# Patient Record
Sex: Female | Born: 1945 | Race: White | Hispanic: No | Smoking: Never smoker
Health system: Southern US, Community
[De-identification: ages and names within clinical notes are randomized; demographics above are authoritative.]

## PROBLEM LIST (undated history)

## (undated) DIAGNOSIS — Z923 Personal history of irradiation: Secondary | ICD-10-CM

## (undated) DIAGNOSIS — R0600 Dyspnea, unspecified: Secondary | ICD-10-CM

## (undated) DIAGNOSIS — S46129A Laceration of muscle, fascia and tendon of long head of biceps, unspecified arm, initial encounter: Secondary | ICD-10-CM

## (undated) DIAGNOSIS — E785 Hyperlipidemia, unspecified: Secondary | ICD-10-CM

## (undated) DIAGNOSIS — J189 Pneumonia, unspecified organism: Secondary | ICD-10-CM

## (undated) DIAGNOSIS — G7000 Myasthenia gravis without (acute) exacerbation: Secondary | ICD-10-CM

## (undated) DIAGNOSIS — C50919 Malignant neoplasm of unspecified site of unspecified female breast: Secondary | ICD-10-CM

## (undated) DIAGNOSIS — I10 Essential (primary) hypertension: Secondary | ICD-10-CM

## (undated) DIAGNOSIS — M810 Age-related osteoporosis without current pathological fracture: Secondary | ICD-10-CM

## (undated) DIAGNOSIS — R7303 Prediabetes: Secondary | ICD-10-CM

## (undated) DIAGNOSIS — M1990 Unspecified osteoarthritis, unspecified site: Secondary | ICD-10-CM

## (undated) HISTORY — DX: Age-related osteoporosis without current pathological fracture: M81.0

## (undated) HISTORY — PX: OTHER SURGICAL HISTORY: SHX169

## (undated) HISTORY — PX: BREAST SURGERY: SHX581

## (undated) HISTORY — PX: RESECTION OF A THYMOMA: SHX6214

## (undated) HISTORY — DX: Personal history of irradiation: Z92.3

---

## 1993-03-21 HISTORY — PX: MASTECTOMY: SHX3

## 1998-03-21 HISTORY — PX: BACK SURGERY: SHX140

## 2003-03-22 HISTORY — PX: ABDOMINAL HYSTERECTOMY: SHX81

## 2012-10-12 ENCOUNTER — Ambulatory Visit (INDEPENDENT_AMBULATORY_CARE_PROVIDER_SITE_OTHER): Payer: Worker's Compensation | Admitting: Family Medicine

## 2012-10-12 ENCOUNTER — Encounter: Payer: Self-pay | Admitting: Family Medicine

## 2012-10-12 VITALS — BP 139/79 | HR 84 | Ht 59.5 in | Wt 128.0 lb

## 2012-10-12 DIAGNOSIS — M810 Age-related osteoporosis without current pathological fracture: Secondary | ICD-10-CM

## 2012-10-12 DIAGNOSIS — L0291 Cutaneous abscess, unspecified: Secondary | ICD-10-CM

## 2012-10-12 DIAGNOSIS — L0390 Cellulitis, unspecified: Secondary | ICD-10-CM

## 2012-10-12 HISTORY — DX: Age-related osteoporosis without current pathological fracture: M81.0

## 2012-10-12 MED ORDER — SULFAMETHOXAZOLE-TRIMETHOPRIM 800-160 MG PO TABS: ORAL_TABLET | ORAL | Status: AC

## 2012-10-12 NOTE — Progress Notes (Signed)
CC: Faith Massey is a 67 y.o. female is here for Establish Care and Insect Bite   Subjective: HPI:   Pleasant 67 year old here to establish care  This is a workers compensation case pending case number and contact information  Patient complains of left foot pain that has been present ever since July 15. This occurred after she believes she was bit by a spider while at work. She describes a toothache pain that quickly developed within 24 hours after the bite. Pain at that time was severe it is slightly improved ibuprofen. She was started on cephalexin July 16 was taking this twice a day had mild improvement in pain however swelling and redness on the top of her foot did not appreciably get better or worse. She's concerned today that there may be an infection still present. Pain is slightly radiating to the top of her foot to her ankle there is overlying numbness where there is redness and swelling on the top of her foot that she describes as moderate in severity. It is worse with pressing on the clutch and walking, it is improved with rest.  Review of Systems - General ROS: negative for - chills, fever, night sweats, weight gain or weight loss Ophthalmic ROS: negative for - decreased vision Psychological ROS: negative for - anxiety or depression ENT ROS: negative for - hearing change, nasal congestion, tinnitus or allergies Hematological and Lymphatic ROS: negative for - bleeding problems, bruising or swollen lymph nodes Breast ROS: negative Respiratory ROS: no cough, shortness of breath, or wheezing Cardiovascular ROS: no chest pain or dyspnea on exertion Gastrointestinal ROS: no abdominal pain, change in bowel habits, or black or bloody stools Genito-Urinary ROS: negative for - genital discharge, genital ulcers, incontinence or abnormal bleeding from genitals Musculoskeletal ROS: negative for - joint pain or muscle pain other than that described above Neurological ROS: negative for - headaches  or memory loss Dermatological ROS: negative for lumps, mole changes, rash and skin lesion changes other than that described above  Past Medical History  Diagnosis Date  . Osteoporosis 10/12/2012     Family History  Problem Relation Age of Onset  . Stroke Mother   . Hypertension Mother   . Hypertension Father      History  Substance Use Topics  . Smoking status: Never Smoker   . Smokeless tobacco: Never Used  . Alcohol Use: Yes     Objective: Filed Vitals:   10/12/12 1401  BP: 139/79  Pulse: 84    General: Alert and Oriented, No Acute Distress HEENT: Pupils equal, round, reactive to light. Conjunctivae clear.  Moist mucous membranes pharynx unremarkable Lungs: Clear to auscultation bilaterally, no wheezing/ronchi/rales.  Comfortable work of breathing. Good air movement. Cardiac: Regular rate and rhythm. Normal S1/S2.  No murmurs, rubs, nor gallops.   Extremities: On the left foot There is mild edema and erythema on the plantar surface of the foot overlying the distal metatarsals there is also mild erythema and edema in all 5 digits . She has full passive range of motion in all toes extreme flexion of any of the toes causes mild discomfort in the skin overlying the distal metatarsals.  Strong peripheral pulses.  Mental Status: No depression, anxiety, nor agitation. Skin: Warm and dry.  Assessment & Plan: Faith Massey was seen today for establish care and insect bite.  Diagnoses and associated orders for this visit:  Cellulitis - sulfamethoxazole-trimethoprim (SEPTRA DS) 800-160 MG per tablet; One by mouth twice a day for ten days.  Other Orders - UNABLE TO FIND; Med Name: Zyflanmend - Cholecalciferol (VITAMIN D-3) 5000 UNITS TABS; Take 5,000 Units by mouth daily.    Cellulitis: I suspect she has infectious cellulitis likely staph or strep that was resistant to Keflex therefore start Bactrim. We discussed signs and symptoms that would be more suggestive of a developing abscess  or tendon sheath involvement that would require imaging, fortunately I do not think she has progressed to the stage we did discuss signs and symptoms that would require emergency room evaluation over the weekend especially if she's not improving by Sunday afternoon. Encouraged to take ibuprofen 800 mg 3 times a day and to go home and rest for the rest of the day  Return in about 1 week (around 10/19/2012).

## 2012-10-15 ENCOUNTER — Other Ambulatory Visit: Payer: Self-pay | Admitting: Physician Assistant

## 2012-10-15 ENCOUNTER — Ambulatory Visit (INDEPENDENT_AMBULATORY_CARE_PROVIDER_SITE_OTHER): Payer: Worker's Compensation | Admitting: Physician Assistant

## 2012-10-15 ENCOUNTER — Ambulatory Visit (HOSPITAL_BASED_OUTPATIENT_CLINIC_OR_DEPARTMENT_OTHER): Admission: RE | Admit: 2012-10-15 | Payer: Worker's Compensation | Source: Ambulatory Visit

## 2012-10-15 ENCOUNTER — Encounter: Payer: Self-pay | Admitting: Physician Assistant

## 2012-10-15 ENCOUNTER — Ambulatory Visit: Payer: 59

## 2012-10-15 ENCOUNTER — Ambulatory Visit (HOSPITAL_BASED_OUTPATIENT_CLINIC_OR_DEPARTMENT_OTHER)
Admission: RE | Admit: 2012-10-15 | Discharge: 2012-10-15 | Disposition: A | Discharge status: Home / Self Care | Payer: Worker's Compensation | Source: Ambulatory Visit | Attending: Physician Assistant | Admitting: Physician Assistant

## 2012-10-15 VITALS — BP 140/88 | HR 87 | Wt 125.0 lb

## 2012-10-15 DIAGNOSIS — S92322A Displaced fracture of second metatarsal bone, left foot, initial encounter for closed fracture: Secondary | ICD-10-CM

## 2012-10-15 DIAGNOSIS — L02619 Cutaneous abscess of unspecified foot: Secondary | ICD-10-CM | POA: Insufficient documentation

## 2012-10-15 DIAGNOSIS — W57XXXA Bitten or stung by nonvenomous insect and other nonvenomous arthropods, initial encounter: Secondary | ICD-10-CM

## 2012-10-15 DIAGNOSIS — L0390 Cellulitis, unspecified: Secondary | ICD-10-CM

## 2012-10-15 DIAGNOSIS — L03119 Cellulitis of unspecified part of limb: Secondary | ICD-10-CM | POA: Insufficient documentation

## 2012-10-15 DIAGNOSIS — S92309A Fracture of unspecified metatarsal bone(s), unspecified foot, initial encounter for closed fracture: Secondary | ICD-10-CM

## 2012-10-15 DIAGNOSIS — M79609 Pain in unspecified limb: Secondary | ICD-10-CM

## 2012-10-15 DIAGNOSIS — T6391XA Toxic effect of contact with unspecified venomous animal, accidental (unintentional), initial encounter: Secondary | ICD-10-CM | POA: Insufficient documentation

## 2012-10-15 DIAGNOSIS — T63391A Toxic effect of venom of other spider, accidental (unintentional), initial encounter: Secondary | ICD-10-CM | POA: Insufficient documentation

## 2012-10-15 DIAGNOSIS — X58XXXA Exposure to other specified factors, initial encounter: Secondary | ICD-10-CM | POA: Insufficient documentation

## 2012-10-15 DIAGNOSIS — R52 Pain, unspecified: Secondary | ICD-10-CM

## 2012-10-15 DIAGNOSIS — M79672 Pain in left foot: Secondary | ICD-10-CM

## 2012-10-15 DIAGNOSIS — L0291 Cutaneous abscess, unspecified: Secondary | ICD-10-CM

## 2012-10-15 NOTE — Progress Notes (Signed)
  Subjective:    Patient ID: Faith Massey, female    DOB: Nov 24, 1945, 67 y.o.   MRN: [id number]  HPI Patient is a 67 yo female who presents to the clinic to follow up on cellulitis from insect bite. The original insect bite was on the 15th of July. She went to Urgent care and was given keflex. It did not get better and saw Dr. Ivan Anchors on Friday, 3 days ago, He switched antibiotic to Septra. Pt has been on septra for 3 days and feels like redness is getting better but still in significant pain and swelling. She is concerned because it is not getting better. She also is having a lot of pain radiating up left leg. She has been taking some ibuprofen which is helping and septra. She has also tried to keep elevated. She went back to work today and was in so much pain decided to come into the office to get left foot looked at. She feels weak and tired.    Review of Systems     Objective:   Physical Exam  Constitutional: She is oriented to person, place, and time. She appears well-developed and well-nourished.  Cardiovascular: Normal rate, regular rhythm and normal heart sounds.   Pulmonary/Chest: Effort normal and breath sounds normal.  Musculoskeletal:  Left foot mild erythema. Minimal swelling over dorsum of foot. Moderate Tenderness to palpation over 2nd metatarsal. Strength 3/5 to plantar flexion. Strength 5/5 to dorsiflexion.   Per picture of foot on Friday appears to be improving.  Neurological: She is alert and oriented to person, place, and time.  Psychiatric: She has a normal mood and affect. Her behavior is normal.          Assessment & Plan:  Left foot cellulitis/insect bite/left foot pain- concerned because pt is still having so much pain especially with PE. Dr. Ivan Anchors was concerned about an Abscess and infection into tendons. I do think redness and swelling is better and pt does not want to get an MRI. I did convince her to start with xray and CBC with Differential. At this point I do  not want to change antibiotic but I might change my mind if CBC elevated. I did write out of work for next 3 days.   Xray revealed a transverse fracture on second metatarsal mid to distal location but anatomically inline. Pt was called and msg was left to call office and come in to get fitted for post op boot. Wear for 4 weeks and follow up in clinic with PCP. Continue to ice and elevate. Call with question or pain that continues. Finish septra.   Spent 30 minutes with patient and greater than 50 percent of visit spent counseling pt to get imaging to further investigate pain and potential causes if no further imaging done.

## 2012-10-16 ENCOUNTER — Ambulatory Visit (INDEPENDENT_AMBULATORY_CARE_PROVIDER_SITE_OTHER): Payer: Worker's Compensation | Admitting: Sports Medicine

## 2012-10-16 ENCOUNTER — Encounter: Payer: Self-pay | Admitting: Sports Medicine

## 2012-10-16 VITALS — BP 142/79 | HR 79 | Wt 126.0 lb

## 2012-10-16 DIAGNOSIS — M84475A Pathological fracture, left foot, initial encounter for fracture: Secondary | ICD-10-CM | POA: Insufficient documentation

## 2012-10-16 DIAGNOSIS — S92325A Nondisplaced fracture of second metatarsal bone, left foot, initial encounter for closed fracture: Secondary | ICD-10-CM

## 2012-10-16 DIAGNOSIS — M8448XA Pathological fracture, other site, initial encounter for fracture: Secondary | ICD-10-CM

## 2012-10-16 DIAGNOSIS — S92309A Fracture of unspecified metatarsal bone(s), unspecified foot, initial encounter for closed fracture: Secondary | ICD-10-CM

## 2012-10-16 HISTORY — DX: Pathological fracture, left foot, initial encounter for fracture: M84.475A

## 2012-10-16 LAB — CBC WITH DIFFERENTIAL/PLATELET
Hemoglobin: 14.2 g/dL (ref 12.0–15.0)
Lymphocytes Relative: 22 % (ref 12–46)
Lymphs Abs: 2.8 10*3/uL (ref 0.7–4.0)
MCH: 29.8 pg (ref 26.0–34.0)
Monocytes Relative: 7 % (ref 3–12)
Neutro Abs: 8.5 10*3/uL — ABNORMAL HIGH (ref 1.7–7.7)
Neutrophils Relative %: 68 % (ref 43–77)
Platelets: 338 10*3/uL (ref 150–400)
RBC: 4.76 MIL/uL (ref 3.87–5.11)
WBC: 12.4 10*3/uL — ABNORMAL HIGH (ref 4.0–10.5)

## 2012-10-16 LAB — COMPLETE METABOLIC PANEL WITH GFR
ALT: 15 U/L (ref 0–35)
Albumin: 4.7 g/dL (ref 3.5–5.2)
CO2: 27 mEq/L (ref 19–32)
Chloride: 105 mEq/L (ref 96–112)
GFR, Est African American: 65 mL/min
GFR, Est Non African American: 57 mL/min — ABNORMAL LOW
Glucose, Bld: 96 mg/dL (ref 70–99)
Potassium: 5.6 mEq/L — ABNORMAL HIGH (ref 3.5–5.3)
Sodium: 142 mEq/L (ref 135–145)
Total Bilirubin: 0.4 mg/dL (ref 0.3–1.2)
Total Protein: 7.2 g/dL (ref 6.0–8.3)

## 2012-10-16 MED ORDER — HYDROCODONE-ACETAMINOPHEN 5-325 MG PO TABS: 0.5000 | ORAL_TABLET | Freq: Three times a day (TID) | ORAL | Status: DC | PRN

## 2012-10-16 NOTE — Progress Notes (Signed)
   Subjective:    I'm seeing this patient as a consultation for:  Dr. Laren Boom and Tandy Gaw, PA-C  CC: Foot fracture  HPI: This is a very pleasant 67 year old female with a history of osteoporosis who has been very resistant to starting a bisphosphonate. Unfortunately she had an insect bite over the dorsum of her left foot, she had persistent pain, x-rays eventually showed a fracture through the shaft of the second metatarsal, this occurred without trauma. She came to see me to discuss the fracture and for definitive treatment. Pain is localized, doesn't radiate, moderate. She is able to bear weight without pain.  Past medical history, Surgical history, Family history not pertinant except as noted below, Social history, Allergies, and medications have been entered into the medical record, reviewed, and no changes needed.   Review of Systems: No headache, visual changes, nausea, vomiting, diarrhea, constipation, dizziness, abdominal pain, skin rash, fevers, chills, night sweats, weight loss, swollen lymph nodes, body aches, joint swelling, muscle aches, chest pain, shortness of breath, mood changes, visual or auditory hallucinations.   Objective:   General: Well Developed, well nourished, and in no acute distress.  Neuro/Psych: Alert and oriented x3, extra-ocular muscles intact, able to move all 4 extremities, sensation grossly intact. Skin: Warm and dry, no rashes noted.  Respiratory: Not using accessory muscles, speaking in full sentences, trachea midline.  Cardiovascular: Pulses palpable, no extremity edema. Abdomen: Does not appear distended. Left foot: There is tenderness to palpation over the mid second metatarsal shaft with palpable bony callus. She is neurovascularly intact distally.  Foot was strapped with compressive dressing.  X-rays are reviewed and show a nondisplaced, non-angulated fracture through the shaft of the second metatarsal.  Impression and Recommendations:     This case required medical decision making of moderate complexity.

## 2012-10-16 NOTE — Assessment & Plan Note (Signed)
This is a nontraumatic fracture likely related to concurrent osteoporosis. She is resistant to any bisphosphonate treatment, we certainly could consider Prolia. I would like her primary care provider to consider this in her treatment. Foot was strapped with compressive dressing, postop shoe. Low-dose hydrocodone. Return in 2 weeks, x-ray before visit.  I billed a fracture code for this visit, all subsequent visits for this complaint will be "post-op checks" in the global period.

## 2012-10-18 ENCOUNTER — Telehealth: Payer: Self-pay | Admitting: *Deleted

## 2012-10-18 NOTE — Telephone Encounter (Signed)
Pt called and states she has an overall general malaise feeling. She was very vague about her sxs. Pt is supposed to come in tomorrow to recheck her Potassium. Advised her that since her white count was a little elevated perhaps her body is still fighting an infection. She  could be  be experiencing the overall bad feeling if potassium is elevated as well.Advised to keep lab appt and once results come back will let her know what to do

## 2012-10-19 ENCOUNTER — Telehealth: Payer: Self-pay | Admitting: *Deleted

## 2012-10-19 DIAGNOSIS — L0291 Cutaneous abscess, unspecified: Secondary | ICD-10-CM

## 2012-10-19 DIAGNOSIS — L0390 Cellulitis, unspecified: Secondary | ICD-10-CM

## 2012-10-19 NOTE — Telephone Encounter (Signed)
labs

## 2012-10-19 NOTE — Telephone Encounter (Signed)
Yes. Need to check potassium.

## 2012-10-19 NOTE — Telephone Encounter (Signed)
Lab order was sent down earlier today

## 2012-10-20 LAB — COMPLETE METABOLIC PANEL WITH GFR
ALT: 14 U/L (ref 0–35)
CO2: 25 mEq/L (ref 19–32)
Chloride: 101 mEq/L (ref 96–112)
GFR, Est African American: 71 mL/min
Sodium: 137 mEq/L (ref 135–145)
Total Bilirubin: 0.4 mg/dL (ref 0.3–1.2)
Total Protein: 7.3 g/dL (ref 6.0–8.3)

## 2012-10-22 NOTE — Addendum Note (Signed)
Addended by: Monica Becton on: 10/22/2012 06:11 PM   Modules accepted: Level of Service

## 2012-10-30 ENCOUNTER — Ambulatory Visit (INDEPENDENT_AMBULATORY_CARE_PROVIDER_SITE_OTHER): Payer: 59 | Admitting: Sports Medicine

## 2012-10-30 ENCOUNTER — Encounter: Payer: Self-pay | Admitting: Sports Medicine

## 2012-10-30 ENCOUNTER — Ambulatory Visit (HOSPITAL_BASED_OUTPATIENT_CLINIC_OR_DEPARTMENT_OTHER)
Admission: RE | Admit: 2012-10-30 | Discharge: 2012-10-30 | Disposition: A | Discharge status: Home / Self Care | Payer: Worker's Compensation | Source: Ambulatory Visit | Attending: Physician Assistant | Admitting: Physician Assistant

## 2012-10-30 ENCOUNTER — Other Ambulatory Visit: Payer: Self-pay | Admitting: Physician Assistant

## 2012-10-30 ENCOUNTER — Ambulatory Visit (HOSPITAL_BASED_OUTPATIENT_CLINIC_OR_DEPARTMENT_OTHER): Admission: RE | Admit: 2012-10-30 | Payer: Worker's Compensation | Source: Ambulatory Visit

## 2012-10-30 VITALS — BP 126/73 | HR 82 | Wt 126.0 lb

## 2012-10-30 DIAGNOSIS — M810 Age-related osteoporosis without current pathological fracture: Secondary | ICD-10-CM

## 2012-10-30 DIAGNOSIS — L0291 Cutaneous abscess, unspecified: Secondary | ICD-10-CM

## 2012-10-30 DIAGNOSIS — L02619 Cutaneous abscess of unspecified foot: Secondary | ICD-10-CM | POA: Insufficient documentation

## 2012-10-30 DIAGNOSIS — M84475A Pathological fracture, left foot, initial encounter for fracture: Secondary | ICD-10-CM

## 2012-10-30 DIAGNOSIS — M8448XA Pathological fracture, other site, initial encounter for fracture: Secondary | ICD-10-CM

## 2012-10-30 DIAGNOSIS — X58XXXA Exposure to other specified factors, initial encounter: Secondary | ICD-10-CM | POA: Insufficient documentation

## 2012-10-30 DIAGNOSIS — S92309A Fracture of unspecified metatarsal bone(s), unspecified foot, initial encounter for closed fracture: Secondary | ICD-10-CM | POA: Insufficient documentation

## 2012-10-30 NOTE — Progress Notes (Signed)
  Subjective: 2 a half weeks status post minimally displaced fracture of the second metatarsal bone. Pain-free for the most part. She has been in a postop shoe, has a little difficulty operating the clutch.   Objective: General: Well-developed, well-nourished, and in no acute distress. Foot looks good, only minimal tenderness.  X-rays were reviewed and show excellent bony callus formation.  Assessment/plan:

## 2012-10-30 NOTE — Assessment & Plan Note (Signed)
She will discuss Prolia with her primary care provider. She is resistant to bisphosphonates due to a family member having a bad reaction.

## 2012-10-30 NOTE — Assessment & Plan Note (Signed)
Fractures healing well. I think she needs at least an additional 2 weeks and the postop shoe. Return to see me in 2 weeks, x-ray is not needed.

## 2012-11-14 ENCOUNTER — Encounter: Payer: Self-pay | Admitting: Sports Medicine

## 2012-11-14 ENCOUNTER — Ambulatory Visit: Payer: Worker's Compensation | Admitting: Sports Medicine

## 2012-11-14 VITALS — BP 146/92 | HR 89 | Wt 124.0 lb

## 2012-11-14 DIAGNOSIS — M84475A Pathological fracture, left foot, initial encounter for fracture: Secondary | ICD-10-CM

## 2012-11-14 NOTE — Assessment & Plan Note (Signed)
Clinically healed five-week status post fracture. Osteoporosis treatment per primary care provider. Return as needed.

## 2012-11-14 NOTE — Progress Notes (Signed)
  Subjective: Five-week status post pathologic fracture of the second metatarsal bone of the left foot, pain free.  This did occur with a misstep while at work.   Objective: General: Well-developed, well-nourished, and in no acute distress. Foot looks good, bony callus is palpable, no pain over the fracture site. Good movement, good sensation.  Assessment/plan:

## 2012-12-19 ENCOUNTER — Ambulatory Visit (INDEPENDENT_AMBULATORY_CARE_PROVIDER_SITE_OTHER): Payer: Worker's Compensation

## 2012-12-19 ENCOUNTER — Encounter: Payer: Self-pay | Admitting: Family Medicine

## 2012-12-19 ENCOUNTER — Ambulatory Visit (INDEPENDENT_AMBULATORY_CARE_PROVIDER_SITE_OTHER): Payer: Worker's Compensation | Admitting: Family Medicine

## 2012-12-19 VITALS — BP 135/91 | HR 86 | Wt 128.0 lb

## 2012-12-19 DIAGNOSIS — L0390 Cellulitis, unspecified: Secondary | ICD-10-CM

## 2012-12-19 DIAGNOSIS — M79672 Pain in left foot: Secondary | ICD-10-CM

## 2012-12-19 DIAGNOSIS — L0291 Cutaneous abscess, unspecified: Secondary | ICD-10-CM

## 2012-12-19 DIAGNOSIS — M79609 Pain in unspecified limb: Secondary | ICD-10-CM

## 2012-12-19 DIAGNOSIS — IMO0001 Reserved for inherently not codable concepts without codable children: Secondary | ICD-10-CM

## 2012-12-19 MED ORDER — DOXYCYCLINE HYCLATE 100 MG PO TABS: ORAL_TABLET | ORAL | Status: AC

## 2012-12-19 NOTE — Progress Notes (Signed)
CC: [name] is a 67 y.o. female is here for concerned about cellulitis in left foot   Subjective: HPI:  Patient complains of worsening left foot pain and swelling with redness on the dorsal aspect of the foot. Pain is described as absent at rest however moderate when flexing the foot or bearing weight. She localizes pain to the same site where she sustained a fracture last month while at work. She is most concerned about the swelling and redness that has been present since taking off her immobilizer last month and is now worsening on a weekly basis. She describes it is warm to the touch and moderately swollen all hours of the day. Nothing particularly makes it better or worse she denies recent trauma or overexertion. She denies fevers, chills, nausea, vomiting ankle pain nor plantar pain.   Review Of Systems Outlined In HPI  Past Medical History  Diagnosis Date  . Osteoporosis 10/12/2012     Family History  Problem Relation Age of Onset  . Stroke Mother   . Hypertension Mother   . Hypertension Father      History  Substance Use Topics  . Smoking status: Never Smoker   . Smokeless tobacco: Never Used  . Alcohol Use: Yes     Objective: Filed Vitals:   12/19/12 1332  BP: 135/91  Pulse: 86    General: Alert and Oriented, No Acute Distress HEENT: Pupils equal, round, reactive to light. Conjunctivae clear.  Moist mucous membranes Cardiac: Regular rate and rhythm. Normal S1/S2.  No murmurs, rubs, nor gallops.   Extremities:  Strong peripheral pulses. Overlying the midshaft of the second and third metatarsal on the left foot there is mild swelling and redness slightly warm to the touch pain is reproduced with palpating the dorsal midshaft of the third metatarsal, no pain in the toe box,  No pain with palpaton elsewhere in the left foot Mental Status: No depression, anxiety, nor agitation.   Assessment & Plan: Faith Massey was seen today for concerned about cellulitis in left  foot.  Diagnoses and associated orders for this visit:  Cellulitis - doxycycline (VIBRA-TABS) 100 MG tablet; One by mouth twice a day for ten days. - CBC w/Diff  Left foot pain - DG Foot Complete Left; Future    Left foot pain: Patient is quite concerned that cellulitis has returned I have a low to moderate suspicion given her presentation and no puncture to the skin, we will empirically start on doxycycline and if white count is not elevated will encourage her to stop. There was concern that there could be worsening of her healing fracture or a new third metatarsal fracture, fortunately this was ruled out on plain films today. If white count is normal we'll advise using a postop shoe to help with continued healing  Return if symptoms worsen or fail to improve.

## 2012-12-20 LAB — CBC WITH DIFFERENTIAL/PLATELET
Basophils Relative: 0 % (ref 0–1)
Eosinophils Absolute: 0.2 10*3/uL (ref 0.0–0.7)
Eosinophils Relative: 2 % (ref 0–5)
Hemoglobin: 13.1 g/dL (ref 12.0–15.0)
MCH: 29.6 pg (ref 26.0–34.0)
MCHC: 33.2 g/dL (ref 30.0–36.0)
Monocytes Absolute: 0.8 10*3/uL (ref 0.1–1.0)
Monocytes Relative: 8 % (ref 3–12)
Neutrophils Relative %: 66 % (ref 43–77)

## 2013-01-11 ENCOUNTER — Encounter: Payer: Self-pay | Admitting: Sports Medicine

## 2013-01-11 ENCOUNTER — Ambulatory Visit: Payer: Self-pay | Admitting: Sports Medicine

## 2013-01-11 ENCOUNTER — Ambulatory Visit (INDEPENDENT_AMBULATORY_CARE_PROVIDER_SITE_OTHER): Payer: Worker's Compensation | Admitting: Sports Medicine

## 2013-01-11 VITALS — BP 150/75 | HR 67

## 2013-01-11 DIAGNOSIS — M84475A Pathological fracture, left foot, initial encounter for fracture: Secondary | ICD-10-CM

## 2013-01-11 DIAGNOSIS — M8448XA Pathological fracture, other site, initial encounter for fracture: Secondary | ICD-10-CM

## 2013-01-11 NOTE — Progress Notes (Signed)
  Subjective:    CC: Follow up  HPI: Faith Massey is a very pleasant 67 year old female, however treating her in the past for second metatarsal shaft fracture of the left foot. She has also had what sounds to be multiple episodes of cellulitis treated with multiple rounds of antibiotics. With antibiotics the swelling resolves, but she continues to have mild pain over the dorsum of the foot, previously was over the second metatarsal shaft, today he localizes it mostly over the third metatarsal shaft. She did have an x-ray recently the results of which will be dictated below. Her main concern is wondering whether she should pursue advanced imaging now, or wait.  Past medical history, Surgical history, Family history not pertinant except as noted below, Social history, Allergies, and medications have been entered into the medical record, reviewed, and no changes needed.   Review of Systems: No fevers, chills, night sweats, weight loss, chest pain, or shortness of breath.   Objective:    General: Well Developed, well nourished, and in no acute distress.  Neuro: Alert and oriented x3, extra-ocular muscles intact, sensation grossly intact.  HEENT: Normocephalic, atraumatic, pupils equal round reactive to light, neck supple, no masses, no lymphadenopathy, thyroid nonpalpable.  Skin: Warm and dry, no rashes. Cardiac: Regular rate and rhythm, no murmurs rubs or gallops, no lower extremity edema.  Respiratory: Clear to auscultation bilaterally. Not using accessory muscles, speaking in full sentences. Left Foot: There is visible swelling over the dorsum of the midfoot, no erythema or induration. Range of motion is full in all directions. Strength is 5/5 in all directions. No hallux valgus. No pes cavus or pes planus. No abnormal callus noted. No pain over the navicular prominence, or base of fifth metatarsal. No tenderness to palpation of the calcaneal insertion of plantar fascia. No pain at the Achilles  insertion. No pain over the calcaneal bursa. No pain of the retrocalcaneal bursa. Minimal tenderness to palpation over the third metatarsal shaft, probable callus is present over the second metatarsal shaft there is no tenderness to palpation here. No hallux rigidus or limitus. No tenderness palpation over interphalangeal joints. No pain with compression of the metatarsal heads. Neurovascularly intact distally.  X-rays were reviewed and show excellent healing of the second metatarsal shaft fracture.  Impression and Recommendations:

## 2013-01-11 NOTE — Assessment & Plan Note (Signed)
Faith Massey is now several months status post fracture of the second metatarsal bone, excellent radiographic healing. Unfortunately she continues to have swelling over the dorsum of the foot with pain predominantly over the third metatarsal shaft. I do not think that the swelling is significantly abnormal, and I really don't think it represents a cellulitis. I did advise that she gets more time, at least another month, and if pain is persistent we should certainly consider CT of the foot with IV contrast to further delineate any collections that may be interfering with resolution with antibiotics, versus persistent or new bony injury of the third metatarsal shaft. At that point I would certainly also consider cast or cam boot immobilization for a month. She can come back to see [place] in one month, now we have a plan.

## 2013-02-08 ENCOUNTER — Ambulatory Visit: Payer: Self-pay | Admitting: Sports Medicine

## 2013-03-29 ENCOUNTER — Ambulatory Visit (INDEPENDENT_AMBULATORY_CARE_PROVIDER_SITE_OTHER): Payer: BC Managed Care – PPO | Admitting: Family Medicine

## 2013-03-29 ENCOUNTER — Encounter: Payer: Self-pay | Admitting: Family Medicine

## 2013-03-29 VITALS — BP 139/80 | HR 118 | Temp 99.4°F | Wt 125.0 lb

## 2013-03-29 DIAGNOSIS — A499 Bacterial infection, unspecified: Secondary | ICD-10-CM

## 2013-03-29 DIAGNOSIS — J329 Chronic sinusitis, unspecified: Secondary | ICD-10-CM

## 2013-03-29 DIAGNOSIS — B9689 Other specified bacterial agents as the cause of diseases classified elsewhere: Secondary | ICD-10-CM

## 2013-03-29 MED ORDER — AMOXICILLIN-POT CLAVULANATE 500-125 MG PO TABS: ORAL_TABLET | ORAL | Status: AC

## 2013-03-29 NOTE — Progress Notes (Signed)
CC: Faith Massey is a 68 y.o. female is here for Nasal Congestion   Subjective: HPI:  Patient claims one week of nasal congestion fatigue nonproductive cough and facial pressure beneath both eyes mostly in the left cheek radiated into the left upper molars. All the symptoms have been persistent worse in the morning slightly improved in the afternoon and overall moderate in severity. She's had a fever the past 2-3 days with a maximum temperature of 100.8. Has been using over-the-counter cold medication without much benefit. She also endorses diffuse body aches moderate in severity.  Denies confusion, motor sensory disturbances, chest pain, shortness of breath, nausea, vomiting nor rash or dysphagia   Review Of Systems Outlined In HPI  Past Medical History  Diagnosis Date  . Osteoporosis 10/12/2012     Family History  Problem Relation Age of Onset  . Stroke Mother   . Hypertension Mother   . Hypertension Father      History  Substance Use Topics  . Smoking status: Never Smoker   . Smokeless tobacco: Never Used  . Alcohol Use: Yes     Objective: Filed Vitals:   03/29/13 1437  BP: 139/80  Pulse: 118  Temp: 99.4 F (37.4 C)    General: Alert and Oriented, No Acute Distress however appears mildly fatigued HEENT: Pupils equal, round, reactive to light. Conjunctivae clear.  External ears unremarkable, canals clear with intact TMs with appropriate landmarks.  Middle ear appears open without effusion. Pink inferior turbinates.  Moist mucous membranes, pharynx without inflammation nor lesions.  Neck supple without palpable lymphadenopathy nor abnormal masses. Lungs: Clear to auscultation bilaterally, no wheezing/ronchi/rales.  Comfortable work of breathing. Good air movement. Cardiac: Regular rate and rhythm. Normal S1/S2.  No murmurs, rubs, nor gallops.   Mental Status: No depression, anxiety, nor agitation. Skin: Warm and dry.  Assessment & Plan: Faith Massey was seen today for nasal  congestion.  Diagnoses and associated orders for this visit:  Bacterial sinusitis - amoxicillin-clavulanate (AUGMENTIN) 500-125 MG per tablet; Take one by mouth every 8 hours for ten total days.    Bacterial sinusitis start Augmentin consider Alka-Seltzer cold and sinus and nasal saline washes for symptom control.  Return if symptoms worsen or fail to improve.

## 2013-05-06 ENCOUNTER — Ambulatory Visit (INDEPENDENT_AMBULATORY_CARE_PROVIDER_SITE_OTHER): Payer: BC Managed Care – PPO

## 2013-05-06 ENCOUNTER — Encounter: Payer: Self-pay | Admitting: Physician Assistant

## 2013-05-06 ENCOUNTER — Ambulatory Visit (INDEPENDENT_AMBULATORY_CARE_PROVIDER_SITE_OTHER): Payer: BC Managed Care – PPO | Admitting: Physician Assistant

## 2013-05-06 VITALS — BP 136/77 | HR 118 | Temp 99.0°F | Wt 124.0 lb

## 2013-05-06 DIAGNOSIS — R6889 Other general symptoms and signs: Secondary | ICD-10-CM

## 2013-05-06 DIAGNOSIS — R059 Cough, unspecified: Secondary | ICD-10-CM

## 2013-05-06 DIAGNOSIS — R05 Cough: Secondary | ICD-10-CM

## 2013-05-06 DIAGNOSIS — R509 Fever, unspecified: Secondary | ICD-10-CM

## 2013-05-06 DIAGNOSIS — J111 Influenza due to unidentified influenza virus with other respiratory manifestations: Secondary | ICD-10-CM

## 2013-05-06 DIAGNOSIS — R69 Illness, unspecified: Principal | ICD-10-CM

## 2013-05-06 DIAGNOSIS — R52 Pain, unspecified: Secondary | ICD-10-CM

## 2013-05-06 LAB — BASIC METABOLIC PANEL WITH GFR
BUN: 9 mg/dL (ref 6–23)
CHLORIDE: 102 meq/L (ref 96–112)
CO2: 25 mEq/L (ref 19–32)
Calcium: 9.1 mg/dL (ref 8.4–10.5)
Creat: 0.61 mg/dL (ref 0.50–1.10)
GFR, Est Non African American: >89 mL/min
GLUCOSE: 108 mg/dL — AB (ref 70–99)
POTASSIUM: 3.7 meq/L (ref 3.5–5.3)
SODIUM: 138 meq/L (ref 135–145)

## 2013-05-06 LAB — POCT INFLUENZA A/B
INFLUENZA B, POC: NEGATIVE
Influenza A, POC: NEGATIVE

## 2013-05-06 MED ORDER — HYDROCODONE-HOMATROPINE 5-1.5 MG/5ML PO SYRP: 5.0000 mL | ORAL_SOLUTION | Freq: Every evening | ORAL | Status: DC | PRN

## 2013-05-06 NOTE — Progress Notes (Signed)
   Subjective:    Patient ID: Faith Massey, female    DOB: Dec 01, 1945, 68 y.o.   MRN: [id number]  HPI Pt is a 68 yo female who presents to the clinic with 3 days of symptoms that started suddenly Saturday night. Coughing is not productive. She is running a temperature of 101 over past 2 days. Her whole body aches especially her upper right shoulder/scapula. She denies any wheezing or SOB. She does have a headache and sinus pressure, sore throat. No ear pain, nausea, diarrhea. She has not vomited. She is very weak and has chills off and on. Pt has no flu exposure. She already had flu once this year.    Review of Systems     Objective:   Physical Exam  Constitutional: She is oriented to person, place, and time. She appears well-developed and well-nourished.  Laying on exam table when came into room.   HENT:  Head: Normocephalic and atraumatic.  Right Ear: External ear normal.  Left Ear: External ear normal.  Nose: Nose normal.  Mouth/Throat: Oropharynx is clear and moist.  Continues to have moist mucosa.   TM's clear bilaterally.   Eyes: Conjunctivae are normal. Right eye exhibits no discharge. Left eye exhibits no discharge.  Neck: Normal range of motion. Neck supple.  Cardiovascular: Regular rhythm and normal heart sounds.   Tachycardia 118.   Pulmonary/Chest: Effort normal and breath sounds normal. She has no wheezes.  Abdominal: Soft. Bowel sounds are normal. There is no tenderness.  Lymphadenopathy:    She has no cervical adenopathy.  Neurological: She is alert and oriented to person, place, and time.  Skin:  Flushed cheeks.   Psychiatric: Her behavior is normal.          Assessment & Plan:  Influenza like illness- Influenza was negative. Pt exhibits many signs of the flu and looks like the picture of flu. I do think illness is viral. I would like to check BMP and CXR to make sure electrolytes are good and no pneumonia. Lung exam was good. Gave hycodan for cough. Encouraged  zyrtec D and mucinex. Concerned about dehydration encouraged pushing fluids. Tylenol and Advil for aches and pain. Call if worsening or not improving. REST and hydration for next 3 days. Wrote out of work for today and 2 more days.

## 2013-05-06 NOTE — Patient Instructions (Addendum)
Push fluids.  Will call with CXR and BMP results.  Zyrtec D and Mucinex.  Tylenol and ibuprofen.   Influenza, Adult Influenza ("the flu") is a viral infection of the respiratory tract. It occurs more often in winter months because people spend more time in close contact with one another. Influenza can make you feel very sick. Influenza easily spreads from person to person (contagious). CAUSES  Influenza is caused by a virus that infects the respiratory tract. You can catch the virus by breathing in droplets from an infected person's cough or sneeze. You can also catch the virus by touching something that was recently contaminated with the virus and then touching your mouth, nose, or eyes. SYMPTOMS  Symptoms typically last 4 to 10 days and may include:  Fever.  Chills.  Headache, body aches, and muscle aches.  Sore throat.  Chest discomfort and cough.  Poor appetite.  Weakness or feeling tired.  Dizziness.  Nausea or vomiting. DIAGNOSIS  Diagnosis of influenza is often made based on your history and a physical exam. A nose or throat swab test can be done to confirm the diagnosis. RISKS AND COMPLICATIONS You may be at risk for a more severe case of influenza if you smoke cigarettes, have diabetes, have chronic heart disease (such as heart failure) or lung disease (such as asthma), or if you have a weakened immune system. Elderly people and pregnant women are also at risk for more serious infections. The most common complication of influenza is a lung infection (pneumonia). Sometimes, this complication can require emergency medical care and may be life-threatening. PREVENTION  An annual influenza vaccination (flu shot) is the best way to avoid getting influenza. An annual flu shot is now routinely recommended for all adults in the [place] TREATMENT  In mild cases, influenza goes away on its own. Treatment is directed at relieving symptoms. For more severe cases, your caregiver may  prescribe antiviral medicines to shorten the sickness. Antibiotic medicines are not effective, because the infection is caused by a virus, not by bacteria. HOME CARE INSTRUCTIONS  Only take over-the-counter or prescription medicines for pain, discomfort, or fever as directed by your caregiver.  Use a cool mist humidifier to make breathing easier.  Get plenty of rest until your temperature returns to normal. This usually takes 3 to 4 days.  Drink enough fluids to keep your urine clear or pale yellow.  Cover your mouth and nose when coughing or sneezing, and wash your hands well to avoid spreading the virus.  Stay home from work or school until your fever has been gone for at least 1 full day. SEEK MEDICAL CARE IF:   You have chest pain or a deep cough that worsens or produces more mucus.  You have nausea, vomiting, or diarrhea. SEEK IMMEDIATE MEDICAL CARE IF:   You have difficulty breathing, shortness of breath, or your skin or nails turn bluish.  You have severe neck pain or stiffness.  You have a severe headache, facial pain, or earache.  You have a worsening or recurring fever.  You have nausea or vomiting that cannot be controlled. MAKE SURE YOU:  Understand these instructions.  Will watch your condition.  Will get help right away if you are not doing well or get worse. Document Released: 03/04/2000 Document Revised: 09/06/2011 Document Reviewed: 06/06/2011 [facility] Patient Information 2014 [place], [place].

## 2013-05-08 ENCOUNTER — Encounter: Payer: Self-pay | Admitting: *Deleted

## 2013-05-08 ENCOUNTER — Other Ambulatory Visit: Payer: Self-pay | Admitting: Physician Assistant

## 2013-05-08 MED ORDER — AZITHROMYCIN 250 MG PO TABS: ORAL_TABLET | ORAL | Status: DC

## 2013-05-08 MED ORDER — BENZONATATE 200 MG PO CAPS: 200.0000 mg | ORAL_CAPSULE | Freq: Two times a day (BID) | ORAL | Status: DC | PRN

## 2013-05-10 ENCOUNTER — Encounter: Payer: Self-pay | Admitting: *Deleted

## 2014-07-28 IMAGING — CR DG FOOT COMPLETE 3+V*L*
3 series · 3 of 3 positions shown · non-contrast
Comparison: 10/15/2012

CLINICAL DATA: Cellulitis.  Abscess.

LEFT FOOT - COMPLETE 3+ VIEW

[t foot ap left]
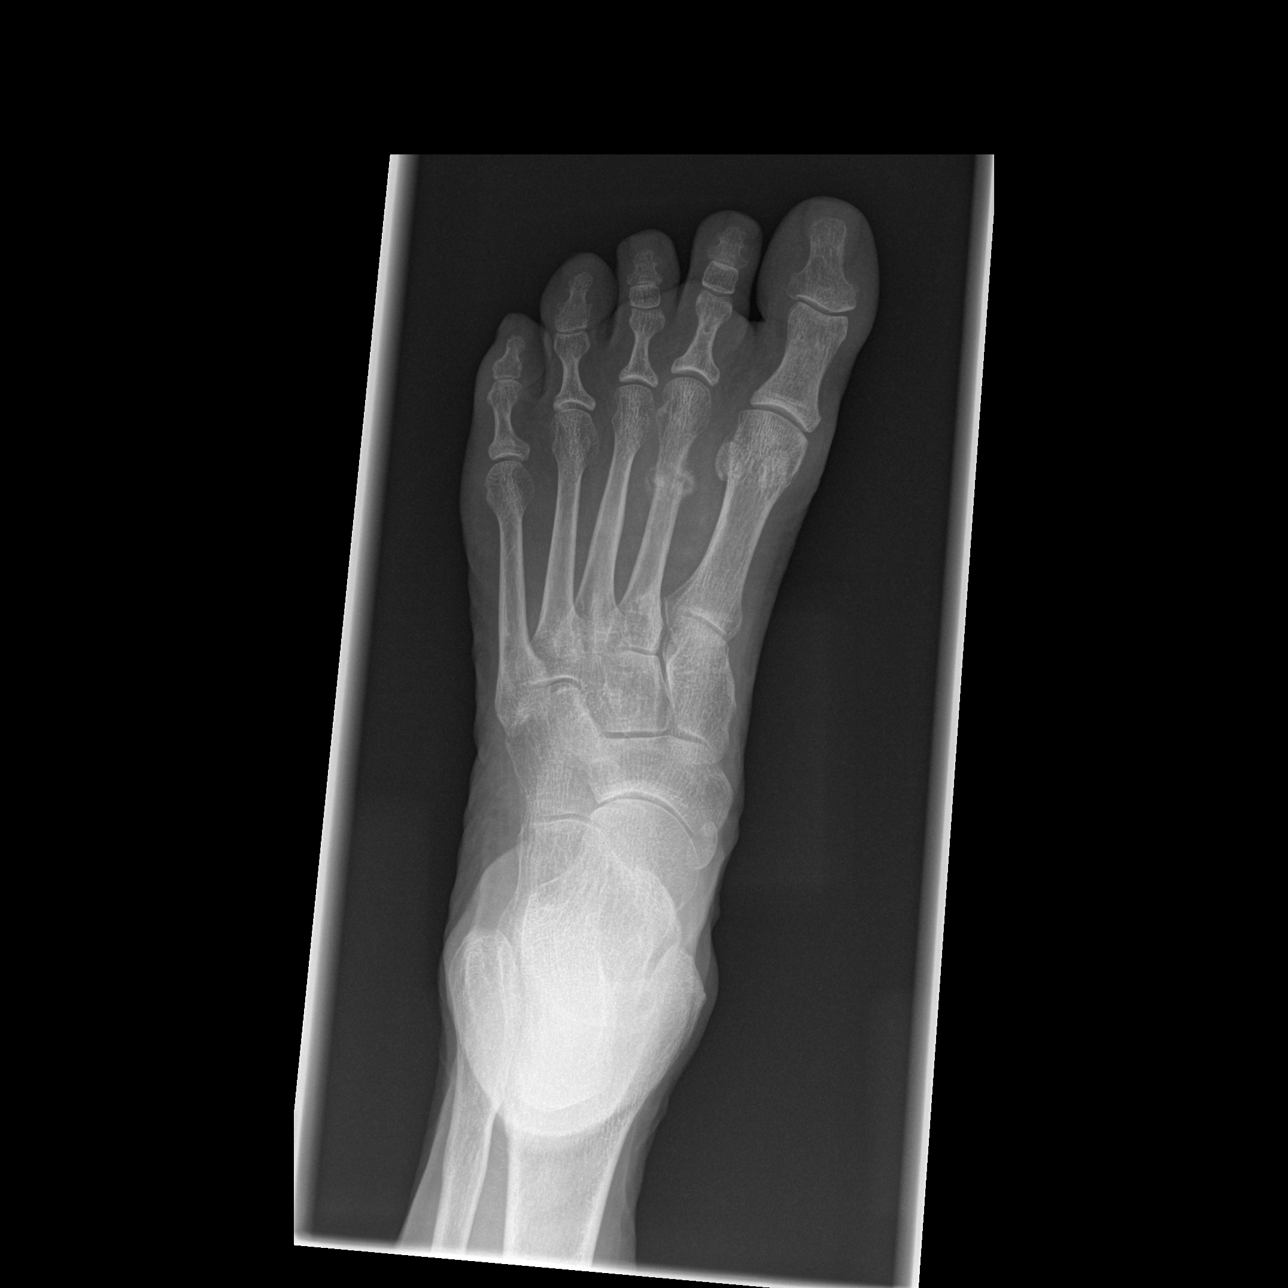

[t foot oblique left]
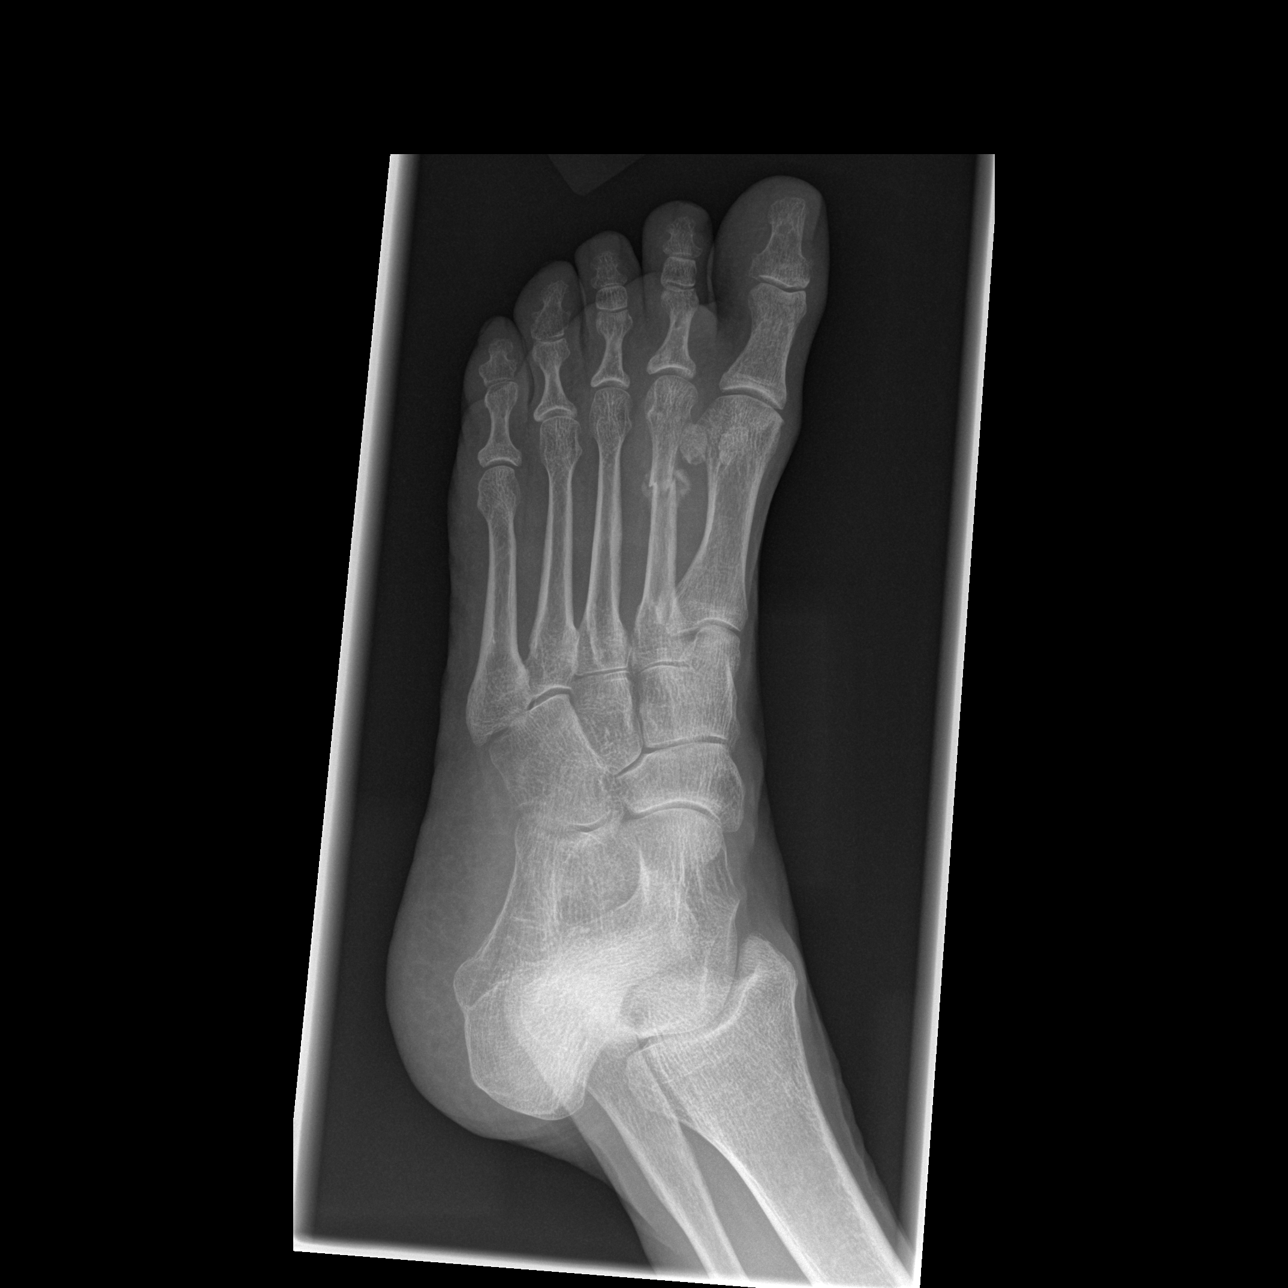

[t foot lat left]
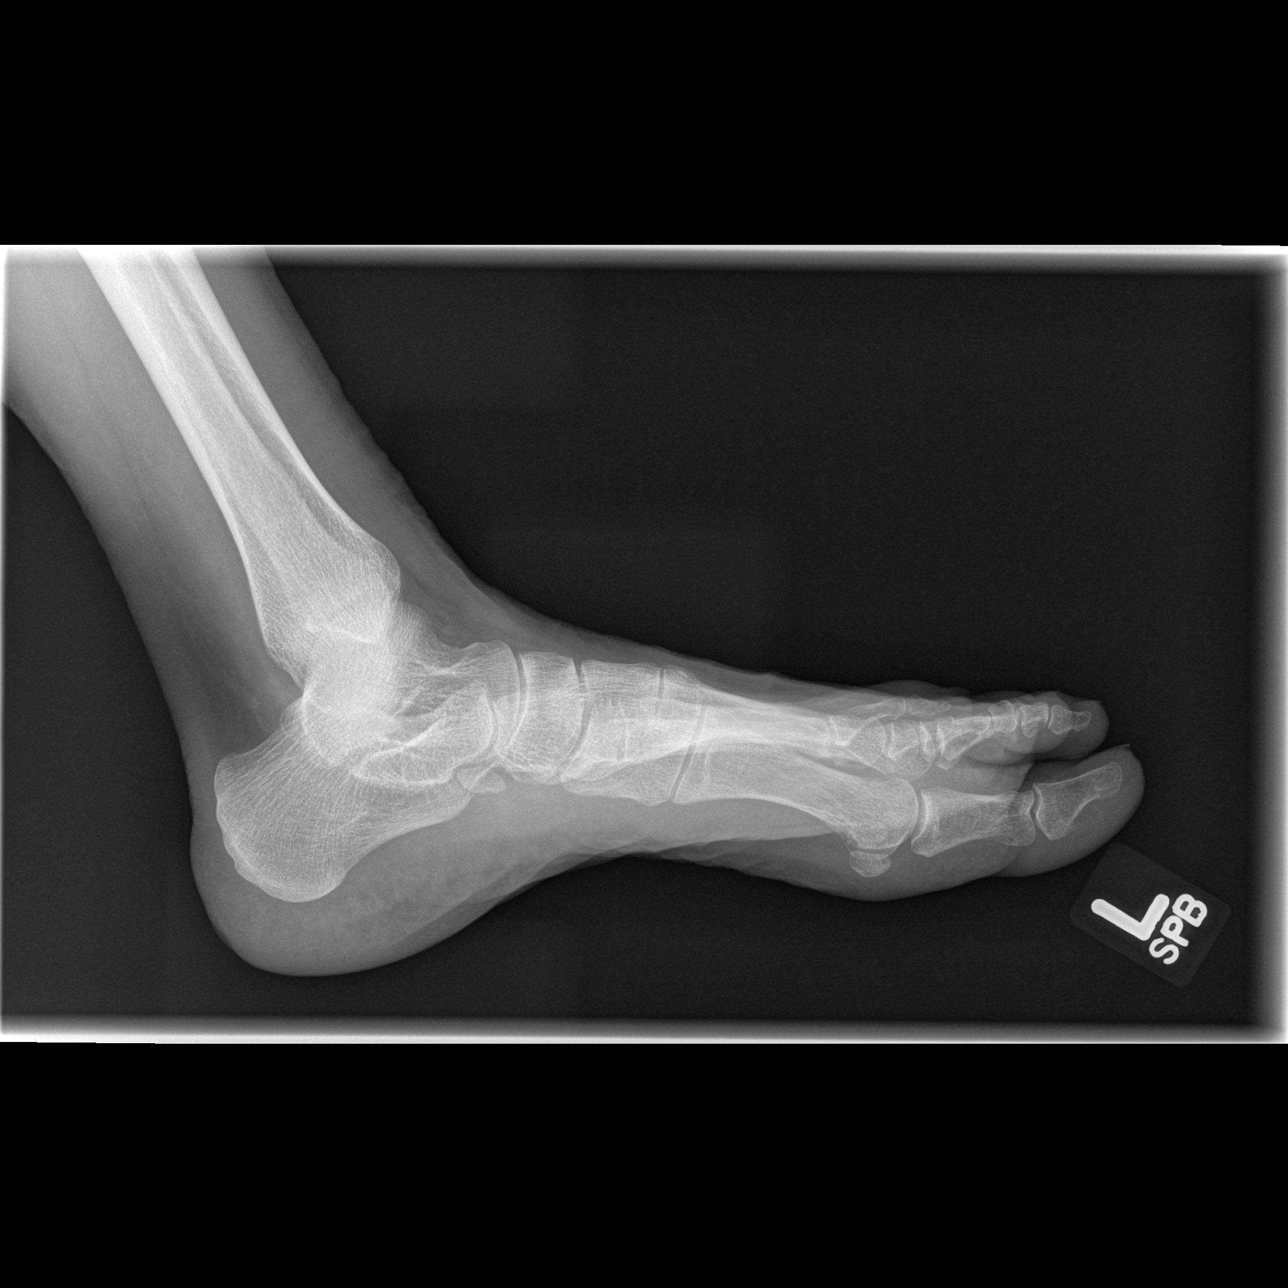

[3 of 3 positions shown; findings below may reference images not displayed]

FINDINGS: Increased osteoid deposition along the transverse
fracture of the distal second metatarsal shaft noted.  Mild
sclerosis medially in the proximal second metatarsal metaphysis.

Type 2 accessory navicular.  No malalignment at the Lisfranc joint.
IMPRESSION: 1.  Increased osteoid deposition around the second metatarsal
fracture compatible with early healing response.

## 2015-02-01 IMAGING — CR DG CHEST 2V
2 series · 2 of 2 positions shown · non-contrast
Comparison: None.

CLINICAL DATA: Cough and fever.

EXAM:
CHEST  2 VIEW

[view not recorded (1 of 2)]
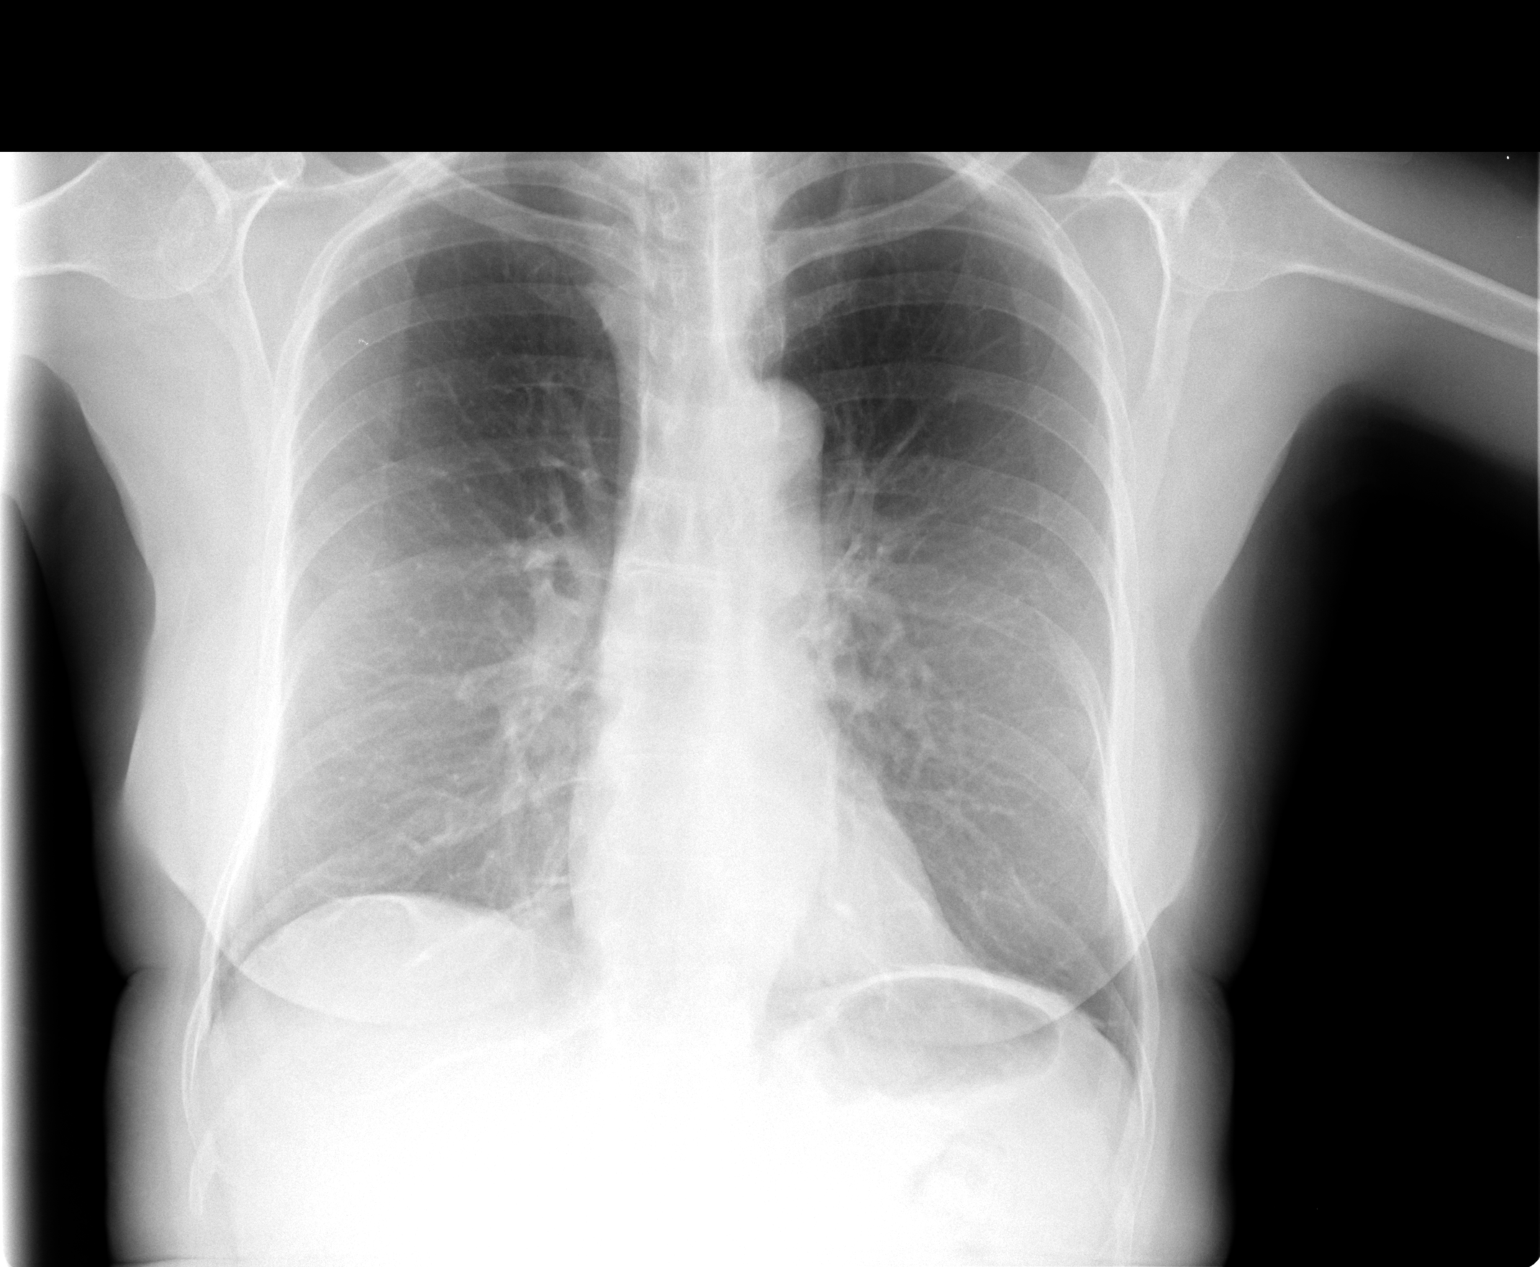

[view not recorded (2 of 2)]
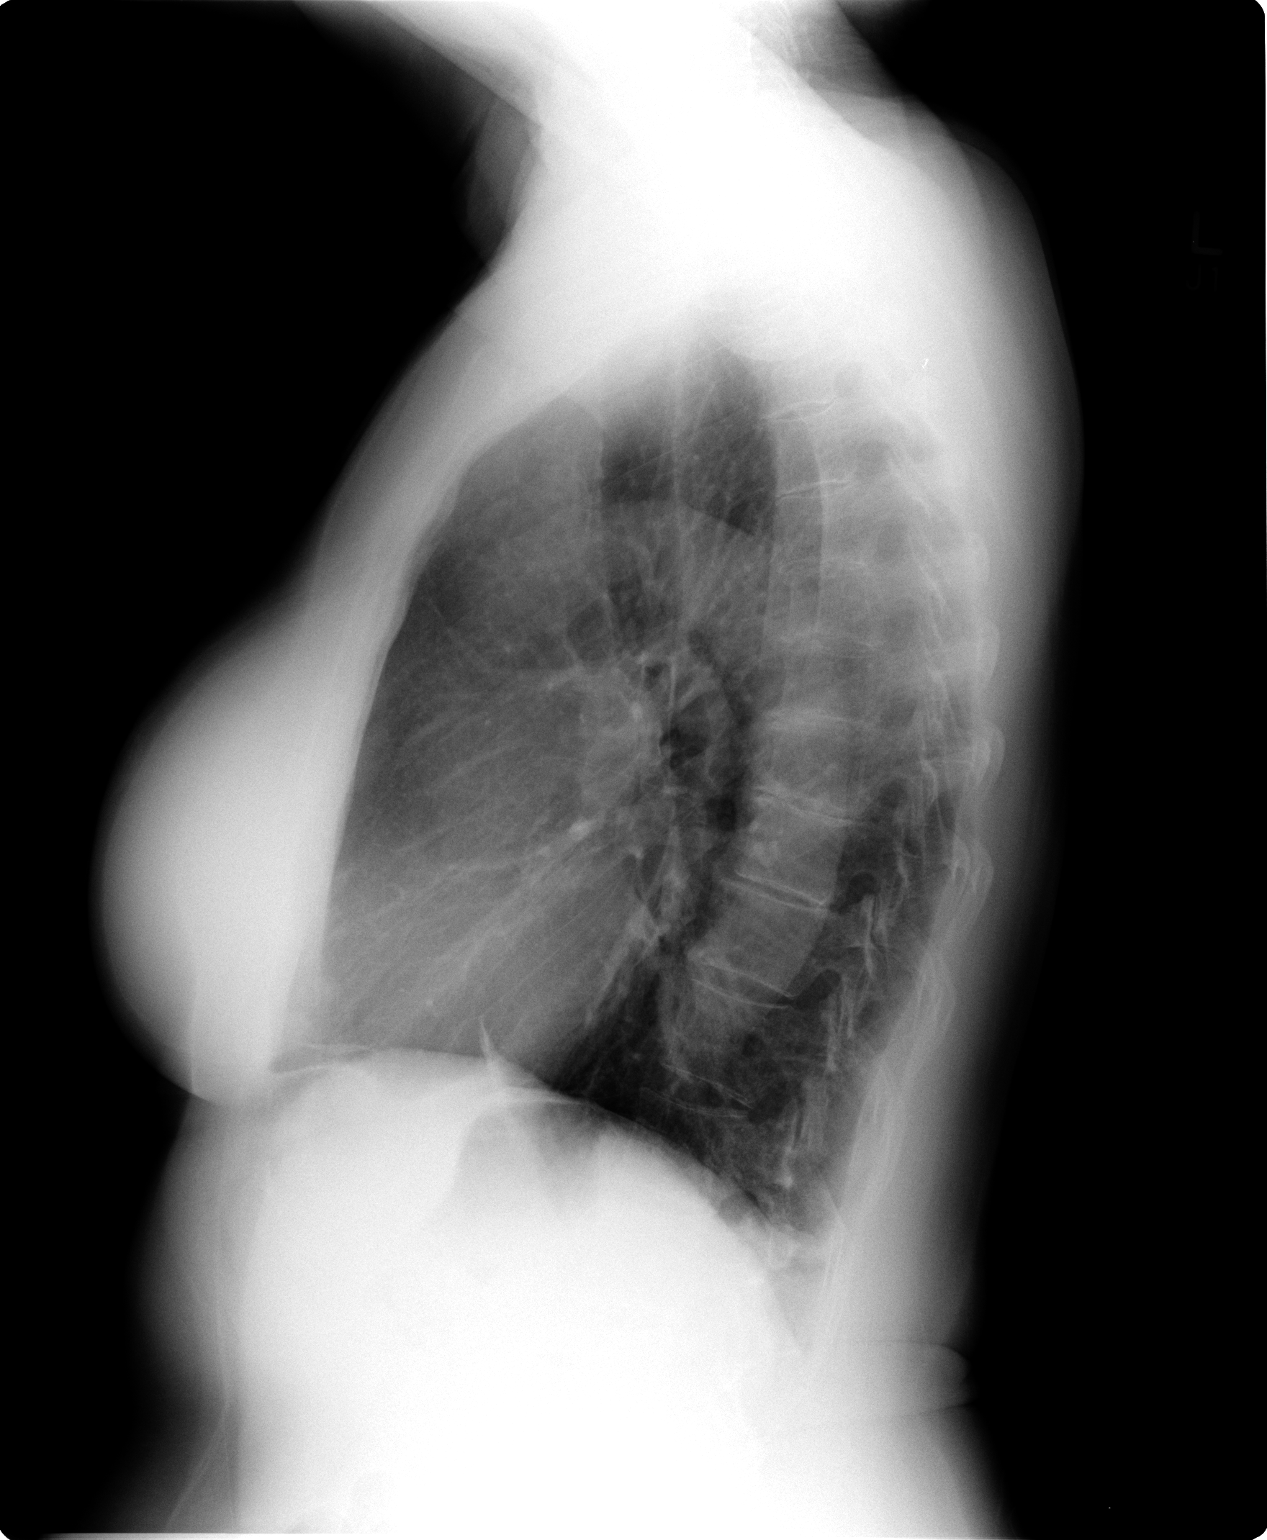

[2 of 2 positions shown; findings below may reference images not displayed]

FINDINGS: The heart size and mediastinal contours are within normal limits.
Both lungs are clear. Minimal spondylosis of the spine.
IMPRESSION: No active cardiopulmonary disease.

## 2017-01-18 DIAGNOSIS — Z6824 Body mass index (BMI) 24.0-24.9, adult: Secondary | ICD-10-CM | POA: Diagnosis not present

## 2017-01-18 DIAGNOSIS — Z0000 Encounter for general adult medical examination without abnormal findings: Secondary | ICD-10-CM | POA: Diagnosis not present

## 2017-02-13 ENCOUNTER — Ambulatory Visit (HOSPITAL_BASED_OUTPATIENT_CLINIC_OR_DEPARTMENT_OTHER)
Admission: RE | Admit: 2017-02-13 | Discharge: 2017-02-13 | Disposition: A | Discharge status: Home / Self Care | Payer: Medicare HMO | Source: Ambulatory Visit | Attending: Family Medicine | Admitting: Family Medicine

## 2017-02-13 ENCOUNTER — Ambulatory Visit: Payer: Self-pay | Admitting: Family Medicine

## 2017-02-13 ENCOUNTER — Encounter: Payer: Self-pay | Admitting: Family Medicine

## 2017-02-13 ENCOUNTER — Ambulatory Visit (INDEPENDENT_AMBULATORY_CARE_PROVIDER_SITE_OTHER): Payer: Medicare HMO | Admitting: Family Medicine

## 2017-02-13 VITALS — BP 177/91 | HR 81 | Ht 60.0 in | Wt 121.0 lb

## 2017-02-13 DIAGNOSIS — S8991XA Unspecified injury of right lower leg, initial encounter: Secondary | ICD-10-CM | POA: Diagnosis not present

## 2017-02-13 DIAGNOSIS — M25561 Pain in right knee: Secondary | ICD-10-CM

## 2017-02-13 NOTE — Patient Instructions (Signed)
You have strained your pes tendon. Ice the area 15 minutes at a time 3-4 times a day. Continue aspirin as you have been - I'd use for 7-10 more days regularly then as needed. Consider compression sleeve if this feels more comfortable to help with swelling and support. Straight leg raises, knee extensions, hamstring curls, and the hacky sack exercise 3 sets of 10 once a day. Add ankle weight if these become too easy. Follow up with me in 5-6 weeks. This should take about 6-8 weeks to completely resolve. Let me know if you want to start physical therapy also.

## 2017-02-14 ENCOUNTER — Encounter: Payer: Self-pay | Admitting: Family Medicine

## 2017-02-14 DIAGNOSIS — S8991XA Unspecified injury of right lower leg, initial encounter: Secondary | ICD-10-CM

## 2017-02-14 HISTORY — DX: Unspecified injury of right lower leg, initial encounter: S89.91XA

## 2017-02-14 NOTE — Assessment & Plan Note (Signed)
independently reviewed radiographs and no evidence fracture.  Consistent with strain of pes tendons at insertion.  Icing, shown home exercises to do daily.  Consider compression sleeve.  Aspirin for pain and inflammation.  F/u in 5-6 weeks.  Consider physical therapy.

## 2017-02-14 NOTE — Progress Notes (Signed)
PCP: Patient, No Pcp Per  Subjective:   HPI: Patient is a 71 y.o. female here for right knee pain.  Patient reports she's had two separate incidents with her right knee. About a month ago she slipped on a piece of plastic, twisted and landed onto her right shoulder. She improved after this then on 11/16 she was walking to her car at the dealership and stepped wrong on elevated pavement and felt sharp anterior right knee pain. Localized swelling. Pain intense, sharp, 6/10 level now. Pain worse with walking. Was posterior but no mainly anteromedial. No skin changes, numbness.  Past Medical History:  Diagnosis Date  . Osteoporosis 10/12/2012    Current Outpatient Medications on File Prior to Visit  Medication Sig Dispense Refill  . azithromycin (ZITHROMAX) 250 MG tablet Take 2 tablets now and then one tablet for 4 days. 6 tablet 0  . benzonatate (TESSALON) 200 MG capsule Take 1 capsule (200 mg total) by mouth 2 (two) times daily as needed for cough. 20 capsule 0  . Cholecalciferol (VITAMIN D-3) 5000 UNITS TABS Take 5,000 Units by mouth daily.    Marland Kitchen HYDROcodone-homatropine (HYCODAN) 5-1.5 MG/5ML syrup Take 5 mLs by mouth at bedtime as needed for cough. 120 mL 0  . UNABLE TO FIND Med Name: Zyflanmend     No current facility-administered medications on file prior to visit.     Past Surgical History:  Procedure Laterality Date  . ABDOMINAL HYSTERECTOMY  2005  . BACK SURGERY  2000  . MASTECTOMY Bilateral 1995    No Known Allergies  Social History   Socioeconomic History  . Marital status: Divorced    Spouse name: Not on file  . Number of children: Not on file  . Years of education: Not on file  . Highest education level: Not on file  Social Needs  . Financial resource strain: Not on file  . Food insecurity - worry: Not on file  . Food insecurity - inability: Not on file  . Transportation needs - medical: Not on file  . Transportation needs - non-medical: Not on file   Occupational History  . Not on file  Tobacco Use  . Smoking status: Never Smoker  . Smokeless tobacco: Never Used  Substance and Sexual Activity  . Alcohol use: Yes  . Drug use: No  . Sexual activity: Not on file  Other Topics Concern  . Not on file  Social History Narrative  . Not on file    Family History  Problem Relation Age of Onset  . Stroke Mother   . Hypertension Mother   . Hypertension Father     BP (!) 177/91   Pulse 81   Ht 5' (1.524 m)   Wt 121 lb (54.9 kg)   BMI 23.63 kg/m   Review of Systems: See HPI above.     Objective:  Physical Exam:  Gen: NAD, comfortable in exam room  Right knee: Mild localized swelling over pes bursa.  No other deformity, ecchymoses.  No effusion. TTP over pes bursa.  No other tenderness. FROM with pain on sartorius testing and resisted knee flexion. Negative ant/post drawers. Negative valgus/varus testing. Negative lachmanns. Negative mcmurrays, apleys, patellar apprehension. NV intact distally.  Left knee: No gross deformity, ecchymoses, swelling. No TTP. FROM with full strength. Negative ant/post drawers. Negative valgus/varus testing. Negative lachmanns. Negative mcmurrays, apleys, patellar apprehension. NV intact distally.   Assessment & Plan:  1. Right knee injury - independently reviewed radiographs and no evidence fracture.  Consistent with strain of pes tendons at insertion.  Icing, shown home exercises to do daily.  Consider compression sleeve.  Aspirin for pain and inflammation.  F/u in 5-6 weeks.  Consider physical therapy.

## 2017-03-20 ENCOUNTER — Ambulatory Visit (INDEPENDENT_AMBULATORY_CARE_PROVIDER_SITE_OTHER): Payer: Medicare HMO | Admitting: Family Medicine

## 2017-03-20 ENCOUNTER — Encounter: Payer: Self-pay | Admitting: Family Medicine

## 2017-03-20 DIAGNOSIS — S8991XD Unspecified injury of right lower leg, subsequent encounter: Secondary | ICD-10-CM

## 2017-03-20 NOTE — Patient Instructions (Signed)
You have strained your pes tendon. Ice the area 15 minutes at a time 3-4 times a day as needed now Aspirin only if needed. Compression sleeve if needed for swelling. Do home exercises for another 4-6 weeks though (hacky sack, knee extensions, and hamstring curls 3 sets of 10 once a day). Follow up with me as needed.

## 2017-03-20 NOTE — Progress Notes (Signed)
PCP: Patient, No Pcp Per  Subjective:   HPI: Patient is a 71 y.o. female here for right knee pain.  11/26: Patient reports she's had two separate incidents with her right knee. About a month ago she slipped on a piece of plastic, twisted and landed onto her right shoulder. She improved after this then on 11/16 she was walking to her car at the dealership and stepped wrong on elevated pavement and felt sharp anterior right knee pain. Localized swelling. Pain intense, sharp, 6/10 level now. Pain worse with walking. Was posterior but no mainly anteromedial. No skin changes, numbness.  12/31: Patient reports she's doing better. Getting some swelling in right knee down to ankle when on feet a lot. Using compression sleeve. Pain level down to 0/10. Pain can be sharp at times medially. No skin changes, numbness.  Past Medical History:  Diagnosis Date  . Osteoporosis 10/12/2012    Current Outpatient Medications on File Prior to Visit  Medication Sig Dispense Refill  . azithromycin (ZITHROMAX) 250 MG tablet Take 2 tablets now and then one tablet for 4 days. 6 tablet 0  . benzonatate (TESSALON) 200 MG capsule Take 1 capsule (200 mg total) by mouth 2 (two) times daily as needed for cough. 20 capsule 0  . Cholecalciferol (VITAMIN D-3) 5000 UNITS TABS Take 5,000 Units by mouth daily.    Marland Kitchen HYDROcodone-homatropine (HYCODAN) 5-1.5 MG/5ML syrup Take 5 mLs by mouth at bedtime as needed for cough. 120 mL 0  . UNABLE TO FIND Med Name: Zyflanmend     No current facility-administered medications on file prior to visit.     Past Surgical History:  Procedure Laterality Date  . ABDOMINAL HYSTERECTOMY  2005  . BACK SURGERY  2000  . MASTECTOMY Bilateral 1995    No Known Allergies  Social History   Socioeconomic History  . Marital status: Divorced    Spouse name: Not on file  . Number of children: Not on file  . Years of education: Not on file  . Highest education level: Not on file   Social Needs  . Financial resource strain: Not on file  . Food insecurity - worry: Not on file  . Food insecurity - inability: Not on file  . Transportation needs - medical: Not on file  . Transportation needs - non-medical: Not on file  Occupational History  . Not on file  Tobacco Use  . Smoking status: Never Smoker  . Smokeless tobacco: Never Used  Substance and Sexual Activity  . Alcohol use: Yes  . Drug use: No  . Sexual activity: Not on file  Other Topics Concern  . Not on file  Social History Narrative  . Not on file    Family History  Problem Relation Age of Onset  . Stroke Mother   . Hypertension Mother   . Hypertension Father     BP (!) 132/95   Pulse 82   Ht 5' (1.524 m)   Wt 121 lb (54.9 kg)   BMI 23.63 kg/m   Review of Systems: See HPI above.     Objective:  Physical Exam:  Gen: NAD, comfortable in exam room.  Right knee: No swelling, bruising, deformity. Mild TTP over pes bursa.  No other tenderness. FROM with mild pain on sartorius testing only.  5/5 strength. 1+ ant drawer, negative post drawer. Negative valgus/varus testing. More laxity with lachmanns than on left. Negative mcmurrays, apleys, patellar apprehension. NV intact distally.   Assessment & Plan:  1. Right  knee injury - Radiographs negative.  Main issue is pes tendon strain though today's exam conccerning that she has also injured her ACL.  She's not having instability and remainder of exam is reassuring.  Advised if this is torn I'd still recommend conservative treatment, focus on strengthening.  Icing, compression sleeve and aspirin if needed.  F/u prn.

## 2017-03-20 NOTE — Assessment & Plan Note (Signed)
Radiographs negative.  Main issue is pes tendon strain though today's exam conccerning that she has also injured her ACL.  She's not having instability and remainder of exam is reassuring.  Advised if this is torn I'd still recommend conservative treatment, focus on strengthening.  Icing, compression sleeve and aspirin if needed.  F/u prn.

## 2017-11-09 DIAGNOSIS — Z853 Personal history of malignant neoplasm of breast: Secondary | ICD-10-CM | POA: Diagnosis not present

## 2017-11-09 DIAGNOSIS — Z87891 Personal history of nicotine dependence: Secondary | ICD-10-CM | POA: Diagnosis not present

## 2019-02-19 ENCOUNTER — Other Ambulatory Visit: Payer: Self-pay | Admitting: Family Medicine

## 2019-02-19 DIAGNOSIS — Z1231 Encounter for screening mammogram for malignant neoplasm of breast: Secondary | ICD-10-CM

## 2022-03-28 ENCOUNTER — Inpatient Hospital Stay (HOSPITAL_COMMUNITY): Payer: Medicare HMO

## 2022-03-28 ENCOUNTER — Encounter (HOSPITAL_BASED_OUTPATIENT_CLINIC_OR_DEPARTMENT_OTHER): Payer: Self-pay | Admitting: Emergency Medicine

## 2022-03-28 ENCOUNTER — Other Ambulatory Visit: Payer: Self-pay

## 2022-03-28 ENCOUNTER — Emergency Department (HOSPITAL_COMMUNITY): Payer: Medicare HMO

## 2022-03-28 ENCOUNTER — Inpatient Hospital Stay (HOSPITAL_BASED_OUTPATIENT_CLINIC_OR_DEPARTMENT_OTHER)
Admission: EM | Admit: 2022-03-28 | Discharge: 2022-03-30 | DRG: 066 | Disposition: A | Discharge status: Home / Self Care | Payer: Medicare HMO | Attending: Internal Medicine | Admitting: Internal Medicine

## 2022-03-28 ENCOUNTER — Emergency Department (HOSPITAL_BASED_OUTPATIENT_CLINIC_OR_DEPARTMENT_OTHER): Payer: Medicare HMO

## 2022-03-28 DIAGNOSIS — R222 Localized swelling, mass and lump, trunk: Secondary | ICD-10-CM

## 2022-03-28 DIAGNOSIS — R29810 Facial weakness: Secondary | ICD-10-CM | POA: Diagnosis not present

## 2022-03-28 DIAGNOSIS — R4781 Slurred speech: Secondary | ICD-10-CM | POA: Diagnosis present

## 2022-03-28 DIAGNOSIS — I6389 Other cerebral infarction: Secondary | ICD-10-CM | POA: Diagnosis not present

## 2022-03-28 DIAGNOSIS — I10 Essential (primary) hypertension: Secondary | ICD-10-CM

## 2022-03-28 DIAGNOSIS — Z9013 Acquired absence of bilateral breasts and nipples: Secondary | ICD-10-CM | POA: Diagnosis not present

## 2022-03-28 DIAGNOSIS — R29700 NIHSS score 0: Secondary | ICD-10-CM | POA: Diagnosis not present

## 2022-03-28 DIAGNOSIS — Z823 Family history of stroke: Secondary | ICD-10-CM | POA: Diagnosis not present

## 2022-03-28 DIAGNOSIS — R918 Other nonspecific abnormal finding of lung field: Secondary | ICD-10-CM | POA: Diagnosis not present

## 2022-03-28 DIAGNOSIS — J9811 Atelectasis: Secondary | ICD-10-CM | POA: Diagnosis not present

## 2022-03-28 DIAGNOSIS — R29818 Other symptoms and signs involving the nervous system: Secondary | ICD-10-CM | POA: Diagnosis not present

## 2022-03-28 DIAGNOSIS — Z79899 Other long term (current) drug therapy: Secondary | ICD-10-CM

## 2022-03-28 DIAGNOSIS — H02401 Unspecified ptosis of right eyelid: Secondary | ICD-10-CM | POA: Diagnosis present

## 2022-03-28 DIAGNOSIS — Z8249 Family history of ischemic heart disease and other diseases of the circulatory system: Secondary | ICD-10-CM

## 2022-03-28 DIAGNOSIS — M810 Age-related osteoporosis without current pathological fracture: Secondary | ICD-10-CM | POA: Diagnosis present

## 2022-03-28 DIAGNOSIS — I63311 Cerebral infarction due to thrombosis of right middle cerebral artery: Secondary | ICD-10-CM

## 2022-03-28 DIAGNOSIS — R490 Dysphonia: Secondary | ICD-10-CM | POA: Diagnosis present

## 2022-03-28 DIAGNOSIS — I639 Cerebral infarction, unspecified: Secondary | ICD-10-CM

## 2022-03-28 DIAGNOSIS — R479 Unspecified speech disturbances: Secondary | ICD-10-CM | POA: Diagnosis not present

## 2022-03-28 DIAGNOSIS — I6381 Other cerebral infarction due to occlusion or stenosis of small artery: Secondary | ICD-10-CM | POA: Diagnosis not present

## 2022-03-28 DIAGNOSIS — J9859 Other diseases of mediastinum, not elsewhere classified: Secondary | ICD-10-CM | POA: Diagnosis not present

## 2022-03-28 DIAGNOSIS — Z853 Personal history of malignant neoplasm of breast: Secondary | ICD-10-CM | POA: Diagnosis not present

## 2022-03-28 DIAGNOSIS — E785 Hyperlipidemia, unspecified: Secondary | ICD-10-CM | POA: Diagnosis not present

## 2022-03-28 HISTORY — DX: Essential (primary) hypertension: I10

## 2022-03-28 HISTORY — DX: Cerebral infarction, unspecified: I63.9

## 2022-03-28 HISTORY — DX: Localized swelling, mass and lump, trunk: R22.2

## 2022-03-28 LAB — URINALYSIS, COMPLETE (UACMP) WITH MICROSCOPIC
Bacteria, UA: NONE SEEN
Bilirubin Urine: NEGATIVE
Glucose, UA: NEGATIVE mg/dL
Hgb urine dipstick: NEGATIVE
Ketones, ur: NEGATIVE mg/dL
Nitrite: NEGATIVE
Protein, ur: NEGATIVE mg/dL
Specific Gravity, Urine: 1.025 (ref 1.005–1.030)
pH: 7 (ref 5.0–8.0)

## 2022-03-28 LAB — DIFFERENTIAL
Abs Immature Granulocytes: 0.04 10*3/uL (ref 0.00–0.07)
Basophils Absolute: 0.1 10*3/uL (ref 0.0–0.1)
Basophils Relative: 1 %
Eosinophils Absolute: 0.1 10*3/uL (ref 0.0–0.5)
Eosinophils Relative: 1 %
Immature Granulocytes: 0 %
Lymphocytes Relative: 22 %
Lymphs Abs: 3.1 10*3/uL (ref 0.7–4.0)
Monocytes Absolute: 1.5 10*3/uL — ABNORMAL HIGH (ref 0.1–1.0)
Monocytes Relative: 10 %
Neutro Abs: 9.3 10*3/uL — ABNORMAL HIGH (ref 1.7–7.7)
Neutrophils Relative %: 66 %

## 2022-03-28 LAB — CBC
HCT: 46.4 % — ABNORMAL HIGH (ref 36.0–46.0)
Hemoglobin: 15.2 g/dL — ABNORMAL HIGH (ref 12.0–15.0)
MCH: 30.8 pg (ref 26.0–34.0)
MCHC: 32.8 g/dL (ref 30.0–36.0)
MCV: 93.9 fL (ref 80.0–100.0)
Platelets: 283 10*3/uL (ref 150–400)
RBC: 4.94 MIL/uL (ref 3.87–5.11)
RDW: 15 % (ref 11.5–15.5)
WBC: 14 10*3/uL — ABNORMAL HIGH (ref 4.0–10.5)
nRBC: 0 % (ref 0.0–0.2)

## 2022-03-28 LAB — CBG MONITORING, ED: Glucose-Capillary: 87 mg/dL (ref 70–99)

## 2022-03-28 LAB — COMPREHENSIVE METABOLIC PANEL
ALT: 56 U/L — ABNORMAL HIGH (ref 0–44)
AST: 40 U/L (ref 15–41)
Albumin: 4.1 g/dL (ref 3.5–5.0)
Alkaline Phosphatase: 82 U/L (ref 38–126)
Anion gap: 11 (ref 5–15)
BUN: 17 mg/dL (ref 8–23)
CO2: 24 mmol/L (ref 22–32)
Calcium: 9.2 mg/dL (ref 8.9–10.3)
Chloride: 105 mmol/L (ref 98–111)
Creatinine, Ser: 0.69 mg/dL (ref 0.44–1.00)
GFR, Estimated: >60 mL/min (ref >60)
Glucose, Bld: 103 mg/dL — ABNORMAL HIGH (ref 70–99)
Potassium: 3.7 mmol/L (ref 3.5–5.1)
Sodium: 140 mmol/L (ref 135–145)
Total Bilirubin: 0.7 mg/dL (ref 0.3–1.2)
Total Protein: 6.9 g/dL (ref 6.5–8.1)

## 2022-03-28 LAB — ETHANOL: Alcohol, Ethyl (B): <10 mg/dL (ref <10)

## 2022-03-28 LAB — PROTIME-INR
INR: 1 (ref 0.8–1.2)
Prothrombin Time: 13 seconds (ref 11.4–15.2)

## 2022-03-28 LAB — HEMOGLOBIN A1C
Hgb A1c MFr Bld: 5.7 % — ABNORMAL HIGH (ref 4.8–5.6)
Mean Plasma Glucose: 116.89 mg/dL

## 2022-03-28 LAB — APTT: aPTT: 28 seconds (ref 24–36)

## 2022-03-28 MED ORDER — ASPIRIN 81 MG PO CHEW
324.0000 mg | CHEWABLE_TABLET | ORAL | Status: AC
  Administered 2022-03-28: 324 mg via ORAL
  Filled 2022-03-28: qty 4

## 2022-03-28 MED ORDER — STROKE: EARLY STAGES OF RECOVERY BOOK
Freq: Once | Status: DC
  Filled 2022-03-28: qty 1

## 2022-03-28 MED ORDER — SODIUM CHLORIDE 0.9 % IV SOLN: INTRAVENOUS | Status: DC

## 2022-03-28 MED ORDER — SODIUM CHLORIDE 0.9% FLUSH
3.0000 mL | Freq: Once | INTRAVENOUS | Status: DC
  Filled 2022-03-28: qty 3

## 2022-03-28 MED ORDER — IOHEXOL 350 MG/ML SOLN
75.0000 mL | Freq: Once | INTRAVENOUS | Status: AC | PRN
  Administered 2022-03-28: 75 mL via INTRAVENOUS

## 2022-03-28 MED ORDER — ACETAMINOPHEN 325 MG PO TABS: 650.0000 mg | ORAL_TABLET | ORAL | Status: DC | PRN

## 2022-03-28 MED ORDER — CLOPIDOGREL BISULFATE 300 MG PO TABS
300.0000 mg | ORAL_TABLET | ORAL | Status: AC
  Administered 2022-03-28: 300 mg via ORAL
  Filled 2022-03-28: qty 1

## 2022-03-28 MED ORDER — ACETAMINOPHEN 160 MG/5ML PO SOLN: 650.0000 mg | ORAL | Status: DC | PRN

## 2022-03-28 MED ORDER — ACETAMINOPHEN 650 MG RE SUPP: 650.0000 mg | RECTAL | Status: DC | PRN

## 2022-03-28 NOTE — ED Notes (Signed)
Patient transported to CT 

## 2022-03-28 NOTE — ED Notes (Signed)
ED Provider at bedside. 

## 2022-03-28 NOTE — ED Notes (Signed)
Pt son here to transport pt to [facility] ER for a MRI. IV intact. Wrapped IV with Kerlex and dressing is dry and intact.

## 2022-03-28 NOTE — Subjective & Objective (Signed)
Presented with intermittent slurred speech right facial droop about 3 days ago otherwise no headache no other neurological deficits no history of prior CVA Reports history of hypertension Patient at first thought she may have Bell's palsy so she did not seek medical attention.  But then her family members urged her to be seen.  She called her primary care provider who recommended CT/MRI she went to urgent care this morning and waited for 4 hours.  Then she went to another urgent care and found presented to emergency department Otherwise no chest pain or shortness of breath

## 2022-03-28 NOTE — ED Notes (Signed)
Pt sitting in bed with her phone texting

## 2022-03-28 NOTE — ED Notes (Signed)
MRI made aware of pts arrival.

## 2022-03-28 NOTE — H&P (Incomplete)
Faith Massey DGL:[id number] DOB: 1945-11-24 DOA: 03/28/2022     PCP: Patient, No Pcp Per      Patient arrived to ER on 03/28/22 at 1251 Referred by Attending Toy Baker, MD   Patient coming from:    home Lives alone,      Chief Complaint:   Chief Complaint  Patient presents with  . Facial Droop    HPI: Faith Massey is a 77 y.o. female with medical history significant of HTN, breast cancer sp double mastectomy 1990    Presented with right facial droop word finding difficulty Presented with intermittent slurred speech right facial droop about 3 days ago otherwise no headache no other neurological deficits no history of prior CVA Reports history of hypertension Patient at first thought she may have Bell's palsy so she did not seek medical attention.  But then her family members urged her to be seen.  She called her primary care provider who recommended CT/MRI she went to urgent care this morning and waited for 4 hours.  Then she went to another urgent care and found presented to emergency department Otherwise no chest pain or shortness of breath    Drinks 1 drink a week Does not smoke  Reports her voice has been coming and going for the past 2 days Reports short of breath no CP    Regarding pertinent Chronic problems:    HTN not on meds   States has not been taking any meds for many years  While in ER: Clinical Course as of 03/28/22 1945  Mon Mar 28, 2022  1824 Dr Leonel Ramsay consulted. Will admit to medicine for further management.  [RP]  [id number] Dr Roel Cluck from [facility]ist will admit patient. [RP]    Clinical Course User Index [RP] Fransico Meadow, MD     CT HEAD   NON acute MR BRAIN Small acute infarct in the right frontal white matter without hemorrhage or mass effect.  CXR - possible mass CTA -  Negative for acute pulmonary embolus. 2. 3 x 4 x 5.3 cm soft tissue mass within the right anterior mediastinum. Mass could represent thymoma or other  thymic mass, lymphoma, or metastatic focus.    Following Medications were ordered in ER: Medications  aspirin chewable tablet 324 mg (324 mg Oral Given 03/28/22 1910)  clopidogrel (PLAVIX) tablet 300 mg (300 mg Oral Given 03/28/22 1910)  iohexol (OMNIPAQUE) 350 MG/ML injection 75 mL (75 mLs Intravenous Contrast Given 03/28/22 1922)    _______________________________________________________ ER Provider Called:   Neurology Dr. Leonel Ramsay They Recommend admit to medicine       ED Triage Vitals  Enc Vitals Group     BP 03/28/22 1308 (!) 182/105     Pulse Rate 03/28/22 1308 100     Resp 03/28/22 1308 18     Temp 03/28/22 1308 (!) 97.5 F (36.4 C)     Temp Source 03/28/22 1308 Oral     SpO2 03/28/22 1308 100 %     Weight --      Height --      Head Circumference --      Peak Flow --      Pain Score 03/28/22 1308 0     Pain Loc --      Pain Edu? --      Excl. in [place]? --   TMAX(24)@     _________________________________________ Significant initial  Findings: Abnormal Labs Reviewed  CBC - Abnormal; Notable for the following components:  Result Value   WBC 14.0 (*)    Hemoglobin 15.2 (*)    HCT 46.4 (*)    All other components within normal limits  DIFFERENTIAL - Abnormal; Notable for the following components:   Neutro Abs 9.3 (*)    Monocytes Absolute 1.5 (*)    All other components within normal limits  COMPREHENSIVE METABOLIC PANEL - Abnormal; Notable for the following components:   Glucose, Bld 103 (*)    ALT 56 (*)    All other components within normal limits     ECG: Ordered Personally reviewed and interpreted by me showing: HR : 99 Rhythm: Sinus rhythm Borderline ST depression, diffuse leads Minimal ST elevation, anterior leads  QTC 443   The recent clinical data is shown below. Vitals:   03/28/22 1507 03/28/22 1515 03/28/22 1527 03/28/22 1856  BP: (!) 187/109  (!) 184/81 (!) 144/109  Pulse: (!) 111 (!) 107 (!) 109 (!) 105  Resp: 18 (!) '21 18 18  '$ Temp:     98.3 F (36.8 C)  TempSrc:    Oral  SpO2: 100% 100% 100% 100%     WBC     Component Value Date/Time   WBC 14.0 (H) 03/28/2022 1338   LYMPHSABS 3.1 03/28/2022 1338   MONOABS 1.5 (H) 03/28/2022 1338   EOSABS 0.1 03/28/2022 1338   BASOSABS 0.1 03/28/2022 1338       UA   no evidence of UTI      Urine analysis:    Component Value Date/Time   COLORURINE STRAW (A) 03/28/2022 2209   APPEARANCEUR CLEAR 03/28/2022 2209   LABSPEC 1.025 03/28/2022 2209   PHURINE 7.0 03/28/2022 2209   GLUCOSEU NEGATIVE 03/28/2022 2209   HGBUR NEGATIVE 03/28/2022 2209   BILIRUBINUR NEGATIVE 03/28/2022 2209   KETONESUR NEGATIVE 03/28/2022 2209   PROTEINUR NEGATIVE 03/28/2022 2209   NITRITE NEGATIVE 03/28/2022 2209   LEUKOCYTESUR TRACE (A) 03/28/2022 2209     _______________________________________________ Hospitalist was called for admission for cva, and mediastinal mass    The following Work up has been ordered so far:  Orders Placed This Encounter  Procedures  . CT HEAD WO CONTRAST  . MR BRAIN WO CONTRAST  . CT ANGIO HEAD NECK W WO CM  . DG Chest 1 View  . Protime-INR  . APTT  . CBC  . Differential  . Comprehensive metabolic panel  . Ethanol  . TSH  . Urinalysis, Complete w Microscopic  . CK  . Magnesium  . Phosphorus  . Prealbumin  . Diet NPO time specified  . Cardiac monitoring  . Swallow screen (NPO until completed)  . NIH Stroke Scale  . [place] IV, Maintain IV access  . If O2 sat  . Cardiac Monitoring - Continuous Indefinite  . Consult for [facility] Admission  . Consult to neurology  . Pulse oximetry, continuous  . CBG monitoring, ED  . ED EKG  . EKG 12-Lead  . Admit to Inpatient (patient's expected length of stay will be greater than 2 midnights or inpatient only procedure)     OTHER Significant initial  Findings:  labs showing:    Recent Labs  Lab 03/28/22 1338  NA 140  K 3.7  CO2 24  GLUCOSE 103*  BUN 17  CREATININE 0.69  CALCIUM 9.2     Cr  stable,   Lab Results  Component Value Date   CREATININE 0.69 03/28/2022   CREATININE 0.61 05/06/2013   CREATININE 0.97 10/19/2012    Recent Labs  Lab 03/28/22 1338  AST 40  ALT 56*  ALKPHOS 82  BILITOT 0.7  PROT 6.9  ALBUMIN 4.1   Lab Results  Component Value Date   CALCIUM 9.2 03/28/2022          Plt: Lab Results  Component Value Date   PLT 283 03/28/2022        Recent Labs  Lab 03/28/22 1338  WBC 14.0*  NEUTROABS 9.3*  HGB 15.2*  HCT 46.4*  MCV 93.9  PLT 283    HG/HCT stable,       Component Value Date/Time   HGB 15.2 (H) 03/28/2022 1338   HCT 46.4 (H) 03/28/2022 1338   MCV 93.9 03/28/2022 1338    DM  labs:  HbA1C: No results for input(s): "HGBA1C" in the last 8760 hours.     CBG (last 3)  Recent Labs    03/28/22 1350  GLUCAP 87          Cultures: No results found for: "SDES", "SPECREQUEST", "CULT", "REPTSTATUS"   Radiological Exams on Admission: CT Angio Chest Pulmonary Embolism (PE) W or WO Contrast  Result Date: 03/28/2022 CLINICAL DATA:  Stroke, abnormal CT EXAM: CT ANGIOGRAPHY CHEST WITH CONTRAST TECHNIQUE: Multidetector CT imaging of the chest was performed using the standard protocol during bolus administration of intravenous contrast. Multiplanar CT image reconstructions and MIPs were obtained to evaluate the vascular anatomy. RADIATION DOSE REDUCTION: This exam was performed according to the departmental dose-optimization program which includes automated exposure control, adjustment of the mA and/or kV according to patient size and/or use of iterative reconstruction technique. CONTRAST:  2m OMNIPAQUE IOHEXOL 350 MG/ML SOLN COMPARISON:  CT 03/28/2022 FINDINGS: Cardiovascular: Satisfactory opacification of the pulmonary arteries to the segmental level. No evidence of pulmonary embolism. Nonaneurysmal aorta. Mild atherosclerosis. Normal cardiac size. No pericardial effusion. Mediastinum/Nodes: Midline trachea. No thyroid mass.  Esophagus within normal limits. No suspicious lymph nodes. Right anterior mediastinal soft tissue mass, this measures 3 by 4 by 5.3 cm. Lungs/Pleura: Negative for pneumothorax or pleural effusion. Bandlike atelectasis at the lung bases. Upper Abdomen: No acute finding Musculoskeletal: Bilateral breast implants with mild capsular calcification. No acute osseous abnormality. Review of the MIP images confirms the above findings. IMPRESSION: 1. Negative for acute pulmonary embolus. 2. 3 x 4 x 5.3 cm soft tissue mass within the right anterior mediastinum. Mass could represent thymoma or other thymic mass, lymphoma, or metastatic focus. Aortic Atherosclerosis (ICD10-I70.0). Electronically Signed   By: KDonavan FoilM.D.   On: 03/28/2022 22:17   DG Chest 1 View  Result Date: 03/28/2022 CLINICAL DATA:  Stroke EXAM: CHEST  1 VIEW COMPARISON:  Chest radiograph 05/06/2013 FINDINGS: The heart size is normal. There is fullness is in the upper mediastinum and right hilum suspicious for mass lesion given findings on CTA head/neck from earlier the same day. Linear opacities in the lung bases likely reflect platelike atelectasis. There is no other focal airspace disease. There is no pulmonary edema. There is no pleural effusion or pneumothorax There is no acute osseous abnormality. IMPRESSION: Fullness in the upper mediastinum and right hilum suspicious for mass lesion given findings on same day CTA head/neck. Recommend CT chest with contrast for further evaluation. Electronically Signed   By: PValetta MoleM.D.   On: 03/28/2022 20:05   CT ANGIO HEAD NECK W WO CM  Result Date: 03/28/2022 CLINICAL DATA:  Intermittent slurred speech and partial right facial droop, symptoms for 3 days. EXAM: CT ANGIOGRAPHY HEAD AND NECK TECHNIQUE: Multidetector CT imaging  of the head and neck was performed using the standard protocol during bolus administration of intravenous contrast. Multiplanar CT image reconstructions and MIPs were obtained to  evaluate the vascular anatomy. Carotid stenosis measurements (when applicable) are obtained utilizing NASCET criteria, using the distal internal carotid diameter as the denominator. RADIATION DOSE REDUCTION: This exam was performed according to the departmental dose-optimization program which includes automated exposure control, adjustment of the mA and/or kV according to patient size and/or use of iterative reconstruction technique. CONTRAST:  66m OMNIPAQUE IOHEXOL 350 MG/ML SOLN COMPARISON:  Brain MRI from earlier today FINDINGS: CTA NECK FINDINGS Aortic arch: Unremarkable Right carotid system: Vessels are smoothly contoured and widely patent. Partial retropharyngeal course Left carotid system: Vessels are smoothly contoured and widely patent. Partial retropharyngeal course Vertebral arteries: No proximal subclavian or vertebral stenosis or beading. Skeleton: No acute finding.  Generalized cervical spine degeneration Other neck: No acute finding Upper chest: Lobulated soft tissue density in the anterior mediastinum, partially covered and measuring 4.3 cm, appearance not typical for pericardial recess. Review of the MIP images confirms the above findings CTA HEAD FINDINGS Anterior circulation: Vessels are smoothly contoured and widely patent. No notable atheromatous change for age, only mild calcification at the carotid siphons. No branch occlusion, beading, or aneurysm. Posterior circulation:  Fetal type PCA flow with Venous sinuses: As permitted by contrast timing, patent. Anatomic variants: As above Review of the MIP images confirms the above findings IMPRESSION: 1. 4.3 cm partially covered mass or collection in the anterior mediastinum, recommend chest CT with contrast. 2. No emergent vascular finding. Mild for age atherosclerosis without stenosis or plaque irregularity. Electronically Signed   By: JJorje GuildM.D.   On: 03/28/2022 19:50   MR BRAIN WO CONTRAST  Result Date: 03/28/2022 CLINICAL DATA:   Stroke suspected. EXAM: MRI HEAD WITHOUT CONTRAST TECHNIQUE: Multiplanar, multiecho pulse sequences of the brain and surrounding structures were obtained without intravenous contrast. COMPARISON:  Same-day CT head FINDINGS: Brain: There is a small focus of linear diffusion restriction in the right frontal subcortical white matter consistent with acute infarct. There is no associated hemorrhage or mass effect. There is no other evidence of acute infarct. Parenchymal volume is normal for age. The ventricles are normal in size. Gray-white differentiation is preserved. Patchy and confluence FLAIR signal abnormality in the supratentorial white matter is nonspecific but likely reflects sequela of moderate chronic small-vessel ischemic change. 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 The pituitary and suprasellar region are normal. There is no mass lesion. There is no mass effect or midline shift. Vascular: Normal flow voids. Skull and upper cervical spine: Normal marrow signal. Sinuses/Orbits: The paranasal sinuses are clear. A left lens implant is in place. The globes and orbits are otherwise unremarkable. Other: None. 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 Electronically Signed   By: PValetta MoleM.D.   On: 03/28/2022 17:38   CT HEAD WO CONTRAST  Result Date: 03/28/2022 CLINICAL DATA:  Neurologic deficit. EXAM: CT HEAD WITHOUT CONTRAST TECHNIQUE: Contiguous axial images were obtained from the base of the skull through the vertex without intravenous contrast. RADIATION DOSE REDUCTION: This exam was performed according to the departmental dose-optimization program which includes automated exposure control, adjustment of the mA and/or kV according to patient size and/or use of iterative reconstruction technique. COMPARISON:  None Available. FINDINGS: Brain: There is age-related atrophy and chronic microvascular ischemic changes. There is  no acute intracranial hemorrhage.  No mass effect or midline shift. No extra-axial fluid collection. Vascular: No hyperdense vessel or unexpected calcification. Skull: Normal. Negative for fracture or focal lesion. Sinuses/Orbits: No acute finding. Other: None IMPRESSION: 1. No acute intracranial pathology. 2. Age-related atrophy and chronic microvascular ischemic changes. Electronically Signed   By: Anner Crete M.D.   On: 03/28/2022 14:05   _______________________________________________________________________________________________________ Latest  Blood pressure (!) 144/109, pulse (!) 105, temperature 98.3 F (36.8 C), temperature source Oral, resp. rate 18, SpO2 100 %.   Vitals  labs and radiology finding personally reviewed  Review of Systems:    Pertinent positives include:  shortness of breath at rest  Constitutional:  No weight loss, night sweats, Fevers, chills, fatigue, weight loss  HEENT:  No headaches, Difficulty swallowing,Tooth/dental problems,Sore throat,  No sneezing, itching, ear ache, nasal congestion, post nasal drip,  Cardio-vascular:  No chest pain, Orthopnea, PND, anasarca, dizziness, palpitations.no Bilateral lower extremity swelling  GI:  No heartburn, indigestion, abdominal pain, nausea, vomiting, diarrhea, change in bowel habits, loss of appetite, melena, blood in stool, hematemesis Resp:  no . No dyspnea on exertion, No excess mucus, no productive cough, No non-productive cough, No coughing up of blood.No change in color of mucus.No wheezing. Skin:  no rash or lesions. No jaundice GU:  no dysuria, change in color of urine, no urgency or frequency. No straining to urinate.  No flank pain.  Musculoskeletal:  No joint pain or no joint swelling. No decreased range of motion. No back pain.  Psych:  No change in mood or affect. No depression or anxiety. No memory loss.  Neuro: no localizing neurological complaints, no tingling, no weakness, no double vision, no  gait abnormality, no slurred speech, no confusion  All systems reviewed and apart from [place] all are negative _______________________________________________________________________________________________ Past Medical History:   Past Medical History:  Diagnosis Date  . Osteoporosis 10/12/2012     Past Surgical History:  Procedure Laterality Date  . ABDOMINAL HYSTERECTOMY  2005  . BACK SURGERY  2000  . MASTECTOMY Bilateral 1995    Social History:  Ambulatory   independently       reports that she has never smoked. She has never used smokeless tobacco. She reports current alcohol use. She reports that she does not use drugs.   Family History:   Family History  Problem Relation Age of Onset  . Stroke Mother   . Hypertension Mother   . Hypertension Father    ______________________________________________________________________________________________ Allergies: No Known Allergies   Prior to Admission medications   Medication Sig Start Date End Date Taking? Authorizing Provider  azithromycin (ZITHROMAX) 250 MG tablet Take 2 tablets now and then one tablet for 4 days. 05/08/13   Breeback, Jade L, PA-C  benzonatate (TESSALON) 200 MG capsule Take 1 capsule (200 mg total) by mouth 2 (two) times daily as needed for cough. 05/08/13   Breeback, Royetta Car, PA-C  Cholecalciferol (VITAMIN D-3) 5000 UNITS TABS Take 5,000 Units by mouth daily.    [provider]  HYDROcodone-homatropine (HYCODAN) 5-1.5 MG/5ML syrup Take 5 mLs by mouth at bedtime as needed for cough. 05/06/13   Donella Stade, PA-C  UNABLE TO FIND Med Name: Zyflanmend    [provider]    ___________________________________________________________________________________________________ Physical Exam:    03/28/2022    6:56 PM 03/28/2022    3:27 PM 03/28/2022    3:15 PM  Vitals with BMI  Systolic 790 240   Diastolic 973 81   Pulse 532 109 107  1. General:  in No  Acute distress    Chronically ill    -appearing 2. Psychological: Alert and   Oriented 3. Head/ENT:    Dry Mucous Membranes                          Head Non traumatic, neck supple                           Poor Dentition 4. SKIN:  decreased Skin turgor,  Skin clean Dry and intact no rash 5. Heart: Regular rate and rhythm no  Murmur, no Rub or gallop 6. Lungs:  , no wheezes or crackles   7. Abdomen: Soft,  non-tender, Non distended  bowel sounds present 8. Lower extremities: no clubbing, cyanosis, no  edema 9. Neurologically strength 5 out of 5 in all 4 extremities cranial nerves II through XII intact right eye ptosis and facilal droop noted 10. MSK: Normal range of motion    Chart has been reviewed  ______________________________________________________________________________________________  Assessment/Plan 77 y.o. female with medical history significant of HTN  Admitted for CVA  Present on Admission: . CVA (cerebral vascular accident) ([place]) . HTN (hypertension) . 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        Order PT/OT evaluation.        keep nothing by mouth until passes swallow eval         Will make sure patient is on antiplatelet ASA 81  ,  Plavix agent and statin if LDL > 70       Allow permissive Hypertension keep BP <220/120        Neurology consulted Have seen pt in ER    HTN (hypertension) Allow permissive HTn     Other plan as per orders.  DVT prophylaxis:  SCD     Code Status:    Code Status: Not on file FULL CODE  as per patient   I had personally discussed CODE STATUS with patient     Family Communication:   Family not at  Bedside    Disposition Plan:     To home once workup is complete and patient is stable   Following  barriers for discharge:                               Will need consultants to evaluate patient prior to discharge                      Would benefit from PT/OT eval prior to DC  Ordered                                       Consults called: NEurology is aware  Admission status:  ED Disposition     ED Disposition  Admit   Condition  --  Comment  Hospital Area: [facility] [[id number]]  Level of Care: Telemetry Medical [[id number]]  May admit patient to [facility] or [facility] if equivalent level of care is available:: No  Covid Evaluation: Asymptomatic - no recent exposure (last 10 days) testing not required  Diagnosis: CVA (cerebral vascular accident) [facility]) [[id number]]  Admitting Physician: Toy Baker [[id number]]  Attending Physician: Toy Baker [[id number]]  Certification:: I certify this patient will need inpatient services for at least 2 midnights  Estimated Length of Stay: 2             inpatient     I Expect 2 midnight stay secondary to severity of patient's current illness need for inpatient interventions justified by the following:    Severe lab/radiological/exam abnormalities including:    New CVA That are currently affecting medical management.   I expect  patient to be hospitalized for 2 midnights requiring inpatient medical care.  Patient is at high risk for adverse outcome (such as loss of life or disability) if not treated.  Indication for inpatient stay as follows:      Need for IV STROKE work up    Level of care     tele  indefinitely please discontinue once patient no longer qualifies COVID-19 Labs     Aadon Gorelik 03/28/2022, 9:07 PM ***  [facility]     after 2 AM please page floor coverage PA If 7AM-7PM, please contact the day team taking care of the patient using [email or web address]   Patient was evaluated in the context of the global COVID-19 pandemic, which necessitated consideration that the patient might be at  risk for infection with the SARS-CoV-2 virus that causes COVID-19. Institutional protocols and algorithms that pertain to the evaluation of patients at risk for COVID-19 are in a state of rapid change based on information released by regulatory bodies including the CDC and federal and state organizations. These policies and algorithms were followed during the patient's care.

## 2022-03-28 NOTE — H&P (Signed)
Faith Massey WUJ:[id number] DOB: 21-Nov-1945 DOA: 03/28/2022     PCP: Patient, No Pcp Per      Patient arrived to ER on 03/28/22 at 1251 Referred by Attending Toy Baker, MD   Patient coming from:    home Lives alone,      Chief Complaint:   Chief Complaint  Patient presents with   Facial Droop    HPI: Faith Massey is a 77 y.o. female with medical history significant of HTN, breast cancer sp double mastectomy 1990    Presented with right facial droop word finding difficulty Presented with intermittent slurred speech right facial droop about 3 days ago otherwise no headache no other neurological deficits no history of prior CVA Reports history of hypertension Patient at first thought she may have Bell's palsy so she did not seek medical attention.  But then her family members urged her to be seen.  She called her primary care provider who recommended CT/MRI she went to urgent care this morning and waited for 4 hours.  Then she went to another urgent care and found presented to emergency department Otherwise no chest pain or shortness of breath    Drinks 1 drink a week Does not smoke  Reports her voice has been coming and going for the past 2 days Reports short of breath no CP    Regarding pertinent Chronic problems:    HTN not on meds   States has not been taking any meds for many years  While in ER: Clinical Course as of 03/28/22 1945  Mon Mar 28, 2022  1824 Dr Leonel Ramsay consulted. Will admit to medicine for further management.  [RP]  [id number] Dr Roel Cluck from [facility] will admit patient. [RP]    Clinical Course User Index [RP] Fransico Meadow, MD      Ordered  CT HEAD   NON acute MR BRAIN Small acute infarct in the right frontal white matter without hemorrhage or mass effect.  CXR - possible mass   Following Medications were ordered in ER: Medications  aspirin chewable tablet 324 mg (324 mg Oral Given 03/28/22 1910)  clopidogrel (PLAVIX)  tablet 300 mg (300 mg Oral Given 03/28/22 1910)  iohexol (OMNIPAQUE) 350 MG/ML injection 75 mL (75 mLs Intravenous Contrast Given 03/28/22 1922)    _______________________________________________________ ER Provider Called:   Neurology Dr. Leonel Ramsay They Recommend admit to medicine       ED Triage Vitals  Enc Vitals Group     BP 03/28/22 1308 (!) 182/105     Pulse Rate 03/28/22 1308 100     Resp 03/28/22 1308 18     Temp 03/28/22 1308 (!) 97.5 F (36.4 C)     Temp Source 03/28/22 1308 Oral     SpO2 03/28/22 1308 100 %     Weight --      Height --      Head Circumference --      Peak Flow --      Pain Score 03/28/22 1308 0     Pain Loc --      Pain Edu? --      Excl. in [place]? --   TMAX(24)@     _________________________________________ Significant initial  Findings: Abnormal Labs Reviewed  CBC - Abnormal; Notable for the following components:      Result Value   WBC 14.0 (*)    Hemoglobin 15.2 (*)    HCT 46.4 (*)    All other components within normal limits  DIFFERENTIAL -  Abnormal; Notable for the following components:   Neutro Abs 9.3 (*)    Monocytes Absolute 1.5 (*)    All other components within normal limits  COMPREHENSIVE METABOLIC PANEL - Abnormal; Notable for the following components:   Glucose, Bld 103 (*)    ALT 56 (*)    All other components within normal limits     ECG: Ordered Personally reviewed and interpreted by me showing: HR : 99 Rhythm: Sinus rhythm Borderline ST depression, diffuse leads Minimal ST elevation, anterior leads  QTC 443   The recent clinical data is shown below. Vitals:   03/28/22 1507 03/28/22 1515 03/28/22 1527 03/28/22 1856  BP: (!) 187/109  (!) 184/81 (!) 144/109  Pulse: (!) 111 (!) 107 (!) 109 (!) 105  Resp: 18 (!) '21 18 18  '$ Temp:    98.3 F (36.8 C)  TempSrc:    Oral  SpO2: 100% 100% 100% 100%     WBC     Component Value Date/Time   WBC 14.0 (H) 03/28/2022 1338   LYMPHSABS 3.1 03/28/2022 1338   MONOABS 1.5 (H)  03/28/2022 1338   EOSABS 0.1 03/28/2022 1338   BASOSABS 0.1 03/28/2022 1338       UA *** no evidence of UTI  ***Pending ***not ordered   Urine analysis: No results found for: "COLORURINE", "APPEARANCEUR", "LABSPEC", "PHURINE", "GLUCOSEU", "HGBUR", "BILIRUBINUR", "KETONESUR", "PROTEINUR", "UROBILINOGEN", "NITRITE", "LEUKOCYTESUR"   No results found for this or any previous visit.   _______________________________________________ Hospitalist was called for admission for cva    The following Work up has been ordered so far:  Orders Placed This Encounter  Procedures   CT HEAD WO CONTRAST   MR BRAIN WO CONTRAST   CT ANGIO HEAD NECK W WO CM   DG Chest 1 View   Protime-INR   APTT   CBC   Differential   Comprehensive metabolic panel   Ethanol   TSH   Urinalysis, Complete w Microscopic   CK   Magnesium   Phosphorus   Prealbumin   Diet NPO time specified   Cardiac monitoring   Swallow screen (NPO until completed)   NIH Stroke Scale   Saline Lock IV, Maintain IV access   If O2 sat   Cardiac Monitoring - Continuous Indefinite   Consult for Unassigned Medical Admission   Consult to neurology   Pulse oximetry, continuous   CBG monitoring, ED   ED EKG   EKG 12-Lead   Admit to Inpatient (patient's expected length of stay will be greater than 2 midnights or inpatient only procedure)     OTHER Significant initial  Findings:  labs showing:    Recent Labs  Lab 03/28/22 1338  NA 140  K 3.7  CO2 24  GLUCOSE 103*  BUN 17  CREATININE 0.69  CALCIUM 9.2    Cr  * stable,  Up from baseline see below Lab Results  Component Value Date   CREATININE 0.69 03/28/2022   CREATININE 0.61 05/06/2013   CREATININE 0.97 10/19/2012    Recent Labs  Lab 03/28/22 1338  AST 40  ALT 56*  ALKPHOS 82  BILITOT 0.7  PROT 6.9  ALBUMIN 4.1   Lab Results  Component Value Date   CALCIUM 9.2 03/28/2022          Plt: Lab Results  Component Value Date   PLT 283 03/28/2022        COVID-19 Labs  No results for input(s): "DDIMER", "FERRITIN", "LDH", "CRP" in the last 72 hours.  No results found for: "SARSCOV2NAA"   Arterial ***Venous  Blood Gas result:  pH *** pCO2 ***; pO2 ***;     %O2 Sat ***.  ABG No results found for: "PHART", "PCO2ART", "PO2ART", "HCO3", "TCO2", "ACIDBASEDEF", "O2SAT"       Recent Labs  Lab 03/28/22 1338  WBC 14.0*  NEUTROABS 9.3*  HGB 15.2*  HCT 46.4*  MCV 93.9  PLT 283    HG/HCT * stable,  Down *Up from baseline see below    Component Value Date/Time   HGB 15.2 (H) 03/28/2022 1338   HCT 46.4 (H) 03/28/2022 1338   MCV 93.9 03/28/2022 1338      No results for input(s): "LIPASE", "AMYLASE" in the last 168 hours. No results for input(s): "AMMONIA" in the last 168 hours.    Cardiac Panel (last 3 results) No results for input(s): "CKTOTAL", "CKMB", "TROPONINI", "RELINDX" in the last 72 hours.  .car BNP (last 3 results) No results for input(s): "BNP" in the last 8760 hours.    DM  labs:  HbA1C: No results for input(s): "HGBA1C" in the last 8760 hours.     CBG (last 3)  Recent Labs    03/28/22 1350  GLUCAP 87          Cultures: No results found for: "SDES", "SPECREQUEST", "CULT", "REPTSTATUS"   Radiological Exams on Admission: DG Chest 1 View  Result Date: 03/28/2022 CLINICAL DATA:  Stroke EXAM: CHEST  1 VIEW COMPARISON:  Chest radiograph 05/06/2013 FINDINGS: The heart size is normal. There is fullness is in the upper mediastinum and right hilum suspicious for mass lesion given findings on CTA head/neck from earlier the same day. Linear opacities in the lung bases likely reflect platelike atelectasis. There is no other focal airspace disease. There is no pulmonary edema. There is no pleural effusion or pneumothorax There is no acute osseous abnormality. IMPRESSION: Fullness in the upper mediastinum and right hilum suspicious for mass lesion given findings on same day CTA head/neck. Recommend CT chest  with contrast for further evaluation. Electronically Signed   By: Valetta Mole M.D.   On: 03/28/2022 20:05   CT ANGIO HEAD NECK W WO CM  Result Date: 03/28/2022 CLINICAL DATA:  Intermittent slurred speech and partial right facial droop, symptoms for 3 days. EXAM: CT ANGIOGRAPHY HEAD AND NECK TECHNIQUE: Multidetector CT imaging of the head and neck was performed using the standard protocol during bolus administration of intravenous contrast. Multiplanar CT image reconstructions and MIPs were obtained to evaluate the vascular anatomy. Carotid stenosis measurements (when applicable) are obtained utilizing NASCET criteria, using the distal internal carotid diameter as the denominator. RADIATION DOSE REDUCTION: This exam was performed according to the departmental dose-optimization program which includes automated exposure control, adjustment of the mA and/or kV according to patient size and/or use of iterative reconstruction technique. CONTRAST:  21m OMNIPAQUE IOHEXOL 350 MG/ML SOLN COMPARISON:  Brain MRI from earlier today FINDINGS: CTA NECK FINDINGS Aortic arch: Unremarkable Right carotid system: Vessels are smoothly contoured and widely patent. Partial retropharyngeal course Left carotid system: Vessels are smoothly contoured and widely patent. Partial retropharyngeal course Vertebral arteries: No proximal subclavian or vertebral stenosis or beading. Skeleton: No acute finding.  Generalized cervical spine degeneration Other neck: No acute finding Upper chest: Lobulated soft tissue density in the anterior mediastinum, partially covered and measuring 4.3 cm, appearance not typical for pericardial recess. Review of the MIP images confirms the above findings CTA HEAD FINDINGS Anterior circulation: Vessels are smoothly contoured and widely patent. No  notable atheromatous change for age, only mild calcification at the carotid siphons. No branch occlusion, beading, or aneurysm. Posterior circulation:  Fetal type PCA  flow with Venous sinuses: As permitted by contrast timing, patent. Anatomic variants: As above Review of the MIP images confirms the above findings IMPRESSION: 1. 4.3 cm partially covered mass or collection in the anterior mediastinum, recommend chest CT with contrast. 2. No emergent vascular finding. Mild for age atherosclerosis without stenosis or plaque irregularity. Electronically Signed   By: Jorje Guild M.D.   On: 03/28/2022 19:50   MR BRAIN WO CONTRAST  Result Date: 03/28/2022 CLINICAL DATA:  Stroke suspected. EXAM: MRI HEAD WITHOUT CONTRAST TECHNIQUE: Multiplanar, multiecho pulse sequences of the brain and surrounding structures were obtained without intravenous contrast. COMPARISON:  Same-day CT head FINDINGS: Brain: There is a small focus of linear diffusion restriction in the right frontal subcortical white matter consistent with acute infarct. There is no associated hemorrhage or mass effect. There is no other evidence of acute infarct. Parenchymal volume is normal for age. The ventricles are normal in size. Gray-white differentiation is preserved. Patchy and confluence FLAIR signal abnormality in the supratentorial white matter is nonspecific but likely reflects sequela of moderate chronic small-vessel ischemic change. 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 The pituitary and suprasellar region are normal. There is no mass lesion. There is no mass effect or midline shift. Vascular: Normal flow voids. Skull and upper cervical spine: Normal marrow signal. Sinuses/Orbits: The paranasal sinuses are clear. A left lens implant is in place. The globes and orbits are otherwise unremarkable. Other: None. 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 Electronically Signed   By: Valetta Mole M.D.   On: 03/28/2022 17:38   CT HEAD WO CONTRAST  Result Date: 03/28/2022 CLINICAL DATA:  Neurologic deficit. EXAM: CT HEAD WITHOUT  CONTRAST TECHNIQUE: Contiguous axial images were obtained from the base of the skull through the vertex without intravenous contrast. RADIATION DOSE REDUCTION: This exam was performed according to the departmental dose-optimization program which includes automated exposure control, adjustment of the mA and/or kV according to patient size and/or use of iterative reconstruction technique. COMPARISON:  None Available. FINDINGS: Brain: There is age-related atrophy and chronic microvascular ischemic changes. There is no acute intracranial hemorrhage. No mass effect or midline shift. No extra-axial fluid collection. Vascular: No hyperdense vessel or unexpected calcification. Skull: Normal. Negative for fracture or focal lesion. Sinuses/Orbits: No acute finding. Other: None IMPRESSION: 1. No acute intracranial pathology. 2. Age-related atrophy and chronic microvascular ischemic changes. Electronically Signed   By: Anner Crete M.D.   On: 03/28/2022 14:05   _______________________________________________________________________________________________________ Latest  Blood pressure (!) 144/109, pulse (!) 105, temperature 98.3 F (36.8 C), temperature source Oral, resp. rate 18, SpO2 100 %.   Vitals  labs and radiology finding personally reviewed  Review of Systems:    Pertinent positives include:  shortness of breath at rest  Constitutional:  No weight loss, night sweats, Fevers, chills, fatigue, weight loss  HEENT:  No headaches, Difficulty swallowing,Tooth/dental problems,Sore throat,  No sneezing, itching, ear ache, nasal congestion, post nasal drip,  Cardio-vascular:  No chest pain, Orthopnea, PND, anasarca, dizziness, palpitations.no Bilateral lower extremity swelling  GI:  No heartburn, indigestion, abdominal pain, nausea, vomiting, diarrhea, change in bowel habits, loss of appetite, melena, blood in stool, hematemesis Resp:  no . No dyspnea on exertion, No excess mucus, no productive cough, No  non-productive cough,  No coughing up of blood.No change in color of mucus.No wheezing. Skin:  no rash or lesions. No jaundice GU:  no dysuria, change in color of urine, no urgency or frequency. No straining to urinate.  No flank pain.  Musculoskeletal:  No joint pain or no joint swelling. No decreased range of motion. No back pain.  Psych:  No change in mood or affect. No depression or anxiety. No memory loss.  Neuro: no localizing neurological complaints, no tingling, no weakness, no double vision, no gait abnormality, no slurred speech, no confusion  All systems reviewed and apart from [place] all are negative _______________________________________________________________________________________________ Past Medical History:   Past Medical History:  Diagnosis Date   Osteoporosis 10/12/2012     Past Surgical History:  Procedure Laterality Date   ABDOMINAL HYSTERECTOMY  2005   BACK SURGERY  2000   MASTECTOMY Bilateral 1995    Social History:  Ambulatory   independently       reports that she has never smoked. She has never used smokeless tobacco. She reports current alcohol use. She reports that she does not use drugs.     Family History:   Family History  Problem Relation Age of Onset   Stroke Mother    Hypertension Mother    Hypertension Father    ______________________________________________________________________________________________ Allergies: No Known Allergies   Prior to Admission medications   Medication Sig Start Date End Date Taking? Authorizing Provider  azithromycin (ZITHROMAX) 250 MG tablet Take 2 tablets now and then one tablet for 4 days. 05/08/13   Breeback, Jade L, PA-C  benzonatate (TESSALON) 200 MG capsule Take 1 capsule (200 mg total) by mouth 2 (two) times daily as needed for cough. 05/08/13   Breeback, Royetta Car, PA-C  Cholecalciferol (VITAMIN D-3) 5000 UNITS TABS Take 5,000 Units by mouth daily.    [provider]   HYDROcodone-homatropine (HYCODAN) 5-1.5 MG/5ML syrup Take 5 mLs by mouth at bedtime as needed for cough. 05/06/13   Donella Stade, PA-C  UNABLE TO FIND Med Name: Zyflanmend    [provider]    ___________________________________________________________________________________________________ Physical Exam:    03/28/2022    6:56 PM 03/28/2022    3:27 PM 03/28/2022    3:15 PM  Vitals with BMI  Systolic 811 914   Diastolic 782 81   Pulse 956 109 107     1. General:  in No  Acute distress    Chronically ill   -appearing 2. Psychological: Alert and   Oriented 3. Head/ENT:    Dry Mucous Membranes                          Head Non traumatic, neck supple                           Poor Dentition 4. SKIN:  decreased Skin turgor,  Skin clean Dry and intact no rash 5. Heart: Regular rate and rhythm no*** Murmur, no Rub or Massey 6. Lungs: ***Clear to auscultation bilaterally, no wheezes or crackles   7. Abdomen: Soft, ***non-tender, Non distended *** obese ***bowel sounds present 8. Lower extremities: no clubbing, cyanosis, no ***edema 9. Neurologically Grossly intact, moving all 4 extremities equally *** strength 5 out of 5 in all 4 extremities cranial nerves II through XII intact 10. MSK: Normal range of motion    Chart has been reviewed  ______________________________________________________________________________________________  Assessment/Plan 77 y.o. 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        Order PT/OT evaluation.        keep nothing by mouth until passes swallow  eval         Will make sure patient is on antiplatelet ASA 81  ,  Plavix agent and statin if LDL > 70       Allow permissive Hypertension keep BP <220/120        Neurology consulted Have seen pt in ER    HTN (hypertension) Allow permissive HTn     Other plan as per orders.  DVT prophylaxis:  SCD     Code Status:    Code Status: Not on file FULL CODE  as per patient   I had personally discussed CODE STATUS with patient     Family Communication:   Family not at  Bedside    Disposition Plan:     To home once workup is complete and patient is stable   Following barriers for discharge:                               Will need consultants to evaluate patient prior to discharge                      Would benefit from PT/OT eval prior to DC  Ordered                                       Consults called: NEurology is aware  Admission status:  ED Disposition     ED Disposition  [place]: [facility] [[id number]]  Level of Care: Telemetry Medical [[id number]]  May admit patient to [facility] or [facility] if equivalent level of care is available:: No  Covid Evaluation: Asymptomatic - no recent exposure (last 10 days) testing not required  Diagnosis: CVA (cerebral vascular accident) [facility]) [[id number]]  Admitting Physician: Toy Baker [[id number]]  Attending Physician: Toy Baker [[id number]]  Certification:: I certify this patient will need inpatient services for at least 2 midnights  Estimated Length of Stay: 2             inpatient     I Expect 2 midnight stay secondary to severity of patient's current illness need for inpatient interventions justified by the following:    Severe lab/radiological/exam abnormalities including:    New CVA That are currently affecting medical management.   I expect  patient to be hospitalized for 2 midnights requiring inpatient medical care.  Patient is at high risk for adverse outcome  (such as loss of life or disability) if not treated.  Indication for inpatient stay as follows:      Need for IV STROKE work  up    Level of care     tele  indefinitely please discontinue once patient no longer qualifies COVID-19 Labs     Sherrine Salberg 03/28/2022, 9:07 PM ***  [facility]     after 2 AM please page floor coverage PA If 7AM-7PM, please contact the day team taking care of the patient using [email or web address]   Patient was evaluated in the context of the global COVID-19 pandemic, which necessitated consideration that the patient might be at risk for infection with the SARS-CoV-2 virus that causes COVID-19. Institutional protocols and algorithms that pertain to the evaluation of patients at risk for COVID-19 are in a state of rapid change based on information released by regulatory bodies including the CDC and federal and state organizations. These policies and algorithms were followed during the patient's care.

## 2022-03-28 NOTE — Plan of Care (Signed)
On-call phone note  Received a call from ED provided [place] regarding this patient.  Patient with 3-day history of subtle lower facial and eyelid weakness.  No other focal deficits noted but EDP.  I have advised MRI without contrast and if positive, inpatient admission and formal neurological consultation.  Please call the inpatient neurological team at [facility] if the MRI is positive for a formal consultation.   -- Amie [place], MD Neurologist [facility] Pager: [phone]

## 2022-03-28 NOTE — ED Triage Notes (Signed)
Intermittent slurred speech , partial right facial droop, all started 3 days ago  Alert and oriented x 4. Ambulatory to triage room with steady gait . No headache or other neuro deficit . No Hx HTN or Bell's Palsy

## 2022-03-28 NOTE — Assessment & Plan Note (Signed)
Allow permissive HTn  °

## 2022-03-28 NOTE — ED Provider Notes (Signed)
[facility] Provider Note   CSN: [id number] Arrival date & time: 03/28/22  1251     History  Chief Complaint  Patient presents with   Facial Droop    Faith Massey is a 77 y.o. female with history of osteoporosis and hypertension who presents the emergency department complaining of right-sided facial droop for the past 3 days.  Patient was initially told by a friend that it looked like her right eyelid and right side of her mouth were drooping.  Intermittent speech difficulty, where she has difficulty enunciating her words.  She had looked up her symptoms and thought that this could be Bell's palsy, so she did not think much of it.  Family members urged her to be seen.  She called her doctor this morning and they wanted to have a CT and possibly an MRI. She initially went to urgent care this morning, was good to be a 4-hour wait.  She tried to go to another urgent care, who urged her to go to the emergency room. Family history of TIA's. No recent illness.   HPI     Home Medications Prior to Admission medications   Medication Sig Start Date End Date Taking? Authorizing Provider  azithromycin (ZITHROMAX) 250 MG tablet Take 2 tablets now and then one tablet for 4 days. 05/08/13   Breeback, Jade L, PA-C  benzonatate (TESSALON) 200 MG capsule Take 1 capsule (200 mg total) by mouth 2 (two) times daily as needed for cough. 05/08/13   Breeback, Royetta Car, PA-C  Cholecalciferol (VITAMIN D-3) 5000 UNITS TABS Take 5,000 Units by mouth daily.    [provider]  HYDROcodone-homatropine (HYCODAN) 5-1.5 MG/5ML syrup Take 5 mLs by mouth at bedtime as needed for cough. 05/06/13   Breeback, Royetta Car, PA-C  UNABLE TO FIND Med Name: Zyflanmend    [provider]      Allergies    Patient has no known allergies.    Review of Systems   Review of Systems  Neurological:  Positive for facial asymmetry and speech difficulty.  All other systems reviewed and are  negative.   Physical Exam Updated Vital Signs BP (!) 188/82   Pulse 96   Temp (!) 97.5 F (36.4 C) (Oral)   Resp 19   SpO2 99%  Physical Exam Vitals and nursing note reviewed.  Constitutional:      Appearance: Normal appearance.  HENT:     Head: Normocephalic and atraumatic.  Eyes:     Conjunctiva/sclera: Conjunctivae normal.  Cardiovascular:     Rate and Rhythm: Normal rate and regular rhythm.  Pulmonary:     Effort: Pulmonary effort is normal. No respiratory distress.     Breath sounds: Normal breath sounds.  Abdominal:     General: There is no distension.     Palpations: Abdomen is soft.     Tenderness: There is no abdominal tenderness.  Skin:    General: Skin is warm and dry.  Neurological:     General: No focal deficit present.     Mental Status: She is alert.     Comments: Right upper eyelid droop, right lower lip droop. More noticeable while speaking. Forehead sparing. Intermittent speech difficulty.  5/5 strength in all extremities. Normal sensation. PERRLA, EOMI.       ED Results / Procedures / Treatments   Labs (all labs ordered are listed, but only abnormal results are displayed) Labs Reviewed  CBC - Abnormal; Notable for the following components:  Result Value   WBC 14.0 (*)    Hemoglobin 15.2 (*)    HCT 46.4 (*)    All other components within normal limits  DIFFERENTIAL - Abnormal; Notable for the following components:   Neutro Abs 9.3 (*)    Monocytes Absolute 1.5 (*)    All other components within normal limits  COMPREHENSIVE METABOLIC PANEL - Abnormal; Notable for the following components:   Glucose, Bld 103 (*)    ALT 56 (*)    All other components within normal limits  PROTIME-INR  APTT  ETHANOL  CBG MONITORING, ED    EKG EKG Interpretation  Date/Time:  Monday March 28 2022 13:21:57 EST Ventricular Rate:  99 PR Interval:  119 QRS Duration: 99 QT Interval:  345 QTC Calculation: 443 R Axis:   34 Text Interpretation: Sinus  rhythm Borderline ST depression, diffuse leads Minimal ST elevation, anterior leads No prior EKG for comparison Confirmed by Regan Lemming ([id number]) on 03/28/2022 2:37:49 PM  Radiology CT HEAD WO CONTRAST  Result Date: 03/28/2022 CLINICAL DATA:  Neurologic deficit. EXAM: CT HEAD WITHOUT CONTRAST TECHNIQUE: Contiguous axial images were obtained from the base of the skull through the vertex without intravenous contrast. RADIATION DOSE REDUCTION: This exam was performed according to the departmental dose-optimization program which includes automated exposure control, adjustment of the mA and/or kV according to patient size and/or use of iterative reconstruction technique. COMPARISON:  None Available. FINDINGS: Brain: There is age-related atrophy and chronic microvascular ischemic changes. There is no acute intracranial hemorrhage. No mass effect or midline shift. No extra-axial fluid collection. Vascular: No hyperdense vessel or unexpected calcification. Skull: Normal. Negative for fracture or focal lesion. Sinuses/Orbits: No acute finding. Other: None IMPRESSION: 1. No acute intracranial pathology. 2. Age-related atrophy and chronic microvascular ischemic changes. Electronically Signed   By: Anner Crete M.D.   On: 03/28/2022 14:05    Procedures Procedures    Medications Ordered in ED Medications - No data to display  ED Course/ Medical Decision Making/ A&P                           Medical Decision Making Amount and/or Complexity of Data Reviewed Labs: ordered. Radiology: ordered.  This patient is a 77 y.o. female  who presents to the ED for concern of facial droop x 3 days.   Differential diagnoses prior to evaluation: The emergent differential diagnosis includes, but is not limited to, CVA, bell's palsy. This is not an exhaustive differential.   Past Medical History / Co-morbidities: osteoporosis and hypertension  Physical Exam: Physical exam performed. The pertinent findings include:  Hypertensive, otherwise normal vital signs.  Right upper eyelid and right lower lip droop, intermittent speech difficulty.  Forehead sparing.  Normal strength and sensation in all extremities.  Lab Tests/Imaging studies: I personally interpreted labs/imaging and the pertinent results include: Cytosis of 14, stable hemoglobin.  CMP grossly unremarkable.  Negative ethanol.  CT head without acute abnormalities, some chronic microvascular ischemic changes. I agree with the radiologist interpretation.  MRI brain pending at time of patient transfer.  Cardiac monitoring: EKG obtained and interpreted by my attending physician which shows: Sinus rhythm with nonspecific ST changes   Consultations obtained: I consulted with neurologist on-call Dr. Malen Gauze.  He recommended ED to ED transfer to [facility] for MRI to rule out stroke.  He agreed that Bell's palsy less likely. He would like neurology to be re-paged if MRI positive for stroke.  Disposition: After consideration of the diagnostic results and the patients response to treatment, I feel that patient is requiring MRI to rule out stroke. Out of window for code stroke. As we do not have MRI capabilities today, will transfer ED to ED to [facility] for MRI.  ED physician Dr. Ronnald Nian accepting.  If MRI positive for stroke, neurology team should be consulted and patient admitted.  Final Clinical Impression(s) / ED Diagnoses Final diagnoses:  Facial droop  Difficulty with speech    Rx / DC Orders ED Discharge Orders     None      Portions of this report may have been transcribed using voice recognition software. Every effort was made to ensure accuracy; however, inadvertent computerized transcription errors may be present.    Estill Cotta 03/28/22 1500    Regan Lemming, MD 03/28/22 [phone]

## 2022-03-28 NOTE — Assessment & Plan Note (Signed)
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        Order PT/OT evaluation.        keep nothing by mouth until passes swallow eval         Will make sure patient is on antiplatelet ASA 81  ,  Plavix agent and statin if LDL > 70       Allow permissive Hypertension keep BP <220/120        Neurology consulted Have seen pt in ER  Defer to neurology if further imaging such as contrasted MRI would be useful in the setting of mediastinal mass to rule out metastases

## 2022-03-28 NOTE — Assessment & Plan Note (Addendum)
Will obtain further imaging given hx of breast ca And SOB will obtain CTA to further evaluate CTA negative for PE but there is evidence of mediastinal mass will need further evaluation discussed with patient who is interested in undergoing biopsy if possible Will order IR consult to further evaluate Will discuss with neurology if can hold off on Plavix so pt can have biopsy done

## 2022-03-28 NOTE — ED Provider Notes (Signed)
  Physical Exam  BP (!) 184/81 (BP Location: Right Arm)   Pulse (!) 109   Temp (!) 97.5 F (36.4 C) (Oral)   Resp 18   SpO2 100%   Physical Exam  Procedures  Procedures  ED Course / MDM    Medical Decision Making Amount and/or Complexity of Data Reviewed Labs: ordered. Radiology: ordered.   ***

## 2022-03-29 ENCOUNTER — Inpatient Hospital Stay (HOSPITAL_COMMUNITY): Payer: Medicare HMO

## 2022-03-29 ENCOUNTER — Other Ambulatory Visit: Payer: Self-pay | Admitting: Cardiology

## 2022-03-29 DIAGNOSIS — I10 Essential (primary) hypertension: Secondary | ICD-10-CM | POA: Diagnosis not present

## 2022-03-29 DIAGNOSIS — I63321 Cerebral infarction due to thrombosis of right anterior cerebral artery: Secondary | ICD-10-CM

## 2022-03-29 DIAGNOSIS — I6389 Other cerebral infarction: Secondary | ICD-10-CM

## 2022-03-29 DIAGNOSIS — R29810 Facial weakness: Secondary | ICD-10-CM | POA: Diagnosis not present

## 2022-03-29 DIAGNOSIS — I639 Cerebral infarction, unspecified: Secondary | ICD-10-CM

## 2022-03-29 DIAGNOSIS — J9859 Other diseases of mediastinum, not elsewhere classified: Secondary | ICD-10-CM

## 2022-03-29 DIAGNOSIS — H02401 Unspecified ptosis of right eyelid: Secondary | ICD-10-CM

## 2022-03-29 DIAGNOSIS — R918 Other nonspecific abnormal finding of lung field: Secondary | ICD-10-CM

## 2022-03-29 DIAGNOSIS — R479 Unspecified speech disturbances: Secondary | ICD-10-CM | POA: Diagnosis not present

## 2022-03-29 LAB — LIPID PANEL
Cholesterol: 200 mg/dL (ref 0–200)
HDL: 61 mg/dL (ref >40)
LDL Cholesterol: 125 mg/dL — ABNORMAL HIGH (ref 0–99)
Total CHOL/HDL Ratio: 3.3 RATIO
Triglycerides: 70 mg/dL (ref <150)
VLDL: 14 mg/dL (ref 0–40)

## 2022-03-29 LAB — MAGNESIUM: Magnesium: 2.1 mg/dL (ref 1.7–2.4)

## 2022-03-29 LAB — ECHOCARDIOGRAM COMPLETE
Area-P 1/2: 3.23 cm2
S' Lateral: 1.9 cm

## 2022-03-29 LAB — PHOSPHORUS: Phosphorus: 4 mg/dL (ref 2.5–4.6)

## 2022-03-29 LAB — PREALBUMIN: Prealbumin: 17 mg/dL — ABNORMAL LOW (ref 18–38)

## 2022-03-29 LAB — TSH: TSH: 0.531 u[IU]/mL (ref 0.350–4.500)

## 2022-03-29 LAB — CK: Total CK: 164 U/L (ref 38–234)

## 2022-03-29 MED ORDER — SODIUM CHLORIDE 0.9 % IV SOLN: INTRAVENOUS | Status: AC

## 2022-03-29 MED ORDER — CLOPIDOGREL BISULFATE 75 MG PO TABS: 75.0000 mg | ORAL_TABLET | Freq: Every day | ORAL | Status: DC

## 2022-03-29 MED ORDER — EZETIMIBE 10 MG PO TABS
10.0000 mg | ORAL_TABLET | Freq: Every day | ORAL | Status: DC
  Administered 2022-03-29 – 2022-03-30 (×2): 10 mg via ORAL
  Filled 2022-03-29 (×2): qty 1

## 2022-03-29 MED ORDER — CLOPIDOGREL BISULFATE 75 MG PO TABS
75.0000 mg | ORAL_TABLET | Freq: Every day | ORAL | Status: DC
  Administered 2022-03-29 – 2022-03-30 (×2): 75 mg via ORAL
  Filled 2022-03-29 (×3): qty 1

## 2022-03-29 MED ORDER — ASPIRIN 81 MG PO TBEC
81.0000 mg | DELAYED_RELEASE_TABLET | Freq: Every day | ORAL | Status: DC
  Administered 2022-03-29 – 2022-03-30 (×2): 81 mg via ORAL
  Filled 2022-03-29 (×2): qty 1

## 2022-03-29 MED ORDER — ROSUVASTATIN CALCIUM 20 MG PO TABS
20.0000 mg | ORAL_TABLET | Freq: Every day | ORAL | Status: DC
  Filled 2022-03-29: qty 1

## 2022-03-29 NOTE — Consult Note (Addendum)
NEURO HOSPITALIST CONSULT NOTE   Requesting physician: Dr. Roel Cluck  Reason for Consult: Acute onset of right facial droop  History obtained from:  Patient and Chart     HPI:                                                                                                                                          Faith Massey is an 77 y.o. female with a PMHx of osteoporosis, bilateral mastectomy and back surgery who presented to the [facility] ED on Monday afternoon with intermittent slurred speech, subtle right lower quadrant facial droop and right ptosis in conjunction with hoarseness of voice that began on Saturday. No visual acuity symptoms or double vision endorsed by the patient. Also with no limb weakness or ataxia. 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 She was transferred to [facility] for stroke work up. She denies any recent infectious symptoms including no fever or viral URI symptoms. She does not feel that her vocal hoarseness is due to an infection.   Additional history obtained by EDP at [facility] has been reviewed: "Faith Massey is a 77 y.o. female with history of osteoporosis and hypertension who presents the emergency department complaining of right-sided facial droop for the past 3 days.  Patient was initially told by a friend that it looked like her right eyelid and right side of her mouth were drooping.  Intermittent speech difficulty, where she has difficulty enunciating her words.  She had looked up her symptoms and thought that this could be Bell's palsy, so she did not think much of it.  Family members urged her to be seen.  She called her doctor this morning and they wanted to have a CT and possibly an MRI. She initially went to urgent care this morning, was good to be a 4-hour wait.  She tried to go to another urgent care, who urged her to go to the emergency room. Family history of TIA's. No recent illness."  Past Medical  History:  Diagnosis Date   Osteoporosis 10/12/2012    Past Surgical History:  Procedure Laterality Date   ABDOMINAL HYSTERECTOMY  2005   BACK SURGERY  2000   MASTECTOMY Bilateral 1995    Family History  Problem Relation Age of Onset   Stroke Mother    Hypertension Mother    Hypertension Father               Social History:  reports that she has never smoked. She has never used smokeless tobacco. She reports current alcohol use. She reports that she does not use drugs.  No Known Allergies  MEDICATIONS:  No current facility-administered medications on file prior to encounter.   Current Outpatient Medications on File Prior to Encounter  Medication Sig Dispense Refill   Cholecalciferol (VITAMIN D-3) 5000 UNITS TABS Take 5,000 Units by mouth daily.     Multiple Vitamins-Minerals (MULTIVITAMIN WITH MINERALS) tablet Take 1 tablet by mouth daily.     UNABLE TO FIND Take 1 Scoop by mouth daily. Ancient Nutrition Collagen Powder      Scheduled:   stroke: early stages of recovery book   Does not apply Once   aspirin EC  81 mg Oral Daily   clopidogrel  75 mg Oral Daily   Continuous:  sodium chloride 75 mL/hr at 03/28/22 2200     ROS:                                                                                                                                       As per HPI.    Blood pressure (!) 152/74, pulse 87, temperature 97.9 F (36.6 C), temperature source Oral, resp. rate 19, SpO2 96 %.   General Examination:                                                                                                       Physical Exam  HEENT-  NCAT    Lungs- Respirations unlabored Extremities- No edema   Neurological Examination Mental Status: Alert and fully oriented.  Speech fluent without evidence of aphasia.  Able to follow all commands without difficulty.  Mild hoarseness of vocalizations is noted. No dysarthria.  Cranial Nerves: II: Temporal visual fields intact with no extinction to DSS. PERRL, specifically, no miosis or mydriasis.   III,IV, VI: Mild right sided ptosis. EOMI without nystagmus.   V: Temp sensation equal bilaterally  VII: Smile symmetric VIII: Hearing intact to voice IX,X: Mild hoarseness of vocalizations is noted. XI: Symmetric  XII: Midline tongue extension Motor: BUE 5/5 proximally and distally BLE 5/5 proximally and distally  No pronator drift.  No asymmetry.  Sensory: Temp and light touch intact throughout, bilaterally. No extinction to DSS.  Deep Tendon Reflexes: 2+ and symmetric throughout, except for 4+ left patellar (crossed adductor response) Cerebellar: No ataxia with FNF or H-S bilaterally  Gait: Deferred   Lab Results: Basic Metabolic Panel: Recent Labs  Lab 03/28/22 1338  NA 140  K 3.7  CL 105  CO2 24  GLUCOSE 103*  BUN 17  CREATININE 0.69  CALCIUM 9.2    CBC: Recent Labs  Lab 03/28/22 1338  WBC 14.0*  NEUTROABS 9.3*  HGB 15.2*  HCT 46.4*  MCV 93.9  PLT 283    Cardiac Enzymes: No results for input(s): "CKTOTAL", "CKMB", "CKMBINDEX", "TROPONINI" in the last 168 hours.  Lipid Panel: No results for input(s): "CHOL", "TRIG", "HDL", "CHOLHDL", "VLDL", "LDLCALC" in the last 168 hours.  Imaging: CT Angio Chest Pulmonary Embolism (PE) W or WO Contrast  Result Date: 03/28/2022 CLINICAL DATA:  Stroke, abnormal CT EXAM: CT ANGIOGRAPHY CHEST WITH CONTRAST TECHNIQUE: Multidetector CT imaging of the chest was performed using the standard protocol during bolus administration of intravenous contrast. Multiplanar CT image reconstructions and MIPs were obtained to evaluate the vascular anatomy. RADIATION DOSE REDUCTION: This exam was performed according to the departmental dose-optimization program which includes automated exposure control, adjustment of the mA and/or kV according to patient size  and/or use of iterative reconstruction technique. CONTRAST:  2m OMNIPAQUE IOHEXOL 350 MG/ML SOLN COMPARISON:  CT 03/28/2022 FINDINGS: Cardiovascular: Satisfactory opacification of the pulmonary arteries to the segmental level. No evidence of pulmonary embolism. Nonaneurysmal aorta. Mild atherosclerosis. Normal cardiac size. No pericardial effusion. Mediastinum/Nodes: Midline trachea. No thyroid mass. Esophagus within normal limits. No suspicious lymph nodes. Right anterior mediastinal soft tissue mass, this measures 3 by 4 by 5.3 cm. Lungs/Pleura: Negative for pneumothorax or pleural effusion. Bandlike atelectasis at the lung bases. Upper Abdomen: No acute finding Musculoskeletal: Bilateral breast implants with mild capsular calcification. No acute osseous abnormality. Review of the MIP images confirms the above findings. IMPRESSION: 1. Negative for acute pulmonary embolus. 2. 3 x 4 x 5.3 cm soft tissue mass within the right anterior mediastinum. Mass could represent thymoma or other thymic mass, lymphoma, or metastatic focus. Aortic Atherosclerosis (ICD10-I70.0). Electronically Signed   By: KDonavan FoilM.D.   On: 03/28/2022 22:17   DG Chest 1 View  Result Date: 03/28/2022 CLINICAL DATA:  Stroke EXAM: CHEST  1 VIEW COMPARISON:  Chest radiograph 05/06/2013 FINDINGS: The heart size is normal. There is fullness is in the upper mediastinum and right hilum suspicious for mass lesion given findings on CTA head/neck from earlier the same day. Linear opacities in the lung bases likely reflect platelike atelectasis. There is no other focal airspace disease. There is no pulmonary edema. There is no pleural effusion or pneumothorax There is no acute osseous abnormality. IMPRESSION: Fullness in the upper mediastinum and right hilum suspicious for mass lesion given findings on same day CTA head/neck. Recommend CT chest with contrast for further evaluation. Electronically Signed   By: PValetta MoleM.D.   On: 03/28/2022  20:05   CT ANGIO HEAD NECK W WO CM  Result Date: 03/28/2022 CLINICAL DATA:  Intermittent slurred speech and partial right facial droop, symptoms for 3 days. EXAM: CT ANGIOGRAPHY HEAD AND NECK TECHNIQUE: Multidetector CT imaging of the head and neck was performed using the standard protocol during bolus administration of intravenous contrast. Multiplanar CT image reconstructions and MIPs were obtained to evaluate the vascular anatomy. Carotid stenosis measurements (when applicable) are obtained utilizing NASCET criteria, using the distal internal carotid diameter as the denominator. RADIATION DOSE REDUCTION: This exam was performed according to the departmental dose-optimization program which includes automated exposure control, adjustment of the mA and/or kV according to patient size and/or use of iterative reconstruction technique. CONTRAST:  731mOMNIPAQUE IOHEXOL 350 MG/ML SOLN COMPARISON:  Brain MRI from earlier today FINDINGS: CTA NECK FINDINGS Aortic arch: Unremarkable Right carotid system: Vessels are smoothly contoured and widely patent. Partial retropharyngeal course Left carotid  system: Vessels are smoothly contoured and widely patent. Partial retropharyngeal course Vertebral arteries: No proximal subclavian or vertebral stenosis or beading. Skeleton: No acute finding.  Generalized cervical spine degeneration Other neck: No acute finding Upper chest: Lobulated soft tissue density in the anterior mediastinum, partially covered and measuring 4.3 cm, appearance not typical for pericardial recess. Review of the MIP images confirms the above findings CTA HEAD FINDINGS Anterior circulation: Vessels are smoothly contoured and widely patent. No notable atheromatous change for age, only mild calcification at the carotid siphons. No branch occlusion, beading, or aneurysm. Posterior circulation:  Fetal type PCA flow with Venous sinuses: As permitted by contrast timing, patent. Anatomic variants: As above Review of  the MIP images confirms the above findings IMPRESSION: 1. 4.3 cm partially covered mass or collection in the anterior mediastinum, recommend chest CT with contrast. 2. No emergent vascular finding. Mild for age atherosclerosis without stenosis or plaque irregularity. Electronically Signed   By: Jorje Guild M.D.   On: 03/28/2022 19:50   MR BRAIN WO CONTRAST  Result Date: 03/28/2022 CLINICAL DATA:  Stroke suspected. EXAM: MRI HEAD WITHOUT CONTRAST TECHNIQUE: Multiplanar, multiecho pulse sequences of the brain and surrounding structures were obtained without intravenous contrast. COMPARISON:  Same-day CT head FINDINGS: Brain: There is a small focus of linear diffusion restriction in the right frontal subcortical white matter consistent with acute infarct. There is no associated hemorrhage or mass effect. There is no other evidence of acute infarct. Parenchymal volume is normal for age. The ventricles are normal in size. Gray-white differentiation is preserved. Patchy and confluence FLAIR signal abnormality in the supratentorial white matter is nonspecific but likely reflects sequela of moderate chronic small-vessel ischemic change. 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 The pituitary and suprasellar region are normal. There is no mass lesion. There is no mass effect or midline shift. Vascular: Normal flow voids. Skull and upper cervical spine: Normal marrow signal. Sinuses/Orbits: The paranasal sinuses are clear. A left lens implant is in place. The globes and orbits are otherwise unremarkable. Other: None. 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 Electronically Signed   By: Valetta Mole M.D.   On: 03/28/2022 17:38   CT HEAD WO CONTRAST  Result Date: 03/28/2022 CLINICAL DATA:  Neurologic deficit. EXAM: CT HEAD WITHOUT CONTRAST TECHNIQUE: Contiguous axial images were obtained from the base of the skull through the vertex  without intravenous contrast. RADIATION DOSE REDUCTION: This exam was performed according to the departmental dose-optimization program which includes automated exposure control, adjustment of the mA and/or kV according to patient size and/or use of iterative reconstruction technique. COMPARISON:  None Available. FINDINGS: Brain: There is age-related atrophy and chronic microvascular ischemic changes. There is no acute intracranial hemorrhage. No mass effect or midline shift. No extra-axial fluid collection. Vascular: No hyperdense vessel or unexpected calcification. Skull: Normal. Negative for fracture or focal lesion. Sinuses/Orbits: No acute finding. Other: None IMPRESSION: 1. No acute intracranial pathology. 2. Age-related atrophy and chronic microvascular ischemic changes. Electronically Signed   By: Anner Crete M.D.   On: 03/28/2022 14:05     Assessment: 77 year old female presenting with new onset of right ptosis and hoarseness. MRI brain reveals a small subacute ischemic infarction involving the right motor strip - Exam reveals right ptosis and hoarseness. No miosis or ocular motility deficit seen. No ataxia, facial droop, limb weakness or sensory deficit noted.  - CT head:  No acute  intracranial pathology. 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 - CTA of head and neck: No emergent vascular finding. Mild for age atherosclerosis without stenosis or plaque irregularity. - Lung imaging summary of results: 4.3 cm partially covered mass or collection in the anterior mediastinum, on CTA of head and neck. CXR reveals fullness in the upper mediastinum and right hilum suspicious for mass lesion given findings on same day CTA head/neck. CTA of chest PE protocol reveals no PE, but a 3 x 4 x 5.3 cm soft tissue mass within the right anterior mediastinum is seen, which could represent  thymoma or other thymic mass, lymphoma, or metastatic focus. - The right sided location of her stroke does not explain her recent symptoms of right facial droop. 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  - She will therefore need both stroke work up as well as further evaluation for her right hilar and mediastinal mass. If the mass is infiltrating the sympathetic chain on the right as well as her recurrent laryngeal nerve, then her hoarseness as well as her right ptosis would likely be best explained by an infiltrative process.   Recommendations: - HgbA1c, fasting lipid panel - PT consult, OT consult, Speech consult - TTE - Atorvastatin 40 mg po qd. Obtain baseline CK level.  - Agree with starting DAPT. Continue ASA and Plavix x 21 days, then discontinue Plavix and continue ASA indefinitely.  - Risk factor modification - Telemetry monitoring - Frequent neuro checks - NPO until passes stroke swallow screen - BP management. Out of the permissive HTN time window.  - Pulmonology consult - Speech Therapy to assess for possible right vocal cord paresis.    Electronically signed: Dr. Kerney Elbe 03/29/2022, 3:35 AM

## 2022-03-29 NOTE — Progress Notes (Addendum)
[facility] PROGRESS NOTE   Faith Massey BSW:[id number] DOB: June 22, 1945 DOA: 03/28/2022  PCP: Patient, No Pcp Per  Brief History/Interval Summary:  77 y.o. female with medical history significant of HTN, breast cancer sp double mastectomy 1990 who presented with right-sided facial droop and difficulty finding words.  There was also history of intermittent slurred speech.  Patient presented to the emergency department.  Found to have acute stroke.  Hospitalized for further management.  Also found to have a mediastinal mass.  Consultants: Neurology.  Will consult pulmonology.  Interventional radiology.  Procedures: Echocardiogram is pending    Subjective/Interval History: Patient mentions that occasionally she will have hoarse voice.  Continues to have a little bit of difficulty finding words when she is speaking.  Denies any chest pain shortness of breath nausea or vomiting.  Denies any history of significant smoking.  She smoked for a few months when she was in college but not since then.  Denies any history of alcohol use on a regular basis.  Denies any weight loss in the last few months.    Assessment/Plan:  Acute versus subacute stroke MRI brain reviewed subacute infarct in the right hemisphere. Neurology is following. LDL 125.  Patient will need to be started on statin. Patient currently on aspirin and Plavix. HbA1c 5.7. Echocardiogram is pending. PT OT evaluation is pending.  Speech therapy evaluation.  Mediastinal mass Previous history of breast cancer noted.  Concern for Pancoast tumor secondary to neurological symptoms. Interventional radiology consulted by admitting provider. Will request pulmonology to weigh in as well. May need to do CT of the abdomen pelvis at some point in time but will first wait for Pulm and IR input.  Essential hypertension Allowing permissive hypertension.  No antihypertensives listed in her home medication list. Blood pressure is noted to  be elevated.  Will need definitive management once cleared by neurology.  DVT Prophylaxis: SCDs for now Code Status: Full code Family Communication: Discussed with patient.  No family at bedside Disposition Plan: To be determined  Status is: Inpatient Remains inpatient appropriate because: Acute stroke      Medications: Scheduled:   stroke: early stages of recovery book   Does not apply Once   aspirin EC  81 mg Oral Daily   clopidogrel  75 mg Oral Daily   Continuous:  sodium chloride 75 mL/hr at 03/29/22 0617   GYK:[id number] **OR** acetaminophen (TYLENOL) oral liquid 160 mg/5 mL **OR** acetaminophen  Antibiotics: Anti-infectives (From admission, onward)    None       Objective:  Vital Signs  Vitals:   03/29/22 0500 03/29/22 0513 03/29/22 0600 03/29/22 0800  BP: 128/69  (!) 158/81 (!) 180/97  Pulse: 86 94 90 95  Resp: (!) '24 17 16 19  '$ Temp:  98 F (36.7 C)    TempSrc:      SpO2: 94% 97% 94% 100%   No intake or output data in the 24 hours ending 03/29/22 0834 There were no vitals filed for this visit.  General appearance: Awake alert.  In no distress Resp: Clear to auscultation bilaterally.  Normal effort Cardio: S1-S2 is normal regular.  No S3-S4.  No rubs murmurs or bruit GI: Abdomen is soft.  Nontender nondistended.  Bowel sounds are present normal.  No masses organomegaly Extremities: No edema.  Full range of motion of lower extremities. Neurologic: Alert and oriented x3.  No facial asymmetry noted.  Motor strength equal bilateral upper and lower extremities.   Lab Results:  Data  Reviewed: I have personally reviewed following labs and reports of the imaging studies  CBC: Recent Labs  Lab 03/28/22 1338  WBC 14.0*  NEUTROABS 9.3*  HGB 15.2*  HCT 46.4*  MCV 93.9  PLT 101    Basic Metabolic Panel: Recent Labs  Lab 03/28/22 1338 03/29/22 0512  NA 140  --   K 3.7  --   CL 105  --   CO2 24  --   GLUCOSE 103*  --   BUN 17  --    CREATININE 0.69  --   CALCIUM 9.2  --   MG  --  2.1  PHOS  --  4.0    GFR: CrCl cannot be calculated (Unknown ideal weight.).  Liver Function Tests: Recent Labs  Lab 03/28/22 1338  AST 40  ALT 56*  ALKPHOS 82  BILITOT 0.7  PROT 6.9  ALBUMIN 4.1     Coagulation Profile: Recent Labs  Lab 03/28/22 1338  INR 1.0    Cardiac Enzymes: Recent Labs  Lab 03/29/22 0512  CKTOTAL 164     HbA1C: Recent Labs    03/28/22 2232  HGBA1C 5.7*    CBG: Recent Labs  Lab 03/28/22 1350  GLUCAP 87    Lipid Profile: Recent Labs    03/29/22 0512  CHOL 200  HDL 61  LDLCALC 125*  TRIG 70  CHOLHDL 3.3    Thyroid Function Tests: Recent Labs    03/29/22 0512  TSH 0.531    Radiology Studies: CT Angio Chest Pulmonary Embolism (PE) W or WO Contrast  Result Date: 03/28/2022 CLINICAL DATA:  Stroke, abnormal CT EXAM: CT ANGIOGRAPHY CHEST WITH CONTRAST TECHNIQUE: Multidetector CT imaging of the chest was performed using the standard protocol during bolus administration of intravenous contrast. Multiplanar CT image reconstructions and MIPs were obtained to evaluate the vascular anatomy. RADIATION DOSE REDUCTION: This exam was performed according to the departmental dose-optimization program which includes automated exposure control, adjustment of the mA and/or kV according to patient size and/or use of iterative reconstruction technique. CONTRAST:  75m OMNIPAQUE IOHEXOL 350 MG/ML SOLN COMPARISON:  CT 03/28/2022 FINDINGS: Cardiovascular: Satisfactory opacification of the pulmonary arteries to the segmental level. No evidence of pulmonary embolism. Nonaneurysmal aorta. Mild atherosclerosis. Normal cardiac size. No pericardial effusion. Mediastinum/Nodes: Midline trachea. No thyroid mass. Esophagus within normal limits. No suspicious lymph nodes. Right anterior mediastinal soft tissue mass, this measures 3 by 4 by 5.3 cm. Lungs/Pleura: Negative for pneumothorax or pleural effusion.  Bandlike atelectasis at the lung bases. Upper Abdomen: No acute finding Musculoskeletal: Bilateral breast implants with mild capsular calcification. No acute osseous abnormality. Review of the MIP images confirms the above findings. IMPRESSION: 1. Negative for acute pulmonary embolus. 2. 3 x 4 x 5.3 cm soft tissue mass within the right anterior mediastinum. Mass could represent thymoma or other thymic mass, lymphoma, or metastatic focus. Aortic Atherosclerosis (ICD10-I70.0). Electronically Signed   By: KDonavan FoilM.D.   On: 03/28/2022 22:17   DG Chest 1 View  Result Date: 03/28/2022 CLINICAL DATA:  Stroke EXAM: CHEST  1 VIEW COMPARISON:  Chest radiograph 05/06/2013 FINDINGS: The heart size is normal. There is fullness is in the upper mediastinum and right hilum suspicious for mass lesion given findings on CTA head/neck from earlier the same day. Linear opacities in the lung bases likely reflect platelike atelectasis. There is no other focal airspace disease. There is no pulmonary edema. There is no pleural effusion or pneumothorax There is no acute osseous abnormality. IMPRESSION: Fullness  in the upper mediastinum and right hilum suspicious for mass lesion given findings on same day CTA head/neck. Recommend CT chest with contrast for further evaluation. Electronically Signed   By: Valetta Mole M.D.   On: 03/28/2022 20:05   CT ANGIO HEAD NECK W WO CM  Result Date: 03/28/2022 CLINICAL DATA:  Intermittent slurred speech and partial right facial droop, symptoms for 3 days. EXAM: CT ANGIOGRAPHY HEAD AND NECK TECHNIQUE: Multidetector CT imaging of the head and neck was performed using the standard protocol during bolus administration of intravenous contrast. Multiplanar CT image reconstructions and MIPs were obtained to evaluate the vascular anatomy. Carotid stenosis measurements (when applicable) are obtained utilizing NASCET criteria, using the distal internal carotid diameter as the denominator. RADIATION DOSE  REDUCTION: This exam was performed according to the departmental dose-optimization program which includes automated exposure control, adjustment of the mA and/or kV according to patient size and/or use of iterative reconstruction technique. CONTRAST:  26m OMNIPAQUE IOHEXOL 350 MG/ML SOLN COMPARISON:  Brain MRI from earlier today FINDINGS: CTA NECK FINDINGS Aortic arch: Unremarkable Right carotid system: Vessels are smoothly contoured and widely patent. Partial retropharyngeal course Left carotid system: Vessels are smoothly contoured and widely patent. Partial retropharyngeal course Vertebral arteries: No proximal subclavian or vertebral stenosis or beading. Skeleton: No acute finding.  Generalized cervical spine degeneration Other neck: No acute finding Upper chest: Lobulated soft tissue density in the anterior mediastinum, partially covered and measuring 4.3 cm, appearance not typical for pericardial recess. Review of the MIP images confirms the above findings CTA HEAD FINDINGS Anterior circulation: Vessels are smoothly contoured and widely patent. No notable atheromatous change for age, only mild calcification at the carotid siphons. No branch occlusion, beading, or aneurysm. Posterior circulation:  Fetal type PCA flow with Venous sinuses: As permitted by contrast timing, patent. Anatomic variants: As above Review of the MIP images confirms the above findings IMPRESSION: 1. 4.3 cm partially covered mass or collection in the anterior mediastinum, recommend chest CT with contrast. 2. No emergent vascular finding. Mild for age atherosclerosis without stenosis or plaque irregularity. Electronically Signed   By: JJorje GuildM.D.   On: 03/28/2022 19:50   MR BRAIN WO CONTRAST  Result Date: 03/28/2022 CLINICAL DATA:  Stroke suspected. EXAM: MRI HEAD WITHOUT CONTRAST TECHNIQUE: Multiplanar, multiecho pulse sequences of the brain and surrounding structures were obtained without intravenous contrast. COMPARISON:   Same-day CT head FINDINGS: Brain: There is a small focus of linear diffusion restriction in the right frontal subcortical white matter consistent with acute infarct. There is no associated hemorrhage or mass effect. There is no other evidence of acute infarct. Parenchymal volume is normal for age. The ventricles are normal in size. Gray-white differentiation is preserved. Patchy and confluence FLAIR signal abnormality in the supratentorial white matter is nonspecific but likely reflects sequela of moderate chronic small-vessel ischemic change. 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 The pituitary and suprasellar region are normal. There is no mass lesion. There is no mass effect or midline shift. Vascular: Normal flow voids. Skull and upper cervical spine: Normal marrow signal. Sinuses/Orbits: The paranasal sinuses are clear. A left lens implant is in place. The globes and orbits are otherwise unremarkable. Other: None. 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 Electronically Signed   By: PValetta MoleM.D.   On: 03/28/2022 17:38   CT HEAD WO CONTRAST  Result Date: 03/28/2022 CLINICAL DATA:  Neurologic deficit. EXAM:  CT HEAD WITHOUT CONTRAST TECHNIQUE: Contiguous axial images were obtained from the base of the skull through the vertex without intravenous contrast. RADIATION DOSE REDUCTION: This exam was performed according to the departmental dose-optimization program which includes automated exposure control, adjustment of the mA and/or kV according to patient size and/or use of iterative reconstruction technique. COMPARISON:  None Available. FINDINGS: Brain: There is age-related atrophy and chronic microvascular ischemic changes. There is no acute intracranial hemorrhage. No mass effect or midline shift. No extra-axial fluid collection. Vascular: No hyperdense vessel or unexpected calcification. Skull: Normal. Negative for fracture  or focal lesion. Sinuses/Orbits: No acute finding. Other: None IMPRESSION: 1. No acute intracranial pathology. 2. Age-related atrophy and chronic microvascular ischemic changes. Electronically Signed   By: Anner Crete M.D.   On: 03/28/2022 14:05       LOS: 1 day   [facility] Pager on [email or web address]  03/29/2022, 8:34 AM

## 2022-03-29 NOTE — Evaluation (Signed)
Occupational Therapy Evaluation Patient Details Name: Faith Massey MRN: [id number] DOB: [birth year] Today's Date: 03/29/2022   History of Present Illness Faith Massey is an 77 y.o. 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 PMHx includes osteoporosis, bilateral mastectomy and back surgery   Clinical Impression   Pt typically independent in ADL and mobility. She lives alone with her dog "Juliann Pulse" and has family close by if she needs anything. Today she is able to demonstrate UB and LB ADL, sink level grooming, transfers and ambulation at independent level. She does have ptosis on R eye - but she is able to function with vision without any issues. Pt education complete and OT will sign off at this time. Pt with no questions or concerns at the end of session.       Recommendations for follow up therapy are one component of a multi-disciplinary discharge planning process, led by the attending physician.  Recommendations may be updated based on patient status, additional functional criteria and insurance authorization.   Follow Up Recommendations  No OT follow up     Assistance Recommended at Discharge None  Patient can return home with the following Other (comment) (no restrictions)    Functional Status Assessment  Patient has not had a recent decline in their functional status  Equipment Recommendations  None recommended by OT    Recommendations for Other Services       Precautions / Restrictions Precautions Precautions:  (watch BP) Restrictions Weight Bearing Restrictions: No      Mobility Bed Mobility Overal bed mobility: Independent                  Transfers Overall transfer level: Independent                        Balance Overall balance assessment: No apparent  balance deficits (not formally assessed)                                         ADL either performed or assessed with clinical judgement   ADL Overall ADL's : Independent                                       General ADL Comments: able to perform UB and LB ADL, sink level grooming and transfers/ambulation without assist     Vision Baseline Vision/History: 1 Wears glasses Ability to See in Adequate Light: 0 Adequate Patient Visual Report: No change from baseline Vision Assessment?: No apparent visual deficits Additional Comments: navigated room and hallway without problem. She does have ptosis which impacts her ability to see some - but functionally WFL.     Perception     Praxis      Pertinent Vitals/Pain Pain Assessment Pain Assessment: No/denies pain     Hand Dominance Right   Extremity/Trunk Assessment Upper Extremity Assessment Upper Extremity Assessment: Overall WFL for tasks assessed   Lower Extremity Assessment Lower Extremity Assessment: Defer to PT evaluation   Cervical / Trunk Assessment Cervical / Trunk Assessment: Normal   Communication Communication Communication: No difficulties   Cognition Arousal/Alertness: Awake/alert Behavior During Therapy: WFL for tasks  assessed/performed Overall Cognitive Status: Within Functional Limits for tasks assessed                                 General Comments: a bit tangential and story telling personality - but on topic and appropriate     General Comments  186/95 HR 101 supine  184/94 sitting  178/106 (127) standing  182/95 (120) standing x3 min  122 HR with mobility  173/89 (113) after hallway ambulation    Exercises     Shoulder Instructions      Home Living Family/patient expects to be discharged to:: Private residence Living Arrangements: Alone (dog) Available Help at Discharge: Family;Available PRN/intermittently Type of Home: Other(Comment) Home  Access:  (townhome)     Home Layout: One level     Bathroom Shower/Tub: Tub/shower unit;Curtain   Biochemist, clinical:  (Comfort) Bathroom Accessibility: No   Home Equipment: None          Prior Functioning/Environment Prior Level of Function : Independent/Modified Independent                        OT Problem List: Impaired sensation      OT Treatment/Interventions:      OT Goals(Current goals can be found in the care plan section) Acute Rehab OT Goals Patient Stated Goal: get home to dog Juliann Pulse) OT Goal Formulation: With patient Time For Goal Achievement: 04/12/22 Potential to Achieve Goals: Good  OT Frequency:      Co-evaluation              AM-PAC OT "6 Clicks" Daily Activity     Outcome Measure Help from another person eating meals?: None Help from another person taking care of personal grooming?: None Help from another person toileting, which includes using toliet, bedpan, or urinal?: None Help from another person bathing (including washing, rinsing, drying)?: None Help from another person to put on and taking off regular upper body clothing?: None Help from another person to put on and taking off regular lower body clothing?: None 6 Click Score: 24   End of Session Equipment Utilized During Treatment: Gait belt Nurse Communication: Mobility status  Activity Tolerance: Patient tolerated treatment well Patient left: in bed;with call bell/phone within reach;Other (comment) (ED stretcher)  OT Visit Diagnosis: Muscle weakness (generalized) (M62.81)                Time: 7342-8768 OT Time Calculation (min): 23 min Charges:  OT General Charges $OT Visit: 1 Visit OT Evaluation $OT Eval Low Complexity: [place] OTR/L Acute Rehabilitation Services Office: [place] 03/29/2022, 12:44 PM

## 2022-03-29 NOTE — Progress Notes (Signed)
Lower extremity venous bilateral study completed.   Please see CV Proc for preliminary results.   Marleny Faller, RDMS, RVT  

## 2022-03-29 NOTE — ED Notes (Signed)
ED TO INPATIENT HANDOFF REPORT  ED Nurse Name and Phone #: Newman Pies [phone]  S Name/Age/Gender Faith Massey 77 y.o. female Room/Bed: 007C/007C  Code Status   Code Status: Full Code  Home/SNF/Other Home Patient oriented to: self, place, time, and situation Is this baseline? Yes   Triage Complete: Triage complete  Chief Complaint CVA (cerebral vascular accident) [facility]) [I63.9]  Triage Note Intermittent slurred speech , partial right facial droop, all started 3 days ago  Alert and oriented x 4. Ambulatory to triage room with steady gait . No headache or other neuro deficit . No Hx HTN or Bell's Palsy    Allergies No Known Allergies  Level of Care/Admitting Diagnosis ED Disposition     ED Disposition  Admit   Condition  --   Comment  Hospital Area: [facility] [[id number]]  Level of Care: Telemetry Medical [[id number]]  May admit patient to [facility] or [facility] if equivalent level of care is available:: No  Covid Evaluation: Asymptomatic - no recent exposure (last 10 days) testing not required  Diagnosis: CVA (cerebral vascular accident) [facility]) [[id number]]  Admitting Physician: Toy Baker [[id number]]  Attending Physician: Toy Baker [[id number]]  Certification:: I certify this patient will need inpatient services for at least 2 midnights  Estimated Length of Stay: 2          B Medical/Surgery History Past Medical History:  Diagnosis Date   Osteoporosis 10/12/2012   Past Surgical History:  Procedure Laterality Date   ABDOMINAL HYSTERECTOMY  2005   [place]   MASTECTOMY Bilateral 1995     A IV Location/Drains/Wounds Patient Lines/Drains/Airways Status     Active Line/Drains/Airways     Name Placement date Placement time Site Days   Peripheral IV 03/28/22 20 G Left Antecubital 03/28/22  1330  Antecubital  1            Intake/Output Last 24 hours  Intake/Output Summary (Last 24 hours) at 03/29/2022 1514 Last data  filed at 03/29/2022 1351 Gross per 24 hour  Intake 200.68 ml  Output --  Net 200.68 ml    Labs/Imaging Results for orders placed or performed during the hospital encounter of 03/28/22 (from the past 48 hour(s))  Protime-INR     Status: None   Collection Time: 03/28/22  1:38 PM  Result Value Ref Range   Prothrombin Time 13.0 11.4 - 15.2 seconds   INR 1.0 0.8 - 1.2    Comment: (NOTE) INR goal varies based on device and disease states. Performed at [facility], [address] [place], [place] [address]   APTT     Status: None   Collection Time: 03/28/22  1:38 PM  Result Value Ref Range   aPTT 28 24 - 36 seconds    Comment: Performed at [facility], [address] [place], [place] [address]  CBC     Status: Abnormal   Collection Time: 03/28/22  1:38 PM  Result Value Ref Range   WBC 14.0 (H) 4.0 - 10.5 K/uL   RBC 4.94 3.87 - 5.11 MIL/uL   Hemoglobin 15.2 (H) 12.0 - 15.0 g/dL   HCT 46.4 (H) 36.0 - 46.0 %   MCV 93.9 80.0 - 100.0 fL   MCH 30.8 26.0 - 34.0 pg   MCHC 32.8 30.0 - 36.0 g/dL   RDW 15.0 11.5 - 15.5 %   Platelets 283 150 - 400 K/uL   nRBC 0.0 0.0 - 0.2 %  Comment: Performed at [facility], [address] [place], [place] [address]  Differential     Status: Abnormal   Collection Time: 03/28/22  1:38 PM  Result Value Ref Range   Neutrophils Relative % 66 %   Neutro Abs 9.3 (H) 1.7 - 7.7 K/uL   Lymphocytes Relative 22 %   Lymphs Abs 3.1 0.7 - 4.0 K/uL   Monocytes Relative 10 %   Monocytes Absolute 1.5 (H) 0.1 - 1.0 K/uL   Eosinophils Relative 1 %   Eosinophils Absolute 0.1 0.0 - 0.5 K/uL   Basophils Relative 1 %   Basophils Absolute 0.1 0.0 - 0.1 K/uL   Immature Granulocytes 0 %   Abs Immature Granulocytes 0.04 0.00 - 0.07 K/uL    Comment: Performed at [facility], [address] [place], [place] [address]  Comprehensive metabolic panel     Status: Abnormal   Collection Time: 03/28/22  1:38 PM  Result Value Ref  Range   Sodium 140 135 - 145 mmol/L   Potassium 3.7 3.5 - 5.1 mmol/L   Chloride 105 98 - 111 mmol/L   CO2 24 22 - 32 mmol/L   Glucose, Bld 103 (H) 70 - 99 mg/dL    Comment: Glucose reference range applies only to samples taken after fasting for at least 8 hours.   BUN 17 8 - 23 mg/dL   Creatinine, Ser 0.69 0.44 - 1.00 mg/dL   Calcium 9.2 8.9 - 10.3 mg/dL   Total Protein 6.9 6.5 - 8.1 g/dL   Albumin 4.1 3.5 - 5.0 g/dL   AST 40 15 - 41 U/L   ALT 56 (H) 0 - 44 U/L   Alkaline Phosphatase 82 38 - 126 U/L   Total Bilirubin 0.7 0.3 - 1.2 mg/dL   GFR, Estimated >60 >60 mL/min    Comment: (NOTE) Calculated using the CKD-EPI Creatinine Equation (2021)    Anion gap 11 5 - 15    Comment: Performed at [facility], [address] [place], [place] [address]  Ethanol     Status: None   Collection Time: 03/28/22  1:38 PM  Result Value Ref Range   Alcohol, Ethyl (B) <10 <10 mg/dL    Comment: (NOTE) Lowest detectable limit for serum alcohol is 10 mg/dL.  For medical purposes only. Performed at [facility], [address] [place], [place] [address]   CBG monitoring, ED     Status: None   Collection Time: 03/28/22  1:50 PM  Result Value Ref Range   Glucose-Capillary 87 70 - 99 mg/dL    Comment: Glucose reference range applies only to samples taken after fasting for at least 8 hours.  Urinalysis, Complete w Microscopic Urine, Clean Catch     Status: Abnormal   Collection Time: 03/28/22 10:09 PM  Result Value Ref Range   Color, Urine STRAW (A) YELLOW   APPearance CLEAR CLEAR   Specific Gravity, Urine 1.025 1.005 - 1.030   pH 7.0 5.0 - 8.0   Glucose, UA NEGATIVE NEGATIVE mg/dL   Hgb urine dipstick NEGATIVE NEGATIVE   Bilirubin Urine NEGATIVE NEGATIVE   Ketones, ur NEGATIVE NEGATIVE mg/dL   Protein, ur NEGATIVE NEGATIVE mg/dL   Nitrite NEGATIVE NEGATIVE   Leukocytes,Ua TRACE (A) NEGATIVE   RBC / HPF 0-5 0 - 5 RBC/hpf   WBC, UA 0-5 0 - 5 WBC/hpf   Bacteria, UA  NONE SEEN NONE SEEN   Squamous Epithelial /  HPF 0-5 0 - 5 /HPF    Comment: Performed at [facility], [place] [address], [place], [place] [address]  Hemoglobin A1c     Status: Abnormal   Collection Time: 03/28/22 10:32 PM  Result Value Ref Range   Hgb A1c MFr Bld 5.7 (H) 4.8 - 5.6 %    Comment: (NOTE) Pre diabetes:          5.7%-6.4%  Diabetes:              >6.4%  Glycemic control for   <7.0% adults with diabetes    Mean Plasma Glucose 116.89 mg/dL    Comment: Performed at [facility] [address], [place], [place] [address]  TSH     Status: None   Collection Time: 03/29/22  5:12 AM  Result Value Ref Range   TSH 0.531 0.350 - 4.500 uIU/mL    Comment: Performed by a 3rd Generation assay with a functional sensitivity of <=0.01 uIU/mL. Performed at [facility], [place] [address] , [place] [address]   CK     Status: None   Collection Time: 03/29/22  5:12 AM  Result Value Ref Range   Total CK 164 38 - 234 U/L    Comment: Performed at [facility], [place] [address] [place], [place] [address]  Magnesium     Status: None   Collection Time: 03/29/22  5:12 AM  Result Value Ref Range   Magnesium 2.1 1.7 - 2.4 mg/dL    Comment: Performed at [facility] [address] [place], [place] [address]  Phosphorus     Status: None   Collection Time: 03/29/22  5:12 AM  Result Value Ref Range   Phosphorus 4.0 2.5 - 4.6 mg/dL    Comment: Performed at [place] [address], [place], [place] [address]  Prealbumin     Status: Abnormal   Collection Time: 03/29/22  5:12 AM  Result Value Ref Range   Prealbumin 17 (L) 18 - 38 mg/dL    Comment: Performed at [facility] [address], [place],  [address]  Lipid panel     Status: Abnormal   Collection Time: 03/29/22  5:12 AM  Result Value Ref Range   Cholesterol 200 0 - 200 mg/dL   Triglycerides 70 <150 mg/dL   HDL 61 >40 mg/dL   Total CHOL/HDL Ratio 3.3 RATIO   VLDL 14 0 - 40 mg/dL    LDL Cholesterol 125 (H) 0 - 99 mg/dL    Comment:        Total Cholesterol/HDL:CHD Risk Coronary Heart Disease Risk Table                     Men   Women  1/2 Average Risk   3.4   3.3  Average Risk       5.0   4.4  2 X Average Risk   9.6   7.1  3 X Average Risk  23.4   11.0        Use the calculated Patient Ratio above and the CHD Risk Table to determine the patient's CHD Risk.        ATP III CLASSIFICATION (LDL):  <100     mg/dL   Optimal  100-129  mg/dL   Near or Above                    Optimal  130-159  mg/dL   Borderline  160-189  mg/dL   High  >190     mg/dL   Very High Performed at [facility] [address] [place], [place] [address]    CT Angio Chest Pulmonary Embolism (PE) W or WO Contrast  Result Date: 03/28/2022 CLINICAL DATA:  Stroke, abnormal CT EXAM: CT ANGIOGRAPHY CHEST WITH CONTRAST TECHNIQUE: Multidetector CT imaging of the chest was performed using the standard protocol during bolus administration of intravenous contrast. Multiplanar CT image reconstructions and MIPs were obtained to evaluate the vascular anatomy. RADIATION DOSE REDUCTION: This exam was performed according to the departmental dose-optimization program which includes automated exposure control, adjustment of the mA and/or kV according to patient size and/or use of iterative reconstruction technique. CONTRAST:  21m OMNIPAQUE IOHEXOL 350 MG/ML SOLN COMPARISON:  CT 03/28/2022 FINDINGS: Cardiovascular: Satisfactory opacification of the pulmonary arteries to the segmental level. No evidence of pulmonary embolism. Nonaneurysmal aorta. Mild atherosclerosis. Normal cardiac size. No pericardial effusion. Mediastinum/Nodes: Midline trachea. No thyroid mass. Esophagus within normal limits. No suspicious lymph nodes. Right anterior mediastinal soft tissue mass, this measures 3 by 4 by 5.3 cm. Lungs/Pleura: Negative for pneumothorax or pleural effusion. Bandlike atelectasis at the lung bases. Upper Abdomen:  No acute finding Musculoskeletal: Bilateral breast implants with mild capsular calcification. No acute osseous abnormality. Review of the MIP images confirms the above findings. IMPRESSION: 1. Negative for acute pulmonary embolus. 2. 3 x 4 x 5.3 cm soft tissue mass within the right anterior mediastinum. Mass could represent thymoma or other thymic mass, lymphoma, or metastatic focus. Aortic Atherosclerosis (ICD10-I70.0). Electronically Signed   By: KDonavan FoilM.D.   On: 03/28/2022 22:17   DG Chest 1 View  Result Date: 03/28/2022 CLINICAL DATA:  Stroke EXAM: CHEST  1 VIEW COMPARISON:  Chest radiograph 05/06/2013 FINDINGS: The heart size is normal. There is fullness is in the upper mediastinum and right hilum suspicious for mass lesion given findings on CTA head/neck from earlier the same day. Linear opacities in the lung bases likely reflect platelike atelectasis. There is no other focal airspace disease. There is no pulmonary edema. There is no pleural effusion or pneumothorax There is no acute osseous abnormality. IMPRESSION: Fullness in the upper mediastinum and right hilum suspicious for mass lesion given findings on same day CTA head/neck. Recommend CT chest with contrast for further evaluation. Electronically Signed   By: PValetta MoleM.D.   On: 03/28/2022 20:05   CT ANGIO HEAD NECK W WO CM  Result Date: 03/28/2022 CLINICAL DATA:  Intermittent slurred speech and partial right facial droop, symptoms for 3 days. EXAM: CT ANGIOGRAPHY HEAD AND NECK TECHNIQUE: Multidetector CT imaging of the head and neck was performed using the standard protocol during bolus administration of intravenous contrast. Multiplanar CT image reconstructions and MIPs were obtained to evaluate the vascular anatomy. Carotid stenosis measurements (when applicable) are obtained utilizing NASCET criteria, using the distal internal carotid diameter as the denominator. RADIATION DOSE REDUCTION: This exam was performed according to the  departmental dose-optimization program which includes automated exposure control, adjustment of the mA and/or kV according to patient size and/or use of iterative reconstruction technique. CONTRAST:  730mOMNIPAQUE IOHEXOL 350 MG/ML SOLN COMPARISON:  Brain MRI from earlier today FINDINGS: CTA NECK FINDINGS Aortic arch: Unremarkable Right carotid system: Vessels are smoothly contoured and widely patent. Partial retropharyngeal course Left carotid system: Vessels are smoothly contoured and widely patent. Partial retropharyngeal course Vertebral arteries: No proximal subclavian or vertebral stenosis  or beading. Skeleton: No acute finding.  Generalized cervical spine degeneration Other neck: No acute finding Upper chest: Lobulated soft tissue density in the anterior mediastinum, partially covered and measuring 4.3 cm, appearance not typical for pericardial recess. Review of the MIP images confirms the above findings CTA HEAD FINDINGS Anterior circulation: Vessels are smoothly contoured and widely patent. No notable atheromatous change for age, only mild calcification at the carotid siphons. No branch occlusion, beading, or aneurysm. Posterior circulation:  Fetal type PCA flow with Venous sinuses: As permitted by contrast timing, patent. Anatomic variants: As above Review of the MIP images confirms the above findings IMPRESSION: 1. 4.3 cm partially covered mass or collection in the anterior mediastinum, recommend chest CT with contrast. 2. No emergent vascular finding. Mild for age atherosclerosis without stenosis or plaque irregularity. Electronically Signed   By: Jorje Guild M.D.   On: 03/28/2022 19:50   MR BRAIN WO CONTRAST  Result Date: 03/28/2022 CLINICAL DATA:  Stroke suspected. EXAM: MRI HEAD WITHOUT CONTRAST TECHNIQUE: Multiplanar, multiecho pulse sequences of the brain and surrounding structures were obtained without intravenous contrast. COMPARISON:  Same-day CT head FINDINGS: Brain: There is a small  focus of linear diffusion restriction in the right frontal subcortical white matter consistent with acute infarct. There is no associated hemorrhage or mass effect. There is no other evidence of acute infarct. Parenchymal volume is normal for age. The ventricles are normal in size. Gray-white differentiation is preserved. Patchy and confluence FLAIR signal abnormality in the supratentorial white matter is nonspecific but likely reflects sequela of moderate chronic small-vessel ischemic change. 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 The pituitary and suprasellar region are normal. There is no mass lesion. There is no mass effect or midline shift. Vascular: Normal flow voids. Skull and upper cervical spine: Normal marrow signal. Sinuses/Orbits: The paranasal sinuses are clear. A left lens implant is in place. The globes and orbits are otherwise unremarkable. Other: None. 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 Electronically Signed   By: Valetta Mole M.D.   On: 03/28/2022 17:38   CT HEAD WO CONTRAST  Result Date: 03/28/2022 CLINICAL DATA:  Neurologic deficit. EXAM: CT HEAD WITHOUT CONTRAST TECHNIQUE: Contiguous axial images were obtained from the base of the skull through the vertex without intravenous contrast. RADIATION DOSE REDUCTION: This exam was performed according to the departmental dose-optimization program which includes automated exposure control, adjustment of the mA and/or kV according to patient size and/or use of iterative reconstruction technique. COMPARISON:  None Available. FINDINGS: Brain: There is age-related atrophy and chronic microvascular ischemic changes. There is no acute intracranial hemorrhage. No mass effect or midline shift. No extra-axial fluid collection. Vascular: No hyperdense vessel or unexpected calcification. Skull: Normal. Negative for fracture or focal lesion. Sinuses/Orbits: No acute finding.  Other: None IMPRESSION: 1. No acute intracranial pathology. 2. Age-related atrophy and chronic microvascular ischemic changes. Electronically Signed   By: Anner Crete M.D.   On: 03/28/2022 14:05    Pending Labs Unresulted Labs (From admission, onward)     Start     Ordered   03/30/22 0500  CBC  Tomorrow morning,   R        03/29/22 0843   03/30/22 0500  Comprehensive metabolic panel  Tomorrow morning,   R        03/29/22 0843            Vitals/Pain Today's Vitals   03/29/22  1200 03/29/22 1300 03/29/22 1351 03/29/22 1400  BP: (!) 141/66 (!) 167/93  (!) 161/87  Pulse: 93 73  (!) 101  Resp: '19 16  18  '$ Temp:   98.1 F (36.7 C)   TempSrc:   Oral   SpO2: 93% 91%  94%  PainSc:        Isolation Precautions No active isolations  Medications Medications   stroke: early stages of recovery book (0 each Does not apply Hold 03/29/22 0908)  acetaminophen (TYLENOL) tablet 650 mg (has no administration in time range)    Or  acetaminophen (TYLENOL) 160 MG/5ML solution 650 mg (has no administration in time range)    Or  acetaminophen (TYLENOL) suppository 650 mg (has no administration in time range)  aspirin EC tablet 81 mg (81 mg Oral Given 03/29/22 1224)  0.9 %  sodium chloride infusion ( Intravenous Infusion Verify 03/29/22 1351)  ezetimibe (ZETIA) tablet 10 mg (10 mg Oral Given 03/29/22 1427)  clopidogrel (PLAVIX) tablet 75 mg (75 mg Oral Given 03/29/22 1427)  aspirin chewable tablet 324 mg (324 mg Oral Given 03/28/22 1910)  clopidogrel (PLAVIX) tablet 300 mg (300 mg Oral Given 03/28/22 1910)  iohexol (OMNIPAQUE) 350 MG/ML injection 75 mL (75 mLs Intravenous Contrast Given 03/28/22 1922)  iohexol (OMNIPAQUE) 350 MG/ML injection 75 mL (75 mLs Intravenous Contrast Given 03/28/22 2149)    Mobility walks Low fall risk   Focused Assessments Neuro Assessment Handoff:  Swallow screen pass? Yes  Cardiac Rhythm: Normal sinus rhythm NIH Stroke Scale  Dizziness Present: No Headache Present:  No Interval: Shift assessment Level of Consciousness (1a.)   : Alert, keenly responsive LOC Questions (1b. )   : Answers both questions correctly LOC Commands (1c. )   : Performs both tasks correctly Best Gaze (2. )  : Normal Visual (3. )  : No visual loss Facial Palsy (4. )    : Normal symmetrical movements Motor Arm, Left (5a. )   : No drift Motor Arm, Right (5b. ) : No drift Motor Leg, Left (6a. )  : No drift Motor Leg, Right (6b. ) : No drift Limb Ataxia (7. ): Absent Sensory (8. )  : Normal, no sensory loss Best Language (9. )  : No aphasia Dysarthria (10. ): Normal Extinction/Inattention (11.)   : No Abnormality DO NOT USE: Modified SS Total : 0 Complete NIHSS TOTAL: 0 Last date known well: 03/25/22 Last time known well: 0800 Neuro Assessment: Within Defined Limits Neuro Checks:   Initial (03/28/22 1343)  Has TPA been given? No If patient is a Neuro Trauma and patient is going to OR before floor call report to [place] nurse: [phone] or [phone]   R Recommendations: See Admitting Provider Note  Report given to:   Additional Notes: Treat neuro sx first and then address mediastinal mass

## 2022-03-29 NOTE — Evaluation (Signed)
Clinical/Bedside Swallow Evaluation Patient Details  Name: Faith Massey MRN: [id number] Date of Birth: [birth year]  Today's Date: 03/29/2022 Time: SLP Start Time (ACUTE ONLY): 0930 SLP Stop Time (ACUTE ONLY): 5361 SLP Time Calculation (min) (ACUTE ONLY): 15 min  Past Medical History:  Past Medical History:  Diagnosis Date   Osteoporosis 10/12/2012   Past Surgical History:  Past Surgical History:  Procedure Laterality Date   ABDOMINAL HYSTERECTOMY  2005   BACK SURGERY  2000   MASTECTOMY Bilateral 1995   HPI:  Faith Massey is an 77 y.o. female with a PMHx of osteoporosis, bilateral mastectomy and back surgery who presented to the [facility] ED on Monday afternoon with intermittent slurred speech, subtle right lower quadrant facial droop and right ptosis in conjunction with hoarseness of voice that began on Saturday. No visual acuity symptoms or double vision endorsed by the patient. Also with no limb weakness or ataxia. 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 She was transferred to [facility] for stroke work up. Neurologist note states "Lung imaging summary of results: 4.3 cm partially covered mass or collection in the anterior mediastinum, on CTA of head and neck. CXR reveals fullness in the upper mediastinum and right hilum suspicious for mass lesion given findings on same day CTA head/neck. CTA of chest PE protocol reveals no PE, but a 3 x 4 x 5.3 cm soft tissue mass within the right anterior mediastinum is seen, which could represent thymoma or other thymic mass, lymphoma, or metastatic focus.  - The right sided location of her stroke does not explain her recent symptoms of right facial droop. 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   - She will therefore need both stroke work up as well as further evaluation for her right hilar and mediastinal mass. If the mass is infiltrating the sympathetic  chain on the right as well as her recurrent laryngeal nerve, then her hoarseness as well as her right ptosis would likely be best explained by an infiltrative process."    Assessment / Plan / Recommendation  Clinical Impression  Pt demosntrates no signs of dysphagia or aspiration. She is able to pass 3 oz water swallow, has no appreciable dysphonia at the time of assessment and is able to masticate solids. She endorses mild globus. Recommend pt resume a regular diet and thin liquids. Will plan for FEES tomorrow in am for instrumental assessment of potential laryngeal paresis, though ultimately she would need f/u with ENT to fully address laryngeal function. SLP Visit Diagnosis: Dysphagia, unspecified (R13.10)    Aspiration Risk  Mild aspiration risk    Diet Recommendation Regular;Thin liquid   Liquid Administration via: Cup;Straw Supervision: Patient able to self feed Postural Changes: Seated upright at 90 degrees    Other  Recommendations      Recommendations for follow up therapy are one component of a multi-disciplinary discharge planning process, led by the attending physician.  Recommendations may be updated based on patient status, additional functional criteria and insurance authorization.  Follow up Recommendations        Assistance Recommended at Discharge    Functional Status Assessment    Frequency and Duration            Prognosis        Swallow Study   General HPI: Faith Massey is an 77 y.o. female with a PMHx of osteoporosis, bilateral mastectomy and back surgery who presented  to the [facility] ED on Monday afternoon with intermittent slurred speech, subtle right lower quadrant facial droop and right ptosis in conjunction with hoarseness of voice that began on Saturday. No visual acuity symptoms or double vision endorsed by the patient. Also with no limb weakness or ataxia. 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 She  was transferred to [facility] for stroke work up. Neurologist note states "Lung imaging summary of results: 4.3 cm partially covered mass or collection in the anterior mediastinum, on CTA of head and neck. CXR reveals fullness in the upper mediastinum and right hilum suspicious for mass lesion given findings on same day CTA head/neck. CTA of chest PE protocol reveals no PE, but a 3 x 4 x 5.3 cm soft tissue mass within the right anterior mediastinum is seen, which could represent thymoma or other thymic mass, lymphoma, or metastatic focus.  - The right sided location of her stroke does not explain her recent symptoms of right facial droop. 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   - She will therefore need both stroke work up as well as further evaluation for her right hilar and mediastinal mass. If the mass is infiltrating the sympathetic chain on the right as well as her recurrent laryngeal nerve, then her hoarseness as well as her right ptosis would likely be best explained by an infiltrative process." Previous Swallow Assessment: none Diet Prior to this Study: NPO Temperature Spikes Noted: No Respiratory Status: Room air History of Recent Intubation: No Behavior/Cognition: Alert;Cooperative;Pleasant mood Oral Cavity Assessment: Within Functional Limits Oral Care Completed by SLP: No Oral Cavity - Dentition: Adequate natural dentition Vision: Functional for self-feeding Self-Feeding Abilities: Able to feed self Patient Positioning: Upright in bed Baseline Vocal Quality: Normal Volitional Cough: Strong Volitional Swallow: Able to elicit    Oral/Motor/Sensory Function Overall Oral Motor/Sensory Function: Within functional limits   Ice Chips     Thin Liquid Thin Liquid: Within functional limits    Nectar Thick Nectar Thick Liquid: Not tested   Honey Thick Honey Thick Liquid: Not tested   Puree Puree: Within functional limits    Solid     Solid: Within functional limits      Faith Massey, Faith Massey 03/29/2022,10:49 AM

## 2022-03-29 NOTE — Progress Notes (Signed)
  Echocardiogram 2D Echocardiogram has been performed.  Faith Massey 03/29/2022, 3:52 PM

## 2022-03-29 NOTE — Progress Notes (Signed)
IR received a request to evaluate this patient for a mediastinal mass biopsy. Imaging/chart reviewed by Dr. Anselm Pancoast. IR recommends outpatient work up with PET CT and Oncology evaluation. No procedure planned in IR and the order will be deleted. Epic chat message sent to Dr. Vernell Barrier.   Please contact IR with further questions.  Faith Massey, [place] [phone] 03/29/2022, 9:29 AM

## 2022-03-29 NOTE — Progress Notes (Signed)
Physical Therapy Evaluation and Discharge Patient Details Name: Jameia Makris MRN: [id number] DOB: [birth year] Today's Date: 03/29/2022  History of Present Illness  Laykin Rainone is an 77 y.o. 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 PMHx includes osteoporosis, bilateral mastectomy and back surgery  Clinical Impression  Pt was seen for progression of gait from room to hallway, with good performance including no LOB with higher level challenges in hall and room.  Her ability to walk did not require a walker or cane, and is comfortable with moving off the higher bed and returning to the gurney.  Pt is at home in a fully accessible home, has a dog she walks and is comfortable with all physical requirements with her situation.  Follow up with PCP, no further PT needs are identified.  Signing off for now.       Recommendations for follow up therapy are one component of a multi-disciplinary discharge planning process, led by the attending physician.  Recommendations may be updated based on patient status, additional functional criteria and insurance authorization.  Follow Up Recommendations No PT follow up      Assistance Recommended at Discharge PRN  Patient can return home with the following  Assistance with cooking/housework;Assist for transportation    Equipment Recommendations None recommended by PT  Recommendations for Other Services       Functional Status Assessment Patient has not had a recent decline in their functional status     Precautions / Restrictions Precautions Precautions: Other (comment) (monitor vitals) Restrictions Weight Bearing Restrictions: No      Mobility  Bed Mobility Overal bed mobility: Independent                  Transfers Overall transfer level:  Independent                      Ambulation/Gait Ambulation/Gait assistance: Supervision (only for safety with high level challenges) Gait Distance (Feet): 70 Feet Assistive device: None Gait Pattern/deviations: Step-through pattern, Decreased stride length Gait velocity: reduced but appropriate for space Gait velocity interpretation: <1.31 ft/sec, indicative of household ambulator Pre-gait activities: standing balance ck General Gait Details: pt is walking with no AD and no LOB normally  Stairs            Wheelchair Mobility    Modified Rankin (Stroke Patients Only)       Balance Overall balance assessment: No apparent balance deficits (not formally assessed)                                           Pertinent Vitals/Pain Pain Assessment Pain Assessment: No/denies pain    Home Living Family/patient expects to be discharged to:: Private residence Living Arrangements: Alone (with new dog) Available Help at Discharge: Family;Available PRN/intermittently Type of Home: Other(Comment) Home Access:  (townhome)       Home Layout: One level Home Equipment: None      Prior Function Prior Level of Function : Independent/Modified Independent                     Hand Dominance   Dominant Hand: Right    Extremity/Trunk Assessment   Upper Extremity Assessment Upper Extremity Assessment: Defer to  OT evaluation    Lower Extremity Assessment Lower Extremity Assessment: Overall WFL for tasks assessed    Cervical / Trunk Assessment Cervical / Trunk Assessment: Normal  Communication   Communication: No difficulties  Cognition Arousal/Alertness: Awake/alert Behavior During Therapy: WFL for tasks assessed/performed Overall Cognitive Status: Within Functional Limits for tasks assessed                                 General Comments: a bit rambling about her dog and family but oriented        General Comments  General comments (skin integrity, edema, etc.): Vitals taken which were high for HR and BP but not symptomatic    Exercises     Assessment/Plan    PT Assessment Patient does not need any further PT services  PT Problem List         PT Treatment Interventions      PT Goals (Current goals can be found in the Care Plan section)  Acute Rehab PT Goals Patient Stated Goal: to get home PT Goal Formulation: All assessment and education complete, DC therapy    Frequency       Co-evaluation               AM-PAC PT "6 Clicks" Mobility  Outcome Measure Help needed turning from your back to your side while in a flat bed without using bedrails?: None Help needed moving from lying on your back to sitting on the side of a flat bed without using bedrails?: None Help needed moving to and from a bed to a chair (including a wheelchair)?: None Help needed standing up from a chair using your arms (e.g., wheelchair or bedside chair)?: None Help needed to walk in hospital room?: None Help needed climbing 3-5 steps with a railing? : A Little 6 Click Score: 23    End of Session   Activity Tolerance: Treatment limited secondary to medical complications (Comment) Patient left: in bed;with call bell/phone within reach Nurse Communication: Mobility status;Other (comment) (information about vitals) PT Visit Diagnosis: Other symptoms and signs involving the nervous system (E09.233)    Time: 0076-2263 PT Time Calculation (min) (ACUTE ONLY): 23 min   Charges:   PT Evaluation $PT Eval Moderate Complexity: 1 Mod         Ramond Dial 03/29/2022, 2:15 PM  Mee Hives, PT PhD Acute Rehab Dept. Number: [place]

## 2022-03-29 NOTE — Consult Note (Signed)
NAME:  Faith Massey, MRN:  [id number], DOB:  Jun 01, 1945, LOS: 1 ADMISSION DATE:  03/28/2022, CONSULTATION DATE:  03/29/22 REFERRING MD:  Bonnielee Haff, MD, CHIEF COMPLAINT:  Mediastinal mass   History of Present Illness:  77 year old female with HTN, hx breast cancer in 1990 s/p bilateral mastectomy, osteoporosis who presents with right facial droop and slurred speech. MRI brain with early subacute small ischemic infarct in right motor strip. Transferred to [facility] for stroke work-up. Symptoms began three days PTA. She initially thought this was Bell's palsy so did not seem immediate evaluation. She denies any respiratory symptoms, no cough, shortness of breath or wheezing. Denies unintentional weight loss, unexplained fevers/chills or diaphoresis.  Pertinent  Medical History  As above  Significant Hospital Events: Including procedures, antibiotic start and stop dates in addition to other pertinent events   CTA 03/28/22 Neg for PE. 3 x 4 x 5.3 cm soft tissue mass within right anterior medistinum MR brain 03/28/22 Small acute infarct in the right frontal white matter without hemorrhage or mass effect CTA Head and neck 4.3 mass/collection in the anterior mediastinum  Interim History / Subjective:  As above  Objective   Blood pressure (!) 156/81, pulse 97, temperature 98 F (36.7 C), temperature source Oral, resp. rate (!) 25, SpO2 92 %.       No intake or output data in the 24 hours ending 03/29/22 1133 There were no vitals filed for this visit.   Physical Exam: General: Well-appearing, no acute distress HENT: [place], AT Eyes: EOMI, no scleral icterus Respiratory: Clear to auscultation bilaterally.  No crackles, wheezing or rales Cardiovascular: RRR, -M/R/G, no JVD Extremities:-Edema,-tenderness Neuro: AAO x4, right facial droop, moves extremities x 4 Psych: Normal mood, normal affect   Assessment & Plan:   Anterior mediastinal mass Suspect mass is incidental and unlikely contributing to her  hoarseness. Lesion primarily in the anterior mediastinum with no right hilar involvement. Differential for mass includings thymoma, lymphoma or metastatic lesion. Would warrant further evaluation with PET-CT and biopsy pending on results. Given her acute stroke and need for treatment Plavix, reasonable to obtain imaging during this time and plan for biopsy after treatment period.  --Agree with stroke work-up with Neuro  --Will arrange for hospital follow-up with me with plan to order PET-CT at that time  Labs   CBC: Recent Labs  Lab 03/28/22 1338  WBC 14.0*  NEUTROABS 9.3*  HGB 15.2*  HCT 46.4*  MCV 93.9  PLT 245    Basic Metabolic Panel: Recent Labs  Lab 03/28/22 1338 03/29/22 0512  NA 140  --   K 3.7  --   CL 105  --   CO2 24  --   GLUCOSE 103*  --   BUN 17  --   CREATININE 0.69  --   CALCIUM 9.2  --   MG  --  2.1  PHOS  --  4.0   GFR: CrCl cannot be calculated (Unknown ideal weight.). Recent Labs  Lab 03/28/22 1338  WBC 14.0*    Liver Function Tests: Recent Labs  Lab 03/28/22 1338  AST 40  ALT 56*  ALKPHOS 82  BILITOT 0.7  PROT 6.9  ALBUMIN 4.1   No results for input(s): "LIPASE", "AMYLASE" in the last 168 hours. No results for input(s): "AMMONIA" in the last 168 hours.  ABG No results found for: "PHART", "PCO2ART", "PO2ART", "HCO3", "TCO2", "ACIDBASEDEF", "O2SAT"   Coagulation Profile: Recent Labs  Lab 03/28/22 1338  INR 1.0  Cardiac Enzymes: Recent Labs  Lab 03/29/22 0512  CKTOTAL 164    HbA1C: Hgb A1c MFr Bld  Date/Time Value Ref Range Status  03/28/2022 10:32 PM 5.7 (H) 4.8 - 5.6 % Final    Comment:    (NOTE) Pre diabetes:          5.7%-6.4%  Diabetes:              >6.4%  Glycemic control for   <7.0% adults with diabetes     CBG: Recent Labs  Lab 03/28/22 1350  GLUCAP 87    Review of Systems:   Review of Systems  Constitutional:  Negative for chills, diaphoresis, fever, malaise/fatigue and weight loss.  HENT:   Negative for congestion.   Respiratory:  Negative for cough, hemoptysis, sputum production, shortness of breath and wheezing.   Cardiovascular:  Negative for chest pain, palpitations and leg swelling.  Neurological:  Positive for speech change.     Past Medical History:  She,  has a past medical history of Osteoporosis (10/12/2012).   Surgical History:   Past Surgical History:  Procedure Laterality Date   ABDOMINAL HYSTERECTOMY  2005   BACK SURGERY  2000   MASTECTOMY Bilateral 1995     Social History:   reports that she has never smoked. She has never used smokeless tobacco. She reports current alcohol use. She reports that she does not use drugs.   Family History:  Her family history includes Hypertension in her father and mother; Stroke in her mother.   Allergies No Known Allergies   Home Medications  Prior to Admission medications   Medication Sig Start Date End Date Taking? Authorizing Provider  Cholecalciferol (VITAMIN D-3) 5000 UNITS TABS Take 5,000 Units by mouth daily.   Yes [provider]  Multiple Vitamins-Minerals (MULTIVITAMIN WITH MINERALS) tablet Take 1 tablet by mouth daily.   Yes [provider]  UNABLE TO FIND Take 1 Scoop by mouth daily. Ancient Nutrition Collagen Powder   Yes [provider]     Critical care time: N/A    Care Time: 51 min  Rodman Pickle, M.D. [facility] Pulmonary/Critical Care Medicine 03/29/2022 1:42 PM   Please see Amion for pager number to reach on-call Pulmonary and Critical Care Team.

## 2022-03-29 NOTE — Progress Notes (Signed)
STROKE TEAM PROGRESS NOTE   INTERVAL HISTORY Her RN is at the bedside.  Pt lying in bed, not in distress, still has right ptosis and intermittent hoarseness, but no significant facial droop. No arm or leg weakness. No anisocoria. CTA neck and CT chest confirmed mediastinal mass    Vitals:   03/29/22 0500 03/29/22 0513 03/29/22 0600 03/29/22 0800  BP: 128/69  (!) 158/81 (!) 180/97  Pulse: 86 94 90 95  Resp: (!) '24 17 16 19  '$ Temp:  98 F (36.7 C)    TempSrc:      SpO2: 94% 97% 94% 100%   CBC:  Recent Labs  Lab 03/28/22 1338  WBC 14.0*  NEUTROABS 9.3*  HGB 15.2*  HCT 46.4*  MCV 93.9  PLT 025   Basic Metabolic Panel:  Recent Labs  Lab 03/28/22 1338 03/29/22 0512  NA 140  --   K 3.7  --   CL 105  --   CO2 24  --   GLUCOSE 103*  --   BUN 17  --   CREATININE 0.69  --   CALCIUM 9.2  --   MG  --  2.1  PHOS  --  4.0   Lipid Panel:  Recent Labs  Lab 03/29/22 0512  CHOL 200  TRIG 70  HDL 61  CHOLHDL 3.3  VLDL 14  LDLCALC 125*   HgbA1c:  Recent Labs  Lab 03/28/22 2232  HGBA1C 5.7*   Urine Drug Screen: No results for input(s): "LABOPIA", "COCAINSCRNUR", "LABBENZ", "AMPHETMU", "THCU", "LABBARB" in the last 168 hours.  Alcohol Level  Recent Labs  Lab 03/28/22 1338  ETH <10    IMAGING past 24 hours CT Angio Chest Pulmonary Embolism (PE) W or WO Contrast  Result Date: 03/28/2022 CLINICAL DATA:  Stroke, abnormal CT EXAM: CT ANGIOGRAPHY CHEST WITH CONTRAST TECHNIQUE: Multidetector CT imaging of the chest was performed using the standard protocol during bolus administration of intravenous contrast. Multiplanar CT image reconstructions and MIPs were obtained to evaluate the vascular anatomy. RADIATION DOSE REDUCTION: This exam was performed according to the departmental dose-optimization program which includes automated exposure control, adjustment of the mA and/or kV according to patient size and/or use of iterative reconstruction technique. CONTRAST:  67m OMNIPAQUE  IOHEXOL 350 MG/ML SOLN COMPARISON:  CT 03/28/2022 FINDINGS: Cardiovascular: Satisfactory opacification of the pulmonary arteries to the segmental level. No evidence of pulmonary embolism. Nonaneurysmal aorta. Mild atherosclerosis. Normal cardiac size. No pericardial effusion. Mediastinum/Nodes: Midline trachea. No thyroid mass. Esophagus within normal limits. No suspicious lymph nodes. Right anterior mediastinal soft tissue mass, this measures 3 by 4 by 5.3 cm. Lungs/Pleura: Negative for pneumothorax or pleural effusion. Bandlike atelectasis at the lung bases. Upper Abdomen: No acute finding Musculoskeletal: Bilateral breast implants with mild capsular calcification. No acute osseous abnormality. Review of the MIP images confirms the above findings. IMPRESSION: 1. Negative for acute pulmonary embolus. 2. 3 x 4 x 5.3 cm soft tissue mass within the right anterior mediastinum. Mass could represent thymoma or other thymic mass, lymphoma, or metastatic focus. Aortic Atherosclerosis (ICD10-I70.0). Electronically Signed   By: KDonavan FoilM.D.   On: 03/28/2022 22:17   DG Chest 1 View  Result Date: 03/28/2022 CLINICAL DATA:  Stroke EXAM: CHEST  1 VIEW COMPARISON:  Chest radiograph 05/06/2013 FINDINGS: The heart size is normal. There is fullness is in the upper mediastinum and right hilum suspicious for mass lesion given findings on CTA head/neck from earlier the same day. Linear opacities in the lung bases  likely reflect platelike atelectasis. There is no other focal airspace disease. There is no pulmonary edema. There is no pleural effusion or pneumothorax There is no acute osseous abnormality. IMPRESSION: Fullness in the upper mediastinum and right hilum suspicious for mass lesion given findings on same day CTA head/neck. Recommend CT chest with contrast for further evaluation. Electronically Signed   By: Valetta Mole M.D.   On: 03/28/2022 20:05   CT ANGIO HEAD NECK W WO CM  Result Date: 03/28/2022 CLINICAL DATA:   Intermittent slurred speech and partial right facial droop, symptoms for 3 days. EXAM: CT ANGIOGRAPHY HEAD AND NECK TECHNIQUE: Multidetector CT imaging of the head and neck was performed using the standard protocol during bolus administration of intravenous contrast. Multiplanar CT image reconstructions and MIPs were obtained to evaluate the vascular anatomy. Carotid stenosis measurements (when applicable) are obtained utilizing NASCET criteria, using the distal internal carotid diameter as the denominator. RADIATION DOSE REDUCTION: This exam was performed according to the departmental dose-optimization program which includes automated exposure control, adjustment of the mA and/or kV according to patient size and/or use of iterative reconstruction technique. CONTRAST:  47m OMNIPAQUE IOHEXOL 350 MG/ML SOLN COMPARISON:  Brain MRI from earlier today FINDINGS: CTA NECK FINDINGS Aortic arch: Unremarkable Right carotid system: Vessels are smoothly contoured and widely patent. Partial retropharyngeal course Left carotid system: Vessels are smoothly contoured and widely patent. Partial retropharyngeal course Vertebral arteries: No proximal subclavian or vertebral stenosis or beading. Skeleton: No acute finding.  Generalized cervical spine degeneration Other neck: No acute finding Upper chest: Lobulated soft tissue density in the anterior mediastinum, partially covered and measuring 4.3 cm, appearance not typical for pericardial recess. Review of the MIP images confirms the above findings CTA HEAD FINDINGS Anterior circulation: Vessels are smoothly contoured and widely patent. No notable atheromatous change for age, only mild calcification at the carotid siphons. No branch occlusion, beading, or aneurysm. Posterior circulation:  Fetal type PCA flow with Venous sinuses: As permitted by contrast timing, patent. Anatomic variants: As above Review of the MIP images confirms the above findings IMPRESSION: 1. 4.3 cm partially  covered mass or collection in the anterior mediastinum, recommend chest CT with contrast. 2. No emergent vascular finding. Mild for age atherosclerosis without stenosis or plaque irregularity. Electronically Signed   By: JJorje GuildM.D.   On: 03/28/2022 19:50   MR BRAIN WO CONTRAST  Result Date: 03/28/2022 CLINICAL DATA:  Stroke suspected. EXAM: MRI HEAD WITHOUT CONTRAST TECHNIQUE: Multiplanar, multiecho pulse sequences of the brain and surrounding structures were obtained without intravenous contrast. COMPARISON:  Same-day CT head FINDINGS: Brain: There is a small focus of linear diffusion restriction in the right frontal subcortical white matter consistent with acute infarct. There is no associated hemorrhage or mass effect. There is no other evidence of acute infarct. Parenchymal volume is normal for age. The ventricles are normal in size. Gray-white differentiation is preserved. Patchy and confluence FLAIR signal abnormality in the supratentorial white matter is nonspecific but likely reflects sequela of moderate chronic small-vessel ischemic change. 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 The pituitary and suprasellar region are normal. There is no mass lesion. There is no mass effect or midline shift. Vascular: Normal flow voids. Skull and upper cervical spine: Normal marrow signal. Sinuses/Orbits: The paranasal sinuses are clear. A left lens implant is in place. The globes and orbits are otherwise unremarkable. Other: None. IMPRESSION: Small acute infarct in the right frontal white matter without hemorrhage or mass  effect. Electronically Signed   By: Valetta Mole M.D.   On: 03/28/2022 17:38   CT HEAD WO CONTRAST  Result Date: 03/28/2022 CLINICAL DATA:  Neurologic deficit. EXAM: CT HEAD WITHOUT CONTRAST TECHNIQUE: Contiguous axial images were obtained from the base of the skull through the vertex without intravenous contrast. RADIATION DOSE REDUCTION: This exam was performed  according to the departmental dose-optimization program which includes automated exposure control, adjustment of the mA and/or kV according to patient size and/or use of iterative reconstruction technique. COMPARISON:  None Available. FINDINGS: Brain: There is age-related atrophy and chronic microvascular ischemic changes. There is no acute intracranial hemorrhage. No mass effect or midline shift. No extra-axial fluid collection. Vascular: No hyperdense vessel or unexpected calcification. Skull: Normal. Negative for fracture or focal lesion. Sinuses/Orbits: No acute finding. Other: None IMPRESSION: 1. No acute intracranial pathology. 2. Age-related atrophy and chronic microvascular ischemic changes. Electronically Signed   By: Anner Crete M.D.   On: 03/28/2022 14:05    PHYSICAL EXAM  Constitutional: Appears well-developed and well-nourished.   Cardiovascular: Normal rate and regular rhythm.  Respiratory: Effort normal, non-labored breathing  Neuro - awake, alert, eyes open, orientated to age, place, time and people. No aphasia, fluent language, following all simple commands. Able to name and repeat and read. No gaze palsy, tracking bilaterally, visual field full, PERRL, right eye mild ptosis. No significant facial droop. Tongue midline. Intermittent hoarseness. Bilateral UEs 5/5, no drift. Bilaterally LEs 5/5, no drift. Sensation symmetrical bilaterally, b/l FTN intact, gait not tested.    ASSESSMENT/PLAN Ms. Faith Massey is a 77 y.o. female with history of osteoporosis, bilateral mastectomy and back surgery who initially presented to [facility] ED on Monday afternoon with intermittent slurred speech, subtle right lower quadrant facial droop and right ptosis in conjunction with hoarseness of voice that began on Saturday.   Mediastinal mass CTA neck - 4.3 cm partially covered mass or collection in the anterior mediastinum CTA chest - 3 x 4 x 5.3 cm soft tissue mass within the right anterior mediastinum.  Mass could represent thymoma or other thymic mass, lymphoma, or metastatic focus. 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 Cardioembolic source Code Stroke CT head No acute abnormality.  CTA head & neck No emergent vascular finding but  MRI  Small acute infarct in the right frontal white matter without hemorrhage or mass effect. 2D Echo EF 60 to 65% LE venous Doppler no DVT Recommend Zio patch versus 30-day CardioNet monitoring first.  If negative, and once oncology workup completed, may consider loop recorder as outpatient. LDL 125 HgbA1c 5.7 VTE prophylaxis -SCDs No antithrombotic prior to admission, now on aspirin 81 mg daily and clopidogrel 75 mg daily DAPT for 3 weeks and then aspirin alone. Therapy recommendations: None Disposition: Pending  Hypertension Home meds: None Stable Long-term BP goal normotensive  Hyperlipidemia Home meds: None LDL 125, goal < 70 Add Zetia 10 Patient refused statin at this time Continue Zetia at discharge  Other Stroke Risk Factors Advanced Age >/= 92   Other Active Problems Status post back surgery Status post bilateral mastectomy  Hospital day # 1  Rosalin Hawking, MD PhD Stroke Neurology 03/29/2022 10:32 PM  I discussed with Dr.  [place] Pink. I spent 30 extra inpatient minutes in total face-to-face time with the patient, more than 50% of which was spent in counseling and coordination of care, reviewing test results, images and medication, and discussing the diagnosis, treatment plan and potential prognosis. This patient's care requiresreview of multiple databases, neurological assessment, discussion with family, other specialists  and medical decision making of high complexity.     To contact Stroke Continuity provider, please refer to [email or web address] After hours, contact General Neurology

## 2022-03-30 ENCOUNTER — Other Ambulatory Visit: Payer: Self-pay

## 2022-03-30 ENCOUNTER — Other Ambulatory Visit: Payer: Medicare HMO

## 2022-03-30 ENCOUNTER — Other Ambulatory Visit (HOSPITAL_COMMUNITY): Payer: Self-pay

## 2022-03-30 DIAGNOSIS — J9859 Other diseases of mediastinum, not elsewhere classified: Secondary | ICD-10-CM | POA: Diagnosis not present

## 2022-03-30 DIAGNOSIS — I63311 Cerebral infarction due to thrombosis of right middle cerebral artery: Secondary | ICD-10-CM | POA: Diagnosis not present

## 2022-03-30 DIAGNOSIS — I63321 Cerebral infarction due to thrombosis of right anterior cerebral artery: Secondary | ICD-10-CM

## 2022-03-30 LAB — COMPREHENSIVE METABOLIC PANEL
ALT: 43 U/L (ref 0–44)
AST: 29 U/L (ref 15–41)
Albumin: 3.1 g/dL — ABNORMAL LOW (ref 3.5–5.0)
Alkaline Phosphatase: 63 U/L (ref 38–126)
Anion gap: 8 (ref 5–15)
BUN: 14 mg/dL (ref 8–23)
CO2: 22 mmol/L (ref 22–32)
Calcium: 8.7 mg/dL — ABNORMAL LOW (ref 8.9–10.3)
Chloride: 110 mmol/L (ref 98–111)
Creatinine, Ser: 0.71 mg/dL (ref 0.44–1.00)
GFR, Estimated: >60 mL/min (ref >60)
Glucose, Bld: 102 mg/dL — ABNORMAL HIGH (ref 70–99)
Potassium: 3.7 mmol/L (ref 3.5–5.1)
Sodium: 140 mmol/L (ref 135–145)
Total Bilirubin: 0.9 mg/dL (ref 0.3–1.2)
Total Protein: 5.3 g/dL — ABNORMAL LOW (ref 6.5–8.1)

## 2022-03-30 LAB — CBC
HCT: 40.5 % (ref 36.0–46.0)
Hemoglobin: 13 g/dL (ref 12.0–15.0)
MCH: 30.9 pg (ref 26.0–34.0)
MCHC: 32.1 g/dL (ref 30.0–36.0)
MCV: 96.2 fL (ref 80.0–100.0)
Platelets: 226 10*3/uL (ref 150–400)
RBC: 4.21 MIL/uL (ref 3.87–5.11)
RDW: 15.1 % (ref 11.5–15.5)
WBC: 12 10*3/uL — ABNORMAL HIGH (ref 4.0–10.5)
nRBC: 0 % (ref 0.0–0.2)

## 2022-03-30 MED ORDER — EZETIMIBE 10 MG PO TABS: 10.0000 mg | ORAL_TABLET | Freq: Every day | ORAL | 3 refills | Status: DC

## 2022-03-30 MED ORDER — CLOPIDOGREL BISULFATE 75 MG PO TABS: 75.0000 mg | ORAL_TABLET | Freq: Every day | ORAL | 0 refills | Status: DC

## 2022-03-30 MED ORDER — ASPIRIN 81 MG PO TBEC: 81.0000 mg | DELAYED_RELEASE_TABLET | Freq: Every day | ORAL | 12 refills | Status: DC

## 2022-03-30 MED ORDER — CLOPIDOGREL BISULFATE 75 MG PO TABS
75.0000 mg | ORAL_TABLET | Freq: Every day | ORAL | 0 refills | Status: AC
  Filled 2022-03-30 (×2): qty 21, 21d supply, fill #0

## 2022-03-30 MED ORDER — LISINOPRIL 20 MG PO TABS: 20.0000 mg | ORAL_TABLET | Freq: Every day | ORAL | 1 refills | Status: DC

## 2022-03-30 NOTE — Progress Notes (Signed)
Discharge instructions/teaching/AVS reviewed with pt. All questions answered.

## 2022-03-30 NOTE — Plan of Care (Signed)

## 2022-03-30 NOTE — TOC Transition Note (Signed)
Transition of Care [facility]) - CM/SW Discharge Note   Patient Details  Name: Manjit Bufano MRN: [id number] Date of Birth: 05-Jul-1945  [facility] CM/SW Contact:  Pollie Friar, RN Phone Number: 03/30/2022, 10:55 AM   Clinical Narrative:    Pt is from home alone but states her neighbors will check on her. She did not take prescription medications prior to admission. She drives self as needed.  No PCP but is going to get an appointment with [facility]. CM offered to call but pt prefers to arrange herself.  Pt has transport home.    Final next level of care: Home/Self Care Barriers to Discharge: No Barriers Identified   Patient Goals and CMS Choice      Discharge Placement                         Discharge Plan and Services Additional resources added to the After Visit Summary for                                       Social Determinants of Health (SDOH) Interventions SDOH Screenings   Food Insecurity: No Food Insecurity (03/29/2022)  Housing: Low Risk  (03/29/2022)  Transportation Needs: No Transportation Needs (03/29/2022)  Utilities: Not At Risk (03/29/2022)  Tobacco Use: Low Risk  (03/28/2022)     Readmission Risk Interventions     No data to display

## 2022-03-30 NOTE — Progress Notes (Signed)
STROKE TEAM PROGRESS NOTE   INTERVAL HISTORY Her RN is at the bedside.  Pt is dressed and being discharged. Outpt PET and follow up with pulm. FEES today no significant finding but her hoarseness is intermittent. She still has right ptosis, but less prominent when looking up or with forehead elevation.   Vitals:   03/29/22 2337 03/30/22 0337 03/30/22 0818 03/30/22 1119  BP: (!) 147/84 (!) 157/79 (!) 176/96 (!) 171/84  Pulse: 92 78 84 93  Resp:   18 18  Temp: 98.4 F (36.9 C) 98.6 F (37 C) 98.4 F (36.9 C) 97.9 F (36.6 C)  TempSrc: Oral Oral Oral Oral  SpO2: 96% 96% 98% 95%  Weight:      Height:       CBC:  Recent Labs  Lab 03/28/22 1338 03/30/22 0309  WBC 14.0* 12.0*  NEUTROABS 9.3*  --   HGB 15.2* 13.0  HCT 46.4* 40.5  MCV 93.9 96.2  PLT 283 062   Basic Metabolic Panel:  Recent Labs  Lab 03/28/22 1338 03/29/22 0512 03/30/22 0309  NA 140  --  140  K 3.7  --  3.7  CL 105  --  110  CO2 24  --  22  GLUCOSE 103*  --  102*  BUN 17  --  14  CREATININE 0.69  --  0.71  CALCIUM 9.2  --  8.7*  MG  --  2.1  --   PHOS  --  4.0  --    Lipid Panel:  Recent Labs  Lab 03/29/22 0512  CHOL 200  TRIG 70  HDL 61  CHOLHDL 3.3  VLDL 14  LDLCALC 125*   HgbA1c:  Recent Labs  Lab 03/28/22 2232  HGBA1C 5.7*   Urine Drug Screen: No results for input(s): "LABOPIA", "COCAINSCRNUR", "LABBENZ", "AMPHETMU", "THCU", "LABBARB" in the last 168 hours.  Alcohol Level  Recent Labs  Lab 03/28/22 1338  ETH <10    IMAGING past 24 hours ECHOCARDIOGRAM COMPLETE  Result Date: 03/29/2022    ECHOCARDIOGRAM REPORT   Patient Name:   Faith Massey Date of Exam: 03/29/2022 Medical Rec #:  [id number]  Height:       60.0 in Accession #:    [id number] Weight:       121.0 lb Date of Birth:  [birth year]   BSA:          1.508 m Patient Age:    77 years   BP:           161/87 mmHg Patient Gender: F          HR:           97 bpm. Exam Location:  Inpatient Procedure: 2D Echo, Color Doppler and Cardiac  Doppler Indications:    Stroke  History:        Patient has no prior history of Echocardiogram examinations.                 Risk Factors:Hypertension. Hx of breast cancer.  Sonographer:    Eartha Inch Referring Phys: [id number] ANASTASSIA DOUTOVA  Sonographer Comments: Suboptimal apical window, suboptimal parasternal window and Technically challenging study due to limited acoustic windows. Image acquisition challenging due to patient body habitus, Image acquisition challenging due to breast implants and Image acquisition challenging due to mastectomy. IMPRESSIONS  1. Left ventricular ejection fraction, by estimation, is 60 to 65%. The left ventricle has normal function. The left ventricle has no regional wall motion abnormalities. There is moderate asymmetric  left ventricular hypertrophy of the basal-septal segment. Left ventricular diastolic parameters are consistent with Grade I diastolic dysfunction (impaired relaxation).  2. Right ventricular systolic function is normal. The right ventricular size is normal. Tricuspid regurgitation signal is inadequate for assessing PA pressure.  3. The mitral valve is grossly normal. No evidence of mitral valve regurgitation. No evidence of mitral stenosis.  4. The aortic valve is tricuspid. Aortic valve regurgitation is not visualized. No aortic stenosis is present.  5. The inferior vena cava is normal in size with greater than 50% respiratory variability, suggesting right atrial pressure of 3 mmHg. Conclusion(s)/Recommendation(s): No intracardiac source of embolism detected on this transthoracic study. Consider a transesophageal echocardiogram to exclude cardiac source of embolism if clinically indicated. FINDINGS  Left Ventricle: Left ventricular ejection fraction, by estimation, is 60 to 65%. The left ventricle has normal function. The left ventricle has no regional wall motion abnormalities. The left ventricular internal cavity size was normal in size. There is  moderate  asymmetric left ventricular hypertrophy of the basal-septal segment. Left ventricular diastolic parameters are consistent with Grade I diastolic dysfunction (impaired relaxation). Right Ventricle: The right ventricular size is normal. No increase in right ventricular wall thickness. Right ventricular systolic function is normal. Tricuspid regurgitation signal is inadequate for assessing PA pressure. Left Atrium: Left atrial size was normal in size. Right Atrium: Right atrial size was normal in size. Pericardium: There is no evidence of pericardial effusion. Mitral Valve: The mitral valve is grossly normal. No evidence of mitral valve regurgitation. No evidence of mitral valve stenosis. Tricuspid Valve: The tricuspid valve is grossly normal. Tricuspid valve regurgitation is trivial. No evidence of tricuspid stenosis. Aortic Valve: The aortic valve is tricuspid. Aortic valve regurgitation is not visualized. No aortic stenosis is present. Pulmonic Valve: The pulmonic valve was grossly normal. Pulmonic valve regurgitation is not visualized. No evidence of pulmonic stenosis. Aorta: The aortic root is normal in size and structure. Venous: The inferior vena cava is normal in size with greater than 50% respiratory variability, suggesting right atrial pressure of 3 mmHg. IAS/Shunts: The atrial septum is grossly normal.  LEFT VENTRICLE PLAX 2D LVIDd:         3.10 cm   Diastology LVIDs:         1.90 cm   LV e' medial:   5.44 cm/s LV PW:         0.90 cm   LV E/e' medial: 10.9 LV IVS:        1.40 cm LVOT diam:     1.60 cm LV SV:         45 LV SV Index:   30 LVOT Area:     2.01 cm  RIGHT VENTRICLE RV S prime:     8.04 cm/s LEFT ATRIUM             Index        RIGHT ATRIUM           Index LA diam:        2.50 cm 1.66 cm/m   RA Area:     10.78 cm LA Vol (A2C):   36.8 ml 24.41 ml/m  RA Volume:   23.30 ml  15.45 ml/m LA Vol (A4C):   34.0 ml 22.55 ml/m LA Biplane Vol: 35.6 ml 23.61 ml/m  AORTIC VALVE LVOT Vmax:   108.00 cm/s  LVOT Vmean:  78.600 cm/s LVOT VTI:    0.224 m  AORTA Ao Root diam: 2.80 cm MITRAL VALVE MV  Area (PHT): 3.23 cm    SHUNTS MV Decel Time: 235 msec    Systemic VTI:  0.22 m MV E velocity: 59.10 cm/s  Systemic Diam: 1.60 cm MV A velocity: 73.10 cm/s MV E/A ratio:  0.81 Eleonore Chiquito MD Electronically signed by Eleonore Chiquito MD Signature Date/Time: 03/29/2022/5:12:52 PM    Final    VAS [place] LOWER EXTREMITY VENOUS (DVT)  Result Date: 03/29/2022  Lower Venous DVT Study Patient Name:  Faith Massey  Date of Exam:   03/29/2022 Medical Rec #: [id number]   Accession #:    [id number] Date of Birth: 10-17-1945    Patient Gender: F Patient Age:   40 years Exam Location:  [facility] Procedure:      VAS [place] LOWER EXTREMITY VENOUS (DVT) Referring Phys: Cornelius Moras Christon Parada --------------------------------------------------------------------------------  Indications: Stroke.  Comparison Study: No prior studies. Performing Technologist: Darlin Coco RDMS, RVT  Examination Guidelines: A complete evaluation includes B-mode imaging, spectral Doppler, color Doppler, and power Doppler as needed of all accessible portions of each vessel. Bilateral testing is considered an integral part of a complete examination. Limited examinations for reoccurring indications may be performed as noted. The reflux portion of the exam is performed with the patient in reverse Trendelenburg.  +---------+---------------+---------+-----------+----------+--------------+ RIGHT    CompressibilityPhasicitySpontaneityPropertiesThrombus Aging +---------+---------------+---------+-----------+----------+--------------+ CFV      Full           Yes      Yes                                 +---------+---------------+---------+-----------+----------+--------------+ SFJ      Full                                                        +---------+---------------+---------+-----------+----------+--------------+ FV Prox  Full                                                         +---------+---------------+---------+-----------+----------+--------------+ FV Mid   Full                                                        +---------+---------------+---------+-----------+----------+--------------+ FV DistalFull                                                        +---------+---------------+---------+-----------+----------+--------------+ PFV      Full                                                        +---------+---------------+---------+-----------+----------+--------------+ POP      Full  Yes      Yes                                 +---------+---------------+---------+-----------+----------+--------------+ PTV      Full                                                        +---------+---------------+---------+-----------+----------+--------------+ PERO     Full                                                        +---------+---------------+---------+-----------+----------+--------------+   +---------+---------------+---------+-----------+----------+--------------+ LEFT     CompressibilityPhasicitySpontaneityPropertiesThrombus Aging +---------+---------------+---------+-----------+----------+--------------+ CFV      Full           Yes      Yes                                 +---------+---------------+---------+-----------+----------+--------------+ SFJ      Full                                                        +---------+---------------+---------+-----------+----------+--------------+ FV Prox  Full                                                        +---------+---------------+---------+-----------+----------+--------------+ FV Mid   Full                                                        +---------+---------------+---------+-----------+----------+--------------+ FV DistalFull                                                         +---------+---------------+---------+-----------+----------+--------------+ PFV      Full                                                        +---------+---------------+---------+-----------+----------+--------------+ POP      Full           Yes      Yes                                 +---------+---------------+---------+-----------+----------+--------------+ PTV  Full                                                        +---------+---------------+---------+-----------+----------+--------------+ PERO     Full                                                        +---------+---------------+---------+-----------+----------+--------------+     Summary: RIGHT: - There is no evidence of deep vein thrombosis in the lower extremity.  - No cystic structure found in the popliteal fossa.  LEFT: - There is no evidence of deep vein thrombosis in the lower extremity.  - No cystic structure found in the popliteal fossa.  *See table(s) above for measurements and observations.    Preliminary     PHYSICAL EXAM  Constitutional: Appears well-developed and well-nourished.   Cardiovascular: Normal rate and regular rhythm.  Respiratory: Effort normal, non-labored breathing  Neuro - awake, alert, eyes open, orientated to age, place, time and people. No aphasia, fluent language, following all simple commands. Able to name and repeat and read. No gaze palsy, tracking bilaterally, visual field full, PERRL, right eye mild ptosis but less prominent with forehead elevation or looking upward. No significant facial droop. Tongue midline. Intermittent hoarseness. Bilateral UEs 5/5, no drift. Bilaterally LEs 5/5, no drift. Sensation symmetrical bilaterally, b/l FTN intact, gait not tested.    ASSESSMENT/PLAN Ms. Faith Massey is a 77 y.o. female with history of osteoporosis, bilateral mastectomy and back surgery who initially presented to [facility] ED on Monday afternoon with intermittent slurred speech,  subtle right lower quadrant facial droop and right ptosis in conjunction with hoarseness of voice that began on Saturday.   Mediastinal mass CTA neck - 4.3 cm partially covered mass or collection in the anterior mediastinum CTA chest - 3 x 4 x 5.3 cm soft tissue mass within the right anterior mediastinum. Mass could represent thymoma or other thymic mass, lymphoma, or metastatic focus. 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 Cardioembolic source Code Stroke CT head No acute abnormality.  CTA head & neck No emergent vascular finding but  MRI  Small acute infarct in the right frontal white matter without hemorrhage or mass effect. 2D Echo EF 60 to 65% LE venous Doppler no DVT Pt will follow up with Dr. Einar Gip for Cardiac monitoring first.  If negative, and once oncology workup completed, may consider loop recorder as outpatient. LDL 125 HgbA1c 5.7 VTE prophylaxis -SCDs No antithrombotic prior to admission, now on aspirin 81 mg daily and clopidogrel 75 mg daily DAPT for 3 weeks and then aspirin alone. Therapy recommendations: None Disposition: Pending  Hypertension Home meds: None Stable Long-term BP goal normotensive  Hyperlipidemia Home meds: None LDL 125, goal < 70 Add Zetia 10 Patient refused statin at  this time Continue Zetia at discharge  Other Stroke Risk Factors Advanced Age >/= 57   Other Active Problems Status post back surgery Status post bilateral mastectomy  Hospital day # 2  Neurology will sign off. Please call with questions. Pt will  follow up with stroke clinic Dr. Leonie Man at [facility] in about 4 weeks. Thanks for the consult.   Rosalin Hawking, MD PhD Stroke Neurology 03/30/2022 3:23 PM      To contact Stroke Continuity provider, please refer to [email or web address] After hours, contact General Neurology

## 2022-03-30 NOTE — Discharge Summary (Signed)
[facility]  Physician Discharge Summary   Patient ID: Faith Massey MRN: [id number] DOB/AGE: 75-Mar-1947 75 y.o.  Admit date: 03/28/2022 Discharge date:   03/30/2022   PCP: Patient, No Pcp Per  DISCHARGE DIAGNOSES:    CVA (cerebral vascular accident) [facility]) Essential hypertension   Mediastinal mass   RECOMMENDATIONS FOR OUTPATIENT FOLLOW UP: Patient to pick up heart monitor from cardiology office this afternoon Patient follow-up with pulmonology.  Appointment has been scheduled. Ambulatory referral sent to neurology    Home Health: None Equipment/Devices: None  CODE STATUS: Full code  DISCHARGE CONDITION: fair  Diet recommendation: Heart healthy  INITIAL HISTORY: 77 y.o. female with medical history significant of HTN, breast cancer sp double mastectomy 1990 who presented with right-sided facial droop and difficulty finding words.  There was also history of intermittent slurred speech.  Patient presented to the emergency department.  Found to have acute stroke.  Hospitalized for further management.  Also found to have a mediastinal mass.    HOSPITAL COURSE:   Acute versus subacute stroke MRI brain revealed subacute infarct in the right hemisphere.  Patient was seen by neurology. Aspirin and Plavix for 3 weeks followed by aspirin alone LDL 125.  Patient was offered statin which she refused.  After further discussion she is agreeable to take Zetia.  HbA1c 5.7. Echocardiogram shows normal systolic function.   Seen by physical and Occupational Therapy and speech therapy.  She will undergo FEES today prior to discharge.  No other needs identified.   Mediastinal mass Previous history of breast cancer noted.  Concern for Pancoast tumor secondary to neurological symptoms. Due to need for aspirin and Plavix for 3 weeks no biopsy planned at this time.  Pulmonology to follow the patient in their office and arrange for an outpatient PET/CT. She does have intermittent voice  hoarseness.  May need referral to ENT as well.   Essential hypertension Permissive hypertension was allowed.  She was not antihypertensives prior to admission.  She will be discharged on lisinopril considering LVH noted on echocardiogram.  Patient is stable.  Okay for discharge home today after FEES.   PERTINENT LABS:  The results of significant diagnostics from this hospitalization (including imaging, microbiology, ancillary and laboratory) are listed below for reference.    Labs:   Basic Metabolic Panel: Recent Labs  Lab 03/28/22 1338 03/29/22 0512 03/30/22 0309  NA 140  --  140  K 3.7  --  3.7  CL 105  --  110  CO2 24  --  22  GLUCOSE 103*  --  102*  BUN 17  --  14  CREATININE 0.69  --  0.71  CALCIUM 9.2  --  8.7*  MG  --  2.1  --   PHOS  --  4.0  --    Liver Function Tests: Recent Labs  Lab 03/28/22 1338 03/30/22 0309  AST 40 29  ALT 56* 43  ALKPHOS 82 63  BILITOT 0.7 0.9  PROT 6.9 5.3*  ALBUMIN 4.1 3.1*    CBC: Recent Labs  Lab 03/28/22 1338 03/30/22 0309  WBC 14.0* 12.0*  NEUTROABS 9.3*  --   HGB 15.2* 13.0  HCT 46.4* 40.5  MCV 93.9 96.2  PLT 283 226   Cardiac Enzymes: Recent Labs  Lab 03/29/22 0512  CKTOTAL 164     CBG: Recent Labs  Lab 03/28/22 1350  GLUCAP 87     IMAGING STUDIES ECHOCARDIOGRAM COMPLETE  Result Date: 03/29/2022    ECHOCARDIOGRAM REPORT  Patient Name:   Faith Massey Date of Exam: 03/29/2022 Medical Rec #:  [id number]  Height:       60.0 in Accession #:    [id number] Weight:       121.0 lb Date of Birth:  03-01-1946   BSA:          1.508 m Patient Age:    7 years   BP:           161/87 mmHg Patient Gender: F          HR:           97 bpm. Exam Location:  Inpatient Procedure: 2D Echo, Color Doppler and Cardiac Doppler Indications:    Stroke  History:        Patient has no prior history of Echocardiogram examinations.                 Risk Factors:Hypertension. Hx of breast cancer.  Sonographer:    Eartha Inch Referring  Phys: [id number] ANASTASSIA DOUTOVA  Sonographer Comments: Suboptimal apical window, suboptimal parasternal window and Technically challenging study due to limited acoustic windows. Image acquisition challenging due to patient body habitus, Image acquisition challenging due to breast implants and Image acquisition challenging due to mastectomy. IMPRESSIONS  1. Left ventricular ejection fraction, by estimation, is 60 to 65%. The left ventricle has normal function. The left ventricle has no regional wall motion abnormalities. There is moderate asymmetric left ventricular hypertrophy of the basal-septal segment. Left ventricular diastolic parameters are consistent with Grade I diastolic dysfunction (impaired relaxation).  2. Right ventricular systolic function is normal. The right ventricular size is normal. Tricuspid regurgitation signal is inadequate for assessing PA pressure.  3. The mitral valve is grossly normal. No evidence of mitral valve regurgitation. No evidence of mitral stenosis.  4. The aortic valve is tricuspid. Aortic valve regurgitation is not visualized. No aortic stenosis is present.  5. The inferior vena cava is normal in size with greater than 50% respiratory variability, suggesting right atrial pressure of 3 mmHg. Conclusion(s)/Recommendation(s): No intracardiac source of embolism detected on this transthoracic study. Consider a transesophageal echocardiogram to exclude cardiac source of embolism if clinically indicated. FINDINGS  Left Ventricle: Left ventricular ejection fraction, by estimation, is 60 to 65%. The left ventricle has normal function. The left ventricle has no regional wall motion abnormalities. The left ventricular internal cavity size was normal in size. There is  moderate asymmetric left ventricular hypertrophy of the basal-septal segment. Left ventricular diastolic parameters are consistent with Grade I diastolic dysfunction (impaired relaxation). Right Ventricle: The right ventricular  size is normal. No increase in right ventricular wall thickness. Right ventricular systolic function is normal. Tricuspid regurgitation signal is inadequate for assessing PA pressure. Left Atrium: Left atrial size was normal in size. Right Atrium: Right atrial size was normal in size. Pericardium: There is no evidence of pericardial effusion. Mitral Valve: The mitral valve is grossly normal. No evidence of mitral valve regurgitation. No evidence of mitral valve stenosis. Tricuspid Valve: The tricuspid valve is grossly normal. Tricuspid valve regurgitation is trivial. No evidence of tricuspid stenosis. Aortic Valve: The aortic valve is tricuspid. Aortic valve regurgitation is not visualized. No aortic stenosis is present. Pulmonic Valve: The pulmonic valve was grossly normal. Pulmonic valve regurgitation is not visualized. No evidence of pulmonic stenosis. Aorta: The aortic root is normal in size and structure. Venous: The inferior vena cava is normal in size with greater than 50% respiratory variability, suggesting right atrial pressure of  3 mmHg. IAS/Shunts: The atrial septum is grossly normal.  LEFT VENTRICLE PLAX 2D LVIDd:         3.10 cm   Diastology LVIDs:         1.90 cm   LV e' medial:   5.44 cm/s LV PW:         0.90 cm   LV E/e' medial: 10.9 LV IVS:        1.40 cm LVOT diam:     1.60 cm LV SV:         45 LV SV Index:   30 LVOT Area:     2.01 cm  RIGHT VENTRICLE RV S prime:     8.04 cm/s LEFT ATRIUM             Index        RIGHT ATRIUM           Index LA diam:        2.50 cm 1.66 cm/m   RA Area:     10.78 cm LA Vol (A2C):   36.8 ml 24.41 ml/m  RA Volume:   23.30 ml  15.45 ml/m LA Vol (A4C):   34.0 ml 22.55 ml/m LA Biplane Vol: 35.6 ml 23.61 ml/m  AORTIC VALVE LVOT Vmax:   108.00 cm/s LVOT Vmean:  78.600 cm/s LVOT VTI:    0.224 m  AORTA Ao Root diam: 2.80 cm MITRAL VALVE MV Area (PHT): 3.23 cm    SHUNTS MV Decel Time: 235 msec    Systemic VTI:  0.22 m MV E velocity: 59.10 cm/s  Systemic Diam: 1.60 cm  MV A velocity: 73.10 cm/s MV E/A ratio:  0.81 Eleonore Chiquito MD Electronically signed by Eleonore Chiquito MD Signature Date/Time: 03/29/2022/5:12:52 PM    Final    VAS [place] LOWER EXTREMITY VENOUS (DVT)  Result Date: 03/29/2022  Lower Venous DVT Study Patient Name:  VAUDIE ENGEBRETSEN  Date of Exam:   03/29/2022 Medical Rec #: [id number]   Accession #:    [id number] Date of Birth: [birth year]    Patient Gender: F Patient Age:   9 years Exam Location:  [facility] Procedure:      VAS [place] LOWER EXTREMITY VENOUS (DVT) Referring Phys: Cornelius Moras XU --------------------------------------------------------------------------------  Indications: Stroke.  Comparison Study: No prior studies. Performing Technologist: Darlin Coco RDMS, RVT  Examination Guidelines: A complete evaluation includes B-mode imaging, spectral Doppler, color Doppler, and power Doppler as needed of all accessible portions of each vessel. Bilateral testing is considered an integral part of a complete examination. Limited examinations for reoccurring indications may be performed as noted. The reflux portion of the exam is performed with the patient in reverse Trendelenburg.  +---------+---------------+---------+-----------+----------+--------------+ RIGHT    CompressibilityPhasicitySpontaneityPropertiesThrombus Aging +---------+---------------+---------+-----------+----------+--------------+ CFV      Full           Yes      Yes                                 +---------+---------------+---------+-----------+----------+--------------+ SFJ      Full                                                        +---------+---------------+---------+-----------+----------+--------------+ FV Prox  Full                                                        +---------+---------------+---------+-----------+----------+--------------+  FV Mid   Full                                                         +---------+---------------+---------+-----------+----------+--------------+ FV DistalFull                                                        +---------+---------------+---------+-----------+----------+--------------+ PFV      Full                                                        +---------+---------------+---------+-----------+----------+--------------+ POP      Full           Yes      Yes                                 +---------+---------------+---------+-----------+----------+--------------+ PTV      Full                                                        +---------+---------------+---------+-----------+----------+--------------+ PERO     Full                                                        +---------+---------------+---------+-----------+----------+--------------+   +---------+---------------+---------+-----------+----------+--------------+ LEFT     CompressibilityPhasicitySpontaneityPropertiesThrombus Aging +---------+---------------+---------+-----------+----------+--------------+ CFV      Full           Yes      Yes                                 +---------+---------------+---------+-----------+----------+--------------+ SFJ      Full                                                        +---------+---------------+---------+-----------+----------+--------------+ FV Prox  Full                                                        +---------+---------------+---------+-----------+----------+--------------+ FV Mid   Full                                                        +---------+---------------+---------+-----------+----------+--------------+  FV DistalFull                                                        +---------+---------------+---------+-----------+----------+--------------+ PFV      Full                                                         +---------+---------------+---------+-----------+----------+--------------+ POP      Full           Yes      Yes                                 +---------+---------------+---------+-----------+----------+--------------+ PTV      Full                                                        +---------+---------------+---------+-----------+----------+--------------+ PERO     Full                                                        +---------+---------------+---------+-----------+----------+--------------+     Summary: RIGHT: - There is no evidence of deep vein thrombosis in the lower extremity.  - No cystic structure found in the popliteal fossa.  LEFT: - There is no evidence of deep vein thrombosis in the lower extremity.  - No cystic structure found in the popliteal fossa.  *See table(s) above for measurements and observations.    Preliminary    CT Angio Chest Pulmonary Embolism (PE) W or WO Contrast  Result Date: 03/28/2022 CLINICAL DATA:  Stroke, abnormal CT EXAM: CT ANGIOGRAPHY CHEST WITH CONTRAST TECHNIQUE: Multidetector CT imaging of the chest was performed using the standard protocol during bolus administration of intravenous contrast. Multiplanar CT image reconstructions and MIPs were obtained to evaluate the vascular anatomy. RADIATION DOSE REDUCTION: This exam was performed according to the departmental dose-optimization program which includes automated exposure control, adjustment of the mA and/or kV according to patient size and/or use of iterative reconstruction technique. CONTRAST:  43m OMNIPAQUE IOHEXOL 350 MG/ML SOLN COMPARISON:  CT 03/28/2022 FINDINGS: Cardiovascular: Satisfactory opacification of the pulmonary arteries to the segmental level. No evidence of pulmonary embolism. Nonaneurysmal aorta. Mild atherosclerosis. Normal cardiac size. No pericardial effusion. Mediastinum/Nodes: Midline trachea. No thyroid mass. Esophagus within normal limits. No suspicious lymph  nodes. Right anterior mediastinal soft tissue mass, this measures 3 by 4 by 5.3 cm. Lungs/Pleura: Negative for pneumothorax or pleural effusion. Bandlike atelectasis at the lung bases. Upper Abdomen: No acute finding Musculoskeletal: Bilateral breast implants with mild capsular calcification. No acute osseous abnormality. Review of the MIP images confirms the above findings. IMPRESSION: 1. Negative for acute pulmonary embolus. 2. 3 x 4 x 5.3 cm soft tissue mass within the right anterior mediastinum. Mass could represent thymoma or other  thymic mass, lymphoma, or metastatic focus. Aortic Atherosclerosis (ICD10-I70.0). Electronically Signed   By: Donavan Foil M.D.   On: 03/28/2022 22:17   DG Chest 1 View  Result Date: 03/28/2022 CLINICAL DATA:  Stroke EXAM: CHEST  1 VIEW COMPARISON:  Chest radiograph 05/06/2013 FINDINGS: The heart size is normal. There is fullness is in the upper mediastinum and right hilum suspicious for mass lesion given findings on CTA head/neck from earlier the same day. Linear opacities in the lung bases likely reflect platelike atelectasis. There is no other focal airspace disease. There is no pulmonary edema. There is no pleural effusion or pneumothorax There is no acute osseous abnormality. IMPRESSION: Fullness in the upper mediastinum and right hilum suspicious for mass lesion given findings on same day CTA head/neck. Recommend CT chest with contrast for further evaluation. Electronically Signed   By: Valetta Mole M.D.   On: 03/28/2022 20:05   CT ANGIO HEAD NECK W WO CM  Result Date: 03/28/2022 CLINICAL DATA:  Intermittent slurred speech and partial right facial droop, symptoms for 3 days. EXAM: CT ANGIOGRAPHY HEAD AND NECK TECHNIQUE: Multidetector CT imaging of the head and neck was performed using the standard protocol during bolus administration of intravenous contrast. Multiplanar CT image reconstructions and MIPs were obtained to evaluate the vascular anatomy. Carotid stenosis  measurements (when applicable) are obtained utilizing NASCET criteria, using the distal internal carotid diameter as the denominator. RADIATION DOSE REDUCTION: This exam was performed according to the departmental dose-optimization program which includes automated exposure control, adjustment of the mA and/or kV according to patient size and/or use of iterative reconstruction technique. CONTRAST:  56m OMNIPAQUE IOHEXOL 350 MG/ML SOLN COMPARISON:  Brain MRI from earlier today FINDINGS: CTA NECK FINDINGS Aortic arch: Unremarkable Right carotid system: Vessels are smoothly contoured and widely patent. Partial retropharyngeal course Left carotid system: Vessels are smoothly contoured and widely patent. Partial retropharyngeal course Vertebral arteries: No proximal subclavian or vertebral stenosis or beading. Skeleton: No acute finding.  Generalized cervical spine degeneration Other neck: No acute finding Upper chest: Lobulated soft tissue density in the anterior mediastinum, partially covered and measuring 4.3 cm, appearance not typical for pericardial recess. Review of the MIP images confirms the above findings CTA HEAD FINDINGS Anterior circulation: Vessels are smoothly contoured and widely patent. No notable atheromatous change for age, only mild calcification at the carotid siphons. No branch occlusion, beading, or aneurysm. Posterior circulation:  Fetal type PCA flow with Venous sinuses: As permitted by contrast timing, patent. Anatomic variants: As above Review of the MIP images confirms the above findings IMPRESSION: 1. 4.3 cm partially covered mass or collection in the anterior mediastinum, recommend chest CT with contrast. 2. No emergent vascular finding. Mild for age atherosclerosis without stenosis or plaque irregularity. Electronically Signed   By: JJorje GuildM.D.   On: 03/28/2022 19:50   MR BRAIN WO CONTRAST  Result Date: 03/28/2022 CLINICAL DATA:  Stroke suspected. EXAM: MRI HEAD WITHOUT CONTRAST  TECHNIQUE: Multiplanar, multiecho pulse sequences of the brain and surrounding structures were obtained without intravenous contrast. COMPARISON:  Same-day CT head FINDINGS: Brain: There is a small focus of linear diffusion restriction in the right frontal subcortical white matter consistent with acute infarct. There is no associated hemorrhage or mass effect. There is no other evidence of acute infarct. Parenchymal volume is normal for age. The ventricles are normal in size. Gray-white differentiation is preserved. Patchy and confluence FLAIR signal abnormality in the supratentorial white matter is nonspecific but likely reflects sequela of  moderate chronic small-vessel ischemic change. 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 The pituitary and suprasellar region are normal. There is no mass lesion. There is no mass effect or midline shift. Vascular: Normal flow voids. Skull and upper cervical spine: Normal marrow signal. Sinuses/Orbits: The paranasal sinuses are clear. A left lens implant is in place. The globes and orbits are otherwise unremarkable. Other: None. 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 Electronically Signed   By: Valetta Mole M.D.   On: 03/28/2022 17:38   CT HEAD WO CONTRAST  Result Date: 03/28/2022 CLINICAL DATA:  Neurologic deficit. EXAM: CT HEAD WITHOUT CONTRAST TECHNIQUE: Contiguous axial images were obtained from the base of the skull through the vertex without intravenous contrast. RADIATION DOSE REDUCTION: This exam was performed according to the departmental dose-optimization program which includes automated exposure control, adjustment of the mA and/or kV according to patient size and/or use of iterative reconstruction technique. COMPARISON:  None Available. FINDINGS: Brain: There is age-related atrophy and chronic microvascular ischemic changes. There is no acute intracranial hemorrhage. No mass effect  or midline shift. No extra-axial fluid collection. Vascular: No hyperdense vessel or unexpected calcification. Skull: Normal. Negative for fracture or focal lesion. Sinuses/Orbits: No acute finding. Other: None IMPRESSION: 1. No acute intracranial pathology. 2. Age-related atrophy and chronic microvascular ischemic changes. Electronically Signed   By: Anner Crete M.D.   On: 03/28/2022 14:05    DISCHARGE EXAMINATION: Vitals:   03/29/22 2337 03/30/22 0337 03/30/22 0818 03/30/22 1119  BP: (!) 147/84 (!) 157/79 (!) 176/96 (!) 171/84  Pulse: 92 78 84 93  Resp:   18 18  Temp: 98.4 F (36.9 C) 98.6 F (37 C) 98.4 F (36.9 C) 97.9 F (36.6 C)  TempSrc: Oral Oral Oral Oral  SpO2: 96% 96% 98% 95%  Weight:      Height:       General appearance: Awake alert.  In no distress Resp: Clear to auscultation bilaterally.  Normal effort Cardio: S1-S2 is normal regular.  No S3-S4.  No rubs murmurs or bruit GI: Abdomen is soft.  Nontender nondistended.  Bowel sounds are present normal.  No masses organomegaly   DISPOSITION: Home  Discharge Instructions     Ambulatory referral to Neurology   Complete by: As directed    An appointment is requested in approximately: 8 weeks   Call MD for:  difficulty breathing, headache or visual disturbances   Complete by: As directed    Call MD for:  extreme fatigue   Complete by: As directed    Call MD for:  persistant dizziness or light-headedness   Complete by: As directed    Call MD for:  persistant nausea and vomiting   Complete by: As directed    Call MD for:  severe uncontrolled pain   Complete by: As directed    Call MD for:  temperature >100.4   Complete by: As directed    Diet - low sodium heart healthy   Complete by: As directed    Discharge instructions   Complete by: As directed    Please go by the cardiologist office as mentioned on your discharge paperwork to pick up the heart monitor/patch.  Appointment has been made for you to see Dr.  Loanne Drilling for further evaluation of the mass in your chest. Ambulatory referral to be sent to neurology as well. Please be sure to follow-up with your primary care provider within 1  week.  You were cared for by a hospitalist during your hospital stay. If you have any questions about your discharge medications or the care you received while you were in the hospital after you are discharged, you can call the unit and asked to speak with the hospitalist on call if the hospitalist that took care of you is not available. Once you are discharged, your primary care physician will handle any further medical issues. Please note that NO REFILLS for any discharge medications will be authorized once you are discharged, as it is imperative that you return to your primary care physician (or establish a relationship with a primary care physician if you do not have one) for your aftercare needs so that they can reassess your need for medications and monitor your lab values. If you do not have a primary care physician, you can call [phone] for a physician referral.   Increase activity slowly   Complete by: As directed           Allergies as of 03/30/2022   No Known Allergies      Medication List     STOP taking these medications    UNABLE TO FIND       TAKE these medications    aspirin EC 81 MG tablet Take 1 tablet (81 mg total) by mouth daily. Swallow whole.   clopidogrel 75 MG tablet Commonly known as: PLAVIX Take 1 tablet (75 mg total) by mouth daily for 21 days.   ezetimibe 10 MG tablet Commonly known as: ZETIA Take 1 tablet (10 mg total) by mouth daily.   lisinopril 20 MG tablet Commonly known as: ZESTRIL Take 1 tablet (20 mg total) by mouth daily.   multivitamin with minerals tablet Take 1 tablet by mouth daily.   Vitamin D-3 125 MCG (5000 UT) Tabs Take 5,000 Units by mouth daily.          Follow-up Information     Adrian Prows, MD Follow up.   Specialty: Cardiology Why:  Please call [phone] to arrange to pick up the heart monitor/patch. Please go by the office this afternoon to pick up the monitor/patch Contact information: [place] [address] [phone]         Margaretha Seeds, MD Follow up on 04/11/2022.   Specialty: Pulmonary Disease Why: on 04/11/22 at 8:45AM. Contact information: [address] [place] [address] [phone]                 TOTAL DISCHARGE TIME: 35 minutes  [facility] Pager on [email or web address]  03/30/2022, 11:57 AM

## 2022-03-30 NOTE — Procedures (Addendum)
Objective Swallowing Evaluation: Type of Study: FEES-Fiberoptic Endoscopic Evaluation of Swallow   Patient Details  Name: Faith Massey MRN: [id number] Date of Birth: 1946/01/19  Today's Date: 03/30/2022 Time: SLP Start Time (ACUTE ONLY): 1130 -SLP Stop Time (ACUTE ONLY): 1230  SLP Time Calculation (min) (ACUTE ONLY): 60 min   Past Medical History:  Past Medical History:  Diagnosis Date   Osteoporosis 10/12/2012   Past Surgical History:  Past Surgical History:  Procedure Laterality Date   ABDOMINAL HYSTERECTOMY  2005   BACK SURGERY  2000   MASTECTOMY Bilateral 1995   HPI: Faith Massey is an 77 y.o. female with a PMHx of osteoporosis, bilateral mastectomy and back surgery who presented to the [facility] ED on Monday afternoon with intermittent slurred speech, subtle right lower quadrant facial droop and right ptosis in conjunction with hoarseness of voice that began on Saturday. No visual acuity symptoms or double vision endorsed by the patient. Also with no limb weakness or ataxia. 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 She was transferred to [facility] for stroke work up. Neurologist note states "Lung imaging summary of results: 4.3 cm partially covered mass or collection in the anterior mediastinum, on CTA of head and neck. CXR reveals fullness in the upper mediastinum and right hilum suspicious for mass lesion given findings on same day CTA head/neck. CTA of chest PE protocol reveals no PE, but a 3 x 4 x 5.3 cm soft tissue mass within the right anterior mediastinum is seen, which could represent thymoma or other thymic mass, lymphoma, or metastatic focus.  - The right sided location of her stroke does not explain her recent symptoms of right facial droop. 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   - She will therefore need both stroke work up as well as further evaluation for her right hilar  and mediastinal mass. If the mass is infiltrating the sympathetic chain on the right as well as her recurrent laryngeal nerve, then her hoarseness as well as her right ptosis would likely be best explained by an infiltrative process."   No data recorded   Recommendations for follow up therapy are one component of a multi-disciplinary discharge planning process, led by the attending physician.  Recommendations may be updated based on patient status, additional functional criteria and insurance authorization.  Assessment / Plan / Recommendation     03/30/2022   12:00 PM  Clinical Impressions  Clinical Impression Pt demonstrates no observable weakness or asymmetry of laryngeal structures. Abduction and adduction of the arytenoids appears WNL. Tissue is healthy without signs of edema or erythema that would indicate reflux. Pharyngeal wall movement is symmetrical. Pt could potentially have changes to strength and endurance that can impact quality of phonation that are not apparent on FEES, such as slightly decreased laryngeal elevation with fatigue. VIdeostroboscopy is needed with voice analysis by a voice specialist to truly diagnose dysphonia. Swallowing with FEES is WFL, though pt was noted to have swallow initiation at the pyriform sinuses with liquids. Pt was able to verbalize sensation of this.  Airway protection is consistent. There is no residue. Laryngeal and pharyngeal sensation appears in tact.  She also reported recent globus. Denies reflux symptoms. We talked about simple vocal hygiene and strategies for reflux. Would advise pt to f/u with voice specialist if dyphonia persists/worsens.  SLP Visit Diagnosis Other (comment)  Impact on safety and function Mild aspiration risk  03/30/2022   12:00 PM  Treatment Recommendations  Treatment Recommendations Defer treatment plan to f/u with SLP         No data to display             03/30/2022   12:00 PM  Diet Recommendations  SLP  Diet Recommendations Regular solids;Thin liquid  Liquid Administration via Cup;Straw  Medication Administration Whole meds with liquid  Compensations Slow rate;Small sips/bites;Follow solids with liquid  Postural Changes Remain semi-upright after after feeds/meals (Comment);Seated upright at 90 degrees          No data to display              No data to display               03/30/2022   12:00 PM  Oral Phase  Oral Phase [facility]       03/30/2022   12:00 PM  Pharyngeal Phase  Pharyngeal Phase Impaired  Pharyngeal- Thin Cup Delayed swallow initiation-pyriform sinuses;NT  Pharyngeal- Thin Straw Delayed swallow initiation-pyriform sinuses  Pharyngeal- Regular Delayed swallow initiation-vallecula         No data to display           Jevon Shells, [name] 03/30/2022, 1:57 PM

## 2022-03-31 ENCOUNTER — Ambulatory Visit: Payer: Medicare HMO | Admitting: Neurology

## 2022-03-31 ENCOUNTER — Telehealth: Payer: Self-pay | Admitting: Neurology

## 2022-03-31 VITALS — BP 168/94 | HR 96 | Ht <= 58 in | Wt 109.5 lb

## 2022-03-31 DIAGNOSIS — J9859 Other diseases of mediastinum, not elsewhere classified: Secondary | ICD-10-CM

## 2022-03-31 DIAGNOSIS — I639 Cerebral infarction, unspecified: Secondary | ICD-10-CM

## 2022-03-31 DIAGNOSIS — G709 Myoneural disorder, unspecified: Secondary | ICD-10-CM | POA: Diagnosis not present

## 2022-03-31 DIAGNOSIS — E782 Mixed hyperlipidemia: Secondary | ICD-10-CM

## 2022-03-31 NOTE — Progress Notes (Signed)
[facility] [address] [place]. [place] [address] [phone]       OFFICE CONSULT NOTE  Ms. Roshana Shuffield Date of Birth:  1945/10/19 Medical Record Number:  [id number]   Referring MD: Bonnielee Haff  Reason for Referral: Stroke  HPI: Ms. Popescu is a pleasant 77 year old Caucasian lady seen today for initial office consultation visit for stroke.  History is obtained from the patient, daughter and son and I have personally reviewed pertinent available  imaging films in PACS.  She has past medical history of osteoporosis, back surgery and remote breast cancer.  She presented on 03/28/2022 with intermittent slurred speech and subtle right lower quadrant facial droop as well as right ptosis with hoarseness of voice that began a few days prior to admission.  She denies any accompanying headache, double vision, limb weakness.  She did have some fatigability and tiredness.  An MRI scan of the brain was obtained which showed early subacute small infarct in the right frontal white matter which was felt to be clinically silent.  CT angiogram of the brain and neck showed no significant large vessel stenosis or occlusion but showed incidental 3 x 4 x 5.3 cm anterior mediastinal mass with the differential stated as being thymoma versus metastasis or lymphoma.  Patient denies any significant weight loss decreased appetite.  She was seen by pulmonologist and plans for her to do outpatient PET scan to look for malignancy and possible biopsy later.  Patient denies any prior known history of strokes or TIAs or atrial fibrillation.  She denies any left-sided weakness or numbness.  She was not on antiplatelets prior to admission and was started on aspirin and Plavix for 3 weeks followed by aspirin alone.  LDL cholesterol was 125 mg percent.  Hemoglobin A1c of 5.7.  He was started on Zetia 10 mg not on statin due to concerns for muscle weakness.  She has an appointment to see Dr. Loanne Drilling from pulmonology on  04/11/2022.  Patient states since discharge from the hospital she continues to have intermittent hoarseness of voice which fluctuates during the day.  Right facial droop appears to have improved.  Family has also noticed improvement in the right eyelid droop.  Denies any difficulty getting out of her chair or climbing steps.  She reports increased tiredness but denies significant fatigability.  He does endorse that she gets short of breath more easily. ROS:   14 system review of systems is positive for drooping of the eyelid, hoarseness of voice, tiredness, shortness of breath, facial droop and all other systems negative  PMH:  Past Medical History:  Diagnosis Date   Osteoporosis 10/12/2012    Social History:  Social History   Socioeconomic History   Marital status: Divorced    Spouse name: Not on file   Number of children: Not on file   Years of education: Not on file   Highest education level: Not on file  Occupational History   Not on file  Tobacco Use   Smoking status: Never   Smokeless tobacco: Never  Substance and Sexual Activity   Alcohol use: Yes   Drug use: No   Sexual activity: Not on file  Other Topics Concern   Not on file  Social History Narrative   Not on file   Social Determinants of Health   Financial Resource Strain: Not on file  Food Insecurity: No Food Insecurity (03/29/2022)   Hunger Vital Sign    Worried About Running Out of Food in the  Last Year: Never true    [place] in the Last Year: Never true  Transportation Needs: No Transportation Needs (03/29/2022)   PRAPARE - Hydrologist (Medical): No    Lack of Transportation (Non-Medical): No  Physical Activity: Not on file  Stress: Not on file  Social Connections: Not on file  Intimate Partner Violence: Not At Risk (03/29/2022)   Humiliation, Afraid, Rape, and Kick questionnaire    Fear of Current or Ex-Partner: No    Emotionally Abused: No    Physically Abused: No     Sexually Abused: No    Medications:   Current Outpatient Medications on File Prior to Visit  Medication Sig Dispense Refill   aspirin EC 81 MG tablet Take 1 tablet (81 mg total) by mouth daily. Swallow whole. 30 tablet 12   Cholecalciferol (VITAMIN D-3) 5000 UNITS TABS Take 5,000 Units by mouth daily.     clopidogrel (PLAVIX) 75 MG tablet Take 1 tablet (75 mg total) by mouth daily for 21 days. 21 tablet 0   ezetimibe (ZETIA) 10 MG tablet Take 1 tablet (10 mg total) by mouth daily. 30 tablet 3   lisinopril (ZESTRIL) 20 MG tablet Take 1 tablet (20 mg total) by mouth daily. 30 tablet 1   Multiple Vitamins-Minerals (MULTIVITAMIN WITH MINERALS) tablet Take 1 tablet by mouth daily.     No current facility-administered medications on file prior to visit.    Allergies:  No Known Allergies  Physical Exam General: well developed, well nourished, seated, in no evident distress Head: head normocephalic and atraumatic.   Neck: supple with no carotid or supraclavicular bruits Cardiovascular: regular rate and rhythm, no murmurs Musculoskeletal: no deformity Skin:  no rash/petichiae Vascular:  Normal pulses all extremities  Neurologic Exam Mental Status: Awake and fully alert. Oriented to place and time. Recent and remote memory intact. Attention span, concentration and fund of knowledge appropriate. Mood and affect appropriate.  Speech is mostly clear with only occasional hoarseness. Cranial Nerves: Fundoscopic exam reveals sharp disc margins. Pupils equal, briskly reactive to light. Extraocular movements full without nystagmus. Visual fields full to confrontation. Hearing intact. Facial sensation intact. Face, tongue, palate moves normally and symmetrically.  Very subtle right eyelid droop which increases with sustained upgaze.  No diplopia.  No ophthalmoplegia. Motor: Normal bulk and tone. Normal strength in all tested extremity muscles. Sensory.: intact to touch , pinprick , position and vibratory  sensation.  Coordination: Rapid alternating movements normal in all extremities. Finger-to-nose and heel-to-shin performed accurately bilaterally. Gait and Station: Arises from chair without difficulty. Stance is normal. Gait demonstrates normal stride length and balance . Able to heel, toe and tandem walk without difficulty.  Reflexes: 1+ and symmetric. Toes downgoing.   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  I recommend further evaluation by checking acetylcholine receptor antibodies and nerve conduction studies with  rapid repetitive stimulation.  If myasthenia is confirmed may need referral to cardiothoracic surgeon for removal of mediastinal mass.  Her symptoms of myasthenia quite mild and intermittent and do not justify medications at the present time.  If workup for myasthenia is negative we will pursue workup for malignancy elsewhere..She also had a silent right frontal subcortical infarct which cannot explain her presentation.  Continue aspirin and Plavix for 3 weeks followed by aspirin alone and vascular risk factor modification.  Zetia for now for hyperlipidemia but may consider switching to statin after myasthenia ruled out or treated.  Return for follow-up in 3 months or call earlier if necessary.  Greater  than 50% time during this 45-minute consultation visit was spent on counseling and coordination of care about her silent lacunar infarct as well as changes of myasthenic syndrome and discussion about further evaluation and treatment and answering questions. Antony Contras, MD.  Note: This document was prepared with digital dictation and possible smart phrase technology. Any transcriptional errors that result from this process are unintentional.

## 2022-03-31 NOTE — Telephone Encounter (Addendum)
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

## 2022-03-31 NOTE — Patient Instructions (Addendum)
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  I recommend further evaluation by checking acetylcholine receptor antibodies and nerve conduction studies with rapid repetitive stimulation.  If myasthenia is confirmed may need referral to cardiothoracic surgeon for removal of mediastinal mass.  Her symptoms of myasthenia quite mild and intermittent and do not justify medications at the present time.  She had a silent right frontal subcortical infarct which cannot explain her presentation.  Continue aspirin and Plavix for 3 weeks followed by aspirin alone and vascular risk factor modification.  Zetia for now for hyperlipidemia but may consider switching to statin after myasthenia ruled out or treated.  Return for follow-up in 3 months or call earlier if necessary.  Stroke Prevention Some medical conditions and behaviors can lead to a higher chance of having a stroke. You can help prevent a stroke by eating healthy, exercising, not smoking, and managing any medical conditions you have. Stroke is a leading cause of functional impairment. Primary prevention is particularly important because a majority of strokes are first-time events. Stroke changes the lives of not only those who experience a stroke but also their family and other caregivers. How can this condition affect me? A stroke is a medical emergency and should be treated right away. A stroke can lead to brain damage and can sometimes be life-threatening. If a person gets medical treatment right away, there is a better chance of surviving and recovering from a stroke. What can increase my risk? The following medical conditions may increase your risk of a stroke: Cardiovascular disease. High blood pressure (hypertension). Diabetes. High  cholesterol. Sickle cell disease. Blood clotting disorders (hypercoagulable state). Obesity. Sleep disorders (obstructive sleep apnea). Other risk factors include: Being older than age 34. Having a history of blood clots, stroke, or mini-stroke (transient ischemic attack, TIA). Genetic factors, such as race, ethnicity, or a family history of stroke. Smoking cigarettes or using other tobacco products. Taking birth control pills, especially if you also use tobacco. Heavy use of alcohol or drugs, especially cocaine and methamphetamine. Physical inactivity. What actions can I take to prevent this? Manage your health conditions High cholesterol levels. Eating a healthy diet is important for preventing high cholesterol. If cholesterol cannot be managed through diet alone, you may need to take medicines. Take any prescribed medicines to control your cholesterol as told by your health care provider. Hypertension. To reduce your risk of stroke, try to keep your blood pressure below 130/80. Eating a healthy diet and exercising regularly are important for controlling blood pressure. If these steps are not enough to manage your blood pressure, you may need to take medicines. Take any prescribed medicines to control hypertension as told by your health care provider. Ask your health care provider if you should monitor your blood pressure at home. Have your blood pressure checked every year, even if your blood pressure is normal. Blood pressure increases with age and some medical conditions. Diabetes. Eating a healthy diet and exercising regularly are important parts of managing your blood sugar (glucose). If your blood sugar cannot be managed through diet and exercise, you may need to take medicines. Take any prescribed medicines to control your diabetes as told by your health care provider. Get evaluated for obstructive sleep apnea. Talk to your health care provider about getting a sleep evaluation if  you  snore a lot or have excessive sleepiness. Make sure that any other medical conditions you have, such as atrial fibrillation or atherosclerosis, are managed. Nutrition Follow instructions from your health care provider about what to eat or drink to help manage your health condition. These instructions may include: Reducing your daily calorie intake. Limiting how much salt (sodium) you use to 1,500 milligrams (mg) each day. Using only healthy fats for cooking, such as olive oil, canola oil, or sunflower oil. Eating healthy foods. You can do this by: Choosing foods that are high in fiber, such as whole grains, and fresh fruits and vegetables. Eating at least 5 servings of fruits and vegetables a day. Try to fill one-half of your plate with fruits and vegetables at each meal. Choosing lean protein foods, such as lean cuts of meat, poultry without skin, fish, tofu, beans, and nuts. Eating low-fat dairy products. Avoiding foods that are high in sodium. This can help lower blood pressure. Avoiding foods that have saturated fat, trans fat, and cholesterol. This can help prevent high cholesterol. Avoiding processed and prepared foods. Counting your daily carbohydrate intake.  Lifestyle If you drink alcohol: Limit how much you have to: 0-1 drink a day for women who are not pregnant. 0-2 drinks a day for men. Know how much alcohol is in your drink. In the [place] one drink equals one 12 oz bottle of beer (340m), one 5 oz glass of wine (1487m, or one 1 oz glass of hard liquor (4467m Do not use any products that contain nicotine or tobacco. These products include cigarettes, chewing tobacco, and vaping devices, such as e-cigarettes. If you need help quitting, ask your health care provider. Avoid secondhand smoke. Do not use drugs. Activity  Try to stay at a healthy weight. Get at least 30 minutes of exercise on most days, such as: Fast walking. Biking. Swimming. Medicines Take  over-the-counter and prescription medicines only as told by your health care provider. Aspirin or blood thinners (antiplatelets or anticoagulants) may be recommended to reduce your risk of forming blood clots that can lead to stroke. Avoid taking birth control pills. Talk to your health care provider about the risks of taking birth control pills if: You are over 35 83ars old. You smoke. You get very bad headaches. You have had a blood clot. Where to find more information American Stroke Association: www.strokeassociation.org Get help right away if: You or a loved one has any symptoms of a stroke. "BE FAST" is an easy way to remember the main warning signs of a stroke: B - Balance. Signs are dizziness, sudden trouble walking, or loss of balance. E - Eyes. Signs are trouble seeing or a sudden change in vision. F - Face. Signs are sudden weakness or numbness of the face, or the face or eyelid drooping on one side. A - Arms. Signs are weakness or numbness in an arm. This happens suddenly and usually on one side of the body. S - Speech. Signs are sudden trouble speaking, slurred speech, or trouble understanding what people say. T - Time. Time to call emergency services. Write down what time symptoms started. You or a loved one has other signs of a stroke, such as: A sudden, severe headache with no known cause. Nausea or vomiting. Seizure. These symptoms may represent a serious problem that is an emergency. Do not wait to see if the symptoms will go away. Get medical help right away. Call your local emergency services (911 in the [place] Do not drive  yourself to the hospital. Summary You can help to prevent a stroke by eating healthy, exercising, not smoking, limiting alcohol intake, and managing any medical conditions you may have. Do not use any products that contain nicotine or tobacco. These include cigarettes, chewing tobacco, and vaping devices, such as e-cigarettes. If you need help quitting,  ask your health care provider. Remember "BE FAST" for warning signs of a stroke. Get help right away if you or a loved one has any of these signs. This information is not intended to replace advice given to you by your health care provider. Make sure you discuss any questions you have with your health care provider. Document Revised: 10/07/2019 Document Reviewed: 10/07/2019 Elsevier Patient Education  [place].

## 2022-04-05 ENCOUNTER — Telehealth: Payer: Self-pay | Admitting: Neurology

## 2022-04-05 ENCOUNTER — Other Ambulatory Visit: Payer: Self-pay | Admitting: Neurology

## 2022-04-05 ENCOUNTER — Telehealth: Payer: Self-pay

## 2022-04-05 DIAGNOSIS — G7000 Myasthenia gravis without (acute) exacerbation: Secondary | ICD-10-CM

## 2022-04-05 DIAGNOSIS — D4989 Neoplasm of unspecified behavior of other specified sites: Secondary | ICD-10-CM

## 2022-04-05 LAB — ACETYLCHOLINE RECEPTOR, BLOCKING: Acetylchol Block Ab: 51 % — ABNORMAL HIGH (ref 0–25)

## 2022-04-05 LAB — ACETYLCHOLINE RECEPTOR, BINDING: AChR Binding Ab, Serum: 27.8 nmol/L — ABNORMAL HIGH (ref 0.00–0.24)

## 2022-04-05 NOTE — Telephone Encounter (Signed)
I received lab results for acetylcholine receptor binding antibodies being strongly positive.  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  I called and spoke to the patient's daughter and recommended urgent referral to cardiothoracic surgery for thymoma removal.  She voiced understanding.  I placed a referral in epic

## 2022-04-05 NOTE — Telephone Encounter (Signed)
Pt is calling. Stated she would like to speak with Dr. Leonie Man please call [phone].

## 2022-04-06 ENCOUNTER — Telehealth: Payer: Self-pay | Admitting: Neurology

## 2022-04-06 NOTE — Telephone Encounter (Signed)
Hey Dr. Leonie Man,  In the process of scheduling an appointment for Faith Massey with Dr. Krista Blue, she wanted to check with you on something. She mentioned you spoke with her daughter yesterday and she was told no further testing was need at this time. Patient is wondering if she NCS needs to be cancelled and if the PET Scan in no longer needed?

## 2022-04-10 NOTE — Progress Notes (Signed)
I have already communicated these results to the patient's daughter and made an urgent referral to cardiothoracic surgery.  I spoken to Dr. Roxan Hockey who plans to see the patient next week

## 2022-04-11 ENCOUNTER — Ambulatory Visit (HOSPITAL_BASED_OUTPATIENT_CLINIC_OR_DEPARTMENT_OTHER): Payer: Medicare HMO | Admitting: Pulmonary Disease

## 2022-04-12 ENCOUNTER — Encounter: Payer: Self-pay | Admitting: Neurology

## 2022-04-12 ENCOUNTER — Ambulatory Visit: Payer: Medicare HMO | Admitting: Neurology

## 2022-04-12 VITALS — BP 161/94 | HR 107 | Ht 58.5 in | Wt 114.0 lb

## 2022-04-12 DIAGNOSIS — D4989 Neoplasm of unspecified behavior of other specified sites: Secondary | ICD-10-CM

## 2022-04-12 DIAGNOSIS — G709 Myoneural disorder, unspecified: Secondary | ICD-10-CM

## 2022-04-12 HISTORY — DX: Myoneural disorder, unspecified: G70.9

## 2022-04-12 HISTORY — DX: Neoplasm of unspecified behavior of other specified sites: D49.89

## 2022-04-12 MED ORDER — PYRIDOSTIGMINE BROMIDE 60 MG PO TABS: 60.0000 mg | ORAL_TABLET | Freq: Three times a day (TID) | ORAL | 5 refills | Status: DC

## 2022-04-12 NOTE — Progress Notes (Signed)
Chief Complaint  Patient presents with   New Patient (Initial Visit)    Rm 14 with sister. Here for consult as requested by Dr. Leonie Man for Myasthenia. Pt reports since hospital fatigue, shortness of breath, difficulty with swallowing, and balance difficulty.       ASSESSMENT AND PLAN  Faith Massey is a 77 y.o. 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 Roxan Hockey on April 13, 2022,  On examination today, she has mild right ptosis, mild bulbar weakness, mild to moderate neck flexion, proximal upper and lower extremity weakness,  Mestinon 60 mg 3 times a day as needed  Preauthorization for IVIG, 2 g/kg loading dose followed by 2 g/kg maintenance dose every 4 weeks   DIAGNOSTIC DATA (LABS, IMAGING, TESTING) - I reviewed patient records, labs, notes, testing and imaging myself where available.   MEDICAL HISTORY:  Faith Massey is a 77 year old female, accompanied by her sister Manuela Schwartz, seen in request by Dr. Garvin Fila, for evaluation of myasthenia gravis,   I reviewed and summarized the referring note.PMHX HTN HLD Breast Cancer, s/p double mastectomy in 1990  Patient lives in independent living, very active, has not seen her primary care since beginning of 2023,  In December 2023, she noticed lack of stamina, easily fatigued, shortness of breath walking her dog walking up hills,  Woke up March 25, 2022, noticed right ptosis, intermittent hoarse voice, which has been persistent that was noticed by her church friend,  and family members, who urged her to call primary care on January 8, who directed her to emergency room, she had a stroke evaluation, personally reviewed MRI of the brain March 28, 2022, 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 Leonie Man on March 31, 2022, she was noted to have right ptosis, mild bulbar weakness,  Acetylcholine receptor antibody was positive, blocking 51, binding 27.8, confirmed the diagnosis of myasthenia gravis, she has pending appointment with cardiothoracic surgeon Dr. Roxan Hockey on April 13, 2022  Rest of the laboratory evaluation showed normal CPK TSH, A1c 5.7, lipid panel LDL of 125, CBC WBC was elevated 12, normal hemoglobin of 13, CMP showed calcium 8.7, normal kidney function  Also discussed with her son Annie Main over the phone in detail about her diagnosis,  PHYSICAL EXAM:   Vitals:   04/12/22 1525  BP: (!) 161/94  Pulse: (!) 107  Weight: 114 lb (51.7 kg)  Height: 4' 10.5" (1.486 m)   Not recorded     Body mass index is 23.42 kg/m.  PHYSICAL EXAMNIATION:  Gen: NAD, conversant, well nourised, well groomed                     Cardiovascular: Regular rate rhythm, no peripheral edema, warm, nontender. Eyes: Conjunctivae clear without exudates or hemorrhage Neck:  Supple, no carotid bruits. Pulmonary: Clear to auscultation bilaterally   NEUROLOGICAL EXAM:  MENTAL STATUS: Speech/cognition: Awake, alert, oriented to history taking and casual conversation CRANIAL NERVES: CN II: Visual fields are full to confrontation. Pupils are round equal and briskly reactive to light. CN III, IV, VI: extraocular movement are normal.   Fatigable ptosis, most noticeable on the right side, cover and uncover testing showed mild exophoria bilaterally CN V: Facial sensation is intact to light touch CN VII: Mild eye closure, cheek puff weakness CN VIII: Hearing is normal to causal conversation. CN IX, X: Phonation is normal. CN XI: Head turning and shoulder shrug are intact  MOTOR: Mild to moderate neck flexion, shoulder abduction, external rotation, elbow flexion, bilateral hip flexion weakness  REFLEXES: Reflexes are 1  and symmetric at the biceps, triceps, knees, and ankles. Plantar responses are flexor.  SENSORY: Intact to light touch, pinprick and vibratory sensation are intact in fingers and toes.  COORDINATION: There is no trunk or limb dysmetria noted.  GAIT/STANCE: She can get up from seated position, squatting down without pushing, steady  REVIEW OF SYSTEMS:  Full 14 system review of systems performed and notable only for as above All other review of systems were negative.   ALLERGIES: No Known Allergies  HOME MEDICATIONS: Current Outpatient Medications  Medication Sig Dispense Refill   aspirin EC 81 MG tablet Take 1 tablet (81 mg total) by mouth daily. Swallow whole. 30 tablet 12   Cholecalciferol (VITAMIN D-3) 5000 UNITS TABS Take 5,000 Units by mouth daily.     clopidogrel (PLAVIX) 75 MG tablet Take 1 tablet (75 mg total) by mouth daily for 21 days. 21 tablet 0   COLLAGEN PO Take by mouth.     ezetimibe (ZETIA) 10 MG tablet Take 1 tablet (10 mg total) by mouth daily. 30 tablet 3   lisinopril (ZESTRIL) 20 MG tablet Take 1 tablet (20 mg total) by mouth daily. 30 tablet 1   Multiple Vitamins-Minerals (MULTIVITAMIN WITH MINERALS) tablet Take 1 tablet by mouth daily.     No current facility-administered medications for this visit.    PAST MEDICAL HISTORY: Past Medical History:  Diagnosis Date   Osteoporosis 10/12/2012    PAST SURGICAL HISTORY: Past Surgical History:  Procedure Laterality Date    ABDOMINAL HYSTERECTOMY  2005   BACK SURGERY  2000   MASTECTOMY Bilateral 1995    FAMILY HISTORY: Family History  Problem Relation Age of Onset   Stroke Mother    Hypertension Mother    Myasthenia gravis Mother    Hypertension Father     SOCIAL HISTORY: Social History   Socioeconomic History   Marital status: Divorced    Spouse name: Not on file   Number of children: Not on file   Years of education: Not on file   Highest education level: Not on file  Occupational History   Not on file  Tobacco Use   Smoking status: Never   Smokeless tobacco: Never  Substance and Sexual Activity   Alcohol use: Yes   Drug use: No   Sexual activity: Not on file  Other Topics Concern   Not on file  Social History Narrative   Not on file   Social Determinants of Health   Financial Resource Strain: Not on file  Food Insecurity: No Food Insecurity (03/29/2022)   Hunger Vital Sign    Worried About Running Out of Food in the Last Year: Never true    Ran Out of Food in the  Last Year: Never true  Transportation Needs: No Transportation Needs (03/29/2022)   PRAPARE - Hydrologist (Medical): No    Lack of Transportation (Non-Medical): No  Physical Activity: Not on file  Stress: Not on file  Social Connections: Not on file  Intimate Partner Violence: Not At Risk (03/29/2022)   Humiliation, Afraid, Rape, and Kick questionnaire    Fear of Current or Ex-Partner: No    Emotionally Abused: No    Physically Abused: No    Sexually Abused: No    Total time spent reviewing the chart, obtaining history, examined patient, ordering tests, documentation, consultations and family, care coordination was  65 minutes.    Marcial Pacas, M.D. Ph.D.  [facility] [address] [place], [place] [address] Ph: [phone] Fax: [phone]  CC:  Garvin Fila, [place],  [place] [address]  Patient, No Pcp Per

## 2022-04-12 NOTE — Patient Instructions (Addendum)
Prior authorization for IVIG.  Meds ordered this encounter  Medications   pyridostigmine (MESTINON) 60 MG tablet    Sig: Take 1 tablet (60 mg total) by mouth 3 (three) times daily.    Dispense:  90 tablet    Refill:  5

## 2022-04-13 ENCOUNTER — Encounter: Payer: Self-pay | Admitting: Thoracic Surgery (Cardiothoracic Vascular Surgery)

## 2022-04-13 ENCOUNTER — Institutional Professional Consult (permissible substitution) (INDEPENDENT_AMBULATORY_CARE_PROVIDER_SITE_OTHER): Payer: Medicare HMO | Admitting: Thoracic Surgery (Cardiothoracic Vascular Surgery)

## 2022-04-13 VITALS — BP 170/86 | HR 100 | Resp 20 | Ht 58.5 in | Wt 114.0 lb

## 2022-04-13 DIAGNOSIS — J9859 Other diseases of mediastinum, not elsewhere classified: Secondary | ICD-10-CM

## 2022-04-13 NOTE — Progress Notes (Signed)
PCP is Patient, No Pcp Per Referring Provider is Garvin Fila, MD  Chief Complaint  Patient presents with   Mediastinal Mass    Surgical consult, CTA Chest 03/28/22    HPI: [name] is sent for consultation regarding an anterior mediastinal mass.  Faith Massey is a 77 year old woman with a past history significant for hypertension, hyperlipidemia, breast cancer (1990) and osteoporosis.  She presented with right eye ptosis and hoarseness progressing to near complete aphonia.  MRI showed a possible small subacute infarct in the right frontal lobe.  She was transferred to [facility] for stroke workup.  She was started on aspirin and Plavix.  As part of that workup she had a CT of the head and neck which showed an anterior mediastinal mass.  Further workup revealed marked elevation of the acetylcholine receptor antibodies confirming the diagnosis of myasthenia gravis.  Her symptoms improved and she was discharged.  She recently saw Dr. Krista Blue of neurology.  She gave her prescription for pyridostigmine.  She has not started that prescription yet.  Her Plavix was discontinued.  She has noticed that she gets short of breath when walking up inclines that she used to be able to do easily.  No further phonation issues.  No double vision but has a little slight droop in her right eyelid.  Zubrod Score: At the time of surgery this patient's most appropriate activity status/level should be described as: '[]'$     0    Normal activity, no symptoms '[]'$     1    Restricted in physical strenuous activity but ambulatory, able to do out light work '[x]'$     2    Ambulatory and capable of self care, unable to do work activities, up and about >50 % of waking hours                              '[]'$     3    Only limited self care, in bed greater than 50% of waking hours '[]'$     4    Completely disabled, no self care, confined to bed or chair '[]'$     5    Moribund  Past Medical History:  Diagnosis Date   Osteoporosis 10/12/2012     Past Surgical History:  Procedure Laterality Date   ABDOMINAL HYSTERECTOMY  2005   BACK SURGERY  2000   MASTECTOMY Bilateral 1995    Family History  Problem Relation Age of Onset   Stroke Mother    Hypertension Mother    Myasthenia gravis Mother    Hypertension Father     Social History Social History   Tobacco Use   Smoking status: Never   Smokeless tobacco: Never  Substance Use Topics   Alcohol use: Yes   Drug use: No    Current Outpatient Medications  Medication Sig Dispense Refill   aspirin EC 81 MG tablet Take 1 tablet (81 mg total) by mouth daily. Swallow whole. 30 tablet 12   Cholecalciferol (VITAMIN D-3) 5000 UNITS TABS Take 5,000 Units by mouth daily.     clopidogrel (PLAVIX) 75 MG tablet Take 1 tablet (75 mg total) by mouth daily for 21 days. 21 tablet 0   COLLAGEN PO Take by mouth.     ezetimibe (ZETIA) 10 MG tablet Take 1 tablet (10 mg total) by mouth daily. 30 tablet 3   lisinopril (ZESTRIL) 20 MG tablet Take 1 tablet (20 mg total) by  mouth daily. 30 tablet 1   Multiple Vitamins-Minerals (MULTIVITAMIN WITH MINERALS) tablet Take 1 tablet by mouth daily.     pyridostigmine (MESTINON) 60 MG tablet Take 1 tablet (60 mg total) by mouth 3 (three) times daily. 90 tablet 5   No current facility-administered medications for this visit.    No Known Allergies  Review of Systems  Constitutional:  Positive for activity change and fatigue. Negative for unexpected weight change.  HENT:  Positive for trouble swallowing and voice change.   Eyes:  Positive for visual disturbance.  Respiratory:  Positive for shortness of breath.   Cardiovascular:  Negative for chest pain and leg swelling.  Genitourinary:  Negative for difficulty urinating and dyspareunia.  Musculoskeletal:  Negative for arthralgias and myalgias.  Neurological:  Positive for speech difficulty and weakness. Negative for syncope.  Hematological:  Bruises/bleeds easily.  All other systems reviewed and  are negative.   BP (!) 170/86   Pulse 100   Resp 20   Ht 4' 10.5" (1.486 m)   Wt 114 lb (51.7 kg)   SpO2 98% Comment: RA  BMI 23.42 kg/m  Physical Exam Vitals reviewed.  Constitutional:      Appearance: Normal appearance.  HENT:     Head: Normocephalic and atraumatic.  Eyes:     General: No scleral icterus.    Extraocular Movements: Extraocular movements intact.  Neck:     Vascular: No carotid bruit.  Cardiovascular:     Rate and Rhythm: Normal rate and regular rhythm.     Heart sounds: Normal heart sounds. No murmur heard.    No friction rub. No gallop.  Pulmonary:     Effort: Pulmonary effort is normal. No respiratory distress.     Breath sounds: Normal breath sounds. No wheezing or rales.  Abdominal:     General: There is no distension.     Palpations: Abdomen is soft.  Lymphadenopathy:     Cervical: No cervical adenopathy.  Skin:    General: Skin is warm and dry.  Neurological:     Mental Status: She is alert and oriented to person, place, and time.     Cranial Nerves: Cranial nerve deficit present.     Motor: No weakness.     Comments: Mild right ptosis     Diagnostic Tests: CT ANGIOGRAPHY CHEST WITH CONTRAST   TECHNIQUE: Multidetector CT imaging of the chest was performed using the standard protocol during bolus administration of intravenous contrast. Multiplanar CT image reconstructions and MIPs were obtained to evaluate the vascular anatomy.   RADIATION DOSE REDUCTION: This exam was performed according to the departmental dose-optimization program which includes automated exposure control, adjustment of the mA and/or kV according to patient size and/or use of iterative reconstruction technique.   CONTRAST:  30m OMNIPAQUE IOHEXOL 350 MG/ML SOLN   COMPARISON:  CT 03/28/2022   FINDINGS: Cardiovascular: Satisfactory opacification of the pulmonary arteries to the segmental level. No evidence of pulmonary embolism. Nonaneurysmal aorta. Mild  atherosclerosis. Normal cardiac size. No pericardial effusion.   Mediastinum/Nodes: Midline trachea. No thyroid mass. Esophagus within normal limits. No suspicious lymph nodes.   Right anterior mediastinal soft tissue mass, this measures 3 by 4 by 5.3 cm.   Lungs/Pleura: Negative for pneumothorax or pleural effusion. Bandlike atelectasis at the lung bases.   Upper Abdomen: No acute finding   Musculoskeletal: Bilateral breast implants with mild capsular calcification. No acute osseous abnormality.   Review of the MIP images confirms the above findings.   IMPRESSION: 1.  Negative for acute pulmonary embolus. 2. 3 x 4 x 5.3 cm soft tissue mass within the right anterior mediastinum. Mass could represent thymoma or other thymic mass, lymphoma, or metastatic focus.   Aortic Atherosclerosis (ICD10-I70.0).     Electronically Signed   By: Donavan Foil M.D.   On: 03/28/2022 22:17 I personally reviewed the CT images.  There is a 3 x 4 x 5 cm anterior mediastinal mass on the right side abutting the aorta, superior vena cava, and right atrium.   Acetylcholine blocking antibody 51% Acetylcholine receptor binding antibody 27.80  Impression: Faith Massey is a 77 year old woman with a past history significant for hypertension, hyperlipidemia, breast cancer (1990) and osteoporosis.  She recently presented with a right facial droop and aphonia.  Workup revealed positive acetylcholine receptor binding antibodies consistent with myasthenia gravis gravis.  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  Differential diagnosis includes thymoma, thymic carcinoma, germ cell tumors, and lymphoma.  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  I recommended to Faith Massey that we proceed with surgical resection.  She is accompanied by her daughter.  I informed them of the options for resection including a robotic  approach and sternotomy.  She wishes to attempt a robotic approach, but understands there may be need for conversion to a sternotomy.  I informed her of the general nature of the procedure including the need for general anesthesia, the incisions to be used, the use of the surgical robot, the use of drains to postoperatively, the possible need for conversion to sternotomy, the expected hospital stay, and the overall recovery.  I informed her of the indications, risks, benefits, and alternatives.  She understands the risks include, but not limited to death, MI, DVT, PE, bleeding, possible need for transfusion, infection, cardiac arrhythmias, significant possibility of phrenic nerve injury or resection, recurrent nerve injury, respiratory failure, as well as the possibility of other unforeseeable complications.  She understands and accepts the risks and agrees to proceed.  Plan: Robotic right VATS for thymectomy, possible sternotomy on Monday, 04/25/2022.  Melrose Nakayama, MD [facility] [phone]  I spent over 60 minutes in review of records, images, and in consultation with Faith Massey today.

## 2022-04-13 NOTE — H&P (View-Only) (Signed)
PCP is Patient, No Pcp Per Referring Provider is Garvin Fila, MD  Chief Complaint  Patient presents with   Mediastinal Mass    Surgical consult, CTA Chest 03/28/22    HPI: Faith Massey is sent for consultation regarding an anterior mediastinal mass.  Faith Massey is a 77 year old woman with a past history significant for hypertension, hyperlipidemia, breast cancer (1990) and osteoporosis.  She presented with right eye ptosis and hoarseness progressing to near complete aphonia.  MRI showed a possible small subacute infarct in the right frontal lobe.  She was transferred to [facility] for stroke workup.  She was started on aspirin and Plavix.  As part of that workup she had a CT of the head and neck which showed an anterior mediastinal mass.  Further workup revealed marked elevation of the acetylcholine receptor antibodies confirming the diagnosis of myasthenia gravis.  Her symptoms improved and she was discharged.  She recently saw Dr. Krista Blue of neurology.  She gave her prescription for pyridostigmine.  She has not started that prescription yet.  Her Plavix was discontinued.  She has noticed that she gets short of breath when walking up inclines that she used to be able to do easily.  No further phonation issues.  No double vision but has a little slight droop in her right eyelid.  Zubrod Score: At the time of surgery this patient's most appropriate activity status/level should be described as: '[]'$     0    Normal activity, no symptoms '[]'$     1    Restricted in physical strenuous activity but ambulatory, able to do out light work '[x]'$     2    Ambulatory and capable of self care, unable to do work activities, up and about >50 % of waking hours                              '[]'$     3    Only limited self care, in bed greater than 50% of waking hours '[]'$     4    Completely disabled, no self care, confined to bed or chair '[]'$     5    Moribund  Past Medical History:  Diagnosis Date   Osteoporosis 10/12/2012     Past Surgical History:  Procedure Laterality Date   ABDOMINAL HYSTERECTOMY  2005   BACK SURGERY  2000   MASTECTOMY Bilateral 1995    Family History  Problem Relation Age of Onset   Stroke Mother    Hypertension Mother    Myasthenia gravis Mother    Hypertension Father     Social History Social History   Tobacco Use   Smoking status: Never   Smokeless tobacco: Never  Substance Use Topics   Alcohol use: Yes   Drug use: No    Current Outpatient Medications  Medication Sig Dispense Refill   aspirin EC 81 MG tablet Take 1 tablet (81 mg total) by mouth daily. Swallow whole. 30 tablet 12   Cholecalciferol (VITAMIN D-3) 5000 UNITS TABS Take 5,000 Units by mouth daily.     clopidogrel (PLAVIX) 75 MG tablet Take 1 tablet (75 mg total) by mouth daily for 21 days. 21 tablet 0   COLLAGEN PO Take by mouth.     ezetimibe (ZETIA) 10 MG tablet Take 1 tablet (10 mg total) by mouth daily. 30 tablet 3   lisinopril (ZESTRIL) 20 MG tablet Take 1 tablet (20 mg total) by  mouth daily. 30 tablet 1   Multiple Vitamins-Minerals (MULTIVITAMIN WITH MINERALS) tablet Take 1 tablet by mouth daily.     pyridostigmine (MESTINON) 60 MG tablet Take 1 tablet (60 mg total) by mouth 3 (three) times daily. 90 tablet 5   No current facility-administered medications for this visit.    No Known Allergies  Review of Systems  Constitutional:  Positive for activity change and fatigue. Negative for unexpected weight change.  HENT:  Positive for trouble swallowing and voice change.   Eyes:  Positive for visual disturbance.  Respiratory:  Positive for shortness of breath.   Cardiovascular:  Negative for chest pain and leg swelling.  Genitourinary:  Negative for difficulty urinating and dyspareunia.  Musculoskeletal:  Negative for arthralgias and myalgias.  Neurological:  Positive for speech difficulty and weakness. Negative for syncope.  Hematological:  Bruises/bleeds easily.  All other systems reviewed and  are negative.   BP (!) 170/86   Pulse 100   Resp 20   Ht 4' 10.5" (1.486 m)   Wt 114 lb (51.7 kg)   SpO2 98% Comment: RA  BMI 23.42 kg/m  Physical Exam Vitals reviewed.  Constitutional:      Appearance: Normal appearance.  HENT:     Head: Normocephalic and atraumatic.  Eyes:     General: No scleral icterus.    Extraocular Movements: Extraocular movements intact.  Neck:     Vascular: No carotid bruit.  Cardiovascular:     Rate and Rhythm: Normal rate and regular rhythm.     Heart sounds: Normal heart sounds. No murmur heard.    No friction rub. No gallop.  Pulmonary:     Effort: Pulmonary effort is normal. No respiratory distress.     Breath sounds: Normal breath sounds. No wheezing or rales.  Abdominal:     General: There is no distension.     Palpations: Abdomen is soft.  Lymphadenopathy:     Cervical: No cervical adenopathy.  Skin:    General: Skin is warm and dry.  Neurological:     Mental Status: She is alert and oriented to person, place, and time.     Cranial Nerves: Cranial nerve deficit present.     Motor: No weakness.     Comments: Mild right ptosis     Diagnostic Tests: CT ANGIOGRAPHY CHEST WITH CONTRAST   TECHNIQUE: Multidetector CT imaging of the chest was performed using the standard protocol during bolus administration of intravenous contrast. Multiplanar CT image reconstructions and MIPs were obtained to evaluate the vascular anatomy.   RADIATION DOSE REDUCTION: This exam was performed according to the departmental dose-optimization program which includes automated exposure control, adjustment of the mA and/or kV according to patient size and/or use of iterative reconstruction technique.   CONTRAST:  10m OMNIPAQUE IOHEXOL 350 MG/ML SOLN   COMPARISON:  CT 03/28/2022   FINDINGS: Cardiovascular: Satisfactory opacification of the pulmonary arteries to the segmental level. No evidence of pulmonary embolism. Nonaneurysmal aorta. Mild  atherosclerosis. Normal cardiac size. No pericardial effusion.   Mediastinum/Nodes: Midline trachea. No thyroid mass. Esophagus within normal limits. No suspicious lymph nodes.   Right anterior mediastinal soft tissue mass, this measures 3 by 4 by 5.3 cm.   Lungs/Pleura: Negative for pneumothorax or pleural effusion. Bandlike atelectasis at the lung bases.   Upper Abdomen: No acute finding   Musculoskeletal: Bilateral breast implants with mild capsular calcification. No acute osseous abnormality.   Review of the MIP images confirms the above findings.   IMPRESSION: 1.  Negative for acute pulmonary embolus. 2. 3 x 4 x 5.3 cm soft tissue mass within the right anterior mediastinum. Mass could represent thymoma or other thymic mass, lymphoma, or metastatic focus.   Aortic Atherosclerosis (ICD10-I70.0).     Electronically Signed   By: Donavan Foil M.D.   On: 03/28/2022 22:17 I personally reviewed the CT images.  There is a 3 x 4 x 5 cm anterior mediastinal mass on the right side abutting the aorta, superior vena cava, and right atrium.   Acetylcholine blocking antibody 51% Acetylcholine receptor binding antibody 27.80  Impression: Faith Massey is a 77 year old woman with a past history significant for hypertension, hyperlipidemia, breast cancer (1990) and osteoporosis.  She recently presented with a right facial droop and aphonia.  Workup revealed positive acetylcholine receptor binding antibodies consistent with myasthenia gravis gravis.  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  Differential diagnosis includes thymoma, thymic carcinoma, germ cell tumors, and lymphoma.  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  I recommended to Faith Massey that we proceed with surgical resection.  She is accompanied by her daughter.  I informed them of the options for resection including a robotic  approach and sternotomy.  She wishes to attempt a robotic approach, but understands there may be need for conversion to a sternotomy.  I informed her of the general nature of the procedure including the need for general anesthesia, the incisions to be used, the use of the surgical robot, the use of drains to postoperatively, the possible need for conversion to sternotomy, the expected hospital stay, and the overall recovery.  I informed her of the indications, risks, benefits, and alternatives.  She understands the risks include, but not limited to death, MI, DVT, PE, bleeding, possible need for transfusion, infection, cardiac arrhythmias, significant possibility of phrenic nerve injury or resection, recurrent nerve injury, respiratory failure, as well as the possibility of other unforeseeable complications.  She understands and accepts the risks and agrees to proceed.  Plan: Robotic right VATS for thymectomy, possible sternotomy on Monday, 04/25/2022.  Melrose Nakayama, MD [facility] [phone]  I spent over 60 minutes in review of records, images, and in consultation with Faith Massey today.

## 2022-04-14 ENCOUNTER — Other Ambulatory Visit: Payer: Self-pay | Admitting: *Deleted

## 2022-04-14 ENCOUNTER — Encounter: Payer: Self-pay | Admitting: *Deleted

## 2022-04-14 ENCOUNTER — Telehealth: Payer: Self-pay

## 2022-04-14 DIAGNOSIS — J9859 Other diseases of mediastinum, not elsewhere classified: Secondary | ICD-10-CM

## 2022-04-14 DIAGNOSIS — G7000 Myasthenia gravis without (acute) exacerbation: Secondary | ICD-10-CM

## 2022-04-14 NOTE — Patient Outreach (Signed)
  [place] [place] Stroke Care Coordination Follow Up  04/14/2022 Name:  Faith Massey MRN:  [id number] DOB:  1946/02/21  Subjective: Faith Massey is a 77 y.o. year old female who is a primary care patient of Patient, No Pcp Per   An Emmi alert was received on 05/14/22 indicating patient responded to questions: Problems refilling medications?.   I reached out by phone to follow up on the alert and spoke to Patient. Patient states she is doing okay. She shares that she went to see cardiothoracic MD yesterday and will be having surgery soon. Patient has several upcoming appts and reviewed with her. She denies any issue with transportation. Reviewed and addressed red alert. Patient states there was a delay in getting her [place] filled by pharmacy. She was finally able to pick up med on yesterday. She denies any other RN CM needs or concerns at this time.   Care Coordination Interventions:  Yes, provided   Follow up plan: No further intervention required. Patient has completed automated EMMI-STROKE post discharge calls.  Encounter Outcome:  Pt. Visit Completed    Enzo Montgomery, RN,BSN,CCM [facility] Telephonic Care Management Coordinator Direct Phone: [phone] Toll Free: [phone] Fax: [phone]

## 2022-04-14 NOTE — Patient Outreach (Signed)
Received a red flag Emmi stroke notification for Faith Massey. I have assigned Enzo Montgomery, RN to call for follow up and determine if there are any Case Management needs.    [facility], [place], [place] [facility] [phone]

## 2022-04-18 LAB — MULTIPLE MYELOMA PANEL, SERUM
Albumin SerPl Elph-Mcnc: 4.2 g/dL (ref 2.9–4.4)
Albumin/Glob SerPl: 1.6 (ref 0.7–1.7)
Alpha 1: 0.3 g/dL (ref 0.0–0.4)
Alpha2 Glob SerPl Elph-Mcnc: 0.7 g/dL (ref 0.4–1.0)
B-Globulin SerPl Elph-Mcnc: 1.1 g/dL (ref 0.7–1.3)
Gamma Glob SerPl Elph-Mcnc: 0.6 g/dL (ref 0.4–1.8)
Globulin, Total: 2.7 g/dL (ref 2.2–3.9)
IgA/Immunoglobulin A, Serum: 245 mg/dL (ref 64–422)
IgG (Immunoglobin G), Serum: 623 mg/dL (ref 586–1602)
IgM (Immunoglobulin M), Srm: 57 mg/dL (ref 26–217)
Total Protein: 6.9 g/dL (ref 6.0–8.5)

## 2022-04-18 LAB — ANA W/REFLEX: Anti Nuclear Antibody (ANA): POSITIVE — AB

## 2022-04-18 LAB — ENA+DNA/DS+SJORGEN'S
ENA RNP Ab: <0.2 AI (ref 0.0–0.9)
ENA SM Ab Ser-aCnc: <0.2 AI (ref 0.0–0.9)
ENA SSA (RO) Ab: <0.2 AI (ref 0.0–0.9)
ENA SSB (LA) Ab: <0.2 AI (ref 0.0–0.9)
dsDNA Ab: 10 IU/mL — ABNORMAL HIGH (ref 0–9)

## 2022-04-20 ENCOUNTER — Encounter (HOSPITAL_COMMUNITY): Payer: Self-pay

## 2022-04-20 ENCOUNTER — Ambulatory Visit (HOSPITAL_BASED_OUTPATIENT_CLINIC_OR_DEPARTMENT_OTHER): Payer: Medicare HMO | Admitting: Pulmonary Disease

## 2022-04-20 DIAGNOSIS — I63321 Cerebral infarction due to thrombosis of right anterior cerebral artery: Secondary | ICD-10-CM | POA: Diagnosis not present

## 2022-04-20 NOTE — Pre-Procedure Instructions (Signed)
Surgical Instructions    Your procedure is scheduled on Monday, February 5th.  Report to [facility] Main Entrance "A" at 05:30 A.M., then check in with the Admitting office.  Call this number if you have problems the morning of surgery:  [phone]  If you have any questions prior to your surgery date call [phone]: Open Monday-Friday 8am-4pm If you experience any cold or flu symptoms such as cough, fever, chills, shortness of breath, etc. between now and your scheduled surgery, please notify us at the above number.     Remember:  Do not eat or drink after midnight the night before your surgery     Take these medicines the morning of surgery with A SIP OF WATER  ezetimibe (ZETIA)  pyridostigmine (MESTINON)    Stop PLAVIX 1/24.  As of today, STOP taking any Aleve, Naproxen, Ibuprofen, Motrin, Advil, Goody's, BC's, all herbal medications, fish oil, and all vitamins.                     Do NOT Smoke (Tobacco/Vaping) for 24 hours prior to your procedure.  If you use a CPAP at night, you may bring your mask/headgear for your overnight stay.   Contacts, glasses, piercing's, hearing aid's, dentures or partials may not be worn into surgery, please bring cases for these belongings.    For patients admitted to the hospital, discharge time will be determined by your treatment team.   Patients discharged the day of surgery will not be allowed to drive home, and someone needs to stay with them for 24 hours.  SURGICAL WAITING ROOM VISITATION Patients having surgery or a procedure may have no more than 2 support people in the waiting area - these visitors may rotate.   Children under the age of 64 must have an adult with them who is not the patient. If the patient needs to stay at the hospital during part of their recovery, the visitor guidelines for inpatient rooms apply. Pre-op nurse will coordinate an appropriate time for 1 support person to accompany patient in pre-op.  This  support person may not rotate.   Please refer to the [facility] website for the visitor guidelines for Inpatients (after your surgery is over and you are in a regular room).    Special instructions:   [facility]- Preparing For Surgery  Before surgery, you can play an important role. Because skin is not sterile, your skin needs to be as free of germs as possible. You can reduce the number of germs on your skin by washing with CHG (chlorahexidine gluconate) Soap before surgery.  CHG is an antiseptic cleaner which kills germs and bonds with the skin to continue killing germs even after washing.    Oral Hygiene is also important to reduce your risk of infection.  Remember - BRUSH YOUR TEETH THE MORNING OF SURGERY WITH YOUR REGULAR TOOTHPASTE  Please do not use if you have an allergy to CHG or antibacterial soaps. If your skin becomes reddened/irritated stop using the CHG.  Do not shave (including legs and underarms) for at least 48 hours prior to first CHG shower. It is OK to shave your face.  Please follow these instructions carefully.   Shower the NIGHT BEFORE SURGERY and the MORNING OF SURGERY  If you chose to wash your hair, wash your hair first as usual with your normal shampoo.  After you shampoo, rinse your hair and body thoroughly to remove the shampoo.  Use CHG Soap as you  would any other liquid soap. You can apply CHG directly to the skin and wash gently with a scrungie or a clean washcloth.   Apply the CHG Soap to your body ONLY FROM THE NECK DOWN.  Do not use on open wounds or open sores. Avoid contact with your eyes, ears, mouth and genitals (private parts). Wash Face and genitals (private parts)  with your normal soap.   Wash thoroughly, paying special attention to the area where your surgery will be performed.  Thoroughly rinse your body with warm water from the neck down.  DO NOT shower/wash with your normal soap after using and rinsing off the CHG Soap.  Pat yourself  dry with a CLEAN TOWEL.  Wear CLEAN PAJAMAS to bed the night before surgery  Place CLEAN SHEETS on your bed the night before your surgery  DO NOT SLEEP WITH PETS.   Day of Surgery: Take a shower with CHG soap. Do not wear jewelry or makeup Do not wear lotions, powders, perfumes, or deodorant. Do not shave 48 hours prior to surgery.   Do not bring valuables to the hospital. [facility] is not responsible for any belongings or valuables. Do not wear nail polish, gel polish, artificial nails, or any other type of covering on natural nails (fingers and toes) If you have artificial nails or gel coating that need to be removed by a nail salon, please have this removed prior to surgery. Artificial nails or gel coating may interfere with anesthesia's ability to adequately monitor your vital signs. Wear Clean/Comfortable clothing the morning of surgery Remember to brush your teeth WITH YOUR REGULAR TOOTHPASTE.   Please read over the following fact sheets that you were given.    If you received a COVID test during your pre-op visit  it is requested that you wear a mask when out in public, stay away from anyone that may not be feeling well and notify your surgeon if you develop symptoms. If you have been in contact with anyone that has tested positive in the last 10 days please notify you surgeon.

## 2022-04-21 ENCOUNTER — Other Ambulatory Visit: Payer: Self-pay

## 2022-04-21 ENCOUNTER — Encounter (HOSPITAL_COMMUNITY): Payer: Self-pay

## 2022-04-21 ENCOUNTER — Ambulatory Visit (HOSPITAL_COMMUNITY)
Admission: RE | Admit: 2022-04-21 | Discharge: 2022-04-21 | Disposition: A | Discharge status: Home / Self Care | Payer: Medicare HMO | Source: Ambulatory Visit | Attending: Thoracic Surgery (Cardiothoracic Vascular Surgery) | Admitting: Thoracic Surgery (Cardiothoracic Vascular Surgery)

## 2022-04-21 ENCOUNTER — Encounter (HOSPITAL_COMMUNITY)
Admission: RE | Admit: 2022-04-21 | Discharge: 2022-04-21 | Disposition: A | Discharge status: Home / Self Care | Payer: Medicare HMO | Source: Ambulatory Visit | Attending: Thoracic Surgery (Cardiothoracic Vascular Surgery) | Admitting: Thoracic Surgery (Cardiothoracic Vascular Surgery)

## 2022-04-21 VITALS — BP 169/82 | HR 101 | Temp 97.6°F | Resp 18 | Ht <= 58 in | Wt 109.9 lb

## 2022-04-21 DIAGNOSIS — D4989 Neoplasm of unspecified behavior of other specified sites: Secondary | ICD-10-CM | POA: Insufficient documentation

## 2022-04-21 DIAGNOSIS — Z1152 Encounter for screening for COVID-19: Secondary | ICD-10-CM | POA: Insufficient documentation

## 2022-04-21 DIAGNOSIS — J9859 Other diseases of mediastinum, not elsewhere classified: Secondary | ICD-10-CM | POA: Diagnosis not present

## 2022-04-21 DIAGNOSIS — Z01818 Encounter for other preprocedural examination: Secondary | ICD-10-CM

## 2022-04-21 DIAGNOSIS — I63321 Cerebral infarction due to thrombosis of right anterior cerebral artery: Secondary | ICD-10-CM | POA: Diagnosis not present

## 2022-04-21 DIAGNOSIS — G7000 Myasthenia gravis without (acute) exacerbation: Secondary | ICD-10-CM | POA: Insufficient documentation

## 2022-04-21 HISTORY — DX: Malignant neoplasm of unspecified site of unspecified female breast: C50.919

## 2022-04-21 HISTORY — DX: Myasthenia gravis without (acute) exacerbation: G70.00

## 2022-04-21 HISTORY — DX: Hyperlipidemia, unspecified: E78.5

## 2022-04-21 HISTORY — DX: Essential (primary) hypertension: I10

## 2022-04-21 LAB — COMPREHENSIVE METABOLIC PANEL
ALT: 53 U/L — ABNORMAL HIGH (ref 0–44)
AST: 40 U/L (ref 15–41)
Albumin: 4 g/dL (ref 3.5–5.0)
Alkaline Phosphatase: 75 U/L (ref 38–126)
Anion gap: 12 (ref 5–15)
BUN: 15 mg/dL (ref 8–23)
CO2: 20 mmol/L — ABNORMAL LOW (ref 22–32)
Calcium: 9.3 mg/dL (ref 8.9–10.3)
Chloride: 103 mmol/L (ref 98–111)
Creatinine, Ser: 0.75 mg/dL (ref 0.44–1.00)
GFR, Estimated: >60 mL/min (ref >60)
Glucose, Bld: 88 mg/dL (ref 70–99)
Potassium: 3.6 mmol/L (ref 3.5–5.1)
Sodium: 135 mmol/L (ref 135–145)
Total Bilirubin: 1.5 mg/dL — ABNORMAL HIGH (ref 0.3–1.2)
Total Protein: 6.5 g/dL (ref 6.5–8.1)

## 2022-04-21 LAB — BLOOD GAS, ARTERIAL
Acid-base deficit: 0.3 mmol/L (ref 0.0–2.0)
Bicarbonate: 22.3 mmol/L (ref 20.0–28.0)
Drawn by: 6643
O2 Saturation: 98.1 %
Patient temperature: 37
pCO2 arterial: 30 mmHg — ABNORMAL LOW (ref 32–48)
pH, Arterial: 7.48 — ABNORMAL HIGH (ref 7.35–7.45)
pO2, Arterial: 109 mmHg — ABNORMAL HIGH (ref 83–108)

## 2022-04-21 LAB — CBC
HCT: 44.6 % (ref 36.0–46.0)
Hemoglobin: 14.3 g/dL (ref 12.0–15.0)
MCH: 30.7 pg (ref 26.0–34.0)
MCHC: 32.1 g/dL (ref 30.0–36.0)
MCV: 95.7 fL (ref 80.0–100.0)
Platelets: 282 10*3/uL (ref 150–400)
RBC: 4.66 MIL/uL (ref 3.87–5.11)
RDW: 14.8 % (ref 11.5–15.5)
WBC: 14.9 10*3/uL — ABNORMAL HIGH (ref 4.0–10.5)
nRBC: 0 % (ref 0.0–0.2)

## 2022-04-21 LAB — URINALYSIS, ROUTINE W REFLEX MICROSCOPIC
Bilirubin Urine: NEGATIVE
Glucose, UA: NEGATIVE mg/dL
Hgb urine dipstick: NEGATIVE
Ketones, ur: 20 mg/dL — AB
Leukocytes,Ua: NEGATIVE
Nitrite: NEGATIVE
Protein, ur: NEGATIVE mg/dL
Specific Gravity, Urine: 1.009 (ref 1.005–1.030)
pH: 5 (ref 5.0–8.0)

## 2022-04-21 LAB — PROTIME-INR
INR: 1.1 (ref 0.8–1.2)
Prothrombin Time: 14.2 seconds (ref 11.4–15.2)

## 2022-04-21 LAB — TYPE AND SCREEN
ABO/RH(D): A POS
Antibody Screen: NEGATIVE

## 2022-04-21 LAB — SURGICAL PCR SCREEN
MRSA, PCR: NEGATIVE
Staphylococcus aureus: NEGATIVE

## 2022-04-21 LAB — APTT: aPTT: 28 seconds (ref 24–36)

## 2022-04-21 NOTE — Progress Notes (Addendum)
PCP - Denies Cardiologist -  To  follow up with Dr. Kela Millin Neurologist - Dr. Marcial Pacas  PPM/ICD - Denies  Chest x-ray - today during PAT appointment at 04/21/2022 EKG - 03/29/2022 Stress Test - Denies ECHO - 03/29/2022 Cardiac Cath - Denies  Sleep Study -  Denies  Fasting Blood Sugar -  Non-diabetic  Last dose of GLP1 agonist-  Non-diabetic  Blood Thinner Instructions: Plavix and aspirin. Stop Plavix 04/13/2022 Aspirin Instructions: Hold morning of surgery  ERAS Protcol - No, NPO  COVID TEST- collected today during PAT appointment at 04/21/2022   Anesthesia review: Yes, major cardiothoracic procedure. Myasthenia Gravis, HTN. Recent admission to rule out stroke.   Patient denies shortness of breath, fever, cough and chest pain at PAT appointment   All instructions explained to the patient, with a verbal understanding of the material. Patient agrees to go over the instructions while at home for a better understanding. Patient also instructed to self quarantine after being tested for COVID-19. The opportunity to ask questions was provided.

## 2022-04-22 ENCOUNTER — Encounter (HOSPITAL_COMMUNITY): Payer: Self-pay | Admitting: Thoracic Surgery (Cardiothoracic Vascular Surgery)

## 2022-04-22 LAB — SARS CORONAVIRUS 2 (TAT 6-24 HRS): SARS Coronavirus 2: NEGATIVE

## 2022-04-24 NOTE — Anesthesia Preprocedure Evaluation (Addendum)
Anesthesia Evaluation  Patient identified by MRN, date of birth, ID band Patient awake    Reviewed: Allergy & Precautions, NPO status , Patient's Chart, lab work & pertinent test results  History of Anesthesia Complications Negative for: history of anesthetic complications  Airway Mallampati: III  TM Distance: >3 FB Neck ROM: Full    Dental no notable dental hx. (+) Dental Advisory Given   Pulmonary neg pulmonary ROS   Pulmonary exam normal        Cardiovascular hypertension, Pt. on medications Normal cardiovascular exam(-) Cardiac Defibrillator   IMPRESSIONS     1. Left ventricular ejection fraction, by estimation, is 60 to 65%. The  left ventricle has normal function. The left ventricle has no regional  wall motion abnormalities. There is moderate asymmetric left ventricular  hypertrophy of the basal-septal  segment. Left ventricular diastolic parameters are consistent with Grade I  diastolic dysfunction (impaired relaxation).   2. Right ventricular systolic function is normal. The right ventricular  size is normal. Tricuspid regurgitation signal is inadequate for assessing  PA pressure.   3. The mitral valve is grossly normal. No evidence of mitral valve  regurgitation. No evidence of mitral stenosis.   4. The aortic valve is tricuspid. Aortic valve regurgitation is not  visualized. No aortic stenosis is present.   5. The inferior vena cava is normal in size with greater than 50%  respiratory variability, suggesting right atrial pressure of 3 mmHg.   Conclusion(s)/Recommendation(s): No intracardiac source of embolism  detected on this transthoracic study. Consider a transesophageal  echocardiogram to exclude cardiac source of embolism if clinically  indicated.      Neuro/Psych Myasthenic Syndrome  negative psych ROS   GI/Hepatic negative GI ROS, Neg liver ROS,,,  Endo/Other  negative endocrine ROS     Renal/GU negative Renal ROS     Musculoskeletal negative musculoskeletal ROS (+)    Abdominal   Peds  Hematology negative hematology ROS (+)   Anesthesia Other Findings   Reproductive/Obstetrics                             Anesthesia Physical Anesthesia Plan  ASA: 3  Anesthesia Plan: General   Post-op Pain Management: Tylenol PO (pre-op)* and Toradol IV (intra-op)*   Induction: Intravenous  PONV Risk Score and Plan: 4 or greater and Ondansetron, Dexamethasone and Diphenhydramine  Airway Management Planned: Double Lumen EBT  Additional Equipment: Arterial line and CVP  Intra-op Plan:   Post-operative Plan: Post-operative intubation/ventilation  Informed Consent: I have reviewed the patients History and Physical, chart, labs and discussed the procedure including the risks, benefits and alternatives for the proposed anesthesia with the patient or authorized representative who has indicated his/her understanding and acceptance.     Dental advisory given  Plan Discussed with: Anesthesiologist and CRNA  Anesthesia Plan Comments:        Anesthesia Quick Evaluation

## 2022-04-25 ENCOUNTER — Encounter (HOSPITAL_COMMUNITY): Payer: Self-pay | Admitting: Thoracic Surgery (Cardiothoracic Vascular Surgery)

## 2022-04-25 ENCOUNTER — Inpatient Hospital Stay (HOSPITAL_COMMUNITY): Payer: Medicare HMO | Admitting: Physician Assistant

## 2022-04-25 ENCOUNTER — Encounter (HOSPITAL_COMMUNITY)
Admission: RE | Disposition: A | Discharge status: Home / Self Care | Payer: Self-pay | Source: Home / Self Care | Attending: Thoracic Surgery (Cardiothoracic Vascular Surgery)

## 2022-04-25 ENCOUNTER — Inpatient Hospital Stay (HOSPITAL_COMMUNITY)
Admission: RE | Admit: 2022-04-25 | Discharge: 2022-04-27 | DRG: 827 | Disposition: A | Discharge status: Home / Self Care | Payer: Medicare HMO | Attending: Thoracic Surgery (Cardiothoracic Vascular Surgery) | Admitting: Thoracic Surgery (Cardiothoracic Vascular Surgery)

## 2022-04-25 ENCOUNTER — Other Ambulatory Visit: Payer: Self-pay

## 2022-04-25 ENCOUNTER — Inpatient Hospital Stay (HOSPITAL_COMMUNITY): Payer: Medicare HMO

## 2022-04-25 DIAGNOSIS — Z9089 Acquired absence of other organs: Principal | ICD-10-CM

## 2022-04-25 DIAGNOSIS — D4989 Neoplasm of unspecified behavior of other specified sites: Secondary | ICD-10-CM

## 2022-04-25 DIAGNOSIS — R140 Abdominal distension (gaseous): Secondary | ICD-10-CM | POA: Diagnosis not present

## 2022-04-25 DIAGNOSIS — G7000 Myasthenia gravis without (acute) exacerbation: Secondary | ICD-10-CM | POA: Diagnosis not present

## 2022-04-25 DIAGNOSIS — Z823 Family history of stroke: Secondary | ICD-10-CM

## 2022-04-25 DIAGNOSIS — E785 Hyperlipidemia, unspecified: Secondary | ICD-10-CM | POA: Diagnosis present

## 2022-04-25 DIAGNOSIS — Z7902 Long term (current) use of antithrombotics/antiplatelets: Secondary | ICD-10-CM | POA: Diagnosis not present

## 2022-04-25 DIAGNOSIS — C37 Malignant neoplasm of thymus: Secondary | ICD-10-CM | POA: Diagnosis not present

## 2022-04-25 DIAGNOSIS — I10 Essential (primary) hypertension: Secondary | ICD-10-CM | POA: Diagnosis present

## 2022-04-25 DIAGNOSIS — M810 Age-related osteoporosis without current pathological fracture: Secondary | ICD-10-CM | POA: Diagnosis present

## 2022-04-25 DIAGNOSIS — Z9013 Acquired absence of bilateral breasts and nipples: Secondary | ICD-10-CM

## 2022-04-25 DIAGNOSIS — J939 Pneumothorax, unspecified: Secondary | ICD-10-CM | POA: Diagnosis not present

## 2022-04-25 DIAGNOSIS — Z7982 Long term (current) use of aspirin: Secondary | ICD-10-CM

## 2022-04-25 DIAGNOSIS — J9859 Other diseases of mediastinum, not elsewhere classified: Secondary | ICD-10-CM | POA: Diagnosis not present

## 2022-04-25 DIAGNOSIS — M25511 Pain in right shoulder: Secondary | ICD-10-CM | POA: Diagnosis not present

## 2022-04-25 DIAGNOSIS — I119 Hypertensive heart disease without heart failure: Secondary | ICD-10-CM | POA: Diagnosis not present

## 2022-04-25 DIAGNOSIS — Z79899 Other long term (current) drug therapy: Secondary | ICD-10-CM

## 2022-04-25 DIAGNOSIS — J9811 Atelectasis: Secondary | ICD-10-CM | POA: Diagnosis not present

## 2022-04-25 DIAGNOSIS — D62 Acute posthemorrhagic anemia: Secondary | ICD-10-CM | POA: Diagnosis not present

## 2022-04-25 DIAGNOSIS — Z8249 Family history of ischemic heart disease and other diseases of the circulatory system: Secondary | ICD-10-CM | POA: Diagnosis not present

## 2022-04-25 HISTORY — DX: Acquired absence of other organs: Z90.89

## 2022-04-25 LAB — ABO/RH: ABO/RH(D): A POS

## 2022-04-25 SURGERY — THYMECTOMY, ROBOT-ASSISTED
Anesthesia: General | Laterality: Right

## 2022-04-25 MED ORDER — PHENYLEPHRINE 80 MCG/ML (10ML) SYRINGE FOR IV PUSH (FOR BLOOD PRESSURE SUPPORT)
PREFILLED_SYRINGE | INTRAVENOUS | Status: AC
  Filled 2022-04-25: qty 10

## 2022-04-25 MED ORDER — ROCURONIUM BROMIDE 10 MG/ML (PF) SYRINGE
PREFILLED_SYRINGE | INTRAVENOUS | Status: DC | PRN
  Administered 2022-04-25: 30 mg via INTRAVENOUS
  Administered 2022-04-25: 20 mg via INTRAVENOUS

## 2022-04-25 MED ORDER — LIDOCAINE 2% (20 MG/ML) 5 ML SYRINGE
INTRAMUSCULAR | Status: AC
  Filled 2022-04-25: qty 5

## 2022-04-25 MED ORDER — PROPOFOL 10 MG/ML IV BOLUS
INTRAVENOUS | Status: AC
  Filled 2022-04-25: qty 20

## 2022-04-25 MED ORDER — DEXAMETHASONE SODIUM PHOSPHATE 10 MG/ML IJ SOLN
INTRAMUSCULAR | Status: DC | PRN
  Administered 2022-04-25: 10 mg via INTRAVENOUS

## 2022-04-25 MED ORDER — ORAL CARE MOUTH RINSE: 15.0000 mL | Freq: Once | OROMUCOSAL | Status: AC

## 2022-04-25 MED ORDER — PYRIDOSTIGMINE BROMIDE 60 MG PO TABS
60.0000 mg | ORAL_TABLET | Freq: Three times a day (TID) | ORAL | Status: DC
  Administered 2022-04-25 – 2022-04-27 (×6): 60 mg via ORAL
  Filled 2022-04-25 (×6): qty 1

## 2022-04-25 MED ORDER — FENTANYL CITRATE (PF) 250 MCG/5ML IJ SOLN
INTRAMUSCULAR | Status: DC | PRN
  Administered 2022-04-25 (×3): 50 ug via INTRAVENOUS
  Administered 2022-04-25: 100 ug via INTRAVENOUS

## 2022-04-25 MED ORDER — LIDOCAINE 2% (20 MG/ML) 5 ML SYRINGE
INTRAMUSCULAR | Status: DC | PRN
  Administered 2022-04-25: 60 mg via INTRAVENOUS

## 2022-04-25 MED ORDER — MIDAZOLAM HCL 2 MG/2ML IJ SOLN
INTRAMUSCULAR | Status: DC | PRN
  Administered 2022-04-25 (×2): 1 mg via INTRAVENOUS

## 2022-04-25 MED ORDER — 0.9 % SODIUM CHLORIDE (POUR BTL) OPTIME
TOPICAL | Status: DC | PRN
  Administered 2022-04-25: 2000 mL

## 2022-04-25 MED ORDER — ACETAMINOPHEN 500 MG PO TABS
1000.0000 mg | ORAL_TABLET | Freq: Four times a day (QID) | ORAL | Status: DC
  Administered 2022-04-25 – 2022-04-27 (×7): 1000 mg via ORAL
  Filled 2022-04-25 (×7): qty 2

## 2022-04-25 MED ORDER — ESMOLOL HCL 100 MG/10ML IV SOLN
INTRAVENOUS | Status: DC | PRN
  Administered 2022-04-25: 30 mg via INTRAVENOUS
  Administered 2022-04-25: 20 mg via INTRAVENOUS

## 2022-04-25 MED ORDER — TRAMADOL HCL 50 MG PO TABS
50.0000 mg | ORAL_TABLET | Freq: Four times a day (QID) | ORAL | Status: DC | PRN
  Administered 2022-04-25 – 2022-04-26 (×2): 100 mg via ORAL
  Filled 2022-04-25 (×2): qty 2

## 2022-04-25 MED ORDER — PHENYLEPHRINE 80 MCG/ML (10ML) SYRINGE FOR IV PUSH (FOR BLOOD PRESSURE SUPPORT)
PREFILLED_SYRINGE | INTRAVENOUS | Status: DC | PRN
  Administered 2022-04-25 (×2): 80 ug via INTRAVENOUS

## 2022-04-25 MED ORDER — DEXTROSE-NACL 5-0.45 % IV SOLN: INTRAVENOUS | Status: DC

## 2022-04-25 MED ORDER — SODIUM CHLORIDE 0.9 % IR SOLN
Status: DC | PRN
  Administered 2022-04-25: 1000 mL

## 2022-04-25 MED ORDER — ONDANSETRON HCL 4 MG/2ML IJ SOLN
INTRAMUSCULAR | Status: AC
  Filled 2022-04-25: qty 2

## 2022-04-25 MED ORDER — FENTANYL CITRATE (PF) 250 MCG/5ML IJ SOLN
INTRAMUSCULAR | Status: AC
  Filled 2022-04-25: qty 5

## 2022-04-25 MED ORDER — CHLORHEXIDINE GLUCONATE 0.12 % MT SOLN
15.0000 mL | Freq: Once | OROMUCOSAL | Status: AC
  Administered 2022-04-25: 15 mL via OROMUCOSAL
  Filled 2022-04-25: qty 15

## 2022-04-25 MED ORDER — ONDANSETRON HCL 4 MG/2ML IJ SOLN: 4.0000 mg | Freq: Four times a day (QID) | INTRAMUSCULAR | Status: DC | PRN

## 2022-04-25 MED ORDER — BUPIVACAINE HCL (PF) 0.5 % IJ SOLN
INTRAMUSCULAR | Status: AC
  Filled 2022-04-25: qty 30

## 2022-04-25 MED ORDER — DEXAMETHASONE SODIUM PHOSPHATE 10 MG/ML IJ SOLN
INTRAMUSCULAR | Status: AC
  Filled 2022-04-25: qty 1

## 2022-04-25 MED ORDER — ROCURONIUM BROMIDE 10 MG/ML (PF) SYRINGE
PREFILLED_SYRINGE | INTRAVENOUS | Status: AC
  Filled 2022-04-25: qty 10

## 2022-04-25 MED ORDER — SUGAMMADEX SODIUM 200 MG/2ML IV SOLN
INTRAVENOUS | Status: DC | PRN
  Administered 2022-04-25: 150 mg via INTRAVENOUS

## 2022-04-25 MED ORDER — KETOROLAC TROMETHAMINE 15 MG/ML IJ SOLN
15.0000 mg | Freq: Four times a day (QID) | INTRAMUSCULAR | Status: DC
  Administered 2022-04-25 – 2022-04-27 (×7): 15 mg via INTRAVENOUS
  Filled 2022-04-25 (×7): qty 1

## 2022-04-25 MED ORDER — OXYCODONE HCL 5 MG PO TABS: 5.0000 mg | ORAL_TABLET | ORAL | Status: DC | PRN

## 2022-04-25 MED ORDER — ASPIRIN 81 MG PO TBEC
81.0000 mg | DELAYED_RELEASE_TABLET | Freq: Every day | ORAL | Status: DC
  Administered 2022-04-26 – 2022-04-27 (×2): 81 mg via ORAL
  Filled 2022-04-25 (×2): qty 1

## 2022-04-25 MED ORDER — LISINOPRIL 20 MG PO TABS
20.0000 mg | ORAL_TABLET | Freq: Every day | ORAL | Status: DC
  Administered 2022-04-26 – 2022-04-27 (×2): 20 mg via ORAL
  Filled 2022-04-25 (×2): qty 1

## 2022-04-25 MED ORDER — ORAL CARE MOUTH RINSE: 15.0000 mL | OROMUCOSAL | Status: DC | PRN

## 2022-04-25 MED ORDER — EZETIMIBE 10 MG PO TABS
10.0000 mg | ORAL_TABLET | Freq: Every day | ORAL | Status: DC
  Administered 2022-04-26 – 2022-04-27 (×2): 10 mg via ORAL
  Filled 2022-04-25 (×2): qty 1

## 2022-04-25 MED ORDER — PROMETHAZINE HCL 25 MG/ML IJ SOLN: 6.2500 mg | INTRAMUSCULAR | Status: DC | PRN

## 2022-04-25 MED ORDER — PHENYLEPHRINE HCL-NACL 20-0.9 MG/250ML-% IV SOLN
INTRAVENOUS | Status: DC | PRN
  Administered 2022-04-25: 25 ug/min via INTRAVENOUS

## 2022-04-25 MED ORDER — LACTATED RINGERS IV SOLN: INTRAVENOUS | Status: DC

## 2022-04-25 MED ORDER — FENTANYL CITRATE PF 50 MCG/ML IJ SOSY: 25.0000 ug | PREFILLED_SYRINGE | INTRAMUSCULAR | Status: DC | PRN

## 2022-04-25 MED ORDER — SENNOSIDES-DOCUSATE SODIUM 8.6-50 MG PO TABS
1.0000 | ORAL_TABLET | Freq: Every day | ORAL | Status: DC
  Filled 2022-04-25: qty 1

## 2022-04-25 MED ORDER — LACTATED RINGERS IV SOLN: INTRAVENOUS | Status: DC | PRN

## 2022-04-25 MED ORDER — MIDAZOLAM HCL 2 MG/2ML IJ SOLN
INTRAMUSCULAR | Status: AC
  Filled 2022-04-25: qty 2

## 2022-04-25 MED ORDER — FENTANYL CITRATE (PF) 100 MCG/2ML IJ SOLN
INTRAMUSCULAR | Status: AC
  Filled 2022-04-25: qty 2

## 2022-04-25 MED ORDER — PANTOPRAZOLE SODIUM 40 MG PO TBEC
40.0000 mg | DELAYED_RELEASE_TABLET | Freq: Every day | ORAL | Status: DC
  Administered 2022-04-26 – 2022-04-27 (×2): 40 mg via ORAL
  Filled 2022-04-25 (×2): qty 1

## 2022-04-25 MED ORDER — BUPIVACAINE LIPOSOME 1.3 % IJ SUSP
INTRAMUSCULAR | Status: AC
  Filled 2022-04-25: qty 20

## 2022-04-25 MED ORDER — LUNG SURGERY BOOK
Freq: Once | Status: AC
  Filled 2022-04-25: qty 1

## 2022-04-25 MED ORDER — FENTANYL CITRATE (PF) 100 MCG/2ML IJ SOLN
25.0000 ug | INTRAMUSCULAR | Status: DC | PRN
  Administered 2022-04-25 (×2): 50 ug via INTRAVENOUS

## 2022-04-25 MED ORDER — PROPOFOL 10 MG/ML IV BOLUS
INTRAVENOUS | Status: DC | PRN
  Administered 2022-04-25: 110 mg via INTRAVENOUS

## 2022-04-25 MED ORDER — ONDANSETRON HCL 4 MG/2ML IJ SOLN
INTRAMUSCULAR | Status: DC | PRN
  Administered 2022-04-25: 4 mg via INTRAVENOUS

## 2022-04-25 MED ORDER — ENOXAPARIN SODIUM 40 MG/0.4ML IJ SOSY
40.0000 mg | PREFILLED_SYRINGE | Freq: Every day | INTRAMUSCULAR | Status: DC
  Administered 2022-04-25 – 2022-04-26 (×2): 40 mg via SUBCUTANEOUS
  Filled 2022-04-25 (×2): qty 0.4

## 2022-04-25 MED ORDER — BISACODYL 5 MG PO TBEC
10.0000 mg | DELAYED_RELEASE_TABLET | Freq: Every day | ORAL | Status: DC
  Administered 2022-04-26: 10 mg via ORAL
  Filled 2022-04-25: qty 2

## 2022-04-25 MED ORDER — CHLORHEXIDINE GLUCONATE CLOTH 2 % EX PADS
6.0000 | MEDICATED_PAD | Freq: Every day | CUTANEOUS | Status: DC
  Administered 2022-04-25 – 2022-04-26 (×2): 6 via TOPICAL

## 2022-04-25 MED ORDER — CEFAZOLIN SODIUM-DEXTROSE 2-4 GM/100ML-% IV SOLN
2.0000 g | Freq: Three times a day (TID) | INTRAVENOUS | Status: AC
  Administered 2022-04-25 (×2): 2 g via INTRAVENOUS
  Filled 2022-04-25 (×2): qty 100

## 2022-04-25 MED ORDER — AMISULPRIDE (ANTIEMETIC) 5 MG/2ML IV SOLN: 10.0000 mg | Freq: Once | INTRAVENOUS | Status: DC | PRN

## 2022-04-25 MED ORDER — ACETAMINOPHEN 160 MG/5ML PO SOLN: 1000.0000 mg | Freq: Four times a day (QID) | ORAL | Status: DC

## 2022-04-25 MED ORDER — CEFAZOLIN SODIUM-DEXTROSE 2-4 GM/100ML-% IV SOLN
2.0000 g | INTRAVENOUS | Status: AC
  Administered 2022-04-25: 2 g via INTRAVENOUS
  Filled 2022-04-25: qty 100

## 2022-04-25 MED ORDER — BUPIVACAINE LIPOSOME 1.3 % IJ SUSP
INTRAMUSCULAR | Status: DC | PRN
  Administered 2022-04-25: 10 mL

## 2022-04-25 SURGICAL SUPPLY — 67 items
BLADE STERNUM SYSTEM 6 (BLADE) ×1 IMPLANT
CATH THORACIC 36FR (CATHETERS) IMPLANT
CATH THORACIC 36FR RT ANG (CATHETERS) IMPLANT
CLIP LIGATING HEMO O LOK GREEN (MISCELLANEOUS) ×1 IMPLANT
CLIP TI WIDE RED SMALL 6 (CLIP) ×1 IMPLANT
DEFOGGER SCOPE WARMER CLEARIFY (MISCELLANEOUS) ×2 IMPLANT
DERMABOND ADVANCED .7 DNX12 (GAUZE/BANDAGES/DRESSINGS) ×1 IMPLANT
DRAIN CHANNEL 19F RND (DRAIN) ×1 IMPLANT
DRAIN CONNECTOR BLAKE 1:1 (MISCELLANEOUS) ×1 IMPLANT
DRAPE ARM DVNC X/XI (DISPOSABLE) ×8 IMPLANT
DRAPE COLUMN DVNC XI (DISPOSABLE) ×2 IMPLANT
DRAPE CV SPLIT W-CLR ANES SCRN (DRAPES) ×2 IMPLANT
DRAPE DA VINCI XI ARM (DISPOSABLE) ×8
DRAPE DA VINCI XI COLUMN (DISPOSABLE) ×2
DRAPE ORTHO SPLIT 77X108 STRL (DRAPES) ×2
DRAPE SURG ORHT 6 SPLT 77X108 (DRAPES) ×2 IMPLANT
DRAPE WARM FLUID 44X44 (DRAPES) IMPLANT
DRSG AQUACEL AG ADV 3.5X14 (GAUZE/BANDAGES/DRESSINGS) IMPLANT
ELECT REM PT RETURN 9FT ADLT (ELECTROSURGICAL)
ELECTRODE REM PT RTRN 9FT ADLT (ELECTROSURGICAL) IMPLANT
FELT TEFLON 1X6 (MISCELLANEOUS) IMPLANT
GAUZE 4X4 16PLY ~~LOC~~+RFID DBL (SPONGE) ×1 IMPLANT
GAUZE SPONGE 4X4 12PLY STRL (GAUZE/BANDAGES/DRESSINGS) IMPLANT
GAUZE SPONGE 4X4 12PLY STRL LF (GAUZE/BANDAGES/DRESSINGS) ×1 IMPLANT
GLOVE SS BIOGEL STRL SZ 7.5 (GLOVE) ×4 IMPLANT
GLOVE SURG MICRO LTX SZ7.5 (GLOVE) ×4 IMPLANT
GOWN SPEC L3 XXLG W/TWL (GOWN DISPOSABLE) ×1 IMPLANT
GOWN STRL REUS W/ TWL LRG LVL3 (GOWN DISPOSABLE) ×3 IMPLANT
GOWN STRL REUS W/ TWL XL LVL3 (GOWN DISPOSABLE) ×3 IMPLANT
GOWN STRL REUS W/TWL LRG LVL3 (GOWN DISPOSABLE) ×4
GOWN STRL REUS W/TWL XL LVL3 (GOWN DISPOSABLE) ×4
HEMOSTAT POWDER SURGIFOAM 1G (HEMOSTASIS) IMPLANT
HEMOSTAT SURGICEL 2X14 (HEMOSTASIS) ×3 IMPLANT
IRRIGATION STRYKERFLOW (MISCELLANEOUS) ×2 IMPLANT
IRRIGATOR STRYKERFLOW (MISCELLANEOUS) ×2
KIT SUCTION CATH 14FR (SUCTIONS) IMPLANT
PAD ARMBOARD 7.5X6 YLW CONV (MISCELLANEOUS) ×4 IMPLANT
PAD ELECT DEFIB RADIOL ZOLL (MISCELLANEOUS) IMPLANT
SEAL CANN UNIV 5-8 DVNC XI (MISCELLANEOUS) ×6 IMPLANT
SEAL XI 5MM-8MM UNIVERSAL (MISCELLANEOUS) ×6
SEALER SYNCHRO 8 IS4000 DV (MISCELLANEOUS) ×2
SEALER SYNCHRO 8 IS4000 DVNC (MISCELLANEOUS) ×1 IMPLANT
SHEET MEDIUM DRAPE 40X70 STRL (DRAPES) ×2 IMPLANT
SOL ELECTROSURG ANTI STICK (MISCELLANEOUS) ×2
SOLUTION ELECTROSURG ANTI STCK (MISCELLANEOUS) ×1 IMPLANT
SPONGE T-LAP 18X18 ~~LOC~~+RFID (SPONGE) ×4 IMPLANT
SPONGE T-LAP 4X18 ~~LOC~~+RFID (SPONGE) ×1 IMPLANT
STAPLER CANNULA SEAL DVNC XI (STAPLE) IMPLANT
STAPLER CANNULA SEAL XI (STAPLE)
SUT BONE WAX W31G (SUTURE) IMPLANT
SUT SILK  1 MH (SUTURE) ×2
SUT SILK 1 MH (SUTURE) ×1 IMPLANT
SUT SILK 2 0 SH CR/8 (SUTURE) IMPLANT
SUT STEEL 6MS V (SUTURE) IMPLANT
SUT STEEL SZ 6 DBL 3X14 BALL (SUTURE) IMPLANT
SUT VIC AB 1 CTX 36 (SUTURE) ×2
SUT VIC AB 1 CTX36XBRD ANBCTR (SUTURE) ×1 IMPLANT
SUT VIC AB 2-0 CTX 36 (SUTURE) ×1 IMPLANT
SUT VIC AB 3-0 X1 27 (SUTURE) ×4 IMPLANT
SUT VICRYL 0 UR6 27IN ABS (SUTURE) ×4 IMPLANT
SYSTEM RETRIEVAL ANCHOR 8 (MISCELLANEOUS) ×1 IMPLANT
SYSTEM SAHARA CHEST DRAIN ATS (WOUND CARE) IMPLANT
TAPE CLOTH 4X10 WHT NS (GAUZE/BANDAGES/DRESSINGS) ×1 IMPLANT
TOWEL GREEN STERILE (TOWEL DISPOSABLE) IMPLANT
TOWEL GREEN STERILE FF (TOWEL DISPOSABLE) IMPLANT
TRAY FOLEY SLVR 14FR TEMP STAT (SET/KITS/TRAYS/PACK) ×1 IMPLANT
TRAY FOLEY SLVR 16FR TEMP STAT (SET/KITS/TRAYS/PACK) IMPLANT

## 2022-04-25 NOTE — Brief Op Note (Signed)
04/25/2022  10:05 AM  PATIENT:  Faith Massey  77 y.o. female  PRE-OPERATIVE DIAGNOSIS:  THYMIC TUMOR MYASTHENIA GRAVIS  POST-OPERATIVE DIAGNOSIS:  THYMIC TUMORMYASTHENIA GRAVIS  PROCEDURE:  Procedure(s):  XI ROBOTIC ASSISTED THYMECTOMY (Right)   SURGEON:  Surgeon(s) and Role:    * Melrose Nakayama, MD - Primary  PHYSICIAN ASSISTANT: Ellwood Handler PA-C, Cato Mulligan PA-Student  ASSISTANTS: none   ANESTHESIA:   general  EBL: Per Anesthesia Record  BLOOD ADMINISTERED:none  DRAINS:  19 [address] Right Chest    LOCAL MEDICATIONS USED:  NONE  SPECIMEN:  Source of Specimen:  Thymus  DISPOSITION OF SPECIMEN:  PATHOLOGY  COUNTS:  YES  TOURNIQUET:  * No tourniquets in log *  DICTATION: .Dragon Dictation  PLAN OF CARE: Admit to inpatient   PATIENT DISPOSITION:  PACU - hemodynamically stable.   Delay start of Pharmacological VTE agent (>24hrs) due to surgical blood loss or risk of bleeding: no

## 2022-04-25 NOTE — Op Note (Addendum)
NAMEEARLEN, STANDREW MEDICAL RECORD NO: [id number] ACCOUNT NO: [id number] DATE OF BIRTH: 01-31-1946 FACILITY: [facility] LOCATION: [facility] PHYSICIAN: Revonda Standard. 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  PROCEDURE:  Robotic-assisted right VATS, Thymectomy.  SURGEON:  Revonda Standard. Roxan Hockey, MD  ASSISTANT:  Ellwood Handler, PA, and Garlan Fillers, PA student.  ANESTHESIA:  General.  FINDINGS: Mass consistent with thymoma.  No invasion of surrounding structures.  Both phrenic nerves visualized and intact.  CLINICAL NOTE:  Faith Massey is a 77 year old woman recently diagnosed with myasthenia gravis. As part of her workup she had a CT, which showed an anterior mediastinal mass.  She was referred for consideration for thymectomy.  The indications, risks, benefits, and alternatives were discussed in detail with the patient.  She understood and accepted the risks and agreed to proceed.  OPERATIVE NOTE:  Mrs. Frock was brought to the preoperative holding area on 04/25/2022.  Anesthesia placed an arterial blood pressure monitoring line and established intravenous access.  She was taken to the operating room and anesthetized and intubated with a double lumen endotracheal tube.  Intravenous antibiotics were administered.  A Foley catheter was placed.  Sequential compression devices were placed on the calves for DVT prophylaxis.  She was positioned supine with her right chest slightly  elevated.  A Bair Hugger was placed for active warming.  The chest was prepped and draped in the usual sterile fashion.    A timeout was performed.  A solution containing 20 mL of liposomal bupivacaine, 30 mL of 0.5% bupivacaine was prepared.  This was used for local at the incision sites.  Incision was made in approximately the fifth interspace in the anterior axillary  line, and an 8 mm port was inserted.  Care was taken not to compromise the patient's breast implant.  The thoracoscope was advanced into the chest.  There was good isolation of the right lung.  The mass was clearly visible.  Carbon dioxide was insufflated per  protocol.  Two additional 8 mm robotic ports were placed, one in approximately the third interspace and one in the eighth interspace.  A 12 mm AirSeal port was placed in the seventh interspace posteriorly.  The robot was deployed.  The camera arm was docked, targeting was performed.  The remaining arms were docked.  Robotic instruments were inserted with thoracoscopic visualization.  As noted the mass was clearly visible.  It was a large mass that appeared well encapsulated and grossly appeared to be a thymoma.  The phrenic nerve was identified and preserved throughout the dissection.  Dissection was begun by mobilizing the mass off of the pericardium working from  inferior to superior and from right to left. Medially the pleura was incised and ultimately the left pleural space was entered.  This allowed visualization of the left phrenic nerve, which was preserved as well.  Dissection was carried superiorly and the  right superior pole of the thymus was mobilized.  The feeding vessels to that area were divided with the SynchroSeal device which was also used to divide several large vein branches from the innominate to the thymus and then the left superior pole of the thymus was mobilized. As the thymus was removed on the left side care was taken to avoid the phrenic nerve.  There was a small group of lymph nodes adjacent to the thymus.  These were removed and sent  as a separate specimen.  Once the thymus had been  completely mobilized, an 8 mm endoscopic retrieval bag was placed into the chest.  The specimen was placed into the bag.  Final inspection was made for hemostasis.  The robotic instruments were removed.  The robot was undocked.  The specimen was  removed through the AirSeal port incision and marked and sent for permanent pathology. Grossly the specimen was completely encapsulated.  A 19-French Blake drain was placed through the eighth interspace incision and directed across the right side into the left pleural space and secured with a #1 silk suture.  Dual lung ventilation was resumed.  The incisions were closed in standard fashion.  The patient was extubated in the operating room and taken to the postanesthetic care unit in good condition.  Experience assistance was necessary for this case due to surgical complexity.  Ellwood Handler, PA served as the Environmental consultant providing assistance with robot docking and undocking, instrument exchange, suctioning, specimen retrieval, and wound closure.  PUS D: 04/25/2022 6:02:45 pm T: 04/25/2022 7:00:00 pm  JOB: [id number]

## 2022-04-25 NOTE — Hospital Course (Addendum)
History of Present Illness:  Faith Massey is a 77 year old woman with a past history significant for hypertension, hyperlipidemia, breast cancer (1990) and osteoporosis.  She presented with right eye ptosis and hoarseness progressing to near complete aphonia.  MRI showed a possible small subacute infarct in the right frontal lobe.  She was transferred to [facility] for stroke workup.  She was started on aspirin and Plavix.  As part of that workup she had a CT of the head and neck which showed an anterior mediastinal mass.  Further workup revealed marked elevation of the acetylcholine receptor antibodies confirming the diagnosis of myasthenia gravis.  Her symptoms improved and she was discharged.  She recently saw Dr. Krista Blue of neurology.  She gave her prescription for pyridostigmine.  She has not started that prescription yet.  Her Plavix was discontinued.   She has noticed that she gets short of breath when walking up inclines that she used to be able to do easily.  No further phonation issues.  No double vision but has a little slight droop in her right eyelid.  The patient was evaluated by Dr. Roxan Hockey who recommended Robotic Assisted VATS for Thymectomy with possible sternotomy.  The risks and benefits of the procedure were explained to the patient and she was agreeable to proceed.  Hospital Course:  [name] presented to [facility] on 04/25/2022.  She was taken to the operating room and underwent Robotic Assisted Right Video Thoracoscopy with Resection of Thymoma.  She tolerated the procedure without difficulty, was extubated, and taken to the PACU in stable condition.

## 2022-04-25 NOTE — Progress Notes (Signed)
You see her for MG. She had stroke in Jan 2024, small infarct and suspect thrombotic stroke, no AF on 14 day monitor, just [facility]

## 2022-04-25 NOTE — Anesthesia Procedure Notes (Signed)
Central Venous Catheter Insertion Performed by: Duane Boston, MD, anesthesiologist Start/End2/07/2022 7:01 AM, 04/25/2022 8:11 AM Patient location: Pre-op. Preanesthetic checklist: patient identified, IV checked, site marked, risks and benefits discussed, surgical consent, monitors and equipment checked, pre-op evaluation, timeout performed and anesthesia consent Position: Trendelenburg Lidocaine 1% used for infiltration and patient sedated Hand hygiene performed , maximum sterile barriers used  and Seldinger technique used Catheter size: 8 Fr Total catheter length 16. Central line was placed.Double lumen Procedure performed using ultrasound guided technique. Ultrasound Notes:anatomy identified, needle tip was noted to be adjacent to the nerve/plexus identified, no ultrasound evidence of intravascular and/or intraneural injection and image(s) printed for medical record Attempts: 1 Following insertion, dressing applied, line sutured and Biopatch. Post procedure assessment: blood return through all ports, free fluid flow and no air  Patient tolerated the procedure well with no immediate complications.

## 2022-04-25 NOTE — Interval H&P Note (Signed)
History and Physical Interval Note:  04/25/2022 7:20 AM  Faith Massey  has presented today for surgery, with the diagnosis of THYMIC TUMOR MYASTHENIA GRAVIS.  The various methods of treatment have been discussed with the patient and family. After consideration of risks, benefits and other options for treatment, the patient has consented to  Procedure(s): XI ROBOTIC ASSISTED THYMECTOMY (Right) possible STERNOTOMY (N/A) as a surgical intervention.  The patient's history has been reviewed, patient examined, no change in status, stable for surgery.  I have reviewed the patient's chart and labs.  Questions were answered to the patient's satisfaction.     Melrose Nakayama

## 2022-04-25 NOTE — Anesthesia Postprocedure Evaluation (Signed)
Anesthesia Post Note  Patient: Faith Massey  Procedure(s) Performed: XI ROBOTIC ASSISTED THYMECTOMY (Right)     Patient location during evaluation: PACU Anesthesia Type: General Level of consciousness: sedated Pain management: pain level controlled Vital Signs Assessment: post-procedure vital signs reviewed and stable Respiratory status: spontaneous breathing and respiratory function stable Cardiovascular status: stable Postop Assessment: no apparent nausea or vomiting Anesthetic complications: no   No notable events documented.  Last Vitals:  Vitals:   04/25/22 1100 04/25/22 1122  BP: (!) 149/73 (!) 158/72  Pulse: 72 74  Resp: 15 19  Temp: (!) 36.4 C 36.4 C  SpO2: 94% 93%    Last Pain:  Vitals:   04/25/22 1122  TempSrc: Oral  PainSc: 0-No pain                 Caylynn Minchew DANIEL

## 2022-04-25 NOTE — Discharge Summary (Addendum)
[address]       [place] [address]             [phone]    Physician Discharge Summary  Patient ID: Faith Massey MRN: [id number] DOB/AGE: 1945-09-12 77 y.o.  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   Myasthenic syndrome ([place]) 04/12/2022   CVA (cerebral vascular accident) ([place]) 03/28/2022   HTN (hypertension) 03/28/2022   Chest mass 03/28/2022   Right knee injury 02/14/2017   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 10/16/2012   Osteoporosis 10/12/2012     Discharge Diagnoses:  Patient Active Problem List   Diagnosis Date Noted   S/P Robotic Assisted Right Video Thoracoscopy with resection of Thymus 04/25/2022   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  She presented with right eye ptosis and hoarseness progressing to near complete aphonia.  MRI showed a possible small subacute infarct in the right frontal lobe.  She was transferred to [facility] for stroke workup.  She was started on aspirin and Plavix.  As part of that workup she had a CT of the head and neck which showed an anterior mediastinal mass.  Further workup revealed marked elevation of the acetylcholine receptor antibodies confirming the diagnosis of myasthenia gravis.  Her symptoms improved and she was discharged.  She recently  saw Dr. Krista Blue of neurology.  She gave her prescription for pyridostigmine.  She has not started that prescription yet.  Her Plavix was discontinued.   She has noticed that she gets short of breath when walking up inclines that she used to be able to do easily.  No further phonation issues.  No double vision but has a little slight droop in her right eyelid.  The patient was evaluated by Dr. Roxan Hockey who recommended Robotic Assisted VATS for Thymectomy with possible sternotomy.  The risks and benefits of the procedure were explained to the patient and she was agreeable to proceed.  Hospital Course:  Faith Massey presented to [facility] on 04/25/2022.  She was taken to the operating room and underwent Robotic Assisted Right Video Thoracoscopy with Resection of Thymoma.  She tolerated the procedure without difficulty, was extubated, and taken to the PACU in stable condition. A line and foley were removed early in her post operative course.  Chest tube was to water seal and there was no air leak. CXR was stable. Chest tube was removed on POD 1. Same day PA/LAT CXR showed no pneumothorax. PA/LAT CXR 02/07 showed stable, minimal left basilar subsegmental atelectasis with a small left pleural effusion and no pneumothorax. Central line was removed on 02/06. All wounds are clean and dry and healing without signs of infection. She is tolerating a diet. Stool softener did give her diarrhea so were stopped. She is ambulating on room air with good oxygenation. Her pain is well controlled. She is felt  surgically stable for discharge today.  Consults: None  Significant Diagnostic Studies:   Narrative & Impression  CLINICAL DATA:  History of thymectomy.   EXAM: PORTABLE CHEST 1 VIEW   COMPARISON:  Chest x-ray 04/25/2022, 04/21/2022. CT chest 03/28/2022.   FINDINGS: Right IJ line and chest tube in unchanged position. Tip of chest tube is midline. Mediastinum appears unchanged. Heart size stable. Small  right apical lucency is again noted as previously described. No interim change. As noted on prior exam continued close follow-up exams suggested to exclude tiny pneumothorax. Mild bibasilar atelectasis. No pleural effusion. Degenerative changes and scoliosis thoracic spine.   IMPRESSION: 1. Right IJ line chest tube in unchanged position. Tip of chest tube is midline. Mediastinum appears unchanged.   2. Small right apical lucency is again noted as previously described. No interim change. As noted on prior exam continued close follow-up exams suggested to exclude tiny right apical pneumothorax.   3.  Mild bibasilar atelectasis.     Electronically Signed   By: Marcello Moores  Register M.D.   On: 04/26/2022 06:26     Narrative & Impression  CLINICAL DATA:  Pneumothorax.   EXAM: CHEST - 2 VIEW   COMPARISON:  April 26, 2022.   FINDINGS: The heart size and mediastinal contours are within normal limits. Right lung is clear. Minimal left basilar subsegmental atelectasis is noted with small left pleural effusion. No pneumothorax is noted. The visualized skeletal structures are unremarkable.   IMPRESSION: Stable minimal left basilar subsegmental atelectasis with small left pleural effusion.     Electronically Signed   By: Marijo Conception M.D.   On: 04/27/2022 08:42      Treatments:  Robotic-assisted right VATS thymectomy by Dr. Roxan Hockey on 04/25/2022.  Pathology: Final pathology result pending.  Discharge Exam: Blood pressure (!) 144/60, pulse 68, temperature 98.5 F (36.9 C), temperature source Oral, resp. rate 16, height 4' 10"$  (1.473 m), weight 49.9 kg, SpO2 94 %. Cardiovascular: RRR, Pulmonary: Clear to auscultation bilaterally Abdomen: Soft, non tender, bowel sounds present. Extremities: No LE edema Wounds: Clean and dry.  No erythema or signs of infection. No drainage from chest tube wound.   Discharge Medications:  Allergies as of 04/27/2022   No Known  Allergies      Medication List     TAKE these medications    aspirin EC 81 MG tablet Take 1 tablet (81 mg total) by mouth daily. Swallow whole.   COLLAGEN PO Take 1 Scoop by mouth daily.   ezetimibe 10 MG tablet Commonly known as: ZETIA Take 1 tablet (10 mg total) by mouth daily.   lisinopril 20 MG tablet Commonly known as: ZESTRIL Take 1 tablet (20 mg total) by mouth daily.   multivitamin with minerals tablet Take 1 tablet by mouth daily.   pyridostigmine 60 MG tablet Commonly known as: Mestinon Take 1 tablet (60 mg total) by mouth 3 (three) times daily.   traMADol 50 MG tablet Commonly known as: ULTRAM Take 1 tablet (50 mg total) by mouth every 6 (six) hours as needed (mild pain).   Vitamin D-3 125 MCG (5000 UT) Tabs Take 5,000 Units by mouth daily.        Follow-up Information     [facility] Follow up.   Why: PA/LAT CXR to be taken on 02/20. Please arrive by 8:30 am Contact information: [place]        Melrose Nakayama, MD. Go on 05/10/2022.   Specialty:  Cardiothoracic Surgery Why: Appointment is on 02/20 at 9:45 am Contact information: [address] [place] [address] [phone]                 Signed:  Arnoldo Lenis 04/27/2022, 8:51 AM

## 2022-04-25 NOTE — Anesthesia Procedure Notes (Signed)
Procedure Name: Intubation Date/Time: 04/25/2022 7:45 AM  Performed by: Carolan Clines, CRNAPre-anesthesia Checklist: Patient identified, Emergency Drugs available, Suction available and Patient being monitored Patient Re-evaluated:Patient Re-evaluated prior to induction Oxygen Delivery Method: Circle System Utilized Preoxygenation: Pre-oxygenation with 100% oxygen Induction Type: IV induction Ventilation: Mask ventilation without difficulty Laryngoscope Size: Mac and 3 Grade View: Grade I Tube type: Oral Endobronchial tube: Left and Double lumen EBT and 35 Fr Number of attempts: 1 Airway Equipment and Method: Stylet and Oral airway Placement Confirmation: ETT inserted through vocal cords under direct vision, positive ETCO2 and breath sounds checked- equal and bilateral Tube secured with: Tape Dental Injury: Teeth and Oropharynx as per pre-operative assessment  Comments: 35 Fr VivaSight DLT placed without difficulty.

## 2022-04-25 NOTE — Anesthesia Procedure Notes (Signed)
Arterial Line Insertion Start/End2/07/2022 7:05 AM Performed by: Wilburn Cornelia, CRNA, CRNA  Patient location: Pre-op. Preanesthetic checklist: patient identified, IV checked, site marked, risks and benefits discussed, surgical consent, monitors and equipment checked, pre-op evaluation, timeout performed and anesthesia consent Lidocaine 1% used for infiltration Left, radial was placed Catheter size: 20 G Hand hygiene performed  and maximum sterile barriers used   Attempts: 2 Procedure performed without using ultrasound guided technique. Following insertion, dressing applied and Biopatch. Post procedure assessment: normal and unchanged  Patient tolerated the procedure well with no immediate complications.

## 2022-04-25 NOTE — Transfer of Care (Signed)
Immediate Anesthesia Transfer of Care Note  Patient: Faith Massey  Procedure(s) Performed: XI ROBOTIC ASSISTED THYMECTOMY (Right)  Patient Location: General  Anesthesia Type:General  Level of Consciousness: drowsy  Airway & Oxygen Therapy: Patient Spontanous Breathing  Post-op Assessment: Report given to RN and Post -op Vital signs reviewed and stable  Post vital signs: Reviewed and stable  Last Vitals:  Vitals Value Taken Time  BP 160/82 04/25/22 1017  Temp    Pulse 76 04/25/22 1022  Resp    SpO2 92 % 04/25/22 1022  Vitals shown include unvalidated device data.  Last Pain:  Vitals:   04/25/22 0622  PainSc: 0-No pain      Patients Stated Pain Goal: 0 (88/91/69 4503)  Complications: No notable events documented.

## 2022-04-26 ENCOUNTER — Inpatient Hospital Stay (HOSPITAL_COMMUNITY): Payer: Medicare HMO

## 2022-04-26 LAB — CBC
HCT: 35.4 % — ABNORMAL LOW (ref 36.0–46.0)
Hemoglobin: 11.9 g/dL — ABNORMAL LOW (ref 12.0–15.0)
MCH: 32.2 pg (ref 26.0–34.0)
MCHC: 33.6 g/dL (ref 30.0–36.0)
MCV: 95.9 fL (ref 80.0–100.0)
Platelets: 201 10*3/uL (ref 150–400)
RBC: 3.69 MIL/uL — ABNORMAL LOW (ref 3.87–5.11)
RDW: 14.7 % (ref 11.5–15.5)
WBC: 16.8 10*3/uL — ABNORMAL HIGH (ref 4.0–10.5)
nRBC: 0 % (ref 0.0–0.2)

## 2022-04-26 LAB — BASIC METABOLIC PANEL
Anion gap: 7 (ref 5–15)
BUN: 9 mg/dL (ref 8–23)
CO2: 23 mmol/L (ref 22–32)
Calcium: 8.3 mg/dL — ABNORMAL LOW (ref 8.9–10.3)
Chloride: 107 mmol/L (ref 98–111)
Creatinine, Ser: 0.63 mg/dL (ref 0.44–1.00)
GFR, Estimated: >60 mL/min (ref >60)
Glucose, Bld: 137 mg/dL — ABNORMAL HIGH (ref 70–99)
Potassium: 3.7 mmol/L (ref 3.5–5.1)
Sodium: 137 mmol/L (ref 135–145)

## 2022-04-26 MED ORDER — POTASSIUM CHLORIDE CRYS ER 20 MEQ PO TBCR
30.0000 meq | EXTENDED_RELEASE_TABLET | Freq: Once | ORAL | Status: AC
  Administered 2022-04-26: 30 meq via ORAL
  Filled 2022-04-26: qty 1

## 2022-04-26 MED ORDER — LOPERAMIDE HCL 2 MG PO CAPS: 2.0000 mg | ORAL_CAPSULE | ORAL | Status: DC | PRN

## 2022-04-26 NOTE — Progress Notes (Signed)
   04/26/22 1039  Mobility  Activity Ambulated with assistance in hallway  Level of Assistance Standby assist, set-up cues, supervision of patient - no hands on  Assistive Device None  Distance Ambulated (ft) 450 ft  Activity Response Tolerated well  $Mobility charge 1 Mobility   During Mobility:86 HR Post Mobility:   81 HR; 94% SpO2  Pt in bed willing to participate in mobility. Left in bed with call bell in reach and all needs met.   Gareth Eagle Jasmeet Manton Mobility Specialist Please contact via [facility] or  Rehab Office at [phone]

## 2022-04-26 NOTE — TOC Initial Note (Signed)
Transition of Care [facility]) - Initial/Assessment Note    Patient Details  Name: Faith Massey MRN: [id number] Date of Birth: 11/15/1945  Transition of [facility]) CM/SW Contact:    Cyndi Bender, RN Phone Number: 04/26/2022, 3:20 PM  Clinical Narrative:                 Spoke to patient regarding transition needs.  Patient lives alone and has children support.  Patient requesting to find her own PCP. Children can help with transportation.  TOC will continue to follow.   Expected Discharge Plan: Home/Self Care Barriers to Discharge: Continued Medical Work up   Patient Goals and CMS Choice Patient states their goals for this hospitalization and ongoing recovery are:: return home          Expected Discharge Plan and Services       Living arrangements for the past 2 months: Single Family Home                                      Prior Living Arrangements/Services Living arrangements for the past 2 months: Single Family Home Lives with:: Self Patient language and need for interpreter reviewed:: Yes Do you feel safe going back to the place where you live?: Yes      Need for Family Participation in Patient Care: Yes (Comment)     Criminal Activity/Legal Involvement Pertinent to Current Situation/Hospitalization: No - Comment as needed  Activities of Daily Living Home Assistive Devices/Equipment: Eyeglasses ADL Screening (condition at time of admission) Patient's cognitive ability adequate to safely complete daily activities?: Yes Is the patient deaf or have difficulty hearing?: No Does the patient have difficulty seeing, even when wearing glasses/contacts?: No Does the patient have difficulty concentrating, remembering, or making decisions?: No Patient able to express need for assistance with ADLs?: Yes Does the patient have difficulty dressing or bathing?: No Independently performs ADLs?: Yes (appropriate for developmental age) Does the patient have difficulty  walking or climbing stairs?: Yes Weakness of Legs: None Weakness of Arms/Hands: None  Permission Sought/Granted                  Emotional Assessment Appearance:: Appears younger than stated age Attitude/Demeanor/Rapport: Gracious Affect (typically observed): Accepting Orientation: : Oriented to Self, Oriented to Place, Oriented to  Time, Oriented to Situation Alcohol / Substance Use: Not Applicable Psych Involvement: No (comment)  Admission diagnosis:  S/P thymectomy [Z90.89] Patient Active Problem List   Diagnosis Date Noted   S/P Robotic Assisted Right Video Thoracoscopy with resection of Thymus 04/25/2022   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 [place] [address] Phone: [phone] Fax: [phone]     Social Determinants of Health (SDOH) Social History: SDOH Screenings  Food Insecurity: No Food Insecurity (04/26/2022)  Housing: Low Risk  (04/26/2022)  Transportation Needs: No Transportation Needs (04/26/2022)  Utilities: Not At Risk (04/26/2022)  Tobacco Use: Low Risk  (04/25/2022)   SDOH Interventions:     Readmission Risk Interventions     No data to display

## 2022-04-26 NOTE — Progress Notes (Addendum)
      [address]       [place] [address]             [phone]       1 Day Post-Op Procedure(s) (LRB): XI ROBOTIC ASSISTED THYMECTOMY (Right)  Subjective: Patient eating breakfast, daughter at bedside. She states pain oi not too bad .  Objective: Vital signs in last 24 hours: Temp:  [97.2 F (36.2 C)-98.4 F (36.9 C)] 98.1 F (36.7 C) (02/06 0319) Pulse Rate:  [54-94] 54 (02/06 0319) Cardiac Rhythm: Sinus bradycardia (02/06 0700) Resp:  [12-22] 15 (02/06 0319) BP: (97-160)/(43-88) 97/53 (02/06 0319) SpO2:  [90 %-98 %] 94 % (02/06 0319)      Intake/Output from previous day: 02/05 0701 - 02/06 0700 In: 2339.8 [P.O.:480; I.V.:1659.8; IV Piggyback:200] Out: 8979 [Urine:3325; Blood:20; Chest Tube:130]   Physical Exam:  Cardiovascular: RRR, Pulmonary: Clear to auscultation bilaterally Abdomen: Soft, non tender, bowel sounds present. Extremities: SCDS in place Wounds: Clean and dry.  No erythema or signs of infection. Sero sanguinous drainage around chest tube Chest Tube: to waster seal, no air leak  Lab Results: CBC: Recent Labs    04/26/22 0516  WBC 16.8*  HGB 11.9*  HCT 35.4*  PLT 201   BMET:  Recent Labs    04/26/22 0516  NA 137  K 3.7  CL 107  CO2 23  GLUCOSE 137*  BUN 9  CREATININE 0.63  CALCIUM 8.3*    PT/INR: No results for input(s): "LABPROT", "INR" in the last 72 hours. ABG:  INR: Will add last result for INR, ABG once components are confirmed Will add last 4 CBG results once components are confirmed  Assessment/Plan:  1. CV - SR. Will restart Lisinopril at discharge 2.  Pulmonary - On room air. Chest tube with 130 cc of output since surgery. Chest tube is to water seal, no air leak. CXR this am is stable. Remove chest tube.  Encourage incentive spirometer. Await final pathology 3. Supplement potassium 4. Expected post op blood loss anemia-H and H this am 11.9 and 35.4 5. On Lovenox for DVT prophylaxis 6. Remove  foley, heplock IVF 7. History of myasthenia gravis-continue Mestinon 8. Likely discharge in am  Sharalyn Ink [facility] 04/26/2022,7:18 AM  Patient seen and examined, agree with above Dc chest tube Ambulate  Remo Lipps C. Roxan Hockey, MD [facility] [phone]

## 2022-04-27 ENCOUNTER — Inpatient Hospital Stay (HOSPITAL_COMMUNITY): Payer: Medicare HMO

## 2022-04-27 ENCOUNTER — Other Ambulatory Visit (HOSPITAL_COMMUNITY): Payer: Self-pay

## 2022-04-27 DIAGNOSIS — J939 Pneumothorax, unspecified: Secondary | ICD-10-CM | POA: Diagnosis not present

## 2022-04-27 DIAGNOSIS — J9811 Atelectasis: Secondary | ICD-10-CM | POA: Diagnosis not present

## 2022-04-27 DIAGNOSIS — J90 Pleural effusion, not elsewhere classified: Secondary | ICD-10-CM | POA: Diagnosis not present

## 2022-04-27 LAB — CBC
HCT: 36.2 % (ref 36.0–46.0)
Hemoglobin: 11.9 g/dL — ABNORMAL LOW (ref 12.0–15.0)
MCH: 31.6 pg (ref 26.0–34.0)
MCHC: 32.9 g/dL (ref 30.0–36.0)
MCV: 96.3 fL (ref 80.0–100.0)
Platelets: 202 10*3/uL (ref 150–400)
RBC: 3.76 MIL/uL — ABNORMAL LOW (ref 3.87–5.11)
RDW: 14.9 % (ref 11.5–15.5)
WBC: 12.5 10*3/uL — ABNORMAL HIGH (ref 4.0–10.5)
nRBC: 0 % (ref 0.0–0.2)

## 2022-04-27 LAB — COMPREHENSIVE METABOLIC PANEL
ALT: 30 U/L (ref 0–44)
AST: 24 U/L (ref 15–41)
Albumin: 2.7 g/dL — ABNORMAL LOW (ref 3.5–5.0)
Alkaline Phosphatase: 51 U/L (ref 38–126)
Anion gap: 7 (ref 5–15)
BUN: 12 mg/dL (ref 8–23)
CO2: 21 mmol/L — ABNORMAL LOW (ref 22–32)
Calcium: 8.3 mg/dL — ABNORMAL LOW (ref 8.9–10.3)
Chloride: 112 mmol/L — ABNORMAL HIGH (ref 98–111)
Creatinine, Ser: 0.69 mg/dL (ref 0.44–1.00)
GFR, Estimated: >60 mL/min (ref >60)
Glucose, Bld: 108 mg/dL — ABNORMAL HIGH (ref 70–99)
Potassium: 3.8 mmol/L (ref 3.5–5.1)
Sodium: 140 mmol/L (ref 135–145)
Total Bilirubin: 0.4 mg/dL (ref 0.3–1.2)
Total Protein: 4.9 g/dL — ABNORMAL LOW (ref 6.5–8.1)

## 2022-04-27 MED ORDER — POTASSIUM CHLORIDE CRYS ER 20 MEQ PO TBCR
30.0000 meq | EXTENDED_RELEASE_TABLET | Freq: Once | ORAL | Status: AC
  Administered 2022-04-27: 30 meq via ORAL
  Filled 2022-04-27: qty 1

## 2022-04-27 MED ORDER — TRAMADOL HCL 50 MG PO TABS
50.0000 mg | ORAL_TABLET | Freq: Four times a day (QID) | ORAL | 0 refills | Status: DC | PRN
  Filled 2022-04-27: qty 28, 7d supply, fill #0

## 2022-04-27 NOTE — Progress Notes (Signed)
Went over discharge paper work with patient. All questions answered. PIV and telemetry removed. All belongings at bedside.

## 2022-04-27 NOTE — Discharge Instructions (Signed)
Robot-Assisted Thoracic Surgery, Care After The following information offers guidance on how to care for yourself after your procedure. Your health care provider may also give you more specific instructions. If you have problems or questions, contact your health care provider. What can I expect after the procedure? After the procedure, it is common to have: Some pain and aches in the area of your surgical incisions. Pain when breathing in (inhaling) and coughing. Tiredness (fatigue). Trouble sleeping. Constipation. Follow these instructions at home: Medicines Take over-the-counter and prescription medicines only as told by your health care provider. If you were prescribed an antibiotic medicine, take it as told by your health care provider. Do not stop taking the antibiotic even if you start to feel better. Talk with your health care provider about safe and effective ways to manage pain after your procedure. Pain management should fit your specific health needs. Take pain medicine before pain becomes severe. Relieving and controlling your pain will make breathing easier for you. Ask your health care provider if the medicine prescribed to you requires you to avoid driving or using machinery. Eating and drinking Follow instructions from your health care provider about eating or drinking restrictions. These will vary depending on what procedure you had. Your health care provider may recommend: A liquid diet or soft diet for the first few days. Meals that are smaller and more frequent. A diet of fruits, vegetables, whole grains, and low-fat proteins. Limiting foods that are high in fat and processed sugar, including fried or sweet foods. Incision care Follow instructions from your health care provider about how to take care of your incisions. Make sure you: Wash your hands with soap and water for at least 20 seconds before and after you change your bandage (dressing). If soap and water are not  available, use hand sanitizer. Change your dressing as told by your health care provider. Leave stitches (sutures), skin glue, or adhesive strips in place. These skin closures may need to stay in place for 2 weeks or longer. If adhesive strip edges start to loosen and curl up, you may trim the loose edges. Do not remove adhesive strips completely unless your health care provider tells you to do that. Check your incision area every day for signs of infection. Check for: Redness, swelling, or more pain. Fluid or blood. Warmth. Pus or a bad smell. Activity Return to your normal activities as told by your health care provider. Ask your health care provider what activities are safe for you. Ask your health care provider when it is safe for you to drive. Do not lift anything that is heavier than 10 lb (4.5 kg), or the limit that you are told, until your health care provider says that it is safe. Rest as told by your health care provider. Avoid sitting for a long time without moving. Get up to take short walks every 1-2 hours. This is important to improve blood flow and breathing. Ask for help if you feel weak or unsteady. Do exercises as told by your health care provider. Pneumonia prevention  Do deep breathing exercises and cough regularly as directed. This helps clear mucus and opens your lungs. Doing this helps prevent lung infection (pneumonia). If you were given an incentive spirometer, use it as told. An incentive spirometer is a tool that measures how well you are filling your lungs with each breath. Coughing may hurt less if you try to support your chest. This is called splinting. Try one of these when you  cough: Hold a pillow against your chest. Place the palms of both hands on top of your incision area. Do not use any products that contain nicotine or tobacco. These products include cigarettes, chewing tobacco, and vaping devices, such as e-cigarettes. If you need help quitting, ask your  health care provider. Avoid secondhand smoke. General instructions If you have a drainage tube: Follow instructions from your health care provider about how to take care of it. Do not travel by airplane after your tube is removed until your health care provider tells you it is safe. You may need to take these actions to prevent or treat constipation: Drink enough fluid to keep your urine pale yellow. Take over-the-counter or prescription medicines. Eat foods that are high in fiber, such as beans, whole grains, and fresh fruits and vegetables. Limit foods that are high in fat and processed sugars, such as fried or sweet foods. Keep all follow-up visits. This is important. Contact a health care provider if: You have redness, swelling, or more pain around an incision. You have fluid or blood coming from an incision. An incision feels warm to the touch. You have pus or a bad smell coming from an incision. You have a fever. You cannot eat or drink without vomiting. Your pain medicine is not controlling your pain. Get help right away if: You have chest pain. Your heart is beating quickly. You have trouble breathing. You have trouble speaking. You are confused. You feel weak or dizzy, or you faint. These symptoms may represent a serious problem that is an emergency. Do not wait to see if the symptoms will go away. Get medical help right away. Call your local emergency services (911 in the [place] Do not drive yourself to the hospital. Summary Talk with your health care provider about safe and effective ways to manage pain after your procedure. Pain management should fit your specific health needs. Return to your normal activities as told by your health care provider. Ask your health care provider what activities are safe for you. Do deep breathing exercises and cough regularly as directed. This helps to clear mucus and prevent pneumonia. If it hurts to cough, ease pain by holding a pillow  against your chest or by placing the palms of both hands over your incisions. This information is not intended to replace advice given to you by your health care provider. Make sure you discuss any questions you have with your health care provider. Document Revised: 11/29/2019 Document Reviewed: 11/29/2019 Elsevier Patient Education  [place].

## 2022-04-27 NOTE — Progress Notes (Signed)
      [address]       [place] [address]             [phone]       2 Days Post-Op Procedure(s) (LRB): XI ROBOTIC ASSISTED THYMECTOMY (Right)  Subjective: Patient states stool softener yesterday "messed her up". She does not like the food;no nausea. She ambulated yesterday. Hot pack helped with right posterior shoulder pain  Objective: Vital signs in last 24 hours: Temp:  [97.8 F (36.6 C)-98.5 F (36.9 C)] 98.5 F (36.9 C) (02/07 0350) Pulse Rate:  [58-71] 68 (02/07 0350) Cardiac Rhythm: Normal sinus rhythm (02/06 1901) Resp:  [14-20] 16 (02/07 0350) BP: (110-148)/(55-73) 144/60 (02/07 0350) SpO2:  [92 %-97 %] 94 % (02/07 0350)      Intake/Output from previous day: 02/06 0701 - 02/07 0700 In: 741.9 [P.O.:360; I.V.:381.9] Out: 150 [Urine:150]   Physical Exam:  Cardiovascular: RRR, Pulmonary: Clear to auscultation bilaterally Abdomen: Soft, non tender, bowel sounds present. Extremities: No LE edema Wounds: Clean and dry.  No erythema or signs of infection. No drainage from chest tube wound.   Lab Results: CBC: Recent Labs    04/26/22 0516 04/27/22 0020  WBC 16.8* 12.5*  HGB 11.9* 11.9*  HCT 35.4* 36.2  PLT 201 202    BMET:  Recent Labs    04/26/22 0516 04/27/22 0020  NA 137 140  K 3.7 3.8  CL 107 112*  CO2 23 21*  GLUCOSE 137* 108*  BUN 9 12  CREATININE 0.63 0.69  CALCIUM 8.3* 8.3*     PT/INR: No results for input(s): "LABPROT", "INR" in the last 72 hours. ABG:  INR: Will add last result for INR, ABG once components are confirmed Will add last 4 CBG results once components are confirmed  Assessment/Plan:  1. CV - SR. Will restart Lisinopril at discharge 2.  Pulmonary - On room air. Chest tube removed yesterday. CXR this am is stable.  Encourage incentive spirometer. Await final pathology 3. Supplement potassium 4. Expected post op blood loss anemia-H and H this am stable at 11.9 and 36.2 5. On Lovenox for DVT  prophylaxis 6. History of myasthenia gravis-continue Mestinon 7. Discharge  Judea Fennimore M ZimmermanPA-C 04/27/2022,6:58 AM

## 2022-04-27 NOTE — TOC Transition Note (Signed)
Transition of Care [facility]) - CM/SW Discharge Note   Patient Details  Name: Sakara Lehtinen MRN: [id number] Date of Birth: [birth year]  [facility] CM/SW Contact:  Cyndi Bender, RN Phone Number: 04/27/2022, 9:12 AM   Clinical Narrative:     Patient stable for discharge.  No TOC needs.  Final next level of care: Home/Self Care Barriers to Discharge: Barriers Resolved   Patient Goals and CMS Choice      Discharge Placement                         Discharge Plan and Services Additional resources added to the After Visit Summary for                                       Social Determinants of Health (SDOH) Interventions SDOH Screenings   Food Insecurity: No Food Insecurity (04/26/2022)  Housing: [place]  (04/26/2022)  Transportation Needs: No Transportation Needs (04/26/2022)  Utilities: Not At Risk (04/26/2022)  Tobacco Use: Low Risk  (04/25/2022)     Readmission Risk Interventions    04/27/2022    9:12 AM  Readmission Risk Prevention Plan  Post Dischage Appt Patient refused  Medication Screening Complete  Transportation Screening Complete

## 2022-04-29 ENCOUNTER — Telehealth: Payer: Self-pay

## 2022-04-29 LAB — SURGICAL PATHOLOGY

## 2022-04-29 NOTE — Telephone Encounter (Signed)
Patient contacted the office with questions on wound care. She states that she has been very sore and swollen at her incision sites. She is s/p RATS Thymectomy with Dr. Roxan Hockey 04/25/22 and was discharged x2 days ago, 04/27/22. She has been taking her pain medication. She states that she has been taking showers and using soap and water on the incisions and asked if using Neosporin on the sites was acceptable. Advised that she should refrain from applying any creams/ointments at the site and continue to just keep them clean and dry. She states that the incisions are not reddened or no signs/symptoms of infection. Advised to continue to use soap/water and pat incisions dry and advised to contact the office if any other questions or concerns arise. She acknowledged receipt.

## 2022-05-04 ENCOUNTER — Telehealth: Payer: Self-pay

## 2022-05-04 NOTE — Telephone Encounter (Addendum)
Received message from [facility]  How would you like to move fwd? Thanks

## 2022-05-05 ENCOUNTER — Encounter: Payer: Medicare HMO | Admitting: Neurology

## 2022-05-05 NOTE — Telephone Encounter (Signed)
She has surgery on Feb 5th, I will see her again on Feb21, will make decision after that visit.

## 2022-05-09 ENCOUNTER — Other Ambulatory Visit: Payer: Self-pay | Admitting: Thoracic Surgery (Cardiothoracic Vascular Surgery)

## 2022-05-09 DIAGNOSIS — J9859 Other diseases of mediastinum, not elsewhere classified: Secondary | ICD-10-CM

## 2022-05-10 ENCOUNTER — Ambulatory Visit
Admission: RE | Admit: 2022-05-10 | Discharge: 2022-05-10 | Disposition: A | Discharge status: Home / Self Care | Payer: Medicare HMO | Source: Ambulatory Visit | Attending: Thoracic Surgery (Cardiothoracic Vascular Surgery) | Admitting: Thoracic Surgery (Cardiothoracic Vascular Surgery)

## 2022-05-10 ENCOUNTER — Other Ambulatory Visit: Payer: Self-pay | Admitting: Thoracic Surgery (Cardiothoracic Vascular Surgery)

## 2022-05-10 ENCOUNTER — Ambulatory Visit (INDEPENDENT_AMBULATORY_CARE_PROVIDER_SITE_OTHER): Payer: Self-pay | Admitting: Thoracic Surgery (Cardiothoracic Vascular Surgery)

## 2022-05-10 ENCOUNTER — Encounter: Payer: Self-pay | Admitting: *Deleted

## 2022-05-10 VITALS — BP 180/90 | HR 100 | Resp 18 | Ht <= 58 in | Wt 110.0 lb

## 2022-05-10 DIAGNOSIS — J9811 Atelectasis: Secondary | ICD-10-CM | POA: Diagnosis not present

## 2022-05-10 DIAGNOSIS — Z09 Encounter for follow-up examination after completed treatment for conditions other than malignant neoplasm: Secondary | ICD-10-CM

## 2022-05-10 DIAGNOSIS — I2699 Other pulmonary embolism without acute cor pulmonale: Secondary | ICD-10-CM

## 2022-05-10 DIAGNOSIS — J9859 Other diseases of mediastinum, not elsewhere classified: Secondary | ICD-10-CM

## 2022-05-10 DIAGNOSIS — R918 Other nonspecific abnormal finding of lung field: Secondary | ICD-10-CM | POA: Diagnosis not present

## 2022-05-10 DIAGNOSIS — I700 Atherosclerosis of aorta: Secondary | ICD-10-CM | POA: Diagnosis not present

## 2022-05-10 DIAGNOSIS — R222 Localized swelling, mass and lump, trunk: Secondary | ICD-10-CM | POA: Diagnosis not present

## 2022-05-10 DIAGNOSIS — M47814 Spondylosis without myelopathy or radiculopathy, thoracic region: Secondary | ICD-10-CM | POA: Diagnosis not present

## 2022-05-10 MED ORDER — IOPAMIDOL (ISOVUE-370) INJECTION 76%
75.0000 mL | Freq: Once | INTRAVENOUS | Status: AC | PRN
  Administered 2022-05-10: 75 mL via INTRAVENOUS

## 2022-05-10 NOTE — Progress Notes (Signed)
Reached out to Faith Massey to introduce myself as the office RN Navigator and explain our new patient process. Reviewed the reason for their referral and scheduled their new patient appointment along with labs. Provided address and directions to the office including call back phone number. Reviewed with patient any concerns they may have or any possible barriers to attending their appointment.   Informed patient about my role as a navigator and that I will meet with them prior to their New Patient appointment and more fully discuss what services I can provide. At this time patient has no further questions or needs.    Oncology Nurse Navigator Documentation     05/10/2022   12:30 PM  Oncology Nurse Navigator Flowsheets  Abnormal Finding Date 03/28/2022  Confirmed Diagnosis Date 04/25/2022  Surgery Actual Start Date: 04/25/2022  Navigator Follow Up Date: 05/13/2022  Navigator Follow Up Reason: New Patient Appointment  Navigator Location [facility]  Referral Date to RadOnc/MedOnc 05/10/2022  Navigator Encounter Type Introductory Phone Call  Patient Visit Type MedOnc  Treatment Phase Active Tx  Barriers/Navigation Needs Coordination of Care;Education  Education Other  Interventions Coordination of Care;Education  Acuity Level 2-Minimal Needs (1-2 Barriers Identified)  Education Method Verbal  Time Spent with Patient 30

## 2022-05-10 NOTE — Progress Notes (Signed)
[address]       [place] [address]             [phone]     HPI: Faith Massey returns for a scheduled follow-up visit after recent thymectomy  Faith Massey is a 77 year old woman with a history of hypertension, hyperlipidemia, breast cancer, osteoporosis, and recently diagnosed myasthenia gravis and thymoma.  She presented with myasthenia symptoms.  Workup included a CT of the chest which showed a mediastinal mass.  Myasthenia was confirmed with antibodies.  I did a robotic assisted thymectomy on 04/25/2022.  Her postoperative course was uncomplicated and she went home on day 2.  She says that she been feeling more short of breath over the past 3 to 4 days.  Has developed a cough and some congestion.  Denies swelling in her legs or calf pain.  Has had some myasthenia symptoms as well.  Has an appointment with neurology tomorrow.  Past Medical History:  Diagnosis Date   Breast CA ([place])    Hyperlipidemia    Hypertension    Myasthenia gravis ([place])    Dx'd 03/2022   Osteoporosis 10/12/2012     Current Outpatient Medications  Medication Sig Dispense Refill   aspirin EC 81 MG tablet Take 1 tablet (81 mg total) by mouth daily. Swallow whole. 30 tablet 12   Cholecalciferol (VITAMIN D-3) 5000 UNITS TABS Take 5,000 Units by mouth daily.     COLLAGEN PO Take 1 Scoop by mouth daily.     ezetimibe (ZETIA) 10 MG tablet Take 1 tablet (10 mg total) by mouth daily. 30 tablet 3   lisinopril (ZESTRIL) 20 MG tablet Take 1 tablet (20 mg total) by mouth daily. 30 tablet 1   Multiple Vitamins-Minerals (MULTIVITAMIN WITH MINERALS) tablet Take 1 tablet by mouth daily.     pyridostigmine (MESTINON) 60 MG tablet Take 1 tablet (60 mg total) by mouth 3 (three) times daily. 90 tablet 5   traMADol (ULTRAM) 50 MG tablet Take 1 tablet (50 mg total) by mouth every 6 (six) hours as needed (mild pain). 28 tablet 0   No current facility-administered medications for this visit.    Physical  Exam BP (!) 180/90   Pulse 100   Resp 18   Ht 4' 10"$  (1.473 m)   Wt 110 lb (49.9 kg)   SpO2 92% Comment: RA  BMI 22.74 kg/m  77 year old woman in no acute distress Alert and oriented x 3 with no focal deficits Lungs clear with equal breath sounds bilaterally Cardiac regular rate and rhythm Incisions clean dry and intact No peripheral edema  Diagnostic Tests: CHEST - 2 VIEW   COMPARISON:  Prior chest radiographs 04/27/2022 and earlier. Chest CT 03/28/2022.   FINDINGS: Heart size within normal limits. 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 Peribronchial thickening. Minimal atelectasis within the right lung base. Suspected trace left pleural effusion. No evidence of pneumothorax. No acute bony abnormality identified. Dextrocurvature of the mid to lower thoracic spine. Thoracolumbar spondylosis.   IMPRESSION: 1. 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 2. Peribronchial thickening, a finding which may be due to viral infection, reactive airway disease/asthma other causes of chronic airway inflammation. 3. Minimal atelectasis within the right lung base. 4. Suspected trace left pleural effusion, unchanged.     Electronically Signed   By:  Kellie Simmering D.O.   On: 05/10/2022 09:33 I personally reviewed the chest x-ray images.  Unremarkable.  Obviously anterior mediastinal mass has been removed.  Impression: Faith Massey is a 77 year old woman with a history of hypertension, hyperlipidemia, breast cancer, osteoporosis, recently diagnosed myasthenia gravis and thymoma.   Type B2 thymoma-status post thymectomy.  Grossly complete resection but questionable margins on permanent pathology.  Will refer to oncology and radiation oncology.  She prefers to see oncology in [facility] and will refer to [name].  Shortness  of breath-given her recent surgery I think we are obligated to do a CT angiogram to rule out a PE.  Will do that today.  Hopefully just related to her respiratory infection.  Myasthenia gravis-has had some return of symptoms.  She is taking her medications.  Has an appointment with neurology tomorrow.  I again reminded her that it can take up to a year for the effects of thymectomy to take place, and there is no guarantee that it will reverse her myasthenia.  Plan: CT angiogram to rule out PE Follow-up with neurology as scheduled Referral to oncology and radiation oncology Return in 2 weeks with PA lateral chest x-ray   Melrose Nakayama, MD [facility] [phone]

## 2022-05-11 ENCOUNTER — Ambulatory Visit: Payer: Medicare HMO | Admitting: Neurology

## 2022-05-11 ENCOUNTER — Telehealth: Payer: Self-pay | Admitting: Neurology

## 2022-05-11 ENCOUNTER — Encounter: Payer: Self-pay | Admitting: Neurology

## 2022-05-11 VITALS — BP 157/83 | HR 99 | Ht <= 58 in | Wt 110.0 lb

## 2022-05-11 DIAGNOSIS — R7309 Other abnormal glucose: Secondary | ICD-10-CM | POA: Diagnosis not present

## 2022-05-11 DIAGNOSIS — R748 Abnormal levels of other serum enzymes: Secondary | ICD-10-CM | POA: Diagnosis not present

## 2022-05-11 DIAGNOSIS — R799 Abnormal finding of blood chemistry, unspecified: Secondary | ICD-10-CM | POA: Diagnosis not present

## 2022-05-11 DIAGNOSIS — D4989 Neoplasm of unspecified behavior of other specified sites: Secondary | ICD-10-CM | POA: Diagnosis not present

## 2022-05-11 DIAGNOSIS — G709 Myoneural disorder, unspecified: Secondary | ICD-10-CM

## 2022-05-11 MED ORDER — CEPHALEXIN 250 MG PO CAPS: 250.0000 mg | ORAL_CAPSULE | Freq: Two times a day (BID) | ORAL | 0 refills | Status: DC

## 2022-05-11 MED ORDER — PREDNISONE 10 MG PO TABS: ORAL_TABLET | ORAL | 3 refills | Status: DC

## 2022-05-11 NOTE — Progress Notes (Signed)
Chief Complaint  Patient presents with   Follow-up    Rm 15 State her symptoms have not improved would like to discuss medication       ASSESSMENT AND PLAN  Faith Massey is a 77 y.o. female  Seropositive generalized myasthenia gravis,  Presenting with lack of stamina, excessive fatigue, shortness of breath with exertion since December, also developed fatigable right ptosis, hoarse voice head drop at her worst,  Diagnosis is confirmed by positive acetylcholine receptor binding, blocking antibody,  CT of the chest showed soft tissue mass at the right anterior mediastinum, 2.3 x 4 x 5.3 cm, likely represents adenoma or other thymic pathology,   Status post robotic thyroidectomy by Dr. Roxan Hockey on April 25, 2022, pathology showed benign thymoma, lymph node sample was all negative  She was doing very well postsurgically, no longer have head drop,  Concurrent with her upper respiratory symptoms on May 07, 2022, she noticed worsening symptoms, shortness of breath with exertion, mild dysphagia,  Discussed extensively with patient, IVIG was currently denied by her insurance company, decided to proceed with prednisone 40 mg daily, 5 mg decrement every 1 week, stay at 20 mg daily  Continue Mestinon 60 mg 3 times a day  Treat her with Keflex 250 twice a day for her probable bronchitis,  Call clinic for worsening symptoms     DIAGNOSTIC DATA (LABS, IMAGING, TESTING) - I reviewed patient records, labs, notes, testing and imaging myself where available.   MEDICAL HISTORY:  Faith Massey is a 77 year old female, accompanied by her sister Manuela Schwartz, seen in request by Dr. Garvin Fila, for evaluation of myasthenia gravis,   I reviewed and summarized the referring note.PMHX HTN HLD Breast Cancer, s/p double mastectomy in 1990  Patient lives in independent living, very active, has not seen her primary care since beginning of 2023,  In December 2023, she noticed lack of stamina, easily  fatigued, shortness of breath walking her dog walking up hills,  Woke up March 25, 2022, noticed right ptosis, intermittent hoarse voice, which has been persistent that was noticed by her church friend, and family members, who urged her to call primary care on January 8, who directed her to emergency room, she had a stroke evaluation, personally reviewed MRI of the brain March 28, 2022, 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 Leonie Man on March 31, 2022, she was noted to have right ptosis, mild bulbar weakness,  Acetylcholine receptor antibody was positive, blocking 51, binding 27.8, confirmed the diagnosis of myasthenia gravis, she has pending appointment with cardiothoracic surgeon Dr. Roxan Hockey on April 13, 2022  Rest of the laboratory evaluation showed normal CPK TSH, A1c 5.7, lipid panel LDL of 125, CBC WBC was elevated 12, normal hemoglobin of 13, CMP showed calcium 8.7, normal kidney function  Also discussed with her son Annie Main over the phone in detail about her diagnosis,  UPDATE May 11 2022: She is companied by her daughter at today's clinical visit, status post robotic assisted thyroidectomy by Dr. Roxan Hockey on April 25, 2022, Her postoperative course was uncomplicated and she went home on day 2.,  Recovered very well, pathology showed benign thymoma  A. THYMUS,  THYMECTOMY:  - Thymoma, type B2  - Focal extension into thymic fat present  - Margins appear focally involved  - See oncology table and comment   B. LYMPH NODE, MEDIASTINAL, BIOPSY:  - Three benign lymph nodes (0/3)   She was seen by Dr. Roxan Hockey on May 10, 2022, she began  to complains cough congestion on May 07, 2022, no fever,  CT angiogram of chest chest on May 10 2022:  1. No evidence of pulmonary embolism or other acute intrathoracic process. 2. Previously seen anterior mediastinal mass has been resected. No residual mass or fluid collection within the anterior mediastinum. 3. Aortic atherosclerosis (ICD10-I70.0).  She was doing very well postsurgically, prior to surgery, she has had drop, shortness of breath with minimal exertion, mild dysphagia, has to be chew carefully, intermittent ptosis,  Few days after surgery, she has significant improvement but concurrent with her upper respiratory symptoms, she noticed worsening myasthenia gravis symptoms, more shortness of breath, difficult to lie flat, has been sleeping in a sitting position over the past couple days, denies fever, denied double vision, no significant head drop,  She is taking Mestinon 60 mg 3 times daily   Physical examinations:   Vitals:   05/11/22 1421  BP: (!) 157/83  Pulse: 99  Weight: 110 lb (49.9 kg)  Height: 4' 10"$  (1.473 m)   Body mass index is 22.99 kg/m.  PHYSICAL EXAMNIATION:  Gen: NAD, conversant, well nourised, well groomed                     Cardiovascular: Regular rate rhythm, no peripheral edema, warm, nontender. Eyes: Conjunctivae clear without exudates or hemorrhage Neck: Supple, no carotid bruits. Pulmonary: Clear to auscultation bilaterally   NEUROLOGICAL EXAM:  MENTAL STATUS: Speech/cognition: Awake, alert, oriented to history taking and casual conversation CRANIAL NERVES: CN II: Visual fields are full to confrontation. Pupils are round equal and briskly reactive to light. CN III, IV, VI: extraocular movement are normal.  No ptosis, extraocular movement abnormality noted, CN V: Facial sensation is intact to light touch CN VII: No significant eye closure, cheek puff weakness, CN VIII: Hearing is normal to causal conversation. CN IX, X:  Phonation is normal. CN XI: Head turning and shoulder shrug are intact  MOTOR: Mild to moderate neck flexion, shoulder abduction, external rotation, elbow flexion, bilateral hip flexion weakness  REFLEXES: Reflexes are 1  and symmetric at the biceps, triceps, knees, and ankles. Plantar responses are flexor.  SENSORY: Intact to light touch, pinprick and vibratory sensation are intact in fingers and toes.  COORDINATION: There is no trunk or limb dysmetria noted.  GAIT/STANCE: She can get up from seated position, steady  REVIEW OF SYSTEMS:  Full 14 system review of systems performed and notable only for as above All other review of systems were negative.   ALLERGIES: No Known Allergies  HOME MEDICATIONS: Current Outpatient Medications  Medication Sig Dispense Refill   aspirin EC 81 MG tablet Take 1 tablet (81 mg total) by mouth daily. Swallow whole. 30 tablet 12   Cholecalciferol (VITAMIN D-3) 5000 UNITS TABS Take 5,000 Units by mouth daily.     COLLAGEN PO Take 1 Scoop by mouth daily.     ezetimibe (ZETIA) 10 MG tablet Take 1 tablet (10 mg total) by mouth daily. 30 tablet 3   lisinopril (ZESTRIL) 20 MG tablet Take 1 tablet (20 mg total) by mouth daily. 30 tablet 1   Multiple Vitamins-Minerals (MULTIVITAMIN WITH MINERALS) tablet Take 1  tablet by mouth daily.     pyridostigmine (MESTINON) 60 MG tablet Take 1 tablet (60 mg total) by mouth 3 (three) times daily. 90 tablet 5   traMADol (ULTRAM) 50 MG tablet Take 1 tablet (50 mg total) by mouth every 6 (six) hours as needed (mild pain). 28 tablet 0   No current facility-administered medications for this visit.    PAST MEDICAL HISTORY: Past Medical History:  Diagnosis Date   Breast CA ([place])    Hyperlipidemia    Hypertension    Myasthenia gravis ([place])    Dx'd 03/2022   Osteoporosis 10/12/2012    PAST SURGICAL HISTORY: Past Surgical History:  Procedure Laterality Date   ABDOMINAL HYSTERECTOMY  2005   BACK SURGERY  2000    MASTECTOMY Bilateral 1995    FAMILY HISTORY: Family History  Problem Relation Age of Onset   Stroke Mother    Hypertension Mother    Myasthenia gravis Mother    Hypertension Father     SOCIAL HISTORY: Social History   Socioeconomic History   Marital status: Divorced    Spouse name: Not on file   Number of children: Not on file   Years of education: Not on file   Highest education level: Not on file  Occupational History   Not on file  Tobacco Use   Smoking status: Never   Smokeless tobacco: Never  Substance and Sexual Activity   Alcohol use: Yes    Alcohol/week: 1.0 standard drink of alcohol    Types: 1 Glasses of wine per week   Drug use: No   Sexual activity: Not on file  Other Topics Concern   Not on file  Social History Narrative   Not on file   Social Determinants of Health   Financial Resource Strain: Not on file  Food Insecurity: No Food Insecurity (04/26/2022)   Hunger Vital Sign    Worried About Running Out of Food in the Last Year: Never true    Ran Out of Food in the Last Year: Never true  Transportation Needs: No Transportation Needs (04/26/2022)   PRAPARE - Hydrologist (Medical): No    Lack of Transportation (Non-Medical): No  Physical Activity: Not on file  Stress: Not on file  Social Connections: Not on file  Intimate Partner Violence: Not At Risk (04/26/2022)   Humiliation, Afraid, Rape, and Kick questionnaire    Fear of Current or Ex-Partner: No    Emotionally Abused: No    Physically Abused: No    Sexually Abused: No    Total time spent reviewing the chart, obtaining history, examined patient, ordering tests, documentation, consultations and family, care coordination was  60 minutes.    Marcial Pacas, M.D. Ph.D.  [facility] [address] [place], [place] [address] Ph: [phone] Fax: [phone]

## 2022-05-11 NOTE — Progress Notes (Signed)
Thoracic Location of Tumor / Histology:  Thymoma, type B2   Patient presented with symptoms of: from Dr. Leonarda Salon 04/13/22 office note: "presented with right eye ptosis and hoarseness progressing to near complete aphonia.  MRI showed a possible small subacute infarct in the right frontal lobe.  She was transferred to [facility] for stroke workup.  She was started on aspirin and Plavix.  As part of that workup she had a CT of the head and neck which showed an anterior mediastinal mass.  Further workup revealed marked elevation of the acetylcholine receptor antibodies confirming the diagnosis of myasthenia gravis.  Her symptoms improved and she was discharged"  2-View Chest X-ray 05/10/2022 --IMPRESSION: A known anterior mediastinal mass is occult by radiography and was better appreciated on the prior chest CT of 03/28/2022. Peribronchial thickening, a finding which may be due to viral infection, reactive airway disease/asthma other causes of chronic airway inflammation. Minimal atelectasis within the right lung base. Suspected trace left pleural effusion, unchanged.  Biopsies revealed:  04/25/2022 A. THYMUS, THYMECTOMY:  - Thymoma, type B2  - Focal extension into thymic fat present  - Margins appear focally involved  - See oncology table and comment  B. LYMPH NODE, MEDIASTINAL, BIOPSY:  - Three benign lymph nodes (0/3)  Comment(s): By immunohistochemistry, the neoplastic epithelial cells are highlighted by cytokeratin AE1/3.  Abundant background lymphocytes are present.   Tobacco/Marijuana/Snuff/ETOH use: Patient has never smoked or used recreational drugs. Reports occasional/rare alcohol consumption  Past/Anticipated interventions by cardiothoracic surgery, if any:  05/10/2022 --Dr. Modesto Charon (office visit) Impression: Type B2 thymoma-status post thymectomy.  Grossly complete resection but questionable margins on permanent pathology.  Will refer to oncology and radiation  oncology.  She prefers to see oncology in [facility] and will refer to [name]. Shortness of breath-given her recent surgery I think we are obligated to do a CT angiogram to rule out a PE.  Will do that today.  Hopefully just related to her respiratory infection. Myasthenia gravis-has had some return of symptoms.  She is taking her medications.  Has an appointment with neurology tomorrow.  I again reminded her that it can take up to a year for the effects of thymectomy to take place, and there is no guarantee that it will reverse her myasthenia. Plan: CT angiogram to rule out PE 05/10/22 --IMPRESSION: 1. No evidence of pulmonary embolism or other acute intrathoracic process. 2. Previously seen anterior mediastinal mass has been resected. No residual mass or fluid collection within the anterior mediastinum. 3. Aortic atherosclerosis (ICD10-I70.0). Follow-up with neurology as scheduled Referral to oncology and radiation oncology Return in 2 weeks with PA lateral chest x-ray  04/25/2022 --Dr. Remo Lipps Hendrickson/Erin Barrett, PA-C [facility] THYMECTOM[facility]   Past/Anticipated interventions by medical oncology, if any:  Scheduled for consultation with Dr. Burney Gauze tomorrow 05/13/2022  Signs/Symptoms Weight changes, if any:  Wt Readings from Last 3 Encounters:  05/12/22 110 lb 9.6 oz (50.2 kg)  05/11/22 110 lb (49.9 kg)  05/10/22 110 lb (49.9 kg)   Respiratory complaints, if any: Reports on-going shortness of breath and productive cough (currently taking a prednisone taper and oral antibiotics for bronchitis) Hemoptysis, if any: Denies Pain issues, if any:  Reports an occasional/random sharp pain to her chest near surgical site. States it resolves quickly on its own  SAFETY ISSUES: Prior radiation? Bilateral mastectomy 1995 (done in [facility]) Pacemaker/ICD? No  Possible current pregnancy? No--hysterectomy Is the patient on methotrexate? No  Current Complaints /  other  details:  Occasional swallowing issues related to newly diagnosed myasthenia gravis.

## 2022-05-11 NOTE — Progress Notes (Signed)
Radiation Oncology         [phone] ________________________________  Initial Outpatient Consultation  Name: Faith Massey MRN: [id number]  Date: 05/12/2022  DOB: 02/14/1946  [id number], No Pcp Per  Melrose Nakayama, *   REFERRING PHYSICIAN: Melrose Nakayama, *  DIAGNOSIS: There were no encounter diagnoses.  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 Presented with stroke like symptoms in January 2024. Work-up incdentally revealed an anterior mediastinal mass and myasthenia gravis.   HISTORY OF PRESENT ILLNESS::Faith Massey is a 77 y.o. female who is accompanied by ***. she is seen as a courtesy of Dr. Roxan Hockey for an opinion concerning radiation therapy as part of management for her recently diagnosed thymoma.   The patient presented to the ED on 03/28/22 with right-sided facial droop, difficulty finding words, and intermittent hoarseness. 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 Workup revealed marked elevation of the acetylcholine receptor antibodies confirming the diagnosis of myasthenia gravis. CTA of the head and neck performed upon admission also incidentally revealed a 4.3 cm partially covered mass or collection in the anterior mediastinum.  CTA of the chest redemonstrated the right anterior mediastinal mass, measuring 3 x 4 x 5.3 cm. Differential considerations noted at that time included thymoma, a thymic mass, lymphoma, or a metastatic focus. Hospital course included Zetia, and she was discharged home on 03/30/22.   Following discharge, the patient was accordingly referred to Dr. Roxan Hockey on 04/13/22 for further evaluation and management. During this visit, the patient reported noticing recent instances of SOB when walking on inclined surfaces. In light of her recent diagnosis of myasthenia correlating with the differential  dx of a thymic tumor, Dr. Roxan Hockey advised proceeding with surgical resection.  The patient opted to proceed with right thymectomy and nodal biopsies on 04/25/22 under Dr. Roxan Hockey. Pathology from the procedure revealed: tumor the size of 6.1 cm; histology of type B2 thymoma with focal extension into the thymic fat and focally involved margins. Nodal status of 3/3 mediastinal lymph nodes negative for malignancy. Her postoperative course was uncomplicated and she was discharged home the following day.   During her most recent follow-up visit with Dr. Roxan Hockey on 05/10/22, the patient endorsed an increase in shortness of breath over the past 3-4 days, a new cough with some congestion, and myasthenia symptoms. In light of these symptoms, the patient had a CTA of the chest performed on 05/10/22 to rule out PE which showed no evidence of pulmonary embolism or other acute intrathoracic process. CT also showed no residual mass or fluid collection within the anterior mediastinum. She also had a chest x-ray performed that same day which showed evidence of peribronchial thickening, possibly attributed to a viral infection, reactive airway disease/asthma, or other causes of chronic airway inflammation. X-ray also showed an unchanged suspected trace left pleural effusion, and minimal atelectasis within the right lung base (both previously seen on post-op chest x-ray performed on 04/27/22).  Given her questionable margins on final pathology, Dr. Roxan Hockey referred the patient to medical and radiation oncology for consideration of adjuvant treatment.   With regards to her myasthenia gravis, the patient is followed by Dr. Krista Blue at [facility]. At her most recent follow up visit with Dr. Krista Blue yesterday, the patient was started on a prednisone taper, as well as keflex for suspected bronchitis.   Of note: the patient has a history of breast cancer,  s/p double mastectomy in 1990   PREVIOUS RADIATION THERAPY:  {EXAM; YES/NO:19492::"No"}  PAST MEDICAL HISTORY:  Past Medical History:  Diagnosis Date   Breast CA ([place])    Hyperlipidemia    Hypertension    Myasthenia gravis ([place])    Dx'd 03/2022   Osteoporosis 10/12/2012    PAST SURGICAL HISTORY: Past Surgical History:  Procedure Laterality Date   ABDOMINAL HYSTERECTOMY  2005   BACK SURGERY  2000   MASTECTOMY Bilateral 1995    FAMILY HISTORY:  Family History  Problem Relation Age of Onset   Stroke Mother    Hypertension Mother    Myasthenia gravis Mother    Hypertension Father     SOCIAL HISTORY:  Social History   Tobacco Use   Smoking status: Never   Smokeless tobacco: Never  Substance Use Topics   Alcohol use: Yes    Alcohol/week: 1.0 standard drink of alcohol    Types: 1 Glasses of wine per week   Drug use: No    ALLERGIES: No Known Allergies  MEDICATIONS:  Current Outpatient Medications  Medication Sig Dispense Refill   aspirin EC 81 MG tablet Take 1 tablet (81 mg total) by mouth daily. Swallow whole. 30 tablet 12   cephALEXin (KEFLEX) 250 MG capsule Take 1 capsule (250 mg total) by mouth 2 (two) times daily. 14 capsule 0   Cholecalciferol (VITAMIN D-3) 5000 UNITS TABS Take 5,000 Units by mouth daily.     COLLAGEN PO Take 1 Scoop by mouth daily.     ezetimibe (ZETIA) 10 MG tablet Take 1 tablet (10 mg total) by mouth daily. 30 tablet 3   lisinopril (ZESTRIL) 20 MG tablet Take 1 tablet (20 mg total) by mouth daily. 30 tablet 1   Multiple Vitamins-Minerals (MULTIVITAMIN WITH MINERALS) tablet Take 1 tablet by mouth daily.     predniSONE (DELTASONE) 10 MG tablet 4 tabs qam xone week, 3.5 tabs qam xone week 3 tabs qam xone week 2.5 tab qam xone week 2 tabs qam 120 tablet 3   pyridostigmine (MESTINON) 60 MG tablet Take 1 tablet (60 mg total) by mouth 3 (three) times daily. 90 tablet 5   traMADol (ULTRAM) 50 MG tablet Take 1 tablet (50 mg total) by mouth every 6 (six) hours as needed (mild pain). 28 tablet 0   No  current facility-administered medications for this encounter.    REVIEW OF SYSTEMS:  A 10+ POINT REVIEW OF SYSTEMS WAS OBTAINED including neurology, dermatology, psychiatry, cardiac, respiratory, lymph, extremities, GI, GU, musculoskeletal, constitutional, reproductive, HEENT. ***   PHYSICAL EXAM:  vitals were not taken for this visit.   General: Alert and oriented, in no acute distress HEENT: Head is normocephalic. Extraocular movements are intact. Oropharynx is clear. Neck: Neck is supple, no palpable cervical or supraclavicular lymphadenopathy. Heart: Regular in rate and rhythm with no murmurs, rubs, or gallops. Chest: Clear to auscultation bilaterally, with no rhonchi, wheezes, or rales. Abdomen: Soft, nontender, nondistended, with no rigidity or guarding. Extremities: No cyanosis or edema. Lymphatics: see Neck Exam Skin: No concerning lesions. Musculoskeletal: symmetric strength and muscle tone throughout. Neurologic: Cranial nerves II through XII are grossly intact. No obvious focalities. Speech is fluent. Coordination is intact. Psychiatric: Judgment and insight are intact. Affect is appropriate. ***  ECOG = ***  0 - Asymptomatic (Fully active, able to carry on all predisease activities without restriction)  1 - Symptomatic but completely ambulatory (Restricted in physically strenuous activity but ambulatory and able to carry out work of a  light or sedentary nature. For example, light housework, office work)  2 - Symptomatic, <50% in bed during the day (Ambulatory and capable of all self care but unable to carry out any work activities. Up and about more than 50% of waking hours)  3 - Symptomatic, >50% in bed, but not bedbound (Capable of only limited self-care, confined to bed or chair 50% or more of waking hours)  4 - Bedbound (Completely disabled. Cannot carry on any self-care. Totally confined to bed or chair)  5 - Death   Eustace Pen MM, Creech RH, Tormey DC, et al. [phone]).  "Toxicity and response criteria of the [facility]". [place] Oncol. 5 (6): 649-55  LABORATORY DATA:  Lab Results  Component Value Date   WBC 12.5 (H) 04/27/2022   HGB 11.9 (L) 04/27/2022   HCT 36.2 04/27/2022   MCV 96.3 04/27/2022   PLT 202 04/27/2022   NEUTROABS 9.3 (H) 03/28/2022   Lab Results  Component Value Date   NA 140 04/27/2022   K 3.8 04/27/2022   CL 112 (H) 04/27/2022   CO2 21 (L) 04/27/2022   GLUCOSE 108 (H) 04/27/2022   BUN 12 04/27/2022   CREATININE 0.69 04/27/2022   CALCIUM 8.3 (L) 04/27/2022      RADIOGRAPHY: CT Angio Chest Pulmonary Embolism (PE) W or WO Contrast  Result Date: 05/10/2022 CLINICAL DATA:  Pulmonary embolism (PE) suspected, high prob. Recent thymectomy on 04/25/2022 EXAM: CT ANGIOGRAPHY CHEST WITH CONTRAST TECHNIQUE: Multidetector CT imaging of the chest was performed using the standard protocol during bolus administration of intravenous contrast. Multiplanar CT image reconstructions and MIPs were obtained to evaluate the vascular anatomy. RADIATION DOSE REDUCTION: This exam was performed according to the departmental dose-optimization program which includes automated exposure control, adjustment of the mA and/or kV according to patient size and/or use of iterative reconstruction technique. CONTRAST:  62m ISOVUE-370 IOPAMIDOL (ISOVUE-370) INJECTION 76% COMPARISON:  CT 03/28/2022. FINDINGS: Cardiovascular: Satisfactory opacification of the pulmonary arteries to the segmental level. No evidence of pulmonary embolism. Thoracic aorta is nonaneurysmal. Scattered aortic atherosclerotic calcification. Normal heart size. No pericardial effusion. Mediastinum/Nodes: Previously seen anterior mediastinal mass has been resected. No residual mass or fluid collection within the anterior mediastinum. No axillary, mediastinal, or hilar lymphadenopathy. Thyroid, trachea, and esophagus within normal limits. Lungs/Pleura: Mild bibasilar atelectasis.  No pleural effusion or pneumothorax. Upper Abdomen: No acute abnormality. Musculoskeletal: Bilateral breast prostheses. Multilevel thoracic spondylosis. No new or acute bony findings. Review of the MIP images confirms the above findings. IMPRESSION: 1. No evidence of pulmonary embolism or other acute intrathoracic process. 2. Previously seen anterior mediastinal mass has been resected. No residual mass or fluid collection within the anterior mediastinum. 3. Aortic atherosclerosis (ICD10-I70.0). Electronically Signed   By: NDavina PokeD.O.   On: 05/10/2022 15:49   DG Chest 2 View  Result Date: 05/10/2022 CLINICAL DATA:  Provided history: Anterior mediastinal mass. EXAM: CHEST - 2 VIEW COMPARISON:  Prior chest radiographs 04/27/2022 and earlier. Chest CT 03/28/2022. FINDINGS: Heart size within normal limits. 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 Peribronchial thickening. Minimal atelectasis within the right lung base. Suspected trace left pleural effusion. No evidence of pneumothorax. No acute bony abnormality identified. Dextrocurvature of the mid to lower thoracic spine. Thoracolumbar spondylosis. IMPRESSION: 1. 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 2. Peribronchial thickening, a  finding which may be due to viral infection, reactive airway disease/asthma other causes of chronic airway inflammation. 3. Minimal atelectasis within the right lung base. 4. Suspected trace left pleural effusion, unchanged. Electronically Signed   By: Kellie Simmering D.O.   On: 05/10/2022 09:33   DG Chest 2 View  Result Date: 04/27/2022 CLINICAL DATA:  Pneumothorax. EXAM: CHEST - 2 VIEW COMPARISON:  April 26, 2022. FINDINGS: The heart size and mediastinal contours are within normal limits. Right lung is clear. Minimal left basilar subsegmental atelectasis is  noted with small left pleural effusion. No pneumothorax is noted. The visualized skeletal structures are unremarkable. IMPRESSION: Stable minimal left basilar subsegmental atelectasis with small left pleural effusion. Electronically Signed   By: Marijo Conception M.D.   On: 04/27/2022 08:42   DG Chest Port 1V same Day  Result Date: 04/26/2022 CLINICAL DATA:  Follow-up pneumothorax EXAM: PORTABLE CHEST 1 VIEW COMPARISON:  Chest radiograph from earlier today. FINDINGS: Right chest tube removed. Right internal jugular central venous catheter terminates in the lower third of the SVC. Stable cardiomediastinal silhouette with normal heart size. No pneumothorax. No significant pleural effusion. Stable mild curvilinear bibasilar scarring versus atelectasis. No pulmonary edema. IMPRESSION: 1. No pneumothorax status post right chest tube removal. 2. Stable mild curvilinear bibasilar scarring versus atelectasis. Electronically Signed   By: Ilona Sorrel M.D.   On: 04/26/2022 12:48   DG Chest Port 1 View  Result Date: 04/26/2022 CLINICAL DATA:  History of thymectomy. EXAM: PORTABLE CHEST 1 VIEW COMPARISON:  Chest x-ray 04/25/2022, 04/21/2022. CT chest 03/28/2022. FINDINGS: Right IJ line and chest tube in unchanged position. Tip of chest tube is midline. Mediastinum appears unchanged. Heart size stable. Small right apical lucency is again noted as previously described. No interim change. As noted on prior exam continued close follow-up exams suggested to exclude tiny pneumothorax. Mild bibasilar atelectasis. No pleural effusion. Degenerative changes and scoliosis thoracic spine. IMPRESSION: 1. Right IJ line chest tube in unchanged position. Tip of chest tube is midline. Mediastinum appears unchanged. 2. Small right apical lucency is again noted as previously described. No interim change. As noted on prior exam continued close follow-up exams suggested to exclude tiny right apical pneumothorax. 3.  Mild bibasilar atelectasis.  Electronically Signed   By: Marcello Moores  Register M.D.   On: 04/26/2022 06:26   DG Chest Port 1 View  Result Date: 04/25/2022 CLINICAL DATA:  [id number] S/P thymectomy [id number] EXAM: PORTABLE CHEST 1 VIEW COMPARISON:  April 21, 2022 FINDINGS: The cardiomediastinal silhouette is unchanged in contour given differences in technique.RIGHT IJ CVC tip terminates over the distal SVC. RIGHT chest tube projecting over midline. No pleural effusion. Query a trace RIGHT apical pneumothorax. Scattered basilar linear opacities most consistent with atelectasis. Gaseous distension of bowel beneath the LEFT hemidiaphragm. IMPRESSION: 1. Query trace RIGHT apical pneumothorax with RIGHT chest tube in place with tip projecting over midline. Recommend attention on follow-up. 2.  Support apparatus as described above. Electronically Signed   By: Valentino Saxon M.D.   On: 04/25/2022 10:57   DG Chest 2 View  Result Date: 04/21/2022 CLINICAL DATA:  Preop chest exam for mediastinal mass and myasthenia gravis EXAM: CHEST - 2 VIEW COMPARISON:  03/28/2022 FINDINGS: Stable cardiomediastinal silhouette with fullness in the right hilum. No focal consolidation, pleural effusion, or pneumothorax. No displaced rib fractures. IMPRESSION: No change from 03/28/2022. Known anterior mediastinal mass was better demonstrated on CT. Electronically Signed   By: Placido Sou M.D.   On: 04/21/2022 23:43  LONG TERM MONITOR (3-14 DAYS)  Result Date: 04/21/2022 Images from the original result were not included. Zio Patch Extended out patient EKG monitoring 14 days starting 03/30/2022: Predominant Rhythm : Normal sinus rhythm min HR: 52 bpm at 12:30 AM. Max HR 148 bpm at 2:20 PM Atrial arrhythmias:   Brief episodes of atrial tachycardia, longest 7 beats. Atrial fibrillation:   None Ventricular arrhythmias:  Rare PVCs. PVC Burden <0.1% Heart Block:    None Symptoms:    No symptoms reported.      IMPRESSION: Type B2 thymoma with focal extension into the  thymic fat and focally involved/questionable margins. Presented with stroke like symptoms in January 2024. Work-up incdentally revealed an anterior mediastinal mass and myasthenia gravis.   ***  Today, I talked to the patient and family about the findings and work-up thus far.  We discussed the natural history of *** and general treatment, highlighting the role of radiotherapy in the management.  We discussed the available radiation techniques, and focused on the details of logistics and delivery.  We reviewed the anticipated acute and late sequelae associated with radiation in this setting.  The patient was encouraged to ask questions that I answered to the best of my ability. *** A patient consent form was discussed and signed.  We retained a copy for our records.  The patient would like to proceed with radiation and will be scheduled for CT simulation.  PLAN: ***    *** minutes of total time was spent for this patient encounter, including preparation, face-to-face counseling with the patient and coordination of care, physical exam, and documentation of the encounter.   ------------------------------------------------  Blair Promise, PhD, MD  This document serves as a record of services personally performed by Gery Pray, MD. It was created on his behalf by Roney Mans, a trained medical scribe. The creation of this record is based on the scribe's personal observations and the provider's statements to them. This document has been checked and approved by the attending provider.

## 2022-05-11 NOTE — Telephone Encounter (Signed)
error 

## 2022-05-12 ENCOUNTER — Ambulatory Visit
Admission: RE | Admit: 2022-05-12 | Discharge: 2022-05-12 | Disposition: A | Discharge status: Home / Self Care | Payer: Medicare HMO | Source: Ambulatory Visit | Attending: Radiation Oncology | Admitting: Radiation Oncology

## 2022-05-12 ENCOUNTER — Encounter: Payer: Self-pay | Admitting: Radiation Oncology

## 2022-05-12 ENCOUNTER — Telehealth: Payer: Self-pay | Admitting: Neurology

## 2022-05-12 VITALS — BP 154/80 | HR 91 | Temp 97.9°F | Resp 18 | Ht 58.5 in | Wt 110.6 lb

## 2022-05-12 DIAGNOSIS — Z7952 Long term (current) use of systemic steroids: Secondary | ICD-10-CM | POA: Insufficient documentation

## 2022-05-12 DIAGNOSIS — M47814 Spondylosis without myelopathy or radiculopathy, thoracic region: Secondary | ICD-10-CM | POA: Diagnosis not present

## 2022-05-12 DIAGNOSIS — C37 Malignant neoplasm of thymus: Secondary | ICD-10-CM | POA: Diagnosis not present

## 2022-05-12 DIAGNOSIS — Z7982 Long term (current) use of aspirin: Secondary | ICD-10-CM | POA: Diagnosis not present

## 2022-05-12 DIAGNOSIS — I10 Essential (primary) hypertension: Secondary | ICD-10-CM | POA: Insufficient documentation

## 2022-05-12 DIAGNOSIS — J40 Bronchitis, not specified as acute or chronic: Secondary | ICD-10-CM | POA: Insufficient documentation

## 2022-05-12 DIAGNOSIS — I700 Atherosclerosis of aorta: Secondary | ICD-10-CM | POA: Diagnosis not present

## 2022-05-12 DIAGNOSIS — Z9013 Acquired absence of bilateral breasts and nipples: Secondary | ICD-10-CM | POA: Diagnosis not present

## 2022-05-12 DIAGNOSIS — G7000 Myasthenia gravis without (acute) exacerbation: Secondary | ICD-10-CM | POA: Diagnosis not present

## 2022-05-12 DIAGNOSIS — Z79899 Other long term (current) drug therapy: Secondary | ICD-10-CM | POA: Diagnosis not present

## 2022-05-12 DIAGNOSIS — J90 Pleural effusion, not elsewhere classified: Secondary | ICD-10-CM | POA: Insufficient documentation

## 2022-05-12 DIAGNOSIS — R222 Localized swelling, mass and lump, trunk: Secondary | ICD-10-CM | POA: Insufficient documentation

## 2022-05-12 DIAGNOSIS — E785 Hyperlipidemia, unspecified: Secondary | ICD-10-CM | POA: Insufficient documentation

## 2022-05-12 DIAGNOSIS — D4989 Neoplasm of unspecified behavior of other specified sites: Secondary | ICD-10-CM

## 2022-05-12 HISTORY — DX: Malignant neoplasm of thymus: C37

## 2022-05-12 LAB — COMPREHENSIVE METABOLIC PANEL
ALT: 34 IU/L — ABNORMAL HIGH (ref 0–32)
AST: 24 IU/L (ref 0–40)
Albumin/Globulin Ratio: 2 (ref 1.2–2.2)
Albumin: 3.9 g/dL (ref 3.8–4.8)
Alkaline Phosphatase: 102 IU/L (ref 44–121)
BUN/Creatinine Ratio: 22 (ref 12–28)
BUN: 15 mg/dL (ref 8–27)
Bilirubin Total: 0.5 mg/dL (ref 0.0–1.2)
CO2: 24 mmol/L (ref 20–29)
Calcium: 9.2 mg/dL (ref 8.7–10.3)
Chloride: 104 mmol/L (ref 96–106)
Creatinine, Ser: 0.68 mg/dL (ref 0.57–1.00)
Globulin, Total: 2 g/dL (ref 1.5–4.5)
Glucose: 85 mg/dL (ref 70–99)
Potassium: 3.9 mmol/L (ref 3.5–5.2)
Sodium: 143 mmol/L (ref 134–144)
Total Protein: 5.9 g/dL — ABNORMAL LOW (ref 6.0–8.5)
eGFR: 91 mL/min/{1.73_m2} (ref >59)

## 2022-05-12 LAB — CBC WITH DIFFERENTIAL/PLATELET
Basophils Absolute: 0.1 10*3/uL (ref 0.0–0.2)
Basos: 1 %
EOS (ABSOLUTE): 0.3 10*3/uL (ref 0.0–0.4)
Eos: 2 %
Hematocrit: 41.1 % (ref 34.0–46.6)
Hemoglobin: 13.6 g/dL (ref 11.1–15.9)
Immature Grans (Abs): 0.1 10*3/uL (ref 0.0–0.1)
Immature Granulocytes: 1 %
Lymphocytes Absolute: 1.9 10*3/uL (ref 0.7–3.1)
Lymphs: 16 %
MCH: 30.8 pg (ref 26.6–33.0)
MCHC: 33.1 g/dL (ref 31.5–35.7)
MCV: 93 fL (ref 79–97)
Monocytes Absolute: 1.5 10*3/uL — ABNORMAL HIGH (ref 0.1–0.9)
Monocytes: 12 %
Neutrophils Absolute: 8.5 10*3/uL — ABNORMAL HIGH (ref 1.4–7.0)
Neutrophils: 68 %
Platelets: 318 10*3/uL (ref 150–450)
RBC: 4.42 x10E6/uL (ref 3.77–5.28)
RDW: 13.5 % (ref 11.7–15.4)
WBC: 12.3 10*3/uL — ABNORMAL HIGH (ref 3.4–10.8)

## 2022-05-12 LAB — HGB A1C W/O EAG: Hgb A1c MFr Bld: 6.1 % — ABNORMAL HIGH (ref 4.8–5.6)

## 2022-05-12 MED ORDER — PREDNISONE 10 MG PO TABS: ORAL_TABLET | ORAL | 3 refills | Status: DC

## 2022-05-12 NOTE — Telephone Encounter (Signed)
Pt called wanting to know what is the update of sending in her Rx for her predniSONE (DELTASONE) 10 MG tablet

## 2022-05-12 NOTE — Addendum Note (Signed)
Addended by: Reyne Dumas on: 05/12/2022 04:41 PM   Modules accepted: Orders

## 2022-05-12 NOTE — Addendum Note (Signed)
Addended by: Reyne Dumas on: 05/12/2022 04:42 PM   Modules accepted: Orders

## 2022-05-12 NOTE — Telephone Encounter (Signed)
I called left message   Was able to talk with her daughter about laboratory result, mild elevated WBC with neutrophil, mild elevated A1c 6.1  She is to continue with prednisone, and antibiotic, follow-up on March 15

## 2022-05-12 NOTE — Telephone Encounter (Signed)
Rx resent as written by Dr. Krista Blue.

## 2022-05-12 NOTE — Telephone Encounter (Signed)
[facility] asking if [facility] can be re-fax for  predniSONE (DELTASONE) 10 MG tablet. Stated it was never received

## 2022-05-12 NOTE — Telephone Encounter (Signed)
Rx is printing, even with changed settings. I have placed rx in Dr. Rhea Belton box for signature.

## 2022-05-13 ENCOUNTER — Inpatient Hospital Stay: Payer: Medicare HMO | Admitting: Hematology & Oncology

## 2022-05-13 ENCOUNTER — Encounter: Payer: Self-pay | Admitting: *Deleted

## 2022-05-13 ENCOUNTER — Inpatient Hospital Stay: Payer: Medicare HMO

## 2022-05-13 NOTE — Progress Notes (Signed)
Patient is scheduled for new patient appointment today, however she called stating she has a cough and sore throat. Appointment rescheduled to next week.   Oncology Nurse Navigator Documentation     05/13/2022    8:45 AM  Oncology Nurse Navigator Flowsheets  Navigator Follow Up Date: 05/18/2022  Navigator Follow Up Reason: New Patient Appointment  Navigator Location [facility]  Navigator Encounter Type Telephone  Telephone Appt Confirmation/Clarification;Incoming Call  Patient Visit Type MedOnc  Treatment Phase Active Tx  Barriers/Navigation Needs Coordination of Care;Education  Interventions Coordination of Care  Acuity Level 2-Minimal Needs (1-2 Barriers Identified)  Coordination of Care Appts  Time Spent with Patient 15

## 2022-05-16 ENCOUNTER — Telehealth: Payer: Self-pay | Admitting: Neurology

## 2022-05-16 ENCOUNTER — Other Ambulatory Visit: Payer: Self-pay

## 2022-05-16 MED ORDER — PYRIDOSTIGMINE BROMIDE 60 MG PO TABS: 60.0000 mg | ORAL_TABLET | Freq: Three times a day (TID) | ORAL | 5 refills | Status: DC

## 2022-05-16 MED ORDER — PYRIDOSTIGMINE BROMIDE 60 MG PO TABS: 60.0000 mg | ORAL_TABLET | Freq: Three times a day (TID) | ORAL | 6 refills | Status: DC

## 2022-05-16 NOTE — Telephone Encounter (Signed)
Pt is calling stated she needs a refill on pyridostigmine (MESTINON) 60 MG tablet. Refill should be sent to [facility]. Pt is asking a nurse please give her a call she have questions about medication predniSONE (DELTASONE) 10 MG tablet. Pt wants to clarify that its a 7 day week and not 5 days. She is requesting a call back from nurse.

## 2022-05-16 NOTE — Telephone Encounter (Signed)
predniSONE (DELTASONE) 10 MG tablet  3 ordered       Summary: 4 tabs qam xone week, 3.5 tabs qam xone week 3 tabs qam xone week 2.5 tab qam xone week 2 tabs qam, Print Start: 02/22/2024Ord/Sold: 05/12/2022 (O)Ordered On: 02/22/2024Pharmacy: [facility] [address] - [place], [place] - [address] [place] [address]: Dx Associated: Taking: Long-term: Med Note:        pyridostigmine (MESTINON) 60 MG tablet 60 mg, 3 times daily 6 ordered       Summary: Take 1 tablet (60 mg total) by mouth 3 (three) times daily., Starting Mon 05/16/2022, Normal Dose, Route, Frequency: 60 mg, Oral, 3 times dailyStart: 02/26/2024Ord/Sold: 05/16/2022 (O)Ordered On: 02/26/2024Pharmacy: [facility] [address] - [place], [place] - [address] [place] [address]     I failed to reach patient, please call her again, both prednisone and Mestinon prescription was sent to [facility] on [address] at [employer]  Prednisone instruction is 10 mg tablets, 4 tablets every morning after breakfast for 1 week, next week will be 3.5 mg every morning for 1 week, then 3 tablets every morning for 1 week, then 2.5 mg every morning for 1 week, then stay on 2 tablets every morning

## 2022-05-16 NOTE — Addendum Note (Signed)
Addended by: Marcial Pacas on: 05/16/2022 04:44 PM   Modules accepted: Orders

## 2022-05-17 NOTE — Telephone Encounter (Signed)
Called and relayed message per dr. Krista Blue. She was grateful for the call and encouraged to reach back out if needed.

## 2022-05-18 ENCOUNTER — Inpatient Hospital Stay: Payer: Medicare HMO | Attending: Hematology & Oncology

## 2022-05-18 ENCOUNTER — Encounter: Payer: Self-pay | Admitting: *Deleted

## 2022-05-18 ENCOUNTER — Inpatient Hospital Stay: Payer: Medicare HMO | Admitting: Hematology & Oncology

## 2022-05-18 ENCOUNTER — Other Ambulatory Visit: Payer: Self-pay

## 2022-05-18 VITALS — BP 159/82 | HR 78 | Temp 97.7°F | Resp 0 | Ht <= 58 in | Wt 112.0 lb

## 2022-05-18 DIAGNOSIS — C37 Malignant neoplasm of thymus: Secondary | ICD-10-CM | POA: Diagnosis not present

## 2022-05-18 DIAGNOSIS — G7000 Myasthenia gravis without (acute) exacerbation: Secondary | ICD-10-CM | POA: Insufficient documentation

## 2022-05-18 DIAGNOSIS — Z9013 Acquired absence of bilateral breasts and nipples: Secondary | ICD-10-CM | POA: Insufficient documentation

## 2022-05-18 DIAGNOSIS — Z90710 Acquired absence of both cervix and uterus: Secondary | ICD-10-CM

## 2022-05-18 DIAGNOSIS — Z86000 Personal history of in-situ neoplasm of breast: Secondary | ICD-10-CM | POA: Diagnosis not present

## 2022-05-18 LAB — CMP (CANCER CENTER ONLY)
ALT: 31 U/L (ref 0–44)
AST: 18 U/L (ref 15–41)
Albumin: 4 g/dL (ref 3.5–5.0)
Alkaline Phosphatase: 75 U/L (ref 38–126)
Anion gap: 9 (ref 5–15)
BUN: 16 mg/dL (ref 8–23)
CO2: 28 mmol/L (ref 22–32)
Calcium: 9.8 mg/dL (ref 8.9–10.3)
Chloride: 106 mmol/L (ref 98–111)
Creatinine: 0.69 mg/dL (ref 0.44–1.00)
GFR, Estimated: >60 mL/min (ref >60)
Glucose, Bld: 136 mg/dL — ABNORMAL HIGH (ref 70–99)
Potassium: 4.1 mmol/L (ref 3.5–5.1)
Sodium: 143 mmol/L (ref 135–145)
Total Bilirubin: 0.6 mg/dL (ref 0.3–1.2)
Total Protein: 6.4 g/dL — ABNORMAL LOW (ref 6.5–8.1)

## 2022-05-18 LAB — CBC WITH DIFFERENTIAL (CANCER CENTER ONLY)
Abs Immature Granulocytes: 0.15 10*3/uL — ABNORMAL HIGH (ref 0.00–0.07)
Basophils Absolute: 0.1 10*3/uL (ref 0.0–0.1)
Basophils Relative: 0 %
Eosinophils Absolute: 0 10*3/uL (ref 0.0–0.5)
Eosinophils Relative: 0 %
HCT: 42.3 % (ref 36.0–46.0)
Hemoglobin: 13.5 g/dL (ref 12.0–15.0)
Immature Granulocytes: 1 %
Lymphocytes Relative: 17 %
Lymphs Abs: 2.6 10*3/uL (ref 0.7–4.0)
MCH: 30.6 pg (ref 26.0–34.0)
MCHC: 31.9 g/dL (ref 30.0–36.0)
MCV: 95.9 fL (ref 80.0–100.0)
Monocytes Absolute: 1.3 10*3/uL — ABNORMAL HIGH (ref 0.1–1.0)
Monocytes Relative: 9 %
Neutro Abs: 10.9 10*3/uL — ABNORMAL HIGH (ref 1.7–7.7)
Neutrophils Relative %: 73 %
Platelet Count: 403 10*3/uL — ABNORMAL HIGH (ref 150–400)
RBC: 4.41 MIL/uL (ref 3.87–5.11)
RDW: 14.9 % (ref 11.5–15.5)
WBC Count: 15 10*3/uL — ABNORMAL HIGH (ref 4.0–10.5)
nRBC: 0 % (ref 0.0–0.2)

## 2022-05-18 LAB — LACTATE DEHYDROGENASE: LDH: 223 U/L — ABNORMAL HIGH (ref 98–192)

## 2022-05-18 NOTE — Progress Notes (Signed)
Initial RN Navigator Patient Visit  Name: Faith Massey Date of Referral : 05/10/2022 Diagnosis: Thymoma  Met with patient prior to their visit with MD. Hanley Seamen patient "Your Patient Navigator" handout which explains my role, areas in which I am able to help, and all the contact information for myself and the office. Also gave patient MD and Navigator business card. Reviewed with patient the general overview of expected course after initial diagnosis and time frame for all steps to be completed.  Patient comes with a friend. She works part time out of her home but has flexibility in the hours she works. She has four children, three of which live close and all are supportive of her. She lives alone but feels like she has a good support network.   Patient has already been seen by RadOnc and is planned for simulation on 05/30/2022.  Patient completed visit with [name]. Will follow up once Dr [name] dictates his office note and places orders.   Patient understands all follow up procedures and expectations. They have my number to reach out for any further clarification or additional needs.    Oncology Nurse Navigator Documentation     05/18/2022   11:00 AM  Oncology Nurse Navigator Flowsheets  Phase of Treatment Radiation  Navigator Follow Up Date: 05/19/2022  Navigator Follow Up Reason: Appointment Review  Navigator Location [facility]  Navigator Encounter Type Initial MedOnc  Patient Visit Type MedOnc  Treatment Phase Active Tx  Barriers/Navigation Needs Coordination of Care;Education  Interventions Education;Psycho-Social Support  Acuity Level 2-Minimal Needs (1-2 Barriers Identified)  Education Method Verbal  Support Groups/Services Friends and Family  Time Spent with Patient 30

## 2022-05-18 NOTE — Progress Notes (Signed)
Referral MD  Reason for Referral: Stage I (T1aN0M0) Thymoma  No chief complaint on file. : I had a tumor removed from the chest.  HPI: Faith Massey is a very nice 77 year old white female.  She is incredibly perky.  It was so enjoyable talking with her.  She has been doing quite well.  However, right after New Year's, she began to feel very tired.  She did not have a lot of energy.  She has some hoarseness.  She had a little bit of a right-sided facial droop.  She had a hard time with word finding.  She had MRI of the brain which showed a subacute infarct in the right hemisphere.  She was subsequently admitted for stroke management.  She has been seen by Neurology.  She, in part of the workup, had a CT angiogram of the head which also included the mediastinum.  This showed a mass in the anterior mediastinum.  A CT of the chest showed a 3 x 4 x 5.3 cm anterior mediastinal mass.  She, was diagnosed with myasthenia gravis.  Again she has been followed by Neurology.  Is felt that she needed to be seen by Thoracic Surgery.  As such, she saw Dr. Roxan Hockey.  It was felt that this mass need to be resected.  She is a presented at [facility].  She subsequently underwent a resection on 04/25/2022.  The pathology report ([id number]) showed a type B2 thymoma.  There is focal extension into the thymic fat.  Margins were focally involved.  The tumor measured 6.1 cm.  There is no lymphovascular space invasion.  3 lymph nodes were resected and were negative.  She was subsequently staged at stage I (T1aN0M0).  She recovered very quickly from surgery.  She feels stronger.  She still has some acetylcholinesterase binding antibodies.  When she was checked in January, she had a level of 27.8.  Normal was less than 0.24.  I am sure that she has had a more recent test.  She has had no problems with her voice.  She has had no cough or shortness of breath.  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  She underwent bilateral mastectomies.  She did not have radiation.  Think it is incredible that she had the mastectomies and did not need radiation.  I think because of the fact that she had the mastectomies, that she can now have adjuvant radiation therapy.  Currently, I would have said that her performance status is probably ECOG 1.    Past Medical History:  Diagnosis Date   Breast CA ([place])    Hyperlipidemia    Hypertension    Myasthenia gravis ([place])    Dx'd 03/2022   Osteoporosis 10/12/2012  :   Past Surgical History:  Procedure Laterality Date   ABDOMINAL HYSTERECTOMY  2005   BACK SURGERY  2000   MASTECTOMY Bilateral 1995  :   Current Outpatient Medications:    aspirin EC 81 MG tablet, Take 1 tablet (81 mg total) by mouth daily. Swallow whole., Disp: 30 tablet, Rfl: 12   cephALEXin (KEFLEX) 250 MG capsule, Take 1 capsule (250 mg total) by mouth 2 (two) times daily., Disp: 14 capsule, Rfl: 0   Cholecalciferol (VITAMIN D-3) 5000 UNITS TABS, Take 5,000 Units by mouth daily., Disp: , Rfl:    COLLAGEN PO, Take 1 Scoop by mouth daily., Disp: , Rfl:    ezetimibe (ZETIA) 10 MG tablet, Take 1 tablet (10  mg total) by mouth daily., Disp: 30 tablet, Rfl: 3   lisinopril (ZESTRIL) 20 MG tablet, Take 1 tablet (20 mg total) by mouth daily., Disp: 30 tablet, Rfl: 1   Multiple Vitamins-Minerals (MULTIVITAMIN WITH MINERALS) tablet, Take 1 tablet by mouth daily., Disp: , Rfl:    predniSONE (DELTASONE) 10 MG tablet, 4 tabs qam xone week, 3.5 tabs qam xone week 3 tabs qam xone week 2.5 tab qam xone week 2 tabs qam, Disp: 120 tablet, Rfl: 3   pyridostigmine (MESTINON) 60 MG tablet, Take 1 tablet (60 mg total) by mouth 3 (three) times daily., Disp: 90 tablet, Rfl: 6   traMADol (ULTRAM) 50 MG tablet, Take 1 tablet (50 mg total) by mouth every 6 (six) hours as needed (mild pain). (Patient not taking: Reported on 05/18/2022), Disp: 28 tablet, Rfl: 0:  :  No Known Allergies:   Family  History  Problem Relation Age of Onset   Stroke Mother    Hypertension Mother    Myasthenia gravis Mother    Hypertension Father   :   Social History   Socioeconomic History   Marital status: Divorced    Spouse name: Not on file   Number of children: Not on file   Years of education: Not on file   Highest education level: Not on file  Occupational History   Not on file  Tobacco Use   Smoking status: Never   Smokeless tobacco: Never  Vaping Use   Vaping Use: Never used  Substance and Sexual Activity   Alcohol use: Yes    Alcohol/week: 1.0 standard drink of alcohol    Types: 1 Glasses of wine per week    Comment: occasional   Drug use: No   Sexual activity: Not Currently  Other Topics Concern   Not on file  Social History Narrative   Not on file   Social Determinants of Health   Financial Resource Strain: Not on file  Food Insecurity: No Food Insecurity (04/26/2022)   Hunger Vital Sign    Worried About Running Out of Food in the Last Year: Never true    Ran Out of Food in the Last Year: Never true  Transportation Needs: No Transportation Needs (04/26/2022)   PRAPARE - Hydrologist (Medical): No    Lack of Transportation (Non-Medical): No  Physical Activity: Not on file  Stress: Not on file  Social Connections: Not on file  Intimate Partner Violence: Not At Risk (04/26/2022)   Humiliation, Afraid, Rape, and Kick questionnaire    Fear of Current or Ex-Partner: No    Emotionally Abused: No    Physically Abused: No    Sexually Abused: No  : Review of Systems  Constitutional: Negative.   HENT: Negative.    Eyes: Negative.   Respiratory: Negative.    Cardiovascular: Negative.   Gastrointestinal: Negative.   Genitourinary: Negative.   Musculoskeletal: Negative.   Skin: Negative.   Neurological: Negative.   Endo/Heme/Allergies: Negative.   Psychiatric/Behavioral: Negative.       Exam: Vital signs show temperature of 97.7.  Pulse 78.   Blood pressure 159/82.  Weight is 112 pounds.  '@IPVITALS'$ @ Physical Exam Vitals reviewed.  Constitutional:      Comments: This is a well-developed and well-nourished white female.  She has laparotomy scars that are well-healed over on the right lateral chest wall.  HENT:     Head: Normocephalic and atraumatic.  Eyes:     Pupils: Pupils are equal, round,  and reactive to light.  Cardiovascular:     Rate and Rhythm: Normal rate and regular rhythm.     Heart sounds: Normal heart sounds.  Pulmonary:     Effort: Pulmonary effort is normal.     Breath sounds: Normal breath sounds.  Abdominal:     General: Bowel sounds are normal.     Palpations: Abdomen is soft.  Musculoskeletal:        General: No tenderness or deformity. Normal range of motion.     Cervical back: Normal range of motion.  Lymphadenopathy:     Cervical: No cervical adenopathy.  Skin:    General: Skin is warm and dry.     Findings: No erythema or rash.  Neurological:     Mental Status: She is alert and oriented to person, place, and time.  Psychiatric:        Behavior: Behavior normal.        Thought Content: Thought content normal.        Judgment: Judgment normal.     Recent Labs    05/18/22 1057  WBC 15.0*  HGB 13.5  HCT 42.3  PLT 403*    Recent Labs    05/18/22 1057  NA 143  K 4.1  CL 106  CO2 28  GLUCOSE 136*  BUN 16  CREATININE 0.69  CALCIUM 9.8    Blood smear review: None  Pathology: See above    Assessment and Plan: Faith Massey is a very charming 77 year old white female.  She had a stage I thymoma.  This was resected.  She had focally involved margins.  There is no role for chemotherapy.  I do realize that type B2 thymoma is can be a little more aggressive.  However, there is been no studies that have really looked at chemotherapy in the adjuvant setting.  However, there is a role for radiation therapy.  She has already seen [name] of Radiation Oncology.  I think that she will  start radiation sometime in March.  It sounds like she will have about 5-6 weeks of radiation.  Again, there is no role for chemotherapy.  This appears to be a thymoma that triggered her myasthenia gravis.  I told her that it could take a good year before she finally starts to feel totally normal again.  I know that Neurology is following her closely and running the acetylcholinesterase antibodies.  From my point of view, we had to follow along with CT scans.  I think we can get a CT scan on her probably sometime in May.  For right now, I will just plan to get her back to see her sometime in April.  I would think that at that point, she should be close to finishing radiation.  I think the outcome should be quite good for Faith Massey.  I would think that with surgery and radiation therapy, her chance of cure should be over 90%.

## 2022-05-19 ENCOUNTER — Encounter: Payer: Self-pay | Admitting: *Deleted

## 2022-05-19 ENCOUNTER — Ambulatory Visit: Payer: Medicare HMO | Admitting: Dietician

## 2022-05-19 NOTE — Progress Notes (Signed)
Nutrition Assessment: Reached out to patient at home telephone number.    Reason for Assessment: New Patient Assessment per protocol   ASSESSMENT: Patient is 77 year old female recently diagnosed with myasthenic syndrome.  She was evaluated by [name] but will not need and chemo or be followed by his team.  She will follow up with radiation oncology.  She has a PMHx that includes CVA, BCA, HTN, and osteoporosis.  She has good support and lots of friends sending food over.  She is on a prednisone taper that has increased appetite and intake.  She had be doing some intermittent fasting but is now eating earlier to take meds.   She reports some attention to chewing and swallowing now with some dental issues last year and MG. Today's intake Cottage cheese with strawberries blueberries, toast with avocado & salsa.  She eats all foods, tries to eat healthy with stir fry vegetables.  Fluids include lots of herbal teas, has some Fair Life Milk She states not sure weight on 04/16/22 is accurate.      Nutrition Focused Physical Exam: unable to perform NFPE   Medications: prednisone, MVI with Vit D   Labs: reviewed 05/18/22   Anthropometrics:   Height: 58" Weight: 05/11/22 UBW: 110# BMI: 22.99    INTERVENTION:   Relayed that nutrition services are wrap around service provided at no charge and encouraged continued communication if experiencing any nutritional impact symptoms (NIS). Encouraged continued adequate nourishment with calorie and protein energy intake  with nutrient dense foods when possible to maintain weight/strength and QOL.   Emailed Nutrition Tip sheet  for  Nutrition for Breast Cancer Survivors with contact information provided.  MONITORING, EVALUATION, GOAL: weight trends, nutrition impact symptoms, PO intake, labs   Next Visit: PRN at patient or provider request.  April Manson, RDN, LDN Registered Dietitian, [place] (Usual office  hours: Tuesday-Thursday) Mobile: [phone]

## 2022-05-19 NOTE — Telephone Encounter (Signed)
[facility]

## 2022-05-19 NOTE — Progress Notes (Signed)
There is no role for chemo in this patient. She will treated with radiation and then surveillance. No additional med onc needs at this time.   Referrals to social work and nutrition placed per protocol.  Patient had questions about obtaining a PCP. She like to see a physician that might have some extra experience or knowledge with MG and her other health conditions. Explained that we work closely with the MD group in our office, but I wasn't sure there would be a provider who had extra focus on MG as they would refer out to specialists. Spoke with Dr [name]. He suggest patient reach out to Dr Lorelei Pont. I spoke with Dr Lorelei Pont and her nurse will call to schedule. Patient is aware.   Oncology Nurse Navigator Documentation     05/19/2022    8:00 AM  Oncology Nurse Navigator Flowsheets  Navigator Follow Up Date: 05/30/2022  Navigator Follow Up Reason: Radiation  Navigator Location [facility]  Navigator Encounter Type Telephone;Appt/Treatment Plan Review  Telephone Incoming Call  Patient Visit Type MedOnc  Treatment Phase Active Tx  Barriers/Navigation Needs Coordination of Care;Education  Education Other  Interventions Education;Psycho-Social Support;Referrals  Acuity Level 2-Minimal Needs (1-2 Barriers Identified)  Referrals Nutrition/dietician;Social Work  Chiropodist Groups/Services Friends and Family  Time Spent with Patient 30

## 2022-05-20 ENCOUNTER — Inpatient Hospital Stay: Payer: Medicare HMO | Attending: Hematology & Oncology

## 2022-05-20 ENCOUNTER — Telehealth: Payer: Self-pay

## 2022-05-20 ENCOUNTER — Telehealth: Payer: Self-pay | Admitting: Radiation Oncology

## 2022-05-20 ENCOUNTER — Other Ambulatory Visit: Payer: Self-pay | Admitting: Physician Assistant

## 2022-05-20 MED ORDER — TRAMADOL HCL 50 MG PO TABS: 50.0000 mg | ORAL_TABLET | Freq: Four times a day (QID) | ORAL | 0 refills | Status: DC | PRN

## 2022-05-20 NOTE — Telephone Encounter (Signed)
Faith Massey called the office requesting a Tramadol pain medication refill. She is s/p Thymectomy with Dr. Roxan Hockey 2/5 and was d/c on the 2/7. She has not requested a refill until now. She states that her neck and chest area are hurting (aching and stabbing). She has tried to just take Tylenol and Ibuprofen with no relief. She says that she is taking 1-2 pills a day, mainly at night with maybe one during the day. She has 2-3 pills left. She requests it to be [facility] to [facility] Enid Cutter, PA approved refill and sent electronically to pharmacy. Patient aware and acknowledged receipt.

## 2022-05-20 NOTE — Telephone Encounter (Signed)
Called patient to discuss signing up for the [employer].  She said she was busy with company and would call me back.

## 2022-05-20 NOTE — Progress Notes (Signed)
[place] Work  Initial Assessment   Faith Massey is a 77 y.o. year old female contacted by phone. Clinical Social Work was referred by nurse navigator for assessment of psychosocial needs.   SDOH (Social Determinants of Health) assessments performed: Yes SDOH Interventions    Flowsheet Row Clinical Support from 05/20/2022 in [place] at [facility]  SDOH Interventions   Financial Strain Interventions Financial Counselor  Social Connections Interventions Intervention Not Indicated       SDOH Screenings   Food Insecurity: No Food Insecurity (04/26/2022)  Housing: Low Risk  (04/26/2022)  Transportation Needs: No Transportation Needs (04/26/2022)  Utilities: Not At Risk (04/26/2022)  Depression (PHQ2-9): Low Risk  (05/12/2022)  Financial Resource Strain: Medium Risk (05/20/2022)  Social Connections: Moderately Integrated (05/20/2022)  Tobacco Use: Low Risk  (05/12/2022)     Distress Screen completed: No     No data to display            Family/Social Information:  Housing Arrangement: patient lives alone.  She has a rescue dog also. Family members/support persons in your life? Family and Friends.  They provide food and transporation. Transportation concerns: no  Employment: Retired  Income source: Paediatric nurse concerns: Yes, due to illness and/or loss of work during treatment Type of concern: Medical bills Food access concerns: no Religious or spiritual practice: Yes Services Currently in place:  [employer]  Coping/ Adjustment to diagnosis: Patient understands treatment plan and what happens next? yes Concerns about diagnosis and/or treatment:  Patient expressed concern over financial strain. Patient reported stressors: Veterinary surgeon and/or priorities: Patient places her faith in the [place]. Patient enjoys time with family/ friends Current coping skills/ strengths: Active sense of humor , Average or above average  intelligence , Capable of independent living , Communication skills , General fund of knowledge , Motivation for treatment/growth , Religious Affiliation , and Supportive family/friends     SUMMARY: Current SDOH Barriers:  Financial constraints related to fixed income.  Clinical Social Work Clinical Goal(s):  Freight forwarder options for unmet needs related to:  Financial Strain   Interventions: Discussed common feeling and emotions when being diagnosed with cancer, and the importance of support during treatment Informed patient of the support team roles and support services at [facility] Provided [place] contact information and encouraged patient to call with any questions or concerns Provided patient with information about the [employer] and made referral.  Also securly emailed patient information about free massage and [employer] and [employer].   Follow Up Plan: CSW will follow-up with patient by phone  Patient verbalizes understanding of plan: Yes    Rodman Pickle Sam Wunschel, LCSW

## 2022-05-20 NOTE — Progress Notes (Unsigned)
05/20/22 12:02 Tramadol Rx renewed for '50mg'$  po q6h prn pain #28 with no refill.    Macarthur Critchley, PA-C

## 2022-05-21 NOTE — Progress Notes (Unsigned)
[place] at [facility] [address], [place], [place] [address] [phone]  Date:  05/25/2022   Name:  Faith Massey   DOB:  May 02, 1945   MRN:  [id number]  PCP:  Patient, No Pcp Per    Chief Complaint: No chief complaint on file.   History of Present Illness:  Faith Massey is a 77 y.o. very pleasant female patient who presents with the following:  Seen today as a new patient to establish primary care on request of her oncologist [name]  History of hypertension, CVA, thymoma, remote breast cancer 1995, hyperlipidemia, osteoporosis It appears that she was doing just fine until January of this year at which time she felt very tired in addition to right-sided facial droop and difficulty with word finding.  An MRI of the brain showed a subacute stroke, she is now seeing neurology She was also diagnosed with myasthenia gravis by neurology During her stroke workup a mediastinal mass was found-Dr. Roxan Hockey did a resection on February 5 which revealed a thymoma  Per most recent hematology note dated 2/28: She had a stage I thymoma.  This was resected.  She had focally involved margins. There is no role for chemotherapy.  I do realize that type B2 thymoma is can be a little more aggressive.  However, there is been no studies that have really looked at chemotherapy in the adjuvant setting.  However, there is a role for radiation therapy.  She has already seen [name] of Radiation Oncology.  I think that she will start radiation sometime in March.  It sounds like she will have about 5-6 weeks of radiation  Patient Active Problem List   Diagnosis Date Noted   Type B2 thymoma ([place]) 05/12/2022   S/P Robotic Assisted Right Video Thoracoscopy with resection of Thymus 04/25/2022   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   Myasthenic syndrome ([place]) 04/12/2022   CVA (cerebral vascular accident) ([place]) 03/28/2022   HTN (hypertension) 03/28/2022   Chest mass  03/28/2022   Right knee injury 02/14/2017   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 10/16/2012   Osteoporosis 10/12/2012    Past Medical History:  Diagnosis Date   Breast CA ([place])    Hyperlipidemia    Hypertension    Myasthenia gravis ([place])    Dx'd 03/2022   Osteoporosis 10/12/2012    Past Surgical History:  Procedure Laterality Date   ABDOMINAL HYSTERECTOMY  2005   BACK SURGERY  2000   MASTECTOMY Bilateral 1995    Social History   Tobacco Use   Smoking status: Never   Smokeless tobacco: Never  Vaping Use   Vaping Use: Never used  Substance Use Topics   Alcohol use: Yes    Alcohol/week: 1.0 standard drink of alcohol    Types: 1 Glasses of wine per week    Comment: occasional   Drug use: No    Family History  Problem Relation Age of Onset   Stroke Mother    Hypertension Mother    Myasthenia gravis Mother    Hypertension Father     No Known Allergies  Medication list has been reviewed and updated.  Current Outpatient Medications on File Prior to Visit  Medication Sig Dispense Refill   aspirin EC 81 MG tablet Take 1 tablet (81 mg total) by mouth daily. Swallow whole. 30 tablet 12   cephALEXin (KEFLEX) 250 MG capsule Take 1 capsule (250 mg total) by mouth 2 (two) times  daily. 14 capsule 0   Cholecalciferol (VITAMIN D-3) 5000 UNITS TABS Take 5,000 Units by mouth daily.     COLLAGEN PO Take 1 Scoop by mouth daily.     ezetimibe (ZETIA) 10 MG tablet Take 1 tablet (10 mg total) by mouth daily. 30 tablet 3   lisinopril (ZESTRIL) 20 MG tablet Take 1 tablet (20 mg total) by mouth daily. 30 tablet 1   Multiple Vitamins-Minerals (MULTIVITAMIN WITH MINERALS) tablet Take 1 tablet by mouth daily.     predniSONE (DELTASONE) 10 MG tablet 4 tabs qam xone week, 3.5 tabs qam xone week 3 tabs qam xone week 2.5 tab qam xone week 2 tabs qam 120 tablet 3   pyridostigmine (MESTINON) 60 MG tablet Take 1 tablet (60 mg total) by mouth 3 (three) times daily. 90 tablet 6    traMADol (ULTRAM) 50 MG tablet Take 1 tablet (50 mg total) by mouth every 6 (six) hours as needed (mild pain). 28 tablet 0   No current facility-administered medications on file prior to visit.    Review of Systems:  As per HPI- otherwise negative.   Physical Examination: There were no vitals filed for this visit. There were no vitals filed for this visit. There is no height or weight on file to calculate BMI. Ideal Body Weight:    GEN: no acute distress. HEENT: Atraumatic, Normocephalic.  Ears and Nose: No external deformity. CV: RRR, No M/G/R. No JVD. No thrill. No extra heart sounds. PULM: CTA B, no wheezes, crackles, rhonchi. No retractions. No resp. distress. No accessory muscle use. ABD: S, NT, ND, +BS. No rebound. No HSM. EXTR: No c/c/e PSYCH: Normally interactive. Conversant.    Assessment and Plan: ***  Signed Lamar Blinks, MD

## 2022-05-24 ENCOUNTER — Other Ambulatory Visit: Payer: Self-pay | Admitting: Thoracic Surgery (Cardiothoracic Vascular Surgery)

## 2022-05-24 DIAGNOSIS — J9859 Other diseases of mediastinum, not elsewhere classified: Secondary | ICD-10-CM

## 2022-05-25 ENCOUNTER — Ambulatory Visit (HOSPITAL_BASED_OUTPATIENT_CLINIC_OR_DEPARTMENT_OTHER)
Admission: RE | Admit: 2022-05-25 | Discharge: 2022-05-25 | Disposition: A | Discharge status: Home / Self Care | Payer: Medicare HMO | Source: Ambulatory Visit | Attending: Family Medicine | Admitting: Family Medicine

## 2022-05-25 ENCOUNTER — Encounter: Payer: Self-pay | Admitting: Thoracic Surgery (Cardiothoracic Vascular Surgery)

## 2022-05-25 ENCOUNTER — Ambulatory Visit (INDEPENDENT_AMBULATORY_CARE_PROVIDER_SITE_OTHER): Payer: Self-pay | Admitting: Thoracic Surgery (Cardiothoracic Vascular Surgery)

## 2022-05-25 ENCOUNTER — Encounter: Payer: Self-pay | Admitting: Family Medicine

## 2022-05-25 ENCOUNTER — Ambulatory Visit (INDEPENDENT_AMBULATORY_CARE_PROVIDER_SITE_OTHER): Payer: Medicare HMO | Admitting: Family Medicine

## 2022-05-25 ENCOUNTER — Ambulatory Visit (HOSPITAL_BASED_OUTPATIENT_CLINIC_OR_DEPARTMENT_OTHER)
Admission: RE | Admit: 2022-05-25 | Discharge: 2022-05-25 | Disposition: A | Discharge status: Home / Self Care | Payer: Medicare HMO | Source: Ambulatory Visit | Attending: Thoracic Surgery (Cardiothoracic Vascular Surgery) | Admitting: Thoracic Surgery (Cardiothoracic Vascular Surgery)

## 2022-05-25 VITALS — BP 161/77 | HR 97 | Resp 20 | Ht <= 58 in | Wt 111.0 lb

## 2022-05-25 VITALS — BP 150/90 | HR 86 | Temp 98.3°F | Resp 18 | Ht <= 58 in | Wt 111.6 lb

## 2022-05-25 DIAGNOSIS — I10 Essential (primary) hypertension: Secondary | ICD-10-CM | POA: Diagnosis not present

## 2022-05-25 DIAGNOSIS — Z23 Encounter for immunization: Secondary | ICD-10-CM | POA: Diagnosis not present

## 2022-05-25 DIAGNOSIS — Z1211 Encounter for screening for malignant neoplasm of colon: Secondary | ICD-10-CM

## 2022-05-25 DIAGNOSIS — M542 Cervicalgia: Secondary | ICD-10-CM | POA: Diagnosis not present

## 2022-05-25 DIAGNOSIS — R7303 Prediabetes: Secondary | ICD-10-CM | POA: Diagnosis not present

## 2022-05-25 DIAGNOSIS — M47812 Spondylosis without myelopathy or radiculopathy, cervical region: Secondary | ICD-10-CM

## 2022-05-25 DIAGNOSIS — R918 Other nonspecific abnormal finding of lung field: Secondary | ICD-10-CM | POA: Diagnosis not present

## 2022-05-25 DIAGNOSIS — R5383 Other fatigue: Secondary | ICD-10-CM

## 2022-05-25 DIAGNOSIS — G7000 Myasthenia gravis without (acute) exacerbation: Secondary | ICD-10-CM

## 2022-05-25 DIAGNOSIS — C37 Malignant neoplasm of thymus: Secondary | ICD-10-CM | POA: Diagnosis not present

## 2022-05-25 DIAGNOSIS — E2839 Other primary ovarian failure: Secondary | ICD-10-CM | POA: Diagnosis not present

## 2022-05-25 DIAGNOSIS — J9859 Other diseases of mediastinum, not elsewhere classified: Secondary | ICD-10-CM | POA: Insufficient documentation

## 2022-05-25 DIAGNOSIS — Z09 Encounter for follow-up examination after completed treatment for conditions other than malignant neoplasm: Secondary | ICD-10-CM

## 2022-05-25 HISTORY — DX: Prediabetes: R73.03

## 2022-05-25 MED ORDER — LISINOPRIL 20 MG PO TABS: 30.0000 mg | ORAL_TABLET | Freq: Every day | ORAL | 2 refills | Status: DC

## 2022-05-25 NOTE — Progress Notes (Signed)
[name]     HPI: Faith Massey returns for a scheduled follow-up visit after recent thymectomy.  Faith Massey is a 77 year old woman with a history of hypertension, hyperlipidemia, breast cancer, osteoporosis, myasthenia gravis, and a thymoma.  She presented initially with myasthenia symptoms.  CT of the chest showed a mediastinal mass.  Her acetylcholine receptor antibody titers were positive.  She underwent robotic assisted thymectomy on 04/25/2022.  She went home on postoperative day #2.  I saw her back in the office a couple of weeks ago.  She was complaining of feeling more short of breath with cough and some congestion.  Did a CT angio to rule out PE and there was no evidence of a pulmonary embolus.  Consistent with bronchitis.  Still has some shortness of breath with activity.  Seems to be getting better.  Not having much in the way of incisional pain but has had some neck discomfort.  She saw [name] and [name].  Past Medical History:  Diagnosis Date   Breast CA ([name])    Hyperlipidemia    Hypertension    Myasthenia gravis ([name])    Dx'd 03/2022   Osteoporosis 10/12/2012    Current Outpatient Medications  Medication Sig Dispense Refill   aspirin EC 81 MG tablet Take 1 tablet (81 mg total) by mouth daily. Swallow whole. 30 tablet 12   Cholecalciferol (VITAMIN D-3) 5000 UNITS TABS Take 5,000 Units by mouth daily.     COLLAGEN PO Take 1 Scoop by mouth daily.     ezetimibe (ZETIA) 10 MG tablet Take 1 tablet (10 mg total) by mouth daily. 30 tablet 3   lisinopril (ZESTRIL) 20 MG tablet Take 1.5 tablets (30 mg total) by mouth daily. 145 tablet 2   Multiple Vitamins-Minerals (MULTIVITAMIN WITH MINERALS) tablet Take 1 tablet by mouth daily.     predniSONE (DELTASONE) 10 MG tablet 4 tabs qam xone week, 3.5 tabs qam xone week 3 tabs qam xone week 2.5 tab qam xone week 2 tabs qam 120 tablet 3    pyridostigmine (MESTINON) 60 MG tablet Take 1 tablet (60 mg total) by mouth 3 (three) times daily. 90 tablet 6   traMADol (ULTRAM) 50 MG tablet Take 1 tablet (50 mg total) by mouth every 6 (six) hours as needed (mild pain). 28 tablet 0   No current facility-administered medications for this visit.    Physical Exam BP (!) 161/77 (BP Location: Right Arm, Patient Position: Sitting)   Pulse 97   Resp 20   Ht '4\' 10"'$  (1.473 m)   Wt 111 lb (50.3 kg)   SpO2 95% Comment: RA  BMI 23.50 kg/m  77 year old woman in no acute distress Alert and oriented x 3 Lungs clear with good breath sounds bilaterally Tenderness along right trapezius Cardiac regular rate and rhythm  Diagnostic Tests: I personally reviewed her chest x-ray.  No active disease.  Impression: Faith Massey is a 77 year old woman with a history of hypertension, hyperlipidemia, breast cancer, osteoporosis, myasthenia gravis, and a thymoma.  Thymoma-type B2.  Status post resection.  Complete resection grossly but microscopic extracapsular extension.  She saw [name] and he recommended adjuvant radiation.  She is complaining of some pain along the right side of her neck.  She had a C-spine x-ray but it has not been read yet.  I  suspect this is mostly musculoskeletal and would respond to the usual remedies.  There are no treatments that would be contraindicated from my standpoint  No restrictions on her activities from my standpoint.  Plan: Follow-up with Dr. [name] in [place] as scheduled I will plan to see her back in 6 months to check on her progress She knows to call if she has any issues with which I can be of assistance with the meantime.  Melrose Nakayama, MD [facility] [phone]

## 2022-05-25 NOTE — Patient Instructions (Addendum)
Good to see you today You do have pre-diabetes; I am hoping this will improve once you finish the prednisone and get back to your normal exercise routine, but we will keep an eye on it  Lets increase your lisinopril to 30 mg daily to bring your blood pressure down a bit  For sleep- try some OTC melatonin, and you can also try some benadryl if needed  If you are still having difficulty sleeping I am glad to send in a prescription for you  I will order a bone density scan to look for osteoporosis, this can be done at your convenience  I would recommend catching up on immunizations such as pneumonia, shingles, tetanus once you complete radiation treatment  We cannot find your Cologuard report online.  Please see if you can find this record for [place] and let us know the date of your last Cologuard  I ordered plain x-rays of your neck, these can be done at [facility] at your convenience.  I think massage may be helpful if Dr. Koleen Nimrod thinks you have recovered sufficiently

## 2022-05-26 ENCOUNTER — Telehealth (HOSPITAL_BASED_OUTPATIENT_CLINIC_OR_DEPARTMENT_OTHER): Payer: Self-pay

## 2022-05-27 ENCOUNTER — Telehealth: Payer: Self-pay | Admitting: Neurology

## 2022-05-27 DIAGNOSIS — D4989 Neoplasm of unspecified behavior of other specified sites: Secondary | ICD-10-CM

## 2022-05-27 DIAGNOSIS — G709 Myoneural disorder, unspecified: Secondary | ICD-10-CM

## 2022-05-27 NOTE — Telephone Encounter (Signed)
Patient called answering service stating that she got heart burn and palpitation since being on the steroids. Advised her to start PPI, I will update Dr. Krista Blue and office will contact you if they have additional recs.

## 2022-05-29 ENCOUNTER — Encounter: Payer: Self-pay | Admitting: Family Medicine

## 2022-05-29 NOTE — Progress Notes (Incomplete)
Radiation Oncology         [phone] ________________________________  Name: Rehab Diersen MRN: [id number]  Date: 05/30/2022  DOB: 1946/01/24  Follow-Up Visit Note  CC: Copland, Gay Filler, MD  Copland, Gay Filler, MD  No diagnosis found.  Diagnosis: The encounter diagnosis was Type B2 thymoma ([place]).   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 Presented with stroke like symptoms in January 2024. Work-up incdentally revealed an anterior mediastinal mass and myasthenia gravis.   Narrative:  The patient returns today for follow-up prior to [facility].   Since the patient was seen in consultation on 05/12/22, the patient met with [name] on 05/18/22. Per [name], there is no role for chemotherapy. However, [name] would like see her again in April for repeat imaging and follow-up.   The patient also recently followed up with Dr. Roxan Hockey on 05/25/22. During which time, the patient reported improvement in her exertional SOB. She also endorsed some right sided neck pain. C-spine x-ray performed on the date of this visit showed significant mid cervical degenerative changes but no evidence of acute abnormality. She will continue to follow-up with Dr. Roxan Hockey every 6 months to assess her healing progress. (She also had a chest-xray performed on 05/25/22 which showed no evidence of acute cardiopulmonary process).            ***                   Allergies:  has No Known Allergies.  Meds: Current Outpatient Medications  Medication Sig Dispense Refill   aspirin EC 81 MG tablet Take 1 tablet (81 mg total) by mouth daily. Swallow whole. 30 tablet 12   Cholecalciferol (VITAMIN D-3) 5000 UNITS TABS Take 5,000 Units by mouth daily.     COLLAGEN PO Take 1 Scoop by mouth daily.     ezetimibe (ZETIA) 10 MG tablet Take 1 tablet (10 mg total) by mouth daily. 30 tablet 3   lisinopril (ZESTRIL) 20 MG tablet Take 1.5 tablets (30 mg total) by mouth  daily. 145 tablet 2   Multiple Vitamins-Minerals (MULTIVITAMIN WITH MINERALS) tablet Take 1 tablet by mouth daily.     predniSONE (DELTASONE) 10 MG tablet 4 tabs qam xone week, 3.5 tabs qam xone week 3 tabs qam xone week 2.5 tab qam xone week 2 tabs qam 120 tablet 3   pyridostigmine (MESTINON) 60 MG tablet Take 1 tablet (60 mg total) by mouth 3 (three) times daily. 90 tablet 6   traMADol (ULTRAM) 50 MG tablet Take 1 tablet (50 mg total) by mouth every 6 (six) hours as needed (mild pain). 28 tablet 0   No current facility-administered medications for this encounter.    Physical Findings: The patient is in no acute distress. Patient is alert and oriented.  vitals were not taken for this visit. .  No significant changes. Lungs are clear to auscultation bilaterally. Heart has regular rate and rhythm. No palpable cervical, supraclavicular, or axillary adenopathy. Abdomen soft, non-tender, normal bowel sounds.   Lab Findings: Lab Results  Component Value Date   WBC 15.0 (H) 05/18/2022   HGB 13.5 05/18/2022   HCT 42.3 05/18/2022   MCV 95.9 05/18/2022   PLT 403 (H) 05/18/2022    Radiographic Findings: DG Cervical Spine Complete  Result Date: 05/28/2022 CLINICAL DATA:  Cervical pain since mediastinal mass surgery on 04/25/2021. No known injury. EXAM: CERVICAL SPINE - COMPLETE 4+ VIEW COMPARISON:  None Available. FINDINGS:  There is significant mid cervical degenerative change, with loss of disc height at C3-4, C4-5, C5-6, and C6-7. There is significant sclerosis at C4-5. There is no acute fracture or subluxation. Prevertebral soft tissues are normal in appearance. Lung apices are clear. IMPRESSION: Significant mid cervical degenerative changes. No evidence for acute abnormality. Electronically Signed   By: Nolon Nations M.D.   On: 05/28/2022 10:51   DG Chest 2 View  Result Date: 05/25/2022 CLINICAL DATA:  Mediastinal mass, surgery on 04/25/2021. EXAM: CHEST - 2 VIEW COMPARISON:  Chest  radiograph and CT 05/10/2021. FINDINGS: The cardiomediastinal silhouette is normal. There is no focal consolidation or pulmonary edema. There is no pleural effusion or pneumothorax There is no acute osseous abnormality. IMPRESSION: No radiographic evidence of acute cardiopulmonary process. Electronically Signed   By: Valetta Mole M.D.   On: 05/25/2022 14:59   CT Angio Chest Pulmonary Embolism (PE) W or WO Contrast  Result Date: 05/10/2022 CLINICAL DATA:  Pulmonary embolism (PE) suspected, high prob. Recent thymectomy on 04/25/2022 EXAM: CT ANGIOGRAPHY CHEST WITH CONTRAST TECHNIQUE: Multidetector CT imaging of the chest was performed using the standard protocol during bolus administration of intravenous contrast. Multiplanar CT image reconstructions and MIPs were obtained to evaluate the vascular anatomy. RADIATION DOSE REDUCTION: This exam was performed according to the departmental dose-optimization program which includes automated exposure control, adjustment of the mA and/or kV according to patient size and/or use of iterative reconstruction technique. CONTRAST:  75m ISOVUE-370 IOPAMIDOL (ISOVUE-370) INJECTION 76% COMPARISON:  CT 03/28/2022. FINDINGS: Cardiovascular: Satisfactory opacification of the pulmonary arteries to the segmental level. No evidence of pulmonary embolism. Thoracic aorta is nonaneurysmal. Scattered aortic atherosclerotic calcification. Normal heart size. No pericardial effusion. Mediastinum/Nodes: Previously seen anterior mediastinal mass has been resected. No residual mass or fluid collection within the anterior mediastinum. No axillary, mediastinal, or hilar lymphadenopathy. Thyroid, trachea, and esophagus within normal limits. Lungs/Pleura: Mild bibasilar atelectasis. No pleural effusion or pneumothorax. Upper Abdomen: No acute abnormality. Musculoskeletal: Bilateral breast prostheses. Multilevel thoracic spondylosis. No new or acute bony findings. Review of the MIP images confirms  the above findings. IMPRESSION: 1. No evidence of pulmonary embolism or other acute intrathoracic process. 2. Previously seen anterior mediastinal mass has been resected. No residual mass or fluid collection within the anterior mediastinum. 3. Aortic atherosclerosis (ICD10-I70.0). Electronically Signed   By: NDavina PokeD.O.   On: 05/10/2022 15:49   DG Chest 2 View  Result Date: 05/10/2022 CLINICAL DATA:  Provided history: Anterior mediastinal mass. EXAM: CHEST - 2 VIEW COMPARISON:  Prior chest radiographs 04/27/2022 and earlier. Chest CT 03/28/2022. FINDINGS: Heart size within normal limits. 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 Peribronchial thickening. Minimal atelectasis within the right lung base. Suspected trace left pleural effusion. No evidence of pneumothorax. No acute bony abnormality identified. Dextrocurvature of the mid to lower thoracic spine. Thoracolumbar spondylosis. IMPRESSION: 1. 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 2. Peribronchial thickening, a finding which may be due to viral infection, reactive airway disease/asthma other causes of chronic airway inflammation. 3. Minimal atelectasis within the right lung base. 4. Suspected trace left pleural effusion, unchanged. Electronically Signed   By: KKellie SimmeringD.O.   On: 05/10/2022 09:33    Impression: The encounter diagnosis was Type B2 thymoma ([place].   Type B2 thymoma with focal extension into the thymic fat and focally  involved margins. Presented with stroke like symptoms in January 2024. Work-up incdentally revealed an anterior mediastinal mass and myasthenia gravis.   The patient is recovering from the effects of radiation.  ***  Plan:  ***   *** minutes of total time was spent for this patient encounter, including preparation, face-to-face counseling with  the patient and coordination of care, physical exam, and documentation of the encounter. ____________________________________  Blair Promise, PhD, MD  This document serves as a record of services personally performed by Gery Pray, MD. It was created on his behalf by Roney Mans, a trained medical scribe. The creation of this record is based on the scribe's personal observations and the provider's statements to them. This document has been checked and approved by the attending provider.

## 2022-05-30 ENCOUNTER — Ambulatory Visit
Admission: RE | Admit: 2022-05-30 | Discharge: 2022-05-30 | Disposition: A | Discharge status: Home / Self Care | Payer: Medicare HMO | Source: Ambulatory Visit | Attending: Radiation Oncology | Admitting: Radiation Oncology

## 2022-05-30 ENCOUNTER — Telehealth: Payer: Self-pay | Admitting: Neurology

## 2022-05-30 ENCOUNTER — Encounter: Payer: Self-pay | Admitting: *Deleted

## 2022-05-30 DIAGNOSIS — Z510 Encounter for antineoplastic radiation therapy: Secondary | ICD-10-CM | POA: Diagnosis not present

## 2022-05-30 DIAGNOSIS — C37 Malignant neoplasm of thymus: Secondary | ICD-10-CM | POA: Insufficient documentation

## 2022-05-30 DIAGNOSIS — G709 Myoneural disorder, unspecified: Secondary | ICD-10-CM

## 2022-05-30 MED ORDER — HYDROXYZINE HCL 10 MG PO TABS: ORAL_TABLET | ORAL | 3 refills | Status: DC

## 2022-05-30 NOTE — Telephone Encounter (Signed)
I called patient, she just started prednisone 5 mg 3 tablets today, complains side effect of heartburn, swelling, continue have difficulty breathing, also has difficulty sleeping  Today was told need radiation therapy because the thymoma margin was not clear,  Hydroxyzine 10 mg 1 to 2 tablets as needed for sleep,  Has follow-up pending on March 15,

## 2022-05-30 NOTE — Telephone Encounter (Signed)
Pt said medication reaction to  predniSONE (DELTASONE) 10 MG tablet , heartburn (tums did not help), chest pain,, trouble sleeping, feet and ankle swelling, difficulty breathing, metallic taste. Would like a call back.

## 2022-05-30 NOTE — Addendum Note (Signed)
Addended by: Marcial Pacas on: 05/30/2022 05:56 PM   Modules accepted: Orders

## 2022-05-30 NOTE — Progress Notes (Signed)
Patient seen by RadOnc and plan for radiation to start on 06/09/22.  Oncology Nurse Navigator Documentation     05/30/2022    2:00 PM  Oncology Nurse Navigator Flowsheets  Phase of Treatment Radiation  Radiation Actual Start Date: 06/09/2022  Navigator Follow Up Date: 07/14/2022  Navigator Follow Up Reason: Follow-up Appointment;Chemotherapy  Navigator Location [facility]  Navigator Encounter Type Appt/Treatment Plan Review  Patient Visit Type MedOnc  Treatment Phase Active Tx  Barriers/Navigation Needs Coordination of Care;Education  Interventions None Required  Acuity Level 2-Minimal Needs (1-2 Barriers Identified)  Support Groups/Services Friends and Family  Time Spent with Patient 15

## 2022-06-03 ENCOUNTER — Encounter: Payer: Self-pay | Admitting: Neurology

## 2022-06-03 ENCOUNTER — Ambulatory Visit: Payer: Medicare HMO | Admitting: Neurology

## 2022-06-03 VITALS — BP 178/93 | HR 77 | Ht <= 58 in | Wt 109.5 lb

## 2022-06-03 DIAGNOSIS — G709 Myoneural disorder, unspecified: Secondary | ICD-10-CM

## 2022-06-03 DIAGNOSIS — D4989 Neoplasm of unspecified behavior of other specified sites: Secondary | ICD-10-CM

## 2022-06-03 NOTE — Progress Notes (Signed)
Chief Complaint  Patient presents with   Room 13    Pt is here with her Best friend.  Pt states that she has a Music therapist with the prednisone. Pt states that she can barely hold her head up. Pt states that her foot had swollen. Pt states that she has heart burn. Pt states that she is unbalanced. Pt states that she has trouble sleeping. Pt states that she has trouble breathing.       ASSESSMENT AND PLAN  Faith Massey is a 77 y.o. female  Seropositive generalized myasthenia gravis,  Presenting with lack of stamina, excessive fatigue, shortness of breath with exertion since [facility] 2023 r, also developed fatigable right ptosis, hoarse voice head drop at her worst,  Diagnosis is confirmed by positive acetylcholine receptor binding, blocking antibody,  CT of the chest showed soft tissue mass at the right anterior mediastinum, 2.3 x 4 x 5.3 cm, likely represents adenoma or other thymic pathology,   Status post robotic thyroidectomy by Dr. 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, will start radiation therapy March, daily for 6 weeks,  She was doing very well postsurgically, no longer have head drop,  Worsening myasthenia gravis symptoms postsurgically, that concurrent with her upper respiratory infection,  Was started on prednisone tapering 40 mg daily since May 12, 2022, 5 mg decrement every week,  Noticeable moderate proximal upper and lower extremity weakness, including neck flexion weakness,  Repreauthorization for IVIG, 2 g/kg loading dose followed by 1 g/kg maintenance dose   Return To Clinic   In 3 Months    DIAGNOSTIC DATA (LABS, IMAGING, TESTING) - I reviewed patient records, labs, notes, testing and imaging myself where available.   MEDICAL HISTORY:  Faith Massey is a 77 year old female, accompanied by her sister Manuela Schwartz, seen in request by Dr. Garvin Fila, for evaluation of myasthenia gravis,    I reviewed and summarized the referring note.PMHX HTN HLD Breast Cancer, s/p double mastectomy in 1990  Patient lives in independent living, very active, has not seen her primary care since beginning of 2023,  In December 2023, she noticed lack of stamina, easily fatigued, shortness of breath walking her dog walking up hills,  Woke up March 25, 2022, noticed right ptosis, intermittent hoarse voice, which has been persistent that was noticed by her church friend, and family members, who urged her to call primary care on January 8, who directed her to emergency room, she had a stroke evaluation, personally reviewed MRI of the brain March 28, 2022, 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 Leonie Man on March 31, 2022, she was noted to have right ptosis, mild bulbar weakness,  Acetylcholine receptor antibody was positive, blocking 51, binding 27.8, confirmed the diagnosis of myasthenia gravis, she has pending appointment with cardiothoracic surgeon Dr. Roxan Hockey on April 13, 2022  Rest of the laboratory evaluation showed normal CPK TSH, A1c 5.7, lipid panel LDL of 125, CBC WBC was elevated 12, normal hemoglobin of 13, CMP showed calcium 8.7, normal kidney function  Also discussed with her son Annie Main over the phone in detail about  her diagnosis,  UPDATE May 11 2022: She is companied by her daughter at today's clinical visit, status post robotic assisted thyroidectomy by Dr. Roxan Hockey on April 25, 2022, Her postoperative course was uncomplicated and she went home on day 2.,  Recovered very well, pathology showed benign thymoma  A. THYMUS, THYMECTOMY:  -  Thymoma, type B2  - Focal extension into thymic fat present  - Margins appear focally involved  - See oncology table and comment   B. LYMPH NODE, MEDIASTINAL, BIOPSY:  - Three benign lymph nodes (0/3)   She was seen by Dr. Roxan Hockey on May 10, 2022, she began to complains cough congestion on May 07, 2022, no fever,  CT angiogram of chest chest on May 10 2022:  1. No evidence of pulmonary embolism or other acute intrathoracic process. 2. Previously seen anterior mediastinal mass has been resected. No residual mass or fluid collection within the anterior mediastinum. 3. Aortic atherosclerosis (ICD10-I70.0).  She was doing very well postsurgically, prior to surgery, she has had drop, shortness of breath with minimal exertion, mild dysphagia, has to be chew carefully, intermittent ptosis,  Few days after surgery, she has significant improvement but concurrent with her upper respiratory symptoms, she noticed worsening myasthenia gravis symptoms, more shortness of breath, difficult to lie flat, has been sleeping in a sitting position over the past couple days, denies fever, denied double vision, no significant head drop,  She is taking Mestinon 60 mg 3 times daily  UPDATE June 03 2022: She is accompanied by her friend at today's visit, was started on prednisone tapering 40 mg daily since May 12, 2022, 5 mg decrement every week, currently on 30 mg daily, complains of side effect metallic taste in her mouth, moody, worsening glucose, A1c was 6.1  Her muscle strength showed some improvement, she can sleep in her bed now, but continue noticeable upper and lower extremity weakness, difficulty raising arm overhead to her cabinet, get tired easily, losing balance, mild swallowing difficulty if she chew too fast, denied double vision  Pathology of thymus showed 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  Evaluated by radiation oncologist  [name] on May 30 2022, proceed with radiation daily for 6 weeks    Physical examinations:   Vitals:   06/03/22 0900  BP: (!) 178/93  Pulse: 77  Weight: 109 lb 8 oz (49.7 kg)  Height: 4\' 10"  (1.473 m)   Body mass index is 22.89 kg/m.  PHYSICAL EXAMNIATION:  Gen: NAD, conversant, well nourised, well groomed                     Cardiovascular: Regular rate rhythm, no peripheral edema, warm, nontender. Eyes: Conjunctivae clear without exudates or hemorrhage Neck: Supple, no carotid bruits. Pulmonary: Clear to auscultation bilaterally   NEUROLOGICAL EXAM:  MENTAL STATUS: Speech/cognition: Awake, alert, oriented to history taking and casual conversation CRANIAL NERVES: CN II: Visual fields are full to confrontation. Pupils are round equal and briskly reactive to light. CN III, IV, VI: extraocular movement are normal.  No ptosis, extraocular movement abnormality noted, CN V: Facial sensation is intact to light touch CN VII: No significant eye closure, cheek puff weakness, CN VIII: Hearing is normal to causal conversation. CN IX, X: Phonation is normal. CN XI: Head turning and shoulder shrug are intact  MOTOR: Mild neck flexion, shoulder abduction, external rotation, elbow flexion, bilateral hip flexion weakness  REFLEXES: Reflexes are 1  and symmetric  at the biceps, triceps, knees, and ankles. Plantar responses are flexor.  SENSORY: Intact to light touch, pinprick and vibratory sensation are intact in fingers and toes.  COORDINATION: There is no trunk or limb dysmetria noted.  GAIT/STANCE: She can get up from seated position arm crossed, cautious  REVIEW OF SYSTEMS:  Full 14 system review of systems performed and notable only for as above All other review of systems were negative.   ALLERGIES: No Known Allergies  HOME MEDICATIONS: Current Outpatient Medications  Medication Sig Dispense Refill   aspirin EC 81 MG tablet Take 1 tablet (81 mg total) by mouth  daily. Swallow whole. 30 tablet 12   Cholecalciferol (VITAMIN D-3) 5000 UNITS TABS Take 5,000 Units by mouth daily.     COLLAGEN PO Take 1 Scoop by mouth daily.     ezetimibe (ZETIA) 10 MG tablet Take 1 tablet (10 mg total) by mouth daily. 30 tablet 3   hydrOXYzine (ATARAX) 10 MG tablet 1- 2 tab qhs prn 60 tablet 3   lisinopril (ZESTRIL) 20 MG tablet Take 1.5 tablets (30 mg total) by mouth daily. 145 tablet 2   Multiple Vitamins-Minerals (MULTIVITAMIN WITH MINERALS) tablet Take 1 tablet by mouth daily.     predniSONE (DELTASONE) 10 MG tablet 4 tabs qam xone week, 3.5 tabs qam xone week 3 tabs qam xone week 2.5 tab qam xone week 2 tabs qam 120 tablet 3   pyridostigmine (MESTINON) 60 MG tablet Take 1 tablet (60 mg total) by mouth 3 (three) times daily. 90 tablet 6   traMADol (ULTRAM) 50 MG tablet Take 1 tablet (50 mg total) by mouth every 6 (six) hours as needed (mild pain). 28 tablet 0   No current facility-administered medications for this visit.    PAST MEDICAL HISTORY: Past Medical History:  Diagnosis Date   Breast CA ([place])    Hyperlipidemia    Hypertension    Myasthenia gravis ([place])    Dx'd 03/2022   Osteoporosis 10/12/2012    PAST SURGICAL HISTORY: Past Surgical History:  Procedure Laterality Date   ABDOMINAL HYSTERECTOMY  2005   BACK SURGERY  2000   MASTECTOMY Bilateral 1995    FAMILY HISTORY: Family History  Problem Relation Age of Onset   Stroke Mother    Hypertension Mother    Myasthenia gravis Mother    Hypertension Father     SOCIAL HISTORY: Social History   Socioeconomic History   Marital status: Divorced    Spouse name: Not on file   Number of children: Not on file   Years of education: Not on file   Highest education level: Not on file  Occupational History   Not on file  Tobacco Use   Smoking status: Never   Smokeless tobacco: Never  Vaping Use   Vaping Use: Never used  Substance and Sexual Activity   Alcohol use: Yes    Alcohol/week: 1.0  standard drink of alcohol    Types: 1 Glasses of wine per week    Comment: occasional   Drug use: No   Sexual activity: Not Currently  Other Topics Concern   Not on file  Social History Narrative   Not on file   Social Determinants of Health   Financial Resource Strain: Medium Risk (05/20/2022)   Overall Financial Resource Strain (CARDIA)    Difficulty of Paying Living Expenses: Somewhat hard  Food Insecurity: No Food Insecurity (04/26/2022)   Hunger Vital Sign    Worried About Running Out of Food in the  Last Year: Never true    [place] in the Last Year: Never true  Transportation Needs: No Transportation Needs (04/26/2022)   PRAPARE - Hydrologist (Medical): No    Lack of Transportation (Non-Medical): No  Physical Activity: Not on file  Stress: Not on file  Social Connections: Moderately Integrated (05/20/2022)   Social Connection and Isolation Panel [NHANES]    Frequency of Communication with Friends and Family: More than three times a week    Frequency of Social Gatherings with Friends and Family: More than three times a week    Attends Religious Services: More than 4 times per year    Active Member of [employer] or Organizations: Yes    Attends Archivist Meetings: More than 4 times per year    Marital Status: Divorced  Intimate Partner Violence: Not At Risk (04/26/2022)   Humiliation, Afraid, Rape, and Kick questionnaire    Fear of Current or Ex-Partner: No    Emotionally Abused: No    Physically Abused: No    Sexually Abused: No     Marcial Pacas, M.D. Ph.D.  [facility] [address] [place], [place] [address] Ph: [phone] Fax: [phone]

## 2022-06-04 LAB — IGG, IGA, IGM
IgA/Immunoglobulin A, Serum: 163 mg/dL (ref 64–422)
IgG (Immunoglobin G), Serum: 518 mg/dL — ABNORMAL LOW (ref 586–1602)
IgM (Immunoglobulin M), Srm: 54 mg/dL (ref 26–217)

## 2022-06-06 ENCOUNTER — Telehealth: Payer: Self-pay | Admitting: Neurology

## 2022-06-06 DIAGNOSIS — C37 Malignant neoplasm of thymus: Secondary | ICD-10-CM | POA: Diagnosis not present

## 2022-06-06 DIAGNOSIS — Z510 Encounter for antineoplastic radiation therapy: Secondary | ICD-10-CM | POA: Diagnosis not present

## 2022-06-06 MED ORDER — NYSTATIN 100000 UNIT/ML MT SUSP: 5.0000 mL | Freq: Four times a day (QID) | OROMUCOSAL | 1 refills | Status: DC

## 2022-06-06 NOTE — Telephone Encounter (Signed)
Meds ordered this encounter  Medications   nystatin (MYCOSTATIN) 100000 UNIT/ML suspension    Sig: Take 5 mLs (500,000 Units total) by mouth 4 (four) times daily.    Dispense:  500 mL    Refill:  1  Please call patient, I have called in above medication to her pharmacy,  If after using 1 week, she is still not getting better, and was not get evaluated by her primary care, advised her to call back our office,

## 2022-06-06 NOTE — Telephone Encounter (Signed)
Called patient and she states that her tongue is completely white, she has a metallic taste in her mouth that she cannot get rid of, has chest pains and continues to take tums but nothing is relieving her symptoms, would like recommendations or a call from provider

## 2022-06-06 NOTE — Telephone Encounter (Signed)
Pt called and stated she is having issues taking predniSONE (DELTASONE) 10 MG tablet. Stated she is having chest pain and her blood pressure is high 178/93, also her tongue is white and stated she has a horrible taste in her mouth. Pt is requesting a call back from nurse.

## 2022-06-07 NOTE — Telephone Encounter (Signed)
Pt aware.

## 2022-06-08 NOTE — Telephone Encounter (Signed)
I talked with patient, she just got prescription March 19, has not tried nystatin mouthwash yet, advised her 1 teaspoon= 5 cc 4 times daily,  If symptoms remain, she may contact her primary care,

## 2022-06-09 ENCOUNTER — Other Ambulatory Visit: Payer: Self-pay

## 2022-06-09 ENCOUNTER — Telehealth: Payer: Self-pay

## 2022-06-09 ENCOUNTER — Ambulatory Visit
Admission: RE | Admit: 2022-06-09 | Discharge: 2022-06-09 | Disposition: A | Discharge status: Home / Self Care | Payer: Medicare HMO | Source: Ambulatory Visit | Attending: Radiation Oncology | Admitting: Radiation Oncology

## 2022-06-09 ENCOUNTER — Telehealth: Payer: Self-pay | Admitting: *Deleted

## 2022-06-09 DIAGNOSIS — C37 Malignant neoplasm of thymus: Secondary | ICD-10-CM | POA: Diagnosis not present

## 2022-06-09 DIAGNOSIS — Z510 Encounter for antineoplastic radiation therapy: Secondary | ICD-10-CM | POA: Diagnosis not present

## 2022-06-09 LAB — RAD ONC ARIA SESSION SUMMARY
Course Elapsed Days: 0
Plan Fractions Treated to Date: 1
Plan Prescribed Dose Per Fraction: 1.8 Gy
Plan Total Fractions Prescribed: 30
Plan Total Prescribed Dose: 54 Gy
Reference Point Dosage Given to Date: 1.8 Gy
Reference Point Session Dosage Given: 1.8 Gy
Session Number: 1

## 2022-06-09 NOTE — Telephone Encounter (Signed)
Received a paper copy of denial letter for hydroxyzine, gave her a 30 day supply but it is not on her formulary, they advised she switch medications. Will forward to MD for review and recommendations

## 2022-06-09 NOTE — Telephone Encounter (Signed)
Per scheduling message [facility] patient and lvm of new rescheduled appointment - requested callback to confirm.

## 2022-06-10 ENCOUNTER — Ambulatory Visit
Admission: RE | Admit: 2022-06-10 | Discharge: 2022-06-10 | Disposition: A | Discharge status: Home / Self Care | Payer: Medicare HMO | Source: Ambulatory Visit | Attending: Radiation Oncology | Admitting: Radiation Oncology

## 2022-06-10 ENCOUNTER — Inpatient Hospital Stay: Payer: Medicare HMO

## 2022-06-10 ENCOUNTER — Other Ambulatory Visit: Payer: Self-pay

## 2022-06-10 DIAGNOSIS — Z510 Encounter for antineoplastic radiation therapy: Secondary | ICD-10-CM | POA: Diagnosis not present

## 2022-06-10 DIAGNOSIS — C37 Malignant neoplasm of thymus: Secondary | ICD-10-CM | POA: Diagnosis not present

## 2022-06-10 LAB — RAD ONC ARIA SESSION SUMMARY
Course Elapsed Days: 1
Plan Fractions Treated to Date: 2
Plan Prescribed Dose Per Fraction: 1.8 Gy
Plan Total Fractions Prescribed: 30
Plan Total Prescribed Dose: 54 Gy
Reference Point Dosage Given to Date: 3.6 Gy
Reference Point Session Dosage Given: 1.8 Gy
Session Number: 2

## 2022-06-10 NOTE — Progress Notes (Signed)
[facility] CSW Progress Note  Holiday representative met with patient to discuss financial issues per the request of Company secretary.  Patient reports having difficulty paying for food at times.  Provided patient with two [place].  Also provided information on the [employer] and [employer] and [employer].  She expressed no other needs.    Rodman Pickle Lunetta Marina, LCSW

## 2022-06-13 ENCOUNTER — Other Ambulatory Visit: Payer: Self-pay

## 2022-06-13 ENCOUNTER — Ambulatory Visit
Admission: RE | Admit: 2022-06-13 | Discharge: 2022-06-13 | Disposition: A | Discharge status: Home / Self Care | Payer: Medicare HMO | Source: Ambulatory Visit | Attending: Radiation Oncology | Admitting: Radiation Oncology

## 2022-06-13 DIAGNOSIS — C37 Malignant neoplasm of thymus: Secondary | ICD-10-CM | POA: Diagnosis not present

## 2022-06-13 DIAGNOSIS — Z510 Encounter for antineoplastic radiation therapy: Secondary | ICD-10-CM | POA: Diagnosis not present

## 2022-06-13 LAB — RAD ONC ARIA SESSION SUMMARY
Course Elapsed Days: 4
Plan Fractions Treated to Date: 3
Plan Prescribed Dose Per Fraction: 1.8 Gy
Plan Total Fractions Prescribed: 30
Plan Total Prescribed Dose: 54 Gy
Reference Point Dosage Given to Date: 5.4 Gy
Reference Point Session Dosage Given: 1.8 Gy
Session Number: 3

## 2022-06-14 ENCOUNTER — Ambulatory Visit
Admission: RE | Admit: 2022-06-14 | Discharge: 2022-06-14 | Disposition: A | Discharge status: Home / Self Care | Payer: Medicare HMO | Source: Ambulatory Visit | Attending: Radiation Oncology | Admitting: Radiation Oncology

## 2022-06-14 ENCOUNTER — Other Ambulatory Visit: Payer: Self-pay

## 2022-06-14 ENCOUNTER — Emergency Department (HOSPITAL_COMMUNITY)
Admission: EM | Admit: 2022-06-14 | Discharge: 2022-06-14 | Disposition: A | Discharge status: Home / Self Care | Payer: Medicare HMO | Attending: Emergency Medicine | Admitting: Emergency Medicine

## 2022-06-14 ENCOUNTER — Emergency Department (HOSPITAL_COMMUNITY): Payer: Medicare HMO

## 2022-06-14 DIAGNOSIS — D72829 Elevated white blood cell count, unspecified: Secondary | ICD-10-CM | POA: Insufficient documentation

## 2022-06-14 DIAGNOSIS — E876 Hypokalemia: Secondary | ICD-10-CM | POA: Diagnosis not present

## 2022-06-14 DIAGNOSIS — I2693 Single subsegmental pulmonary embolism without acute cor pulmonale: Secondary | ICD-10-CM | POA: Diagnosis not present

## 2022-06-14 DIAGNOSIS — C37 Malignant neoplasm of thymus: Secondary | ICD-10-CM | POA: Diagnosis not present

## 2022-06-14 DIAGNOSIS — D4989 Neoplasm of unspecified behavior of other specified sites: Secondary | ICD-10-CM

## 2022-06-14 DIAGNOSIS — Z79899 Other long term (current) drug therapy: Secondary | ICD-10-CM | POA: Insufficient documentation

## 2022-06-14 DIAGNOSIS — I10 Essential (primary) hypertension: Secondary | ICD-10-CM | POA: Insufficient documentation

## 2022-06-14 DIAGNOSIS — Z7982 Long term (current) use of aspirin: Secondary | ICD-10-CM | POA: Insufficient documentation

## 2022-06-14 DIAGNOSIS — R0789 Other chest pain: Secondary | ICD-10-CM | POA: Diagnosis not present

## 2022-06-14 DIAGNOSIS — R079 Chest pain, unspecified: Secondary | ICD-10-CM | POA: Diagnosis not present

## 2022-06-14 DIAGNOSIS — Z510 Encounter for antineoplastic radiation therapy: Secondary | ICD-10-CM | POA: Diagnosis not present

## 2022-06-14 LAB — RAD ONC ARIA SESSION SUMMARY
Course Elapsed Days: 5
Plan Fractions Treated to Date: 4
Plan Prescribed Dose Per Fraction: 1.8 Gy
Plan Total Fractions Prescribed: 30
Plan Total Prescribed Dose: 54 Gy
Reference Point Dosage Given to Date: 7.2 Gy
Reference Point Session Dosage Given: 1.8 Gy
Session Number: 4

## 2022-06-14 LAB — CBC
HCT: 41 % (ref 36.0–46.0)
Hemoglobin: 13.5 g/dL (ref 12.0–15.0)
MCH: 31.8 pg (ref 26.0–34.0)
MCHC: 32.9 g/dL (ref 30.0–36.0)
MCV: 96.7 fL (ref 80.0–100.0)
Platelets: 234 10*3/uL (ref 150–400)
RBC: 4.24 MIL/uL (ref 3.87–5.11)
RDW: 16.3 % — ABNORMAL HIGH (ref 11.5–15.5)
WBC: 14.1 10*3/uL — ABNORMAL HIGH (ref 4.0–10.5)
nRBC: 0 % (ref 0.0–0.2)

## 2022-06-14 LAB — D-DIMER, QUANTITATIVE: D-Dimer, Quant: 0.8 ug/mL-FEU — ABNORMAL HIGH (ref 0.00–0.50)

## 2022-06-14 LAB — BASIC METABOLIC PANEL
Anion gap: 9 (ref 5–15)
BUN: 21 mg/dL (ref 8–23)
CO2: 26 mmol/L (ref 22–32)
Calcium: 8.3 mg/dL — ABNORMAL LOW (ref 8.9–10.3)
Chloride: 105 mmol/L (ref 98–111)
Creatinine, Ser: 0.56 mg/dL (ref 0.44–1.00)
GFR, Estimated: >60 mL/min (ref >60)
Glucose, Bld: 108 mg/dL — ABNORMAL HIGH (ref 70–99)
Potassium: 3 mmol/L — ABNORMAL LOW (ref 3.5–5.1)
Sodium: 140 mmol/L (ref 135–145)

## 2022-06-14 LAB — TROPONIN I (HIGH SENSITIVITY)
Troponin I (High Sensitivity): 14 ng/L (ref <18)
Troponin I (High Sensitivity): 11 ng/L (ref <18)

## 2022-06-14 MED ORDER — SONAFINE EX EMUL: 1.0000 | Freq: Once | CUTANEOUS | Status: DC

## 2022-06-14 MED ORDER — POTASSIUM CHLORIDE CRYS ER 20 MEQ PO TBCR
40.0000 meq | EXTENDED_RELEASE_TABLET | Freq: Once | ORAL | Status: AC
  Administered 2022-06-14: 40 meq via ORAL
  Filled 2022-06-14: qty 2

## 2022-06-14 MED ORDER — LISINOPRIL 40 MG PO TABS: 40.0000 mg | ORAL_TABLET | Freq: Every day | ORAL | 3 refills | Status: DC

## 2022-06-14 MED ORDER — IOHEXOL 350 MG/ML SOLN
75.0000 mL | Freq: Once | INTRAVENOUS | Status: AC | PRN
  Administered 2022-06-14: 75 mL via INTRAVENOUS

## 2022-06-14 MED ORDER — APIXABAN 5 MG PO TABS: 5.0000 mg | ORAL_TABLET | Freq: Two times a day (BID) | ORAL | Status: DC

## 2022-06-14 MED ORDER — ENSURE ENLIVE PO LIQD
1.0000 | Freq: Once | ORAL | Status: AC
  Administered 2022-06-14: 237 mL via ORAL
  Filled 2022-06-14: qty 237

## 2022-06-14 MED ORDER — APIXABAN 5 MG PO TABS
10.0000 mg | ORAL_TABLET | Freq: Two times a day (BID) | ORAL | Status: DC
  Administered 2022-06-14: 10 mg via ORAL
  Filled 2022-06-14: qty 2

## 2022-06-14 MED ORDER — APIXABAN (ELIQUIS) VTE STARTER PACK (10MG AND 5MG): ORAL_TABLET | ORAL | 0 refills | Status: DC

## 2022-06-14 NOTE — ED Provider Notes (Signed)
5:14 PM Assumed care of patient from off-going team. For more details, please see note from same day.  In brief, this is a 77 y.o. female with H/o thymoma that caused MG, removed in Feb. Had URI but MG got worse, has been on prednisone (insurance wouldn't cover IVIg). Getting chest radiation d/t positive margins. Got pressure in chest after radiation <1h, fourth time in last 3 weeks. Labs show: Elevated d-dimer, hypokalemia. Pain free currently. Has f/u with neuro/rad onc/onc  Plan/Dispo at time of sign-out & ED Course since sign-out: [ ]  delta trop, CVA  BP (!) 165/78   Pulse 69   Temp 98.5 F (36.9 C)   Resp (!) 21   SpO2 96%    ED Course:   Clinical Course as of 06/14/22 2010  Tue Jun 14, 2022  1750 CT Angio Chest PE W and/or Wo Contrast 1. Mildly motion degraded exam. 2. Isolated subsegmental filling defect to the left upper lobe consistent with small volume pulmonary embolus of indeterminate clinical significance. 3. No other explanation for patient's symptoms. 4.  Aortic Atherosclerosis (ICD10-I70.0).   [HN]  1751 Troponin I (High Sensitivity): 11 Unremarkable delta trop [HN]  [id number] Repleting K, ordering eliquis per pharmacy d/t age/weight [HN]  [id number] sPESI is high d/t h/o thymoma [HN]  1823 Discussed with oncologist on-call who will make [name], the patient's rad onc aware, and patient is scheduled for radiation tomorrow at 3pm and can follow up then. Will prescribe eliquis, CBC is okay with no bleeding history. [HN]    Clinical Course User Index [HN] Audley Hose, MD    Dispo: Extensive discussion with patient in room about PE and eliquis. Patient is given 10 mg eliquis PO, prescribed eliquis starter pack, plan for f/u tomorrow. DC w/ discharge instructions/return precautions. All questions answered to patient's satisfaction.   ------------------------------- Cindee Lame, MD Emergency Medicine  This note was created using dictation software, which may contain  spelling or grammatical errors.   Audley Hose, MD 06/14/22 2011

## 2022-06-14 NOTE — ED Provider Notes (Signed)
[place] EMERGENCY DEPARTMENT AT [facility] Provider Note   CSN: [id number] Arrival date & time: 06/14/22  1415     History  Chief Complaint  Patient presents with   Chest Pain    Faith Massey is a 77 y.o. female.  Patient is a 77 year old female with a history of hypertension, recently diagnosed thymic tumor in the anterior mediastinum status post resection last month but also myasthenia gravis who is currently getting radiation of her chest due to positive margins and on a prednisone taper for worsening of her myasthenia gravis after URI who is presenting today with complaint of chest pain from radiation.  Patient reports that initially her myasthenia symptoms improved after removal of the thymoma but after getting bronchitis she had recurrent weakness and head drop and started on steroids.  She has been on the steroids for 3 weeks now and reports she is feeling terrible.  She is gotten thrush, cannot sleep, feeling like her legs are swelling and she is having some blurry vision.  She reports she cannot get IVIG unless she completes a course of prednisone.  She reports her blood sugar has been running high and her blood pressure.  Today after having radiation they were weighing her and she was going into see Dr. Rande Lawman and she developed chest pain.  She describes it as a squeezing pressure in her chest.  She always feels short of breath with the my stent and says it may be slightly worse.  It lasted less than an hour and now has resolved.  She reports this is the fourth episode of this occurring within the last 3 weeks.  The other times she has been at home and she took some Tums and laid down and the symptoms improved.  She has not had fever, no cough.  She denies any abdominal pain nausea or vomiting.  She currently denies any chest pain but reports she has some minimal discomfort in her back.  The history is provided by the patient and medical records.  Chest Pain       Home Medications Prior to Admission medications   Medication Sig Start Date End Date Taking? Authorizing Provider  aspirin EC 81 MG tablet Take 1 tablet (81 mg total) by mouth daily. Swallow whole. 03/30/22   Bonnielee Haff, MD  Cholecalciferol (VITAMIN D-3) 5000 UNITS TABS Take 5,000 Units by mouth daily.    [provider]  COLLAGEN PO Take 1 Scoop by mouth daily.    [provider]  ezetimibe (ZETIA) 10 MG tablet Take 1 tablet (10 mg total) by mouth daily. 03/30/22   Bonnielee Haff, MD  hydrOXYzine (ATARAX) 10 MG tablet 1- 2 tab qhs prn 05/30/22   Marcial Pacas, MD  lisinopril (ZESTRIL) 20 MG tablet Take 1.5 tablets (30 mg total) by mouth daily. 05/25/22 03/11/23  Copland, Gay Filler, MD  Multiple Vitamins-Minerals (MULTIVITAMIN WITH MINERALS) tablet Take 1 tablet by mouth daily.    [provider]  nystatin (MYCOSTATIN) 100000 UNIT/ML suspension Take 5 mLs (500,000 Units total) by mouth 4 (four) times daily. 06/06/22   Marcial Pacas, MD  predniSONE (DELTASONE) 10 MG tablet 4 tabs qam xone week, 3.5 tabs qam xone week 3 tabs qam xone week 2.5 tab qam xone week 2 tabs qam 05/12/22   Marcial Pacas, MD  pyridostigmine (MESTINON) 60 MG tablet Take 1 tablet (60 mg total) by mouth 3 (three) times daily. 05/16/22   Marcial Pacas, MD  traMADol (ULTRAM) 50 MG tablet  Take 1 tablet (50 mg total) by mouth every 6 (six) hours as needed (mild pain). 05/20/22   Antony Odea, PA-C      Allergies    Patient has no known allergies.    Review of Systems   Review of Systems  Cardiovascular:  Positive for chest pain.    Physical Exam Updated Vital Signs BP (!) 183/89   Pulse 80   Temp 98.5 F (36.9 C)   Resp 19   SpO2 99%  Physical Exam Vitals and nursing note reviewed.  Constitutional:      General: She is not in acute distress.    Appearance: She is well-developed.  HENT:     Head: Normocephalic and atraumatic.     Mouth/Throat:     Mouth: Mucous membranes are dry.      Comments: No focal plaques noted Eyes:     Pupils: Pupils are equal, round, and reactive to light.  Cardiovascular:     Rate and Rhythm: Normal rate and regular rhythm.     Heart sounds: Normal heart sounds. No murmur heard.    No friction rub.  Pulmonary:     Effort: Pulmonary effort is normal.     Breath sounds: Normal breath sounds. No wheezing or rales.     Comments: Well-healing lateral surgical scars on the chest Abdominal:     General: Bowel sounds are normal. There is no distension.     Palpations: Abdomen is soft.     Tenderness: There is no abdominal tenderness. There is no guarding or rebound.  Musculoskeletal:        General: No tenderness. Normal range of motion.     Right lower leg: Edema present.     Left lower leg: Edema present.     Comments: 1+ pitting edema bilateral ankles  Skin:    General: Skin is warm and dry.     Findings: No rash.  Neurological:     Mental Status: She is alert and oriented to person, place, and time. Mental status is at baseline.     Cranial Nerves: No cranial nerve deficit.  Psychiatric:        Behavior: Behavior normal.     ED Results / Procedures / Treatments   Labs (all labs ordered are listed, but only abnormal results are displayed) Labs Reviewed  CBC - Abnormal; Notable for the following components:      Result Value   WBC 14.1 (*)    RDW 16.3 (*)    All other components within normal limits  BASIC METABOLIC PANEL  D-DIMER, QUANTITATIVE  TROPONIN I (HIGH SENSITIVITY)    EKG EKG Interpretation  Date/Time:  Tuesday June 14 2022 14:13:00 EDT Ventricular Rate:  76 PR Interval:  101 QRS Duration: 89 QT Interval:  371 QTC Calculation: 418 R Axis:   28 Text Interpretation: Sinus rhythm Short PR interval RSR' in V1 or V2, probably normal variant Borderline ST depression, diffuse leads Minimal ST elevation, anterior leads No significant change since last tracing Confirmed by Blanchie Dessert [id number]) on 06/14/2022 3:09:06  PM  Radiology DG Chest 2 View  Result Date: 06/14/2022 CLINICAL DATA:  Chest pain EXAM: CHEST - 2 VIEW COMPARISON:  Previous studies including the examination of 05/25/2022 FINDINGS: The heart size and mediastinal contours are within normal limits. Both lungs are clear. Haziness seen in the left mid lung field may be related to chest wall attenuation. The visualized skeletal structures are unremarkable. IMPRESSION: No active cardiopulmonary disease. Electronically Signed  By: Elmer Picker M.D.   On: 06/14/2022 15:02    Procedures Procedures    Medications Ordered in ED Medications - No data to display  ED Course/ Medical Decision Making/ A&P                             Medical Decision Making Amount and/or Complexity of Data Reviewed External Data Reviewed: notes. Labs: ordered. Decision-making details documented in ED Course. Radiology: ordered and independent interpretation performed. Decision-making details documented in ED Course. ECG/medicine tests: ordered and independent interpretation performed. Decision-making details documented in ED Course.   Pt with multiple medical problems and comorbidities and presenting today with a complaint that caries a high risk for morbidity and mortality.  Here today with complaint of chest pain.  Patient's pain is now resolved.  Concern for PE, postsurgical complications, ACS, electrolyte abnormalities, pleural effusion, GI pathology.  Patient also seems to be having a lot of adverse symptoms from prednisone.  Here she is hypertensive but sats are 99% on room air.  Patient's symptoms are not classic for pericarditis or pericardial tamponade.  Patient is on aspirin but no other anticoagulant.  I independently interpreted patient's EKG which shows n acute abnormalities.   4:43 PM Patient remains symptom-free.  I independently interpreted patient's labs and CBC with leukocytosis of 15,000 today most likely from her ongoing prednisone use, BMP  with hypokalemia today at 3.0 but normal renal function and sodium levels, initial troponin is 14 and D-dimer is elevated at 0.80.  I have independently visualized and interpreted pt's images today.  Chest x-ray without acute findings.  Findings discussed with the patient.  At this time feel that she does need a delta troponin given the time in which her pain had started.  Also will do a CTA to rule out complications from recent surgery or PE.  If these test are normal feel comfortable that patient can be discharged home today and follow-up with her neurologist and oncologist.          Final Clinical Impression(s) / ED Diagnoses Final diagnoses:  None    Rx / DC Orders ED Discharge Orders     None         Blanchie Dessert, MD 06/14/22 1644

## 2022-06-14 NOTE — Progress Notes (Signed)
Patient into clinic after receiving 4th radiation treatment to chest. After being weighed noted patient to lose balance causing her to lean against the wall. Offered patient a wheelchair, patient refused stating, " I think I can make it to the room, I just feel really dizzy." Once in exam room noted patients' blood pressure to be elevated. 185/100, HR 90. Patient then reports severe chest pain and requesting tums. Informed patient that she should be assessed in the ED due to severe chest pain, and elevated blood pressure and recent surgical history. Patient agreed. Charge nurse notified and patient taken to [facility] ED room 5.

## 2022-06-14 NOTE — Progress Notes (Signed)
Pt here for patient teaching. Pt given Radiation and You booklet, skin care instructions, and Sonafine. Reviewed areas of pertinence such as fatigue, hair loss, mouth changes, nausea and vomiting, skin changes, throat changes, breast tenderness, breast swelling, cough, shortness of breath, earaches, and taste changes. Pt able to give teach back of to pat skin, use unscented/gentle soap, and drink plenty of water, apply Sonafine bid, avoid applying anything to skin within 4 hours of treatment, avoid wearing an under wire bra, and to use an electric razor if they must shave. Pt verbalizes understanding of information given and will contact nursing with any questions or concerns.     [email or web address]

## 2022-06-14 NOTE — Progress Notes (Signed)
ANTICOAGULATION CONSULT NOTE - Initial Consult  Pharmacy Consult for Eliquis dosing Indication: pulmonary embolus  No Known Allergies   Vital Signs: Temp: 98.5 F (36.9 C) (03/26 1419) BP: 165/78 (03/26 1630) Pulse Rate: 69 (03/26 1630)  Labs: Recent Labs    06/14/22 1454 06/14/22 1622  HGB 13.5  --   HCT 41.0  --   PLT 234  --   CREATININE 0.56  --   TROPONINIHS 14 11    Estimated Creatinine Clearance: 43.2 mL/min (by C-G formula based on SCr of 0.56 mg/dL).   Medical History: Past Medical History:  Diagnosis Date   Breast CA ([place])    Hyperlipidemia    Hypertension    Myasthenia gravis ([place])    Dx'd 03/2022   Osteoporosis 10/12/2012    Medications:  No anticoagulation PTA  Assessment: 77 yo female presenting with chest pain and shortness of breath.  3/26 CTA: Isolated subsegmental filling defect to the left upper lobe consistent with small volume pulmonary embolus of indeterminate.  Goal of Therapy:  Monitor platelets by anticoagulation protocol: Yes   Plan:  Start Eliquis 10 mg bid x 7 days, then 5 mg po bid  Continue to monitor H&H and platelets  Meleah Demeyer Pietro Cassis 06/14/2022,6:23 PM

## 2022-06-14 NOTE — Discharge Instructions (Addendum)
Thank you for coming to [facility] Emergency Department. You were seen for chest pain. We did an exam, labs, and imaging, and these showed a small blood clot in your left lung. For this will will treat you with a blood thinner called eliquis (apixaban). Please take 10 mg twice per day for 7 days and then take 5 mg twice per day. Please follow with your radiation oncologist tomorrow at 3 pm as originally scheduled. Please follow up with your primary care provider within 1 week.   Do not hesitate to return to the ED or call 911 if you experience: -Worsening symptoms -Shortness of breath -Lightheadedness, passing out -Fevers/chills -Anything else that concerns you    Information on my medicine - ELIQUIS (apixaban)   Why was Eliquis prescribed for you? Eliquis was prescribed to treat blood clots that may have been found in the veins of your legs (deep vein thrombosis) or in your lungs (pulmonary embolism) and to reduce the risk of them occurring again.  What do You need to know about Eliquis ? The starting dose is 10 mg (two 5 mg tablets) taken TWICE daily for the FIRST SEVEN (7) DAYS, then on 06-21-2022 (10PM dose)  the dose is reduced to ONE 5 mg tablet taken TWICE daily.  Eliquis may be taken with or without food.   Try to take the dose about the same time in the morning and in the evening. If you have difficulty swallowing the tablet whole please discuss with your pharmacist how to take the medication safely.  Take Eliquis exactly as prescribed and DO NOT stop taking Eliquis without talking to the doctor who prescribed the medication.  Stopping may increase your risk of developing a new blood clot.  Refill your prescription before you run out.  After discharge, you should have regular check-up appointments with your healthcare provider that is prescribing your Eliquis.    What do you do if you miss a dose? If a dose of ELIQUIS is not taken at the scheduled time, take it as soon as  possible on the same day and twice-daily administration should be resumed. The dose should not be doubled to make up for a missed dose.  Important Safety Information A possible side effect of Eliquis is bleeding. You should call your healthcare provider right away if you experience any of the following: Bleeding from an injury or your nose that does not stop. Unusual colored urine (red or dark brown) or unusual colored stools (red or black). Unusual bruising for unknown reasons. A serious fall or if you hit your head (even if there is no bleeding).  Some medicines may interact with Eliquis and might increase your risk of bleeding or clotting while on Eliquis. To help avoid this, consult your healthcare provider or pharmacist prior to using any new prescription or non-prescription medications, including herbals, vitamins, non-steroidal anti-inflammatory drugs (NSAIDs) and supplements.  This website has more information on Eliquis (apixaban): http://www.eliquis.com/eliquis/home

## 2022-06-14 NOTE — ED Triage Notes (Signed)
Pt brought over from [facility] for chest pain. Pt states that she finished radiation, then began having chest pains, that have since subsided.

## 2022-06-14 NOTE — Addendum Note (Signed)
Addended by: Lamar Blinks C on: 06/14/2022 04:57 PM   Modules accepted: Orders

## 2022-06-15 ENCOUNTER — Ambulatory Visit
Admission: RE | Admit: 2022-06-15 | Discharge: 2022-06-15 | Disposition: A | Discharge status: Home / Self Care | Payer: Medicare HMO | Source: Ambulatory Visit | Attending: Radiation Oncology | Admitting: Radiation Oncology

## 2022-06-15 ENCOUNTER — Other Ambulatory Visit: Payer: Self-pay

## 2022-06-15 DIAGNOSIS — C37 Malignant neoplasm of thymus: Secondary | ICD-10-CM | POA: Diagnosis not present

## 2022-06-15 DIAGNOSIS — Z510 Encounter for antineoplastic radiation therapy: Secondary | ICD-10-CM | POA: Diagnosis not present

## 2022-06-15 LAB — RAD ONC ARIA SESSION SUMMARY
Course Elapsed Days: 6
Plan Fractions Treated to Date: 5
Plan Prescribed Dose Per Fraction: 1.8 Gy
Plan Total Fractions Prescribed: 30
Plan Total Prescribed Dose: 54 Gy
Reference Point Dosage Given to Date: 9 Gy
Reference Point Session Dosage Given: 1.8 Gy
Session Number: 5

## 2022-06-16 ENCOUNTER — Other Ambulatory Visit: Payer: Self-pay

## 2022-06-16 ENCOUNTER — Ambulatory Visit
Admission: RE | Admit: 2022-06-16 | Discharge: 2022-06-16 | Disposition: A | Discharge status: Home / Self Care | Payer: Medicare HMO | Source: Ambulatory Visit | Attending: Radiation Oncology | Admitting: Radiation Oncology

## 2022-06-16 DIAGNOSIS — C37 Malignant neoplasm of thymus: Secondary | ICD-10-CM | POA: Diagnosis not present

## 2022-06-16 DIAGNOSIS — Z510 Encounter for antineoplastic radiation therapy: Secondary | ICD-10-CM | POA: Diagnosis not present

## 2022-06-16 LAB — RAD ONC ARIA SESSION SUMMARY
Course Elapsed Days: 7
Plan Fractions Treated to Date: 6
Plan Prescribed Dose Per Fraction: 1.8 Gy
Plan Total Fractions Prescribed: 30
Plan Total Prescribed Dose: 54 Gy
Reference Point Dosage Given to Date: 10.8 Gy
Reference Point Session Dosage Given: 1.8 Gy
Session Number: 6

## 2022-06-17 ENCOUNTER — Ambulatory Visit
Admission: RE | Admit: 2022-06-17 | Discharge: 2022-06-17 | Disposition: A | Discharge status: Home / Self Care | Payer: Medicare HMO | Source: Ambulatory Visit | Attending: Radiation Oncology | Admitting: Radiation Oncology

## 2022-06-17 ENCOUNTER — Other Ambulatory Visit: Payer: Self-pay

## 2022-06-17 DIAGNOSIS — C37 Malignant neoplasm of thymus: Secondary | ICD-10-CM | POA: Diagnosis not present

## 2022-06-17 DIAGNOSIS — Z510 Encounter for antineoplastic radiation therapy: Secondary | ICD-10-CM | POA: Diagnosis not present

## 2022-06-17 LAB — RAD ONC ARIA SESSION SUMMARY
Course Elapsed Days: 8
Plan Fractions Treated to Date: 7
Plan Prescribed Dose Per Fraction: 1.8 Gy
Plan Total Fractions Prescribed: 30
Plan Total Prescribed Dose: 54 Gy
Reference Point Dosage Given to Date: 12.6 Gy
Reference Point Session Dosage Given: 1.8 Gy
Session Number: 7

## 2022-06-20 ENCOUNTER — Telehealth: Payer: Self-pay

## 2022-06-20 ENCOUNTER — Other Ambulatory Visit: Payer: Self-pay

## 2022-06-20 ENCOUNTER — Ambulatory Visit (INDEPENDENT_AMBULATORY_CARE_PROVIDER_SITE_OTHER): Payer: Medicare HMO | Admitting: Family Medicine

## 2022-06-20 ENCOUNTER — Ambulatory Visit
Admission: RE | Admit: 2022-06-20 | Discharge: 2022-06-20 | Disposition: A | Discharge status: Home / Self Care | Payer: Medicare HMO | Source: Ambulatory Visit | Attending: Radiation Oncology | Admitting: Radiation Oncology

## 2022-06-20 VITALS — BP 134/76 | HR 90 | Temp 98.0°F | Resp 18 | Ht <= 58 in | Wt 114.2 lb

## 2022-06-20 DIAGNOSIS — C37 Malignant neoplasm of thymus: Secondary | ICD-10-CM | POA: Diagnosis not present

## 2022-06-20 DIAGNOSIS — L03211 Cellulitis of face: Secondary | ICD-10-CM | POA: Diagnosis not present

## 2022-06-20 DIAGNOSIS — R059 Cough, unspecified: Secondary | ICD-10-CM | POA: Insufficient documentation

## 2022-06-20 DIAGNOSIS — R0602 Shortness of breath: Secondary | ICD-10-CM | POA: Diagnosis not present

## 2022-06-20 DIAGNOSIS — Z510 Encounter for antineoplastic radiation therapy: Secondary | ICD-10-CM | POA: Insufficient documentation

## 2022-06-20 DIAGNOSIS — S46211A Strain of muscle, fascia and tendon of other parts of biceps, right arm, initial encounter: Secondary | ICD-10-CM

## 2022-06-20 DIAGNOSIS — D4989 Neoplasm of unspecified behavior of other specified sites: Secondary | ICD-10-CM | POA: Insufficient documentation

## 2022-06-20 LAB — RAD ONC ARIA SESSION SUMMARY
Course Elapsed Days: 11
Plan Fractions Treated to Date: 8
Plan Prescribed Dose Per Fraction: 1.8 Gy
Plan Total Fractions Prescribed: 30
Plan Total Prescribed Dose: 54 Gy
Reference Point Dosage Given to Date: 14.4 Gy
Reference Point Session Dosage Given: 1.8 Gy
Session Number: 8

## 2022-06-20 MED ORDER — DOXYCYCLINE HYCLATE 100 MG PO CAPS: 100.0000 mg | ORAL_CAPSULE | Freq: Two times a day (BID) | ORAL | 0 refills | Status: DC

## 2022-06-20 NOTE — Progress Notes (Signed)
[place] [facility] [address] [place], [place] [address] [phone]  Date:  06/20/2022   Name:  Faith Massey   DOB:  1945/07/09   MRN:  [id number]  PCP:  Darreld Mclean, MD    Chief Complaint: Skin Problem (She says this looked like a pimple under the skin on her chin that she noticed at the end of the week last week. It started swelling and draining. She also noticed a place behind her L ear as well. /Concerns/ questions:  1. she says last night she struggled really bad with Reflux- she thinks this is due to the Prednisone that she takes. 2. Knot on the R Bicept. )   History of Present Illness:  Faith Massey is a 77 y.o. very pleasant female patient who presents with the following:  Pt seen today for cellulitis of her face- she got in touch with [place] via mychart over the weekend.  She did not wish to go to the ER over the weekend so we made this appt as an urgent working this morning Last visit with myself 3/6 to establish care History of hypertension, CVA, thymoma, myasthenia gravis, remote breast cancer 1995, hyperlipidemia, osteoporosis, pre-diabetes She did have GDM years ago but otherwise prediabetes is a new incidental finding It appears that she was doing just fine until January of this year at which time she felt very tired in addition to right-sided facial droop and difficulty with word finding.  An MRI of the brain showed a subacute stroke, she is now seeing neurology. However pt remarks that some of these findings may have actually been myasthenia gravis instead of a stroke She was also diagnosed with myasthenia gravis by neurology; this was dx in January of this year  During her stroke workup a mediastinal mass was found-Dr. Roxan Hockey did a resection on February 5 which revealed a thymoma She is on mestinon for her MG and also still on her steroids for now - They hope to get her in IVIG treatment soon   She is now doing  some radiation treatment for her thymoma  She felt like she had an "under the skin pimple" on her chin about 5 days ago It got worse over the weekend and she called the on call nursing service who suggested she go to care right away but she decided not to do so.  She thought she would probably be okay She started on some amox that she had at home, she started on it yesterday She is not sure of the dosage but notes she has used 4 pills so far  She is taking 20 mg of prednisone daily right now  She does feel like the pimple is better- she got some clear discharge from the area but no pus  The area is less swollen and sore today compared with yesterday She otherwise generally feels fine, no fever or chills  Also, she notes that she had an apparent attack of reflux last night. She woke up with burning in her throat and acid brash taste.  She drank water and this resolved.  She does not normally have problems with reflux.  I recommended that she try using over-the-counter Tums daily for a few days  Also, patient notes that a couple of days ago she was lifting something leg with her right arm when she felt a pop and had some pain in her right biceps muscle. 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   Myasthenic syndrome 04/12/2022   CVA (cerebral vascular accident) 03/28/2022   HTN (hypertension) 03/28/2022   Chest mass 03/28/2022   Right knee injury 02/14/2017   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 10/16/2012   Osteoporosis 10/12/2012    Past Medical History:  Diagnosis Date   Breast CA    Hyperlipidemia    Hypertension    Myasthenia gravis    Dx'd 03/2022   Osteoporosis 10/12/2012    Past Surgical History:  Procedure Laterality Date   ABDOMINAL HYSTERECTOMY  2005   BACK SURGERY  2000    MASTECTOMY Bilateral 1995    Social History   Tobacco Use   Smoking status: Never   Smokeless tobacco: Never  Vaping Use   Vaping Use: Never used  Substance Use Topics   Alcohol use: Yes    Alcohol/week: 1.0 standard drink of alcohol    Types: 1 Glasses of wine per week    Comment: occasional   Drug use: No    Family History  Problem Relation Age of Onset   Stroke Mother    Hypertension Mother    Myasthenia gravis Mother    Hypertension Father     No Known Allergies  Medication list has been reviewed and updated.  Current Outpatient Medications on File Prior to Visit  Medication Sig Dispense Refill   APIXABAN (ELIQUIS) VTE STARTER PACK (10MG  AND 5MG ) Take as directed on package: start with two-5mg  tablets twice daily for 7 days. On day 8, switch to one-5mg  tablet twice daily. 1 each 0   aspirin EC 81 MG tablet Take 1 tablet (81 mg total) by mouth daily. Swallow whole. 30 tablet 12   Cholecalciferol (VITAMIN D-3) 5000 UNITS TABS Take 5,000 Units by mouth daily.     COLLAGEN PO Take 1 Scoop by mouth daily.     ezetimibe (ZETIA) 10 MG tablet Take 1 tablet (10 mg total) by mouth daily. 30 tablet 3   hydrOXYzine (ATARAX) 10 MG tablet 1- 2 tab qhs prn 60 tablet 3   lisinopril (ZESTRIL) 40 MG tablet Take 1 tablet (40 mg total) by mouth daily. 90 tablet 3   Multiple Vitamins-Minerals (MULTIVITAMIN WITH MINERALS) tablet Take 1 tablet by mouth daily.     nystatin (MYCOSTATIN) 100000 UNIT/ML suspension Take 5 mLs (500,000 Units total) by mouth 4 (four) times daily. 500 mL 1   predniSONE (DELTASONE) 10 MG tablet 4 tabs qam xone week, 3.5 tabs qam xone week 3 tabs qam xone week 2.5 tab qam xone week 2 tabs qam 120 tablet 3   pyridostigmine (MESTINON) 60 MG tablet Take 1 tablet (60 mg total) by mouth 3 (three) times daily. 90 tablet 6   traMADol (ULTRAM) 50 MG tablet Take 1 tablet (50 mg total) by mouth every 6 (six) hours as needed (mild pain). 28 tablet 0   No current  facility-administered medications on file prior to visit.    Review of Systems:  As per HPI- otherwise negative.   Physical Examination: Vitals:   06/20/22 1009 06/20/22 1023  BP: 134/76   Pulse: (!) 111 90  Resp: 18   Temp: 98 F (36.7 C)   SpO2: 97%    Vitals:   06/20/22 1009  Weight: 114 lb 3.2 oz (51.8 kg)  Height: 4'  10" (1.473 m)   Body mass index is 23.87 kg/m. Ideal Body Weight: Weight in (lb) to have BMI = 25: 119.4  GEN: no acute distress.  Normal weight, looks well HEENT: Atraumatic, Normocephalic.  Ears and Nose: No external deformity. CV: RRR, No M/G/R. No JVD. No thrill. No extra heart sounds. PULM: CTA B, no wheezes, crackles, rhonchi. No retractions. No resp. distress. No accessory muscle use. ABD: S, NT, ND, +BS. No rebound. No HSM. EXTR: No c/c/e PSYCH: Normally interactive. Conversant.  Pt has cellulitis versus pimple on her chin.  It is tender and inflamed but not fluctuant.  Per patient it is improving Right biceps seems normal on exam.  Normal strength, muscle bulk and contour.  She is mildly tender over the right biceps muscle belly   Pulse Readings from Last 3 Encounters:  06/20/22 90  06/14/22 69  06/03/22 77    Assessment and Plan: Cellulitis of chin - Plan: doxycycline (VIBRAMYCIN) 100 MG capsule  Biceps strain, right, initial encounter  Cellulitis/pimple of chin.  Patient started on amoxicillin yesterday and notes improvement already.  Will change her to doxycycline for 10 days.  I asked her to continue to watch this area closely and to seek care if this should worsen again  As above, patient noticed pain and a popping feeling in her biceps recently.  On exam the muscle seems to be intact and tendon normal.  I offered to have her seen by orthopedics for evaluation, for the time being she declines.  I asked her to please let me know if her biceps muscle is not feeling back to normal within the next 1 to 2 weeks  Signed Lamar Blinks,  MD

## 2022-06-20 NOTE — Telephone Encounter (Signed)
Initial Comment Caller states she is undergoing radiation now. She had cardiothoracic surgery back in February 5th. She has myasthenia gravis. She has called and the nurse through her insurance was concerned because she is on prednisone and radiation. She has a spot on her face that is hard about the size of a half dollar. She said it has gotten bigger and is under the skin. She said it has gotten bigger and harder since yesterday afternoon. Translation No Nurse Assessment Nurse: Kinnie Scales, RN, [place] Date/Time Eilene Ghazi Time): 06/19/2022 9:14:03 AM Confirm and document reason for call. If symptomatic, describe symptoms. ---Caller states is currently going through radiation and taking prednisone. She has SOB. She has an under skin pimple that is getting worse and bigger. Underneath this skin is hard and is a half dollar size. First noticed the growth last Thursday. Does the patient have any new or worsening symptoms? ---Yes Will a triage be completed? ---Yes Related visit to physician within the last 2 weeks? ---No Does the PT have any chronic conditions? (i.e. diabetes, asthma, this includes High risk factors for pregnancy, etc.) ---Yes List chronic conditions. ---Myasthenia Gravis PE currently on Eloquis Is this a behavioral health or substance abuse call? ---No Guidelines Guideline Title Affirmed Question Affirmed Notes Nurse Date/Time (Eastern Time) Breathing Difficulty [1] MODERATE difficulty breathing (e.g., speaks in [place], [place], [place] 06/19/2022 9:18:46 AM PLEASE NOTE: All timestamps contained within this report are represented as [place] Standard Time. CONFIDENTIALTY NOTICE: This fax transmission is intended only for the addressee. It contains information that is legally privileged, confidential or otherwise protected from use or disclosure. If you are not the intended recipient, you are strictly prohibited from reviewing, disclosing, copying using or  disseminating any of this information or taking any action in reliance on or regarding this information. If you have received this fax in error, please notify us immediately by telephone so that we can arrange for its return to [place]. Phone: [phone], Toll-Free: [phone], Fax: [phone] Page: 2 of 2 Call Id: [id number] Guidelines Guideline Title Affirmed Question Affirmed Notes Nurse Date/Time Eilene Ghazi Time) phrases, SOB even at rest, pulse 100-120) AND [2] NEW-onset or WORSE than normal Disp. Time Eilene Ghazi Time) Disposition Final User 06/19/2022 9:23:40 AM Go to ED Now Yes Kinnie Scales, RN, [place] Final Disposition 06/19/2022 9:23:40 AM Go to ED Now Yes Kinnie Scales, RN, Arnetha Courser Disagree/Comply Comply Caller Understands Yes PreDisposition InappropriateToAsk Care Advice Given Per Guideline GO TO ED NOW: * You need to be seen in the Emergency Department. * Go to the ED at ___________ [place] now. Drive carefully. * Another adult should drive. BRING MEDICINES: NOTE TO TRIAGER - DRIVING: CARE ADVICE given per Breathing Difficulty (Adult) guideline. CALL EMS 911 IF: * Call EMS if you become worse. * If immediate transportation is not available via car, rideshare (e.g., Lyft, Uber), or taxi, then the patient should be instructed to call EMS-911. Comments User: Geradine Girt, RN Date/Time Eilene Ghazi Time): 06/19/2022 9:23:19 AM Caller unsure if she will go to ED Referrals [place] UNDECIDED

## 2022-06-20 NOTE — Telephone Encounter (Signed)
Scheduled for 10 am , called pt

## 2022-06-20 NOTE — Telephone Encounter (Signed)
Pt was seen in office today

## 2022-06-20 NOTE — Patient Instructions (Signed)
Good to see you today- I think your chin pimple is getting better but continue to monitor closely Change over the doxycycline for 10 days, please let me know if not continuing to improve I think you strained your right biceps as opposed to a major tear, but if not feeling back to normal in 1-2 weeks please alert me

## 2022-06-21 ENCOUNTER — Other Ambulatory Visit: Payer: Self-pay

## 2022-06-21 ENCOUNTER — Telehealth: Payer: Self-pay | Admitting: Neurology

## 2022-06-21 ENCOUNTER — Ambulatory Visit
Admission: RE | Admit: 2022-06-21 | Discharge: 2022-06-21 | Disposition: A | Discharge status: Home / Self Care | Payer: Medicare HMO | Source: Ambulatory Visit | Attending: Radiation Oncology | Admitting: Radiation Oncology

## 2022-06-21 ENCOUNTER — Telehealth: Payer: Self-pay | Admitting: Family Medicine

## 2022-06-21 DIAGNOSIS — Z510 Encounter for antineoplastic radiation therapy: Secondary | ICD-10-CM | POA: Diagnosis not present

## 2022-06-21 DIAGNOSIS — C37 Malignant neoplasm of thymus: Secondary | ICD-10-CM | POA: Diagnosis not present

## 2022-06-21 DIAGNOSIS — R0602 Shortness of breath: Secondary | ICD-10-CM | POA: Diagnosis not present

## 2022-06-21 DIAGNOSIS — R059 Cough, unspecified: Secondary | ICD-10-CM | POA: Diagnosis not present

## 2022-06-21 DIAGNOSIS — D4989 Neoplasm of unspecified behavior of other specified sites: Secondary | ICD-10-CM

## 2022-06-21 LAB — RAD ONC ARIA SESSION SUMMARY
Course Elapsed Days: 12
Plan Fractions Treated to Date: 9
Plan Prescribed Dose Per Fraction: 1.8 Gy
Plan Total Fractions Prescribed: 30
Plan Total Prescribed Dose: 54 Gy
Reference Point Dosage Given to Date: 16.2 Gy
Reference Point Session Dosage Given: 1.8 Gy
Session Number: 9

## 2022-06-21 MED ORDER — SONAFINE EX EMUL
1.0000 | Freq: Once | CUTANEOUS | Status: AC
  Administered 2022-06-21: 1 via TOPICAL

## 2022-06-21 NOTE — Telephone Encounter (Signed)
Contacted Faith Massey to schedule their annual wellness visit. Appointment made for 06/22/2022.  Sherol Dade; Care Guide Ambulatory Clinical [facility] Direct Dial: [phone]

## 2022-06-21 NOTE — Telephone Encounter (Signed)
Pt called. Stated she needs to talk to nurse about coming off predniSONE (DELTASONE) 10 MG tablet. Stated she is having a lot of side effects.

## 2022-06-21 NOTE — Telephone Encounter (Signed)
Called and spoke to patient about concerns with medication prednisone. States she is taking it 2x daily and is having lack of sleep, breathing, balance and extremely swollen feet. Patient was approved for IVIG but was called and told she did not qualify for any assistance with her insurance and is unsure if she can afford it, she also wants to know if it is safe to get while getting radiation, she states she is finished with radiation on May 2nd. Please advise next steps

## 2022-06-22 ENCOUNTER — Ambulatory Visit
Admission: RE | Admit: 2022-06-22 | Discharge: 2022-06-22 | Disposition: A | Discharge status: Home / Self Care | Payer: Medicare HMO | Source: Ambulatory Visit | Attending: Radiation Oncology | Admitting: Radiation Oncology

## 2022-06-22 ENCOUNTER — Other Ambulatory Visit: Payer: Self-pay

## 2022-06-22 ENCOUNTER — Ambulatory Visit (INDEPENDENT_AMBULATORY_CARE_PROVIDER_SITE_OTHER): Payer: Medicare HMO | Admitting: *Deleted

## 2022-06-22 VITALS — Ht <= 58 in | Wt 114.0 lb

## 2022-06-22 DIAGNOSIS — R0602 Shortness of breath: Secondary | ICD-10-CM | POA: Diagnosis not present

## 2022-06-22 DIAGNOSIS — Z1211 Encounter for screening for malignant neoplasm of colon: Secondary | ICD-10-CM

## 2022-06-22 DIAGNOSIS — R059 Cough, unspecified: Secondary | ICD-10-CM | POA: Diagnosis not present

## 2022-06-22 DIAGNOSIS — Z510 Encounter for antineoplastic radiation therapy: Secondary | ICD-10-CM | POA: Diagnosis not present

## 2022-06-22 DIAGNOSIS — Z0000 Encounter for general adult medical examination without abnormal findings: Secondary | ICD-10-CM

## 2022-06-22 DIAGNOSIS — C37 Malignant neoplasm of thymus: Secondary | ICD-10-CM | POA: Diagnosis not present

## 2022-06-22 LAB — RAD ONC ARIA SESSION SUMMARY
Course Elapsed Days: 13
Plan Fractions Treated to Date: 10
Plan Prescribed Dose Per Fraction: 1.8 Gy
Plan Total Fractions Prescribed: 30
Plan Total Prescribed Dose: 54 Gy
Reference Point Dosage Given to Date: 18 Gy
Reference Point Session Dosage Given: 1.8 Gy
Session Number: 10

## 2022-06-22 MED ORDER — PREDNISONE 5 MG PO TABS: 20.0000 mg | ORAL_TABLET | Freq: Every day | ORAL | 6 refills | Status: DC

## 2022-06-22 NOTE — Telephone Encounter (Signed)
I called patient, she is approved for IVIG through intra fusion, but has more than $900 co-pay, which is an issue for her,  Could not tolerate prednisone very well, currently on 10 mg 2 tablets every day, complains of bilateral feet swelling, hard to find a shoe, her thrush is getting better with mouthwash, but now receiving antibiotic for face cellulitis  She continue has proximal muscle weakness, but no longer have difficulty breathing,  1.  Decided to continue to taper down prednisone, 5 mg tablets was called in, 20 mg daily for 2 weeks, 15 mg for 2 weeks, then 10 mg daily 2.  Will try home IVIG to see if it is more affordable for her

## 2022-06-22 NOTE — Telephone Encounter (Signed)
Orders faxed to [facility] for at home infusions

## 2022-06-22 NOTE — Progress Notes (Signed)
Subjective:  Pt completed ADLs, Fall risk, and SDOH during e-check in on 06/21/22.  Answers verified with pt.    Faith Massey is a 77 y.o. female who presents for an Initial Medicare Annual Wellness Visit.  I connected with  Faith Massey on 06/22/22 by a audio enabled telemedicine application and verified that I am speaking with the correct person using two identifiers.  Patient Location: Home  Provider Location: Office/Clinic  I discussed the limitations of evaluation and management by telemedicine. The patient expressed understanding and agreed to proceed.   Review of Systems     Cardiac Risk Factors include: advanced age (>78men, >34 women);hypertension     Objective:    Today's Vitals   06/22/22 1049  Weight: 114 lb (51.7 kg)  Height: 4\' 10"  (1.473 m)   Body mass index is 23.83 kg/m.     06/22/2022   10:30 AM 05/18/2022   11:12 AM 05/12/2022    1:57 PM 04/25/2022    6:23 AM 04/21/2022    1:25 PM 03/29/2022    4:00 PM 03/28/2022    1:09 PM  Advanced Directives  Does Patient Have a Medical Advance Directive? Yes Yes Yes Yes No Yes No  Type of Paramedic of [place];Living will [place];Living will [place];Living will [place];Living will  [place];Living will   Does patient want to make changes to medical advance directive?   No - Patient declined No - Patient declined  No - Patient declined   Copy of [place] in Chart? No - copy requested No - copy requested No - copy requested   No - copy requested   Would patient like information on creating a medical advance directive?  Yes (MAU/Ambulatory/Procedural Areas - Information given)   Yes (MAU/Ambulatory/Procedural Areas - Information given)      Current Medications (verified) Outpatient Encounter Medications as of 06/22/2022  Medication Sig   APIXABAN (ELIQUIS) VTE STARTER PACK (10MG  AND 5MG ) Take as directed  on package: start with two-5mg  tablets twice daily for 7 days. On day 8, switch to one-5mg  tablet twice daily.   aspirin EC 81 MG tablet Take 1 tablet (81 mg total) by mouth daily. Swallow whole.   Cholecalciferol (VITAMIN D-3) 5000 UNITS TABS Take 5,000 Units by mouth daily.   COLLAGEN PO Take 1 Scoop by mouth daily.   doxycycline (VIBRAMYCIN) 100 MG capsule Take 1 capsule (100 mg total) by mouth 2 (two) times daily.   ezetimibe (ZETIA) 10 MG tablet Take 1 tablet (10 mg total) by mouth daily.   hydrOXYzine (ATARAX) 10 MG tablet 1- 2 tab qhs prn   lisinopril (ZESTRIL) 40 MG tablet Take 1 tablet (40 mg total) by mouth daily.   Multiple Vitamins-Minerals (MULTIVITAMIN WITH MINERALS) tablet Take 1 tablet by mouth daily.   nystatin (MYCOSTATIN) 100000 UNIT/ML suspension Take 5 mLs (500,000 Units total) by mouth 4 (four) times daily.   predniSONE (DELTASONE) 10 MG tablet 4 tabs qam xone week, 3.5 tabs qam xone week 3 tabs qam xone week 2.5 tab qam xone week 2 tabs qam   pyridostigmine (MESTINON) 60 MG tablet Take 1 tablet (60 mg total) by mouth 3 (three) times daily.   traMADol (ULTRAM) 50 MG tablet Take 1 tablet (50 mg total) by mouth every 6 (six) hours as needed (mild pain).   No facility-administered encounter medications on file as of 06/22/2022.    Allergies (verified) Prednisone  History: Past Medical History:  Diagnosis Date   Breast CA    Hyperlipidemia    Hypertension    Myasthenia gravis    Dx'd 03/2022   Osteoporosis 10/12/2012   Past Surgical History:  Procedure Laterality Date   ABDOMINAL HYSTERECTOMY  03/22/2003   BACK SURGERY  03/21/1998   BREAST SURGERY     MASTECTOMY Bilateral 03/21/1993   Family History  Problem Relation Age of Onset   Stroke Mother    Hypertension Mother    Myasthenia gravis Mother    Hypertension Father    Social History   Socioeconomic History   Marital status: Divorced    Spouse name: Not on file   Number of children: Not on file    Years of education: Not on file   Highest education level: Bachelor's degree (e.g., BA, AB, BS)  Occupational History   Not on file  Tobacco Use   Smoking status: Never   Smokeless tobacco: Never  Vaping Use   Vaping Use: Never used  Substance and Sexual Activity   Alcohol use: Yes    Alcohol/week: 1.0 standard drink of alcohol    Types: 1 Glasses of wine per week    Comment: occasional   Drug use: No   Sexual activity: Not Currently  Other Topics Concern   Not on file  Social History Narrative   Not on file   Social Determinants of Health   Financial Resource Strain: Medium Risk (06/21/2022)   Overall Financial Resource Strain (CARDIA)    Difficulty of Paying Living Expenses: Somewhat hard  Food Insecurity: No Food Insecurity (06/21/2022)   Hunger Vital Sign    Worried About Running Out of Food in the Last Year: Never true    Ran Out of Food in the Last Year: Never true  Transportation Needs: No Transportation Needs (06/21/2022)   PRAPARE - Hydrologist (Medical): No    Lack of Transportation (Non-Medical): No  Physical Activity: Insufficiently Active (06/21/2022)   Exercise Vital Sign    Days of Exercise per Week: 5 days    Minutes of Exercise per Session: 20 min  Stress: Stress Concern Present (06/21/2022)   [place]    Feeling of Stress : To some extent  Social Connections: Moderately Integrated (06/21/2022)   Social Connection and Isolation Panel [NHANES]    Frequency of Communication with Friends and Family: More than three times a week    Frequency of Social Gatherings with Friends and Family: More than three times a week    Attends Religious Services: More than 4 times per year    Active Member of [employer] or Organizations: Yes    Attends Music therapist: More than 4 times per year    Marital Status: Divorced    Tobacco Counseling Counseling given: Not  Answered   Clinical Intake:  Pre-visit preparation completed: Yes  Pain : No/denies pain  BMI - recorded: 23.87 Nutritional Status: BMI of 19-24  Normal Nutritional Risks: None Diabetes: No  How often do you need to have someone help you when you read instructions, pamphlets, or other written materials from your doctor or pharmacy?: 1 - Never   Activities of Daily Living    06/21/2022   10:54 AM 04/26/2022    1:00 AM  In your present state of health, do you have any difficulty performing the following activities:  Hearing? 0 0  Vision? 0 0  Difficulty concentrating  or making decisions? 0 0  Walking or climbing stairs? 0 1  Dressing or bathing? 0 0  Doing errands, shopping? 0 0  Preparing Food and eating ? N   Using the Toilet? N   In the past six months, have you accidently leaked urine? N   Do you have problems with loss of bowel control? Y   Managing your Medications? N   Managing your Finances? N   Housekeeping or managing your Housekeeping? Y     Patient Care Team: Copland, Gay Filler, MD as PCP - General (Family Medicine) Florance, Tomasa Blase, RN as [place] Management Ennever, Rudell Cobb, MD as Medical Oncologist (Oncology) Cordelia Poche, RN as Oncology Nurse Navigator  Indicate any recent Medical Services you may have received from other than [facility] providers in the past year (date may be approximate).     Assessment:   This is a routine wellness examination for Faith Massey.  Hearing/Vision screen No results found.  Dietary issues and exercise activities discussed: Current Exercise Habits: Home exercise routine, Type of exercise: walking, Time (Minutes): 20, Frequency (Times/Week): 7, Weekly Exercise (Minutes/Week): 140, Intensity: Mild   Goals Addressed   None    Depression Screen    06/22/2022   10:32 AM 05/25/2022    1:40 PM 05/12/2022    3:17 PM  PHQ 2/9 Scores  PHQ - 2 Score 0 0 0    Fall Risk    06/21/2022   10:54 AM 05/25/2022     1:40 PM  [place] in the past year? 0 0  Number falls in past yr: 0 0  Injury with Fall? 0 0  Risk for fall due to : No Fall Risks No Fall Risks  Follow up Falls evaluation completed Falls evaluation completed    [place]:  Any stairs in or around the home? No  Home free of loose throw rugs in walkways, pet beds, electrical cords, etc? Yes  Adequate lighting in your home to reduce risk of falls? Yes   ASSISTIVE DEVICES UTILIZED TO PREVENT FALLS:  Life alert? No  Use of a cane, walker or w/c? No  Grab bars in the bathroom? Yes  Shower chair or bench in shower? No  Elevated toilet seat or a handicapped toilet?  Comfort height  TIMED UP AND GO:  Was the test performed?  No, audio visit .   Cognitive Function:        06/22/2022   10:43 AM  6CIT Screen  What Year? 0 points  What month? 0 points  What time? 0 points  Count back from 20 0 points  Months in reverse 0 points  Repeat phrase 0 points  Total Score 0 points    Immunizations Immunization History  Administered Date(s) Administered   Pneumococcal Polysaccharide-23 10/03/2012    TDAP status: Due, Education has been provided regarding the importance of this vaccine. Advised may receive this vaccine at local pharmacy or Health Dept. Aware to provide a copy of the vaccination record if obtained from local pharmacy or Health Dept. Verbalized acceptance and understanding.  Flu Vaccine status: Up to date  Pneumococcal vaccine status: Due, Education has been provided regarding the importance of this vaccine. Advised may receive this vaccine at local pharmacy or Health Dept. Aware to provide a copy of the vaccination record if obtained from local pharmacy or Health Dept. Verbalized acceptance and understanding.  Covid-19 vaccine status: Declined, Education has been  provided regarding the importance of this vaccine but patient still declined. Advised may receive this vaccine  at local pharmacy or Health Dept.or vaccine clinic. Aware to provide a copy of the vaccination record if obtained from local pharmacy or Health Dept. Verbalized acceptance and understanding.  Qualifies for Shingles Vaccine? Yes   Zostavax completed No   Shingrix Completed?: No.    Education has been provided regarding the importance of this vaccine. Patient has been advised to call insurance company to determine out of pocket expense if they have not yet received this vaccine. Advised may also receive vaccine at local pharmacy or Health Dept. Verbalized acceptance and understanding.  Screening Tests Health Maintenance  Topic Date Due   COVID-19 Vaccine (1) Never done   Hepatitis C Screening  Never done   DTaP/Tdap/Td (1 - Tdap) Never done   Zoster Vaccines- Shingrix (1 of 2) Never done   COLONOSCOPY (Pts 45-50yrs Insurance coverage will need to be confirmed)  Never done   DEXA SCAN  Never done   Pneumonia Vaccine 55+ Years old (2 of 2 - PCV) 10/03/2013   INFLUENZA VACCINE  10/20/2022   Medicare Annual Wellness (AWV)  06/22/2023   HPV VACCINES  Aged Out    Health Maintenance  Health Maintenance Due  Topic Date Due   COVID-19 Vaccine (1) Never done   Hepatitis C Screening  Never done   DTaP/Tdap/Td (1 - Tdap) Never done   Zoster Vaccines- Shingrix (1 of 2) Never done   COLONOSCOPY (Pts 45-52yrs Insurance coverage will need to be confirmed)  Never done   DEXA SCAN  Never done   Pneumonia Vaccine 51+ Years old (2 of 2 - PCV) 10/03/2013    Colorectal Cancer screen: ordered Cologuard today  Mammogram status: No longer required due to surgery.  Bone Density status: Ordered 05/25/22. Pt provided with contact info and advised to call to schedule appt.  Lung Cancer Screening: (Low Dose CT Chest recommended if Age 10-80 years, 30 pack-year currently smoking OR have quit w/in 15years.) does not qualify.   Additional Screening:  Hepatitis C Screening: does qualify; Completed  N/a  Vision Screening: Recommended annual ophthalmology exams for early detection of glaucoma and other disorders of the eye. Is the patient up to date with their annual eye exam?  No  Who is the provider or what is the name of the office in which the patient attends annual eye exams? Doesn't have eye doctor at this time If pt is not established with a provider, would they like to be referred to a provider to establish care? No .   Dental Screening: Recommended annual dental exams for proper oral hygiene  Community Resource Referral / Chronic Care Management: CRR required this visit?  No   CCM required this visit?  No      Plan:     I have personally reviewed and noted the following in the patient's chart:   Medical and social history Use of alcohol, tobacco or illicit drugs  Current medications and supplements including opioid prescriptions. Patient is currently taking opioid prescriptions. Information provided to patient regarding non-opioid alternatives. Patient advised to discuss non-opioid treatment plan with their provider. Functional ability and status Nutritional status Physical activity Advanced directives List of other physicians Hospitalizations, surgeries, and ER visits in previous 12 months Vitals Screenings to include cognitive, depression, and falls Referrals and appointments  In addition, I have reviewed and discussed with patient certain preventive protocols, quality metrics, and best practice recommendations. A  written personalized care plan for preventive services as well as general preventive health recommendations were provided to patient.   Due to this being a telephonic visit, the after visit summary with patients personalized plan was offered to patient via mail or my-chart. Patient would like to access on my-chart.  Beatris Ship, [place]   06/22/2022   Nurse Notes: None

## 2022-06-22 NOTE — Addendum Note (Signed)
Addended by: Marcial Pacas on: 06/22/2022 10:28 AM   Modules accepted: Orders

## 2022-06-22 NOTE — Patient Instructions (Signed)
Faith Massey , Thank you for taking time to come for your Medicare Wellness Visit. I appreciate your ongoing commitment to your health goals. Please review the following plan we discussed and let me know if I can assist you in the future.   These are the goals we discussed:  Goals   None     This is a list of the screening recommended for you and due dates:  Health Maintenance  Topic Date Due   COVID-19 Vaccine (1) Never done   Hepatitis C Screening: USPSTF Recommendation to screen - Ages 64-79 yo.  Never done   DTaP/Tdap/Td vaccine (1 - Tdap) Never done   Zoster (Shingles) Vaccine (1 of 2) Never done   Colon Cancer Screening  Never done   DEXA scan (bone density measurement)  Never done   Pneumonia Vaccine (2 of 2 - PCV) 10/03/2013   Flu Shot  10/20/2022   Medicare Annual Wellness Visit  06/22/2023   HPV Vaccine  Aged Out     Next appointment: Follow up in one year for your annual wellness visit.   Preventive Care 47 Years and Older, Female Preventive care refers to lifestyle choices and visits with your health care provider that can promote health and wellness. What does preventive care include? A yearly physical exam. This is also called an annual well check. Dental exams once or twice a year. Routine eye exams. Ask your health care provider how often you should have your eyes checked. Personal lifestyle choices, including: Daily care of your teeth and gums. Regular physical activity. Eating a healthy diet. Avoiding tobacco and drug use. Limiting alcohol use. Practicing safe sex. Taking low-dose aspirin every day. Taking vitamin and mineral supplements as recommended by your health care provider. What happens during an annual well check? The services and screenings done by your health care provider during your annual well check will depend on your age, overall health, lifestyle risk factors, and family history of disease. Counseling  Your health care provider may ask you  questions about your: Alcohol use. Tobacco use. Drug use. Emotional well-being. Home and relationship well-being. Sexual activity. Eating habits. History of falls. Memory and ability to understand (cognition). Work and work Statistician. Reproductive health. Screening  You may have the following tests or measurements: Height, weight, and BMI. Blood pressure. Lipid and cholesterol levels. These may be checked every 5 years, or more frequently if you are over 82 years old. Skin check. Lung cancer screening. You may have this screening every year starting at age 39 if you have a 30-pack-year history of smoking and currently smoke or have quit within the past 15 years. Fecal occult blood test (FOBT) of the stool. You may have this test every year starting at age 81. Flexible sigmoidoscopy or colonoscopy. You may have a sigmoidoscopy every 5 years or a colonoscopy every 10 years starting at age 110. Hepatitis C blood test. Hepatitis B blood test. Sexually transmitted disease (STD) testing. Diabetes screening. This is done by checking your blood sugar (glucose) after you have not eaten for a while (fasting). You may have this done every 1-3 years. Bone density scan. This is done to screen for osteoporosis. You may have this done starting at age 16. Mammogram. This may be done every 1-2 years. Talk to your health care provider about how often you should have regular mammograms. Talk with your health care provider about your test results, treatment options, and if necessary, the need for more tests. Vaccines  Your  health care provider may recommend certain vaccines, such as: Influenza vaccine. This is recommended every year. Tetanus, diphtheria, and acellular pertussis (Tdap, Td) vaccine. You may need a Td booster every 10 years. Zoster vaccine. You may need this after age 64. Pneumococcal 13-valent conjugate (PCV13) vaccine. One dose is recommended after age 53. Pneumococcal polysaccharide  (PPSV23) vaccine. One dose is recommended after age 33. Talk to your health care provider about which screenings and vaccines you need and how often you need them. This information is not intended to replace advice given to you by your health care provider. Make sure you discuss any questions you have with your health care provider. Document Released: 04/03/2015 Document Revised: 11/25/2015 Document Reviewed: 01/06/2015 Elsevier Interactive Patient Education  2017 [place] Prevention in the Home Falls can cause injuries. They can happen to people of all ages. There are many things you can do to make your home safe and to help prevent falls. What can I do on the outside of my home? Regularly fix the edges of walkways and driveways and fix any cracks. Remove anything that might make you trip as you walk through a door, such as a raised step or threshold. Trim any bushes or trees on the path to your home. Use bright outdoor lighting. Clear any walking paths of anything that might make someone trip, such as rocks or tools. Regularly check to see if handrails are loose or broken. Make sure that both sides of any steps have handrails. Any raised decks and porches should have guardrails on the edges. Have any leaves, snow, or ice cleared regularly. Use sand or salt on walking paths during winter. Clean up any spills in your garage right away. This includes oil or grease spills. What can I do in the bathroom? Use night lights. Install grab bars by the toilet and in the tub and shower. Do not use towel bars as grab bars. Use non-skid mats or decals in the tub or shower. If you need to sit down in the shower, use a plastic, non-slip stool. Keep the floor dry. Clean up any water that spills on the floor as soon as it happens. Remove soap buildup in the tub or shower regularly. Attach bath mats securely with double-sided non-slip rug tape. Do not have throw rugs and other things on the  floor that can make you trip. What can I do in the bedroom? Use night lights. Make sure that you have a light by your bed that is easy to reach. Do not use any sheets or blankets that are too big for your bed. They should not hang down onto the floor. Have a firm chair that has side arms. You can use this for support while you get dressed. Do not have throw rugs and other things on the floor that can make you trip. What can I do in the kitchen? Clean up any spills right away. Avoid walking on wet floors. Keep items that you use a lot in easy-to-reach places. If you need to reach something above you, use a strong step stool that has a grab bar. Keep electrical cords out of the way. Do not use floor polish or wax that makes floors slippery. If you must use wax, use non-skid floor wax. Do not have throw rugs and other things on the floor that can make you trip. What can I do with my stairs? Do not leave any items on the stairs. Make sure that there are handrails  on both sides of the stairs and use them. Fix handrails that are broken or loose. Make sure that handrails are as long as the stairways. Check any carpeting to make sure that it is firmly attached to the stairs. Fix any carpet that is loose or worn. Avoid having throw rugs at the top or bottom of the stairs. If you do have throw rugs, attach them to the floor with carpet tape. Make sure that you have a light switch at the top of the stairs and the bottom of the stairs. If you do not have them, ask someone to add them for you. What else can I do to help prevent falls? Wear shoes that: Do not have high heels. Have rubber bottoms. Are comfortable and fit you well. Are closed at the toe. Do not wear sandals. If you use a stepladder: Make sure that it is fully opened. Do not climb a closed stepladder. Make sure that both sides of the stepladder are locked into place. Ask someone to hold it for you, if possible. Clearly mark and make  sure that you can see: Any grab bars or handrails. First and last steps. Where the edge of each step is. Use tools that help you move around (mobility aids) if they are needed. These include: Canes. Walkers. Scooters. Crutches. Turn on the lights when you go into a dark area. Replace any light bulbs as soon as they burn out. Set up your furniture so you have a clear path. Avoid moving your furniture around. If any of your floors are uneven, fix them. If there are any pets around you, be aware of where they are. Review your medicines with your doctor. Some medicines can make you feel dizzy. This can increase your chance of falling. Ask your doctor what other things that you can do to help prevent falls. This information is not intended to replace advice given to you by your health care provider. Make sure you discuss any questions you have with your health care provider. Document Released: 01/01/2009 Document Revised: 08/13/2015 Document Reviewed: 04/11/2014 Elsevier Interactive Patient Education  2017 [employer].

## 2022-06-22 NOTE — Telephone Encounter (Signed)
Please try home health IVIG 2mg /kg loading dose over 4 days and then maintenance 1g/kg over 2 days every 3 weeks x12 treatment total

## 2022-06-23 ENCOUNTER — Ambulatory Visit
Admission: RE | Admit: 2022-06-23 | Discharge: 2022-06-23 | Disposition: A | Discharge status: Home / Self Care | Payer: Medicare HMO | Source: Ambulatory Visit | Attending: Radiation Oncology | Admitting: Radiation Oncology

## 2022-06-23 ENCOUNTER — Other Ambulatory Visit: Payer: Self-pay

## 2022-06-23 DIAGNOSIS — R059 Cough, unspecified: Secondary | ICD-10-CM | POA: Diagnosis not present

## 2022-06-23 DIAGNOSIS — Z510 Encounter for antineoplastic radiation therapy: Secondary | ICD-10-CM | POA: Diagnosis not present

## 2022-06-23 DIAGNOSIS — C37 Malignant neoplasm of thymus: Secondary | ICD-10-CM | POA: Diagnosis not present

## 2022-06-23 DIAGNOSIS — R0602 Shortness of breath: Secondary | ICD-10-CM | POA: Diagnosis not present

## 2022-06-23 LAB — RAD ONC ARIA SESSION SUMMARY
Course Elapsed Days: 14
Plan Fractions Treated to Date: 11
Plan Prescribed Dose Per Fraction: 1.8 Gy
Plan Total Fractions Prescribed: 30
Plan Total Prescribed Dose: 54 Gy
Reference Point Dosage Given to Date: 19.8 Gy
Reference Point Session Dosage Given: 1.8 Gy
Session Number: 11

## 2022-06-24 ENCOUNTER — Other Ambulatory Visit: Payer: Self-pay

## 2022-06-24 ENCOUNTER — Ambulatory Visit
Admission: RE | Admit: 2022-06-24 | Discharge: 2022-06-24 | Disposition: A | Discharge status: Home / Self Care | Payer: Medicare HMO | Source: Ambulatory Visit | Attending: Radiation Oncology | Admitting: Radiation Oncology

## 2022-06-24 DIAGNOSIS — Z510 Encounter for antineoplastic radiation therapy: Secondary | ICD-10-CM | POA: Diagnosis not present

## 2022-06-24 DIAGNOSIS — R059 Cough, unspecified: Secondary | ICD-10-CM | POA: Diagnosis not present

## 2022-06-24 DIAGNOSIS — R0602 Shortness of breath: Secondary | ICD-10-CM | POA: Diagnosis not present

## 2022-06-24 DIAGNOSIS — C37 Malignant neoplasm of thymus: Secondary | ICD-10-CM | POA: Diagnosis not present

## 2022-06-24 LAB — RAD ONC ARIA SESSION SUMMARY
Course Elapsed Days: 15
Plan Fractions Treated to Date: 12
Plan Prescribed Dose Per Fraction: 1.8 Gy
Plan Total Fractions Prescribed: 30
Plan Total Prescribed Dose: 54 Gy
Reference Point Dosage Given to Date: 21.6 Gy
Reference Point Session Dosage Given: 1.8 Gy
Session Number: 12

## 2022-06-25 ENCOUNTER — Other Ambulatory Visit: Payer: Self-pay | Admitting: Physician Assistant

## 2022-06-27 ENCOUNTER — Other Ambulatory Visit: Payer: Self-pay

## 2022-06-27 ENCOUNTER — Ambulatory Visit
Admission: RE | Admit: 2022-06-27 | Discharge: 2022-06-27 | Disposition: A | Discharge status: Home / Self Care | Payer: Medicare HMO | Source: Ambulatory Visit | Attending: Radiation Oncology | Admitting: Radiation Oncology

## 2022-06-27 DIAGNOSIS — Z510 Encounter for antineoplastic radiation therapy: Secondary | ICD-10-CM | POA: Diagnosis not present

## 2022-06-27 DIAGNOSIS — R0602 Shortness of breath: Secondary | ICD-10-CM | POA: Diagnosis not present

## 2022-06-27 DIAGNOSIS — R059 Cough, unspecified: Secondary | ICD-10-CM | POA: Diagnosis not present

## 2022-06-27 DIAGNOSIS — C37 Malignant neoplasm of thymus: Secondary | ICD-10-CM | POA: Diagnosis not present

## 2022-06-27 LAB — RAD ONC ARIA SESSION SUMMARY
Course Elapsed Days: 18
Plan Fractions Treated to Date: 13
Plan Prescribed Dose Per Fraction: 1.8 Gy
Plan Total Fractions Prescribed: 30
Plan Total Prescribed Dose: 54 Gy
Reference Point Dosage Given to Date: 23.4 Gy
Reference Point Session Dosage Given: 1.8 Gy
Session Number: 13

## 2022-06-28 ENCOUNTER — Ambulatory Visit
Admission: RE | Admit: 2022-06-28 | Discharge: 2022-06-28 | Disposition: A | Discharge status: Home / Self Care | Payer: Medicare HMO | Source: Ambulatory Visit | Attending: Radiation Oncology | Admitting: Radiation Oncology

## 2022-06-28 ENCOUNTER — Inpatient Hospital Stay: Payer: Medicare HMO | Attending: Hematology & Oncology

## 2022-06-28 ENCOUNTER — Encounter: Payer: Self-pay | Admitting: Medical Oncology

## 2022-06-28 ENCOUNTER — Other Ambulatory Visit: Payer: Self-pay

## 2022-06-28 ENCOUNTER — Inpatient Hospital Stay (HOSPITAL_BASED_OUTPATIENT_CLINIC_OR_DEPARTMENT_OTHER): Payer: Medicare HMO | Admitting: Medical Oncology

## 2022-06-28 VITALS — BP 162/87 | HR 95 | Temp 98.2°F | Wt 116.0 lb

## 2022-06-28 DIAGNOSIS — Z85238 Personal history of other malignant neoplasm of thymus: Secondary | ICD-10-CM | POA: Insufficient documentation

## 2022-06-28 DIAGNOSIS — Z7952 Long term (current) use of systemic steroids: Secondary | ICD-10-CM | POA: Diagnosis not present

## 2022-06-28 DIAGNOSIS — I82409 Acute embolism and thrombosis of unspecified deep veins of unspecified lower extremity: Secondary | ICD-10-CM | POA: Diagnosis not present

## 2022-06-28 DIAGNOSIS — Z9013 Acquired absence of bilateral breasts and nipples: Secondary | ICD-10-CM | POA: Diagnosis not present

## 2022-06-28 DIAGNOSIS — Z86000 Personal history of in-situ neoplasm of breast: Secondary | ICD-10-CM | POA: Insufficient documentation

## 2022-06-28 DIAGNOSIS — C37 Malignant neoplasm of thymus: Secondary | ICD-10-CM | POA: Diagnosis not present

## 2022-06-28 DIAGNOSIS — R0602 Shortness of breath: Secondary | ICD-10-CM | POA: Diagnosis not present

## 2022-06-28 DIAGNOSIS — R600 Localized edema: Secondary | ICD-10-CM | POA: Insufficient documentation

## 2022-06-28 DIAGNOSIS — Z7901 Long term (current) use of anticoagulants: Secondary | ICD-10-CM | POA: Insufficient documentation

## 2022-06-28 DIAGNOSIS — Z510 Encounter for antineoplastic radiation therapy: Secondary | ICD-10-CM | POA: Diagnosis not present

## 2022-06-28 DIAGNOSIS — R059 Cough, unspecified: Secondary | ICD-10-CM | POA: Diagnosis not present

## 2022-06-28 DIAGNOSIS — I2699 Other pulmonary embolism without acute cor pulmonale: Secondary | ICD-10-CM

## 2022-06-28 LAB — CBC WITH DIFFERENTIAL (CANCER CENTER ONLY)
Abs Immature Granulocytes: 0.3 10*3/uL — ABNORMAL HIGH (ref 0.00–0.07)
Basophils Absolute: 0 10*3/uL (ref 0.0–0.1)
Basophils Relative: 0 %
Eosinophils Absolute: 0.1 10*3/uL (ref 0.0–0.5)
Eosinophils Relative: 0 %
HCT: 40 % (ref 36.0–46.0)
Hemoglobin: 13 g/dL (ref 12.0–15.0)
Immature Granulocytes: 2 %
Lymphocytes Relative: 3 %
Lymphs Abs: 0.5 10*3/uL — ABNORMAL LOW (ref 0.7–4.0)
MCH: 31.7 pg (ref 26.0–34.0)
MCHC: 32.5 g/dL (ref 30.0–36.0)
MCV: 97.6 fL (ref 80.0–100.0)
Monocytes Absolute: 1.1 10*3/uL — ABNORMAL HIGH (ref 0.1–1.0)
Monocytes Relative: 7 %
Neutro Abs: 13.8 10*3/uL — ABNORMAL HIGH (ref 1.7–7.7)
Neutrophils Relative %: 88 %
Platelet Count: 259 10*3/uL (ref 150–400)
RBC: 4.1 MIL/uL (ref 3.87–5.11)
RDW: 16.8 % — ABNORMAL HIGH (ref 11.5–15.5)
WBC Count: 15.7 10*3/uL — ABNORMAL HIGH (ref 4.0–10.5)
nRBC: 0 % (ref 0.0–0.2)

## 2022-06-28 LAB — CMP (CANCER CENTER ONLY)
ALT: 31 U/L (ref 0–44)
AST: 19 U/L (ref 15–41)
Albumin: 3.5 g/dL (ref 3.5–5.0)
Alkaline Phosphatase: 68 U/L (ref 38–126)
Anion gap: 8 (ref 5–15)
BUN: 19 mg/dL (ref 8–23)
CO2: 32 mmol/L (ref 22–32)
Calcium: 9.5 mg/dL (ref 8.9–10.3)
Chloride: 103 mmol/L (ref 98–111)
Creatinine: 0.66 mg/dL (ref 0.44–1.00)
GFR, Estimated: >60 mL/min (ref >60)
Glucose, Bld: 103 mg/dL — ABNORMAL HIGH (ref 70–99)
Potassium: 4.2 mmol/L (ref 3.5–5.1)
Sodium: 143 mmol/L (ref 135–145)
Total Bilirubin: 0.7 mg/dL (ref 0.3–1.2)
Total Protein: 5.9 g/dL — ABNORMAL LOW (ref 6.5–8.1)

## 2022-06-28 LAB — RAD ONC ARIA SESSION SUMMARY
Course Elapsed Days: 19
Plan Fractions Treated to Date: 14
Plan Prescribed Dose Per Fraction: 1.8 Gy
Plan Total Fractions Prescribed: 30
Plan Total Prescribed Dose: 54 Gy
Reference Point Dosage Given to Date: 25.2 Gy
Reference Point Session Dosage Given: 1.8 Gy
Session Number: 14

## 2022-06-28 MED ORDER — APIXABAN 5 MG PO TABS: 5.0000 mg | ORAL_TABLET | Freq: Two times a day (BID) | ORAL | 2 refills | Status: DC

## 2022-06-28 NOTE — Progress Notes (Signed)
Hematology and Oncology Follow Up Visit  Faith Massey [id number] [birth year] 77 y.o. 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   Faith Massey was recently found to have a pulmonary embolus on 06/14/2022- isolated subsegmental of the left upper lobe.  This occurred with shortness of breath and chest discomfort.  She was started on Eliquis which she is tolerating well except for some bruising after she tore her bicep recently.  Is resolving.  She reports that her shortness of breath is back to how it was 3 surgery and she has not had any chest pain since.  She does have mild peripheral edema of ankles/feet. Not worsening.  She does not use any hormones, she is not a smoker, has no clotting history, no known clotting diseases.  Her thymoma has been surgically resected with no other known malignancies. Given her recent surgery this appears to be provoked.      Wt Readings from Last 3 Encounters:  06/28/22 116 lb (52.6 kg)  06/22/22 114 lb (51.7 kg)  06/20/22 114 lb 3.2 oz (51.8 kg)     Medications:   Current Outpatient Medications:    APIXABAN (ELIQUIS) VTE STARTER PACK (10MG  AND 5MG ), Take as directed on package: start with two-5mg  tablets twice daily for 7 days. On day 8, switch to one-5mg  tablet twice daily., Disp: 1 each, Rfl: 0   aspirin EC 81 MG tablet, Take 1 tablet (81 mg total) by mouth daily. Swallow whole., Disp: 30 tablet, Rfl: 12   Cholecalciferol (VITAMIN D-3) 5000 UNITS TABS, Take 5,000 Units by mouth daily., Disp: , Rfl:    COLLAGEN PO, Take 1 Scoop by mouth daily., Disp: , Rfl:    doxycycline  (VIBRAMYCIN) 100 MG capsule, Take 1 capsule (100 mg total) by mouth 2 (two) times daily., Disp: 20 capsule, Rfl: 0   ezetimibe (ZETIA) 10 MG tablet, Take 1 tablet (10 mg total) by mouth daily., Disp: 30 tablet, Rfl: 3   hydrOXYzine (ATARAX) 10 MG tablet, 1- 2 tab qhs prn, Disp: 60 tablet, Rfl: 3   lisinopril (ZESTRIL) 40 MG tablet, Take 1 tablet (40 mg total) by mouth daily., Disp: 90 tablet, Rfl: 3   Multiple Vitamins-Minerals (MULTIVITAMIN WITH MINERALS) tablet, Take 1 tablet by mouth daily., Disp: , Rfl:    nystatin (MYCOSTATIN) 100000 UNIT/ML suspension, Take 5 mLs (500,000 Units total) by mouth 4 (four) times daily., Disp: 500 mL, Rfl: 1   predniSONE (DELTASONE) 5 MG tablet, Take 4 tablets (20 mg total) by mouth daily with breakfast., Disp: 120 tablet, Rfl: 6   pyridostigmine (MESTINON) 60 MG tablet, Take 1 tablet (60 mg total) by mouth 3 (three) times daily., Disp: 90 tablet, Rfl: 6   traMADol (ULTRAM) 50 MG tablet, Take 1 tablet (50 mg total) by mouth every 6 (six) hours as needed (mild pain)., Disp: 28 tablet, Rfl: 0   predniSONE (DELTASONE) 10 MG tablet, 4 tabs qam xone week, 3.5 tabs qam xone week 3 tabs qam xone week 2.5 tab qam xone week 2 tabs qam, Disp: 120 tablet, Rfl: 3  Allergies:  Allergies  Allergen Reactions   Prednisone Dermatitis, Hypertension, Palpitations, Shortness Of Breath and Swelling    Patient reports having an intolerance to medication. Reports medication caused swelling and metallic taste in mouth.    Past Medical History, Surgical history, Social history, and Family History were reviewed and updated.  Review of Systems: Review of Systems - Oncology  As stated above in HPI  Physical Exam:  weight is 116 lb (52.6 kg). Her oral temperature is 98.2 F (36.8 C). Her pulse is 95. Her oxygen saturation is 95%.   Physical Exam General: NAD Cardiovascular: regular rate and rhythm, trace to 1+ peripheral pitting edema of ankles Pulmonary: clear ant  fields Extremities: no edema, no joint deformities Skin: Large area of ecchymosis of the right bicep area which appears to be resolving. No other significant bruising noted Neurological: Weakness but otherwise nonfocal   Lab Results  Component Value Date   WBC 15.7 (H) 06/28/2022   HGB 13.0 06/28/2022   HCT 40.0 06/28/2022   MCV 97.6 06/28/2022   PLT 259 06/28/2022     Chemistry      Component Value Date/Time   NA 143 06/28/2022 1014   NA 143 05/11/2022 1514   K 4.2 06/28/2022 1014   CL 103 06/28/2022 1014   CO2 32 06/28/2022 1014   BUN 19 06/28/2022 1014   BUN 15 05/11/2022 1514   CREATININE 0.66 06/28/2022 1014   CREATININE 0.61 05/06/2013 1003      Component Value Date/Time   CALCIUM 9.5 06/28/2022 1014   ALKPHOS 68 06/28/2022 1014   AST 19 06/28/2022 1014   ALT 31 06/28/2022 1014   BILITOT 0.7 06/28/2022 1014      Assessment and Plan- Patient is a 77 y.o. female    Encounter Diagnoses  Name Primary?   Type B2 thymoma Yes   Acute pulmonary embolism without acute cor pulmonale, unspecified pulmonary embolism type    Patient presents today to discuss her recent pulmonary embolism. This appears to have been provoked secondary to her recent surgery. We discussed provoked vs unprovoked DVT/PE along with management. She is doing well on the Eliquis - we will have her continue this for at least 3 months. We discussed testing options to assess for other potential causes. She will consider this in the interim between now and her next visit. For now our focus will be to continue to treat her for her PE. No repeat CT scan needed at this time.    #Provoked DVT/Pulmonary Embolism --findings at this time are consistent with a provoked VTE --will order baseline CMP and CBC to assure labs are adequate for DOAC therapy --recommend the patient continue eliquis 10mg  BID x 7 days followed by 5mg  BID for 3-6 months. Per Dr. Patsy Lager she is to continue her 81 mg asa as well.  --patient  denies any bleeding, bruising, or dark stools on this medication. It is well tolerated. No difficulties accessing/affording the medication --RTC in 2 weeks to ensure her SOB is starting to improve and that her leg edema has not worsened. CBC to check Hgb again given recent bruising. Discussed red flags.  Goal follow up after next visit would be in 2 months  (has follow up in May with Dr. Rexene Edison in regards to her Thymoma)   Disposition: Refilling her Eliquis today 2 weeks APP, labs   Clent Jacks PA-C 4/9/20241:25 PM

## 2022-06-29 ENCOUNTER — Ambulatory Visit
Admission: RE | Admit: 2022-06-29 | Discharge: 2022-06-29 | Disposition: A | Discharge status: Home / Self Care | Payer: Medicare HMO | Source: Ambulatory Visit | Attending: Radiation Oncology | Admitting: Radiation Oncology

## 2022-06-29 ENCOUNTER — Inpatient Hospital Stay: Payer: Medicare HMO | Admitting: Licensed Clinical Social Worker

## 2022-06-29 ENCOUNTER — Other Ambulatory Visit: Payer: Self-pay

## 2022-06-29 DIAGNOSIS — Z510 Encounter for antineoplastic radiation therapy: Secondary | ICD-10-CM | POA: Diagnosis not present

## 2022-06-29 DIAGNOSIS — C37 Malignant neoplasm of thymus: Secondary | ICD-10-CM | POA: Diagnosis not present

## 2022-06-29 DIAGNOSIS — R059 Cough, unspecified: Secondary | ICD-10-CM | POA: Diagnosis not present

## 2022-06-29 DIAGNOSIS — R0602 Shortness of breath: Secondary | ICD-10-CM | POA: Diagnosis not present

## 2022-06-29 LAB — RAD ONC ARIA SESSION SUMMARY
Course Elapsed Days: 20
Plan Fractions Treated to Date: 15
Plan Prescribed Dose Per Fraction: 1.8 Gy
Plan Total Fractions Prescribed: 30
Plan Total Prescribed Dose: 54 Gy
Reference Point Dosage Given to Date: 27 Gy
Reference Point Session Dosage Given: 1.8 Gy
Session Number: 15

## 2022-06-29 NOTE — Progress Notes (Signed)
[facility] CSW Progress Note  Clinical Child psychotherapist contacted patient by phone to assess needs.  She stated she will be receiving IVIG therapy through Dr. Terrace Arabia and is concerned about her finances.  Discussed with Dr. Zannie Cove RN, Doretha Sou, who is also aware of patient's financial stress.  CSW to meet with patient tomorrow at radiology to provide her remaining two [employer].  Also provided [employer] contact information.    Faith Lux Hyden Soley, LCSW

## 2022-06-29 NOTE — Telephone Encounter (Signed)
Called and spoke to patient about at home infusions regarding questions she had and she is agreeable to plan. Received PA paperwork from [employer]) for PA for the IVIG. Pt verbalized understanding. Pt had no questions at this time but was encouraged to call back if questions arise.

## 2022-06-30 ENCOUNTER — Other Ambulatory Visit: Payer: Self-pay

## 2022-06-30 ENCOUNTER — Ambulatory Visit
Admission: RE | Admit: 2022-06-30 | Discharge: 2022-06-30 | Disposition: A | Discharge status: Home / Self Care | Payer: Medicare HMO | Source: Ambulatory Visit | Attending: Radiation Oncology | Admitting: Radiation Oncology

## 2022-06-30 ENCOUNTER — Inpatient Hospital Stay: Payer: Medicare HMO

## 2022-06-30 DIAGNOSIS — R0602 Shortness of breath: Secondary | ICD-10-CM | POA: Diagnosis not present

## 2022-06-30 DIAGNOSIS — R059 Cough, unspecified: Secondary | ICD-10-CM | POA: Diagnosis not present

## 2022-06-30 DIAGNOSIS — Z510 Encounter for antineoplastic radiation therapy: Secondary | ICD-10-CM | POA: Diagnosis not present

## 2022-06-30 DIAGNOSIS — C37 Malignant neoplasm of thymus: Secondary | ICD-10-CM | POA: Diagnosis not present

## 2022-06-30 LAB — RAD ONC ARIA SESSION SUMMARY
Course Elapsed Days: 21
Plan Fractions Treated to Date: 16
Plan Prescribed Dose Per Fraction: 1.8 Gy
Plan Total Fractions Prescribed: 30
Plan Total Prescribed Dose: 54 Gy
Reference Point Dosage Given to Date: 28.8 Gy
Reference Point Session Dosage Given: 1.8 Gy
Session Number: 16

## 2022-06-30 NOTE — Telephone Encounter (Signed)
Pt called to check the status of infusion. I stated that we faxed the forms and are awiting determination she voiced gratitude and understanding.

## 2022-06-30 NOTE — Progress Notes (Signed)
[facility] CSW Progress Note  Visual merchandiser met with patient to assess needs.  Provided patient with her final [employer] Cards (2).  She received a call from Dr. Zannie Cove RN yesterday and they are working on her IVIG treatment cost.  She complained about insomnia recently.  She expressed no other needs.    Pascal Lux Shuaib Corsino, LCSW

## 2022-07-01 ENCOUNTER — Other Ambulatory Visit: Payer: Self-pay

## 2022-07-01 ENCOUNTER — Ambulatory Visit
Admission: RE | Admit: 2022-07-01 | Discharge: 2022-07-01 | Disposition: A | Discharge status: Home / Self Care | Payer: Medicare HMO | Source: Ambulatory Visit | Attending: Radiation Oncology | Admitting: Radiation Oncology

## 2022-07-01 DIAGNOSIS — Z510 Encounter for antineoplastic radiation therapy: Secondary | ICD-10-CM | POA: Diagnosis not present

## 2022-07-01 DIAGNOSIS — R0602 Shortness of breath: Secondary | ICD-10-CM | POA: Diagnosis not present

## 2022-07-01 DIAGNOSIS — C37 Malignant neoplasm of thymus: Secondary | ICD-10-CM | POA: Diagnosis not present

## 2022-07-01 DIAGNOSIS — R059 Cough, unspecified: Secondary | ICD-10-CM | POA: Diagnosis not present

## 2022-07-01 LAB — RAD ONC ARIA SESSION SUMMARY
Course Elapsed Days: 22
Plan Fractions Treated to Date: 17
Plan Prescribed Dose Per Fraction: 1.8 Gy
Plan Total Fractions Prescribed: 30
Plan Total Prescribed Dose: 54 Gy
Reference Point Dosage Given to Date: 30.6 Gy
Reference Point Session Dosage Given: 1.8 Gy
Session Number: 17

## 2022-07-04 ENCOUNTER — Ambulatory Visit
Admission: RE | Admit: 2022-07-04 | Discharge: 2022-07-04 | Disposition: A | Discharge status: Home / Self Care | Payer: Medicare HMO | Source: Ambulatory Visit | Attending: Radiation Oncology | Admitting: Radiation Oncology

## 2022-07-04 ENCOUNTER — Other Ambulatory Visit: Payer: Self-pay

## 2022-07-04 ENCOUNTER — Ambulatory Visit: Payer: Medicare HMO

## 2022-07-04 DIAGNOSIS — R059 Cough, unspecified: Secondary | ICD-10-CM | POA: Diagnosis not present

## 2022-07-04 DIAGNOSIS — Z510 Encounter for antineoplastic radiation therapy: Secondary | ICD-10-CM | POA: Diagnosis not present

## 2022-07-04 DIAGNOSIS — R0602 Shortness of breath: Secondary | ICD-10-CM | POA: Diagnosis not present

## 2022-07-04 DIAGNOSIS — C37 Malignant neoplasm of thymus: Secondary | ICD-10-CM | POA: Diagnosis not present

## 2022-07-04 LAB — RAD ONC ARIA SESSION SUMMARY
Course Elapsed Days: 25
Plan Fractions Treated to Date: 18
Plan Prescribed Dose Per Fraction: 1.8 Gy
Plan Total Fractions Prescribed: 30
Plan Total Prescribed Dose: 54 Gy
Reference Point Dosage Given to Date: 32.4 Gy
Reference Point Session Dosage Given: 1.8 Gy
Session Number: 18

## 2022-07-05 ENCOUNTER — Other Ambulatory Visit: Payer: Self-pay

## 2022-07-05 ENCOUNTER — Encounter: Payer: Self-pay | Admitting: *Deleted

## 2022-07-05 ENCOUNTER — Ambulatory Visit
Admission: RE | Admit: 2022-07-05 | Discharge: 2022-07-05 | Disposition: A | Discharge status: Home / Self Care | Payer: Medicare HMO | Source: Ambulatory Visit | Attending: Radiation Oncology | Admitting: Radiation Oncology

## 2022-07-05 DIAGNOSIS — Z510 Encounter for antineoplastic radiation therapy: Secondary | ICD-10-CM | POA: Diagnosis not present

## 2022-07-05 DIAGNOSIS — R0602 Shortness of breath: Secondary | ICD-10-CM | POA: Diagnosis not present

## 2022-07-05 DIAGNOSIS — R059 Cough, unspecified: Secondary | ICD-10-CM | POA: Diagnosis not present

## 2022-07-05 DIAGNOSIS — C37 Malignant neoplasm of thymus: Secondary | ICD-10-CM | POA: Diagnosis not present

## 2022-07-05 LAB — RAD ONC ARIA SESSION SUMMARY
Course Elapsed Days: 26
Plan Fractions Treated to Date: 19
Plan Prescribed Dose Per Fraction: 1.8 Gy
Plan Total Fractions Prescribed: 30
Plan Total Prescribed Dose: 54 Gy
Reference Point Dosage Given to Date: 34.2 Gy
Reference Point Session Dosage Given: 1.8 Gy
Session Number: 19

## 2022-07-05 NOTE — Progress Notes (Signed)
Patient calling with questions regarding clarification of her diagnosis. She continues to look for financial aid for her IVIG which her neurologist has ordered for her Myasthenia Gravis. She wonders if there is an cancer related foundation which may help. Explained that since her IVIG is being prescribed for her MG and most foundation help is diagnosis driven, she would need to find MG funding and work with her neurologist office.   There is also question about her 2 upcoming appointments that are just one week apart. Cancelled her 07/11/2022 appointment and we will follow up with her on 07/21/2022. She will be done radiation at that point.  Oncology Nurse Navigator Documentation     07/05/2022    9:45 AM  Oncology Nurse Navigator Flowsheets  Navigator Follow Up Date: 07/21/2022  Navigator Follow Up Reason: Follow-up Appointment  Navigator Location [facility]  Navigator Encounter Type Telephone;Appt/Treatment Plan Review  Telephone Incoming Call  Patient Visit Type MedOnc  Treatment Phase Active Tx  Barriers/Navigation Needs Coordination of Care;Education  Interventions Education;Psycho-Social Support  Acuity Level 2-Minimal Needs (1-2 Barriers Identified)  Education Method Verbal  Support Groups/Services Friends and Family  Time Spent with Patient 15

## 2022-07-06 ENCOUNTER — Inpatient Hospital Stay: Payer: Medicare HMO

## 2022-07-06 ENCOUNTER — Ambulatory Visit
Admission: RE | Admit: 2022-07-06 | Discharge: 2022-07-06 | Disposition: A | Discharge status: Home / Self Care | Payer: Medicare HMO | Source: Ambulatory Visit | Attending: Radiation Oncology | Admitting: Radiation Oncology

## 2022-07-06 ENCOUNTER — Other Ambulatory Visit: Payer: Self-pay

## 2022-07-06 DIAGNOSIS — Z510 Encounter for antineoplastic radiation therapy: Secondary | ICD-10-CM | POA: Diagnosis not present

## 2022-07-06 DIAGNOSIS — C37 Malignant neoplasm of thymus: Secondary | ICD-10-CM | POA: Diagnosis not present

## 2022-07-06 DIAGNOSIS — R059 Cough, unspecified: Secondary | ICD-10-CM | POA: Diagnosis not present

## 2022-07-06 DIAGNOSIS — R0602 Shortness of breath: Secondary | ICD-10-CM | POA: Diagnosis not present

## 2022-07-06 LAB — RAD ONC ARIA SESSION SUMMARY
Course Elapsed Days: 27
Plan Fractions Treated to Date: 20
Plan Prescribed Dose Per Fraction: 1.8 Gy
Plan Total Fractions Prescribed: 30
Plan Total Prescribed Dose: 54 Gy
Reference Point Dosage Given to Date: 36 Gy
Reference Point Session Dosage Given: 1.8 Gy
Session Number: 20

## 2022-07-07 ENCOUNTER — Other Ambulatory Visit: Payer: Self-pay | Admitting: Radiology

## 2022-07-07 ENCOUNTER — Telehealth: Payer: Self-pay

## 2022-07-07 ENCOUNTER — Ambulatory Visit
Admission: RE | Admit: 2022-07-07 | Discharge: 2022-07-07 | Disposition: A | Discharge status: Home / Self Care | Payer: Medicare HMO | Source: Ambulatory Visit | Attending: Radiation Oncology | Admitting: Radiation Oncology

## 2022-07-07 ENCOUNTER — Ambulatory Visit (HOSPITAL_COMMUNITY)
Admission: RE | Admit: 2022-07-07 | Discharge: 2022-07-07 | Disposition: A | Discharge status: Home / Self Care | Payer: Medicare HMO | Source: Ambulatory Visit | Attending: Radiation Oncology | Admitting: Radiation Oncology

## 2022-07-07 ENCOUNTER — Ambulatory Visit: Payer: Medicare HMO | Admitting: Neurology

## 2022-07-07 ENCOUNTER — Other Ambulatory Visit: Payer: Self-pay

## 2022-07-07 DIAGNOSIS — J9811 Atelectasis: Secondary | ICD-10-CM | POA: Diagnosis not present

## 2022-07-07 DIAGNOSIS — Z510 Encounter for antineoplastic radiation therapy: Secondary | ICD-10-CM | POA: Diagnosis not present

## 2022-07-07 DIAGNOSIS — R0602 Shortness of breath: Secondary | ICD-10-CM | POA: Diagnosis not present

## 2022-07-07 DIAGNOSIS — D4989 Neoplasm of unspecified behavior of other specified sites: Secondary | ICD-10-CM

## 2022-07-07 DIAGNOSIS — J189 Pneumonia, unspecified organism: Secondary | ICD-10-CM

## 2022-07-07 DIAGNOSIS — C37 Malignant neoplasm of thymus: Secondary | ICD-10-CM | POA: Diagnosis not present

## 2022-07-07 DIAGNOSIS — R059 Cough, unspecified: Secondary | ICD-10-CM | POA: Diagnosis not present

## 2022-07-07 LAB — RAD ONC ARIA SESSION SUMMARY
Course Elapsed Days: 28
Plan Fractions Treated to Date: 21
Plan Prescribed Dose Per Fraction: 1.8 Gy
Plan Total Fractions Prescribed: 30
Plan Total Prescribed Dose: 54 Gy
Reference Point Dosage Given to Date: 37.8 Gy
Reference Point Session Dosage Given: 1.8 Gy
Session Number: 21

## 2022-07-07 MED ORDER — DOXYCYCLINE HYCLATE 100 MG PO TABS
100.0000 mg | ORAL_TABLET | Freq: Two times a day (BID) | ORAL | 0 refills | Status: AC
Start: 2022-07-07 — End: 2022-07-12

## 2022-07-07 NOTE — Progress Notes (Signed)
Patient is experiencing shortness of breath and cough during radiation treatment for thymus neoplasm. CXR performed on 07/07/22 showed a new right midline platelike atelectasis and right perihilar heterogenous airspace opacification, suspicious for pneumonia. I am sending a 5 day supply of doxycycline  q12h to cover for community acquired pneumonia.     Joyice Faster, PA-C

## 2022-07-07 NOTE — Telephone Encounter (Signed)
Pt called in stated that she was approved for at ivig and that she has the funding for it.

## 2022-07-07 NOTE — Telephone Encounter (Signed)
Spoke with patients son whom was able to get in touch with patient called back and was informed of chest xray results and new order for antibiotic. Patient voiced understanding.

## 2022-07-07 NOTE — Telephone Encounter (Signed)
Called placed to patient to make aware of chest xray results per [facility]. No answer, unable to leave voicemail. Will continue to call.

## 2022-07-08 ENCOUNTER — Ambulatory Visit
Admission: RE | Admit: 2022-07-08 | Discharge: 2022-07-08 | Disposition: A | Discharge status: Home / Self Care | Payer: Medicare HMO | Source: Ambulatory Visit | Attending: Radiation Oncology | Admitting: Radiation Oncology

## 2022-07-08 ENCOUNTER — Other Ambulatory Visit: Payer: Self-pay

## 2022-07-08 DIAGNOSIS — Z510 Encounter for antineoplastic radiation therapy: Secondary | ICD-10-CM | POA: Diagnosis not present

## 2022-07-08 DIAGNOSIS — R0602 Shortness of breath: Secondary | ICD-10-CM | POA: Diagnosis not present

## 2022-07-08 DIAGNOSIS — C37 Malignant neoplasm of thymus: Secondary | ICD-10-CM | POA: Diagnosis not present

## 2022-07-08 DIAGNOSIS — R059 Cough, unspecified: Secondary | ICD-10-CM | POA: Diagnosis not present

## 2022-07-08 LAB — RAD ONC ARIA SESSION SUMMARY
Course Elapsed Days: 29
Plan Fractions Treated to Date: 22
Plan Prescribed Dose Per Fraction: 1.8 Gy
Plan Total Fractions Prescribed: 30
Plan Total Prescribed Dose: 54 Gy
Reference Point Dosage Given to Date: 39.6 Gy
Reference Point Session Dosage Given: 1.8 Gy
Session Number: 22

## 2022-07-11 ENCOUNTER — Ambulatory Visit: Payer: Medicare HMO

## 2022-07-11 ENCOUNTER — Other Ambulatory Visit: Payer: Medicare HMO

## 2022-07-11 ENCOUNTER — Ambulatory Visit: Payer: Medicare HMO | Admitting: Medical Oncology

## 2022-07-11 ENCOUNTER — Telehealth: Payer: Self-pay | Admitting: Neurology

## 2022-07-11 NOTE — Telephone Encounter (Signed)
Deon Pilling called from [facility] and LVM regarding a fax that was sent last week for a detailed written order to be signed for the medication OCTAGAM. He would like it faxed back to him after the provider signs it at [phone] If any questions please call back at [phone]

## 2022-07-11 NOTE — Telephone Encounter (Signed)
Orders were placed in Dr. Catalina Gravel office for signature, will fax once signed

## 2022-07-12 ENCOUNTER — Other Ambulatory Visit: Payer: Medicare HMO

## 2022-07-12 ENCOUNTER — Ambulatory Visit
Admission: RE | Admit: 2022-07-12 | Discharge: 2022-07-12 | Disposition: A | Discharge status: Home / Self Care | Payer: Medicare HMO | Source: Ambulatory Visit | Attending: Radiation Oncology | Admitting: Radiation Oncology

## 2022-07-12 ENCOUNTER — Ambulatory Visit: Payer: Medicare HMO | Admitting: Medical Oncology

## 2022-07-12 ENCOUNTER — Other Ambulatory Visit: Payer: Self-pay

## 2022-07-12 DIAGNOSIS — Z510 Encounter for antineoplastic radiation therapy: Secondary | ICD-10-CM | POA: Diagnosis not present

## 2022-07-12 DIAGNOSIS — C37 Malignant neoplasm of thymus: Secondary | ICD-10-CM | POA: Diagnosis not present

## 2022-07-12 DIAGNOSIS — R0602 Shortness of breath: Secondary | ICD-10-CM | POA: Diagnosis not present

## 2022-07-12 DIAGNOSIS — R059 Cough, unspecified: Secondary | ICD-10-CM | POA: Diagnosis not present

## 2022-07-12 LAB — RAD ONC ARIA SESSION SUMMARY
Course Elapsed Days: 33
Plan Fractions Treated to Date: 23
Plan Prescribed Dose Per Fraction: 1.8 Gy
Plan Total Fractions Prescribed: 30
Plan Total Prescribed Dose: 54 Gy
Reference Point Dosage Given to Date: 41.4 Gy
Reference Point Session Dosage Given: 1.8 Gy
Session Number: 23

## 2022-07-12 NOTE — Telephone Encounter (Signed)
Orders faxed back to [facility]

## 2022-07-13 ENCOUNTER — Other Ambulatory Visit: Payer: Self-pay

## 2022-07-13 ENCOUNTER — Ambulatory Visit
Admission: RE | Admit: 2022-07-13 | Discharge: 2022-07-13 | Disposition: A | Discharge status: Home / Self Care | Payer: Medicare HMO | Source: Ambulatory Visit | Attending: Radiation Oncology | Admitting: Radiation Oncology

## 2022-07-13 ENCOUNTER — Telehealth: Payer: Self-pay | Admitting: Neurology

## 2022-07-13 DIAGNOSIS — Z510 Encounter for antineoplastic radiation therapy: Secondary | ICD-10-CM | POA: Diagnosis not present

## 2022-07-13 DIAGNOSIS — C37 Malignant neoplasm of thymus: Secondary | ICD-10-CM | POA: Diagnosis not present

## 2022-07-13 DIAGNOSIS — R0602 Shortness of breath: Secondary | ICD-10-CM | POA: Diagnosis not present

## 2022-07-13 DIAGNOSIS — R059 Cough, unspecified: Secondary | ICD-10-CM | POA: Diagnosis not present

## 2022-07-13 LAB — RAD ONC ARIA SESSION SUMMARY
Course Elapsed Days: 34
Plan Fractions Treated to Date: 24
Plan Prescribed Dose Per Fraction: 1.8 Gy
Plan Total Fractions Prescribed: 30
Plan Total Prescribed Dose: 54 Gy
Reference Point Dosage Given to Date: 43.2 Gy
Reference Point Session Dosage Given: 1.8 Gy
Session Number: 24

## 2022-07-13 NOTE — Telephone Encounter (Signed)
Pt has checked with [id number] and 5 other local pharmacies.  She is being told by the majority that pyridostigmine (MESTINON) 60 MG tablet is on b/o until 05-17th. Pt says she will be out today.  Pt is asking if there is something else that can be called in for her.

## 2022-07-14 ENCOUNTER — Other Ambulatory Visit: Payer: Self-pay

## 2022-07-14 ENCOUNTER — Emergency Department (HOSPITAL_COMMUNITY)
Admission: EM | Admit: 2022-07-14 | Discharge: 2022-07-14 | Disposition: A | Discharge status: Home / Self Care | Payer: Medicare HMO | Attending: Emergency Medicine | Admitting: Emergency Medicine

## 2022-07-14 ENCOUNTER — Inpatient Hospital Stay: Payer: Medicare HMO

## 2022-07-14 ENCOUNTER — Ambulatory Visit
Admission: RE | Admit: 2022-07-14 | Discharge: 2022-07-14 | Disposition: A | Discharge status: Home / Self Care | Payer: Medicare HMO | Source: Ambulatory Visit | Attending: Radiation Oncology | Admitting: Radiation Oncology

## 2022-07-14 ENCOUNTER — Ambulatory Visit: Payer: Medicare HMO | Admitting: Hematology & Oncology

## 2022-07-14 ENCOUNTER — Telehealth: Payer: Self-pay | Admitting: Neurology

## 2022-07-14 ENCOUNTER — Emergency Department (HOSPITAL_COMMUNITY): Payer: Medicare HMO

## 2022-07-14 DIAGNOSIS — R600 Localized edema: Secondary | ICD-10-CM | POA: Diagnosis not present

## 2022-07-14 DIAGNOSIS — Z1152 Encounter for screening for COVID-19: Secondary | ICD-10-CM | POA: Insufficient documentation

## 2022-07-14 DIAGNOSIS — D384 Neoplasm of uncertain behavior of thymus: Secondary | ICD-10-CM | POA: Insufficient documentation

## 2022-07-14 DIAGNOSIS — Z853 Personal history of malignant neoplasm of breast: Secondary | ICD-10-CM | POA: Diagnosis not present

## 2022-07-14 DIAGNOSIS — E876 Hypokalemia: Secondary | ICD-10-CM | POA: Diagnosis not present

## 2022-07-14 DIAGNOSIS — D72829 Elevated white blood cell count, unspecified: Secondary | ICD-10-CM | POA: Insufficient documentation

## 2022-07-14 DIAGNOSIS — R0602 Shortness of breath: Secondary | ICD-10-CM | POA: Diagnosis not present

## 2022-07-14 DIAGNOSIS — Z7901 Long term (current) use of anticoagulants: Secondary | ICD-10-CM | POA: Insufficient documentation

## 2022-07-14 DIAGNOSIS — J189 Pneumonia, unspecified organism: Secondary | ICD-10-CM

## 2022-07-14 DIAGNOSIS — Z7982 Long term (current) use of aspirin: Secondary | ICD-10-CM | POA: Insufficient documentation

## 2022-07-14 DIAGNOSIS — Z859 Personal history of malignant neoplasm, unspecified: Secondary | ICD-10-CM

## 2022-07-14 DIAGNOSIS — J168 Pneumonia due to other specified infectious organisms: Secondary | ICD-10-CM | POA: Diagnosis not present

## 2022-07-14 DIAGNOSIS — Z510 Encounter for antineoplastic radiation therapy: Secondary | ICD-10-CM | POA: Diagnosis not present

## 2022-07-14 DIAGNOSIS — J9811 Atelectasis: Secondary | ICD-10-CM | POA: Diagnosis not present

## 2022-07-14 DIAGNOSIS — C37 Malignant neoplasm of thymus: Secondary | ICD-10-CM | POA: Diagnosis not present

## 2022-07-14 LAB — RAD ONC ARIA SESSION SUMMARY
Course Elapsed Days: 35
Plan Fractions Treated to Date: 25
Plan Prescribed Dose Per Fraction: 1.8 Gy
Plan Total Fractions Prescribed: 30
Plan Total Prescribed Dose: 54 Gy
Reference Point Dosage Given to Date: 45 Gy
Reference Point Session Dosage Given: 1.8 Gy
Session Number: 25

## 2022-07-14 LAB — BRAIN NATRIURETIC PEPTIDE: B Natriuretic Peptide: 123.7 pg/mL — ABNORMAL HIGH (ref 0.0–100.0)

## 2022-07-14 LAB — CBC
HCT: 37 % (ref 36.0–46.0)
Hemoglobin: 12.2 g/dL (ref 12.0–15.0)
MCH: 32.3 pg (ref 26.0–34.0)
MCHC: 33 g/dL (ref 30.0–36.0)
MCV: 97.9 fL (ref 80.0–100.0)
Platelets: 257 10*3/uL (ref 150–400)
RBC: 3.78 MIL/uL — ABNORMAL LOW (ref 3.87–5.11)
RDW: 16.4 % — ABNORMAL HIGH (ref 11.5–15.5)
WBC: 11.4 10*3/uL — ABNORMAL HIGH (ref 4.0–10.5)
nRBC: 0 % (ref 0.0–0.2)

## 2022-07-14 LAB — RESP PANEL BY RT-PCR (RSV, FLU A&B, COVID)  RVPGX2
Influenza A by PCR: NEGATIVE
Influenza B by PCR: NEGATIVE
Resp Syncytial Virus by PCR: NEGATIVE
SARS Coronavirus 2 by RT PCR: NEGATIVE

## 2022-07-14 LAB — I-STAT CHEM 8, ED
BUN: 22 mg/dL (ref 8–23)
Calcium, Ion: 1.06 mmol/L — ABNORMAL LOW (ref 1.15–1.40)
Chloride: 105 mmol/L (ref 98–111)
Creatinine, Ser: 0.7 mg/dL (ref 0.44–1.00)
Glucose, Bld: 114 mg/dL — ABNORMAL HIGH (ref 70–99)
HCT: 34 % — ABNORMAL LOW (ref 36.0–46.0)
Hemoglobin: 11.6 g/dL — ABNORMAL LOW (ref 12.0–15.0)
Potassium: 3.1 mmol/L — ABNORMAL LOW (ref 3.5–5.1)
Sodium: 141 mmol/L (ref 135–145)
TCO2: 29 mmol/L (ref 22–32)

## 2022-07-14 LAB — COMPREHENSIVE METABOLIC PANEL
ALT: 31 U/L (ref 0–44)
AST: 21 U/L (ref 15–41)
Albumin: 2.9 g/dL — ABNORMAL LOW (ref 3.5–5.0)
Alkaline Phosphatase: 61 U/L (ref 38–126)
Anion gap: 10 (ref 5–15)
BUN: 25 mg/dL — ABNORMAL HIGH (ref 8–23)
CO2: 25 mmol/L (ref 22–32)
Calcium: 8.5 mg/dL — ABNORMAL LOW (ref 8.9–10.3)
Chloride: 106 mmol/L (ref 98–111)
Creatinine, Ser: 0.47 mg/dL (ref 0.44–1.00)
GFR, Estimated: >60 mL/min (ref >60)
Glucose, Bld: 112 mg/dL — ABNORMAL HIGH (ref 70–99)
Potassium: 3 mmol/L — ABNORMAL LOW (ref 3.5–5.1)
Sodium: 141 mmol/L (ref 135–145)
Total Bilirubin: 0.8 mg/dL (ref 0.3–1.2)
Total Protein: 5.5 g/dL — ABNORMAL LOW (ref 6.5–8.1)

## 2022-07-14 LAB — BLOOD GAS, VENOUS
Acid-Base Excess: 6.2 mmol/L — ABNORMAL HIGH (ref 0.0–2.0)
Bicarbonate: 30.6 mmol/L — ABNORMAL HIGH (ref 20.0–28.0)
O2 Saturation: 46.8 %
Patient temperature: 37
pCO2, Ven: 42 mmHg — ABNORMAL LOW (ref 44–60)
pH, Ven: 7.47 — ABNORMAL HIGH (ref 7.25–7.43)
pO2, Ven: <31 mmHg — CL (ref 32–45)

## 2022-07-14 LAB — PROCALCITONIN: Procalcitonin: <0.1 ng/mL

## 2022-07-14 LAB — TROPONIN I (HIGH SENSITIVITY)
Troponin I (High Sensitivity): 12 ng/L (ref <18)
Troponin I (High Sensitivity): 11 ng/L (ref <18)

## 2022-07-14 MED ORDER — SODIUM CHLORIDE 0.9 % IV SOLN: 1.0000 g | Freq: Once | INTRAVENOUS | Status: DC

## 2022-07-14 MED ORDER — AMOXICILLIN-POT CLAVULANATE 875-125 MG PO TABS: 1.0000 | ORAL_TABLET | Freq: Two times a day (BID) | ORAL | 0 refills | Status: DC

## 2022-07-14 MED ORDER — PYRIDOSTIGMINE BROMIDE 60 MG PO TABS: 60.0000 mg | ORAL_TABLET | Freq: Three times a day (TID) | ORAL | 6 refills | Status: DC

## 2022-07-14 MED ORDER — DOXYCYCLINE HYCLATE 100 MG PO CAPS: 100.0000 mg | ORAL_CAPSULE | Freq: Two times a day (BID) | ORAL | 0 refills | Status: DC

## 2022-07-14 MED ORDER — DOXYCYCLINE HYCLATE 100 MG PO TABS
100.0000 mg | ORAL_TABLET | Freq: Once | ORAL | Status: AC
  Administered 2022-07-14: 100 mg via ORAL
  Filled 2022-07-14: qty 1

## 2022-07-14 MED ORDER — IOHEXOL 350 MG/ML SOLN
80.0000 mL | Freq: Once | INTRAVENOUS | Status: AC | PRN
  Administered 2022-07-14: 80 mL via INTRAVENOUS

## 2022-07-14 MED ORDER — SODIUM CHLORIDE 0.9 % IV SOLN
1.0000 g | Freq: Once | INTRAVENOUS | Status: AC
  Administered 2022-07-14: 1 g via INTRAVENOUS
  Filled 2022-07-14: qty 10

## 2022-07-14 MED ORDER — POTASSIUM CHLORIDE CRYS ER 20 MEQ PO TBCR
40.0000 meq | EXTENDED_RELEASE_TABLET | Freq: Once | ORAL | Status: AC
  Administered 2022-07-14: 40 meq via ORAL
  Filled 2022-07-14: qty 2

## 2022-07-14 NOTE — Progress Notes (Signed)
One time order for [facility] can not be completed due to unavailability of measuring toll. However, NIF can and will be completed.

## 2022-07-14 NOTE — Telephone Encounter (Signed)
Called and spoke to pt and refilled med to desired pharmacy pt voiced gratitude and understanding

## 2022-07-14 NOTE — ED Provider Notes (Signed)
SOB one week, tx doxycycline. CTA to r/o PE, on Eliquis for previous PE. H/O myasthenia gravis. If all returns normal, ambulate with pulse ox. Physical Exam  Ht  (1.473 m)   Wt 53 kg   BMI 24.42 kg/m   Physical Exam  Procedures  Procedures  ED Course / MDM   Clinical Course as of 07/14/22 1856  Thu Jul 14, 2022  1757 Blood gas, venous [SK]  1824 Signed out to Dr. Donnald Garre [RP]    Clinical Course User Index [RP] Rondel Baton, MD [SK] Sherlyn Lick, Student-PA   Medical Decision Making Amount and/or Complexity of Data Reviewed Labs: ordered. Radiology: ordered.  Risk Prescription drug management.   CT PE study returned negative for PE with an infiltrate on the right.  I have personally reviewed this study and agree with right-sided pulmonary infiltrate toward the upper lobe.  Patient reports that she had been on doxycycline which she finished 3 days ago.  She reports she has had a slight cough still no fevers or bodyaches.  She has intermittently felt short of breath that seems to be waxing and waning.  She is currently undergoing radiation therapy and has a treatment tomorrow.  We discussed risks and benefits of hospitalization on IV antibiotics and observation overnight.  Patient's preference is to go home.  She is clinically well in appearance with clear mental status and no respiratory distress at rest.  Oxygen saturation on room air are ranging from 90 to 93%.  At this point, I have added a procalcitonin and blood cultures.  I will have the patient get an IV dose of Rocephin and plan to continue her on doxycycline and Augmentin.  Will be seen tomorrow at radiation therapy for recheck.  Careful return precautions reviewed.       Arby Barrette, MD 07/14/22 [phone]

## 2022-07-14 NOTE — Discharge Instructions (Signed)
1.  At this time your CT scan shows an area suspicious for pneumonia.  This will need to be reviewed with your primary care doctor and oncologist.  You have been given a dose of Rocephin in the emergency department.  You were also given an oral dose of doxycycline.  You should continue the doxycycline tomorrow morning and start Augmentin as prescribed tomorrow evening with your second dose of doxycycline. 2.  Blood cultures were drawn in the emergency department.  You will be called if there is a positive result requiring inpatient treatment on IV antibiotics. 3.  Return immediately if you have worsening shortness of breath, feel generally ill weak or other concerning changes.

## 2022-07-14 NOTE — Progress Notes (Signed)
Patient requested to come over to see Dr. Roselind Messier after receiving 25 of 30 radiation treatments to her chest. Patient reports worsened shortness of breath and cough. Denies any chest pain. Patient recently completed antibiotics for pneumonia. MD in and assessed patient encouraged patient to go to ED to be evaluated due to history of PE. Patient agreed. Called and spoke with charge nurse and made aware that Dr. Roselind Messier requested repeat CT chest.  Patient taken to room 23.

## 2022-07-14 NOTE — Progress Notes (Signed)
NIF (best of 3 attempts)= plus - 40

## 2022-07-14 NOTE — Telephone Encounter (Signed)
Pt would like a call from RN to address questions and concerns she has about the IVIG treatment Octagam

## 2022-07-14 NOTE — Telephone Encounter (Signed)
Pt has called to report she has found [id number] to have the medication, she is asking it be called in there

## 2022-07-14 NOTE — Addendum Note (Signed)
Addended by: Eather Colas E on: 07/14/2022 01:03 PM   Modules accepted: Orders

## 2022-07-14 NOTE — ED Triage Notes (Signed)
Pt brought over from [facility] with shortness of breath.

## 2022-07-14 NOTE — Telephone Encounter (Signed)
See other phone note from 04.25.24

## 2022-07-14 NOTE — Telephone Encounter (Signed)
Called and spoke to patient about IVIG and gave her some resources to better understand, she states she will also call and see about getting her mestinon filled at a different pharmacy per other phone note from 04.25.24. She will let us know if she needs the refill sent somewhere else.

## 2022-07-14 NOTE — Telephone Encounter (Signed)
1ST ATTEMPT: Called and heard a constant ringing. Called to notify the pt that she will need to continue calling pharmacies and start with [facility] and major [facility] locations because they can see other pharmacies in a certain mile radius to see if any nearby stores have the medication

## 2022-07-14 NOTE — ED Provider Notes (Signed)
[place] [facility]PARTMENT AT [facility] Provider Note   CSN: [id number] Arrival date & time: 07/14/22  1450     History  Chief Complaint  Patient presents with   Shortness of Breath    Faith Massey is a 77 y.o. female.  77 year old female with a history of PE on Eliquis, breast cancer, thymoma undergoing treatment, and myasthenia gravis who presents emergency department with shortness of breath.  Says that 1 week ago she was diagnosed with pneumonia and was treated with doxycycline which finished on Tuesday.  Says that since then she has had persistent shortness of breath that worsened today.  Also feels fatigued but does not believe she has had any fevers.  Does have a dry cough.  Is currently on prednisone.  Has been compliant with her Eliquis.  Went to an outpatient appointment today and they are concerned about that she may need a CT scan return to the emergency department for additional evaluation.       Home Medications Prior to Admission medications   Medication Sig Start Date End Date Taking? Authorizing Provider  apixaban (ELIQUIS) 5 MG TABS tablet Take 1 tablet (5 mg total) by mouth 2 (two) times daily. 06/28/22 09/26/22  Rushie Chestnut, PA-C  APIXABAN Everlene Balls) VTE STARTER PACK (  AND ) Take as directed on package: start with two-5mg  tablets twice daily for 7 days. On day 8, switch to one-5mg  tablet twice daily. 06/14/22   Loetta Rough, MD  aspirin EC 81 MG tablet Take 1 tablet (81 mg total) by mouth daily. Swallow whole. 03/30/22   Osvaldo Shipper, MD  Cholecalciferol (VITAMIN D-3) 5000 UNITS TABS Take 5,000 Units by mouth daily.    [provider]  COLLAGEN PO Take 1 Scoop by mouth daily.    [provider]  doxycycline (VIBRAMYCIN) 100 MG capsule Take 1 capsule (100 mg total) by mouth 2 (two) times daily. 06/20/22   Copland, Gwenlyn Found, MD  ezetimibe (ZETIA) 10 MG tablet Take 1 tablet (10 mg total) by mouth daily. 03/30/22   Osvaldo Shipper, MD  hydrOXYzine (ATARAX) 10 MG tablet 1- 2 tab qhs prn 05/30/22   Levert Feinstein, MD  lisinopril (ZESTRIL) 40 MG tablet Take 1 tablet (40 mg total) by mouth daily. 06/14/22 03/31/23  Copland, Gwenlyn Found, MD  Multiple Vitamins-Minerals (MULTIVITAMIN WITH MINERALS) tablet Take 1 tablet by mouth daily.    [provider]  nystatin (MYCOSTATIN) 100000 UNIT/ML suspension Take 5 mLs (500,000 Units total) by mouth 4 (four) times daily. 06/06/22   Levert Feinstein, MD  predniSONE (DELTASONE) 5 MG tablet Take 4 tablets (20 mg total) by mouth daily with breakfast. 06/22/22   Levert Feinstein, MD  pyridostigmine (MESTINON) 60 MG tablet Take 1 tablet (60 mg total) by mouth 3 (three) times daily. 07/14/22   Levert Feinstein, MD  traMADol (ULTRAM) 50 MG tablet Take 1 tablet (50 mg total) by mouth every 6 (six) hours as needed (mild pain). 05/20/22   Leary Roca, PA-C      Allergies    Prednisone    Review of Systems   Review of Systems  Physical Exam Updated Vital Signs BP (!) 152/84   Pulse 85   Temp 97.9 F (36.6 C)   Resp 18   Ht  (1.473 m)   Wt 53 kg   SpO2 93%   BMI 24.42 kg/m  Physical Exam Vitals and nursing note reviewed.  Constitutional:      General: She  is not in acute distress.    Appearance: She is well-developed.  HENT:     Head: Normocephalic and atraumatic.     Right Ear: External ear normal.     Left Ear: External ear normal.     Nose: Nose normal.  Eyes:     Extraocular Movements: Extraocular movements intact.     Conjunctiva/sclera: Conjunctivae normal.     Pupils: Pupils are equal, round, and reactive to light.  Cardiovascular:     Rate and Rhythm: Normal rate and regular rhythm.     Heart sounds: No murmur heard. Pulmonary:     Effort: Pulmonary effort is normal. No respiratory distress.     Breath sounds: Normal breath sounds.  Abdominal:     General: Abdomen is flat. There is no distension.     Palpations: Abdomen is soft. There is no mass.     Tenderness:  There is no abdominal tenderness. There is no guarding.  Musculoskeletal:     Cervical back: Normal range of motion and neck supple.     Right lower leg: Edema present.     Left lower leg: Edema present.  Skin:    General: Skin is warm and dry.  Neurological:     Mental Status: She is alert and oriented to person, place, and time. Mental status is at baseline.  Psychiatric:        Mood and Affect: Mood normal.     ED Results / Procedures / Treatments   Labs (all labs ordered are listed, but only abnormal results are displayed) Labs Reviewed  COMPREHENSIVE METABOLIC PANEL - Abnormal; Notable for the following components:      Result Value   Potassium 3.0 (*)    Glucose, Bld 112 (*)    BUN 25 (*)    Calcium 8.5 (*)    Total Protein 5.5 (*)    Albumin 2.9 (*)    All other components within normal limits  CBC - Abnormal; Notable for the following components:   WBC 11.4 (*)    RBC 3.78 (*)    RDW 16.4 (*)    All other components within normal limits  BLOOD GAS, VENOUS - Abnormal; Notable for the following components:   pH, Ven 7.47 (*)    pCO2, Ven 42 (*)    pO2, Ven <31 (*)    Bicarbonate 30.6 (*)    Acid-Base Excess 6.2 (*)    All other components within normal limits  BRAIN NATRIURETIC PEPTIDE - Abnormal; Notable for the following components:   B Natriuretic Peptide 123.7 (*)    All other components within normal limits  I-STAT CHEM 8, ED - Abnormal; Notable for the following components:   Potassium 3.1 (*)    Glucose, Bld 114 (*)    Calcium, Ion 1.06 (*)    Hemoglobin 11.6 (*)    HCT 34.0 (*)    All other components within normal limits  RESP PANEL BY RT-PCR (RSV, FLU A&B, COVID)  RVPGX2  TROPONIN I (HIGH SENSITIVITY)  TROPONIN I (HIGH SENSITIVITY)    EKG EKG Interpretation  Date/Time:  Thursday July 14 2022 17:53:40 EDT Ventricular Rate:  87 PR Interval:  118 QRS Duration: 96 QT Interval:  374 QTC Calculation: 450 R Axis:   12 Text  Interpretation: Sinus rhythm Borderline short PR interval RSR' in V1 or V2, right VCD or RVH Nonspecific ST depression No significant change since last tracing Confirmed by Vonita Moss [phone]) on 07/14/2022 6:26:53 PM  Radiology CT  Angio Chest PE W and/or Wo Contrast  Result Date: 07/14/2022 CLINICAL DATA:  Shortness of breath. History of breast and lung cancer. EXAM: CT ANGIOGRAPHY CHEST WITH CONTRAST TECHNIQUE: Multidetector CT imaging of the chest was performed using the standard protocol during bolus administration of intravenous contrast. Multiplanar CT image reconstructions and MIPs were obtained to evaluate the vascular anatomy. RADIATION DOSE REDUCTION: This exam was performed according to the departmental dose-optimization program which includes automated exposure control, adjustment of the mA and/or kV according to patient size and/or use of iterative reconstruction technique. CONTRAST:  80mL OMNIPAQUE IOHEXOL 350 MG/ML SOLN COMPARISON:  June 14, 2022. FINDINGS: Cardiovascular: Satisfactory opacification of the pulmonary arteries to the segmental level. No evidence of pulmonary embolism. Normal heart size. No pericardial effusion. Mediastinum/Nodes: No enlarged mediastinal, hilar, or axillary lymph nodes. Thyroid gland, trachea, and esophagus demonstrate no significant findings. Lungs/Pleura: No pneumothorax or pleural effusion is noted. Right upper lobe airspace opacity is noted concerning for pneumonia. Minimal left basilar subsegmental atelectasis is noted. Upper Abdomen: No acute abnormality. Musculoskeletal: No chest wall abnormality. No acute or significant osseous findings. Review of the MIP images confirms the above findings. IMPRESSION: No definite evidence of pulmonary embolus. Mild to moderate right upper lobe airspace opacity is noted concerning for pneumonia. Electronically Signed   By: Lupita Raider M.D.   On: 07/14/2022 17:34    Procedures Procedures    Medications Ordered  in ED Medications  potassium chloride SA (KLOR-CON M) CR tablet 40 mEq (40 mEq Oral Given 07/14/22 1758)  iohexol (OMNIPAQUE) 350 MG/ML injection 80 mL (80 mLs Intravenous Contrast Given 07/14/22 1703)    ED Course/ Medical Decision Making/ A&P Clinical Course as of 07/14/22 1830  Thu Jul 14, 2022  1757 Blood gas, venous [SK]  1824 Signed out to Dr. Donnald Garre [RP]    Clinical Course User Index [RP] Rondel Baton, MD [SK] Sherlyn Lick, Student-PA                            Medical Decision Making Amount and/or Complexity of Data Reviewed Labs: ordered. Radiology: ordered.  Risk Prescription drug management.   Faith Massey is a 77 y.o. female with comorbidities that complicate the patient evaluation including  PE on Eliquis, breast cancer, thymoma undergoing treatment, and myasthenia gravis who presents emergency department with shortness of breath.     Initial Ddx:  Pneumonia, PE, pericardial effusion, anemia, heart failure exacerbation, myasthenia gravis and diaphragm fatigue  MDM:  Concern for pneumonia given the patient's history.  Also considering PE but feel this is less likely since she is on Eliquis but with her symptoms feel that CT is likely warranted to evaluate for pneumonia so we will also scan her for pulmonary embolism.  With her history of malignancy can potentially have a pericardial effusion as well does have significant lower extremity swelling so we will also check for pulmonary edema and send BNP to assess for heart failure exacerbation.  Will also perform PFTs and send VBG to assess her diaphragm failure with her history of myasthenia gravis.  Plan:  Labs Troponin BNP VBG COVID and flu CTA Personal capacity and NIF  ED Summary/Re-evaluation:  Patient's labs returned and showed a mild elevation of her white blood cell count at 11.4.  Potassium was also low and was replenished.  Was not retaining CO2 on her VBG and we are awaiting her pulmonary  function test at this time.  COVID  and flu were also negative and CT is pending at this time.  Signed out to the oncoming doctor awaiting final results and disposition.  This patient presents to the ED for concern of complaints listed in HPI, this involves an extensive number of treatment options, and is a complaint that carries with it a high risk of complications and morbidity. Disposition including potential need for admission considered.   Dispo: Pending remainder of workup  Records reviewed Outpatient Clinic Notes The following labs were independently interpreted: Chemistry and show  hypokalemia I personally reviewed and interpreted cardiac monitoring: normal sinus rhythm  I personally reviewed and interpreted the pt's EKG: see above for interpretation  I have reviewed the patients home medications and made adjustments as needed Social Determinants of health:  Eldelry  Final Clinical Impression(s) / ED Diagnoses Final diagnoses:  Shortness of breath  Pneumonia of right upper lobe due to infectious organism  History of cancer  Hypokalemia    Rx / DC Orders ED Discharge Orders     None         Rondel Baton, MD 07/14/22 [phone]

## 2022-07-15 ENCOUNTER — Ambulatory Visit
Admission: RE | Admit: 2022-07-15 | Discharge: 2022-07-15 | Disposition: A | Discharge status: Home / Self Care | Payer: Medicare HMO | Source: Ambulatory Visit | Attending: Radiation Oncology | Admitting: Radiation Oncology

## 2022-07-15 ENCOUNTER — Other Ambulatory Visit: Payer: Self-pay

## 2022-07-15 DIAGNOSIS — C37 Malignant neoplasm of thymus: Secondary | ICD-10-CM | POA: Diagnosis not present

## 2022-07-15 DIAGNOSIS — R0602 Shortness of breath: Secondary | ICD-10-CM | POA: Diagnosis not present

## 2022-07-15 DIAGNOSIS — R059 Cough, unspecified: Secondary | ICD-10-CM | POA: Diagnosis not present

## 2022-07-15 DIAGNOSIS — Z510 Encounter for antineoplastic radiation therapy: Secondary | ICD-10-CM | POA: Diagnosis not present

## 2022-07-15 LAB — RAD ONC ARIA SESSION SUMMARY
Course Elapsed Days: 36
Plan Fractions Treated to Date: 26
Plan Prescribed Dose Per Fraction: 1.8 Gy
Plan Total Fractions Prescribed: 30
Plan Total Prescribed Dose: 54 Gy
Reference Point Dosage Given to Date: 46.8 Gy
Reference Point Session Dosage Given: 1.8 Gy
Session Number: 26

## 2022-07-15 LAB — CULTURE, BLOOD (ROUTINE X 2)
Culture: NO GROWTH
Special Requests: ADEQUATE
Special Requests: ADEQUATE

## 2022-07-17 LAB — CULTURE, BLOOD (ROUTINE X 2)

## 2022-07-18 ENCOUNTER — Other Ambulatory Visit: Payer: Self-pay

## 2022-07-18 ENCOUNTER — Ambulatory Visit
Admission: RE | Admit: 2022-07-18 | Discharge: 2022-07-18 | Disposition: A | Discharge status: Home / Self Care | Payer: Medicare HMO | Source: Ambulatory Visit | Attending: Radiation Oncology | Admitting: Radiation Oncology

## 2022-07-18 DIAGNOSIS — Z510 Encounter for antineoplastic radiation therapy: Secondary | ICD-10-CM | POA: Diagnosis not present

## 2022-07-18 DIAGNOSIS — R059 Cough, unspecified: Secondary | ICD-10-CM | POA: Diagnosis not present

## 2022-07-18 DIAGNOSIS — C37 Malignant neoplasm of thymus: Secondary | ICD-10-CM | POA: Diagnosis not present

## 2022-07-18 DIAGNOSIS — R0602 Shortness of breath: Secondary | ICD-10-CM | POA: Diagnosis not present

## 2022-07-18 LAB — RAD ONC ARIA SESSION SUMMARY
Course Elapsed Days: 39
Plan Fractions Treated to Date: 27
Plan Prescribed Dose Per Fraction: 1.8 Gy
Plan Total Fractions Prescribed: 30
Plan Total Prescribed Dose: 54 Gy
Reference Point Dosage Given to Date: 48.6 Gy
Reference Point Session Dosage Given: 1.8 Gy
Session Number: 27

## 2022-07-19 ENCOUNTER — Other Ambulatory Visit: Payer: Self-pay

## 2022-07-19 ENCOUNTER — Ambulatory Visit
Admission: RE | Admit: 2022-07-19 | Discharge: 2022-07-19 | Disposition: A | Discharge status: Home / Self Care | Payer: Medicare HMO | Source: Ambulatory Visit | Attending: Radiation Oncology | Admitting: Radiation Oncology

## 2022-07-19 DIAGNOSIS — Z510 Encounter for antineoplastic radiation therapy: Secondary | ICD-10-CM | POA: Diagnosis not present

## 2022-07-19 DIAGNOSIS — R059 Cough, unspecified: Secondary | ICD-10-CM | POA: Diagnosis not present

## 2022-07-19 DIAGNOSIS — R0602 Shortness of breath: Secondary | ICD-10-CM | POA: Diagnosis not present

## 2022-07-19 DIAGNOSIS — C37 Malignant neoplasm of thymus: Secondary | ICD-10-CM | POA: Diagnosis not present

## 2022-07-19 LAB — RAD ONC ARIA SESSION SUMMARY
Course Elapsed Days: 40
Plan Fractions Treated to Date: 28
Plan Prescribed Dose Per Fraction: 1.8 Gy
Plan Total Fractions Prescribed: 30
Plan Total Prescribed Dose: 54 Gy
Reference Point Dosage Given to Date: 50.4 Gy
Reference Point Session Dosage Given: 1.8 Gy
Session Number: 28

## 2022-07-19 LAB — CULTURE, BLOOD (ROUTINE X 2): Culture: NO GROWTH

## 2022-07-19 NOTE — Progress Notes (Unsigned)
[facility] [address] [place], [place] [address] [phone]  Date:  07/21/2022   Name:  Faith Massey   DOB:  12-10-1945   MRN:  [id number]  PCP:  Pearline Cables, MD    Chief Complaint: No chief complaint on file.   History of Present Illness:  Faith Massey is a 77 y.o. very pleasant female patient who presents with the following:  Pt seen today for follow-up from the ER - she was seen on 4/25 with concern of SOB:  We met earlier this year- I saw her most recently 06/20/22- brief background: History of hypertension, CVA, thymoma, myasthenia gravis, remote breast cancer 1995, hyperlipidemia, osteoporosis, pre-diabetes She did have GDM years ago but otherwise prediabetes is a new incidental finding It appears that she was doing just fine until January of this year at which time she felt very tired in addition to right-sided facial droop and difficulty with word finding.  An MRI of the brain showed a subacute stroke, she is now seeing neurology. However pt remarks that some of these findings may have actually been myasthenia gravis instead of a stroke She was also diagnosed with myasthenia gravis by neurology; this was dx in January of this year  During her stroke workup a mediastinal mass was found-Dr. Dorris Fetch did a resection on February 5 which revealed a thymoma She is on mestinon for her MG and also still on her steroids for now - They hope to get her in IVIG treatment soon   ER note 07/14/22 76 year old female with a history of PE on Eliquis, breast cancer, thymoma undergoing treatment, and myasthenia gravis who presents emergency department with shortness of breath. Says that 1 week ago she was diagnosed with pneumonia and was treated with doxycycline which finished on Tuesday. Says that since then she has had persistent shortness of breath that worsened today. Also feels fatigued but does not believe she has had any  fevers. Does have a dry cough. Is currently on prednisone. Has been compliant with her Eliquis. Went to an outpatient appointment today and they are concerned about that she may need a CT scan return to the emergency department for additional evaluation. ////////// CT PE study returned negative for PE with an infiltrate on the right.  I have personally reviewed this study and agree with right-sided pulmonary infiltrate toward the upper lobe.   Patient reports that she had been on doxycycline which she finished 3 days ago.  She reports she has had a slight cough still no fevers or bodyaches.  She has intermittently felt short of breath that seems to be waxing and waning.  She is currently undergoing radiation therapy and has a treatment tomorrow.   We discussed risks and benefits of hospitalization on IV antibiotics and observation overnight.  Patient's preference is to go home.  She is clinically well in appearance with clear mental status and no respiratory distress at rest.  Oxygen saturation on room air are ranging from 90 to 93%.  At this point, I have added a procalcitonin and blood cultures.  I will have the patient get an IV dose of Rocephin and plan to continue her on doxycycline and Augmentin.  Will be seen tomorrow at radiation therapy for recheck.  Careful return precautions reviewed.  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   S/P Robotic Assisted Right  Video Thoracoscopy with resection of Thymus 04/25/2022   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   Myasthenic syndrome (HCC) 04/12/2022   CVA (cerebral vascular accident) (HCC) 03/28/2022   HTN (hypertension) 03/28/2022   Chest mass 03/28/2022   Right knee injury 02/14/2017   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 10/16/2012   Osteoporosis 10/12/2012    Past Medical History:  Diagnosis Date   Breast CA (HCC)    Hyperlipidemia    Hypertension    Myasthenia  gravis (HCC)    Dx'd 03/2022   Osteoporosis 10/12/2012    Past Surgical History:  Procedure Laterality Date   ABDOMINAL HYSTERECTOMY  03/22/2003   BACK SURGERY  03/21/1998   BREAST SURGERY     MASTECTOMY Bilateral 03/21/1993    Social History   Tobacco Use   Smoking status: Never   Smokeless tobacco: Never  Vaping Use   Vaping Use: Never used  Substance Use Topics   Alcohol use: Yes    Alcohol/week: 1.0 standard drink of alcohol    Types: 1 Glasses of wine per week    Comment: occasional   Drug use: No    Family History  Problem Relation Age of Onset   Stroke Mother    Hypertension Mother    Myasthenia gravis Mother    Hypertension Father     Allergies  Allergen Reactions   Prednisone Dermatitis, Hypertension, Palpitations, Shortness Of Breath and Swelling    Patient reports having an intolerance to medication. Reports medication caused swelling and metallic taste in mouth.    Medication list has been reviewed and updated.  Current Outpatient Medications on File Prior to Visit  Medication Sig Dispense Refill   amoxicillin-clavulanate (AUGMENTIN) 875-125 MG tablet Take 1 tablet by mouth every 12 (twelve) hours. 14 tablet 0   apixaban (ELIQUIS) 5 MG TABS tablet Take 1 tablet (5 mg total) by mouth 2 (two) times daily. 60 tablet 2   APIXABAN (ELIQUIS) VTE STARTER PACK (10MG  AND 5MG ) Take as directed on package: start with two-5mg  tablets twice daily for 7 days. On day 8, switch to one-5mg  tablet twice daily. 1 each 0   aspirin EC 81 MG tablet Take 1 tablet (81 mg total) by mouth daily. Swallow whole. 30 tablet 12   Cholecalciferol (VITAMIN D-3) 5000 UNITS TABS Take 5,000 Units by mouth daily.     COLLAGEN PO Take 1 Scoop by mouth daily.     doxycycline (VIBRAMYCIN) 100 MG capsule Take 1 capsule (100 mg total) by mouth 2 (two) times daily. 20 capsule 0   doxycycline (VIBRAMYCIN) 100 MG capsule Take 1 capsule (100 mg total) by mouth 2 (two) times daily. 14 capsule 0    ezetimibe (ZETIA) 10 MG tablet Take 1 tablet (10 mg total) by mouth daily. 30 tablet 3   hydrOXYzine (ATARAX) 10 MG tablet 1- 2 tab qhs prn 60 tablet 3   lisinopril (ZESTRIL) 40 MG tablet Take 1 tablet (40 mg total) by mouth daily. 90 tablet 3   Multiple Vitamins-Minerals (MULTIVITAMIN WITH MINERALS) tablet Take 1 tablet by mouth daily.     nystatin (MYCOSTATIN) 100000 UNIT/ML suspension Take 5 mLs (500,000 Units total) by mouth 4 (four) times daily. 500 mL 1   predniSONE (DELTASONE) 5 MG tablet Take 4 tablets (20 mg total) by mouth daily with breakfast. 120 tablet 6   pyridostigmine (MESTINON) 60 MG tablet Take 1 tablet (60 mg total) by mouth 3 (three) times daily. 90 tablet 6   traMADol (ULTRAM) 50 MG tablet  Take 1 tablet (50 mg total) by mouth every 6 (six) hours as needed (mild pain). 28 tablet 0   No current facility-administered medications on file prior to visit.    Review of Systems:  As per HPI- otherwise negative.   Physical Examination: There were no vitals filed for this visit. There were no vitals filed for this visit. There is no height or weight on file to calculate BMI. Ideal Body Weight:    GEN: no acute distress. HEENT: Atraumatic, Normocephalic.  Ears and Nose: No external deformity. CV: RRR, No M/G/R. No JVD. No thrill. No extra heart sounds. PULM: CTA B, no wheezes, crackles, rhonchi. No retractions. No resp. distress. No accessory muscle use. ABD: S, NT, ND, +BS. No rebound. No HSM. EXTR: No c/c/e PSYCH: Normally interactive. Conversant.    Assessment and Plan: ***  Signed Abbe Amsterdam, MD

## 2022-07-20 ENCOUNTER — Ambulatory Visit
Admission: RE | Admit: 2022-07-20 | Discharge: 2022-07-20 | Disposition: A | Discharge status: Home / Self Care | Payer: Medicare HMO | Source: Ambulatory Visit | Attending: Radiation Oncology | Admitting: Radiation Oncology

## 2022-07-20 ENCOUNTER — Other Ambulatory Visit: Payer: Self-pay

## 2022-07-20 DIAGNOSIS — Z510 Encounter for antineoplastic radiation therapy: Secondary | ICD-10-CM | POA: Diagnosis not present

## 2022-07-20 DIAGNOSIS — C37 Malignant neoplasm of thymus: Secondary | ICD-10-CM | POA: Insufficient documentation

## 2022-07-20 DIAGNOSIS — R0602 Shortness of breath: Secondary | ICD-10-CM | POA: Diagnosis not present

## 2022-07-20 DIAGNOSIS — R059 Cough, unspecified: Secondary | ICD-10-CM | POA: Insufficient documentation

## 2022-07-20 LAB — RAD ONC ARIA SESSION SUMMARY
Course Elapsed Days: 41
Plan Fractions Treated to Date: 29
Plan Prescribed Dose Per Fraction: 1.8 Gy
Plan Total Fractions Prescribed: 30
Plan Total Prescribed Dose: 54 Gy
Reference Point Dosage Given to Date: 52.2 Gy
Reference Point Session Dosage Given: 1.8 Gy
Session Number: 29

## 2022-07-21 ENCOUNTER — Other Ambulatory Visit: Payer: Self-pay

## 2022-07-21 ENCOUNTER — Other Ambulatory Visit: Payer: Medicare HMO

## 2022-07-21 ENCOUNTER — Ambulatory Visit (INDEPENDENT_AMBULATORY_CARE_PROVIDER_SITE_OTHER): Payer: Medicare HMO | Admitting: Family Medicine

## 2022-07-21 ENCOUNTER — Ambulatory Visit: Payer: Medicare HMO | Admitting: Hematology & Oncology

## 2022-07-21 ENCOUNTER — Ambulatory Visit
Admission: RE | Admit: 2022-07-21 | Discharge: 2022-07-21 | Disposition: A | Discharge status: Home / Self Care | Payer: Medicare HMO | Source: Ambulatory Visit | Attending: Radiation Oncology | Admitting: Radiation Oncology

## 2022-07-21 VITALS — BP 142/84 | HR 99 | Temp 98.1°F | Resp 10 | Ht <= 58 in | Wt 114.6 lb

## 2022-07-21 DIAGNOSIS — G7000 Myasthenia gravis without (acute) exacerbation: Secondary | ICD-10-CM | POA: Diagnosis not present

## 2022-07-21 DIAGNOSIS — I10 Essential (primary) hypertension: Secondary | ICD-10-CM

## 2022-07-21 DIAGNOSIS — R059 Cough, unspecified: Secondary | ICD-10-CM | POA: Diagnosis not present

## 2022-07-21 DIAGNOSIS — J189 Pneumonia, unspecified organism: Secondary | ICD-10-CM

## 2022-07-21 DIAGNOSIS — Z510 Encounter for antineoplastic radiation therapy: Secondary | ICD-10-CM | POA: Diagnosis not present

## 2022-07-21 DIAGNOSIS — C37 Malignant neoplasm of thymus: Secondary | ICD-10-CM | POA: Diagnosis not present

## 2022-07-21 DIAGNOSIS — R0602 Shortness of breath: Secondary | ICD-10-CM | POA: Diagnosis not present

## 2022-07-21 LAB — RAD ONC ARIA SESSION SUMMARY
Course Elapsed Days: 42
Plan Fractions Treated to Date: 30
Plan Prescribed Dose Per Fraction: 1.8 Gy
Plan Total Fractions Prescribed: 30
Plan Total Prescribed Dose: 54 Gy
Reference Point Dosage Given to Date: 54 Gy
Reference Point Session Dosage Given: 1.8 Gy
Session Number: 30

## 2022-07-21 NOTE — Patient Instructions (Signed)
It was good to see you today- I am glad you are doing better!  Plan to recheck chest x-ray in 2-3 weeks Let me know if you need anything else in the meantime I will ask Dr Myna Hidalgo to check your potasium for you

## 2022-07-22 ENCOUNTER — Encounter: Payer: Self-pay | Admitting: *Deleted

## 2022-07-22 ENCOUNTER — Inpatient Hospital Stay: Payer: Medicare HMO | Attending: Hematology & Oncology

## 2022-07-22 ENCOUNTER — Inpatient Hospital Stay: Payer: Medicare HMO | Admitting: Hematology & Oncology

## 2022-07-22 ENCOUNTER — Encounter: Payer: Self-pay | Admitting: Hematology & Oncology

## 2022-07-22 VITALS — BP 145/77 | HR 96 | Temp 97.8°F | Resp 24 | Ht <= 58 in | Wt 114.8 lb

## 2022-07-22 DIAGNOSIS — C37 Malignant neoplasm of thymus: Secondary | ICD-10-CM

## 2022-07-22 DIAGNOSIS — G7000 Myasthenia gravis without (acute) exacerbation: Secondary | ICD-10-CM | POA: Diagnosis not present

## 2022-07-22 DIAGNOSIS — Z7901 Long term (current) use of anticoagulants: Secondary | ICD-10-CM | POA: Insufficient documentation

## 2022-07-22 DIAGNOSIS — I2699 Other pulmonary embolism without acute cor pulmonale: Secondary | ICD-10-CM | POA: Diagnosis not present

## 2022-07-22 DIAGNOSIS — Z86000 Personal history of in-situ neoplasm of breast: Secondary | ICD-10-CM | POA: Diagnosis not present

## 2022-07-22 DIAGNOSIS — Z9013 Acquired absence of bilateral breasts and nipples: Secondary | ICD-10-CM | POA: Diagnosis not present

## 2022-07-22 LAB — CBC WITH DIFFERENTIAL (CANCER CENTER ONLY)
Abs Immature Granulocytes: 0.25 10*3/uL — ABNORMAL HIGH (ref 0.00–0.07)
Basophils Absolute: 0 10*3/uL (ref 0.0–0.1)
Basophils Relative: 0 %
Eosinophils Absolute: 0 10*3/uL (ref 0.0–0.5)
Eosinophils Relative: 0 %
HCT: 36.6 % (ref 36.0–46.0)
Hemoglobin: 12.1 g/dL (ref 12.0–15.0)
Immature Granulocytes: 2 %
Lymphocytes Relative: 3 %
Lymphs Abs: 0.4 10*3/uL — ABNORMAL LOW (ref 0.7–4.0)
MCH: 32.4 pg (ref 26.0–34.0)
MCHC: 33.1 g/dL (ref 30.0–36.0)
MCV: 98.1 fL (ref 80.0–100.0)
Monocytes Absolute: 0.8 10*3/uL (ref 0.1–1.0)
Monocytes Relative: 6 %
Neutro Abs: 11.8 10*3/uL — ABNORMAL HIGH (ref 1.7–7.7)
Neutrophils Relative %: 89 %
Platelet Count: 254 10*3/uL (ref 150–400)
RBC: 3.73 MIL/uL — ABNORMAL LOW (ref 3.87–5.11)
RDW: 16.6 % — ABNORMAL HIGH (ref 11.5–15.5)
WBC Count: 13.4 10*3/uL — ABNORMAL HIGH (ref 4.0–10.5)
nRBC: 0 % (ref 0.0–0.2)

## 2022-07-22 LAB — CMP (CANCER CENTER ONLY)
ALT: 25 U/L (ref 0–44)
AST: 17 U/L (ref 15–41)
Albumin: 3.2 g/dL — ABNORMAL LOW (ref 3.5–5.0)
Alkaline Phosphatase: 64 U/L (ref 38–126)
Anion gap: 10 (ref 5–15)
BUN: 26 mg/dL — ABNORMAL HIGH (ref 8–23)
CO2: 32 mmol/L (ref 22–32)
Calcium: 9.1 mg/dL (ref 8.9–10.3)
Chloride: 106 mmol/L (ref 98–111)
Creatinine: 0.86 mg/dL (ref 0.44–1.00)
GFR, Estimated: >60 mL/min (ref >60)
Glucose, Bld: 130 mg/dL — ABNORMAL HIGH (ref 70–99)
Potassium: 3.7 mmol/L (ref 3.5–5.1)
Sodium: 148 mmol/L — ABNORMAL HIGH (ref 135–145)
Total Bilirubin: 0.6 mg/dL (ref 0.3–1.2)
Total Protein: 5.8 g/dL — ABNORMAL LOW (ref 6.5–8.1)

## 2022-07-22 LAB — LACTATE DEHYDROGENASE: LDH: 279 U/L — ABNORMAL HIGH (ref 98–192)

## 2022-07-22 NOTE — Progress Notes (Signed)
Hematology and Oncology Follow Up Visit  Faith Massey [id number] April 17, 1945 77 y.o. 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  She now is in with a pulmonary embolism.  She had is back on 06/14/2022.  She does have myasthenia gravis.  She had her thymoma resected.  She underwent adjuvant radiation therapy for this.  Again, she was found to have a pulmonary embolus.  She had a follow-up CT angiogram that was done a week or so ago.  This did not show any residual pulmonary embolus.  Of note, she had Doppler of her legs back in January, this did not show any evidence of thromboembolic disease.  She is feeling okay.  She does not have any problems with nausea or vomiting.  She has had no issues with cough or shortness of breath.  There is no chest wall pain.  Currently, I would have to say that her performance status is probably ECOG 1.     Wt Readings from Last 3 Encounters:  07/22/22 114 lb 12.8 oz (52.1 kg)  07/21/22 114 lb 9.6 oz (52 kg)  07/14/22 116 lb 13.5 oz (53 kg)     Medications:   Current Outpatient Medications:    amoxicillin-clavulanate (AUGMENTIN) 875-125 MG tablet, Take 1 tablet by mouth every 12 (twelve) hours., Disp: 14 tablet, Rfl: 0   apixaban (ELIQUIS) 5 MG TABS tablet, Take 1 tablet (5 mg total) by mouth 2 (two) times daily., Disp: 60 tablet, Rfl: 2   aspirin EC 81 MG tablet, Take 1 tablet (81 mg total) by mouth daily. Swallow whole., Disp: 30 tablet, Rfl: 12   Cholecalciferol (VITAMIN D-3) 5000 UNITS TABS, Take 5,000 Units by mouth daily., Disp: , Rfl:     COLLAGEN PO, Take 1 Scoop by mouth daily., Disp: , Rfl:    doxycycline (VIBRAMYCIN) 100 MG capsule, Take 1 capsule (100 mg total) by mouth 2 (two) times daily., Disp: 14 capsule, Rfl: 0   hydrOXYzine (ATARAX) 10 MG tablet, 1- 2 tab qhs prn, Disp: 60 tablet, Rfl: 3   lisinopril (ZESTRIL) 40 MG tablet, Take 1 tablet (40 mg total) by mouth daily., Disp: 90 tablet, Rfl: 3   loperamide (IMODIUM A-D) 2 MG tablet, Take 2 mg by mouth 4 (four) times daily as needed for diarrhea or loose stools., Disp: , Rfl:    Multiple Vitamins-Minerals (MULTIVITAMIN WITH MINERALS) tablet, Take 1 tablet by mouth daily., Disp: , Rfl:    nystatin (MYCOSTATIN) 100000 UNIT/ML suspension, Take 5 mLs (500,000 Units total) by mouth 4 (four) times daily., Disp: 500 mL, Rfl: 1   predniSONE (DELTASONE) 5 MG tablet, Take 4 tablets (20 mg total) by mouth daily with breakfast., Disp: 120 tablet, Rfl: 6   pyridostigmine (MESTINON) 60 MG tablet, Take 1 tablet (60 mg total) by mouth 3 (three) times daily., Disp: 90 tablet, Rfl: 6   ezetimibe (ZETIA) 10 MG tablet, Take 1 tablet (10 mg total) by mouth daily. (Patient not taking: Reported on 07/22/2022), Disp: 30 tablet, Rfl: 3   traMADol (ULTRAM) 50 MG tablet, Take 1 tablet (  50 mg total) by mouth every 6 (six) hours as needed (mild pain). (Patient not taking: Reported on 07/22/2022), Disp: 28 tablet, Rfl: 0  Allergies:  Allergies  Allergen Reactions   Prednisone Dermatitis, Hypertension, Palpitations, Shortness Of Breath and Swelling    Patient reports having an intolerance to medication. Reports medication caused swelling and metallic taste in mouth.    Past Medical History, Surgical history, Social history, and Family History were reviewed and updated.  Review of Systems: Review of Systems  Constitutional: Negative.   HENT:  Negative.    Eyes: Negative.   Respiratory: Negative.    Cardiovascular: Negative.   Gastrointestinal: Negative.   Endocrine: Negative.   Genitourinary:  Negative.    Musculoskeletal: Negative.   Skin: Negative.   Neurological: Negative.   Hematological: Negative.   Psychiatric/Behavioral: Negative.      As stated above in HPI  Physical Exam:  height is 4\' 10"  (1.473 m) and weight is 114 lb 12.8 oz (52.1 kg). Her oral temperature is 97.8 F (36.6 C). Her blood pressure is 145/77 (abnormal) and her pulse is 96. Her respiration is 24 (abnormal) and oxygen saturation is 96%.   Physical Exam Vitals reviewed.  HENT:     Head: Normocephalic and atraumatic.  Eyes:     Pupils: Pupils are equal, round, and reactive to light.  Cardiovascular:     Rate and Rhythm: Normal rate and regular rhythm.     Heart sounds: Normal heart sounds.  Pulmonary:     Effort: Pulmonary effort is normal.     Breath sounds: Normal breath sounds.  Abdominal:     General: Bowel sounds are normal.     Palpations: Abdomen is soft.  Musculoskeletal:        General: No tenderness or deformity. Normal range of motion.     Cervical back: Normal range of motion.  Lymphadenopathy:     Cervical: No cervical adenopathy.  Skin:    General: Skin is warm and dry.     Findings: No erythema or rash.  Neurological:     Mental Status: She is alert and oriented to person, place, and time.  Psychiatric:        Behavior: Behavior normal.        Thought Content: Thought content normal.        Judgment: Judgment normal.     Lab Results  Component Value Date   WBC 13.4 (H) 07/22/2022   HGB 12.1 07/22/2022   HCT 36.6 07/22/2022   MCV 98.1 07/22/2022   PLT 254 07/22/2022     Chemistry      Component Value Date/Time   NA 148 (H) 07/22/2022 1258   NA 143 05/11/2022 1514   K 3.7 07/22/2022 1258   CL 106 07/22/2022 1258   CO2 32 07/22/2022 1258   BUN 26 (H) 07/22/2022 1258   BUN 15 05/11/2022 1514   CREATININE 0.86 07/22/2022 1258   CREATININE 0.61 05/06/2013 1003      Component Value Date/Time   CALCIUM 9.1 07/22/2022 1258   ALKPHOS 64 07/22/2022 1258   AST  17 07/22/2022 1258   ALT 25 07/22/2022 1258   BILITOT 0.6 07/22/2022 1258      Assessment and Plan-   Faith Massey is a very charming 77 year old white female.  She is followed up for her thymoma and for the pulmonary embolism.  Everything looks fantastic from my point of view.  I would keep her on the Eliquis for right now.  I  probably keep her on Eliquis for good 6 months and then have her on maintenance Eliquis for possibly 6 months.  I just do not think that we have to do any hypercoagulable studies on her.  I think this pulmonary embolism was probably reflective of her surgery.  We will plan for another follow-up CT of the chest in about 3 to 4 months.  I think this will be the best way for [place] to evaluate her for any kind of recurrence of the thymoma.  It is always fun talking to her.  She is quite delightful.  She is very eloquent.

## 2022-07-22 NOTE — Progress Notes (Signed)
Patient has completed her treatment for Thymoma and will now proceed with observation. Will discontinue active navigation at this time but be available to the patient as needed.   Oncology Nurse Navigator Documentation     07/22/2022    1:30 PM  Oncology Nurse Navigator Flowsheets  Phase of Treatment Radiation  Radiation Actual End Date: 07/20/2022  Navigation Complete Date: 07/22/2022  Post Navigation: Continue to Follow Patient? No  Reason Not Navigating Patient: No Treatment, Observation Only  Navigator Location [facility]  Navigator Encounter Type Appt/Treatment Plan Review  Patient Visit Type MedOnc  Treatment Phase Active Tx  Barriers/Navigation Needs No Barriers At This Time  Interventions None Required  Acuity Level 1-No Barriers  Support Groups/Services Friends and Family  Time Spent with Patient 15

## 2022-07-26 ENCOUNTER — Telehealth: Payer: Self-pay | Admitting: Neurology

## 2022-07-26 NOTE — Radiation Completion Notes (Signed)
Patient Name: Faith Massey, Faith Massey MRN: [id number] Date of Birth: June 18, 1945 Referring Physician: Charlett Lango, M.D. Date of Service: 2022-07-26 Radiation Oncologist: Arnette Schaumann, M.D. [facility] [place]                             RADIATION ONCOLOGY END OF TREATMENT NOTE     Diagnosis: C37 Malignant neoplasm of thymus Staging on 2022-05-18: Type B2 thymoma (HCC) T=cT1a, N=cN0, M=cM0 Intent: Curative     ==========DELIVERED PLANS==========  First Treatment Date: 2022-06-09 - Last Treatment Date: 2022-07-21   Plan Name: Chest Site: Mediastinum Technique: IMRT Mode: Photon Dose Per Fraction: 1.8 Gy Prescribed Dose (Delivered / Prescribed): 54 Gy / 54 Gy Prescribed Fxs (Delivered / Prescribed): 30 / 30     ==========ON TREATMENT VISIT DATES========== 2022-06-14, 2022-06-21, 2022-06-28, 2022-07-05, 2022-07-07, 2022-07-12, 2022-07-14, 2022-07-19     ==========UPCOMING VISITS==========       ==========APPENDIX - ON TREATMENT VISIT NOTES==========   See weekly On Treatment Notes is Epic for details.

## 2022-07-26 NOTE — Telephone Encounter (Signed)
Pt stated that she is decreasing dosage of predniSONE (DELTASONE) 5 MG tablet [[id number]] and would like to know if it is okay to stop it completely once she is finished with the medication. Please give pt a call to discuss. Thank you!

## 2022-07-27 MED ORDER — PREDNISONE 5 MG PO TABS: 20.0000 mg | ORAL_TABLET | Freq: Every day | ORAL | 6 refills | Status: DC

## 2022-07-27 NOTE — Addendum Note (Signed)
Addended by: Levert Feinstein on: 07/27/2022 03:41 PM   Modules accepted: Orders

## 2022-07-27 NOTE — Telephone Encounter (Signed)
I failed to reach patient, please call her again,   Whether she is approved by home health IVIG treatment If she has any muscle weakness, if she noticed muscle weakness, she should stay on prednisone 5 mg daily, 3.  If she has a lot of questions okay to put on my schedule for May 16

## 2022-07-27 NOTE — Telephone Encounter (Signed)
Pt called back. Stated she have one question for Dr. Terrace Arabia. Pt is asking if Dr. Terrace Arabia can please call her today sometimes.

## 2022-07-27 NOTE — Telephone Encounter (Signed)
I was able to talk with patient, she just finished radiation therapy for her thymoma treatment  Recently recovered from pneumonia  Just started prednisone 10 mg daily this week, she cannot tolerate lower dose of prednisone, still noticed muscle weakness, mainly proximal upper extremity muscles, generalized weakness, occasionally swallowing chewing difficulty  On schedule for home IVIG for next week  I advised her to keep current dose of prednisone 10 mg daily, 1 to 2 weeks after IVIG, if she is seeing improvement, may drop down to 5 mg daily, and stay on 5 mg until follow-up visit June 2024

## 2022-08-02 DIAGNOSIS — G709 Myoneural disorder, unspecified: Secondary | ICD-10-CM | POA: Diagnosis not present

## 2022-08-02 DIAGNOSIS — G7001 Myasthenia gravis with (acute) exacerbation: Secondary | ICD-10-CM | POA: Diagnosis not present

## 2022-08-03 DIAGNOSIS — G7001 Myasthenia gravis with (acute) exacerbation: Secondary | ICD-10-CM | POA: Diagnosis not present

## 2022-08-03 DIAGNOSIS — G709 Myoneural disorder, unspecified: Secondary | ICD-10-CM | POA: Diagnosis not present

## 2022-08-04 DIAGNOSIS — G709 Myoneural disorder, unspecified: Secondary | ICD-10-CM | POA: Diagnosis not present

## 2022-08-04 DIAGNOSIS — G7001 Myasthenia gravis with (acute) exacerbation: Secondary | ICD-10-CM | POA: Diagnosis not present

## 2022-08-05 DIAGNOSIS — G709 Myoneural disorder, unspecified: Secondary | ICD-10-CM | POA: Diagnosis not present

## 2022-08-05 DIAGNOSIS — G7001 Myasthenia gravis with (acute) exacerbation: Secondary | ICD-10-CM | POA: Diagnosis not present

## 2022-08-09 ENCOUNTER — Ambulatory Visit (HOSPITAL_BASED_OUTPATIENT_CLINIC_OR_DEPARTMENT_OTHER)
Admission: RE | Admit: 2022-08-09 | Discharge: 2022-08-09 | Disposition: A | Discharge status: Home / Self Care | Payer: Medicare HMO | Source: Ambulatory Visit | Attending: Family Medicine | Admitting: Family Medicine

## 2022-08-09 ENCOUNTER — Other Ambulatory Visit: Payer: Self-pay | Admitting: Family Medicine

## 2022-08-09 ENCOUNTER — Encounter: Payer: Self-pay | Admitting: Family Medicine

## 2022-08-09 DIAGNOSIS — J189 Pneumonia, unspecified organism: Secondary | ICD-10-CM

## 2022-08-12 ENCOUNTER — Telehealth: Payer: Self-pay | Admitting: Neurology

## 2022-08-12 NOTE — Telephone Encounter (Signed)
Pt stated medication hydrOXYzine (ATARAX) 10 MG tablet isn't covered by insurance. She is asking if something else can be prescribed that insurance will cover.

## 2022-08-14 ENCOUNTER — Other Ambulatory Visit (HOSPITAL_COMMUNITY): Payer: Self-pay

## 2022-08-14 ENCOUNTER — Telehealth: Payer: Self-pay

## 2022-08-14 NOTE — Telephone Encounter (Signed)
Pharmacy Patient Advocate Encounter   Received notification from [facility] that prior authorization for hydrOXYzine HCl 10MG  tablets is required/requested.   PA submitted on 08/14/2022 to (ins) [facility] via CoverMyMeds [employer] or [facility]) confirmation # [id number]  Status is pending

## 2022-08-15 NOTE — Telephone Encounter (Signed)
Pharmacy Patient Advocate Encounter  Received notification from [employer] that the request for prior authorization for hydrOXYzine HCl 10MG  tablets has been denied due to see below.     Please be advised we currently do not have a Pharmacist to review denials, therefore you will need to process appeals accordingly as needed. Thanks for your support at this time.   You may call [phone] or fax [phone], to appeal.  The denial letter has been scanned into the chart.

## 2022-08-16 NOTE — Telephone Encounter (Signed)
Call to patient, she states she wishes to stop taking the 5mg  prednisone due to feeling like it is causing her more problems than helping at this point. She is having trouble with sleep, cellulitis, oral thrush, and swelling in ankles and feet. Her cancer doctor agrees that her weakness can be a result of long term use per patient report. I reviewed Dr. Terrace Arabia recommendation on staying on 5mg  prednisone until her appointment in June but she would like to taper off now and would like taper recommendations. Advised I would let covering MD aware for advise and recommendations

## 2022-08-16 NOTE — Telephone Encounter (Signed)
Pt called needing to speak to someone regarding this denial and her other medications. Pt would like to know if she can get off of the Prednisone. Please advise.

## 2022-08-16 NOTE — Telephone Encounter (Signed)
Call to patient to inform of Dr. Marjory Lies advise on stopping prednisone and continuing IVIG. Patient verbalized understanding. Patient aware Florentina Addison RN has reached out to Dr. Marjory Lies and awaiting response on hydroxyzine and will follow up with answer/advise.

## 2022-08-17 NOTE — Addendum Note (Signed)
Addended by: Joycelyn Schmid R on: 08/17/2022 05:35 PM   Modules accepted: Orders

## 2022-08-17 NOTE — Telephone Encounter (Signed)
Give prednisone time to wear off. May use OTC melatonin. Use cool temperature and eye mask. Follow sleep hygiene. -VRP

## 2022-08-19 NOTE — Progress Notes (Signed)
Radiation Oncology         [phone] ________________________________  Name: Faith Massey MRN: [id number]  Date: 08/22/2022  DOB: 08-Sep-1945  Follow-Up Visit Note  CC: Copland, Gwenlyn Found, MD  Loreli Slot, *  No diagnosis found.  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 Presented with stroke like symptoms in January 2024. Work-up incdentally revealed an anterior mediastinal mass and myasthenia gravis.      Interval Since Last Radiation: 1 month and 1 day  Indication for treatment: Curative       Radiation treatment dates: 06/09/22 through 07/21/22  Site/dose: Mediastinum - 54 Gy delivered in 30 Fx at 1.8 Gy/Fx Beams/energy: 6X Technique/Mode: IMRT, Photon  Narrative:  The patient returns today for routine follow-up. The patient tolerated radiation treatment relatively well.  During her final weekly treatment check on 07/19/22, the patient endorsed middle to lower back pain, severe fatigue, dry cough, shortness of breath, and mild pain with swallowing.   She was taken to the ED on 07/14/22 which was about 1 week before her final treatment after cone beam imaging showed progression of her pneumonia. CTA of the chest performed int the ED showed no evidence of PE but she was recommended admission. She declined admission and deferred to outpatient treatment where she was started on Doxycycline and Augmentin. She developed diarrhea with the antibiotic regimen and started a probiotic. She was also approved for IVIG which was tentatively scheduled to start the week after she completed treatment.        She also presented to the ED on 06/14/22 (shortly after starting treatment) and had a CTA of the chest performed for evaluation of chest pain which showed an isolated subsegmental filling defect to the left upper lobe consistent with a small volume pulmonary embolus of  indeterminate clinical significance. No other abnormalities were appreciated in the chest or visualized bony structures. She was subsequently started on eliquis which she continues to tolerate well. It seems as though CTA results were pending at the time of discharge and her symptoms resolved on their own while in the ED.                       Since completing radiation, the patient followed up with Dr. Myna Hidalgo on 07/22/22. For now, Dr. Myna Hidalgo would like to keep her on Eliquis for at least 6 months and then transition her to maintenance Eliquis for another 6 months.   ***   Allergies:  is allergic to prednisone.  Meds: Current Outpatient Medications  Medication Sig Dispense Refill   amoxicillin-clavulanate (AUGMENTIN) 875-125 MG tablet Take 1 tablet by mouth every 12 (twelve) hours. 14 tablet 0   apixaban (ELIQUIS) 5 MG TABS tablet Take 1 tablet (5 mg total) by mouth 2 (two) times daily. 60 tablet 2   aspirin EC 81 MG tablet Take 1 tablet (81 mg total) by mouth daily. Swallow whole. 30 tablet 12   Cholecalciferol (VITAMIN D-3) 5000 UNITS TABS Take 5,000 Units by mouth daily.     COLLAGEN PO Take 1 Scoop by mouth daily.     doxycycline (VIBRAMYCIN) 100 MG capsule Take 1 capsule (100 mg total) by mouth 2 (two) times daily. 14 capsule 0   ezetimibe (ZETIA) 10 MG tablet Take 1 tablet (10 mg total) by mouth daily. (Patient not taking: Reported on 07/22/2022) 30 tablet 3  lisinopril (ZESTRIL) 40 MG tablet Take 1 tablet (40 mg total) by mouth daily. 90 tablet 3   loperamide (IMODIUM A-D) 2 MG tablet Take 2 mg by mouth 4 (four) times daily as needed for diarrhea or loose stools.     Multiple Vitamins-Minerals (MULTIVITAMIN WITH MINERALS) tablet Take 1 tablet by mouth daily.     nystatin (MYCOSTATIN) 100000 UNIT/ML suspension Take 5 mLs (500,000 Units total) by mouth 4 (four) times daily. 500 mL 1   pyridostigmine (MESTINON) 60 MG tablet Take 1 tablet (60 mg total) by mouth 3 (three) times daily. 90  tablet 6   No current facility-administered medications for this encounter.    Physical Findings: The patient is in no acute distress. Patient is alert and oriented.  vitals were not taken for this visit. .  No significant changes. Lungs are clear to auscultation bilaterally. Heart has regular rate and rhythm. No palpable cervical, supraclavicular, or axillary adenopathy. Abdomen soft, non-tender, normal bowel sounds.   Lab Findings: Lab Results  Component Value Date   WBC 13.4 (H) 07/22/2022   HGB 12.1 07/22/2022   HCT 36.6 07/22/2022   MCV 98.1 07/22/2022   PLT 254 07/22/2022    Radiographic Findings: DG Chest 2 View  Result Date: 08/09/2022 CLINICAL DATA:  Community acquired pneumonia. EXAM: CHEST - 2 VIEW COMPARISON:  July 07, 2022. FINDINGS: The heart size and mediastinal contours are within normal limits. Left lung is clear. Right midlung opacity is mildly decreased compared to prior exam suggesting improving pneumonia, although residual inflammation or postinfectious scarring remains. The visualized skeletal structures are unremarkable. IMPRESSION: Right midlung opacity is mildly decreased compared to prior exam suggesting improving pneumonia, although residual inflammation or postinfectious scarring remains. Continued radiographic follow-up is recommended to resolution. Electronically Signed   By: Lupita Raider M.D.   On: 08/09/2022 15:16    Impression:  The encounter diagnosis was Type B2 thymoma (HCC).   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 Presented with stroke like symptoms in January 2024. Work-up incdentally revealed an anterior mediastinal mass and myasthenia gravis.      The patient is recovering from the effects of radiation.  ***  Plan:  ***   *** minutes of total time was spent for this patient encounter, including preparation, face-to-face counseling with the patient and coordination of care, physical exam, and  documentation of the encounter. ____________________________________  Billie Lade, PhD, MD  This document serves as a record of services personally performed by Antony Blackbird, MD. It was created on his behalf by Neena Rhymes, a trained medical scribe. The creation of this record is based on the scribe's personal observations and the provider's statements to them. This document has been checked and approved by the attending provider.

## 2022-08-19 NOTE — Progress Notes (Signed)
  Radiation Oncology         [phone] ________________________________  Name: Faith Massey MRN: [id number]  Date: 08/22/2022  DOB: 12/21/1945  End of Treatment Note  Diagnosis: The encounter diagnosis was Type B2 thymoma (HCC).   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 Presented with stroke like symptoms in January 2024. Work-up incdentally revealed an anterior mediastinal mass and myasthenia gravis.      Indication for treatment: Curative        Radiation treatment dates: 06/09/22 through 07/21/22   Site/dose: Mediastinum - 54 Gy delivered in 30 Fx at 1.8 Gy/Fx  Beams/energy: 6X  Technique/Mode: IMRT, Photon  Narrative: The patient tolerated radiation treatment relatively well.  During her final weekly treatment check on 07/19/22, the patient endorsed middle to lower back pain, severe fatigue, dry cough, shortness of breath, and mild pain when swallowing. She was taken to the operating room 1 week before her final treatment after cone beam imaging showed progression of her pneumonia. There was no evidence of PE but she was recommended admission. She declined admission and deferred to outpatient treatment where she was started on doxycycline and Augmentin. She developed diarrhea with the antibiotic regimen and started a probiotic. She was also approved for IVIG which was tentatively scheduled to start the week after she completed treatment.   Plan: The patient has completed radiation treatment. The patient will return to radiation oncology clinic for routine followup in one month. I advised them to call or return sooner if they have any questions or concerns related to their recovery or treatment.  -----------------------------------  Billie Lade, PhD, MD  This document serves as a record of services personally performed by Antony Blackbird, MD. It was created on his behalf by Neena Rhymes, a trained medical scribe. The creation of this  record is based on the scribe's personal observations and the provider's statements to them. This document has been checked and approved by the attending provider.

## 2022-08-22 ENCOUNTER — Ambulatory Visit
Admission: RE | Admit: 2022-08-22 | Discharge: 2022-08-22 | Disposition: A | Discharge status: Home / Self Care | Payer: Medicare HMO | Source: Ambulatory Visit | Attending: Radiation Oncology | Admitting: Radiation Oncology

## 2022-08-22 ENCOUNTER — Other Ambulatory Visit: Payer: Self-pay

## 2022-08-22 ENCOUNTER — Encounter: Payer: Self-pay | Admitting: Radiation Oncology

## 2022-08-22 VITALS — BP 141/67 | HR 82 | Temp 97.8°F | Resp 20 | Ht <= 58 in | Wt 116.8 lb

## 2022-08-22 DIAGNOSIS — R222 Localized swelling, mass and lump, trunk: Secondary | ICD-10-CM | POA: Insufficient documentation

## 2022-08-22 DIAGNOSIS — D4989 Neoplasm of unspecified behavior of other specified sites: Secondary | ICD-10-CM

## 2022-08-22 DIAGNOSIS — C37 Malignant neoplasm of thymus: Secondary | ICD-10-CM | POA: Insufficient documentation

## 2022-08-22 DIAGNOSIS — Z923 Personal history of irradiation: Secondary | ICD-10-CM | POA: Insufficient documentation

## 2022-08-22 DIAGNOSIS — G7000 Myasthenia gravis without (acute) exacerbation: Secondary | ICD-10-CM | POA: Diagnosis not present

## 2022-08-22 NOTE — Progress Notes (Signed)
Faith Massey is here today for follow up post radiation to the chest.  Completed treatment on 07/21/22  Does the patient complain of any of the following: Pain: Reports discomfort to bilateral lower extremities due to swelling.  Shortness of breath w/wo exertion: Yes Cough: No Hemoptysis: No Pain with swallowing: No Swallowing/choking concerns: Yes at times.  Appetite: Good Energy Level: Low Post radiation skin Changes: No    Additional comments if applicable: Patient reports losing hair to head.     BP (!) 141/67 (BP Location: Right Arm, Patient Position: Sitting, Cuff Size: Normal)   Pulse 82   Temp 97.8 F (36.6 C)   Resp 20   Ht 4\' 10"  (1.473 m)   Wt 116 lb 12.8 oz (53 kg)   SpO2 98%   BMI 24.41 kg/m

## 2022-08-23 ENCOUNTER — Encounter: Payer: Self-pay | Admitting: Family Medicine

## 2022-08-24 DIAGNOSIS — G709 Myoneural disorder, unspecified: Secondary | ICD-10-CM | POA: Diagnosis not present

## 2022-08-24 DIAGNOSIS — G7001 Myasthenia gravis with (acute) exacerbation: Secondary | ICD-10-CM | POA: Diagnosis not present

## 2022-08-25 DIAGNOSIS — G7001 Myasthenia gravis with (acute) exacerbation: Secondary | ICD-10-CM | POA: Diagnosis not present

## 2022-08-25 DIAGNOSIS — G709 Myoneural disorder, unspecified: Secondary | ICD-10-CM | POA: Diagnosis not present

## 2022-09-07 ENCOUNTER — Encounter: Payer: Self-pay | Admitting: Family Medicine

## 2022-09-08 NOTE — Telephone Encounter (Signed)
Will evaluate patient on June 24th 2024.

## 2022-09-09 ENCOUNTER — Other Ambulatory Visit: Payer: Self-pay | Admitting: Family Medicine

## 2022-09-09 ENCOUNTER — Encounter: Payer: Self-pay | Admitting: Family Medicine

## 2022-09-09 DIAGNOSIS — Z5181 Encounter for therapeutic drug level monitoring: Secondary | ICD-10-CM

## 2022-09-09 NOTE — Progress Notes (Addendum)
[facility] [address], [address] [place], [place] [address] [phone]  Date:  09/14/2022   Name:  Faith Massey   DOB:  07/31/1945   MRN:  [id number]  PCP:  Pearline Cables, MD    Chief Complaint: Follow-up (Air in bladder/ discuss CT scan/ discuss chest Xray /Pt tore right bicep and seems to keep "injuring" it Franchot Erichsen Dolan Amen /Would like a pain pill for Bicep pain "tylenol just isn't working")   History of Present Illness:  Faith Massey is a 77 y.o. very pleasant female patient who presents with the following:  Patient seen today with concern of possible air in her bladder, she has noticed herself "passing gas" from her urethra She has never had this in the past  She describes air will come of her bladder just when she is sitting down to pee- does not happen always but keeps recurring She has noted it for maybe 3 - 4 weeks now Not painful No blood Her belly feels fine now   CT abd pelvis with contrast has been ordered- we already did labs in preparation for a contrasted study Most recent visit with myself was in May - at that time she was following up from [facility] History of hypertension, CVA, thymoma, myasthenia gravis, remote breast cancer 1995, hyperlipidemia, osteoporosis, pre-diabetes   She was dx with MG and had a stroke and dx of thymoma this year  She hurt her right biceps this spring- perhaps in March.  She thought this seemed to be okay, but it is continued to bother her and has gotten worse recently.  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 Antony Blackbird   Hyperlipidemia    Hypertension    Myasthenia gravis (HCC)    Dx'd 03/2022   Osteoporosis 10/12/2012    Past Surgical History:  Procedure Laterality Date   ABDOMINAL HYSTERECTOMY  03/22/2003   BACK SURGERY  03/21/1998   BREAST SURGERY     MASTECTOMY Bilateral 03/21/1993    Social History   Tobacco Use   Smoking status: Never   Smokeless tobacco: Never  Vaping Use   Vaping Use: Never used  Substance Use Topics   Alcohol use: Yes    Alcohol/week: 1.0 standard drink of alcohol    Types: 1 Glasses of wine per week    Comment: occasional   Drug use: No    Family History  Problem Relation Age of Onset   Stroke Mother    Hypertension Mother    Myasthenia gravis Mother    Hypertension Father     Allergies  Allergen Reactions   Prednisone Dermatitis, Hypertension, Palpitations, Shortness Of Breath and Swelling    Patient reports having an intolerance to medication. Reports medication caused swelling and metallic taste in mouth.    Medication list has been reviewed and updated.  Current Outpatient Medications on File Prior to Visit  Medication Sig Dispense Refill   apixaban (ELIQUIS) 5 MG TABS tablet Take 1 tablet (5 mg total) by mouth 2 (two) times daily. 60 tablet 2   aspirin EC 81 MG tablet Take 1 tablet (81 mg total) by mouth daily. Swallow whole. 30 tablet 12   Cholecalciferol  (VITAMIN D-3) 5000 UNITS TABS Take 5,000 Units by mouth daily.     COLLAGEN PO Take 1 Scoop by mouth daily.     lisinopril (ZESTRIL) 40 MG tablet Take 1 tablet (40 mg total) by mouth daily. 90 tablet 3   Multiple Vitamins-Minerals (MULTIVITAMIN WITH MINERALS) tablet Take 1 tablet by mouth daily.     nystatin (MYCOSTATIN) 100000 UNIT/ML suspension Take 5 mLs (500,000 Units total) by mouth 4 (four) times daily. 500 mL 1   pyridostigmine (MESTINON) 60 MG tablet Take 1 tablet (60 mg total) by mouth 3 (three) times daily. 90 tablet 6   ezetimibe (ZETIA) 10 MG tablet Take 1 tablet (10 mg total) by mouth daily. (Patient not taking: Reported on 07/22/2022) 30 tablet 3   No current facility-administered medications on file prior to visit.    Review of Systems:  As per HPI- otherwise negative.   Physical Examination: Vitals:   09/14/22 1519  BP: 136/64  Pulse: 94  Resp: 18  Temp: 98.3 F (36.8 C)  SpO2: 96%   Vitals:   09/14/22 1519  Weight: 117 lb (53.1 kg)  Height: 4\' 10"  (1.473 m)   Body mass index is 24.45 kg/m. Ideal Body Weight: Weight in (lb) to have BMI = 25: 119.4  GEN: no acute distress.  Petite build, looks well HEENT: Atraumatic, Normocephalic.  Ears and Nose: No external deformity. CV: RRR, No M/G/R. No JVD. No thrill. No extra heart sounds. PULM: CTA B, no wheezes, crackles, rhonchi. No retractions. No resp. distress. No accessory muscle use. ABD: S, NT, ND, +BS. No rebound. No HSM. EXTR: No c/c/e PSYCH: Normally interactive. Conversant.  Limited pelvic exam performed.  External vulva is normal.  Speculum exam shows no urethral abnormality, no apparent vaginal abnormality She is status post hysterectomy BLE edema -roughly equivalent both sides.  Per patient this is getting better.  Most edema is in her feet The contour of the right biceps muscle appears subtly abnormal compared with the left-she may have a biceps tendon rupture.  She has pain with manipulation of her  right arm, decent right bicep strength.  She has a lot of pain with range of motion of her right shoulder Assessment and Plan: Tear of right biceps muscle, subsequent encounter - Plan: Ambulatory referral to Orthopedic Surgery, traMADol (ULTRAM) 50 MG tablet  Pneumaturia - Plan: Urine Culture  Patient seen today with a couple of concerns.  As above, she seems to have injured her biceps muscle a few months ago.  Of note, she was on chronic steroids for myasthenia gravis.  At this time I suspect she may have a biceps tendon tear and potentially a rotator cuff tear as well.  Referral made  to orthopedics.  She notes uncontrolled pain, would like a refill for tramadol which I am glad to provide  Concern of pneumaturia.  Will obtain a urine culture to rule out UTI, CT scan has been ordered  Signed Abbe Amsterdam, MD  addnd 6/27, received urine culture.  Message to patient  Results for orders placed or performed in visit on 09/14/22  Urine Culture   Specimen: Urine  Result Value Ref Range   MICRO NUMBER: [id number]    SPECIMEN QUALITY: Adequate    Sample Source URINE    STATUS: FINAL    Result:      Less than 10,000 CFU/mL of single Gram negative organism isolated. No further testing will be performed. If clinically indicated, recollection using a method to minimize contamination, with prompt transfer to Urine Culture Transport Tube, is recommended.

## 2022-09-10 NOTE — Progress Notes (Signed)
Thymoma uncertain if malignant

## 2022-09-12 ENCOUNTER — Ambulatory Visit (INDEPENDENT_AMBULATORY_CARE_PROVIDER_SITE_OTHER): Payer: Medicare HMO | Admitting: Neurology

## 2022-09-12 ENCOUNTER — Encounter: Payer: Self-pay | Admitting: Family Medicine

## 2022-09-12 ENCOUNTER — Encounter: Payer: Self-pay | Admitting: Neurology

## 2022-09-12 ENCOUNTER — Other Ambulatory Visit (INDEPENDENT_AMBULATORY_CARE_PROVIDER_SITE_OTHER): Payer: Medicare HMO

## 2022-09-12 VITALS — BP 132/60 | HR 98 | Ht <= 58 in | Wt 116.0 lb

## 2022-09-12 DIAGNOSIS — R7309 Other abnormal glucose: Secondary | ICD-10-CM | POA: Diagnosis not present

## 2022-09-12 DIAGNOSIS — G709 Myoneural disorder, unspecified: Secondary | ICD-10-CM | POA: Diagnosis not present

## 2022-09-12 DIAGNOSIS — Z5181 Encounter for therapeutic drug level monitoring: Secondary | ICD-10-CM

## 2022-09-12 DIAGNOSIS — D4989 Neoplasm of unspecified behavior of other specified sites: Secondary | ICD-10-CM | POA: Diagnosis not present

## 2022-09-12 DIAGNOSIS — R799 Abnormal finding of blood chemistry, unspecified: Secondary | ICD-10-CM | POA: Diagnosis not present

## 2022-09-12 DIAGNOSIS — R7989 Other specified abnormal findings of blood chemistry: Secondary | ICD-10-CM | POA: Diagnosis not present

## 2022-09-12 DIAGNOSIS — N390 Urinary tract infection, site not specified: Secondary | ICD-10-CM

## 2022-09-12 DIAGNOSIS — R3989 Other symptoms and signs involving the genitourinary system: Secondary | ICD-10-CM

## 2022-09-12 LAB — BASIC METABOLIC PANEL
BUN: 20 mg/dL (ref 6–23)
CO2: 28 mEq/L (ref 19–32)
Calcium: 9.5 mg/dL (ref 8.4–10.5)
Chloride: 106 mEq/L (ref 96–112)
Creatinine, Ser: 0.7 mg/dL (ref 0.40–1.20)
GFR: 84.87 mL/min (ref >60.00)
Glucose, Bld: 99 mg/dL (ref 70–99)
Potassium: 4.4 mEq/L (ref 3.5–5.1)
Sodium: 142 mEq/L (ref 135–145)

## 2022-09-12 NOTE — Progress Notes (Signed)
Chief Complaint  Patient presents with   Follow-up    Rm 13, with friend adrianne, stopped prednisone, IVIG treatments going well      ASSESSMENT AND PLAN  Faith Massey is a 77 y.o. female  Seropositive generalized myasthenia gravis,  Presenting with lack of stamina, excessive fatigue, shortness of breath with exertion since [facility] 2023 r, also developed fatigable right ptosis, hoarse voice head drop at her worst,  Diagnosis is confirmed by positive acetylcholine receptor binding, blocking antibody,  CT of the chest showed soft tissue mass at the right anterior mediastinum, 2.3 x 4 x 5.3 cm, likely represents adenoma or other thymic pathology,   Status post robotic thyroidectomy by Dr. 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, completed radiation therapy in May 2024,  She was doing very well postsurgically initially, no longer have head drop,  Worsening myasthenia gravis symptoms postsurgically, that concurrent with her upper respiratory infection, noticeable moderate proximal upper and lower extremity weakness, including neck flexion weakness,  Was started on prednisone tapering 40 mg daily since May 12, 2022, 5 mg decrement every week, complains significant side effect, moon face, pitting edema of lower extremity,  Started home IVIG, 2 g/kg loading dose in May 2024, followed by 1 g/kg maintenance dose, reported significant improvement after that, was able to taper off prednisone, examination on September 12, 2022, aft 3 rounds of IVIG, she has no noticeable bulbar limb muscle weakness, continue IVIG,  Laboratory evaluations   Return To Clinic With NP In 6 Months    DIAGNOSTIC DATA (LABS, IMAGING, TESTING) - I reviewed patient records, labs, notes, testing and imaging myself where available.   MEDICAL HISTORY:  Faith Massey is a 77 year old female, accompanied by her sister Darl Pikes, seen in request by Dr.  Micki Riley, for evaluation of myasthenia gravis,   I reviewed and summarized the referring note.PMHX HTN HLD Breast Cancer, s/p double mastectomy in 1990  Patient lives in independent living, very active, has not seen her primary care since beginning of 2023,  In December 2023, she noticed lack of stamina, easily fatigued, shortness of breath walking her dog walking up hills,  Woke up March 25, 2022, noticed right ptosis, intermittent hoarse voice, which has been persistent that was noticed by her church friend, and family members, who urged her to call primary care on January 8, who directed her to emergency room, she had a stroke evaluation, personally reviewed MRI of the brain March 28, 2022, 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 Pearlean Brownie on March 31, 2022, she was noted to have right ptosis, mild bulbar weakness,  Acetylcholine receptor antibody was positive, blocking 51, binding 27.8, confirmed the diagnosis of myasthenia gravis, she has pending appointment with cardiothoracic surgeon Dr. Dorris Fetch on April 13, 2022  Rest of the laboratory evaluation showed normal CPK TSH, A1c 5.7, lipid panel LDL of 125, CBC WBC was elevated 12, normal hemoglobin of 13, CMP showed calcium 8.7, normal kidney function  Also discussed with her son Jeannett Senior over the phone in detail about her diagnosis,  UPDATE May 11 2022: She is  companied by her daughter at today's clinical visit, status post robotic assisted thyroidectomy by Dr. Dorris Fetch on April 25, 2022, Her postoperative course was uncomplicated and she went home on day 2.,  Recovered very well,  pathology showed benign thymoma  A. THYMUS, THYMECTOMY:  - Thymoma, type B2  - Focal extension into thymic fat present  - Margins appear focally involved  - See oncology table and comment   B. LYMPH NODE, MEDIASTINAL, BIOPSY:  - Three benign lymph nodes (0/3)   She was seen by Dr. Dorris Fetch on May 10, 2022, she began to complains cough congestion on May 07, 2022, no fever,  CT angiogram of chest chest on May 10 2022:  1. No evidence of pulmonary embolism or other acute intrathoracic process. 2. Previously seen anterior mediastinal mass has been resected. No residual mass or fluid collection within the anterior mediastinum. 3. Aortic atherosclerosis (ICD10-I70.0).  She was doing very well postsurgically, prior to surgery, she has had drop, shortness of breath with minimal exertion, mild dysphagia, has to be chew carefully, intermittent ptosis,  Few days after surgery, she has significant improvement but concurrent with her upper respiratory symptoms, she noticed worsening myasthenia gravis symptoms, more shortness of breath, difficult to lie flat, has been sleeping in a sitting position over the past couple days, denies fever, denied double vision, no significant head drop,  She is taking Mestinon 60 mg 3 times daily  UPDATE June 03 2022: She is accompanied by her friend at today's visit, was started on prednisone tapering 40 mg daily since May 12, 2022, 5 mg decrement every week, currently on 30 mg daily, complains of side effect metallic taste in her mouth, moody, worsening glucose, A1c was 6.1  Her muscle strength showed some improvement, she can sleep in her bed now, but continue noticeable upper and lower extremity weakness, difficulty raising arm overhead to her cabinet, get tired easily, losing balance, mild swallowing difficulty if she chew too fast, denied double vision  Pathology of thymus showed Type B2 thymoma with focal extension into the thymic fat and  focally involved margins.  Evaluated by radiation oncologist Dr. Roselind Messier on May 30 2022, proceed with radiation daily for 6 weeks   UPDATE June 24th 2024: She is receiving home IVIG, loading dose in May 2024, 1g/kg x2, noticed improvement after that, being off prednisone since June 2024, did not notice worsening weakness, continue taking Mestinon 60 mg 2-3 times a day, toward her right biceps muscle few months ago, still dealing with significant pain, she walks her 15 pounds dog few times each day, but complains of unsteady gait, use a cane as needed  Today's examination showed no significant extraocular muscle, bulbar or limb muscle weakness   Physical examinations:   Vitals:   09/12/22 1140  Weight: 116 lb (52.6 kg)  Height: 4\' 10"  (1.473 m)     PHYSICAL EXAMNIATION:  Gen: NAD, conversant, well nourised, well groomed                     Cardiovascular: Regular rate rhythm, no peripheral edema, warm, nontender. Eyes: Conjunctivae clear without exudates or hemorrhage Neck: Supple, no carotid bruits. Pulmonary: Clear to auscultation bilaterally   NEUROLOGICAL EXAM:  MENTAL STATUS: Speech/cognition: Awake, alert, oriented to history taking and casual conversation CRANIAL NERVES: CN II: Visual fields are full to confrontation. Pupils are round equal and briskly reactive to light. CN III, IV, VI: extraocular movement are normal.  No ptosis, extraocular movement abnormality noted,  CN V: Facial sensation is intact to light touch CN VII: No significant eye closure, cheek puff weakness, CN VIII: Hearing is normal to causal conversation. CN IX, X: Phonation is normal. CN XI: Head turning and shoulder shrug are intact  MOTOR: Mild neck flexion, shoulder abduction, external rotation, elbow flexion, bilateral hip flexion weakness  REFLEXES: Reflexes are 1  and symmetric at the biceps, triceps, knees, and ankles. Plantar responses are flexor.  SENSORY: Intact to light touch, pinprick  and vibratory sensation are intact in fingers and toes.  Bilateral lower extremity pitting edema  COORDINATION: There is no trunk or limb dysmetria noted.  GAIT/STANCE: Push-up to get up from seated position, valgrus knee, mildly unsteady  REVIEW OF SYSTEMS:  Full 14 system review of systems performed and notable only for as above All other review of systems were negative.   ALLERGIES: Allergies  Allergen Reactions   Prednisone Dermatitis, Hypertension, Palpitations, Shortness Of Breath and Swelling    Patient reports having an intolerance to medication. Reports medication caused swelling and metallic taste in mouth.    HOME MEDICATIONS: Current Outpatient Medications  Medication Sig Dispense Refill   apixaban (ELIQUIS) 5 MG TABS tablet Take 1 tablet (5 mg total) by mouth 2 (two) times daily. 60 tablet 2   aspirin EC 81 MG tablet Take 1 tablet (81 mg total) by mouth daily. Swallow whole. 30 tablet 12   Cholecalciferol (VITAMIN D-3) 5000 UNITS TABS Take 5,000 Units by mouth daily.     COLLAGEN PO Take 1 Scoop by mouth daily.     ezetimibe (ZETIA) 10 MG tablet Take 1 tablet (10 mg total) by mouth daily. (Patient not taking: Reported on 07/22/2022) 30 tablet 3   lisinopril (ZESTRIL) 40 MG tablet Take 1 tablet (40 mg total) by mouth daily. 90 tablet 3   Multiple Vitamins-Minerals (MULTIVITAMIN WITH MINERALS) tablet Take 1 tablet by mouth daily.     nystatin (MYCOSTATIN) 100000 UNIT/ML suspension Take 5 mLs (500,000 Units total) by mouth 4 (four) times daily. 500 mL 1   pyridostigmine (MESTINON) 60 MG tablet Take 1 tablet (60 mg total) by mouth 3 (three) times daily. 90 tablet 6   No current facility-administered medications for this visit.    PAST MEDICAL HISTORY: Past Medical History:  Diagnosis Date   Breast CA [facility])    History of radiation therapy    Chest 06/09/2022 - 07/21/2022 - Dr. Antony Blackbird   Hyperlipidemia    Hypertension    Myasthenia gravis (HCC)    Dx'd 03/2022    Osteoporosis 10/12/2012    PAST SURGICAL HISTORY: Past Surgical History:  Procedure Laterality Date   ABDOMINAL HYSTERECTOMY  03/22/2003   BACK SURGERY  03/21/1998   BREAST SURGERY     MASTECTOMY Bilateral 03/21/1993    FAMILY HISTORY: Family History  Problem Relation Age of Onset   Stroke Mother    Hypertension Mother    Myasthenia gravis Mother    Hypertension Father     SOCIAL HISTORY: Social History   Socioeconomic History   Marital status: Divorced    Spouse name: Not on file   Number of children: Not on file   Years of education: Not on file   Highest education level: Bachelor's degree (e.g., BA, AB, BS)  Occupational History   Not on file  Tobacco Use   Smoking status: Never   Smokeless tobacco: Never  Vaping Use   Vaping Use: Never used  Substance and Sexual Activity   Alcohol use: Yes  Alcohol/week: 1.0 standard drink of alcohol    Types: 1 Glasses of wine per week    Comment: occasional   Drug use: No   Sexual activity: Not Currently  Other Topics Concern   Not on file  Social History Narrative   Not on file   Social Determinants of Health   Financial Resource Strain: Medium Risk (06/21/2022)   Overall Financial Resource Strain (CARDIA)    Difficulty of Paying Living Expenses: Somewhat hard  Food Insecurity: No Food Insecurity (06/21/2022)   Hunger Vital Sign    Worried About Running Out of Food in the Last Year: Never true    Ran Out of Food in the Last Year: Never true  Transportation Needs: Unmet Transportation Needs (07/15/2022)   PRAPARE - Administrator, Civil Service (Medical): Yes    Lack of Transportation (Non-Medical): No  Physical Activity: Insufficiently Active (06/21/2022)   Exercise Vital Sign    Days of Exercise per Week: 5 days    Minutes of Exercise per Session: 20 min  Stress: Stress Concern Present (06/21/2022)   [employer] of Occupational Health - Occupational Stress Questionnaire    Feeling of Stress : To  some extent  Social Connections: Moderately Integrated (06/21/2022)   Social Connection and Isolation Panel [NHANES]    Frequency of Communication with Friends and Family: More than three times a week    Frequency of Social Gatherings with Friends and Family: More than three times a week    Attends Religious Services: More than 4 times per year    Active Member of [employer] or Organizations: Yes    Attends Banker Meetings: More than 4 times per year    Marital Status: Divorced  Intimate Partner Violence: Not At Risk (06/22/2022)   Humiliation, Afraid, Rape, and Kick questionnaire    Fear of Current or Ex-Partner: No    Emotionally Abused: No    Physically Abused: No    Sexually Abused: No     Levert Feinstein, M.D. Ph.D.  [facility] [address] [place], [place] [address] Ph: [phone] Fax: [phone]

## 2022-09-13 ENCOUNTER — Telehealth: Payer: Self-pay | Admitting: Neurology

## 2022-09-13 LAB — HGB A1C W/O EAG: Hgb A1c MFr Bld: 5.3 % (ref 4.8–5.6)

## 2022-09-13 LAB — COMPREHENSIVE METABOLIC PANEL
ALT: 27 IU/L (ref 0–32)
AST: 28 IU/L (ref 0–40)
Albumin: 4 g/dL (ref 3.8–4.8)
Alkaline Phosphatase: 85 IU/L (ref 44–121)
BUN/Creatinine Ratio: 22 (ref 12–28)
BUN: 17 mg/dL (ref 8–27)
Bilirubin Total: 0.4 mg/dL (ref 0.0–1.2)
CO2: 22 mmol/L (ref 20–29)
Calcium: 9.6 mg/dL (ref 8.7–10.3)
Chloride: 105 mmol/L (ref 96–106)
Creatinine, Ser: 0.77 mg/dL (ref 0.57–1.00)
Globulin, Total: 2.4 g/dL (ref 1.5–4.5)
Glucose: 78 mg/dL (ref 70–99)
Potassium: 4.1 mmol/L (ref 3.5–5.2)
Sodium: 142 mmol/L (ref 134–144)
Total Protein: 6.4 g/dL (ref 6.0–8.5)
eGFR: 81 mL/min/{1.73_m2} (ref >59)

## 2022-09-13 LAB — CBC WITH DIFFERENTIAL
Basophils Absolute: 0.1 10*3/uL (ref 0.0–0.2)
Basos: 1 %
EOS (ABSOLUTE): 0.1 10*3/uL (ref 0.0–0.4)
Eos: 1 %
Hematocrit: 37.5 % (ref 34.0–46.6)
Hemoglobin: 12 g/dL (ref 11.1–15.9)
Immature Grans (Abs): 0.1 10*3/uL (ref 0.0–0.1)
Immature Granulocytes: 1 %
Lymphocytes Absolute: 1.3 10*3/uL (ref 0.7–3.1)
Lymphs: 12 %
MCH: 33.1 pg — ABNORMAL HIGH (ref 26.6–33.0)
MCHC: 32 g/dL (ref 31.5–35.7)
MCV: 104 fL — ABNORMAL HIGH (ref 79–97)
Monocytes Absolute: 1.2 10*3/uL — ABNORMAL HIGH (ref 0.1–0.9)
Monocytes: 11 %
Neutrophils Absolute: 8 10*3/uL — ABNORMAL HIGH (ref 1.4–7.0)
Neutrophils: 74 %
RBC: 3.62 x10E6/uL — ABNORMAL LOW (ref 3.77–5.28)
RDW: 13.9 % (ref 11.7–15.4)
WBC: 10.6 10*3/uL (ref 3.4–10.8)

## 2022-09-13 LAB — TSH: TSH: 0.848 u[IU]/mL (ref 0.450–4.500)

## 2022-09-13 LAB — LACTATE DEHYDROGENASE: LDH: 309 IU/L — ABNORMAL HIGH (ref 119–226)

## 2022-09-13 NOTE — Telephone Encounter (Signed)
Pt stated she needs an earlier appointment that Dec. Stated her last IVIG would be in early September and she will need to see the provider then.

## 2022-09-14 ENCOUNTER — Encounter: Payer: Self-pay | Admitting: Family Medicine

## 2022-09-14 ENCOUNTER — Ambulatory Visit (INDEPENDENT_AMBULATORY_CARE_PROVIDER_SITE_OTHER): Payer: Medicare HMO | Admitting: Family Medicine

## 2022-09-14 VITALS — BP 136/64 | HR 94 | Temp 98.3°F | Resp 18 | Ht <= 58 in | Wt 117.0 lb

## 2022-09-14 DIAGNOSIS — R3989 Other symptoms and signs involving the genitourinary system: Secondary | ICD-10-CM

## 2022-09-14 DIAGNOSIS — S46211D Strain of muscle, fascia and tendon of other parts of biceps, right arm, subsequent encounter: Secondary | ICD-10-CM | POA: Diagnosis not present

## 2022-09-14 DIAGNOSIS — G7001 Myasthenia gravis with (acute) exacerbation: Secondary | ICD-10-CM | POA: Diagnosis not present

## 2022-09-14 MED ORDER — TRAMADOL HCL 50 MG PO TABS
50.0000 mg | ORAL_TABLET | Freq: Two times a day (BID) | ORAL | 0 refills | Status: DC | PRN
Start: 2022-09-14 — End: 2022-11-14

## 2022-09-14 NOTE — Patient Instructions (Addendum)
It was great to see again today.  Stop by imaging on the ground floor and set up your CT scan today- can do chest x-ray as well .  I will be in touch with your CT report and your urine culture soon as possible.  I also put in for an orthopedic consultation for you, see information below.  Orthopedics should call you to schedule an appointment, but feel free to go ahead and call them  Use tramadol as needed for pain- can make you feel a bit sleepy!

## 2022-09-15 ENCOUNTER — Encounter: Payer: Self-pay | Admitting: Family Medicine

## 2022-09-15 DIAGNOSIS — G709 Myoneural disorder, unspecified: Secondary | ICD-10-CM | POA: Diagnosis not present

## 2022-09-15 DIAGNOSIS — G7001 Myasthenia gravis with (acute) exacerbation: Secondary | ICD-10-CM | POA: Diagnosis not present

## 2022-09-15 LAB — URINE CULTURE
MICRO NUMBER:: 15130278
SPECIMEN QUALITY:: ADEQUATE

## 2022-09-16 ENCOUNTER — Ambulatory Visit (HOSPITAL_BASED_OUTPATIENT_CLINIC_OR_DEPARTMENT_OTHER): Payer: Medicare HMO

## 2022-09-16 DIAGNOSIS — G709 Myoneural disorder, unspecified: Secondary | ICD-10-CM | POA: Diagnosis not present

## 2022-09-16 DIAGNOSIS — G7001 Myasthenia gravis with (acute) exacerbation: Secondary | ICD-10-CM | POA: Diagnosis not present

## 2022-09-20 ENCOUNTER — Encounter: Payer: Self-pay | Admitting: Physician Assistant

## 2022-09-20 ENCOUNTER — Ambulatory Visit (HOSPITAL_BASED_OUTPATIENT_CLINIC_OR_DEPARTMENT_OTHER)
Admission: RE | Admit: 2022-09-20 | Discharge: 2022-09-20 | Disposition: A | Discharge status: Home / Self Care | Payer: Medicare HMO | Source: Ambulatory Visit | Attending: Family Medicine | Admitting: Family Medicine

## 2022-09-20 ENCOUNTER — Ambulatory Visit (INDEPENDENT_AMBULATORY_CARE_PROVIDER_SITE_OTHER): Payer: Medicare HMO

## 2022-09-20 ENCOUNTER — Ambulatory Visit: Payer: Medicare HMO | Admitting: Physician Assistant

## 2022-09-20 ENCOUNTER — Encounter: Payer: Self-pay | Admitting: Family Medicine

## 2022-09-20 DIAGNOSIS — J189 Pneumonia, unspecified organism: Secondary | ICD-10-CM | POA: Diagnosis not present

## 2022-09-20 DIAGNOSIS — N390 Urinary tract infection, site not specified: Secondary | ICD-10-CM | POA: Diagnosis not present

## 2022-09-20 DIAGNOSIS — I700 Atherosclerosis of aorta: Secondary | ICD-10-CM | POA: Diagnosis not present

## 2022-09-20 DIAGNOSIS — M25511 Pain in right shoulder: Secondary | ICD-10-CM | POA: Diagnosis not present

## 2022-09-20 DIAGNOSIS — G8929 Other chronic pain: Secondary | ICD-10-CM

## 2022-09-20 DIAGNOSIS — K5730 Diverticulosis of large intestine without perforation or abscess without bleeding: Secondary | ICD-10-CM | POA: Diagnosis not present

## 2022-09-20 DIAGNOSIS — B9689 Other specified bacterial agents as the cause of diseases classified elsewhere: Secondary | ICD-10-CM | POA: Diagnosis not present

## 2022-09-20 DIAGNOSIS — D7389 Other diseases of spleen: Secondary | ICD-10-CM | POA: Diagnosis not present

## 2022-09-20 DIAGNOSIS — K429 Umbilical hernia without obstruction or gangrene: Secondary | ICD-10-CM | POA: Insufficient documentation

## 2022-09-20 DIAGNOSIS — R3989 Other symptoms and signs involving the genitourinary system: Secondary | ICD-10-CM | POA: Diagnosis not present

## 2022-09-20 MED ORDER — IOHEXOL 300 MG/ML  SOLN
100.0000 mL | Freq: Once | INTRAMUSCULAR | Status: AC | PRN
  Administered 2022-09-20: 100 mL via INTRAVENOUS

## 2022-09-20 NOTE — Addendum Note (Signed)
Addended by: Wendi Maya on: 09/20/2022 10:58 AM   Modules accepted: Orders

## 2022-09-20 NOTE — Progress Notes (Signed)
Office Visit Note   Patient: Faith Massey           Date of Birth: 1945/12/06           MRN: [id number] Visit Date: 09/20/2022              Requested by: Pearline Cables, MD [address] [place],  [place] [address] PCP: Pearline Cables, MD   Assessment & Plan: Visit Diagnoses:  1. 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  At this point, discussed that based on her age and demand that we would not treat a proximal biceps rupture surgically.  I discussed referral to Dr. Shon Baton for glenohumeral joint cortisone injection.  Have also sent in a referral for outpatient physical therapy.  She will follow-up with [place] as needed.  Follow-Up Instructions: Return if symptoms worsen or fail to improve.   Orders:  Orders Placed This Encounter  Procedures   XR Shoulder Right   No orders of the defined types were placed in this encounter.     Procedures: No procedures performed   Clinical Data: No additional findings.   Subjective: Chief Complaint  Patient presents with   Right Upper Arm - Pain    HPI patient is a pleasant 77 year old female with underlying myasthenia gravis who comes in today with right shoulder pain.  This began the day after Easter.  She was changing a water filter and simply just screwing something in when she felt a pop to the shoulder.  The pain she has been having is to the upper arm into the biceps.  She initially experienced bruising as well.  Her pain has been constant but worse when she is using her shoulder such as when she was reaching up, doing her hair or sleeping on her right side.  She has noticed weakness to the right upper extremity.  She has been taking Tylenol without relief.  She is on Eliquis and a baby aspirin and is unable to take NSAIDs.  Review of Systems as detailed in HPI.  All others reviewed and are  negative.   Objective: Vital Signs: There were no vitals taken for this visit.  Physical Exam well-developed well-nourished female no acute distress.  Alert and oriented x 3.  Ortho Exam right shoulder exam reveals active forward flexion to about 45 degrees.  I can passively get her to about 100 degrees.  She does have significant pain with empty can testing with 3 out of 5 strength.  Increased weakness with resisted internal rotation.  No pain or weakness with bearhug.  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  No ecchymosis.  She is neurovascular intact distally.  Specialty Comments:  No specialty comments available.  Imaging: XR Shoulder Right  Result Date: 09/20/2022 Degenerative changes of the before meals and glenohumeral joints.  No other acute findings.    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 Antony Blackbird   Hyperlipidemia    Hypertension    Myasthenia gravis (HCC)    Dx'd 03/2022   Osteoporosis 10/12/2012    Family History  Problem Relation Age of Onset   Stroke Mother    Hypertension Mother    Myasthenia gravis Mother    Hypertension Father     Past Surgical History:  Procedure Laterality Date   ABDOMINAL HYSTERECTOMY  03/22/2003   BACK SURGERY  03/21/1998   BREAST SURGERY     MASTECTOMY Bilateral 03/21/1993   Social History   Occupational History   Not on file   Tobacco Use   Smoking status: Never   Smokeless tobacco: Never  Vaping Use   Vaping Use: Never used  Substance and Sexual Activity   Alcohol use: Yes    Alcohol/week: 1.0 standard drink of alcohol    Types: 1 Glasses of wine per week    Comment: occasional   Drug use: No   Sexual activity: Not Currently

## 2022-09-21 ENCOUNTER — Encounter: Payer: Self-pay | Admitting: Family Medicine

## 2022-09-21 ENCOUNTER — Telehealth: Payer: Self-pay | Admitting: Family Medicine

## 2022-09-21 DIAGNOSIS — D7389 Other diseases of spleen: Secondary | ICD-10-CM

## 2022-09-21 DIAGNOSIS — N321 Vesicointestinal fistula: Secondary | ICD-10-CM

## 2022-09-21 NOTE — Telephone Encounter (Signed)
Called pt with CT report. 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    Non- specific splenic lesions- will order MRI to be done in 6 months   CT Abdomen Pelvis W Contrast  Result Date: 09/21/2022 CLINICAL DATA:  UTI, recurrent/complicated (Female) pneumaturia noted by patient EXAM: CT ABDOMEN AND PELVIS WITH CONTRAST TECHNIQUE: Multidetector CT imaging of the abdomen and pelvis was performed using the standard protocol following bolus administration of intravenous contrast. RADIATION DOSE REDUCTION: This exam was performed according to the departmental dose-optimization program which includes automated exposure control, adjustment of the mA and/or kV according to patient size and/or use of iterative reconstruction technique. CONTRAST:  OMNIPAQUE IOHEXOL 300 MG/ML  SOLN COMPARISON:  CT angiography chest from 07/14/2022. FINDINGS: Lower chest: There are subsegmental atelectatic changes in the visualized lung bases. No overt consolidation. No pleural effusion. The heart is normal in size. No pericardial effusion. Partially seen bilateral breast implants. Hepatobiliary: The liver is normal in size. Non-cirrhotic configuration. No suspicious mass. These is mild diffuse hepatic steatosis. No intrahepatic or extrahepatic bile duct dilation. No calcified gallstones. Normal gallbladder wall thickness. No pericholecystic inflammatory changes. Pancreas: Unremarkable. No pancreatic ductal dilatation or surrounding inflammatory changes. Spleen: Normal in size. There are multiple, irregular marginated, subcentimeter hypoattenuating lesions with largest measuring up to 5 x 8 mm, incompletely characterized on the current examination. These are indeterminate and therefore further evaluation with multiphasic MRI abdomen is recommended in 6 months. Adrenals/Urinary Tract: There is thickening of left adrenal gland without discrete  nodule, nonspecific but commonly seen with adrenal hyperplasia. Right adrenal gland is within normal limits. No suspicious renal mass. No hydronephrosis. No renal or ureteric calculi. There is small amount of air in the nondependent portion of the urinary bladder. There is focal irregular thickening of the left posteroinferior bladder wall measuring up to 7 mm. At this location, there is also asymmetric prominence of left vaginal cuff and mild circumferential thickening of the proximal sigmoid colon on the background of several diverticula. However, no significant pericolonic fat stranding noted. 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 This explains patient's reported history of pneumaturia. Urinary bladder is otherwise unremarkable. No calculi or perivesical fat stranding. Stomach/Bowel: There is a small probable paraesophageal hiatal hernia. No disproportionate dilation of the small or large bowel loops. No evidence of abnormal bowel wall thickening or inflammatory changes. The appendix is unremarkable. Vascular/Lymphatic: No ascites or pneumoperitoneum. No abdominal or pelvic lymphadenopathy, by size criteria. No aneurysmal dilation of the major abdominal arteries. There are mild peripheral atherosclerotic vascular calcifications of the aorta and its major branches. Reproductive: Surgically absent uterus. There is asymmetric prominence of left-sided vaginal cuff which exhibits loss of fat planes between the left posteroinferior urinary bladder wall and right wall of the proximal sigmoid colon. Other: There is a tiny fat containing umbilical hernia. The soft tissues and abdominal wall are otherwise unremarkable. Musculoskeletal: No suspicious osseous lesions. There are moderate multilevel degenerative changes in the visualized spine. IMPRESSION: 1. Focal irregular thickening of the left posteroinferior  urinary bladder wall with small amount of air in the nondependent portion of the urinary bladder. At this location, there is loss of fat planes between asymmetrically prominent left vaginal cuff and mildly thickened proximal sigmoid colon. 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 This explains patient's reported history of pneumaturia. 2. Multiple subcentimeter hypoattenuating lesions within the spleen, incompletely characterized on the current examination. These are indeterminate and therefore further evaluation with multiphasic MRI abdomen is recommended in 6 months. 3. Multiple other nonacute observations, as described above. Aortic Atherosclerosis (ICD10-I70.0). These results will be called to the ordering clinician or representative by the Radiologist Assistant, and communication documented in the PACS or [employer]. Electronically Signed   By: Jules Schick M.D.   On: 09/21/2022 13:12   DG Chest 2 View  Result Date: 09/20/2022 CLINICAL DATA:  follwo-up pneumonia EXAM: CHEST - 2 VIEW COMPARISON:  08/09/2022 FINDINGS: Linear scarring from the right hilum laterally to the pleural with some decrease in interstitial thickening since previous exam. Stable minimal linear opacities at the left lung base. Heart size and mediastinal contours are within normal limits. No pleural effusion. Multilevel thoracolumbar spondylitic change. IMPRESSION: Improving right lung infiltrate with residual scarring. Electronically Signed   By: Corlis Leak M.D.   On: 09/20/2022 15:20   XR Shoulder Right  Result Date: 09/20/2022 Degenerative changes of the before meals and glenohumeral joints.  No other acute findings.

## 2022-09-26 ENCOUNTER — Telehealth: Payer: Self-pay

## 2022-09-26 NOTE — Telephone Encounter (Signed)
FYI:   Approval number: [id number] Dates approved: 09/23/22 - 03/22/23

## 2022-09-30 ENCOUNTER — Ambulatory Visit (HOSPITAL_COMMUNITY): Payer: Medicare HMO

## 2022-10-04 ENCOUNTER — Encounter: Payer: Self-pay | Admitting: Sports Medicine

## 2022-10-04 ENCOUNTER — Encounter: Payer: Self-pay | Admitting: *Deleted

## 2022-10-04 ENCOUNTER — Ambulatory Visit (INDEPENDENT_AMBULATORY_CARE_PROVIDER_SITE_OTHER): Payer: Medicare HMO | Admitting: Sports Medicine

## 2022-10-04 ENCOUNTER — Other Ambulatory Visit: Payer: Self-pay

## 2022-10-04 DIAGNOSIS — S46211A Strain of muscle, fascia and tendon of other parts of biceps, right arm, initial encounter: Secondary | ICD-10-CM

## 2022-10-04 DIAGNOSIS — G8929 Other chronic pain: Secondary | ICD-10-CM

## 2022-10-04 DIAGNOSIS — M25511 Pain in right shoulder: Secondary | ICD-10-CM | POA: Diagnosis not present

## 2022-10-04 DIAGNOSIS — M19011 Primary osteoarthritis, right shoulder: Secondary | ICD-10-CM | POA: Diagnosis not present

## 2022-10-04 MED ORDER — LIDOCAINE HCL 1 % IJ SOLN
2.0000 mL | INTRAMUSCULAR | Status: AC | PRN
Start: 2022-10-04 — End: 2022-10-04
  Administered 2022-10-04: 2 mL

## 2022-10-04 MED ORDER — METHYLPREDNISOLONE ACETATE 40 MG/ML IJ SUSP
40.0000 mg | INTRAMUSCULAR | Status: AC | PRN
Start: 2022-10-04 — End: 2022-10-04
  Administered 2022-10-04: 40 mg via INTRA_ARTICULAR

## 2022-10-04 MED ORDER — BUPIVACAINE HCL 0.25 % IJ SOLN
2.0000 mL | INTRAMUSCULAR | Status: AC | PRN
Start: 2022-10-04 — End: 2022-10-04
  Administered 2022-10-04: 2 mL via INTRA_ARTICULAR

## 2022-10-04 NOTE — Progress Notes (Signed)
Unable to move arm much due to pain  History of torn bicep

## 2022-10-04 NOTE — Progress Notes (Signed)
Faith Massey - 77 y.o. female MRN [id number]  Date of birth: Dec 03, 1945  Office Visit Note: Visit Date: 10/04/2022 PCP: Pearline Cables, MD Referred by: Pearline Cables, MD  Subjective: Chief Complaint  Patient presents with   Left Shoulder - Pain   HPI: Faith Massey is a pleasant 77 y.o. female who presents today for chronic right shoulder pain.  Initial injury back around Easter - felt a "pop" in the shoulder and noted bruising of the anterior shoulder and arm.  Saw primary physician for this and thought she possibly ruptured bicep tendon but has never had dedicated imaging for this.  Initially was painful but got better although here over the last month or more her shoulder has been bothering her.  Was seen previously and had x-rays which showed glenohumeral joint arthritis.  She has been referred here for further ultrasound evaluation and possible injection.  She did have a referral sent to outpatient physical therapy, her first initial assessment is next week.  [name] states that the pain has been limiting her range of motion.  Pertinent ROS were reviewed with the patient and found to be negative unless otherwise specified above in HPI.   Assessment & Plan: Visit Diagnoses:  1. Chronic right shoulder pain   2. Rupture of right proximal biceps tendon, initial encounter   3. Primary osteoarthritis, right shoulder    Plan: Discussed with [name] that our ultrasound assessment of her right shoulder does confirm that she had a proximal rupture of the long head of the biceps tendon as there is no bicep tendon stated within the groove.  This does show a degree of retraction as well and it is chronic at this point.  She initially got improvement from this but recently her shoulder pain has been worsening.  She does have some arthritis in the shoulder and her range of motion is slightly limited.  Through shared decision-making, did proceed with an ultrasound-guided glenohumeral joint injection  which will hopefully settle down her pain and allow her to progress through therapy.  Would like her to work on range of motion exercises and progressed through physical therapy.  She may use ice, heat or Tylenol for any postinjection pain.  She will follow-up with [name] and/or [name] after she is gone through some of her physical therapy; I am happy to see her back as needed.  Follow-up: Return for f/u in about 1 month with [name] / [name] for shoulder as needed.   Meds & Orders: No orders of the defined types were placed in this encounter.   Orders Placed This Encounter  Procedures   Large Joint Inj   [place] Extrem Up Right Ltd   US Guided Needle Placement - No Linked Charges     Procedures: Large Joint Inj: R glenohumeral on 10/04/2022 2:06 PM Indications: pain Details: 22 G 1.5 in needle, ultrasound-guided posterior approach Medications: 2 mL lidocaine 1 %; 2 mL bupivacaine 0.25 %; 40 mg methylPREDNISolone acetate 40 MG/ML Outcome: tolerated well, no immediate complications  US-guided glenohumeral joint injection, right shoulder After discussion on risks/benefits/indications, informed verbal consent was obtained. A timeout was then performed. The patient was positioned lying lateral recumbent on examination table. The patient's shoulder was prepped with betadine and multiple alcohol swabs and utilizing ultrasound guidance, the patient's glenohumeral joint was identified on ultrasound. Using ultrasound guidance a 22-gauge, 3.5 inch needle with a mixture of 2:2:1 cc's lidocaine:bupivicaine:depomedrol was directed from a lateral to medial direction via in-plane technique  into the glenohumeral joint with visualization of appropriate spread of injectate into the joint. Patient tolerated the procedure well without immediate complications.      Procedure, treatment alternatives, risks and benefits explained, specific risks discussed. Consent was given by the patient. Immediately prior to  procedure a time out was called to verify the correct patient, procedure, equipment, support staff and site/side marked as required. Patient was prepped and draped in the usual sterile fashion.          Clinical History: No specialty comments available.  She reports that she has never smoked. She has never used smokeless tobacco.  Recent Labs    03/28/22 2232 05/11/22 1514 09/12/22 1204  HGBA1C 5.7* 6.1* 5.3    Objective:    Physical Exam  Gen: Well-appearing, in no acute distress; non-toxic CV: Well-perfused. Warm.  Resp: Breathing unlabored on room air; no wheezing. Psych: Fluid speech in conversation; appropriate affect; normal thought process Neuro: Sensation intact throughout. No gross coordination deficits.   Ortho Exam - Right shoulder: + TTP with deep palpation of the bicipital groove.  Inspection of the right shoulder demonstrates no swelling, redness or effusion.  She does have asymmetric bicep musculature indicative of reverse Popeye sign of the right bicep.  Patient does have limited active forward flexion and abduction but is able to be taken further passively.  Mild pain with impingement testing  Imaging: [place] Extrem Up Right Ltd  Result Date: 10/04/2022 Limited musculoskeletal ultrasound of the right upper extremity, right shoulder was performed.  Short axis evaluation of the bicipital groove shows no cortical irregularity of the humeral head or groove of the shoulder.  Within the bicipital groove there is a hypoechoic space with absent visualization of the proximal head of the biceps tendon, indicative of likely proximal bicep tendon tear.  Short and long axis evaluation of the biceps tendon does show retraction into the proximal aspect of the bicep musculature.  Evaluation of the [facility] joint shows at least moderate arthritic changes without significant bursal distention.   Proximal rupture of the long head of the bicep tendon with retraction  US Guided Needle Placement  - No Linked Charges  Result Date: 10/04/2022 Technically successful ultrasound-guided right glenohumeral joint injection.       Past Medical/Family/Surgical/Social History: Medications & Allergies reviewed per EMR, new medications updated. 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 Antony Blackbird   Hyperlipidemia    Hypertension    Myasthenia gravis (HCC)    Dx'd 03/2022   Osteoporosis 10/12/2012   Family History  Problem Relation Age of Onset   Stroke Mother    Hypertension Mother    Myasthenia gravis Mother    Hypertension Father    Past Surgical History:  Procedure Laterality Date   ABDOMINAL HYSTERECTOMY  03/22/2003   BACK SURGERY  03/21/1998   BREAST SURGERY     MASTECTOMY Bilateral 03/21/1993  Social History   Occupational History   Not on file  Tobacco Use   Smoking status: Never   Smokeless tobacco: Never  Vaping Use   Vaping status: Never Used  Substance and Sexual Activity   Alcohol use: Yes    Alcohol/week: 1.0 standard drink of alcohol    Types: 1 Glasses of wine per week    Comment: occasional   Drug use: No   Sexual activity: Not Currently

## 2022-10-06 ENCOUNTER — Encounter: Payer: Self-pay | Admitting: Family Medicine

## 2022-10-06 DIAGNOSIS — G709 Myoneural disorder, unspecified: Secondary | ICD-10-CM | POA: Diagnosis not present

## 2022-10-06 DIAGNOSIS — G7001 Myasthenia gravis with (acute) exacerbation: Secondary | ICD-10-CM | POA: Diagnosis not present

## 2022-10-07 DIAGNOSIS — G7001 Myasthenia gravis with (acute) exacerbation: Secondary | ICD-10-CM | POA: Diagnosis not present

## 2022-10-07 DIAGNOSIS — G709 Myoneural disorder, unspecified: Secondary | ICD-10-CM | POA: Diagnosis not present

## 2022-10-09 NOTE — Therapy (Signed)
OUTPATIENT PHYSICAL THERAPY SHOULDER EVALUATION   Patient Name: Faith Massey MRN: [id number] DOB:06-20-1945, 77 y.o., female Today's Date: 10/10/2022  END OF SESSION:  PT End of Session - 10/10/22 1436     Visit Number 1    Date for PT Re-Evaluation 12/05/22    Authorization Type [employer] MCR    Progress Note Due on Visit 10    PT Start Time 1436    PT Stop Time 1522    PT Time Calculation (min) 46 min    Activity Tolerance Patient tolerated treatment well    Behavior During Therapy [facility] for tasks assessed/performed             Past Medical History:  Diagnosis Date   Breast CA (HCC)    History of radiation therapy    Chest 06/09/2022 - 07/21/2022 - Dr. 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   Myasthenic syndrome (HCC) 04/12/2022   CVA (cerebral vascular accident) (HCC) 03/28/2022   HTN (hypertension) 03/28/2022   Chest mass 03/28/2022   Right knee injury 02/14/2017   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 10/16/2012   Osteoporosis 10/12/2012    PCP: Pearline Cables, MD   REFERRING PROVIDER: Cristie Hem, PA-C   REFERRING DIAG: [phone] (ICD-10-CM) - Chronic right shoulder pain  THERAPY DIAG:  Acute pain of right shoulder  Stiffness of right shoulder, not elsewhere classified  Muscle weakness (generalized)  Cramp and spasm  Rationale for Evaluation and Treatment: Rehabilitation  ONSET DATE: 06/20/22  SUBJECTIVE:                                                                                                                                                                                       SUBJECTIVE STATEMENT: I had so much pain last night I had to take a Tramadol. I can't get a blouse out of the closet. Hurts to reach out. Can't reach behind head and can't lift arm without support of other arm. A little better since injection last week. Just recently went off prednisone which she had for IVIG therapy. Diagnosed with Myasthenia gravis in January of this year (  associated with the mass). 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 Tore biceps when she was changing the filter on her water filtering system, heard pop. She uses cane intermittently due to balance issues from Myasthenia gravis and prednisone. Hand dominance: Right  PERTINENT HISTORY: MG, h/o radiation (March and May 2024) for mediastinal mass, HTN, OP  PAIN:  Are you having pain? Yes: NPRS scale: 2 at rest, up to 9/10 Pain location: anterior shoulder now Pain description: stabbing Aggravating factors: reaching forward, trying to lift Relieving factors: Tramadol  PRECAUTIONS: None  RED FLAGS: None   WEIGHT BEARING RESTRICTIONS: No  FALLS:  Has patient fallen in last 6 months? No  LIVING ENVIRONMENT: Lives with: lives alone Lives in: House/apartment Stairs: No Has following equipment at home: Single point cane  OCCUPATION: retired  PLOF: Independent with household mobility with device  PATIENT GOALS:full function of her R arm  NEXT MD VISIT:   OBJECTIVE:   DIAGNOSTIC FINDINGS:  [place]: Limited musculoskeletal ultrasound of the right upper extremity, right  shoulder was performed.  Short axis evaluation of the bicipital groove  shows no cortical irregularity of the humeral head or groove of the  shoulder.  Within the bicipital groove there is a hypoechoic space with  absent visualization of the proximal head of the biceps tendon, indicative  of likely proximal bicep  tendon tear.  Short and long axis evaluation of  the biceps tendon does show retraction into the proximal aspect of the  bicep musculature.  Evaluation of the [facility] joint shows at least moderate  arthritic changes without significant bursal distention.   PATIENT SURVEYS:  Quick Dash 68.2 / 100 = 68.2 %  COGNITION: Overall cognitive status: Within functional limits for tasks assessed     SENSATION: WFL  POSTURE: R shoulder anterior, depressed R shoulder, scapula and R ilia  UPPER EXTREMITY ROM:   A/P ROM Right eval Left eval  Shoulder flexion 20/157 passively with clunk   Shoulder extension WNL   Shoulder abduction 58/177   Shoulder adduction    Shoulder internal rotation 55/62   Shoulder external rotation 65/85 very uncomfortable   Elbow flexion full   Elbow extension full   (Blank rows = not tested)  UPPER EXTREMITY MMT:  MMT Right eval Left eval  Shoulder flexion 2-   Shoulder extension 4+   Shoulder abduction 4-* with elbow bent   Shoulder adduction    Shoulder internal rotation 4-   Shoulder external rotation 5   Middle trapezius    Lower trapezius    Elbow flexion 4*   Elbow extension 5 mild pain    Wrist pronation    Wrist supination    Grip strength (lbs) 10 12  (Blank rows = not tested)   PALPATION:  Marked UT, pecs, ant shoulder, IS  Decreased stability in ant R shoulder joint   TODAY'S TREATMENT:  DATE:  10/10/22 See pt ed and HEP   PATIENT EDUCATION: Education details: PT eval findings, anticipated POC, and initial HEP  Person educated: Patient Education method: Explanation, Demonstration, and Handouts Education comprehension: verbalized understanding and returned demonstration  HOME EXERCISE PROGRAM: Access Code: [email or web address] URL: [email or web address] Date: 10/10/2022 Prepared by:  Raynelle Fanning  Exercises - Seated Bilateral Shoulder Flexion Towel Slide at Table Top  - 1 x daily - 7 x weekly - 1 sets - 10 reps - Shoulder Flexion Wall Slide with Towel  - 1 x daily - 7 x weekly - 1 sets - 10 reps - Isometric Shoulder Flexion at Wall  - 1 x daily - 7 x weekly - 1 sets - 5 reps - 10 sec hold - Standing Isometric Shoulder Internal Rotation at Doorway  - 1 x daily - 7 x weekly - 1 sets - 5 reps - 10 sec hold - Standing Isometric Shoulder External Rotation with Doorway (Mirrored)  - 1 x daily - 7 x weekly - 1 sets - 5 reps - 10 sec  hold - Supine Scapular Retraction  - 1 x daily - 7 x weekly - 1 sets - 5 reps - 3-5 sec hold - Seated Scapular Retraction  - 1 x daily - 7 x weekly - 1-3 sets - 10 reps - 2-3 sec hold - Seated Scapular Retraction with External Rotation  - 1 x daily - 7 x weekly - 1 sets - 10 reps  ASSESSMENT:  CLINICAL IMPRESSION: Nusayba Cadenas is a 77 y.o. female who was seen today for physical therapy evaluation and treatment for R shoulder pain s/p R proximal biceps rupture on 06/20/22. She is unable to raise her R arm more than 20 degrees unassisted. She demonstrates R shoulder instability and pain with flexion and ER. She has limitations in ROM and strength affecting all ADLS. She is right hand dominant. Rubi will benefit from skilled PT to address these deficits.    OBJECTIVE IMPAIRMENTS: decreased ROM, decreased strength, increased muscle spasms, impaired flexibility, impaired UE functional use, postural dysfunction, and pain.   ACTIVITY LIMITATIONS: carrying, lifting, sleeping, bathing, toileting, dressing, self feeding, reach over head, and hygiene/grooming  PARTICIPATION LIMITATIONS: meal prep, cleaning, laundry, driving, and yard work  PERSONAL FACTORS: Time since onset of injury/illness/exacerbation and 3+ comorbidities: Myasthenia gravis, h/o radiation for mediastinal mass, HTN, OP  are also affecting patient's functional outcome.   REHAB POTENTIAL:  Good  CLINICAL DECISION MAKING: Evolving/moderate complexity  EVALUATION COMPLEXITY: Moderate   GOALS: Goals reviewed with patient? Yes  SHORT TERM GOALS: Target date: 11/07/2022   Patient will be independent with initial HEP.  Baseline:  Goal status: INITIAL  2.  Patient able to sleep without waking from shoulder pain  Baseline:  Goal status: INITIAL   LONG TERM GOALS: Target date: 12/05/2022   Patient will be independent with advanced/ongoing HEP to improve outcomes and carryover.  Baseline:  Goal status: INITIAL  2.  Patient will report 75% improvement in R shoulder pain to improve QOL.  Baseline:  Goal status: INITIAL  3. Improved R grip strength by 5-10#  Baseline:  Goal status: INITIAL  4.  Patient to improve R shoulder AROM to [employer] without pain provocation to allow for increased ease of ADLs.  Baseline:  Goal status: INITIAL  5.  Patient will demonstrate improved functional R UE strength as demonstrated her ability to use her arm to perform grooming and hygiene. Baseline:  Goal status: INITIAL  6  Patient will report  78/100 on Quick Dash to demonstrate improved functional ability.  Baseline: 68.2 / 100 = 68.2 % Goal status: INITIAL    PLAN:  PT FREQUENCY: 2x/week  PT DURATION: 8 weeks  PLANNED INTERVENTIONS: Therapeutic exercises, Therapeutic activity, Neuromuscular re-education, Patient/Family education, Self Care, Joint mobilization, Aquatic Therapy, Dry Needling, Electrical stimulation, Spinal mobilization, Cryotherapy, Moist heat, Taping, Ionotophoresis 4mg /ml Dexamethasone, and Manual therapy  PLAN FOR NEXT SESSION: Review and progress HEP for elbow, forearm and shoulder/RC strength and shoulder ROM. Scapular stabilization, advise shoulder support for sleeping   Solon Palm, PT  10/10/2022, 5:10 PM

## 2022-10-10 ENCOUNTER — Ambulatory Visit: Payer: Medicare HMO | Admitting: Neurology

## 2022-10-10 ENCOUNTER — Ambulatory Visit: Payer: Medicare HMO | Attending: Physician Assistant | Admitting: Physical Therapy

## 2022-10-10 ENCOUNTER — Other Ambulatory Visit: Payer: Self-pay

## 2022-10-10 ENCOUNTER — Encounter: Payer: Self-pay | Admitting: Physical Therapy

## 2022-10-10 DIAGNOSIS — G8929 Other chronic pain: Secondary | ICD-10-CM | POA: Diagnosis not present

## 2022-10-10 DIAGNOSIS — M25511 Pain in right shoulder: Secondary | ICD-10-CM | POA: Insufficient documentation

## 2022-10-10 DIAGNOSIS — M6281 Muscle weakness (generalized): Secondary | ICD-10-CM | POA: Insufficient documentation

## 2022-10-10 DIAGNOSIS — M25611 Stiffness of right shoulder, not elsewhere classified: Secondary | ICD-10-CM | POA: Diagnosis not present

## 2022-10-10 DIAGNOSIS — R252 Cramp and spasm: Secondary | ICD-10-CM | POA: Diagnosis not present

## 2022-10-13 ENCOUNTER — Ambulatory Visit: Payer: Medicare HMO | Admitting: Physical Therapy

## 2022-10-13 ENCOUNTER — Encounter: Payer: Self-pay | Admitting: Physical Therapy

## 2022-10-13 DIAGNOSIS — M25511 Pain in right shoulder: Secondary | ICD-10-CM | POA: Diagnosis not present

## 2022-10-13 DIAGNOSIS — M25611 Stiffness of right shoulder, not elsewhere classified: Secondary | ICD-10-CM

## 2022-10-13 DIAGNOSIS — R252 Cramp and spasm: Secondary | ICD-10-CM

## 2022-10-13 DIAGNOSIS — G8929 Other chronic pain: Secondary | ICD-10-CM | POA: Diagnosis not present

## 2022-10-13 DIAGNOSIS — M6281 Muscle weakness (generalized): Secondary | ICD-10-CM | POA: Diagnosis not present

## 2022-10-13 NOTE — Therapy (Signed)
OUTPATIENT PHYSICAL THERAPY TREATMENT   Patient Name: Faith Massey MRN: [id number] DOB:07/19/1945, 77 y.o., female Today's Date: 10/13/2022  END OF SESSION:  PT End of Session - 10/13/22 1404     Visit Number 2    Date for PT Re-Evaluation 12/05/22    Authorization Type Aetna MCR    Progress Note Due on Visit 10    PT Start Time 1404    PT Stop Time 1450    PT Time Calculation (min) 46 min    Activity Tolerance Patient tolerated treatment well    Behavior During Therapy [facility] for tasks assessed/performed              Past Medical History:  Diagnosis Date   Breast CA (HCC)    History of radiation therapy    Chest 06/09/2022 - 07/21/2022 - Dr. 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   Myasthenic syndrome (HCC) 04/12/2022   CVA (cerebral vascular accident) (HCC) 03/28/2022   HTN (hypertension) 03/28/2022   Chest mass 03/28/2022   Right knee injury 02/14/2017   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 10/16/2012   Osteoporosis 10/12/2012    PCP: Pearline Cables, MD  REFERRING PROVIDER: Cristie Hem, PA-C  REFERRING DIAG: [phone] (ICD-10-CM) - Chronic right shoulder pain  THERAPY DIAG:  Acute pain of right shoulder  Stiffness of right shoulder, not elsewhere classified  Muscle weakness (generalized)  Cramp and spasm  RATIONALE FOR EVALUATION AND TREATMENT: Rehabilitation  ONSET DATE: 06/20/22  NEXT MD VISIT: 11/02/22   SUBJECTIVE:                                                                                                                                                                                       SUBJECTIVE STATEMENT: Pt reports she has been working on the HEP but would like to review it today.  EVAL:  I had so much pain last night I had to take a Tramadol. I can't get a blouse out of the closet. Hurts to reach out. Can't reach behind head and can't lift arm without support of other arm. A little better since  injection last week. Just recently went off prednisone which she had for IVIG therapy. Diagnosed with Myasthenia gravis in January of this year (associated with the mass). 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 Tore biceps when she was changing the filter on her water filtering system, heard pop. She uses cane intermittently due to balance issues from Myasthenia gravis and prednisone.  Hand dominance: Right  PAIN:  Are you having pain? Yes: NPRS scale: 2 at rest, up to 5/10 Pain location: anterior shoulder now Pain description: stabbing Aggravating factors: reaching forward, trying to lift Relieving factors: Tramadol  PERTINENT HISTORY: MG, h/o radiation (March and May 2024) for mediastinal mass, HTN, OP  PRECAUTIONS: None  RED FLAGS: None   WEIGHT BEARING RESTRICTIONS: No  FALLS:  Has patient fallen in last 6 months? No  LIVING ENVIRONMENT: Lives with: lives alone Lives in: House/apartment Stairs: No Has following equipment at home: Single point cane  OCCUPATION: retired  PLOF: Independent with household mobility with device  PATIENT GOALS:full function of her R arm    OBJECTIVE:   DIAGNOSTIC FINDINGS:  [place]: Limited musculoskeletal ultrasound of the right upper extremity, right  shoulder was performed.  Short axis evaluation of the bicipital groove  shows no cortical irregularity of the humeral head or groove of the  shoulder.  Within the bicipital groove there is a hypoechoic space with  absent  visualization of the proximal head of the biceps tendon, indicative  of likely proximal bicep tendon tear.  Short and long axis evaluation of  the biceps tendon does show retraction into the proximal aspect of the  bicep musculature.  Evaluation of the [facility] joint shows at least moderate  arthritic changes without significant bursal distention.   PATIENT SURVEYS:  Quick Dash 68.2 / 100 = 68.2 %  COGNITION: Overall cognitive status: Within functional limits for tasks assessed     SENSATION: WFL  POSTURE: R shoulder anterior, depressed R shoulder, scapula and R ilia  UPPER EXTREMITY ROM:   A/PROM Right eval Left eval  Shoulder flexion 20/157 passively with clunk   Shoulder extension WNL   Shoulder abduction 58/177   Shoulder adduction    Shoulder internal rotation 55/62   Shoulder external rotation 65/85 very uncomfortable   Elbow flexion full   Elbow extension full   (Blank rows = not tested)  UPPER EXTREMITY MMT:  MMT Right eval Left eval  Shoulder flexion 2-   Shoulder extension 4+   Shoulder abduction 4-* with elbow bent   Shoulder adduction    Shoulder internal rotation 4-   Shoulder external rotation 5   Middle trapezius    Lower trapezius    Elbow flexion 4*   Elbow extension 5 mild pain    Wrist pronation    Wrist supination    Grip strength (lbs) 10 12  (Blank rows = not tested)   PALPATION:  Marked UT, pecs, ant shoulder, IS  Decreased stability in ant R shoulder joint   TODAY'S TREATMENT:  DATE:   10/13/22 THERAPEUTIC EXERCISE: to improve flexibility, strength and mobility.  Demonstration, verbal and tactile cues throughout for technique. Pulleys - R shoulder flexion x 2.5 min, discontinued due to increasing discomfort Seated B shoulder flexion towel slide at table top  - 1 x daily - 7 x weekly - 1 sets - 10  reps Standing R shoulder flexion wall slide with towel x 3 - deferred due to increased pain, although patient reports she was able to do this at home without increased pain Standing R shoulder flexion isometric at wall 5 x 10" Standing R shoulder IR isometric at door frame 5 x 10" Standing R shoulder ER isometric at door frame 5 x 10" Seated scapular retraction 10 x 5" Seated scapular + B shoulder ER 10 x 5" Supine scapular retraction and shoulder extension isometric into mat table 10 x 5" Seated supported elbow flexion x 10 Seated supported forearm pronation/supination 2# db x 10   10/10/22 See pt ed and HEP   PATIENT EDUCATION: Education details: HEP review, HEP update - elbow/forearm exercises, and scapulohumeral kinematics Person educated: Patient Education method: Explanation, Demonstration, and Handouts Education comprehension: verbalized understanding and returned demonstration  HOME EXERCISE PROGRAM: Access Code: [email or web address] URL: [email or web address] Date: 10/13/2022 Prepared by: Glenetta Hew  Exercises - Seated Bilateral Shoulder Flexion Towel Slide at Table Top  - 1 x daily - 7 x weekly - 1 sets - 10 reps - Shoulder Flexion Wall Slide with Towel  - 1 x daily - 7 x weekly - 1 sets - 10 reps - Isometric Shoulder Flexion at Wall  - 1 x daily - 7 x weekly - 1 sets - 5 reps - 10 sec hold - Standing Isometric Shoulder Internal Rotation at Doorway  - 1 x daily - 7 x weekly - 1 sets - 5 reps - 10 sec hold - Standing Isometric Shoulder External Rotation with Doorway (Mirrored)  - 1 x daily - 7 x weekly - 1 sets - 5 reps - 10 sec  hold - Supine Scapular Retraction  - 1 x daily - 7 x weekly - 1 sets - 5 reps - 3-5 sec hold - Seated Scapular Retraction  - 1 x daily - 7 x weekly - 1-3 sets - 10 reps - 2-3 sec hold - Seated Scapular Retraction with External Rotation  - 1 x daily - 7 x weekly - 1 sets - 10 reps - Supported Elbow Flexion Extension AROM  - 1 x daily - 7 x  weekly - 2 sets - 10 reps - 3 sec hold - Forearm Pronation with Dumbbell  - 1 x daily - 7 x weekly - 2 sets - 10 reps - 3 sec hold   ASSESSMENT:  CLINICAL IMPRESSION: Kele reports she has been working on her HEP but would like to review it for today for clarification.  Pulleys utilized for warm up with instructions to avoid painful ROM, initially with good tolerance but increasing discomfort noted therefore discontinued.  Reviewed initial HEP with good return demonstration other than minor cues to avoid shoulder shrug with some exercises.  Poor tolerance for wall slides with towel during HEP review, however she notes no difficulty when she has attempted this at home.  Education provided explaining coordination of movement between scapula and glenohumeral joint and importance of scapular stabilization for proper shoulder motion.  Introduced distal R UE ROM/strengthening with HEP updated accordingly.  [place] will benefit from continued skilled PT to address her posture, ROM and  strength deficits to improve mobility and activity tolerance with decreased pain interference.   OBJECTIVE IMPAIRMENTS: decreased ROM, decreased strength, increased muscle spasms, impaired flexibility, impaired UE functional use, postural dysfunction, and pain.   ACTIVITY LIMITATIONS: carrying, lifting, sleeping, bathing, toileting, dressing, self feeding, reach over head, and hygiene/grooming  PARTICIPATION LIMITATIONS: meal prep, cleaning, laundry, driving, and yard work  PERSONAL FACTORS: Time since onset of injury/illness/exacerbation and 3+ comorbidities: Myasthenia gravis, h/o radiation for mediastinal mass, HTN, OP  are also affecting patient's functional outcome.   REHAB POTENTIAL: Good  CLINICAL DECISION MAKING: Evolving/moderate complexity  EVALUATION COMPLEXITY: Moderate   GOALS: Goals reviewed with patient? Yes  SHORT TERM GOALS: Target date: 11/07/2022   Patient will be independent with initial HEP.   Baseline:  Goal status: IN PROGRESS  2.  Patient able to sleep without waking from shoulder pain  Baseline:  Goal status: IN PROGRESS  LONG TERM GOALS: Target date: 12/05/2022   Patient will be independent with advanced/ongoing HEP to improve outcomes and carryover.  Baseline:  Goal status: IN PROGRESS  2.  Patient will report 75% improvement in R shoulder pain to improve QOL.  Baseline:  Goal status: IN PROGRESS  3. Improved R grip strength by 5-10#  Baseline:  Goal status: IN PROGRESS  4.  Patient to improve R shoulder AROM to [facility] without pain provocation to allow for increased ease of ADLs.  Baseline:  Goal status: IN PROGRESS  5.  Patient will demonstrate improved functional R UE strength as demonstrated her ability to use her arm to perform grooming and hygiene. Baseline:  Goal status: IN PROGRESS  6  Patient will report 78/100 on Quick Dash to demonstrate improved functional ability.  Baseline: 68.2 / 100 = 68.2 % Goal status: IN PROGRESS   PLAN:  PT FREQUENCY: 2x/week  PT DURATION: 8 weeks  PLANNED INTERVENTIONS: Therapeutic exercises, Therapeutic activity, Neuromuscular re-education, Patient/Family education, Self Care, Joint mobilization, Aquatic Therapy, Dry Needling, Electrical stimulation, Spinal mobilization, Cryotherapy, Moist heat, Taping, Ionotophoresis 4mg /ml Dexamethasone, and Manual therapy  PLAN FOR NEXT SESSION: Progress R elbow, forearm and shoulder/RC strength and shoulder ROM; Scapular stabilization; Review and update HEP as needed   Solon Palm, PT  10/13/2022, 5:23 PM

## 2022-10-17 ENCOUNTER — Encounter: Payer: Self-pay | Admitting: Hematology & Oncology

## 2022-10-17 ENCOUNTER — Encounter: Payer: Self-pay | Admitting: Family Medicine

## 2022-10-17 ENCOUNTER — Telehealth: Payer: Self-pay | Admitting: Family Medicine

## 2022-10-17 DIAGNOSIS — N321 Vesicointestinal fistula: Secondary | ICD-10-CM | POA: Diagnosis not present

## 2022-10-17 NOTE — Telephone Encounter (Signed)
Pt called and stated that she needs an urgent referral to [facility] for a colonoscopy with Dr. Romie Levee. She said its urgent and she needs to know by today. Please advise pt.

## 2022-10-17 NOTE — Telephone Encounter (Signed)
Mychart messages and OV notes from dr Maisie Fus have been Fwd to Dr Patsy Lager.

## 2022-10-21 ENCOUNTER — Encounter (HOSPITAL_BASED_OUTPATIENT_CLINIC_OR_DEPARTMENT_OTHER): Payer: Self-pay

## 2022-10-21 ENCOUNTER — Inpatient Hospital Stay: Payer: Medicare HMO | Admitting: Hematology & Oncology

## 2022-10-21 ENCOUNTER — Encounter: Payer: Self-pay | Admitting: Gastroenterology

## 2022-10-21 ENCOUNTER — Other Ambulatory Visit: Payer: Medicare HMO

## 2022-10-21 ENCOUNTER — Ambulatory Visit (HOSPITAL_BASED_OUTPATIENT_CLINIC_OR_DEPARTMENT_OTHER)
Admission: RE | Admit: 2022-10-21 | Discharge: 2022-10-21 | Disposition: A | Discharge status: Home / Self Care | Payer: Medicare HMO | Source: Ambulatory Visit | Attending: Hematology & Oncology | Admitting: Hematology & Oncology

## 2022-10-21 ENCOUNTER — Other Ambulatory Visit: Payer: Self-pay

## 2022-10-21 ENCOUNTER — Telehealth: Payer: Self-pay | Admitting: Gastroenterology

## 2022-10-21 ENCOUNTER — Ambulatory Visit: Payer: Medicare HMO | Admitting: Hematology & Oncology

## 2022-10-21 ENCOUNTER — Other Ambulatory Visit: Payer: Self-pay | Admitting: *Deleted

## 2022-10-21 ENCOUNTER — Encounter: Payer: Self-pay | Admitting: Family Medicine

## 2022-10-21 ENCOUNTER — Inpatient Hospital Stay: Payer: Medicare HMO | Attending: Hematology & Oncology

## 2022-10-21 ENCOUNTER — Encounter: Payer: Self-pay | Admitting: Hematology & Oncology

## 2022-10-21 VITALS — BP 150/85 | HR 88 | Temp 97.9°F | Resp 18 | Ht <= 58 in | Wt 115.0 lb

## 2022-10-21 DIAGNOSIS — C37 Malignant neoplasm of thymus: Secondary | ICD-10-CM | POA: Insufficient documentation

## 2022-10-21 DIAGNOSIS — Z86000 Personal history of in-situ neoplasm of breast: Secondary | ICD-10-CM | POA: Insufficient documentation

## 2022-10-21 DIAGNOSIS — I10 Essential (primary) hypertension: Secondary | ICD-10-CM | POA: Diagnosis not present

## 2022-10-21 DIAGNOSIS — I2699 Other pulmonary embolism without acute cor pulmonale: Secondary | ICD-10-CM | POA: Diagnosis not present

## 2022-10-21 DIAGNOSIS — M8000XA Age-related osteoporosis with current pathological fracture, unspecified site, initial encounter for fracture: Secondary | ICD-10-CM | POA: Diagnosis not present

## 2022-10-21 DIAGNOSIS — N321 Vesicointestinal fistula: Secondary | ICD-10-CM | POA: Diagnosis not present

## 2022-10-21 DIAGNOSIS — K449 Diaphragmatic hernia without obstruction or gangrene: Secondary | ICD-10-CM | POA: Diagnosis not present

## 2022-10-21 DIAGNOSIS — I700 Atherosclerosis of aorta: Secondary | ICD-10-CM | POA: Diagnosis not present

## 2022-10-21 LAB — CBC WITH DIFFERENTIAL (CANCER CENTER ONLY)
Abs Immature Granulocytes: 0.02 10*3/uL (ref 0.00–0.07)
Basophils Absolute: 0 10*3/uL (ref 0.0–0.1)
Basophils Relative: 1 %
Eosinophils Absolute: 0.1 10*3/uL (ref 0.0–0.5)
Eosinophils Relative: 1 %
HCT: 39.5 % (ref 36.0–46.0)
Hemoglobin: 12.6 g/dL (ref 12.0–15.0)
Immature Granulocytes: 0 %
Lymphocytes Relative: 12 %
Lymphs Abs: 1 10*3/uL (ref 0.7–4.0)
MCH: 32.4 pg (ref 26.0–34.0)
MCHC: 31.9 g/dL (ref 30.0–36.0)
MCV: 101.5 fL — ABNORMAL HIGH (ref 80.0–100.0)
Monocytes Absolute: 1.2 10*3/uL — ABNORMAL HIGH (ref 0.1–1.0)
Monocytes Relative: 13 %
Neutro Abs: 6.4 10*3/uL (ref 1.7–7.7)
Neutrophils Relative %: 73 %
Platelet Count: 215 10*3/uL (ref 150–400)
RBC: 3.89 MIL/uL (ref 3.87–5.11)
RDW: 13.6 % (ref 11.5–15.5)
WBC Count: 8.8 10*3/uL (ref 4.0–10.5)
nRBC: 0 % (ref 0.0–0.2)

## 2022-10-21 LAB — CEA (ACCESS): CEA (CHCC): 1.84 ng/mL (ref 0.00–5.00)

## 2022-10-21 LAB — CMP (CANCER CENTER ONLY)
ALT: 20 U/L (ref 0–44)
AST: 23 U/L (ref 15–41)
Albumin: 3.8 g/dL (ref 3.5–5.0)
Alkaline Phosphatase: 63 U/L (ref 38–126)
Anion gap: 9 (ref 5–15)
BUN: 20 mg/dL (ref 8–23)
CO2: 26 mmol/L (ref 22–32)
Calcium: 9.6 mg/dL (ref 8.9–10.3)
Chloride: 109 mmol/L (ref 98–111)
Creatinine: 0.82 mg/dL (ref 0.44–1.00)
GFR, Estimated: >60 mL/min (ref >60)
Glucose, Bld: 106 mg/dL — ABNORMAL HIGH (ref 70–99)
Potassium: 4.4 mmol/L (ref 3.5–5.1)
Sodium: 144 mmol/L (ref 135–145)
Total Bilirubin: 0.4 mg/dL (ref 0.3–1.2)
Total Protein: 6.9 g/dL (ref 6.5–8.1)

## 2022-10-21 MED ORDER — APIXABAN 5 MG PO TABS: 5.0000 mg | ORAL_TABLET | Freq: Two times a day (BID) | ORAL | 1 refills | Status: DC

## 2022-10-21 MED ORDER — IOHEXOL 300 MG/ML  SOLN
100.0000 mL | Freq: Once | INTRAMUSCULAR | Status: AC | PRN
  Administered 2022-10-21: 75 mL via INTRAVENOUS

## 2022-10-21 NOTE — Telephone Encounter (Signed)
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  Please advise scheudling and urgency.  Thanks  Dr. Adela Lank DOD 8/2/24AM

## 2022-10-21 NOTE — Telephone Encounter (Signed)
Appt rescheduled to 10/24/22 at 3:40 with Dr. Leonides Schanz. Please notify patient. Thanks

## 2022-10-21 NOTE — Addendum Note (Signed)
Addended by: Pearline Cables on: 10/21/2022 05:33 PM   Modules accepted: Orders

## 2022-10-21 NOTE — Progress Notes (Signed)
Hematology and Oncology Follow Up Visit  Faith Massey [id number] 06-15-1945 77 y.o. 10/21/2022  Past Medical History:  Diagnosis Date   Breast CA [facility])    History of radiation therapy    Chest 06/09/2022 - 07/21/2022 - Dr. 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  Unfortunately, she now has a new problem.  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  She had noted some gas/air when she was urinating.  As always, Dr. Dallas Schimke was very thorough.  She had a CT scan of the abdomen pelvis.  This did seem to show a fistulous type connection.  Faith Massey has seen Dr. Maisie Fus of surgery.  She saw Dr. Maisie Fus on 10/17/2022.  Dr. Maisie Fus wish to have a colonoscopy.  I will have to set this up for her.  Dr. Maisie Fus also recommended that maybe surgery would not need to be done and that this fistula will close itself off on its own.  We did do a CEA level on Faith Massey.  This was done today.  The level was 1.84.  She has had no issues with fever.  She has had no bleeding.  She has had no nausea or vomiting.  Of note, when she had a CAT scan done, the report of the CAT scan also showed some splenic lesions.  Thankfully, she has had no problems with thymoma.  She had a CT of the chest today.  There is no evidence of recurrent thymoma.  She is on Eliquis for pulmonary embolism.  She is doing well on the Eliquis.  Her CT scan that was done back in May did not show any residual pulmonary embolism.  She is losing some hair.  She does feel tired.  I am not sure as to why this would be the case.  She has been on a lot of prednisone.  She now is off  prednisone.  She has been on prednisone for about a month or so.  Overall, I would have said that her performance status is probably ECOG 1.     Wt Readings from Last 3 Encounters:  10/21/22 115 lb (52.2 kg)  09/14/22 117 lb (53.1 kg)  09/12/22 116 lb (52.6 kg)     Medications:   Current Outpatient Medications:    aspirin EC 81 MG tablet, Take 1 tablet (81 mg total) by mouth daily. Swallow whole., Disp: 30 tablet, Rfl: 12   Cholecalciferol (VITAMIN D-3) 5000 UNITS TABS, Take 5,000 Units by mouth daily., Disp: , Rfl:    COLLAGEN PO, Take 1 Scoop by mouth daily., Disp: , Rfl:    ezetimibe (ZETIA) 10 MG tablet, Take 1 tablet (10 mg total) by mouth daily., Disp: 30 tablet, Rfl: 3   lisinopril (ZESTRIL) 40 MG tablet, Take 1 tablet (40 mg total) by mouth daily., Disp: 90 tablet, Rfl: 3   Multiple Vitamins-Minerals (MULTIVITAMIN WITH MINERALS) tablet, Take 1 tablet by mouth daily., Disp: , Rfl:    nystatin (MYCOSTATIN) 100000 UNIT/ML suspension, Take 5 mLs (500,000 Units total) by mouth 4 (four) times daily., Disp: 500 mL, Rfl: 1   pyridostigmine (MESTINON) 60  MG tablet, Take 1 tablet (60 mg total) by mouth 3 (three) times daily., Disp: 90 tablet, Rfl: 6   traMADol (ULTRAM) 50 MG tablet, Take 1 tablet (50 mg total) by mouth every 12 (twelve) hours as needed., Disp: 30 tablet, Rfl: 0   apixaban (ELIQUIS) 5 MG TABS tablet, Take 1 tablet (5 mg total) by mouth 2 (two) times daily., Disp: 60 tablet, Rfl: 2  Allergies:  Allergies  Allergen Reactions   Prednisone Shortness Of Breath, Swelling, Palpitations, Dermatitis and Hypertension    Patient reports having an intolerance to medication. Reports medication caused swelling and metallic taste in mouth.    Past Medical History, Surgical history, Social history, and Family History were reviewed and updated.  Review of Systems: Review of Systems  Constitutional: Negative.   HENT:  Negative.    Eyes: Negative.   Respiratory: Negative.     Cardiovascular: Negative.   Gastrointestinal: Negative.   Endocrine: Negative.   Genitourinary: Negative.    Musculoskeletal: Negative.   Skin: Negative.   Neurological: Negative.   Hematological: Negative.   Psychiatric/Behavioral: Negative.      As stated above in HPI  Physical Exam:  height is 4\' 10"  (1.473 m) and weight is 115 lb (52.2 kg). Her oral temperature is 97.9 F (36.6 C). Her blood pressure is 150/85 (abnormal) and her pulse is 88. Her respiration is 18 and oxygen saturation is 100%.   Physical Exam Vitals reviewed.  HENT:     Head: Normocephalic and atraumatic.  Eyes:     Pupils: Pupils are equal, round, and reactive to light.  Cardiovascular:     Rate and Rhythm: Normal rate and regular rhythm.     Heart sounds: Normal heart sounds.  Pulmonary:     Effort: Pulmonary effort is normal.     Breath sounds: Normal breath sounds.  Abdominal:     General: Bowel sounds are normal.     Palpations: Abdomen is soft.  Musculoskeletal:        General: No tenderness or deformity. Normal range of motion.     Cervical back: Normal range of motion.  Lymphadenopathy:     Cervical: No cervical adenopathy.  Skin:    General: Skin is warm and dry.     Findings: No erythema or rash.  Neurological:     Mental Status: She is alert and oriented to person, place, and time.  Psychiatric:        Behavior: Behavior normal.        Thought Content: Thought content normal.        Judgment: Judgment normal.    Lab Results  Component Value Date   WBC 8.8 10/21/2022   HGB 12.6 10/21/2022   HCT 39.5 10/21/2022   MCV 101.5 (H) 10/21/2022   PLT 215 10/21/2022     Chemistry      Component Value Date/Time   NA 144 10/21/2022 1045   NA 142 09/12/2022 1204   K 4.4 10/21/2022 1045   CL 109 10/21/2022 1045   CO2 26 10/21/2022 1045   BUN 20 10/21/2022 1045   BUN 17 09/12/2022 1204   CREATININE 0.82 10/21/2022 1045   CREATININE 0.61 05/06/2013 1003      Component Value  Date/Time   CALCIUM 9.6 10/21/2022 1045   ALKPHOS 63 10/21/2022 1045   AST 23 10/21/2022 1045   ALT 20 10/21/2022 1045   BILITOT 0.4 10/21/2022 1045      Assessment and Plan-   Faith Massey is a  very charming 77 year old white female.  She is followed up for her thymoma and for the pulmonary embolism.  Everything looks fantastic from my point of view.  Again, the real issue now is this fistula.  Again, I would have to think that she is going to need to have this fixed.  However, I know that Dr. Maisie Fus has a lot of experience.  I would keep her on the Eliquis for right now.  I really would not change the Eliquis dose until we know what is going on with the fistula.  Again, we are thankful that the thymoma is not a problem.  I would like to see her back in September.  By then, with we will have a much better idea as to what is going on with the fistula.

## 2022-10-23 ENCOUNTER — Encounter: Payer: Self-pay | Admitting: Family Medicine

## 2022-10-23 ENCOUNTER — Encounter: Payer: Self-pay | Admitting: Hematology & Oncology

## 2022-10-23 NOTE — Progress Notes (Unsigned)
[facility] [address] [place], [place] [address] [phone]  Date:  10/26/2022   Name:  Faith Massey   DOB:  June 21, 1945   MRN:  [id number]  PCP:  Pearline Cables, MD    Chief Complaint: No chief complaint on file.   History of Present Illness:  Faith Massey is a 77 y.o. very pleasant female patient who presents with the following:  Patient seen today with a skin concern Most recent visit with myself was in June-at that time she had recently noted erythema to come out of her bladder, we get a CT scan and found a fistula between the colon and bladder She is in the process of having this evaluated through both general surgery and GI- She was seen by hematology on August 2 to follow-up history of thymoma and pulmonary embolism She is on Eliquis  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   S/P Robotic Assisted Right Video Thoracoscopy with resection of Thymus 04/25/2022   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 Antony Blackbird   Hyperlipidemia    Hypertension    Myasthenia gravis (HCC)    Dx'd 03/2022   Osteoporosis 10/12/2012    Past Surgical History:  Procedure Laterality Date   ABDOMINAL HYSTERECTOMY  03/22/2003   BACK SURGERY  03/21/1998   BREAST SURGERY     MASTECTOMY Bilateral 03/21/1993    Social History   Tobacco Use   Smoking status: Never   Smokeless tobacco: Never  Vaping Use   Vaping status: Never Used  Substance Use Topics   Alcohol use:  Yes    Alcohol/week: 1.0 standard drink of alcohol    Types: 1 Glasses of wine per week    Comment: occasional   Drug use: No    Family History  Problem Relation Age of Onset   Stroke Mother    Hypertension Mother    Myasthenia gravis Mother    Hypertension Father     Allergies  Allergen Reactions   Prednisone Shortness Of Breath, Swelling, Palpitations, Dermatitis and Hypertension    Patient reports having an intolerance to medication. Reports medication caused swelling and metallic taste in mouth.    Medication list has been reviewed and updated.  Current Outpatient Medications on File Prior to Visit  Medication Sig Dispense Refill   apixaban (ELIQUIS) 5 MG TABS tablet Take 1 tablet (5 mg total) by mouth 2 (two) times daily. 180 tablet 1   aspirin EC 81 MG tablet Take 1 tablet (81 mg total) by mouth daily. Swallow whole. 30 tablet 12   Cholecalciferol (VITAMIN D-3) 5000 UNITS TABS Take 5,000 Units by mouth daily.     COLLAGEN PO Take 1 Scoop by mouth daily.     ezetimibe (ZETIA) 10 MG tablet Take 1 tablet (10 mg total) by mouth daily. 30 tablet 3   lisinopril (ZESTRIL) 40 MG tablet Take 1 tablet (40 mg total) by mouth daily. 90 tablet 3  Multiple Vitamins-Minerals (MULTIVITAMIN WITH MINERALS) tablet Take 1 tablet by mouth daily.     nystatin (MYCOSTATIN) 100000 UNIT/ML suspension Take 5 mLs (500,000 Units total) by mouth 4 (four) times daily. 500 mL 1   pyridostigmine (MESTINON) 60 MG tablet Take 1 tablet (60 mg total) by mouth 3 (three) times daily. 90 tablet 6   traMADol (ULTRAM) 50 MG tablet Take 1 tablet (50 mg total) by mouth every 12 (twelve) hours as needed. 30 tablet 0   No current facility-administered medications on file prior to visit.    Review of Systems:  ***  Physical Examination: There were no vitals filed for this visit. There were no vitals filed for this visit. There is no height or weight on file to calculate BMI. Ideal Body Weight:     ***  Assessment and Plan: ***  Signed Abbe Amsterdam, MD

## 2022-10-24 ENCOUNTER — Ambulatory Visit: Payer: Medicare HMO | Admitting: Hematology & Oncology

## 2022-10-24 ENCOUNTER — Telehealth: Payer: Self-pay

## 2022-10-24 ENCOUNTER — Inpatient Hospital Stay: Payer: Medicare HMO

## 2022-10-24 ENCOUNTER — Encounter: Payer: Self-pay | Admitting: Internal Medicine

## 2022-10-24 ENCOUNTER — Ambulatory Visit: Payer: Medicare HMO | Admitting: Internal Medicine

## 2022-10-24 VITALS — BP 120/66 | HR 94 | Ht <= 58 in | Wt 115.2 lb

## 2022-10-24 DIAGNOSIS — K5730 Diverticulosis of large intestine without perforation or abscess without bleeding: Secondary | ICD-10-CM | POA: Diagnosis not present

## 2022-10-24 DIAGNOSIS — N321 Vesicointestinal fistula: Secondary | ICD-10-CM | POA: Diagnosis not present

## 2022-10-24 MED ORDER — NA SULFATE-K SULFATE-MG SULF 17.5-3.13-1.6 GM/177ML PO SOLN: ORAL | 0 refills | Status: DC

## 2022-10-24 NOTE — Progress Notes (Signed)
Chief Complaint: Colovesicular fistula  HPI : 77 year old female with history of breast cancer s/p bilateral mastectomy/XRT, PE 05/2022 on Eliquis, CVA, thymoma s/p resection, myasthenia gravis, HFpEF, and osteoporosis presents to discuss a colonoscopy procedure for colovesicular fistula  She was diagnosed with a colovesicular fistula after she was noted to be passing gas from her bladder. CT A/P showed a colovesicular fistula in 09/2022. She saw Dr. Maisie Fus with surgery who recommended a colonoscopy for further evaluation. Last colonoscopy was 10 years ago done in [facility] and was normal. Denies hematochezia and melena. Endorses bloating. She had 1-3 BMs per day on average. Denies constipation. Stools appear normal. Endorses some gas pains that improve after having a BM. When she urinates, she will see gas coming from her bladder. Denies N&V. Endorses dysphagia related to myasthenia gravis where she has to be careful with chewing and then eating. When she was on prednisone, she did have chest pain/burning. This chest pain/burning had improved significantly since she was taken off of the prednisone. She had a cardiac work up that was negative for any heart issues. Grandfather had hiatal hernia and esophageal dilation done in the past. Two of her sons have to be careful with eating. Denies family history of colon cancer or IBD. She takes Eliquis regularly. Occasional aspirin use of 325 mg every day.  Wt Readings from Last 3 Encounters:  10/24/22 115 lb 4 oz (52.3 kg)  10/21/22 115 lb (52.2 kg)  09/14/22 117 lb (53.1 kg)   Past Medical History:  Diagnosis Date   Breast CA (HCC)    History of radiation therapy    Chest 06/09/2022 - 07/21/2022 - Dr. Antony Blackbird   Hyperlipidemia    Hypertension    Myasthenia gravis (HCC)    Dx'd 03/2022   Osteoporosis 10/12/2012    Past Surgical History:  Procedure Laterality Date   ABDOMINAL HYSTERECTOMY  03/22/2003   BACK SURGERY  03/21/1998   BREAST SURGERY      MASTECTOMY Bilateral 03/21/1993   Family History  Problem Relation Age of Onset   Stroke Mother    Hypertension Mother    Myasthenia gravis Mother    Hypertension Father    Colon cancer Neg Hx    Esophageal cancer Neg Hx    Social History   Tobacco Use   Smoking status: Never   Smokeless tobacco: Never  Vaping Use   Vaping status: Never Used  Substance Use Topics   Alcohol use: Not Currently    Alcohol/week: 1.0 standard drink of alcohol    Types: 1 Glasses of wine per week    Comment: occasional   Drug use: No   Current Outpatient Medications  Medication Sig Dispense Refill   apixaban (ELIQUIS) 5 MG TABS tablet Take 1 tablet (5 mg total) by mouth 2 (two) times daily. 180 tablet 1   aspirin EC 81 MG tablet Take 1 tablet (81 mg total) by mouth daily. Swallow whole. 30 tablet 12   Cholecalciferol (VITAMIN D-3) 5000 UNITS TABS Take 5,000 Units by mouth daily.     COLLAGEN PO Take 1 Scoop by mouth daily.     lisinopril (ZESTRIL) 40 MG tablet Take 1 tablet (40 mg total) by mouth daily. 90 tablet 3   Multiple Vitamins-Minerals (MULTIVITAMIN WITH MINERALS) tablet Take 1 tablet by mouth daily.     nystatin (MYCOSTATIN) 100000 UNIT/ML suspension Take 5 mLs (500,000 Units total) by mouth 4 (four) times daily. (Patient taking differently: Take 5 mLs by  mouth as needed.) 500 mL 1   pyridostigmine (MESTINON) 60 MG tablet Take 1 tablet (60 mg total) by mouth 3 (three) times daily. 90 tablet 6   traMADol (ULTRAM) 50 MG tablet Take 1 tablet (50 mg total) by mouth every 12 (twelve) hours as needed. 30 tablet 0   ezetimibe (ZETIA) 10 MG tablet Take 1 tablet (10 mg total) by mouth daily. (Patient not taking: Reported on 10/24/2022) 30 tablet 3   No current facility-administered medications for this visit.   Allergies  Allergen Reactions   Prednisone Shortness Of Breath, Swelling, Palpitations, Dermatitis and Hypertension    Patient reports having an intolerance to medication. Reports  medication caused swelling and metallic taste in mouth.     Review of Systems: All systems reviewed and negative except where noted in HPI.   Physical Exam: BP 120/66   Pulse 94   Ht 4\' 10"  (1.473 m)   Wt 115 lb 4 oz (52.3 kg)   BMI 24.09 kg/m  Constitutional: Pleasant,well-developed, female in no acute distress. HEENT: Normocephalic and atraumatic. Conjunctivae are normal. No scleral icterus. Cardiovascular: Normal rate, regular rhythm.  Pulmonary/chest: Effort normal and breath sounds normal. No wheezing, rales or rhonchi. Abdominal: Soft, nondistended, nontender. Bowel sounds active throughout. There are no masses palpable. No hepatomegaly. Extremities: No edema Neurological: Alert and oriented to person place and time. Skin: Skin is warm and dry. No rashes noted. Psychiatric: Normal mood and affect. Behavior is normal.  Labs 08/2022: BMP nml.   Labs 10/2022: CBC unremarkable. CMP unremarkable. CEA nml.   CT A/P w/contrast 09/20/22: MPRESSION: 1. Focal irregular thickening of the left posteroinferior urinary bladder wall with small amount of air in the nondependent portion of the urinary bladder. At this location, there is loss of fat planes between asymmetrically prominent left vaginal cuff and mildly thickened proximal sigmoid colon. Findings favor fistulous communication between proximal sigmoid colon and urinary bladder with / without involvement of the left vaginal cuff. This explains patient's reported history of pneumaturia. 2. Multiple subcentimeter hypoattenuating lesions within the spleen, incompletely characterized on the current examination. These are indeterminate and therefore further evaluation with multiphasic MRI abdomen is recommended in 6 months. 3. Multiple other nonacute observations, as described above. Aortic Atherosclerosis (ICD10-I70.0).  Chest CT w/contrast 10/21/22: IMPRESSION: 1. No evidence for recurrent or metastatic disease in the chest. 2. Bandlike  chronic atelectasis or scarring in both lungs. 3. Small hiatal hernia. 4.  Aortic Atherosclerosis (ICD10-I70.0).  ASSESSMENT AND PLAN: Colovesicular fistula Diverticulosis Dysphagia Small hiatal hernia seen on CT imaging Patient presents with a colovesicular fistula that was diagnosed via CT scan on 09/2022.  She has been seen by Dr. Maisie Fus with surgery and is being considered for a partial colectomy for repair of her colovesicular fistula.  A colonoscopy has been requested to rule out underlying malignancy.  Etiologies for her colovesicular fistula could include trauma from prior Foley insertion, diverticulitis, or Crohn's disease.  I went over the colonoscopy procedure in detail with the patient, and she is agreeable to proceeding.  Patient does describe some issues with dysphagia that she attributes to her myasthenia gravis.  If dysphagia were to worsen in the future, could consider EGD for further evaluation. - Colonoscopy [facility]. Will need to hold Eliquis 2 days beforehand.  Eulah Pont, MD  I spent 46 minutes of time, including in depth chart review, independent review of results as outlined above, communicating results with the patient directly, face-to-face time with the patient, coordinating care, ordering studies  and medications as appropriate, and documentation.

## 2022-10-24 NOTE — Patient Instructions (Addendum)
You have been scheduled for a colonoscopy. Please follow written instructions given to you at your visit today.   Please pick up your prep supplies at the pharmacy within the next 1-3 days.  If you use inhalers (even only as needed), please bring them with you on the day of your procedure.  DO NOT TAKE 7 DAYS PRIOR TO TEST- Trulicity (dulaglutide) Ozempic, Wegovy (semaglutide) Mounjaro (tirzepatide) Bydureon Bcise (exanatide extended release)  DO NOT TAKE 1 DAY PRIOR TO YOUR TEST Rybelsus (semaglutide) Adlyxin (lixisenatide) Victoza (liraglutide) Byetta (exanatide)  You will be contacted by our office prior to your procedure for directions on holding your Eliquis.  If you do not hear from our office 1 week prior to your scheduled procedure, please call [phone] to discuss.  We have sent the following medications to your pharmacy for you to pick up at your convenience: Suprep   If your blood pressure at your visit was 140/90 or greater, please contact your primary care physician to follow up on this.  _______________________________________________________  If you are age 25 or older, your body mass index should be between 23-30. Your Body mass index is 24.09 kg/m. If this is out of the aforementioned range listed, please consider follow up with your Primary Care Provider.  If you are age 78 or younger, your body mass index should be between 19-25. Your Body mass index is 24.09 kg/m. If this is out of the aformentioned range listed, please consider follow up with your Primary Care Provider.   ________________________________________________________  The [facility] providers would like to encourage you to use [facility] to communicate with providers for non-urgent requests or questions.  Due to long hold times on the telephone, sending your provider a message by [facility] may be a faster and more efficient way to get a response.  Please allow 48 business hours for a response.  Please  remember that this is for non-urgent requests.  _______________________________________________________   Due to recent changes in healthcare laws, you may see the results of your imaging and laboratory studies on MyChart before your provider has had a chance to review them.  We understand that in some cases there may be results that are confusing or concerning to you. Not all laboratory results come back in the same time frame and the provider may be waiting for multiple results in order to interpret others.  Please give [place] 48 hours in order for your provider to thoroughly review all the results before contacting the office for clarification of your results.   Thank you for entrusting me with your care and for choosing [facility], Dr. Eulah Pont

## 2022-10-24 NOTE — Telephone Encounter (Signed)
[facility]     Request for surgical clearance:     Endoscopy Procedure  What type of surgery is being performed?     Colonoscopy  When is this surgery scheduled?     11/09/22  What type of clearance is required ?   Pharmacy  Are there any medications that need to be held prior to surgery and how long? Eliquis 2 day hold  Practice name and name of physician performing surgery?      [facility]  What is your office phone and fax number?      Phone- [phone]  Fax- [phone]  Anesthesia type (None, local, MAC, general) ?       MAC

## 2022-10-25 ENCOUNTER — Telehealth: Payer: Self-pay | Admitting: *Deleted

## 2022-10-25 NOTE — Telephone Encounter (Signed)
RETURNED PATIENT'S PHONE CALL, SPOKE WITH PATIENT. ?

## 2022-10-26 ENCOUNTER — Encounter: Payer: Self-pay | Admitting: Family Medicine

## 2022-10-26 ENCOUNTER — Telehealth: Payer: Self-pay

## 2022-10-26 ENCOUNTER — Ambulatory Visit: Payer: Medicare HMO | Attending: Physician Assistant

## 2022-10-26 ENCOUNTER — Ambulatory Visit: Payer: Medicare HMO | Admitting: Family Medicine

## 2022-10-26 VITALS — BP 126/60 | HR 65 | Temp 97.6°F | Resp 18 | Ht <= 58 in | Wt 115.2 lb

## 2022-10-26 DIAGNOSIS — M25611 Stiffness of right shoulder, not elsewhere classified: Secondary | ICD-10-CM | POA: Insufficient documentation

## 2022-10-26 DIAGNOSIS — R252 Cramp and spasm: Secondary | ICD-10-CM | POA: Diagnosis not present

## 2022-10-26 DIAGNOSIS — M6281 Muscle weakness (generalized): Secondary | ICD-10-CM | POA: Diagnosis not present

## 2022-10-26 DIAGNOSIS — M25511 Pain in right shoulder: Secondary | ICD-10-CM | POA: Diagnosis not present

## 2022-10-26 DIAGNOSIS — R238 Other skin changes: Secondary | ICD-10-CM | POA: Diagnosis not present

## 2022-10-26 DIAGNOSIS — L659 Nonscarring hair loss, unspecified: Secondary | ICD-10-CM | POA: Diagnosis not present

## 2022-10-26 LAB — FERRITIN: Ferritin: 29.7 ng/mL (ref 10.0–291.0)

## 2022-10-26 MED ORDER — METRONIDAZOLE 1 % EX CREA
TOPICAL_CREAM | Freq: Every day | CUTANEOUS | 0 refills | Status: DC
Start: 2022-10-26 — End: 2023-07-19

## 2022-10-26 NOTE — Therapy (Signed)
OUTPATIENT PHYSICAL THERAPY TREATMENT   Patient Name: Faith Massey MRN: [id number] DOB:[birth year], 77 y.o., female Today's Date: 10/26/2022  END OF SESSION:  PT End of Session - 10/26/22 1319     Visit Number 3    Date for PT Re-Evaluation 12/05/22    Authorization Type [employer] MCR    Progress Note Due on Visit 10    PT Start Time 1315    PT Stop Time 1355    PT Time Calculation (min) 40 min    Activity Tolerance Patient tolerated treatment well    Behavior During Therapy [facility] for tasks assessed/performed               Past Medical History:  Diagnosis Date   Breast CA (HCC)    History of radiation therapy    Chest 06/09/2022 - 07/21/2022 - Dr. 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   Myasthenic syndrome (HCC) 04/12/2022   CVA (cerebral vascular accident) (HCC) 03/28/2022   HTN (hypertension) 03/28/2022   Chest mass 03/28/2022   Right knee injury 02/14/2017   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 10/16/2012   Osteoporosis 10/12/2012    PCP: Pearline Cables, MD  REFERRING PROVIDER: Cristie Hem, PA-C  REFERRING DIAG: [phone] (ICD-10-CM) - Chronic right shoulder pain  THERAPY DIAG:  Acute pain of right shoulder  Stiffness of right shoulder, not elsewhere classified  Muscle weakness (generalized)  Cramp and spasm  RATIONALE FOR EVALUATION AND TREATMENT: Rehabilitation  ONSET DATE: 06/20/22  NEXT MD VISIT: 11/02/22   SUBJECTIVE:                                                                                                                                                                                       SUBJECTIVE STATEMENT: Pt reports having to assist with L UE when raising R.   EVAL:  I had so much pain last night I had to take a Tramadol. I can't get a blouse out of the closet. Hurts to reach out. Can't reach behind head and can't lift arm without support of other arm. A little better since injection last week.  Just recently went off prednisone which she had for IVIG therapy. Diagnosed with Myasthenia gravis in January of this year (associated with the mass). 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 Tore biceps when she was changing the filter on her water filtering system, heard pop. She uses cane intermittently due to balance issues from Myasthenia gravis and prednisone.  Hand dominance: Right  PAIN:  Are you having pain? Yes: NPRS scale: 0 at rest /10 Pain location: anterior shoulder now Pain description: stabbing Aggravating factors: reaching forward, trying to lift Relieving factors: Tramadol  PERTINENT HISTORY: MG, h/o radiation (March and May 2024) for mediastinal mass, HTN, OP  PRECAUTIONS: None  RED FLAGS: None   WEIGHT BEARING RESTRICTIONS: No  FALLS:  Has patient fallen in last 6 months? No  LIVING ENVIRONMENT: Lives with: lives alone Lives in: House/apartment Stairs: No Has following equipment at home: Single point cane  OCCUPATION: retired  PLOF: Independent with household mobility with device  PATIENT GOALS:full function of her R arm    OBJECTIVE:   DIAGNOSTIC FINDINGS:  [place]: Limited musculoskeletal ultrasound of the right upper extremity, right  shoulder was performed.  Short axis evaluation of the bicipital groove  shows no cortical irregularity of the humeral head or groove of the  shoulder.  Within the bicipital groove there is a hypoechoic space with  absent visualization of the proximal head  of the biceps tendon, indicative  of likely proximal bicep tendon tear.  Short and long axis evaluation of  the biceps tendon does show retraction into the proximal aspect of the  bicep musculature.  Evaluation of the [facility] joint shows at least moderate  arthritic changes without significant bursal distention.   PATIENT SURVEYS:  Quick Dash 68.2 / 100 = 68.2 %  COGNITION: Overall cognitive status: Within functional limits for tasks assessed     SENSATION: WFL  POSTURE: R shoulder anterior, depressed R shoulder, scapula and R ilia  UPPER EXTREMITY ROM:   A/PROM Right eval Left eval  Shoulder flexion 20/157 passively with clunk   Shoulder extension WNL   Shoulder abduction 58/177   Shoulder adduction    Shoulder internal rotation 55/62   Shoulder external rotation 65/85 very uncomfortable   Elbow flexion full   Elbow extension full   (Blank rows = not tested)  UPPER EXTREMITY MMT:  MMT Right eval Left eval  Shoulder flexion 2-   Shoulder extension 4+   Shoulder abduction 4-* with elbow bent   Shoulder adduction    Shoulder internal rotation 4-   Shoulder external rotation 5   Middle trapezius    Lower trapezius    Elbow flexion 4*   Elbow extension 5 mild pain    Wrist pronation    Wrist supination    Grip strength (lbs) 10 12  (Blank rows = not tested)   PALPATION:  Marked UT, pecs, ant shoulder, IS  Decreased stability in ant R shoulder joint   TODAY'S TREATMENT:  DATE:  10/26/22 THERAPEUTIC EXERCISE: to improve flexibility, strength and mobility.  Demonstration, verbal and tactile cues throughout for technique. Pulleys - R shoulder flexion x 1 min, rest, another minute after resting Shoulder rolls 10x2" Table slides into flexion x12 bil Table slides into scaption x12 bil Prayer slides up the wall into flexion AAROM x  10 Isometric R shoulder flexion,ER,IR 3x10" each pushing into wall Reviewed HEP, consolidated, and adjusted the flow of exercises for best results  10/13/22 THERAPEUTIC EXERCISE: to improve flexibility, strength and mobility.  Demonstration, verbal and tactile cues throughout for technique. Pulleys - R shoulder flexion x 2.5 min, discontinued due to increasing discomfort Seated B shoulder flexion towel slide at table top  - 1 x daily - 7 x weekly - 1 sets - 10 reps Standing R shoulder flexion wall slide with towel x 3 - deferred due to increased pain, although patient reports she was able to do this at home without increased pain Standing R shoulder flexion isometric at wall 5 x 10" Standing R shoulder IR isometric at door frame 5 x 10" Standing R shoulder ER isometric at door frame 5 x 10" Seated scapular retraction 10 x 5" Seated scapular + B shoulder ER 10 x 5" Supine scapular retraction and shoulder extension isometric into mat table 10 x 5" Seated supported elbow flexion x 10 Seated supported forearm pronation/supination 2# db x 10   10/10/22 See pt ed and HEP   PATIENT EDUCATION: Education details: HEP review, HEP update - elbow/forearm exercises, and scapulohumeral kinematics Person educated: Patient Education method: Explanation, Demonstration, and Handouts Education comprehension: verbalized understanding and returned demonstration  HOME EXERCISE PROGRAM: Access Code: [email or web address] URL: [email or web address] Date: 10/13/2022 Prepared by: Glenetta Hew  Exercises - Seated Bilateral Shoulder Flexion Towel Slide at Table Top  - 1 x daily - 7 x weekly - 1 sets - 10 reps - Shoulder Flexion Wall Slide with Towel  - 1 x daily - 7 x weekly - 1 sets - 10 reps - Isometric Shoulder Flexion at Wall  - 1 x daily - 7 x weekly - 1 sets - 5 reps - 10 sec hold - Standing Isometric Shoulder Internal Rotation at Doorway  - 1 x daily - 7 x weekly - 1 sets - 5 reps - 10 sec hold -  Standing Isometric Shoulder External Rotation with Doorway (Mirrored)  - 1 x daily - 7 x weekly - 1 sets - 5 reps - 10 sec  hold - Supine Scapular Retraction  - 1 x daily - 7 x weekly - 1 sets - 5 reps - 3-5 sec hold - Seated Scapular Retraction  - 1 x daily - 7 x weekly - 1-3 sets - 10 reps - 2-3 sec hold - Seated Scapular Retraction with External Rotation  - 1 x daily - 7 x weekly - 1 sets - 10 reps - Supported Elbow Flexion Extension AROM  - 1 x daily - 7 x weekly - 2 sets - 10 reps - 3 sec hold - Forearm Pronation with Dumbbell  - 1 x daily - 7 x weekly - 2 sets - 10 reps - 3 sec hold   ASSESSMENT:  CLINICAL IMPRESSION: Pt tolerated the progression of exercises well. We added scaption table slides and prayer slides up the wall to HEP. Slightly increased muscular effort with isometrics as well. Consolidated and adjusted the flow of her HEP to the best results. She has minimal pain with assisting RUE to reach up but  a lot of pain with only using the RUE. She continues to benefit from skilled therapy.  Nasha will benefit from continued skilled PT to address her posture, ROM and strength deficits to improve mobility and activity tolerance with decreased pain interference.   OBJECTIVE IMPAIRMENTS: decreased ROM, decreased strength, increased muscle spasms, impaired flexibility, impaired UE functional use, postural dysfunction, and pain.   ACTIVITY LIMITATIONS: carrying, lifting, sleeping, bathing, toileting, dressing, self feeding, reach over head, and hygiene/grooming  PARTICIPATION LIMITATIONS: meal prep, cleaning, laundry, driving, and yard work  PERSONAL FACTORS: Time since onset of injury/illness/exacerbation and 3+ comorbidities: Myasthenia gravis, h/o radiation for mediastinal mass, HTN, OP  are also affecting patient's functional outcome.   REHAB POTENTIAL: Good  CLINICAL DECISION MAKING: Evolving/moderate complexity  EVALUATION COMPLEXITY: Moderate   GOALS: Goals reviewed with  patient? Yes  SHORT TERM GOALS: Target date: 11/07/2022   Patient will be independent with initial HEP.  Baseline:  Goal status: MET- 10/26/22  2.  Patient able to sleep without waking from shoulder pain  Baseline:  Goal status: IN PROGRESS  LONG TERM GOALS: Target date: 12/05/2022   Patient will be independent with advanced/ongoing HEP to improve outcomes and carryover.  Baseline:  Goal status: IN PROGRESS  2.  Patient will report 75% improvement in R shoulder pain to improve QOL.  Baseline:  Goal status: IN PROGRESS  3. Improved R grip strength by 5-10#  Baseline:  Goal status: IN PROGRESS  4.  Patient to improve R shoulder AROM to [facility] without pain provocation to allow for increased ease of ADLs.  Baseline:  Goal status: IN PROGRESS  5.  Patient will demonstrate improved functional R UE strength as demonstrated her ability to use her arm to perform grooming and hygiene. Baseline:  Goal status: IN PROGRESS  6  Patient will report 78/100 on Quick Dash to demonstrate improved functional ability.  Baseline: 68.2 / 100 = 68.2 % Goal status: IN PROGRESS   PLAN:  PT FREQUENCY: 2x/week  PT DURATION: 8 weeks  PLANNED INTERVENTIONS: Therapeutic exercises, Therapeutic activity, Neuromuscular re-education, Patient/Family education, Self Care, Joint mobilization, Aquatic Therapy, Dry Needling, Electrical stimulation, Spinal mobilization, Cryotherapy, Moist heat, Taping, Ionotophoresis 4mg /ml Dexamethasone, and Manual therapy  PLAN FOR NEXT SESSION: Progress R elbow, forearm and shoulder/RC strength and shoulder ROM; Scapular stabilization; Review and update HEP as needed   Darleene Cleaver, PTA  10/26/2022, 1:57 PM

## 2022-10-26 NOTE — Telephone Encounter (Signed)
Received message from Dr Patsy Lager okay to hold eliquis 2 days prior for procedure patient should restart the evening of her colonoscopy

## 2022-10-26 NOTE — Patient Instructions (Signed)
Will check your iron today to make sure this is not why you are losing hair Suspect hair loss may be due to stress- or aging  An OTC Rogaine type topical treatment can help!   Metronidazole gel for your face as needed

## 2022-10-27 ENCOUNTER — Ambulatory Visit: Payer: Medicare HMO | Admitting: Radiation Oncology

## 2022-10-27 DIAGNOSIS — G7001 Myasthenia gravis with (acute) exacerbation: Secondary | ICD-10-CM | POA: Diagnosis not present

## 2022-10-28 ENCOUNTER — Encounter: Payer: Self-pay | Admitting: Internal Medicine

## 2022-10-28 ENCOUNTER — Ambulatory Visit: Payer: Medicare HMO | Admitting: Physical Therapy

## 2022-10-28 DIAGNOSIS — M25511 Pain in right shoulder: Secondary | ICD-10-CM | POA: Diagnosis not present

## 2022-10-28 DIAGNOSIS — G7001 Myasthenia gravis with (acute) exacerbation: Secondary | ICD-10-CM | POA: Diagnosis not present

## 2022-10-28 DIAGNOSIS — M25611 Stiffness of right shoulder, not elsewhere classified: Secondary | ICD-10-CM | POA: Diagnosis not present

## 2022-10-28 DIAGNOSIS — R252 Cramp and spasm: Secondary | ICD-10-CM | POA: Diagnosis not present

## 2022-10-28 DIAGNOSIS — M6281 Muscle weakness (generalized): Secondary | ICD-10-CM
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
## 2022-10-30 NOTE — Progress Notes (Signed)
Radiation Oncology         [phone] ________________________________  Name: Faith Massey MRN: [id number]  Date: 10/31/2022  DOB: Sep 04, 1945  Follow-Up Visit Note  CC: Copland, Gwenlyn Found, MD  Loreli Slot, *  No diagnosis found.  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 Presented with stroke like symptoms in January 2024. Work-up incdentally revealed an anterior mediastinal mass and myasthenia gravis.      Interval Since Last Radiation: 3 months and 10 days  Indication for treatment: Curative       Radiation treatment dates: 06/09/22 through 07/21/22  Site/dose: Mediastinum - 54 Gy delivered in 30 Fx at 1.8 Gy/Fx Beams/energy: 6X Technique/Mode: IMRT, Photon  Narrative:  The patient returns today for routine follow-up.  She was last seen here on 08/22/22 for a one month follow-up of radiation to the mediastinum. Since her last visit, the patient has continued to follow with Dr. Myna Hidalgo and receive eliquis.   Since her last visit, the patient presented to her PCP last month with complaints of recurrent UTI's and pneumaturia. 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 CT also showed multiple indeterminate sub-centimeter hypoattenuating lesions within the spleen (incompletely characterized).   She has been evaluated by Dr. Maisie Fus (general surgery) who recommends proceeding with a colonoscopy to rule out malignancy. 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 Maisie Fus may recommend proceeding with a robotic assisted partial colectomy in the future.   She has since been referred Dr. Leonides Schanz at [facility] and she has opted to proceed with colonoscopy. He  procedure is currently scheduled for 11/09/22.   During her most recent visit with Dr. Myna Hidalgo on 10/21/22, the patient denied any issues related to her thymoma. 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                  Other pertinent imaging performed thus far includes a restaging chest CT on 10/21/22 which demonstrated no evidence for recurrent or metastatic disease in the chest and bandlike chronic atelectasis vs scarring in both lungs.     Of note: she also has an MRI of abdomen scheduled for this coming December for further work-up of the multiple indeterminate lesions within the spleen noted on her CT from 07/02.  ***  Allergies:  is allergic to prednisone.  Meds: Current Outpatient Medications  Medication Sig Dispense Refill   apixaban (ELIQUIS) 5 MG TABS tablet Take 1 tablet (5 mg total) by mouth 2 (two) times daily. 180 tablet 1   aspirin EC 81 MG tablet Take 1 tablet (81 mg total) by mouth daily. Swallow whole. 30 tablet 12   Cholecalciferol (VITAMIN D-3) 5000 UNITS TABS Take 5,000 Units by mouth daily.     COLLAGEN PO Take 1 Scoop by mouth daily.     ezetimibe (ZETIA) 10 MG tablet Take 1 tablet (10 mg total) by mouth daily. 30 tablet 3   lisinopril (ZESTRIL) 40 MG tablet Take 1 tablet (40 mg total) by mouth daily. 90 tablet 3   metronidazole (NORITATE) 1 % cream Apply topically daily. Use as needed for  skin breakout 60 g 0   Multiple Vitamins-Minerals (MULTIVITAMIN WITH MINERALS) tablet Take 1 tablet by mouth daily.     Na Sulfate-K Sulfate-Mg Sulf 17.5-3.13-1.6 GM/177ML SOLN Use as directed; may use generic; goodrx card if insurance will not cover generic 354 mL 0   nystatin (MYCOSTATIN) 100000 UNIT/ML suspension Take 5 mLs (500,000 Units total) by mouth 4 (four) times daily. (Patient taking differently: Take 5 mLs by mouth as needed.) 500 mL 1   pyridostigmine  (MESTINON) 60 MG tablet Take 1 tablet (60 mg total) by mouth 3 (three) times daily. 90 tablet 6   traMADol (ULTRAM) 50 MG tablet Take 1 tablet (50 mg total) by mouth every 12 (twelve) hours as needed. 30 tablet 0   No current facility-administered medications for this encounter.    Physical Findings: The patient is in no acute distress. Patient is alert and oriented.  vitals were not taken for this visit. .  No significant changes. Lungs are clear to auscultation bilaterally. Heart has regular rate and rhythm. No palpable cervical, supraclavicular, or axillary adenopathy. Abdomen soft, non-tender, normal bowel sounds.   Lab Findings: Lab Results  Component Value Date   WBC 8.8 10/21/2022   HGB 12.6 10/21/2022   HCT 39.5 10/21/2022   MCV 101.5 (H) 10/21/2022   PLT 215 10/21/2022    Radiographic Findings: CT Chest W Contrast  Result Date: 10/21/2022 CLINICAL DATA:  Thymoma. Status post resection. Restaging. * Tracking Code: BO * EXAM: CT CHEST WITH CONTRAST TECHNIQUE: Multidetector CT imaging of the chest was performed during intravenous contrast administration. RADIATION DOSE REDUCTION: This exam was performed according to the departmental dose-optimization program which includes automated exposure control, adjustment of the mA and/or kV according to patient size and/or use of iterative reconstruction technique. CONTRAST:  75mL OMNIPAQUE IOHEXOL 300 MG/ML  SOLN COMPARISON:  Chest CTA 07/14/2022 FINDINGS: Cardiovascular: The heart size is normal. No substantial pericardial effusion. Mild atherosclerotic calcification is noted in the wall of the thoracic aorta. Mediastinum/Nodes: No mediastinal lymphadenopathy. No abnormal or suspicious soft tissue in the anterior mediastinum There is no hilar lymphadenopathy. Small hiatal hernia. The esophagus has normal imaging features. There is no axillary lymphadenopathy. Lungs/Pleura: Bandlike chronic atelectasis or scarring noted in the right upper, middle  and lower lobes. Atelectasis/scarring noted inferior lingula and posterior left lower lobe. No suspicious pulmonary nodule or mass. No focal airspace consolidation no pulmonary edema or pleural effusion. Upper Abdomen: Visualized portion of the upper abdomen is unremarkable. Musculoskeletal: No worrisome lytic or sclerotic osseous abnormality. IMPRESSION: 1. No evidence for recurrent or metastatic disease in the chest. 2. Bandlike chronic atelectasis or scarring in both lungs. 3. Small hiatal hernia. 4.  Aortic Atherosclerosis (ICD10-I70.0). Electronically Signed   By: Kennith Center M.D.   On: 10/21/2022 12:39   [place] Extrem Up Right Ltd  Result Date: 10/04/2022 Limited musculoskeletal ultrasound of the right upper extremity, right shoulder was performed.  Short axis evaluation of the bicipital groove shows no cortical irregularity of the humeral head or groove of the shoulder.  Within the bicipital groove there is a hypoechoic space with absent visualization of the proximal head of the biceps tendon, indicative of likely proximal bicep tendon tear.  Short and long axis evaluation of the biceps tendon does show retraction into the proximal aspect of the bicep musculature.  Evaluation of the [facility] joint shows at least moderate arthritic changes without significant bursal distention.   Proximal rupture of the long head of the bicep tendon with retraction  US Guided Needle Placement - No Linked Charges  Result Date: 10/04/2022 Technically successful ultrasound-guided right glenohumeral joint injection.   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 Presented with stroke like symptoms in January 2024. Work-up incdentally revealed an anterior mediastinal mass and myasthenia gravis.      The patient is recovering from the effects of radiation.  ***  Plan:  ***   *** minutes of total time was spent for  this patient encounter, including preparation, face-to-face counseling with the patient and coordination of care, physical exam, and documentation of the encounter. ____________________________________  Billie Lade, PhD, MD  This document serves as a record of services personally performed by Antony Blackbird, MD. It was created on his behalf by Neena Rhymes, a trained medical scribe. The creation of this record is based on the scribe's personal observations and the provider's statements to them. This document has been checked and approved by the attending provider.

## 2022-10-31 ENCOUNTER — Ambulatory Visit
Admission: RE | Admit: 2022-10-31 | Discharge: 2022-10-31 | Disposition: A | Discharge status: Home / Self Care | Payer: Medicare HMO | Source: Ambulatory Visit | Attending: Radiation Oncology | Admitting: Radiation Oncology

## 2022-10-31 ENCOUNTER — Encounter: Payer: Self-pay | Admitting: Radiation Oncology

## 2022-10-31 ENCOUNTER — Encounter: Payer: Medicare HMO | Admitting: Physical Therapy

## 2022-10-31 VITALS — BP 140/78 | HR 112 | Temp 97.5°F | Resp 18 | Ht <= 58 in | Wt 115.4 lb

## 2022-10-31 DIAGNOSIS — C37 Malignant neoplasm of thymus: Secondary | ICD-10-CM | POA: Insufficient documentation

## 2022-10-31 DIAGNOSIS — Z7982 Long term (current) use of aspirin: Secondary | ICD-10-CM | POA: Insufficient documentation

## 2022-10-31 DIAGNOSIS — Z79899 Other long term (current) drug therapy: Secondary | ICD-10-CM | POA: Diagnosis not present

## 2022-10-31 DIAGNOSIS — I700 Atherosclerosis of aorta: Secondary | ICD-10-CM | POA: Insufficient documentation

## 2022-10-31 DIAGNOSIS — Z923 Personal history of irradiation: Secondary | ICD-10-CM | POA: Insufficient documentation

## 2022-10-31 DIAGNOSIS — Z87442 Personal history of urinary calculi: Secondary | ICD-10-CM | POA: Diagnosis not present

## 2022-10-31 DIAGNOSIS — G7000 Myasthenia gravis without (acute) exacerbation: Secondary | ICD-10-CM | POA: Diagnosis not present

## 2022-10-31 DIAGNOSIS — Z7901 Long term (current) use of anticoagulants: Secondary | ICD-10-CM | POA: Insufficient documentation

## 2022-10-31 DIAGNOSIS — R222 Localized swelling, mass and lump, trunk: Secondary | ICD-10-CM

## 2022-10-31 DIAGNOSIS — K449 Diaphragmatic hernia without obstruction or gangrene: Secondary | ICD-10-CM | POA: Diagnosis not present

## 2022-10-31 DIAGNOSIS — K632 Fistula of intestine: Secondary | ICD-10-CM | POA: Insufficient documentation

## 2022-10-31 NOTE — Progress Notes (Addendum)
Faith Massey is here today for follow up post radiation to the chest.     Does the patient complain of any of the following: Pain:No Shortness of breath w/wo exertion: Yes Cough: No Hemoptysis: No Pain with swallowing: No Swallowing/choking concerns: No Appetite: Fair Energy Level: Good  Post radiation skin Changes: No  BP (!) 140/78 (BP Location: Left Arm, Patient Position: Sitting)   Pulse (!) 112   Temp (!) 97.5 F (36.4 C) (Temporal)   Resp 18   Ht 4\' 10"  (1.473 m)   Wt 115 lb 6 oz (52.3 kg)   SpO2 100%   BMI 24.11 kg/m      Additional comments if applicable:
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
## 2022-11-02 ENCOUNTER — Encounter: Payer: Self-pay | Admitting: Physical Therapy

## 2022-11-02 ENCOUNTER — Ambulatory Visit: Payer: Medicare HMO | Admitting: Physician Assistant

## 2022-11-02 ENCOUNTER — Ambulatory Visit: Payer: Medicare HMO | Admitting: Physical Therapy

## 2022-11-02 DIAGNOSIS — M25611 Stiffness of right shoulder, not elsewhere classified: Secondary | ICD-10-CM | POA: Diagnosis not present

## 2022-11-02 DIAGNOSIS — M6281 Muscle weakness (generalized): Secondary | ICD-10-CM

## 2022-11-02 DIAGNOSIS — M25511 Pain in right shoulder: Secondary | ICD-10-CM

## 2022-11-02 DIAGNOSIS — R252 Cramp and spasm: Secondary | ICD-10-CM | POA: Diagnosis not present

## 2022-11-03 DIAGNOSIS — G7001 Myasthenia gravis with (acute) exacerbation: Secondary | ICD-10-CM | POA: Diagnosis not present

## 2022-11-03 DIAGNOSIS — G709 Myoneural disorder, unspecified: Secondary | ICD-10-CM | POA: Diagnosis not present

## 2022-11-04 DIAGNOSIS — G7001 Myasthenia gravis with (acute) exacerbation: Secondary | ICD-10-CM | POA: Diagnosis not present

## 2022-11-04 DIAGNOSIS — G709 Myoneural disorder, unspecified: Secondary | ICD-10-CM | POA: Diagnosis not present

## 2022-11-07 ENCOUNTER — Telehealth: Payer: Self-pay | Admitting: Internal Medicine

## 2022-11-07 ENCOUNTER — Ambulatory Visit: Payer: Medicare HMO | Admitting: Physical Therapy

## 2022-11-07 ENCOUNTER — Encounter: Payer: Self-pay | Admitting: Internal Medicine

## 2022-11-07 NOTE — Telephone Encounter (Signed)
Patient called states is woke up and had to vomit which is usual for her, she has a procedure scheduled for 11/09/22 not sure if she can still proceed. Please advise.

## 2022-11-08 NOTE — Telephone Encounter (Signed)
Called the patient. No answer. Patient has also sent a message through My Chart. Will respond to her My Chart message.

## 2022-11-08 NOTE — Telephone Encounter (Signed)
Spoke with the patient. She reports nausea and 1 episode of vomiting yesterday. "Low grade." Does not feel 100% today. No further vomiting or fever. Concerns of being able to tolerate the prep until feeling better. Wants to have colonoscopy asap due to fistula. Rescheduled the colonoscopy until 11/16/22. Meanwhile patient is to encourage fluid, avoid rich foods and dairy. She will call if she develops fever, has nausea and vomiting or other concerns.

## 2022-11-09 ENCOUNTER — Telehealth: Payer: Self-pay | Admitting: *Deleted

## 2022-11-09 ENCOUNTER — Encounter: Payer: Medicare HMO | Admitting: Internal Medicine

## 2022-11-09 NOTE — Telephone Encounter (Signed)
Called patient to inform that she is able to take Tylenol tor her low- grade temp of 100.6. Patient wanted to know if it would be ok to take Aspirin instead of Tylenol. Informed the patient to avoid taking aspirin and take the Tylenol instead due to possible bleeding aspirin can cause since she is scheduled for the colonoscopy on 11/16/22. Also informed the patient to continue to monitor her fever and any symptoms. She also states she is nauseous as of yesterday and today, but has not vomited. Patient also states she has taken Tums and is relieved. Denies SOB. Patient has taken a Covid test that is negative and is also expired. After checking, patient called back and informed to get a Covid test that is not expired. Given instruction as to where to obtain the Covid tests, either the Health Department or any Pharmacy. Patient understood and agreed.

## 2022-11-10 ENCOUNTER — Inpatient Hospital Stay (HOSPITAL_BASED_OUTPATIENT_CLINIC_OR_DEPARTMENT_OTHER)
Admission: EM | Admit: 2022-11-10 | Discharge: 2022-11-14 | DRG: 690 | Disposition: A | Discharge status: Home / Self Care | Payer: Medicare HMO | Attending: Internal Medicine | Admitting: Internal Medicine

## 2022-11-10 ENCOUNTER — Ambulatory Visit: Payer: Medicare HMO

## 2022-11-10 ENCOUNTER — Encounter (HOSPITAL_BASED_OUTPATIENT_CLINIC_OR_DEPARTMENT_OTHER): Payer: Self-pay | Admitting: Urology

## 2022-11-10 ENCOUNTER — Emergency Department (HOSPITAL_BASED_OUTPATIENT_CLINIC_OR_DEPARTMENT_OTHER): Payer: Medicare HMO

## 2022-11-10 ENCOUNTER — Encounter: Payer: Self-pay | Admitting: Family Medicine

## 2022-11-10 ENCOUNTER — Other Ambulatory Visit: Payer: Self-pay

## 2022-11-10 DIAGNOSIS — Z7901 Long term (current) use of anticoagulants: Secondary | ICD-10-CM

## 2022-11-10 DIAGNOSIS — R7303 Prediabetes: Secondary | ICD-10-CM | POA: Diagnosis present

## 2022-11-10 DIAGNOSIS — Z86711 Personal history of pulmonary embolism: Secondary | ICD-10-CM

## 2022-11-10 DIAGNOSIS — B9620 Unspecified Escherichia coli [E. coli] as the cause of diseases classified elsewhere: Secondary | ICD-10-CM | POA: Diagnosis present

## 2022-11-10 DIAGNOSIS — I10 Essential (primary) hypertension: Secondary | ICD-10-CM | POA: Diagnosis present

## 2022-11-10 DIAGNOSIS — Z1152 Encounter for screening for COVID-19: Secondary | ICD-10-CM

## 2022-11-10 DIAGNOSIS — R918 Other nonspecific abnormal finding of lung field: Secondary | ICD-10-CM | POA: Diagnosis not present

## 2022-11-10 DIAGNOSIS — R509 Fever, unspecified: Principal | ICD-10-CM

## 2022-11-10 DIAGNOSIS — Z8673 Personal history of transient ischemic attack (TIA), and cerebral infarction without residual deficits: Secondary | ICD-10-CM

## 2022-11-10 DIAGNOSIS — Z823 Family history of stroke: Secondary | ICD-10-CM

## 2022-11-10 DIAGNOSIS — M810 Age-related osteoporosis without current pathological fracture: Secondary | ICD-10-CM | POA: Diagnosis present

## 2022-11-10 DIAGNOSIS — N10 Acute tubulo-interstitial nephritis: Principal | ICD-10-CM | POA: Diagnosis present

## 2022-11-10 DIAGNOSIS — Z90710 Acquired absence of both cervix and uterus: Secondary | ICD-10-CM

## 2022-11-10 DIAGNOSIS — Z8249 Family history of ischemic heart disease and other diseases of the circulatory system: Secondary | ICD-10-CM

## 2022-11-10 DIAGNOSIS — Z853 Personal history of malignant neoplasm of breast: Secondary | ICD-10-CM

## 2022-11-10 DIAGNOSIS — Z9013 Acquired absence of bilateral breasts and nipples: Secondary | ICD-10-CM

## 2022-11-10 DIAGNOSIS — G7000 Myasthenia gravis without (acute) exacerbation: Secondary | ICD-10-CM | POA: Diagnosis present

## 2022-11-10 DIAGNOSIS — Z888 Allergy status to other drugs, medicaments and biological substances status: Secondary | ICD-10-CM

## 2022-11-10 DIAGNOSIS — K449 Diaphragmatic hernia without obstruction or gangrene: Secondary | ICD-10-CM | POA: Diagnosis not present

## 2022-11-10 DIAGNOSIS — Z923 Personal history of irradiation: Secondary | ICD-10-CM

## 2022-11-10 DIAGNOSIS — R7881 Bacteremia: Secondary | ICD-10-CM | POA: Diagnosis present

## 2022-11-10 DIAGNOSIS — G709 Myoneural disorder, unspecified: Secondary | ICD-10-CM | POA: Diagnosis present

## 2022-11-10 DIAGNOSIS — E871 Hypo-osmolality and hyponatremia: Secondary | ICD-10-CM | POA: Diagnosis not present

## 2022-11-10 DIAGNOSIS — N12 Tubulo-interstitial nephritis, not specified as acute or chronic: Secondary | ICD-10-CM | POA: Diagnosis present

## 2022-11-10 DIAGNOSIS — Z66 Do not resuscitate: Secondary | ICD-10-CM | POA: Diagnosis present

## 2022-11-10 DIAGNOSIS — R000 Tachycardia, unspecified: Secondary | ICD-10-CM | POA: Diagnosis not present

## 2022-11-10 DIAGNOSIS — N321 Vesicointestinal fistula: Secondary | ICD-10-CM | POA: Diagnosis present

## 2022-11-10 DIAGNOSIS — K5730 Diverticulosis of large intestine without perforation or abscess without bleeding: Secondary | ICD-10-CM | POA: Diagnosis not present

## 2022-11-10 DIAGNOSIS — N2889 Other specified disorders of kidney and ureter: Secondary | ICD-10-CM | POA: Diagnosis present

## 2022-11-10 DIAGNOSIS — Z820 Family history of epilepsy and other diseases of the nervous system: Secondary | ICD-10-CM

## 2022-11-10 DIAGNOSIS — E785 Hyperlipidemia, unspecified: Secondary | ICD-10-CM | POA: Diagnosis present

## 2022-11-10 DIAGNOSIS — Z7982 Long term (current) use of aspirin: Secondary | ICD-10-CM

## 2022-11-10 DIAGNOSIS — Z79899 Other long term (current) drug therapy: Secondary | ICD-10-CM

## 2022-11-10 HISTORY — DX: Tubulo-interstitial nephritis, not specified as acute or chronic: N12

## 2022-11-10 LAB — URINALYSIS, ROUTINE W REFLEX MICROSCOPIC
Bilirubin Urine: NEGATIVE
Glucose, UA: NEGATIVE mg/dL
Ketones, ur: NEGATIVE mg/dL
Nitrite: POSITIVE — AB
Protein, ur: 100 mg/dL — AB
Specific Gravity, Urine: 1.01 (ref 1.005–1.030)
pH: 6 (ref 5.0–8.0)

## 2022-11-10 LAB — COMPREHENSIVE METABOLIC PANEL
ALT: 26 U/L (ref 0–44)
AST: 29 U/L (ref 15–41)
Albumin: 3.4 g/dL — ABNORMAL LOW (ref 3.5–5.0)
Alkaline Phosphatase: 67 U/L (ref 38–126)
Anion gap: 14 (ref 5–15)
BUN: 17 mg/dL (ref 8–23)
CO2: 21 mmol/L — ABNORMAL LOW (ref 22–32)
Calcium: 8.9 mg/dL (ref 8.9–10.3)
Chloride: 99 mmol/L (ref 98–111)
Creatinine, Ser: 0.77 mg/dL (ref 0.44–1.00)
GFR, Estimated: >60 mL/min (ref >60)
Glucose, Bld: 118 mg/dL — ABNORMAL HIGH (ref 70–99)
Potassium: 4.1 mmol/L (ref 3.5–5.1)
Sodium: 134 mmol/L — ABNORMAL LOW (ref 135–145)
Total Bilirubin: 1 mg/dL (ref 0.3–1.2)
Total Protein: 7.6 g/dL (ref 6.5–8.1)

## 2022-11-10 LAB — CBC WITH DIFFERENTIAL/PLATELET
Abs Immature Granulocytes: 0.06 10*3/uL (ref 0.00–0.07)
Basophils Absolute: 0 10*3/uL (ref 0.0–0.1)
Basophils Relative: 0 %
Eosinophils Absolute: 0 10*3/uL (ref 0.0–0.5)
Eosinophils Relative: 0 %
HCT: 40.3 % (ref 36.0–46.0)
Hemoglobin: 13.3 g/dL (ref 12.0–15.0)
Immature Granulocytes: 0 %
Lymphocytes Relative: 7 %
Lymphs Abs: 1.1 10*3/uL (ref 0.7–4.0)
MCH: 32 pg (ref 26.0–34.0)
MCHC: 33 g/dL (ref 30.0–36.0)
MCV: 97.1 fL (ref 80.0–100.0)
Monocytes Absolute: 2.4 10*3/uL — ABNORMAL HIGH (ref 0.1–1.0)
Monocytes Relative: 15 %
Neutro Abs: 12.9 10*3/uL — ABNORMAL HIGH (ref 1.7–7.7)
Neutrophils Relative %: 78 %
Platelets: 205 10*3/uL (ref 150–400)
RBC: 4.15 MIL/uL (ref 3.87–5.11)
RDW: 14.2 % (ref 11.5–15.5)
WBC: 16.5 10*3/uL — ABNORMAL HIGH (ref 4.0–10.5)
nRBC: 0 % (ref 0.0–0.2)

## 2022-11-10 LAB — URINALYSIS, MICROSCOPIC (REFLEX)

## 2022-11-10 LAB — RESP PANEL BY RT-PCR (RSV, FLU A&B, COVID)  RVPGX2
Influenza A by PCR: NEGATIVE
Influenza B by PCR: NEGATIVE
Resp Syncytial Virus by PCR: NEGATIVE
SARS Coronavirus 2 by RT PCR: NEGATIVE

## 2022-11-10 LAB — LACTIC ACID, PLASMA
Lactic Acid, Venous: 1.8 mmol/L (ref 0.5–1.9)
Lactic Acid, Venous: 2.3 mmol/L (ref 0.5–1.9)

## 2022-11-10 MED ORDER — ACETAMINOPHEN 500 MG PO TABS
1000.0000 mg | ORAL_TABLET | Freq: Once | ORAL | Status: AC
  Administered 2022-11-10: 1000 mg via ORAL
  Filled 2022-11-10: qty 2

## 2022-11-10 MED ORDER — LACTATED RINGERS IV BOLUS
500.0000 mL | Freq: Once | INTRAVENOUS | Status: AC
  Administered 2022-11-10: 500 mL via INTRAVENOUS

## 2022-11-10 MED ORDER — LACTATED RINGERS IV BOLUS
1000.0000 mL | Freq: Once | INTRAVENOUS | Status: AC
  Administered 2022-11-10: 1000 mL via INTRAVENOUS

## 2022-11-10 MED ORDER — SODIUM CHLORIDE 0.9 % IV SOLN
1.0000 g | Freq: Once | INTRAVENOUS | Status: AC
  Administered 2022-11-10: 1 g via INTRAVENOUS
  Filled 2022-11-10: qty 10

## 2022-11-10 MED ORDER — IOHEXOL 300 MG/ML  SOLN
100.0000 mL | Freq: Once | INTRAMUSCULAR | Status: AC | PRN
  Administered 2022-11-10: 100 mL via INTRAVENOUS

## 2022-11-10 NOTE — Progress Notes (Signed)
Plan of Care Note for accepted transfer   Patient: Faith Massey MRN: [id number]   DOA: 11/10/2022  Facility requesting transfer: [facility]   Requesting Provider: Dr. 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  She is found to be febrile and tachycardic with WBC 16,500, lactic acid 2.3, and CT findings concerning for right pyelonephritis and ureteritis.  Blood cultures were collected and she was treated with Rocephin and 1.5 L of IVF.  Plan of care: The patient is accepted for admission to Progressive unit, at [facility].   Author: Briscoe Deutscher, MD 11/10/2022  Check [email or web address] for on-call coverage.  Nursing staff, Please call [facility] Admits & Consults System-Wide number on Amion as soon as patient's arrival, so appropriate admitting provider can evaluate the pt.

## 2022-11-10 NOTE — ED Notes (Signed)
Received an order for a NIF. Demonstrated to patient the proper way to measure NIF on device. Patient was consistently able to generate greater than 30 X 3. Patient tolerated well.

## 2022-11-10 NOTE — Telephone Encounter (Signed)
Called her- she feels weak and feverish Today is Thursday- she notes on Monday she was having shaking chills and she had a temp of about 101 She continued to run fevers until today- highest temp was today 102.9 She has no other particular symptoms that she can pinpoint  I urged her to come to the ER asap and she agrees, she will get someone to drive her to the ER

## 2022-11-10 NOTE — ED Provider Notes (Signed)
[facility] Provider Note   CSN: [id number] Arrival date & time: 11/10/22  1747     History  Chief Complaint  Patient presents with   Fever    Faith Massey is a 77 y.o. 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   Starting Monday, she had chills and had a fever to 101F. She also felt nauseous, but this has since improved. She has also been feeling unbalanced and weak. She has decreased appetite but reports good fluid intake. Started to have some congestion today. She has been taking tylenol for the fever.   Denies lightheadedness, diarrhea, rashes, vomiting, myalgias. No recent travel, no sick contacts. Denies dysuria, foul smelling urine, stool in urine.    Per Chart Review:  Pt messaged her PCP that she started have fever to 101F on Monday and continued to fever. Had fever 102.69F today. Felt weak, but no other particular symptoms. Had 2 negative home covid tests. Was recommended by PCP to come to ER given her hx of colovesical fistula and fever.    Fever      Home Medications Prior to Admission medications   Medication Sig Start Date End Date Taking? Authorizing Provider  apixaban (ELIQUIS) 5 MG TABS tablet Take 1 tablet (5 mg total) by mouth 2 (two) times daily. 10/21/22 04/19/23  Copland, Gwenlyn Found, MD  aspirin EC 81 MG tablet Take 1 tablet (81 mg total) by mouth daily. Swallow whole. 03/30/22   Osvaldo Shipper, MD  Cholecalciferol (VITAMIN D-3) 5000 UNITS TABS Take 5,000 Units by mouth daily.    [provider]  COLLAGEN PO Take 1 Scoop by mouth daily.    [provider]  ezetimibe (ZETIA) 10 MG tablet Take 1 tablet (10 mg total) by mouth daily. 03/30/22   Osvaldo Shipper, MD  lisinopril (ZESTRIL) 40 MG tablet Take 1 tablet (40 mg total) by mouth daily. 06/14/22 03/31/23  Copland, Gwenlyn Found, MD  metronidazole (NORITATE) 1 % cream Apply topically  daily. Use as needed for skin breakout 10/26/22   Copland, Gwenlyn Found, MD  Multiple Vitamins-Minerals (MULTIVITAMIN WITH MINERALS) tablet Take 1 tablet by mouth daily.    [provider]  Na Sulfate-K Sulfate-Mg Sulf 17.5-3.13-1.6 GM/177ML SOLN Use as directed; may use generic; goodrx card if insurance will not cover generic 10/24/22   Imogene Burn, MD  nystatin (MYCOSTATIN) 100000 UNIT/ML suspension Take 5 mLs (500,000 Units total) by mouth 4 (four) times daily. Patient taking differently: Take 5 mLs by mouth as needed. 06/06/22   Levert Feinstein, MD  pyridostigmine (MESTINON) 60 MG tablet Take 1 tablet (60 mg total) by mouth 3 (three) times daily. 07/14/22   Levert Feinstein, MD  traMADol (ULTRAM) 50 MG tablet Take 1 tablet (50 mg total) by mouth every 12 (twelve) hours as needed. 09/14/22   Copland, Gwenlyn Found, MD      Allergies    Prednisone    Review of Systems   Review of Systems  Constitutional:  Positive for fever.   Physical Exam Updated Vital Signs BP (!) 167/71   Pulse (!) 123   Temp (!) 103.2 F (39.6 C) (Oral)   Resp 18   Ht 4\' 10"  (1.473 m)   Wt 52.3 kg   SpO2 90%   BMI 24.10 kg/m  Physical Exam General: Alert, pleasant, non-toxic appearing older woman bundled up in blankets. Speaking appropriately in full sentences. NAD. HEENT:  NCAT. MMM. CV: RRR, no murmurs. 2+ dorsalis pedis pulses.  Resp: CTAB, no wheezing or crackles. Normal WOB on RA. Occasionally sniffling.  Abm: Soft, nontender, nondistended. BS present. No CVA tenderness.  Ext: Moves all ext spontaneously Skin: Warm, well perfused   ED Results / Procedures / Treatments   Labs (all labs ordered are listed, but only abnormal results are displayed) Labs Reviewed  URINALYSIS, ROUTINE W REFLEX MICROSCOPIC - Abnormal; Notable for the following components:      Result Value   APPearance HAZY (*)    Hgb urine dipstick MODERATE (*)    Protein, ur 100 (*)    Nitrite POSITIVE (*)    Leukocytes,Ua SMALL (*)    All  other components within normal limits  CBC WITH DIFFERENTIAL/PLATELET - Abnormal; Notable for the following components:   WBC 16.5 (*)    Neutro Abs 12.9 (*)    Monocytes Absolute 2.4 (*)    All other components within normal limits  COMPREHENSIVE METABOLIC PANEL - Abnormal; Notable for the following components:   Sodium 134 (*)    CO2 21 (*)    Glucose, Bld 118 (*)    Albumin 3.4 (*)    All other components within normal limits  LACTIC ACID, PLASMA - Abnormal; Notable for the following components:   Lactic Acid, Venous 2.3 (*)    All other components within normal limits  URINALYSIS, MICROSCOPIC (REFLEX) - Abnormal; Notable for the following components:   Bacteria, UA MANY (*)    All other components within normal limits  RESP PANEL BY RT-PCR (RSV, FLU A&B, COVID)  RVPGX2  CULTURE, BLOOD (ROUTINE X 2)  CULTURE, BLOOD (ROUTINE X 2)  LACTIC ACID, PLASMA    EKG None  Radiology CT ABDOMEN PELVIS W CONTRAST  Result Date: 11/10/2022 CLINICAL DATA:  Left lower quadrant abdominal pain. Evaluate for colovesicular fistula. Fever. EXAM: CT ABDOMEN AND PELVIS WITH CONTRAST TECHNIQUE: Multidetector CT imaging of the abdomen and pelvis was performed using the standard protocol following bolus administration of intravenous contrast. RADIATION DOSE REDUCTION: This exam was performed according to the departmental dose-optimization program which includes automated exposure control, adjustment of the mA and/or kV according to patient size and/or use of iterative reconstruction technique. CONTRAST:  OMNIPAQUE IOHEXOL 300 MG/ML  SOLN COMPARISON:  CT abdomen and pelvis 09/20/2022 FINDINGS: Lower chest: No acute abnormality. Hepatobiliary: No focal liver abnormality is seen. No gallstones, gallbladder wall thickening, or biliary dilatation. Pancreas: Unremarkable. No pancreatic ductal dilatation or surrounding inflammatory changes. Spleen: There are scattered subcentimeter rounded hypodensities  throughout the spleen which are unchanged from prior. The spleen is normal in size. Adrenals/Urinary Tract: There are patchy areas of hypoattenuation throughout the right renal parenchyma, new from prior. There is new mild right perinephric fat stranding. There is also wall enhancement of the proximal right ureter. There is no hydronephrosis. The adrenal glands and left kidney are within normal limits. No bladder air identified. There some mild focal wall thickening of the posterior left bladder as it abuts the diverticulum, unchanged from prior. Stomach/Bowel: Focal diverticulum abuts the posterior right bladder and vaginal cuff and has a similar appearance to prior. No surrounding inflammation. There is no bowel obstruction or free air. There is diffuse colonic diverticulosis. The appendix is within normal limits. There is a small hiatal hernia. Vascular/Lymphatic: Aortic atherosclerosis. No enlarged abdominal or pelvic lymph nodes. Reproductive: Status post hysterectomy. No adnexal masses. Other: No abdominal wall hernia or abnormality. No abdominopelvic ascites. Musculoskeletal: Degenerative changes affect  the spine IMPRESSION: 1. Findings compatible with right-sided pyelonephritis and ureteritis. 2. Again seen is a small amount of bladder wall thickening on the left with adjacent prominent diverticulum and vaginal cuff. There is no surrounding inflammation and there is no air within the bladder to suggest definitive colovesicular fistula at this time. 3. Colonic diverticulosis without evidence for diverticulitis. 4. Stable subcentimeter rounded hypodensities throughout the spleen, indeterminate. Aortic Atherosclerosis (ICD10-I70.0). Electronically Signed   By: Darliss Cheney M.D.   On: 11/10/2022 22:18   DG Chest Portable 1 View  Result Date: 11/10/2022 CLINICAL DATA:  Fever of unknown origin. EXAM: PORTABLE CHEST 1 VIEW COMPARISON:  September 20, 2022 FINDINGS: The heart size and mediastinal contours are within  normal limits. Mild, chronic linear scarring and/or atelectasis is seen within the mid right lung and left lung base. No pleural effusion or pneumothorax is identified. There is mild dextroscoliosis of mid to lower thoracic spine with multilevel degenerative changes. IMPRESSION: Mild, chronic linear scarring and/or atelectasis within the mid right lung and left lung base. Electronically Signed   By: Aram Candela M.D.   On: 11/10/2022 20:21    Procedures Procedures   Medications Ordered in ED Medications  lactated ringers bolus 500 mL (0 mLs Intravenous Stopped 11/10/22 2151)  iohexol (OMNIPAQUE) 300 MG/ML solution 100 mL (100 mLs Intravenous Contrast Given 11/10/22 2122)  cefTRIAXone (ROCEPHIN) 1 g in sodium chloride 0.9 % 100 mL IVPB (1 g Intravenous New Bag/Given 11/10/22 2259)  lactated ringers bolus 1,000 mL (1,000 mLs Intravenous New Bag/Given 11/10/22 2255)  acetaminophen (TYLENOL) tablet 1,000 mg (1,000 mg Oral Given 11/10/22 2324)    ED Course/ Medical Decision Making/ A&P                                 Medical Decision Making:   Shanet Shariff is a 77 y.o. female who presented to the ED today with fever detailed above.     Complete initial physical exam performed, there a no obvious signs of infection. Reviewed and confirmed nursing documentation for past medical history, family history, social history.    Initial Assessment:   With the patient's presentation of fever, differential includes URI, PNA, UTI, gastroenteritis. Will obtain further infectious workup detailed below. Given hx of myasthenia gravis, will also be cautious with regard to antibiotics and meds that could precipitate myasthenia symptoms.   This is most consistent with an acute complicated illness  Initial Plan:  Screening labs including CBC, Metabolic panel, and lactic acid to evaluate for infectious or metabolic etiology of disease.  Urinalysis with reflex culture ordered to evaluate for UTI or relevant  urologic/nephrologic pathology.  CXR to evaluate for structural/infectious intrathoracic pathology.  Lactic acid to evaluate sepsis COVID RPP to workup URI 500cc LR bolus given tachycardia Objective evaluation as below reviewed   Initial Study Results:   Laboratory  - UA notable for nitrites and many bacteria - CBC notable for leukocytosis w/ neutrophilia - lactic acid wnl   EKG EKG was reviewed independently. Rate, rhythm, axis, intervals all examined and without medically relevant abnormality. ST segments without concerns for elevations.    Radiology:  All images reviewed independently. Agree with radiology report at this time.   CT ABDOMEN PELVIS W CONTRAST  Result Date: 11/10/2022 CLINICAL DATA:  Left lower quadrant abdominal pain. Evaluate for colovesicular fistula. Fever. EXAM: CT ABDOMEN AND PELVIS WITH CONTRAST TECHNIQUE: Multidetector CT imaging of the abdomen and  pelvis was performed using the standard protocol following bolus administration of intravenous contrast. RADIATION DOSE REDUCTION: This exam was performed according to the departmental dose-optimization program which includes automated exposure control, adjustment of the mA and/or kV according to patient size and/or use of iterative reconstruction technique. CONTRAST:  OMNIPAQUE IOHEXOL 300 MG/ML  SOLN COMPARISON:  CT abdomen and pelvis 09/20/2022 FINDINGS: Lower chest: No acute abnormality. Hepatobiliary: No focal liver abnormality is seen. No gallstones, gallbladder wall thickening, or biliary dilatation. Pancreas: Unremarkable. No pancreatic ductal dilatation or surrounding inflammatory changes. Spleen: There are scattered subcentimeter rounded hypodensities throughout the spleen which are unchanged from prior. The spleen is normal in size. Adrenals/Urinary Tract: There are patchy areas of hypoattenuation throughout the right renal parenchyma, new from prior. There is new mild right perinephric fat stranding. There is  also wall enhancement of the proximal right ureter. There is no hydronephrosis. The adrenal glands and left kidney are within normal limits. No bladder air identified. There some mild focal wall thickening of the posterior left bladder as it abuts the diverticulum, unchanged from prior. Stomach/Bowel: Focal diverticulum abuts the posterior right bladder and vaginal cuff and has a similar appearance to prior. No surrounding inflammation. There is no bowel obstruction or free air. There is diffuse colonic diverticulosis. The appendix is within normal limits. There is a small hiatal hernia. Vascular/Lymphatic: Aortic atherosclerosis. No enlarged abdominal or pelvic lymph nodes. Reproductive: Status post hysterectomy. No adnexal masses. Other: No abdominal wall hernia or abnormality. No abdominopelvic ascites. Musculoskeletal: Degenerative changes affect the spine IMPRESSION: 1. Findings compatible with right-sided pyelonephritis and ureteritis. 2. Again seen is a small amount of bladder wall thickening on the left with adjacent prominent diverticulum and vaginal cuff. There is no surrounding inflammation and there is no air within the bladder to suggest definitive colovesicular fistula at this time. 3. Colonic diverticulosis without evidence for diverticulitis. 4. Stable subcentimeter rounded hypodensities throughout the spleen, indeterminate. Aortic Atherosclerosis (ICD10-I70.0). Electronically Signed   By: Darliss Cheney M.D.   On: 11/10/2022 22:18   DG Chest Portable 1 View  Result Date: 11/10/2022 CLINICAL DATA:  Fever of unknown origin. EXAM: PORTABLE CHEST 1 VIEW COMPARISON:  September 20, 2022 FINDINGS: The heart size and mediastinal contours are within normal limits. Mild, chronic linear scarring and/or atelectasis is seen within the mid right lung and left lung base. No pleural effusion or pneumothorax is identified. There is mild dextroscoliosis of mid to lower thoracic spine with multilevel degenerative  changes. IMPRESSION: Mild, chronic linear scarring and/or atelectasis within the mid right lung and left lung base. Electronically Signed   By: Aram Candela M.D.   On: 11/10/2022 20:21   CT Chest W Contrast  Result Date: 10/21/2022 CLINICAL DATA:  Thymoma. Status post resection. Restaging. * Tracking Code: BO * EXAM: CT CHEST WITH CONTRAST TECHNIQUE: Multidetector CT imaging of the chest was performed during intravenous contrast administration. RADIATION DOSE REDUCTION: This exam was performed according to the departmental dose-optimization program which includes automated exposure control, adjustment of the mA and/or kV according to patient size and/or use of iterative reconstruction technique. CONTRAST:  75mL OMNIPAQUE IOHEXOL 300 MG/ML  SOLN COMPARISON:  Chest CTA 07/14/2022 FINDINGS: Cardiovascular: The heart size is normal. No substantial pericardial effusion. Mild atherosclerotic calcification is noted in the wall of the thoracic aorta. Mediastinum/Nodes: No mediastinal lymphadenopathy. No abnormal or suspicious soft tissue in the anterior mediastinum There is no hilar lymphadenopathy. Small hiatal hernia. The esophagus has normal imaging features. There is  no axillary lymphadenopathy. Lungs/Pleura: Bandlike chronic atelectasis or scarring noted in the right upper, middle and lower lobes. Atelectasis/scarring noted inferior lingula and posterior left lower lobe. No suspicious pulmonary nodule or mass. No focal airspace consolidation no pulmonary edema or pleural effusion. Upper Abdomen: Visualized portion of the upper abdomen is unremarkable. Musculoskeletal: No worrisome lytic or sclerotic osseous abnormality. IMPRESSION: 1. No evidence for recurrent or metastatic disease in the chest. 2. Bandlike chronic atelectasis or scarring in both lungs. 3. Small hiatal hernia. 4.  Aortic Atherosclerosis (ICD10-I70.0). Electronically Signed   By: Kennith Center M.D.   On: 10/21/2022 12:39      Consults: Case  discussed with Neurology. They advise that pt's weakness and concurrent fever could be signs of worsening myasthenia symptoms, but does not necessarily warrant admission for myasthenia  Reassessment and Plan:   - UA c/f UTI and CTAP showing R sided pyelonephritis, this is most c/w pyelonephritis as pt's source of fever. Additionally pt is tachycardic to 125 and lactic acid is uptrended to 2.3. Will give 1 dose CTX and consult hospitalist for admission.  - Given uptrending lactic acid and tachycardia, c/f potential Urosepsis. Will also give additional 1L LR bolus.  - CXR neg, no focal lung findings on exam, and normal WOB on RA makes PNA unlikely.   - Consult hospitalist for admission   Final Clinical Impression(s) / ED Diagnoses Final diagnoses:  Febrile illness  Pyelonephritis    Rx / DC Orders ED Discharge Orders     None         Lincoln Brigham, MD 11/10/22 [id number]    Alvira Monday, MD 11/11/22 2354

## 2022-11-10 NOTE — ED Notes (Signed)
Pt ret from ct

## 2022-11-10 NOTE — ED Notes (Signed)
Pt alert, NAD, calm, interactive. EDP at BS.  

## 2022-11-10 NOTE — ED Notes (Signed)
Date and time results received: 11/10/22 2227 (use smartphrase ".now" to insert current time)  Test: lactic Critical Value: 2.3  Name of Provider Notified: schlossman  Orders Received? Or Actions Taken?: orders in

## 2022-11-10 NOTE — ED Notes (Signed)
Pt in Ct  

## 2022-11-10 NOTE — ED Notes (Signed)
Patient placed on 1L [place] do to oxygen saturation of 89-90%. Patient oxygen saturation increased tp 94%. Patient tolerating well.

## 2022-11-10 NOTE — ED Triage Notes (Addendum)
Pt states she has had a fever since Monday  Negative at home COVID test, pcp told to come to ER due to possible infection from fistula between colon and bladder  Colonoscopy scheduled for today was cancelled due to fever of 102.9 at home  No fever now, NAD distress noted, denies any pain or associated symptoms

## 2022-11-11 ENCOUNTER — Telehealth: Payer: Self-pay

## 2022-11-11 ENCOUNTER — Encounter (HOSPITAL_COMMUNITY): Payer: Self-pay | Admitting: Family Medicine

## 2022-11-11 DIAGNOSIS — Z888 Allergy status to other drugs, medicaments and biological substances status: Secondary | ICD-10-CM | POA: Diagnosis not present

## 2022-11-11 DIAGNOSIS — Z853 Personal history of malignant neoplasm of breast: Secondary | ICD-10-CM | POA: Diagnosis not present

## 2022-11-11 DIAGNOSIS — Z86711 Personal history of pulmonary embolism: Secondary | ICD-10-CM | POA: Diagnosis not present

## 2022-11-11 DIAGNOSIS — Z8249 Family history of ischemic heart disease and other diseases of the circulatory system: Secondary | ICD-10-CM | POA: Diagnosis not present

## 2022-11-11 DIAGNOSIS — N321 Vesicointestinal fistula: Secondary | ICD-10-CM | POA: Diagnosis not present

## 2022-11-11 DIAGNOSIS — N12 Tubulo-interstitial nephritis, not specified as acute or chronic: Secondary | ICD-10-CM | POA: Diagnosis not present

## 2022-11-11 DIAGNOSIS — M810 Age-related osteoporosis without current pathological fracture: Secondary | ICD-10-CM | POA: Diagnosis not present

## 2022-11-11 DIAGNOSIS — G7000 Myasthenia gravis without (acute) exacerbation: Secondary | ICD-10-CM | POA: Diagnosis not present

## 2022-11-11 DIAGNOSIS — Z1152 Encounter for screening for COVID-19: Secondary | ICD-10-CM | POA: Diagnosis not present

## 2022-11-11 DIAGNOSIS — B9620 Unspecified Escherichia coli [E. coli] as the cause of diseases classified elsewhere: Secondary | ICD-10-CM | POA: Diagnosis not present

## 2022-11-11 DIAGNOSIS — R7303 Prediabetes: Secondary | ICD-10-CM | POA: Diagnosis not present

## 2022-11-11 DIAGNOSIS — Z7982 Long term (current) use of aspirin: Secondary | ICD-10-CM | POA: Diagnosis not present

## 2022-11-11 DIAGNOSIS — Z8673 Personal history of transient ischemic attack (TIA), and cerebral infarction without residual deficits: Secondary | ICD-10-CM | POA: Diagnosis not present

## 2022-11-11 DIAGNOSIS — R7881 Bacteremia: Secondary | ICD-10-CM | POA: Diagnosis not present

## 2022-11-11 DIAGNOSIS — Z7901 Long term (current) use of anticoagulants: Secondary | ICD-10-CM | POA: Diagnosis not present

## 2022-11-11 DIAGNOSIS — N10 Acute tubulo-interstitial nephritis: Secondary | ICD-10-CM | POA: Diagnosis not present

## 2022-11-11 DIAGNOSIS — Z823 Family history of stroke: Secondary | ICD-10-CM | POA: Diagnosis not present

## 2022-11-11 DIAGNOSIS — Z923 Personal history of irradiation: Secondary | ICD-10-CM | POA: Diagnosis not present

## 2022-11-11 DIAGNOSIS — E871 Hypo-osmolality and hyponatremia: Secondary | ICD-10-CM | POA: Diagnosis not present

## 2022-11-11 DIAGNOSIS — Z90710 Acquired absence of both cervix and uterus: Secondary | ICD-10-CM | POA: Diagnosis not present

## 2022-11-11 DIAGNOSIS — Z66 Do not resuscitate: Secondary | ICD-10-CM | POA: Diagnosis not present

## 2022-11-11 DIAGNOSIS — Z9013 Acquired absence of bilateral breasts and nipples: Secondary | ICD-10-CM | POA: Diagnosis not present

## 2022-11-11 DIAGNOSIS — Z820 Family history of epilepsy and other diseases of the nervous system: Secondary | ICD-10-CM | POA: Diagnosis not present

## 2022-11-11 DIAGNOSIS — E785 Hyperlipidemia, unspecified: Secondary | ICD-10-CM | POA: Diagnosis not present

## 2022-11-11 DIAGNOSIS — I10 Essential (primary) hypertension: Secondary | ICD-10-CM | POA: Diagnosis not present

## 2022-11-11 LAB — BLOOD CULTURE ID PANEL (REFLEXED) - BCID2

## 2022-11-11 MED ORDER — TRAMADOL HCL 50 MG PO TABS
50.0000 mg | ORAL_TABLET | Freq: Two times a day (BID) | ORAL | Status: DC | PRN
  Administered 2022-11-14: 50 mg via ORAL
  Filled 2022-11-11 (×2): qty 1

## 2022-11-11 MED ORDER — ACETAMINOPHEN 325 MG PO TABS
650.0000 mg | ORAL_TABLET | Freq: Four times a day (QID) | ORAL | Status: DC | PRN
  Administered 2022-11-11 – 2022-11-14 (×6): 650 mg via ORAL
  Filled 2022-11-11 (×7): qty 2

## 2022-11-11 MED ORDER — SODIUM CHLORIDE 0.9 % IV SOLN: 250.0000 mL | INTRAVENOUS | Status: DC | PRN

## 2022-11-11 MED ORDER — LISINOPRIL 20 MG PO TABS
40.0000 mg | ORAL_TABLET | Freq: Every day | ORAL | Status: DC
  Administered 2022-11-11 – 2022-11-14 (×4): 40 mg via ORAL
  Filled 2022-11-11 (×4): qty 2

## 2022-11-11 MED ORDER — TRAZODONE HCL 50 MG PO TABS
25.0000 mg | ORAL_TABLET | Freq: Every evening | ORAL | Status: DC | PRN
  Filled 2022-11-11: qty 1

## 2022-11-11 MED ORDER — ENOXAPARIN SODIUM 40 MG/0.4ML IJ SOSY: 40.0000 mg | PREFILLED_SYRINGE | INTRAMUSCULAR | Status: DC

## 2022-11-11 MED ORDER — SODIUM CHLORIDE 0.9% FLUSH: 3.0000 mL | INTRAVENOUS | Status: DC | PRN

## 2022-11-11 MED ORDER — SODIUM CHLORIDE 0.9% FLUSH
3.0000 mL | Freq: Two times a day (BID) | INTRAVENOUS | Status: DC
  Administered 2022-11-11 – 2022-11-14 (×5): 3 mL via INTRAVENOUS

## 2022-11-11 MED ORDER — SODIUM CHLORIDE 0.9 % IV SOLN
2.0000 g | INTRAVENOUS | Status: DC
  Administered 2022-11-11 – 2022-11-13 (×3): 2 g via INTRAVENOUS
  Filled 2022-11-11 (×3): qty 20

## 2022-11-11 MED ORDER — APIXABAN 5 MG PO TABS
5.0000 mg | ORAL_TABLET | Freq: Two times a day (BID) | ORAL | Status: DC
  Administered 2022-11-11 – 2022-11-14 (×7): 5 mg via ORAL
  Filled 2022-11-11 (×7): qty 1

## 2022-11-11 MED ORDER — EZETIMIBE 10 MG PO TABS
10.0000 mg | ORAL_TABLET | Freq: Every day | ORAL | Status: DC
  Administered 2022-11-11: 10 mg via ORAL
  Filled 2022-11-11: qty 1

## 2022-11-11 MED ORDER — METRONIDAZOLE 1 % EX CREA: TOPICAL_CREAM | Freq: Every day | CUTANEOUS | Status: DC

## 2022-11-11 MED ORDER — ACETAMINOPHEN 650 MG RE SUPP: 650.0000 mg | Freq: Four times a day (QID) | RECTAL | Status: DC | PRN

## 2022-11-11 MED ORDER — PYRIDOSTIGMINE BROMIDE 60 MG PO TABS
60.0000 mg | ORAL_TABLET | Freq: Three times a day (TID) | ORAL | Status: DC
  Administered 2022-11-11 – 2022-11-14 (×9): 60 mg via ORAL
  Filled 2022-11-11 (×11): qty 1

## 2022-11-11 MED ORDER — SENNA 8.6 MG PO TABS
1.0000 | ORAL_TABLET | Freq: Two times a day (BID) | ORAL | Status: DC
  Administered 2022-11-13: 8.6 mg via ORAL
  Filled 2022-11-11 (×5): qty 1

## 2022-11-11 NOTE — Plan of Care (Signed)

## 2022-11-11 NOTE — Assessment & Plan Note (Signed)
Patient with colo-vesicular fistula. Has seen Dr. Maisie Fus for GS who referred patient to GI for colonoscopy prior to surgery. Colonoscopy was scheduled for last Wednesday, 11/09/22 but cancelled due to illness. Most likely source of E.Coli pyelnephritis which was diagnosed based on leukocytosis, positive U/A and CT revealing right perinephric stranding w/o hydronephrosis  Plan Ceftriaxone 2 g q 24  Follow up [facility] Sunday, 11/13/22 - if no fever and WBC down may complete Abx as outpatient.  Secure chat to Dr. Leonides Schanz, GI, so that colonoscopy for 11/16/22 can be canceled.

## 2022-11-11 NOTE — Subjective & Objective (Signed)
Faith Massey. 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 Monday she felt ill and developed a fever. She lost her appetite. She did a home Covid test that was negative. She continue to feel ill. She cancelled her scheduled colonoscopy for 8/21. Due to continued symptoms she went to [facility] for evaluation.

## 2022-11-11 NOTE — ED Notes (Signed)
[facility] here to transport patient.  

## 2022-11-11 NOTE — Telephone Encounter (Signed)
-----   Message from Imogene Burn sent at 11/11/2022  3:35 PM EDT ----- Molli Knock thank you for letting me know. Beth, let's delay her colonoscopy by a few more weeks. Thanks. ----- Message ----- From: Jacques Navy, MD Sent: 11/11/2022   2:29 PM EDT To: Imogene Burn, MD  Your patient has been admitted for pyelnephritis most likely due to colovesicular fistula. Unlikely to have completed antibiosis by time of colonoscopy scheduled for next week.

## 2022-11-11 NOTE — Progress Notes (Signed)
PHARMACY - PHYSICIAN COMMUNICATION CRITICAL VALUE ALERT - BLOOD CULTURE IDENTIFICATION (BCID)  Faith Massey is an 77 y.o. female who presented to [facility] on 11/10/2022 with a chief complaint of fevers and chills, concerns for pyelonephritis.   Assessment:  E. Coli bloodstream infection, likely urinary source.  Name of physician (or Provider) Contacted: Illene Regulus, MD  Current antibiotics: None scheduled - s/p ceftriaxone 1 g x1 yesterday @ ~2300.  Changes to prescribed antibiotics recommended: Start Ceftriaxone 2 g IV q24h.   Recommendations accepted by provider  Results for orders placed or performed during the hospital encounter of 11/10/22  Blood Culture ID Panel (Reflexed) (Collected: 11/10/2022  8:09 PM)  Result Value Ref Range   Enterococcus faecalis NOT DETECTED NOT DETECTED   Enterococcus Faecium NOT DETECTED NOT DETECTED   Listeria monocytogenes NOT DETECTED NOT DETECTED   Staphylococcus species NOT DETECTED NOT DETECTED   Staphylococcus aureus (BCID) NOT DETECTED NOT DETECTED   Staphylococcus epidermidis NOT DETECTED NOT DETECTED   Staphylococcus lugdunensis NOT DETECTED NOT DETECTED   Streptococcus species NOT DETECTED NOT DETECTED   Streptococcus agalactiae NOT DETECTED NOT DETECTED   Streptococcus pneumoniae NOT DETECTED NOT DETECTED   Streptococcus pyogenes NOT DETECTED NOT DETECTED   A.calcoaceticus-baumannii NOT DETECTED NOT DETECTED   Bacteroides fragilis NOT DETECTED NOT DETECTED   Enterobacterales DETECTED (A) NOT DETECTED   Enterobacter cloacae complex NOT DETECTED NOT DETECTED   Escherichia coli DETECTED (A) NOT DETECTED   Klebsiella aerogenes NOT DETECTED NOT DETECTED   Klebsiella oxytoca NOT DETECTED NOT DETECTED   Klebsiella pneumoniae NOT DETECTED NOT DETECTED   Proteus species NOT DETECTED NOT DETECTED   Salmonella species NOT DETECTED NOT DETECTED   Serratia marcescens NOT DETECTED NOT DETECTED   Haemophilus influenzae NOT DETECTED NOT DETECTED    Neisseria meningitidis NOT DETECTED NOT DETECTED   Pseudomonas aeruginosa NOT DETECTED NOT DETECTED   Stenotrophomonas maltophilia NOT DETECTED NOT DETECTED   Candida albicans NOT DETECTED NOT DETECTED   Candida auris NOT DETECTED NOT DETECTED   Candida glabrata NOT DETECTED NOT DETECTED   Candida krusei NOT DETECTED NOT DETECTED   Candida parapsilosis NOT DETECTED NOT DETECTED   Candida tropicalis NOT DETECTED NOT DETECTED   Cryptococcus neoformans/gattii NOT DETECTED NOT DETECTED   CTX-M ESBL NOT DETECTED NOT DETECTED   Carbapenem resistance IMP NOT DETECTED NOT DETECTED   Carbapenem resistance KPC NOT DETECTED NOT DETECTED   Carbapenem resistance NDM NOT DETECTED NOT DETECTED   Carbapenem resist OXA 48 LIKE NOT DETECTED NOT DETECTED   Carbapenem resistance VIM NOT DETECTED NOT DETECTED    Lora Paula, PharmD PGY-2 Infectious Diseases Pharmacy Resident 11/11/2022 1:16 PM

## 2022-11-11 NOTE — ED Notes (Signed)
Patient is resting comfortably. 

## 2022-11-11 NOTE — Assessment & Plan Note (Signed)
BP stable  Plan Continue home medications

## 2022-11-11 NOTE — Assessment & Plan Note (Signed)
Last A1C 4.3 09/12/22. PCP follows closely.  Plan Carb modified diet

## 2022-11-11 NOTE — Telephone Encounter (Signed)
Contacted the son, Karmynn Cote with information of the cancellation of the colonoscopy. Rescheduled to next opening 12/07/22 at 11:30 am. He thanks me for the call.

## 2022-11-11 NOTE — Progress Notes (Signed)
Patient completed NIF and VC x 3 each with good patient effort. NIF >-40 VC 1.54L

## 2022-11-11 NOTE — H&P (Signed)
History and Physical    Faith Massey ZOX:[id number] DOB: 1945/09/09 DOA: 11/10/2022  DOS: the patient was seen and examined on 11/10/2022  PCP: Copland, Gwenlyn Found, MD   Patient coming from:  transfer from [facility]  I have personally briefly reviewed patient's old medical records in [facility]  Faith Massey. 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 Monday she felt ill and developed a fever. She lost her appetite. She did a home Covid test that was negative. She continue to feel ill. She cancelled her scheduled colonoscopy for 8/21. Due to continued symptoms she went to [facility] for evaluation.    ED Course: T 98.5  147/73  HR 87  RR 20. ON admitting exam patient in no distress and feels better. Lab : glucose 118, albumin 3.4, serum lactic acid 1,8 to 2.3. WBC 16.5 w/ 78/7/15. U/A Hazy, LE small, many bacteria, 11-20 WBC/hpf. CT abd/pelvis revealed right perinephric stranding c/w pyelonephtritis. Accepted by [facility] in transfer to [facility] for treatment of pyelonephritis.  Review of Systems:  Review of Systems  Constitutional:  Positive for chills, fever and malaise/fatigue. Negative for weight loss.  HENT: Negative.    Eyes: Negative.   Respiratory: Negative.    Cardiovascular: Negative.   Gastrointestinal: Negative.   Genitourinary:  Positive for urgency. Negative for flank pain and hematuria.  Musculoskeletal:  Positive for myalgias.  Skin: Negative.   Neurological: Negative.   Endo/Heme/Allergies: Negative.   Psychiatric/Behavioral: Negative.      Past Medical History:  Diagnosis Date   Breast CA [facility])    History of radiation therapy    Chest 06/09/2022 - 07/21/2022 - Dr. Antony Blackbird   Hyperlipidemia    Hypertension    Myasthenia gravis (HCC)    Dx'd 03/2022   Osteoporosis 10/12/2012    Past Surgical History:  Procedure Laterality Date   ABDOMINAL HYSTERECTOMY  03/22/2003   BACK SURGERY  03/21/1998    BREAST SURGERY     MASTECTOMY Bilateral 03/21/1993   Soc Hx - married 30 years, divorced. Three sons, on daughter, 2 grandchildren. 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 Lives alone with her dog. On questioning she clearly state DN/DNI    reports that she has never smoked. She has never used smokeless tobacco. She reports that she does not currently use alcohol after a past usage of about 1.0 standard drink of alcohol per week. She reports that she does not use drugs.  Allergies  Allergen Reactions   Prednisone Shortness Of Breath, Swelling, Palpitations, Dermatitis and Hypertension    Patient reports having an intolerance to medication. Reports medication caused swelling and metallic taste in mouth.    Family History  Problem Relation Age of Onset   Stroke Mother    Hypertension Mother    Myasthenia gravis Mother    Hypertension Father    Colon cancer Neg Hx    Esophageal cancer Neg Hx     Prior to Admission medications   Medication Sig Start Date End Date Taking? Authorizing Provider  apixaban (ELIQUIS) 5 MG TABS tablet Take 1 tablet (5 mg total) by mouth 2 (two) times daily. 10/21/22 04/19/23 Yes Copland, Gwenlyn Found, MD  aspirin EC 81 MG tablet Take 1 tablet (81 mg total) by mouth daily. Swallow whole. 03/30/22  Yes Osvaldo Shipper, MD  Cholecalciferol (VITAMIN D-3) 5000 UNITS TABS Take 5,000 Units by mouth daily.   Yes [provider]  COLLAGEN PO  Take 1 Scoop by mouth daily.   Yes [provider]  ezetimibe (ZETIA) 10 MG tablet Take 1 tablet (10 mg total) by mouth daily. 03/30/22  Yes Osvaldo Shipper, MD  metronidazole (NORITATE) 1 % cream Apply topically daily. Use as needed for skin breakout 10/26/22  Yes Copland, Gwenlyn Found, MD  Multiple Vitamins-Minerals (MULTIVITAMIN WITH MINERALS) tablet Take 1 tablet by mouth daily.   Yes [provider]  nystatin (MYCOSTATIN) 100000 UNIT/ML suspension Take 5 mLs (500,000 Units total) by mouth 4 (four) times  daily. Patient taking differently: Take 5 mLs by mouth as needed (thrush). 06/06/22  Yes Levert Feinstein, MD  pyridostigmine (MESTINON) 60 MG tablet Take 1 tablet (60 mg total) by mouth 3 (three) times daily. 07/14/22  Yes Levert Feinstein, MD  traMADol (ULTRAM) 50 MG tablet Take 1 tablet (50 mg total) by mouth every 12 (twelve) hours as needed. 09/14/22  Yes Copland, Gwenlyn Found, MD  lisinopril (ZESTRIL) 40 MG tablet Take 1 tablet (40 mg total) by mouth daily. Patient not taking: Reported on 11/11/2022 06/14/22 03/31/23  Pearline Cables, MD    Physical Exam: Vitals:   11/11/22 0800 11/11/22 0957 11/11/22 1044 11/11/22 1240  BP: (!) 159/56 137/85  (!) 147/73  Pulse:  89 88 87  Resp:  18 17 20   Temp:  98 F (36.7 C)  98.5 F (36.9 C)  TempSrc:  Oral  Oral  SpO2:  96% 98% 95%  Weight:      Height:        Physical Exam Vitals and nursing note reviewed.  Constitutional:      General: She is not in acute distress.    Appearance: Normal appearance. She is normal weight. She is not ill-appearing.  HENT:     Head: Normocephalic and atraumatic.     Mouth/Throat:     Mouth: Mucous membranes are moist.     Pharynx: Oropharynx is clear. No oropharyngeal exudate.  Eyes:     Extraocular Movements: Extraocular movements intact.     Conjunctiva/sclera: Conjunctivae normal.     Pupils: Pupils are equal, round, and reactive to light.  Cardiovascular:     Rate and Rhythm: Normal rate and regular rhythm.     Pulses: Normal pulses.     Heart sounds: Normal heart sounds.  Pulmonary:     Effort: Pulmonary effort is normal.     Breath sounds: Normal breath sounds.  Abdominal:     Palpations: Abdomen is soft.     Tenderness: There is right CVA tenderness.  Musculoskeletal:        General: Normal range of motion.     Cervical back: Normal range of motion.     Right lower leg: No edema.     Left lower leg: No edema.  Skin:    General: Skin is warm and dry.  Neurological:     General: No focal deficit  present.     Mental Status: She is alert and oriented to person, place, and time.  Psychiatric:        Mood and Affect: Mood normal.        Behavior: Behavior normal.      Labs on Admission: I have personally reviewed following labs and imaging studies  CBC: Recent Labs  Lab 11/10/22 1854  WBC 16.5*  NEUTROABS 12.9*  HGB 13.3  HCT 40.3  MCV 97.1  PLT 205   Basic Metabolic Panel: Recent Labs  Lab 11/10/22 1854  NA 134*  K 4.1  CL 99  CO2 21*  GLUCOSE 118*  BUN 17  CREATININE 0.77  CALCIUM 8.9   GFR: Estimated Creatinine Clearance: 43.6 mL/min (by C-G formula based on SCr of 0.77 mg/dL). Liver Function Tests: Recent Labs  Lab 11/10/22 1854  AST 29  ALT 26  ALKPHOS 67  BILITOT 1.0  PROT 7.6  ALBUMIN 3.4*   No results for input(s): "LIPASE", "AMYLASE" in the last 168 hours. No results for input(s): "AMMONIA" in the last 168 hours. Coagulation Profile: No results for input(s): "INR", "PROTIME" in the last 168 hours. Cardiac Enzymes: No results for input(s): "CKTOTAL", "CKMB", "CKMBINDEX", "TROPONINI" in the last 168 hours. BNP (last 3 results) No results for input(s): "PROBNP" in the last 8760 hours. HbA1C: No results for input(s): "HGBA1C" in the last 72 hours. CBG: No results for input(s): "GLUCAP" in the last 168 hours. Lipid Profile: No results for input(s): "CHOL", "HDL", "LDLCALC", "TRIG", "CHOLHDL", "LDLDIRECT" in the last 72 hours. Thyroid Function Tests: No results for input(s): "TSH", "T4TOTAL", "FREET4", "T3FREE", "THYROIDAB" in the last 72 hours. Anemia Panel: No results for input(s): "VITAMINB12", "FOLATE", "FERRITIN", "TIBC", "IRON", "RETICCTPCT" in the last 72 hours. Urine analysis:    Component Value Date/Time   COLORURINE YELLOW 11/10/2022 2151   APPEARANCEUR HAZY (A) 11/10/2022 2151   LABSPEC 1.010 11/10/2022 2151   PHURINE 6.0 11/10/2022 2151   GLUCOSEU NEGATIVE 11/10/2022 2151   HGBUR MODERATE (A) 11/10/2022 2151   BILIRUBINUR  NEGATIVE 11/10/2022 2151   KETONESUR NEGATIVE 11/10/2022 2151   PROTEINUR 100 (A) 11/10/2022 2151   NITRITE POSITIVE (A) 11/10/2022 2151   LEUKOCYTESUR SMALL (A) 11/10/2022 2151    Radiological Exams on Admission: I have personally reviewed images CT ABDOMEN PELVIS W CONTRAST  Result Date: 11/10/2022 CLINICAL DATA:  Left lower quadrant abdominal pain. Evaluate for colovesicular fistula. Fever. EXAM: CT ABDOMEN AND PELVIS WITH CONTRAST TECHNIQUE: Multidetector CT imaging of the abdomen and pelvis was performed using the standard protocol following bolus administration of intravenous contrast. RADIATION DOSE REDUCTION: This exam was performed according to the departmental dose-optimization program which includes automated exposure control, adjustment of the mA and/or kV according to patient size and/or use of iterative reconstruction technique. CONTRAST:  OMNIPAQUE IOHEXOL 300 MG/ML  SOLN COMPARISON:  CT abdomen and pelvis 09/20/2022 FINDINGS: Lower chest: No acute abnormality. Hepatobiliary: No focal liver abnormality is seen. No gallstones, gallbladder wall thickening, or biliary dilatation. Pancreas: Unremarkable. No pancreatic ductal dilatation or surrounding inflammatory changes. Spleen: There are scattered subcentimeter rounded hypodensities throughout the spleen which are unchanged from prior. The spleen is normal in size. Adrenals/Urinary Tract: There are patchy areas of hypoattenuation throughout the right renal parenchyma, new from prior. There is new mild right perinephric fat stranding. There is also wall enhancement of the proximal right ureter. There is no hydronephrosis. The adrenal glands and left kidney are within normal limits. No bladder air identified. There some mild focal wall thickening of the posterior left bladder as it abuts the diverticulum, unchanged from prior. Stomach/Bowel: Focal diverticulum abuts the posterior right bladder and vaginal cuff and has a similar appearance  to prior. No surrounding inflammation. There is no bowel obstruction or free air. There is diffuse colonic diverticulosis. The appendix is within normal limits. There is a small hiatal hernia. Vascular/Lymphatic: Aortic atherosclerosis. No enlarged abdominal or pelvic lymph nodes. Reproductive: Status post hysterectomy. No adnexal masses. Other: No abdominal wall hernia or abnormality. No abdominopelvic ascites. Musculoskeletal: Degenerative changes affect the spine IMPRESSION: 1. Findings compatible with right-sided  pyelonephritis and ureteritis. 2. Again seen is a small amount of bladder wall thickening on the left with adjacent prominent diverticulum and vaginal cuff. There is no surrounding inflammation and there is no air within the bladder to suggest definitive colovesicular fistula at this time. 3. Colonic diverticulosis without evidence for diverticulitis. 4. Stable subcentimeter rounded hypodensities throughout the spleen, indeterminate. Aortic Atherosclerosis (ICD10-I70.0). Electronically Signed   By: Darliss Cheney M.D.   On: 11/10/2022 22:18   DG Chest Portable 1 View  Result Date: 11/10/2022 CLINICAL DATA:  Fever of unknown origin. EXAM: PORTABLE CHEST 1 VIEW COMPARISON:  September 20, 2022 FINDINGS: The heart size and mediastinal contours are within normal limits. Mild, chronic linear scarring and/or atelectasis is seen within the mid right lung and left lung base. No pleural effusion or pneumothorax is identified. There is mild dextroscoliosis of mid to lower thoracic spine with multilevel degenerative changes. IMPRESSION: Mild, chronic linear scarring and/or atelectasis within the mid right lung and left lung base. Electronically Signed   By: Aram Candela M.D.   On: 11/10/2022 20:21    EKG: I have personally reviewed EKG: no EKG done this visit  Assessment/Plan Principal Problem:   Pyelonephritis Active Problems:   Prediabetes   HTN (hypertension)   Myasthenic syndrome (HCC)     Assessment and Plan: * Pyelonephritis Patient with colo-vesicular fistula. Has seen Dr. Maisie Fus for GS who referred patient to GI for colonoscopy prior to surgery. Colonoscopy was scheduled for last Wednesday, 11/09/22 but cancelled due to illness. Most likely source of E.Coli pyelnephritis which was diagnosed based on leukocytosis, positive U/A and CT revealing right perinephric stranding w/o hydronephrosis  Plan Ceftriaxone 2 g q 24  Follow up [facility] Sunday, 11/13/22 - if no fever and WBC down may complete Abx as outpatient.  Secure chat to Dr. Leonides Schanz, GI, so that colonoscopy for 11/16/22 can be canceled.  Prediabetes Last A1C 4.3 09/12/22. PCP follows closely.  Plan Carb modified diet  Myasthenic syndrome (HCC) Established diagnosis. No flare of symptoms. Follows with neurology  Plan Continue home regimen  HTN (hypertension) BP stable  Plan Continue home medications       DVT prophylaxis: Lovenox Code Status: DNR/DNI(Do NOT Intubate) Family Communication: spoke with Kipp Brood, daughter. Gave update and tx plan.   Disposition Plan: home when stable  Consults called: none  Admission status: Inpatient, Med-Surg   Illene Regulus, MD [facility] 11/11/2022, 2:58 PM

## 2022-11-11 NOTE — Progress Notes (Signed)
Pt completed the following with good pt effort.   NIF - 28  VC 1.45 L

## 2022-11-11 NOTE — ED Notes (Signed)
[facility] called for transport @ 06:31am  No ETA.Marland KitchenMarland KitchenMarland Kitchen

## 2022-11-11 NOTE — ED Notes (Signed)
Report called to floor nurse

## 2022-11-11 NOTE — Assessment & Plan Note (Signed)
Established diagnosis. No flare of symptoms. Follows with neurology  Plan Continue home regimen

## 2022-11-12 DIAGNOSIS — N12 Tubulo-interstitial nephritis, not specified as acute or chronic: Secondary | ICD-10-CM | POA: Diagnosis not present

## 2022-11-12 MED ORDER — LOPERAMIDE HCL 2 MG PO CAPS
2.0000 mg | ORAL_CAPSULE | Freq: Four times a day (QID) | ORAL | Status: DC | PRN
  Filled 2022-11-12: qty 1

## 2022-11-12 NOTE — Plan of Care (Signed)

## 2022-11-12 NOTE — Progress Notes (Signed)
Patient performed NIF and VC with good effort  NIF -30  VC 1.9L

## 2022-11-12 NOTE — Progress Notes (Signed)
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  On presentation, she had leukocytosis, UA suggestive of UTI and CT of abdomen and pelvis revealed right perinephric stranding consistent with pyelonephritis.  She was started on IV fluids and antibiotics.  Assessment & Plan:   Acute pyelonephritis: Present on admission E. coli bacteremia -Imaging consistent with possible acute right-sided pyelonephritis.  Has E. coli bacteremia.  Follow sensitivities.  Continue Rocephin -Appetite improving but still poor.  Feels slightly better.  Leukocytosis -No labs today.  Repeat a.m. labs  Hyponatremia -Mild.  Repeat a.m. labs.  Essential hypertension Blood pressure currently stable.  Continue lisinopril  History of recent diagnosis of PE Continue apixaban  History of myasthenia gravis -Continue pyridostigmine   History of recent diagnosis of colovesical fistula -Outpatient follow-up with general surgery and GI.  Colonoscopy next week has been rescheduled by Dr. Derek Mound office.  History of thymoma resection on 06/12/2022 -Outpatient follow-up with oncology/cardiothoracic surgery    DVT prophylaxis: Lovenox Code Status: Full Family Communication: None at bedside Disposition Plan: Status is: Inpatient Remains inpatient appropriate because: Of severity of illness    Consultants: None  Procedures: None  Antimicrobials: Rocephin from 11/11/2022 onwards   Subjective: Patient seen and examined at bedside.  Feels slightly better with no fever this morning.  Oral intake improving but still not that good.  Denies any  current nausea, vomiting or abdominal pain.  Objective: Vitals:   11/11/22 1934 11/11/22 2024 11/11/22 2320 11/12/22 0806  BP: (!) 140/73  130/63 132/68  Pulse: (!) 112  (!) 101 86  Resp: 18  20 20   Temp: 98.2 F (36.8 C) 99 F (37.2 C) 98.4 F (36.9 C) 97.8 F (36.6 C)  TempSrc: Oral Oral Oral Oral  SpO2: 100%  94%   Weight:      Height:        Intake/Output Summary (Last 24 hours) at 11/12/2022 1148 Last data filed at 11/12/2022 0807 Gross per 24 hour  Intake 700 ml  Output --  Net 700 ml   Filed Weights   11/10/22 1759  Weight: 52.3 kg    Examination:  General exam: Appears calm and comfortable.  On room air. Respiratory system: Bilateral decreased breath sounds at bases Cardiovascular system: S1 & S2 heard, mild intermittent tachycardia present  gastrointestinal system: Abdomen is nondistended, soft and nontender. Normal bowel sounds heard. Extremities: No cyanosis, clubbing, edema  Central nervous system: Alert and oriented. No focal neurological deficits. Moving extremities Skin: No rashes, lesions or ulcers Psychiatry: Judgement and insight appear normal. Mood & affect appropriate.     Data Reviewed: I have personally reviewed following labs and imaging studies  CBC: Recent Labs  Lab 11/10/22 1854  WBC 16.5*  NEUTROABS 12.9*  HGB 13.3  HCT 40.3  MCV 97.1  PLT 205   Basic Metabolic Panel: Recent Labs  Lab 11/10/22 1854  NA 134*  K 4.1  CL 99  CO2 21*  GLUCOSE 118*  BUN 17  CREATININE 0.77  CALCIUM 8.9   GFR: Estimated Creatinine Clearance: 43.6 mL/min (by C-G formula based on SCr of 0.77 mg/dL). Liver Function Tests: Recent Labs  Lab 11/10/22 1854  AST 29  ALT 26  ALKPHOS 67  BILITOT 1.0  PROT 7.6  ALBUMIN 3.4*   No results for input(s): "LIPASE", "AMYLASE" in the last 168 hours. No results for input(s): "AMMONIA" in the last 168 hours. Coagulation Profile: No results for input(s): "INR", "PROTIME" in the last 168  hours. Cardiac Enzymes: No results for input(s): "CKTOTAL", "CKMB", "CKMBINDEX", "TROPONINI" in the last 168 hours. BNP (last 3 results) No results for input(s): "PROBNP" in the last 8760 hours. HbA1C: No results for input(s): "HGBA1C" in the last 72 hours. CBG: No results for input(s): "GLUCAP" in the last 168 hours. Lipid Profile: No results for input(s): "CHOL", "HDL", "LDLCALC", "TRIG", "CHOLHDL", "LDLDIRECT" in the last 72 hours. Thyroid Function Tests: No results for input(s): "TSH", "T4TOTAL", "FREET4", "T3FREE", "THYROIDAB" in the last 72 hours. Anemia Panel: No results for input(s): "VITAMINB12", "FOLATE", "FERRITIN", "TIBC", "IRON", "RETICCTPCT" in the last 72 hours. Sepsis Labs: Recent Labs  Lab 11/10/22 1856 11/10/22 2151  LATICACIDVEN 1.8 2.3*    Recent Results (from the past 240 hour(s))  Blood culture (routine x 2)     Status: None (Preliminary result)   Collection Time: 11/10/22  8:07 PM   Specimen: BLOOD RIGHT HAND  Result Value Ref Range Status   Specimen Description   Final    BLOOD RIGHT HAND Performed at [facility], [address], [place], [place] [address]    Special Requests   Final    BOTTLES DRAWN AEROBIC AND ANAEROBIC Blood Culture adequate volume Performed at [facility], [address] [place], [place] [address]    Culture  Setup Time   Final    GRAM NEGATIVE RODS IN BOTH AEROBIC AND ANAEROBIC BOTTLES CRITICAL VALUE NOTED.  VALUE IS CONSISTENT WITH PREVIOUSLY REPORTED AND CALLED VALUE.    Culture   Final    GRAM NEGATIVE RODS IDENTIFICATION TO FOLLOW Performed at [facility], [address], [place], [place] [address]    Report Status PENDING  Incomplete  Resp panel by RT-PCR (RSV, Flu A&B, Covid) Anterior Nasal Swab     Status: None   Collection Time: 11/10/22  8:09 PM   Specimen: Anterior Nasal Swab  Result Value Ref Range Status   SARS Coronavirus 2 by RT PCR NEGATIVE NEGATIVE Final    Comment:  (NOTE) SARS-CoV-2 target nucleic acids are NOT DETECTED.  The SARS-CoV-2 RNA is generally detectable in upper respiratory specimens during the acute phase of infection. The lowest concentration of SARS-CoV-2 viral copies this assay can detect is 138 copies/mL. A negative result does not preclude SARS-Cov-2 infection and should not be used as the sole basis for treatment or other patient management decisions. A negative result may occur with  improper specimen collection/handling, submission of specimen other than nasopharyngeal swab, presence of viral mutation(s) within the areas targeted by this assay, and inadequate number of viral copies(<138 copies/mL). A negative result must be combined with clinical observations, patient history, and epidemiological information. The expected result is Negative.  Fact Sheet for Patients:  BloggerCourse.com  Fact Sheet for Healthcare Providers:  SeriousBroker.it  This test is no t yet approved or cleared by the [place] FDA and  has been authorized for detection and/or diagnosis of SARS-CoV-2 by FDA under an Emergency Use Authorization (EUA). This EUA will remain  in effect (meaning this test can be used) for the duration of the COVID-19 declaration under Section 564(b)(1) of the Act, 21 U.S.C.section 360bbb-3(b)(1), unless  the authorization is terminated  or revoked sooner.       Influenza A by PCR NEGATIVE NEGATIVE Final   Influenza B by PCR NEGATIVE NEGATIVE Final    Comment: (NOTE) The Xpert Xpress SARS-CoV-2/FLU/RSV plus assay is intended as an aid in the diagnosis of influenza from Nasopharyngeal swab specimens and should not be used as a sole basis for treatment. Nasal washings and aspirates are unacceptable for Xpert Xpress SARS-CoV-2/FLU/RSV testing.  Fact Sheet for Patients: BloggerCourse.com  Fact Sheet for Healthcare  Providers: SeriousBroker.it  This test is not yet approved or cleared by the [place] FDA and has been authorized for detection and/or diagnosis of SARS-CoV-2 by FDA under an Emergency Use Authorization (EUA). This EUA will remain in effect (meaning this test can be used) for the duration of the COVID-19 declaration under Section 564(b)(1) of the Act, 21 U.S.C. section 360bbb-3(b)(1), unless the authorization is terminated or revoked.     Resp Syncytial Virus by PCR NEGATIVE NEGATIVE Final    Comment: (NOTE) Fact Sheet for Patients: BloggerCourse.com  Fact Sheet for Healthcare Providers: SeriousBroker.it  This test is not yet approved or cleared by the [place] FDA and has been authorized for detection and/or diagnosis of SARS-CoV-2 by FDA under an Emergency Use Authorization (EUA). This EUA will remain in effect (meaning this test can be used) for the duration of the COVID-19 declaration under Section 564(b)(1) of the Act, 21 U.S.C. section 360bbb-3(b)(1), unless the authorization is terminated or revoked.  Performed at [facility], [address] [place], [place] [address]   Blood culture (routine x 2)     Status: Abnormal (Preliminary result)   Collection Time: 11/10/22  8:09 PM   Specimen: BLOOD  Result Value Ref Range Status   Specimen Description   Final    BLOOD BLOOD RIGHT ARM Performed at [facility], [address] [place], [place] [address]    Special Requests   Final    BOTTLES DRAWN AEROBIC AND ANAEROBIC Blood Culture adequate volume Performed at [facility], [address] [place], [place] [address]    Culture  Setup Time   Final    GRAM NEGATIVE RODS ANAEROBIC BOTTLE ONLY CRITICAL RESULT CALLED TO, READ BACK BY AND VERIFIED WITH: PHARMD B.AGEE AT 1307 ON 11/11/2022 BY T.SAAD.    Culture (A)  Final    ESCHERICHIA  COLI SUSCEPTIBILITIES TO FOLLOW Performed at [facility], [address], [place], [place] [address]    Report Status PENDING  Incomplete  Blood Culture ID Panel (Reflexed)     Status: Abnormal   Collection Time: 11/10/22  8:09 PM  Result Value Ref Range Status   Enterococcus faecalis NOT DETECTED NOT DETECTED Final   Enterococcus Faecium NOT DETECTED NOT DETECTED Final   Listeria monocytogenes NOT DETECTED NOT DETECTED Final   Staphylococcus species NOT DETECTED NOT DETECTED Final   Staphylococcus aureus (BCID) NOT DETECTED NOT DETECTED Final   Staphylococcus epidermidis NOT DETECTED NOT DETECTED Final   Staphylococcus lugdunensis NOT DETECTED NOT DETECTED Final   Streptococcus species NOT DETECTED NOT DETECTED Final   Streptococcus agalactiae NOT DETECTED NOT DETECTED Final   Streptococcus pneumoniae NOT DETECTED NOT DETECTED Final   Streptococcus pyogenes NOT DETECTED NOT DETECTED Final   A.calcoaceticus-baumannii NOT DETECTED NOT DETECTED Final   Bacteroides fragilis NOT DETECTED NOT DETECTED Final   Enterobacterales DETECTED (A) NOT DETECTED Final    Comment: Enterobacterales represent a large order  of gram negative bacteria, not a single organism. CRITICAL RESULT CALLED TO, READ BACK BY AND VERIFIED WITH: PHARMD B.AGEE AT 1307 ON 11/11/2022 BY T.SAAD.    Enterobacter cloacae complex NOT DETECTED NOT DETECTED Final   Escherichia coli DETECTED (A) NOT DETECTED Final    Comment: CRITICAL RESULT CALLED TO, READ BACK BY AND VERIFIED WITH: PHARMD B.AGEE AT 1307 ON 11/11/2022 BY T.SAAD.    Klebsiella aerogenes NOT DETECTED NOT DETECTED Final   Klebsiella oxytoca NOT DETECTED NOT DETECTED Final   Klebsiella pneumoniae NOT DETECTED NOT DETECTED Final   Proteus species NOT DETECTED NOT DETECTED Final   Salmonella species NOT DETECTED NOT DETECTED Final   Serratia marcescens NOT DETECTED NOT DETECTED Final   Haemophilus influenzae NOT DETECTED NOT DETECTED Final   Neisseria  meningitidis NOT DETECTED NOT DETECTED Final   Pseudomonas aeruginosa NOT DETECTED NOT DETECTED Final   Stenotrophomonas maltophilia NOT DETECTED NOT DETECTED Final   Candida albicans NOT DETECTED NOT DETECTED Final   Candida auris NOT DETECTED NOT DETECTED Final   Candida glabrata NOT DETECTED NOT DETECTED Final   Candida krusei NOT DETECTED NOT DETECTED Final   Candida parapsilosis NOT DETECTED NOT DETECTED Final   Candida tropicalis NOT DETECTED NOT DETECTED Final   Cryptococcus neoformans/gattii NOT DETECTED NOT DETECTED Final   CTX-M ESBL NOT DETECTED NOT DETECTED Final   Carbapenem resistance IMP NOT DETECTED NOT DETECTED Final   Carbapenem resistance KPC NOT DETECTED NOT DETECTED Final   Carbapenem resistance NDM NOT DETECTED NOT DETECTED Final   Carbapenem resist OXA 48 LIKE NOT DETECTED NOT DETECTED Final   Carbapenem resistance VIM NOT DETECTED NOT DETECTED Final    Comment: Performed at [facility], [address] [place], [place] [address]         Radiology Studies: CT ABDOMEN PELVIS W CONTRAST  Result Date: 11/10/2022 CLINICAL DATA:  Left lower quadrant abdominal pain. Evaluate for colovesicular fistula. Fever. EXAM: CT ABDOMEN AND PELVIS WITH CONTRAST TECHNIQUE: Multidetector CT imaging of the abdomen and pelvis was performed using the standard protocol following bolus administration of intravenous contrast. RADIATION DOSE REDUCTION: This exam was performed according to the departmental dose-optimization program which includes automated exposure control, adjustment of the mA and/or kV according to patient size and/or use of iterative reconstruction technique. CONTRAST:  OMNIPAQUE IOHEXOL 300 MG/ML  SOLN COMPARISON:  CT abdomen and pelvis 09/20/2022 FINDINGS: Lower chest: No acute abnormality. Hepatobiliary: No focal liver abnormality is seen. No gallstones, gallbladder wall thickening, or biliary dilatation. Pancreas: Unremarkable. No pancreatic ductal dilatation  or surrounding inflammatory changes. Spleen: There are scattered subcentimeter rounded hypodensities throughout the spleen which are unchanged from prior. The spleen is normal in size. Adrenals/Urinary Tract: There are patchy areas of hypoattenuation throughout the right renal parenchyma, new from prior. There is new mild right perinephric fat stranding. There is also wall enhancement of the proximal right ureter. There is no hydronephrosis. The adrenal glands and left kidney are within normal limits. No bladder air identified. There some mild focal wall thickening of the posterior left bladder as it abuts the diverticulum, unchanged from prior. Stomach/Bowel: Focal diverticulum abuts the posterior right bladder and vaginal cuff and has a similar appearance to prior. No surrounding inflammation. There is no bowel obstruction or free air. There is diffuse colonic diverticulosis. The appendix is within normal limits. There is a small hiatal hernia. Vascular/Lymphatic: Aortic atherosclerosis. No enlarged abdominal or pelvic lymph nodes. Reproductive: Status post hysterectomy. No adnexal masses. Other: No  abdominal wall hernia or abnormality. No abdominopelvic ascites. Musculoskeletal: Degenerative changes affect the spine IMPRESSION: 1. Findings compatible with right-sided pyelonephritis and ureteritis. 2. Again seen is a small amount of bladder wall thickening on the left with adjacent prominent diverticulum and vaginal cuff. There is no surrounding inflammation and there is no air within the bladder to suggest definitive colovesicular fistula at this time. 3. Colonic diverticulosis without evidence for diverticulitis. 4. Stable subcentimeter rounded hypodensities throughout the spleen, indeterminate. Aortic Atherosclerosis (ICD10-I70.0). Electronically Signed   By: Darliss Cheney M.D.   On: 11/10/2022 22:18   DG Chest Portable 1 View  Result Date: 11/10/2022 CLINICAL DATA:  Fever of unknown origin. EXAM: PORTABLE  CHEST 1 VIEW COMPARISON:  September 20, 2022 FINDINGS: The heart size and mediastinal contours are within normal limits. Mild, chronic linear scarring and/or atelectasis is seen within the mid right lung and left lung base. No pleural effusion or pneumothorax is identified. There is mild dextroscoliosis of mid to lower thoracic spine with multilevel degenerative changes. IMPRESSION: Mild, chronic linear scarring and/or atelectasis within the mid right lung and left lung base. Electronically Signed   By: Aram Candela M.D.   On: 11/10/2022 20:21        Scheduled Meds:  apixaban  5 mg Oral BID   lisinopril  40 mg Oral Daily   pyridostigmine  60 mg Oral TID   senna  1 tablet Oral BID   sodium chloride flush  3 mL Intravenous Q12H   Continuous Infusions:  sodium chloride     cefTRIAXone (ROCEPHIN)  IV Stopped (11/11/22 1600)          Glade Lloyd, MD [facility] 11/12/2022, 11:48 AM

## 2022-11-12 NOTE — Progress Notes (Signed)
Pt completed NIF/VC with great effort NIF: >-40 cmH2O FVC: 1.65 L

## 2022-11-13 DIAGNOSIS — N12 Tubulo-interstitial nephritis, not specified as acute or chronic: Secondary | ICD-10-CM | POA: Diagnosis not present

## 2022-11-13 LAB — CBC
HCT: 32.2 % — ABNORMAL LOW (ref 36.0–46.0)
Hemoglobin: 10.6 g/dL — ABNORMAL LOW (ref 12.0–15.0)
MCH: 31.3 pg (ref 26.0–34.0)
MCHC: 32.9 g/dL (ref 30.0–36.0)
MCV: 95 fL (ref 80.0–100.0)
Platelets: 193 10*3/uL (ref 150–400)
RBC: 3.39 MIL/uL — ABNORMAL LOW (ref 3.87–5.11)
RDW: 14 % (ref 11.5–15.5)
WBC: 9.3 10*3/uL (ref 4.0–10.5)
nRBC: 0 % (ref 0.0–0.2)

## 2022-11-13 LAB — COMPREHENSIVE METABOLIC PANEL
ALT: 25 U/L (ref 0–44)
AST: 19 U/L (ref 15–41)
Albumin: 2.2 g/dL — ABNORMAL LOW (ref 3.5–5.0)
Alkaline Phosphatase: 54 U/L (ref 38–126)
Anion gap: 11 (ref 5–15)
BUN: 15 mg/dL (ref 8–23)
CO2: 21 mmol/L — ABNORMAL LOW (ref 22–32)
Calcium: 8.3 mg/dL — ABNORMAL LOW (ref 8.9–10.3)
Chloride: 109 mmol/L (ref 98–111)
Creatinine, Ser: 0.72 mg/dL (ref 0.44–1.00)
GFR, Estimated: >60 mL/min (ref >60)
Glucose, Bld: 115 mg/dL — ABNORMAL HIGH (ref 70–99)
Potassium: 3.1 mmol/L — ABNORMAL LOW (ref 3.5–5.1)
Sodium: 141 mmol/L (ref 135–145)
Total Bilirubin: 0.3 mg/dL (ref 0.3–1.2)
Total Protein: 5.4 g/dL — ABNORMAL LOW (ref 6.5–8.1)

## 2022-11-13 LAB — MAGNESIUM: Magnesium: 1.9 mg/dL (ref 1.7–2.4)

## 2022-11-13 LAB — CULTURE, BLOOD (ROUTINE X 2)
Special Requests: ADEQUATE
Special Requests: ADEQUATE

## 2022-11-13 MED ORDER — POTASSIUM CHLORIDE CRYS ER 20 MEQ PO TBCR
40.0000 meq | EXTENDED_RELEASE_TABLET | ORAL | Status: AC
  Administered 2022-11-13 (×2): 40 meq via ORAL
  Filled 2022-11-13 (×2): qty 2

## 2022-11-13 NOTE — Progress Notes (Signed)
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  On presentation, she had leukocytosis, UA suggestive of UTI and CT of abdomen and pelvis revealed right perinephric stranding consistent with pyelonephritis.  She was started on IV fluids and antibiotics.  Assessment & Plan:   Acute pyelonephritis: Present on admission E. coli bacteremia -Imaging consistent with possible acute right-sided pyelonephritis.  Has E. coli bacteremia.  Follow sensitivities.  Continue Rocephin -Appetite improving.  Feels slightly better. -No temperature spikes over the last 24 hours.  Leukocytosis -Improved  Hyponatremia -Improved  Essential hypertension -Blood pressure currently stable.  Continue lisinopril  History of recent diagnosis of PE -Continue apixaban  History of myasthenia gravis -Continue pyridostigmine   History of recent diagnosis of colovesical fistula -Outpatient follow-up with general surgery and GI.  Colonoscopy next week has been rescheduled by Dr. Derek Mound office.  History of thymoma resection on 06/12/2022 -Outpatient follow-up with oncology/cardiothoracic surgery    DVT prophylaxis: Lovenox Code Status: Full Family Communication: None at bedside Disposition Plan: Status is: Inpatient Remains inpatient appropriate because: Of severity of illness.  Possible discharge in 1 to 2 days if continues to improve and remain medically stable.    Consultants: None  Procedures: None  Antimicrobials: Rocephin from 11/11/2022 onwards   Subjective: Patient seen and examined at bedside.  Feels better but still  having intermittent abdominal pain.  Appetite slightly improving.  No fever or vomiting reported. Objective: Vitals:   11/12/22 2353 11/13/22 0328 11/13/22 0634 11/13/22 0736  BP: 128/63 (!) 150/74  (!) 156/70  Pulse: 78 84  73  Resp: 17 19  20   Temp: 98.2 F (36.8 C) 98.6 F (37 C)  98.5 F (36.9 C)  TempSrc: Oral Oral  Oral  SpO2: 97% 96%  99%  Weight:   52.1 kg   Height:        Intake/Output Summary (Last 24 hours) at 11/13/2022 1028 Last data filed at 11/12/2022 2009 Gross per 24 hour  Intake 480 ml  Output 0 ml  Net 480 ml   Filed Weights   11/10/22 1759 11/13/22 0634  Weight: 52.3 kg 52.1 kg    Examination:  General: Currently on room air.  No distress ENT/neck: No thyromegaly.  JVD is not elevated  respiratory: Decreased breath sounds at bases bilaterally with some crackles; no wheezing  CVS: S1-S2 heard, rate controlled currently Abdominal: Soft, nontender, slightly distended; no organomegaly, bowel sounds are heard Extremities: Trace lower extremity edema; no cyanosis  CNS: Awake and alert.  No focal neurologic deficit.  Moves extremities Lymph: No obvious lymphadenopathy Skin: No obvious ecchymosis/lesions  psych: Affect, judgment and mood are normal  musculoskeletal: No obvious joint swelling/deformity     Data Reviewed: I have personally reviewed following labs and imaging studies  CBC: Recent Labs  Lab 11/10/22 1854 11/13/22 0407  WBC 16.5* 9.3  NEUTROABS 12.9*  --   HGB 13.3 10.6*  HCT 40.3 32.2*  MCV 97.1 95.0  PLT 205 193   Basic Metabolic Panel: Recent Labs  Lab 11/10/22 1854 11/13/22 0407  NA 134* 141  K 4.1 3.1*  CL 99 109  CO2 21* 21*  GLUCOSE 118* 115*  BUN 17 15  CREATININE 0.77 0.72  CALCIUM 8.9 8.3*  MG  --  1.9   GFR: Estimated Creatinine Clearance: 43.5 mL/min (by C-G formula based on SCr of 0.72 mg/dL). Liver Function Tests: Recent Labs  Lab 11/10/22 1854 11/13/22 0407  AST 29 19  ALT 26 25  ALKPHOS 67 54   BILITOT 1.0 0.3  PROT 7.6 5.4*  ALBUMIN 3.4* 2.2*   No results for input(s): "LIPASE", "AMYLASE" in the last 168 hours. No results for input(s): "AMMONIA" in the last 168 hours. Coagulation Profile: No results for input(s): "INR", "PROTIME" in the last 168 hours. Cardiac Enzymes: No results for input(s): "CKTOTAL", "CKMB", "CKMBINDEX", "TROPONINI" in the last 168 hours. BNP (last 3 results) No results for input(s): "PROBNP" in the last 8760 hours. HbA1C: No results for input(s): "HGBA1C" in the last 72 hours. CBG: No results for input(s): "GLUCAP" in the last 168 hours. Lipid Profile: No results for input(s): "CHOL", "HDL", "LDLCALC", "TRIG", "CHOLHDL", "LDLDIRECT" in the last 72 hours. Thyroid Function Tests: No results for input(s): "TSH", "T4TOTAL", "FREET4", "T3FREE", "THYROIDAB" in the last 72 hours. Anemia Panel: No results for input(s): "VITAMINB12", "FOLATE", "FERRITIN", "TIBC", "IRON", "RETICCTPCT" in the last 72 hours. Sepsis Labs: Recent Labs  Lab 11/10/22 1856 11/10/22 2151  LATICACIDVEN 1.8 2.3*    Recent Results (from the past 240 hour(s))  Blood culture (routine x 2)     Status: Abnormal   Collection Time: 11/10/22  8:07 PM   Specimen: BLOOD RIGHT HAND  Result Value Ref Range Status   Specimen Description   Final    BLOOD RIGHT HAND Performed at [facility], [address] [place], [place] [address]    Special Requests   Final    BOTTLES DRAWN AEROBIC AND ANAEROBIC Blood Culture adequate volume Performed at [facility], [address] [place], [place] [address]    Culture  Setup Time   Final    GRAM NEGATIVE RODS IN BOTH AEROBIC AND ANAEROBIC BOTTLES CRITICAL VALUE NOTED.  VALUE IS CONSISTENT WITH PREVIOUSLY REPORTED AND CALLED VALUE.    Culture (A)  Final    ESCHERICHIA COLI SUSCEPTIBILITIES PERFORMED ON PREVIOUS CULTURE WITHIN THE LAST 5 DAYS. Performed at [facility], [address] [place], [place] [address]     Report Status 11/13/2022 FINAL  Final  Resp panel by RT-PCR (RSV, Flu A&B, Covid) Anterior Nasal Swab     Status: None   Collection Time: 11/10/22  8:09 PM   Specimen: Anterior Nasal Swab  Result Value Ref Range Status   SARS Coronavirus 2 by RT PCR NEGATIVE NEGATIVE Final    Comment: (NOTE) SARS-CoV-2 target nucleic acids are NOT DETECTED.  The SARS-CoV-2 RNA is generally detectable in upper respiratory specimens during the acute phase of infection. The lowest concentration of SARS-CoV-2 viral copies this assay can detect is 138 copies/mL. A negative result does not preclude SARS-Cov-2 infection and should not be used as the sole basis for treatment or other patient management decisions. A negative result may occur with  improper specimen collection/handling, submission of specimen other than nasopharyngeal swab, presence of viral mutation(s) within the areas targeted by this assay, and inadequate number of viral copies(<138 copies/mL). A negative result must be combined with clinical observations, patient history, and epidemiological information. The expected result is Negative.  Fact Sheet for Patients:  BloggerCourse.com  Fact Sheet for Healthcare Providers:  SeriousBroker.it  This test is no t yet  approved or cleared by the [place] and  has been authorized for detection and/or diagnosis of SARS-CoV-2 by FDA under an Emergency Use Authorization (EUA). This EUA will remain  in effect (meaning this test can be used) for the duration of the COVID-19 declaration under Section 564(b)(1) of the Act, 21 U.S.C.section 360bbb-3(b)(1), unless the authorization is terminated  or revoked sooner.       Influenza A by PCR NEGATIVE NEGATIVE Final   Influenza B by PCR NEGATIVE NEGATIVE Final    Comment: (NOTE) The Xpert Xpress SARS-CoV-2/FLU/RSV plus assay is intended as an aid in the diagnosis of influenza from Nasopharyngeal  swab specimens and should not be used as a sole basis for treatment. Nasal washings and aspirates are unacceptable for Xpert Xpress SARS-CoV-2/FLU/RSV testing.  Fact Sheet for Patients: BloggerCourse.com  Fact Sheet for Healthcare Providers: SeriousBroker.it  This test is not yet approved or cleared by the [place] FDA and has been authorized for detection and/or diagnosis of SARS-CoV-2 by FDA under an Emergency Use Authorization (EUA). This EUA will remain in effect (meaning this test can be used) for the duration of the COVID-19 declaration under Section 564(b)(1) of the Act, 21 U.S.C. section 360bbb-3(b)(1), unless the authorization is terminated or revoked.     Resp Syncytial Virus by PCR NEGATIVE NEGATIVE Final    Comment: (NOTE) Fact Sheet for Patients: BloggerCourse.com  Fact Sheet for Healthcare Providers: SeriousBroker.it  This test is not yet approved or cleared by the [place] FDA and has been authorized for detection and/or diagnosis of SARS-CoV-2 by FDA under an Emergency Use Authorization (EUA). This EUA will remain in effect (meaning this test can be used) for the duration of the COVID-19 declaration under Section 564(b)(1) of the Act, 21 U.S.C. section 360bbb-3(b)(1), unless the authorization is terminated or revoked.  Performed at [facility], [address] [place], [place] [address]   Blood culture (routine x 2)     Status: Abnormal   Collection Time: 11/10/22  8:09 PM   Specimen: BLOOD  Result Value Ref Range Status   Specimen Description   Final    BLOOD BLOOD RIGHT ARM Performed at [facility], [address] [place], [place] [address]    Special Requests   Final    BOTTLES DRAWN AEROBIC AND ANAEROBIC Blood Culture adequate volume Performed at [facility], [address] [place], [place] [address]     Culture  Setup Time   Final    GRAM NEGATIVE RODS ANAEROBIC BOTTLE ONLY CRITICAL RESULT CALLED TO, READ BACK BY AND VERIFIED WITH: PHARMD B.AGEE AT 1307 ON 11/11/2022 BY T.SAAD. Performed at [facility], [address], [place], [place] [address]    Culture ESCHERICHIA COLI (A)  Final   Report Status 11/13/2022 FINAL  Final   Organism ID, Bacteria ESCHERICHIA COLI  Final      Susceptibility   Escherichia coli - MIC*    AMPICILLIN >=32 RESISTANT Resistant     CEFEPIME <=0.12 SENSITIVE Sensitive     CEFTAZIDIME <=1 SENSITIVE Sensitive     CEFTRIAXONE <=0.25 SENSITIVE Sensitive     CIPROFLOXACIN <=0.25 SENSITIVE Sensitive     GENTAMICIN <=1 SENSITIVE Sensitive     IMIPENEM <=0.25 SENSITIVE Sensitive     TRIMETH/SULFA <=20 SENSITIVE Sensitive     AMPICILLIN/SULBACTAM 16 INTERMEDIATE Intermediate     PIP/TAZO <=4 SENSITIVE Sensitive     * ESCHERICHIA COLI  Blood Culture ID  Panel (Reflexed)     Status: Abnormal   Collection Time: 11/10/22  8:09 PM  Result Value Ref Range Status   Enterococcus faecalis NOT DETECTED NOT DETECTED Final   Enterococcus Faecium NOT DETECTED NOT DETECTED Final   Listeria monocytogenes NOT DETECTED NOT DETECTED Final   Staphylococcus species NOT DETECTED NOT DETECTED Final   Staphylococcus aureus (BCID) NOT DETECTED NOT DETECTED Final   Staphylococcus epidermidis NOT DETECTED NOT DETECTED Final   Staphylococcus lugdunensis NOT DETECTED NOT DETECTED Final   Streptococcus species NOT DETECTED NOT DETECTED Final   Streptococcus agalactiae NOT DETECTED NOT DETECTED Final   Streptococcus pneumoniae NOT DETECTED NOT DETECTED Final   Streptococcus pyogenes NOT DETECTED NOT DETECTED Final   A.calcoaceticus-baumannii NOT DETECTED NOT DETECTED Final   Bacteroides fragilis NOT DETECTED NOT DETECTED Final   Enterobacterales DETECTED (A) NOT DETECTED Final    Comment: Enterobacterales represent a large order of gram negative bacteria, not a single  organism. CRITICAL RESULT CALLED TO, READ BACK BY AND VERIFIED WITH: PHARMD B.AGEE AT 1307 ON 11/11/2022 BY T.SAAD.    Enterobacter cloacae complex NOT DETECTED NOT DETECTED Final   Escherichia coli DETECTED (A) NOT DETECTED Final    Comment: CRITICAL RESULT CALLED TO, READ BACK BY AND VERIFIED WITH: PHARMD B.AGEE AT 1307 ON 11/11/2022 BY T.SAAD.    Klebsiella aerogenes NOT DETECTED NOT DETECTED Final   Klebsiella oxytoca NOT DETECTED NOT DETECTED Final   Klebsiella pneumoniae NOT DETECTED NOT DETECTED Final   Proteus species NOT DETECTED NOT DETECTED Final   Salmonella species NOT DETECTED NOT DETECTED Final   Serratia marcescens NOT DETECTED NOT DETECTED Final   Haemophilus influenzae NOT DETECTED NOT DETECTED Final   Neisseria meningitidis NOT DETECTED NOT DETECTED Final   Pseudomonas aeruginosa NOT DETECTED NOT DETECTED Final   Stenotrophomonas maltophilia NOT DETECTED NOT DETECTED Final   Candida albicans NOT DETECTED NOT DETECTED Final   Candida auris NOT DETECTED NOT DETECTED Final   Candida glabrata NOT DETECTED NOT DETECTED Final   Candida krusei NOT DETECTED NOT DETECTED Final   Candida parapsilosis NOT DETECTED NOT DETECTED Final   Candida tropicalis NOT DETECTED NOT DETECTED Final   Cryptococcus neoformans/gattii NOT DETECTED NOT DETECTED Final   CTX-M ESBL NOT DETECTED NOT DETECTED Final   Carbapenem resistance IMP NOT DETECTED NOT DETECTED Final   Carbapenem resistance KPC NOT DETECTED NOT DETECTED Final   Carbapenem resistance NDM NOT DETECTED NOT DETECTED Final   Carbapenem resist OXA 48 LIKE NOT DETECTED NOT DETECTED Final   Carbapenem resistance VIM NOT DETECTED NOT DETECTED Final    Comment: Performed at [facility], [address], [place], [place] [address]         Radiology Studies: No results found.      Scheduled Meds:  apixaban  5 mg Oral BID   lisinopril  40 mg Oral Daily   potassium chloride  40 mEq Oral Q4H   pyridostigmine  60 mg  Oral TID   senna  1 tablet Oral BID   sodium chloride flush  3 mL Intravenous Q12H   Continuous Infusions:  sodium chloride     cefTRIAXone (ROCEPHIN)  IV 2 g (11/12/22 1400)          Glade Lloyd, MD [facility] 11/13/2022, 10:28 AM

## 2022-11-13 NOTE — Progress Notes (Signed)
Patient with great effort on NIF and VC.  NIF: -40 VC: 2.0L

## 2022-11-13 NOTE — Plan of Care (Signed)

## 2022-11-13 NOTE — Progress Notes (Signed)
NIF -40 VC 1.98L Good patient effort.

## 2022-11-14 ENCOUNTER — Encounter: Payer: Self-pay | Admitting: Internal Medicine

## 2022-11-14 ENCOUNTER — Encounter: Payer: Self-pay | Admitting: Family Medicine

## 2022-11-14 ENCOUNTER — Ambulatory Visit: Payer: Medicare HMO

## 2022-11-14 DIAGNOSIS — N12 Tubulo-interstitial nephritis, not specified as acute or chronic: Secondary | ICD-10-CM | POA: Diagnosis not present

## 2022-11-14 LAB — CBC WITH DIFFERENTIAL/PLATELET
Abs Immature Granulocytes: 0.09 10*3/uL — ABNORMAL HIGH (ref 0.00–0.07)
Basophils Absolute: 0 10*3/uL (ref 0.0–0.1)
Basophils Relative: 1 %
Eosinophils Absolute: 0.1 10*3/uL (ref 0.0–0.5)
Eosinophils Relative: 2 %
HCT: 36.4 % (ref 36.0–46.0)
Hemoglobin: 11.6 g/dL — ABNORMAL LOW (ref 12.0–15.0)
Immature Granulocytes: 1 %
Lymphocytes Relative: 12 %
Lymphs Abs: 1 10*3/uL (ref 0.7–4.0)
MCH: 31.1 pg (ref 26.0–34.0)
MCHC: 31.9 g/dL (ref 30.0–36.0)
MCV: 97.6 fL (ref 80.0–100.0)
Monocytes Absolute: 0.7 10*3/uL (ref 0.1–1.0)
Monocytes Relative: 9 %
Neutro Abs: 6.7 10*3/uL (ref 1.7–7.7)
Neutrophils Relative %: 75 %
Platelets: 258 10*3/uL (ref 150–400)
RBC: 3.73 MIL/uL — ABNORMAL LOW (ref 3.87–5.11)
RDW: 14.1 % (ref 11.5–15.5)
WBC: 8.7 10*3/uL (ref 4.0–10.5)
nRBC: 0 % (ref 0.0–0.2)

## 2022-11-14 LAB — BASIC METABOLIC PANEL
Anion gap: 10 (ref 5–15)
BUN: 13 mg/dL (ref 8–23)
CO2: 20 mmol/L — ABNORMAL LOW (ref 22–32)
Calcium: 8.8 mg/dL — ABNORMAL LOW (ref 8.9–10.3)
Chloride: 110 mmol/L (ref 98–111)
Creatinine, Ser: 0.75 mg/dL (ref 0.44–1.00)
GFR, Estimated: >60 mL/min (ref >60)
Glucose, Bld: 143 mg/dL — ABNORMAL HIGH (ref 70–99)
Potassium: 4.3 mmol/L (ref 3.5–5.1)
Sodium: 140 mmol/L (ref 135–145)

## 2022-11-14 LAB — MAGNESIUM: Magnesium: 2 mg/dL (ref 1.7–2.4)

## 2022-11-14 MED ORDER — CEPHALEXIN 500 MG PO CAPS: 500.0000 mg | ORAL_CAPSULE | Freq: Three times a day (TID) | ORAL | 0 refills | Status: AC

## 2022-11-14 MED ORDER — SODIUM CHLORIDE 0.9 % IV SOLN
2.0000 g | Freq: Every day | INTRAVENOUS | Status: DC
  Administered 2022-11-14: 2 g via INTRAVENOUS
  Filled 2022-11-14: qty 20

## 2022-11-14 MED ORDER — NYSTATIN 100000 UNIT/ML MT SUSP: 5.0000 mL | OROMUCOSAL | Status: DC | PRN

## 2022-11-14 MED ORDER — CEPHALEXIN 500 MG PO CAPS: 500.0000 mg | ORAL_CAPSULE | Freq: Three times a day (TID) | ORAL | 0 refills | Status: DC

## 2022-11-14 NOTE — Plan of Care (Signed)

## 2022-11-14 NOTE — Progress Notes (Signed)
Discharge instructions reviewed with pt.   Copy of instructions given to pt. Pt informed her script sent to her pharmacy for pick up.  Pt to be d/c'd via wheelchair with belongings, with friend picking up at main entrance.              To be escorted by staff.   Annice Needy, RN SWOT

## 2022-11-14 NOTE — Care Management Important Message (Signed)
Important Message  Patient Details  Name: Faith Massey MRN: [id number] Date of Birth: [birth year]   Medicare Important Message Given:  Yes     Renie Ora 11/14/2022, 8:38 AM

## 2022-11-14 NOTE — Discharge Summary (Signed)
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 On presentation, she had leukocytosis, UA suggestive of UTI and CT of abdomen and pelvis revealed right perinephric stranding consistent with pyelonephritis. She was started on IV fluids and antibiotics.  During the hospitalization, her condition has improved.  She had E. coli bacteremia: Sensitivities are now available.  She is currently on IV Rocephin.  She feels much better and feels okay to go home today.  She will be discharged home today on oral Keflex for 9 more days to finish total 2-week course of therapy.  Discharge Diagnoses:   Acute pyelonephritis: Present on admission E. coli bacteremia -Imaging consistent with possible acute right-sided pyelonephritis.  Has E. coli bacteremia: Sensitivities are now available..  Currently on Rocephin -No temperature spikes over the last 48 hours. -She feels much better and feels okay to go home today.  She will be discharged home today on oral Keflex for 9 more days to finish total 2-week course of therapy.   Leukocytosis -Improved   Hyponatremia -Improved   Essential hypertension -Blood pressure currently stable.  Continue  lisinopril   History of recent diagnosis of PE -Continue apixaban   History of myasthenia gravis -Continue pyridostigmine    History of recent diagnosis of colovesical fistula -Outpatient follow-up with general surgery and GI.  Colonoscopy next week has been rescheduled by Dr. Derek Mound office.   History of thymoma resection on 06/12/2022 -Outpatient follow-up with oncology/cardiothoracic surgery    Discharge Instructions  Discharge Instructions     Diet - low sodium heart healthy   Complete by: As directed    Increase activity slowly   Complete by: As directed       Allergies as of 11/14/2022       Reactions   Prednisone Shortness Of Breath, Swelling, Palpitations, Dermatitis, Hypertension   Patient reports having an intolerance to medication. Reports medication caused swelling and metallic taste in mouth.        Medication List     STOP taking these medications    traMADol 50 MG tablet Commonly known as: ULTRAM       TAKE these medications    apixaban 5 MG Tabs tablet Commonly known as: ELIQUIS Take 1 tablet (5 mg total) by mouth 2 (two) times daily.   aspirin EC 81 MG tablet Take 1 tablet (81 mg total) by mouth daily. Swallow whole.   cephALEXin 500 MG capsule Commonly known as: KEFLEX Take 1 capsule (500 mg total) by mouth 3 (three) times daily for 9 days. Start taking on: November 15, 2022   COLLAGEN PO Take 1 Scoop by mouth daily.   ezetimibe 10 MG tablet Commonly known as: ZETIA Take 1 tablet (10 mg total) by mouth daily.   lisinopril 40 MG tablet Commonly known as: ZESTRIL Take 1  tablet (40 mg total) by mouth daily.   metronidazole 1 % cream Commonly known as: NORITATE Apply topically daily. Use as needed for skin breakout   multivitamin with minerals tablet Take 1 tablet by mouth daily.   nystatin 100000 UNIT/ML suspension Commonly known as: MYCOSTATIN Take 5 mLs (500,000 Units total) by mouth as needed (thrush).   pyridostigmine  60 MG tablet Commonly known as: Mestinon Take 1 tablet (60 mg total) by mouth 3 (three) times daily.   Vitamin D-3 125 MCG (5000 UT) Tabs Take 5,000 Units by mouth daily.        Follow-up Information     Copland, Gwenlyn Found, MD. Schedule an appointment as soon as possible for a visit in 1 week(s).   Specialty: Family Medicine Contact information: [address] [place] [address] [phone]                Allergies  Allergen Reactions   Prednisone Shortness Of Breath, Swelling, Palpitations, Dermatitis and Hypertension    Patient reports having an intolerance to medication. Reports medication caused swelling and metallic taste in mouth.    Consultations: None   Procedures/Studies: CT ABDOMEN PELVIS W CONTRAST  Result Date: 11/10/2022 CLINICAL DATA:  Left lower quadrant abdominal pain. Evaluate for colovesicular fistula. Fever. EXAM: CT ABDOMEN AND PELVIS WITH CONTRAST TECHNIQUE: Multidetector CT imaging of the abdomen and pelvis was performed using the standard protocol following bolus administration of intravenous contrast. RADIATION DOSE REDUCTION: This exam was performed according to the departmental dose-optimization program which includes automated exposure control, adjustment of the mA and/or kV according to patient size and/or use of iterative reconstruction technique. CONTRAST:  OMNIPAQUE IOHEXOL 300 MG/ML  SOLN COMPARISON:  CT abdomen and pelvis 09/20/2022 FINDINGS: Lower chest: No acute abnormality. Hepatobiliary: No focal liver abnormality is seen. No gallstones, gallbladder wall thickening, or biliary dilatation. Pancreas: Unremarkable. No pancreatic ductal dilatation or surrounding inflammatory changes. Spleen: There are scattered subcentimeter rounded hypodensities throughout the spleen which are unchanged from prior. The spleen is normal in size. Adrenals/Urinary Tract: There are patchy areas of hypoattenuation throughout the right  renal parenchyma, new from prior. There is new mild right perinephric fat stranding. There is also wall enhancement of the proximal right ureter. There is no hydronephrosis. The adrenal glands and left kidney are within normal limits. No bladder air identified. There some mild focal wall thickening of the posterior left bladder as it abuts the diverticulum, unchanged from prior. Stomach/Bowel: Focal diverticulum abuts the posterior right bladder and vaginal cuff and has a similar appearance to prior. No surrounding inflammation. There is no bowel obstruction or free air. There is diffuse colonic diverticulosis. The appendix is within normal limits. There is a small hiatal hernia. Vascular/Lymphatic: Aortic atherosclerosis. No enlarged abdominal or pelvic lymph nodes. Reproductive: Status post hysterectomy. No adnexal masses. Other: No abdominal wall hernia or abnormality. No abdominopelvic ascites. Musculoskeletal: Degenerative changes affect the spine IMPRESSION: 1. Findings compatible with right-sided pyelonephritis and ureteritis. 2. Again seen is a small amount of bladder wall thickening on the left with adjacent prominent diverticulum and vaginal cuff. There is no surrounding inflammation and there is no air within the bladder to suggest definitive colovesicular fistula at this time. 3. Colonic diverticulosis without evidence for diverticulitis. 4. Stable subcentimeter rounded hypodensities throughout the spleen, indeterminate. Aortic Atherosclerosis (ICD10-I70.0). Electronically Signed   By: Darliss Cheney M.D.   On: 11/10/2022 22:18   DG Chest Portable 1 View  Result Date: 11/10/2022  CLINICAL DATA:  Fever of unknown origin. EXAM: PORTABLE CHEST 1 VIEW COMPARISON:  September 20, 2022 FINDINGS: The heart size and mediastinal contours are within normal limits. Mild, chronic linear scarring and/or atelectasis is seen within the mid right lung and left lung base. No pleural effusion or pneumothorax is identified.  There is mild dextroscoliosis of mid to lower thoracic spine with multilevel degenerative changes. IMPRESSION: Mild, chronic linear scarring and/or atelectasis within the mid right lung and left lung base. Electronically Signed   By: Aram Candela M.D.   On: 11/10/2022 20:21   CT Chest W Contrast  Result Date: 10/21/2022 CLINICAL DATA:  Thymoma. Status post resection. Restaging. * Tracking Code: BO * EXAM: CT CHEST WITH CONTRAST TECHNIQUE: Multidetector CT imaging of the chest was performed during intravenous contrast administration. RADIATION DOSE REDUCTION: This exam was performed according to the departmental dose-optimization program which includes automated exposure control, adjustment of the mA and/or kV according to patient size and/or use of iterative reconstruction technique. CONTRAST:  75mL OMNIPAQUE IOHEXOL 300 MG/ML  SOLN COMPARISON:  Chest CTA 07/14/2022 FINDINGS: Cardiovascular: The heart size is normal. No substantial pericardial effusion. Mild atherosclerotic calcification is noted in the wall of the thoracic aorta. Mediastinum/Nodes: No mediastinal lymphadenopathy. No abnormal or suspicious soft tissue in the anterior mediastinum There is no hilar lymphadenopathy. Small hiatal hernia. The esophagus has normal imaging features. There is no axillary lymphadenopathy. Lungs/Pleura: Bandlike chronic atelectasis or scarring noted in the right upper, middle and lower lobes. Atelectasis/scarring noted inferior lingula and posterior left lower lobe. No suspicious pulmonary nodule or mass. No focal airspace consolidation no pulmonary edema or pleural effusion. Upper Abdomen: Visualized portion of the upper abdomen is unremarkable. Musculoskeletal: No worrisome lytic or sclerotic osseous abnormality. IMPRESSION: 1. No evidence for recurrent or metastatic disease in the chest. 2. Bandlike chronic atelectasis or scarring in both lungs. 3. Small hiatal hernia. 4.  Aortic Atherosclerosis (ICD10-I70.0).  Electronically Signed   By: Kennith Center M.D.   On: 10/21/2022 12:39      Subjective: Patient seen and examined at bedside.  Feels okay to go home today.  Denies worsening fever, vomiting, abdominal pain.  Discharge Exam: Vitals:   11/14/22 0332 11/14/22 0745  BP: (!) 163/77 (!) 142/77  Pulse: 70 63  Resp:  18  Temp: 97.7 F (36.5 C) (!) 97.5 F (36.4 C)  SpO2: 97% 97%    General: Pt is alert, awake, not in acute distress.  On room air. Cardiovascular: rate controlled, S1/S2 + Respiratory: bilateral decreased breath sounds at bases Abdominal: Soft, NT, ND, bowel sounds + Extremities: no edema, no cyanosis    The results of significant diagnostics from this hospitalization (including imaging, microbiology, ancillary and laboratory) are listed below for reference.     Microbiology: Recent Results (from the past 240 hour(s))  Blood culture (routine x 2)     Status: Abnormal   Collection Time: 11/10/22  8:07 PM   Specimen: BLOOD RIGHT HAND  Result Value Ref Range Status   Specimen Description   Final    BLOOD RIGHT HAND Performed at [facility], [address], [place], [place] [address]    Special Requests   Final    BOTTLES DRAWN AEROBIC AND ANAEROBIC Blood Culture adequate volume Performed at [facility], [address] [place], [place] [address]    Culture  Setup Time   Final    GRAM NEGATIVE RODS IN BOTH AEROBIC AND ANAEROBIC BOTTLES CRITICAL VALUE  NOTED.  VALUE IS CONSISTENT WITH PREVIOUSLY REPORTED AND CALLED VALUE.    Culture (A)  Final    ESCHERICHIA COLI SUSCEPTIBILITIES PERFORMED ON PREVIOUS CULTURE WITHIN THE LAST 5 DAYS. Performed at [facility], [address], [place], [place] [address]    Report Status 11/13/2022 FINAL  Final  Resp panel by RT-PCR (RSV, Flu A&B, Covid) Anterior Nasal Swab     Status: None   Collection Time: 11/10/22  8:09 PM   Specimen: Anterior Nasal Swab  Result Value Ref Range Status   SARS  Coronavirus 2 by RT PCR NEGATIVE NEGATIVE Final    Comment: (NOTE) SARS-CoV-2 target nucleic acids are NOT DETECTED.  The SARS-CoV-2 RNA is generally detectable in upper respiratory specimens during the acute phase of infection. The lowest concentration of SARS-CoV-2 viral copies this assay can detect is 138 copies/mL. A negative result does not preclude SARS-Cov-2 infection and should not be used as the sole basis for treatment or other patient management decisions. A negative result may occur with  improper specimen collection/handling, submission of specimen other than nasopharyngeal swab, presence of viral mutation(s) within the areas targeted by this assay, and inadequate number of viral copies(<138 copies/mL). A negative result must be combined with clinical observations, patient history, and epidemiological information. The expected result is Negative.  Fact Sheet for Patients:  BloggerCourse.com  Fact Sheet for Healthcare Providers:  SeriousBroker.it  This test is no t yet approved or cleared by the [place] FDA and  has been authorized for detection and/or diagnosis of SARS-CoV-2 by FDA under an Emergency Use Authorization (EUA). This EUA will remain  in effect (meaning this test can be used) for the duration of the COVID-19 declaration under Section 564(b)(1) of the Act, 21 U.S.C.section 360bbb-3(b)(1), unless the authorization is terminated  or revoked sooner.       Influenza A by PCR NEGATIVE NEGATIVE Final   Influenza B by PCR NEGATIVE NEGATIVE Final    Comment: (NOTE) The Xpert Xpress SARS-CoV-2/FLU/RSV plus assay is intended as an aid in the diagnosis of influenza from Nasopharyngeal swab specimens and should not be used as a sole basis for treatment. Nasal washings and aspirates are unacceptable for Xpert Xpress SARS-CoV-2/FLU/RSV testing.  Fact Sheet for  Patients: BloggerCourse.com  Fact Sheet for Healthcare Providers: SeriousBroker.it  This test is not yet approved or cleared by the [place] FDA and has been authorized for detection and/or diagnosis of SARS-CoV-2 by FDA under an Emergency Use Authorization (EUA). This EUA will remain in effect (meaning this test can be used) for the duration of the COVID-19 declaration under Section 564(b)(1) of the Act, 21 U.S.C. section 360bbb-3(b)(1), unless the authorization is terminated or revoked.     Resp Syncytial Virus by PCR NEGATIVE NEGATIVE Final    Comment: (NOTE) Fact Sheet for Patients: BloggerCourse.com  Fact Sheet for Healthcare Providers: SeriousBroker.it  This test is not yet approved or cleared by the [place] FDA and has been authorized for detection and/or diagnosis of SARS-CoV-2 by FDA under an Emergency Use Authorization (EUA). This EUA will remain in effect (meaning this test can be used) for the duration of the COVID-19 declaration under Section 564(b)(1) of the Act, 21 U.S.C. section 360bbb-3(b)(1), unless the authorization is terminated or revoked.  Performed at [facility], [address] [place], [place] [address]   Blood culture (routine x 2)     Status: Abnormal   Collection Time: 11/10/22  8:09 PM   Specimen:  BLOOD  Result Value Ref Range Status   Specimen Description   Final    BLOOD BLOOD RIGHT ARM Performed at [facility], [address] [place], [place] [address]    Special Requests   Final    BOTTLES DRAWN AEROBIC AND ANAEROBIC Blood Culture adequate volume Performed at [facility], [address] [place], [place] [address]    Culture  Setup Time   Final    GRAM NEGATIVE RODS ANAEROBIC BOTTLE ONLY CRITICAL RESULT CALLED TO, READ BACK BY AND VERIFIED WITH: PHARMD B.AGEE AT 1307 ON 11/11/2022 BY  T.SAAD. Performed at [facility], [address], [place], [place] [address]    Culture ESCHERICHIA COLI (A)  Final   Report Status 11/13/2022 FINAL  Final   Organism ID, Bacteria ESCHERICHIA COLI  Final      Susceptibility   Escherichia coli - MIC*    AMPICILLIN >=32 RESISTANT Resistant     CEFEPIME <=0.12 SENSITIVE Sensitive     CEFTAZIDIME <=1 SENSITIVE Sensitive     CEFTRIAXONE <=0.25 SENSITIVE Sensitive     CIPROFLOXACIN <=0.25 SENSITIVE Sensitive     GENTAMICIN <=1 SENSITIVE Sensitive     IMIPENEM <=0.25 SENSITIVE Sensitive     TRIMETH/SULFA <=20 SENSITIVE Sensitive     AMPICILLIN/SULBACTAM 16 INTERMEDIATE Intermediate     PIP/TAZO <=4 SENSITIVE Sensitive     * ESCHERICHIA COLI  Blood Culture ID Panel (Reflexed)     Status: Abnormal   Collection Time: 11/10/22  8:09 PM  Result Value Ref Range Status   Enterococcus faecalis NOT DETECTED NOT DETECTED Final   Enterococcus Faecium NOT DETECTED NOT DETECTED Final   Listeria monocytogenes NOT DETECTED NOT DETECTED Final   Staphylococcus species NOT DETECTED NOT DETECTED Final   Staphylococcus aureus (BCID) NOT DETECTED NOT DETECTED Final   Staphylococcus epidermidis NOT DETECTED NOT DETECTED Final   Staphylococcus lugdunensis NOT DETECTED NOT DETECTED Final   Streptococcus species NOT DETECTED NOT DETECTED Final   Streptococcus agalactiae NOT DETECTED NOT DETECTED Final   Streptococcus pneumoniae NOT DETECTED NOT DETECTED Final   Streptococcus pyogenes NOT DETECTED NOT DETECTED Final   A.calcoaceticus-baumannii NOT DETECTED NOT DETECTED Final   Bacteroides fragilis NOT DETECTED NOT DETECTED Final   Enterobacterales DETECTED (A) NOT DETECTED Final    Comment: Enterobacterales represent a large order of gram negative bacteria, not a single organism. CRITICAL RESULT CALLED TO, READ BACK BY AND VERIFIED WITH: PHARMD B.AGEE AT 1307 ON 11/11/2022 BY T.SAAD.    Enterobacter cloacae complex NOT DETECTED NOT DETECTED Final    Escherichia coli DETECTED (A) NOT DETECTED Final    Comment: CRITICAL RESULT CALLED TO, READ BACK BY AND VERIFIED WITH: PHARMD B.AGEE AT 1307 ON 11/11/2022 BY T.SAAD.    Klebsiella aerogenes NOT DETECTED NOT DETECTED Final   Klebsiella oxytoca NOT DETECTED NOT DETECTED Final   Klebsiella pneumoniae NOT DETECTED NOT DETECTED Final   Proteus species NOT DETECTED NOT DETECTED Final   Salmonella species NOT DETECTED NOT DETECTED Final   Serratia marcescens NOT DETECTED NOT DETECTED Final   Haemophilus influenzae NOT DETECTED NOT DETECTED Final   Neisseria meningitidis NOT DETECTED NOT DETECTED Final   Pseudomonas aeruginosa NOT DETECTED NOT DETECTED Final   Stenotrophomonas maltophilia NOT DETECTED NOT DETECTED Final   Candida albicans NOT DETECTED NOT DETECTED Final   Candida auris NOT DETECTED NOT DETECTED Final   Candida glabrata NOT DETECTED NOT DETECTED Final   Candida krusei NOT DETECTED NOT DETECTED Final  Candida parapsilosis NOT DETECTED NOT DETECTED Final   Candida tropicalis NOT DETECTED NOT DETECTED Final   Cryptococcus neoformans/gattii NOT DETECTED NOT DETECTED Final   CTX-M ESBL NOT DETECTED NOT DETECTED Final   Carbapenem resistance IMP NOT DETECTED NOT DETECTED Final   Carbapenem resistance KPC NOT DETECTED NOT DETECTED Final   Carbapenem resistance NDM NOT DETECTED NOT DETECTED Final   Carbapenem resist OXA 48 LIKE NOT DETECTED NOT DETECTED Final   Carbapenem resistance VIM NOT DETECTED NOT DETECTED Final    Comment: Performed at [facility], [address], [place], [place] [address]     Labs: BNP (last 3 results) Recent Labs    07/14/22 1556  BNP 123.7*   Basic Metabolic Panel: Recent Labs  Lab 11/10/22 1854 11/13/22 0407  NA 134* 141  K 4.1 3.1*  CL 99 109  CO2 21* 21*  GLUCOSE 118* 115*  BUN 17 15  CREATININE 0.77 0.72  CALCIUM 8.9 8.3*  MG  --  1.9   Liver Function Tests: Recent Labs  Lab 11/10/22 1854 11/13/22 0407  AST 29 19  ALT  26 25  ALKPHOS 67 54  BILITOT 1.0 0.3  PROT 7.6 5.4*  ALBUMIN 3.4* 2.2*   No results for input(s): "LIPASE", "AMYLASE" in the last 168 hours. No results for input(s): "AMMONIA" in the last 168 hours. CBC: Recent Labs  Lab 11/10/22 1854 11/13/22 0407 11/14/22 0836  WBC 16.5* 9.3 8.7  NEUTROABS 12.9*  --  6.7  HGB 13.3 10.6* 11.6*  HCT 40.3 32.2* 36.4  MCV 97.1 95.0 97.6  PLT 205 193 258   Cardiac Enzymes: No results for input(s): "CKTOTAL", "CKMB", "CKMBINDEX", "TROPONINI" in the last 168 hours. BNP: Invalid input(s): "POCBNP" CBG: No results for input(s): "GLUCAP" in the last 168 hours. D-Dimer No results for input(s): "DDIMER" in the last 72 hours. Hgb A1c No results for input(s): "HGBA1C" in the last 72 hours. Lipid Profile No results for input(s): "CHOL", "HDL", "LDLCALC", "TRIG", "CHOLHDL", "LDLDIRECT" in the last 72 hours. Thyroid function studies No results for input(s): "TSH", "T4TOTAL", "T3FREE", "THYROIDAB" in the last 72 hours.  Invalid input(s): "FREET3" Anemia work up No results for input(s): "VITAMINB12", "FOLATE", "FERRITIN", "TIBC", "IRON", "RETICCTPCT" in the last 72 hours. Urinalysis    Component Value Date/Time   COLORURINE YELLOW 11/10/2022 2151   APPEARANCEUR HAZY (A) 11/10/2022 2151   LABSPEC 1.010 11/10/2022 2151   PHURINE 6.0 11/10/2022 2151   GLUCOSEU NEGATIVE 11/10/2022 2151   HGBUR MODERATE (A) 11/10/2022 2151   BILIRUBINUR NEGATIVE 11/10/2022 2151   KETONESUR NEGATIVE 11/10/2022 2151   PROTEINUR 100 (A) 11/10/2022 2151   NITRITE POSITIVE (A) 11/10/2022 2151   LEUKOCYTESUR SMALL (A) 11/10/2022 2151   Sepsis Labs Recent Labs  Lab 11/10/22 1854 11/13/22 0407 11/14/22 0836  WBC 16.5* 9.3 8.7   Microbiology Recent Results (from the past 240 hour(s))  Blood culture (routine x 2)     Status: Abnormal   Collection Time: 11/10/22  8:07 PM   Specimen: BLOOD RIGHT HAND  Result Value Ref Range Status   Specimen Description   Final     BLOOD RIGHT HAND Performed at [facility], [address], [place], [place] [address]    Special Requests   Final    BOTTLES DRAWN AEROBIC AND ANAEROBIC Blood Culture adequate volume Performed at [facility], [address] [place], [place] [address]    Culture  Setup Time   Final  GRAM NEGATIVE RODS IN BOTH AEROBIC AND ANAEROBIC BOTTLES CRITICAL VALUE NOTED.  VALUE IS CONSISTENT WITH PREVIOUSLY REPORTED AND CALLED VALUE.    Culture (A)  Final    ESCHERICHIA COLI SUSCEPTIBILITIES PERFORMED ON PREVIOUS CULTURE WITHIN THE LAST 5 DAYS. Performed at [facility], [address] [place], [place] [address]    Report Status 11/13/2022 FINAL  Final  Resp panel by RT-PCR (RSV, Flu A&B, Covid) Anterior Nasal Swab     Status: None   Collection Time: 11/10/22  8:09 PM   Specimen: Anterior Nasal Swab  Result Value Ref Range Status   SARS Coronavirus 2 by RT PCR NEGATIVE NEGATIVE Final    Comment: (NOTE) SARS-CoV-2 target nucleic acids are NOT DETECTED.  The SARS-CoV-2 RNA is generally detectable in upper respiratory specimens during the acute phase of infection. The lowest concentration of SARS-CoV-2 viral copies this assay can detect is 138 copies/mL. A negative result does not preclude SARS-Cov-2 infection and should not be used as the sole basis for treatment or other patient management decisions. A negative result may occur with  improper specimen collection/handling, submission of specimen other than nasopharyngeal swab, presence of viral mutation(s) within the areas targeted by this assay, and inadequate number of viral copies(<138 copies/mL). A negative result must be combined with clinical observations, patient history, and epidemiological information. The expected result is Negative.  Fact Sheet for Patients:  BloggerCourse.com  Fact Sheet for Healthcare Providers:  SeriousBroker.it  This test is no  t yet approved or cleared by the [place] FDA and  has been authorized for detection and/or diagnosis of SARS-CoV-2 by FDA under an Emergency Use Authorization (EUA). This EUA will remain  in effect (meaning this test can be used) for the duration of the COVID-19 declaration under Section 564(b)(1) of the Act, 21 U.S.C.section 360bbb-3(b)(1), unless the authorization is terminated  or revoked sooner.       Influenza A by PCR NEGATIVE NEGATIVE Final   Influenza B by PCR NEGATIVE NEGATIVE Final    Comment: (NOTE) The Xpert Xpress SARS-CoV-2/FLU/RSV plus assay is intended as an aid in the diagnosis of influenza from Nasopharyngeal swab specimens and should not be used as a sole basis for treatment. Nasal washings and aspirates are unacceptable for Xpert Xpress SARS-CoV-2/FLU/RSV testing.  Fact Sheet for Patients: BloggerCourse.com  Fact Sheet for Healthcare Providers: SeriousBroker.it  This test is not yet approved or cleared by the [place] FDA and has been authorized for detection and/or diagnosis of SARS-CoV-2 by FDA under an Emergency Use Authorization (EUA). This EUA will remain in effect (meaning this test can be used) for the duration of the COVID-19 declaration under Section 564(b)(1) of the Act, 21 U.S.C. section 360bbb-3(b)(1), unless the authorization is terminated or revoked.     Resp Syncytial Virus by PCR NEGATIVE NEGATIVE Final    Comment: (NOTE) Fact Sheet for Patients: BloggerCourse.com  Fact Sheet for Healthcare Providers: SeriousBroker.it  This test is not yet approved or cleared by the [place] FDA and has been authorized for detection and/or diagnosis of SARS-CoV-2 by FDA under an Emergency Use Authorization (EUA). This EUA will remain in effect (meaning this test can be used) for the duration of the COVID-19 declaration under Section  564(b)(1) of the Act, 21 U.S.C. section 360bbb-3(b)(1), unless the authorization is terminated or revoked.  Performed at [facility], [address] [place], [place] [address]   Blood culture (routine x 2)     Status: Abnormal  Collection Time: 11/10/22  8:09 PM   Specimen: BLOOD  Result Value Ref Range Status   Specimen Description   Final    BLOOD BLOOD RIGHT ARM Performed at [facility], [address] [place], [place] [address]    Special Requests   Final    BOTTLES DRAWN AEROBIC AND ANAEROBIC Blood Culture adequate volume Performed at [facility], [address] [place], [place] [address]    Culture  Setup Time   Final    GRAM NEGATIVE RODS ANAEROBIC BOTTLE ONLY CRITICAL RESULT CALLED TO, READ BACK BY AND VERIFIED WITH: PHARMD B.AGEE AT 1307 ON 11/11/2022 BY T.SAAD. Performed at [facility], [address] [place], [place] [address]    Culture ESCHERICHIA COLI (A)  Final   Report Status 11/13/2022 FINAL  Final   Organism ID, Bacteria ESCHERICHIA COLI  Final      Susceptibility   Escherichia coli - MIC*    AMPICILLIN >=32 RESISTANT Resistant     CEFEPIME <=0.12 SENSITIVE Sensitive     CEFTAZIDIME <=1 SENSITIVE Sensitive     CEFTRIAXONE <=0.25 SENSITIVE Sensitive     CIPROFLOXACIN <=0.25 SENSITIVE Sensitive     GENTAMICIN <=1 SENSITIVE Sensitive     IMIPENEM <=0.25 SENSITIVE Sensitive     TRIMETH/SULFA <=20 SENSITIVE Sensitive     AMPICILLIN/SULBACTAM 16 INTERMEDIATE Intermediate     PIP/TAZO <=4 SENSITIVE Sensitive     * ESCHERICHIA COLI  Blood Culture ID Panel (Reflexed)     Status: Abnormal   Collection Time: 11/10/22  8:09 PM  Result Value Ref Range Status   Enterococcus faecalis NOT DETECTED NOT DETECTED Final   Enterococcus Faecium NOT DETECTED NOT DETECTED Final   Listeria monocytogenes NOT DETECTED NOT DETECTED Final   Staphylococcus species NOT DETECTED NOT DETECTED Final   Staphylococcus aureus (BCID) NOT  DETECTED NOT DETECTED Final   Staphylococcus epidermidis NOT DETECTED NOT DETECTED Final   Staphylococcus lugdunensis NOT DETECTED NOT DETECTED Final   Streptococcus species NOT DETECTED NOT DETECTED Final   Streptococcus agalactiae NOT DETECTED NOT DETECTED Final   Streptococcus pneumoniae NOT DETECTED NOT DETECTED Final   Streptococcus pyogenes NOT DETECTED NOT DETECTED Final   A.calcoaceticus-baumannii NOT DETECTED NOT DETECTED Final   Bacteroides fragilis NOT DETECTED NOT DETECTED Final   Enterobacterales DETECTED (A) NOT DETECTED Final    Comment: Enterobacterales represent a large order of gram negative bacteria, not a single organism. CRITICAL RESULT CALLED TO, READ BACK BY AND VERIFIED WITH: PHARMD B.AGEE AT 1307 ON 11/11/2022 BY T.SAAD.    Enterobacter cloacae complex NOT DETECTED NOT DETECTED Final   Escherichia coli DETECTED (A) NOT DETECTED Final    Comment: CRITICAL RESULT CALLED TO, READ BACK BY AND VERIFIED WITH: PHARMD B.AGEE AT 1307 ON 11/11/2022 BY T.SAAD.    Klebsiella aerogenes NOT DETECTED NOT DETECTED Final   Klebsiella oxytoca NOT DETECTED NOT DETECTED Final   Klebsiella pneumoniae NOT DETECTED NOT DETECTED Final   Proteus species NOT DETECTED NOT DETECTED Final   Salmonella species NOT DETECTED NOT DETECTED Final   Serratia marcescens NOT DETECTED NOT DETECTED Final   Haemophilus influenzae NOT DETECTED NOT DETECTED Final   Neisseria meningitidis NOT DETECTED NOT DETECTED Final   Pseudomonas aeruginosa NOT DETECTED NOT DETECTED Final   Stenotrophomonas maltophilia NOT DETECTED NOT DETECTED Final   Candida albicans NOT DETECTED NOT DETECTED Final   Candida auris NOT DETECTED NOT DETECTED Final   Candida glabrata NOT DETECTED NOT DETECTED Final  Candida krusei NOT DETECTED NOT DETECTED Final   Candida parapsilosis NOT DETECTED NOT DETECTED Final   Candida tropicalis NOT DETECTED NOT DETECTED Final   Cryptococcus neoformans/gattii NOT DETECTED NOT DETECTED  Final   CTX-M ESBL NOT DETECTED NOT DETECTED Final   Carbapenem resistance IMP NOT DETECTED NOT DETECTED Final   Carbapenem resistance KPC NOT DETECTED NOT DETECTED Final   Carbapenem resistance NDM NOT DETECTED NOT DETECTED Final   Carbapenem resist OXA 48 LIKE NOT DETECTED NOT DETECTED Final   Carbapenem resistance VIM NOT DETECTED NOT DETECTED Final    Comment: Performed at [facility], [address], [place], [place] [address]     Time coordinating discharge: 35 minutes  SIGNED:   Glade Lloyd, MD  [facility] 11/14/2022, 10:20 AM

## 2022-11-14 NOTE — TOC Transition Note (Signed)
Transition of Care (TOC) - CM/SW Discharge Note Donn Pierini RN, BSN Transitions of Care Unit 4E- RN Case Manager See Treatment Team for direct phone #  Patient Details  Name: Faith Massey MRN: [id number] Date of Birth: July 08, 1945  Transition of Care [facility]) CM/SW Contact:  Darrold Span, RN Phone Number: 11/14/2022, 11:33 AM   Clinical Narrative:   pt stable for transition home today   11/14/22 1131  TOC Brief Assessment  Insurance and Status Reviewed  Patient has primary care physician Yes  Home environment has been reviewed home  Prior level of function: self  Prior/Current Home Services No current home services  Social Determinants of Health Reivew SDOH reviewed no interventions necessary  Readmission risk has been reviewed Yes  Transition of care needs no transition of care needs at this time     Final next level of care: Home/Self Care Barriers to Discharge: No Barriers Identified   Patient Goals and CMS Choice      Discharge Placement                 home        Discharge Plan and Services Additional resources added to the After Visit Summary for                                       Social Determinants of Health (SDOH) Interventions SDOH Screenings   Food Insecurity: Food Insecurity Present (11/11/2022)  Housing: Medium Risk (11/11/2022)  Transportation Needs: No Transportation Needs (11/11/2022)  Utilities: Not At Risk (11/11/2022)  Alcohol Screen: Low Risk  (06/21/2022)  Depression (PHQ2-9): Low Risk  (09/14/2022)  Financial Resource Strain: Medium Risk (06/21/2022)  Physical Activity: Insufficiently Active (06/21/2022)  Social Connections: Moderately Integrated (06/21/2022)  Stress: Stress Concern Present (06/21/2022)  Tobacco Use: Low Risk  (11/11/2022)     Readmission Risk Interventions    11/14/2022   11:33 AM 04/27/2022    9:12 AM  Readmission Risk Prevention Plan  Post Dischage Appt  Patient refused  Medication Screening  Complete   Transportation Screening Complete Complete  Home Care Screening Complete   Medication Review (RN CM) Complete

## 2022-11-15 ENCOUNTER — Telehealth: Payer: Self-pay

## 2022-11-15 ENCOUNTER — Telehealth: Payer: Self-pay | Admitting: Family Medicine

## 2022-11-15 ENCOUNTER — Ambulatory Visit: Payer: Medicare HMO | Admitting: Orthopaedic Surgery

## 2022-11-15 NOTE — Telephone Encounter (Signed)
Pt would like to speak with Dr. Patsy Lager to discuss her recent prescription for cephALEXin (KEFLEX) 500 MG capsule. Please call & advise pt.

## 2022-11-15 NOTE — Transitions of Care (Post Inpatient/ED Visit) (Signed)
11/15/2022  Name: Faith Massey MRN: [id number] DOB: 1945/10/25  Today's TOC FU Call Status: Today's TOC FU Call Status:: Successful TOC FU Call Completed Unsuccessful Call (1st Attempt) Date: 11/15/22 [facility] FU Call Complete Date: 11/15/22 Patient's Name and Date of Birth confirmed.  Transition Care Management Follow-up Telephone Call Date of Discharge: 11/14/22 Discharge Facility: [facility]) Type of Discharge: Inpatient Admission Primary Inpatient Discharge Diagnosis:: fever How have you been since you were released from the hospital?: Better Any questions or concerns?: No  Items Reviewed: Did you receive and understand the discharge instructions provided?: Yes Medications obtained,verified, and reconciled?: Yes (Medications Reviewed) Any new allergies since your discharge?: No Dietary orders reviewed?: Yes Do you have support at home?: No  Medications Reviewed Today: Medications Reviewed Today     Reviewed by Karena Addison, LPN (Licensed Practical Nurse) on 11/15/22 at 1003  Med List Status: <None>   Medication Order Taking? Sig Documenting Provider Last Dose Status Informant  apixaban (ELIQUIS) 5 MG TABS tablet [id number] No Take 1 tablet (5 mg total) by mouth 2 (two) times daily. Copland, Gwenlyn Found, MD 11/10/2022 Active Self, Pharmacy Records  aspirin EC 81 MG tablet [id number] No Take 1 tablet (81 mg total) by mouth daily. Swallow whole. Osvaldo Shipper, MD 11/10/2022 Active Self, Pharmacy Records  cephALEXin [facility]) 500 MG capsule [id number]  Take 1 capsule (500 mg total) by mouth 3 (three) times daily for 9 days. Glade Lloyd, MD  Active   Cholecalciferol (VITAMIN D-3) 5000 UNITS TABS [id number] No Take 5,000 Units by mouth daily. [provider] 11/10/2022 Active Self, Pharmacy Records  COLLAGEN PO [id number] No Take 1 Scoop by mouth daily. [provider] 11/10/2022 Active Self, Pharmacy Records  ezetimibe (ZETIA) 10 MG tablet [id number] No Take 1 tablet (10  mg total) by mouth daily. Osvaldo Shipper, MD 11/10/2022 Active Self, Pharmacy Records  lisinopril (ZESTRIL) 40 MG tablet [id number] No Take 1 tablet (40 mg total) by mouth daily.  Patient not taking: Reported on 11/11/2022   Copland, Gwenlyn Found, MD Not Taking Active Self, Pharmacy Records  metronidazole (NORITATE) 1 % cream [id number] No Apply topically daily. Use as needed for skin breakout Copland, Gwenlyn Found, MD 11/10/2022 Active Self, Pharmacy Records  Multiple Vitamins-Minerals (MULTIVITAMIN WITH MINERALS) tablet [id number] No Take 1 tablet by mouth daily. [provider] 11/10/2022 Active Self, Pharmacy Records  nystatin (MYCOSTATIN) 100000 UNIT/ML suspension [id number]  Take 5 mLs (500,000 Units total) by mouth as needed (thrush). Glade Lloyd, MD  Active   pyridostigmine (MESTINON) 60 MG tablet [id number] No Take 1 tablet (60 mg total) by mouth 3 (three) times daily. Levert Feinstein, MD 11/10/2022 Active Self, Pharmacy Records            Home Care and Equipment/Supplies: Were Home Health Services Ordered?: NA Any new equipment or medical supplies ordered?: NA  Functional Questionnaire: Do you need assistance with bathing/showering or dressing?: No Do you need assistance with meal preparation?: No Do you need assistance with eating?: No Do you have difficulty maintaining continence: No Do you need assistance with getting out of bed/getting out of a chair/moving?: No Do you have difficulty managing or taking your medications?: No  Follow up appointments reviewed: PCP Follow-up appointment confirmed?: Yes Date of PCP follow-up appointment?: 11/17/22 Follow-up Provider: [facility] Follow-up appointment confirmed?: Yes Date of Specialist follow-up appointment?: 11/28/22 Follow-Up Specialty Provider:: GI Do you need transportation to your follow-up appointment?: No Do you understand care options if your condition(s) worsen?: Yes-patient verbalized  understanding    SIGNATURE Karena Addison, LPN [facility] Nurse Health Advisor Direct Dial [phone]

## 2022-11-15 NOTE — Transitions of Care (Post Inpatient/ED Visit) (Signed)
   11/15/2022  Name: Faith Massey MRN: [id number] DOB: 01-13-1946  Today's TOC FU Call Status: Today's TOC FU Call Status:: Unsuccessful Call (1st Attempt) Unsuccessful Call (1st Attempt) Date: 11/15/22  Attempted to reach the patient regarding the most recent Inpatient/ED visit.  Follow Up Plan: Additional outreach attempts will be made to reach the patient to complete the Transitions of Care (Post Inpatient/ED visit) call.   Signature Karena Addison, LPN [facility] Nurse Health Advisor Direct Dial [phone]

## 2022-11-16 ENCOUNTER — Other Ambulatory Visit: Payer: Self-pay | Admitting: *Deleted

## 2022-11-16 ENCOUNTER — Encounter: Payer: Medicare HMO | Admitting: Internal Medicine

## 2022-11-16 ENCOUNTER — Encounter: Payer: Self-pay | Admitting: Internal Medicine

## 2022-11-16 ENCOUNTER — Telehealth: Payer: Self-pay | Admitting: *Deleted

## 2022-11-16 DIAGNOSIS — N321 Vesicointestinal fistula: Secondary | ICD-10-CM

## 2022-11-16 MED ORDER — NA SULFATE-K SULFATE-MG SULF 17.5-3.13-1.6 GM/177ML PO SOLN: 1.0000 | Freq: Once | ORAL | 0 refills | Status: AC

## 2022-11-16 MED ORDER — NYSTATIN 100000 UNIT/ML MT SUSP: 5.0000 mL | OROMUCOSAL | 2 refills | Status: DC | PRN

## 2022-11-16 NOTE — Addendum Note (Signed)
Addended by: Pearline Cables on: 11/16/2022 06:40 PM   Modules accepted: Orders

## 2022-11-16 NOTE — Progress Notes (Unsigned)
[facility] [address] [place], [place] [address] [phone]  Date:  11/17/2022   Name:  Faith Massey   DOB:  24-Mar-1945   MRN:  [id number]  PCP:  Pearline Cables, MD    Chief Complaint: No chief complaint on file.   History of Present Illness:  Faith Massey is a 77 y.o. very pleasant female patient who presents with the following:  Patient seen today for follow-up from recent hospital admission History of hypertension, CVA, thymoma, myasthenia gravis, remote breast cancer 1995, hyperlipidemia, osteoporosis, pre-diabetes, pulmonary embolism March 2024 Earlier this summer she had concern of air coming from her bladder.  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 On presentation, she had leukocytosis, UA suggestive of UTI and CT of abdomen and pelvis revealed right perinephric stranding consistent with pyelonephritis. She was started on IV fluids and antibiotics.  During the hospitalization, her condition has improved.  She had E. coli bacteremia: Sensitivities are now available.  She is currently on IV Rocephin.  She feels much better and feels okay to go home today.  She will be discharged home today on oral Keflex for 9 more days to finish total 2-week course of therapy.   Discharge Diagnoses:  Acute pyelonephritis: Present on admission E. coli bacteremia -Imaging consistent with possible acute right-sided pyelonephritis.   Has E. coli bacteremia: Sensitivities are now available..  Currently on Rocephin -No temperature spikes over the last 48 hours. -She feels much better and feels okay to go home today.  She will be discharged home today on oral Keflex for 9 more days to finish total 2-week course of therapy. Leukocytosis -Improved Hyponatremia -Improved Essential hypertension -Blood pressure currently stable.  Continue lisinopril History of recent diagnosis of PE -Continue apixaban History of myasthenia gravis -Continue pyridostigmine  History of recent diagnosis of colovesical fistula -Outpatient follow-up with general surgery and GI.  Colonoscopy next week has been rescheduled by Dr. Derek Mound office. History of thymoma resection on 06/12/2022 -Outpatient follow-up with oncology/cardiothoracic surgery   She is continue to take cephalexin for the time being.  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 Antony Blackbird   Hyperlipidemia    Hypertension    Myasthenia  gravis (HCC)    Dx'd 03/2022   Osteoporosis 10/12/2012    Past Surgical History:  Procedure Laterality Date   ABDOMINAL HYSTERECTOMY  03/22/2003   BACK SURGERY  03/21/1998   BREAST SURGERY     MASTECTOMY Bilateral 03/21/1993     Social History   Tobacco Use   Smoking status: Never   Smokeless tobacco: Never  Vaping Use   Vaping status: Never Used  Substance Use Topics   Alcohol use: Not Currently    Alcohol/week: 1.0 standard drink of alcohol    Types: 1 Glasses of wine per week    Comment: occasional   Drug use: No    Family History  Problem Relation Age of Onset   Stroke Mother    Hypertension Mother    Myasthenia gravis Mother    Hypertension Father    Colon cancer Neg Hx    Esophageal cancer Neg Hx     Allergies  Allergen Reactions   Prednisone Shortness Of Breath, Swelling, Palpitations, Dermatitis and Hypertension    Patient reports having an intolerance to medication. Reports medication caused swelling and metallic taste in mouth.    Medication list has been reviewed and updated.  Current Outpatient Medications on File Prior to Visit  Medication Sig Dispense Refill   apixaban (ELIQUIS) 5 MG TABS tablet Take 1 tablet (5 mg total) by mouth 2 (two) times daily. 180 tablet 1   aspirin EC 81 MG tablet Take 1 tablet (81 mg total) by mouth daily. Swallow whole. 30 tablet 12   cephALEXin (KEFLEX) 500 MG capsule Take 1 capsule (500 mg total) by mouth 3 (three) times daily for 9 days. 27 capsule 0   Cholecalciferol (VITAMIN D-3) 5000 UNITS TABS Take 5,000 Units by mouth daily.     COLLAGEN PO Take 1 Scoop by mouth daily.     ezetimibe (ZETIA) 10 MG tablet Take 1 tablet (10 mg total) by mouth daily. 30 tablet 3   lisinopril (ZESTRIL) 40 MG tablet Take 1 tablet (40 mg total) by mouth daily. (Patient not taking: Reported on 11/11/2022) 90 tablet 3   metronidazole (NORITATE) 1 % cream Apply topically daily. Use as needed for skin breakout 60 g 0   Multiple Vitamins-Minerals (MULTIVITAMIN WITH MINERALS) tablet Take 1 tablet by mouth daily.     Na Sulfate-K Sulfate-Mg Sulf 17.5-3.13-1.6 GM/177ML SOLN Take 1 kit by mouth once for 1 dose. 354 mL 0   nystatin (MYCOSTATIN) 100000 UNIT/ML suspension Take  5 mLs (500,000 Units total) by mouth as needed (thrush). Swish, hold in mouth and spit up to 4x a day as needed 120 mL 2   pyridostigmine (MESTINON) 60 MG tablet Take 1 tablet (60 mg total) by mouth 3 (three) times daily. 90 tablet 6   No current facility-administered medications on file prior to visit.    Review of Systems:  ***  Physical Examination: There were no vitals filed for this visit. There were no vitals filed for this visit. There is no height or weight on file to calculate BMI. Ideal Body Weight:    ***  Assessment and Plan: ***  Signed Abbe Amsterdam, MD

## 2022-11-16 NOTE — Telephone Encounter (Signed)
Orders for the Colonoscopy scheduled for 11/23/22 via Dr. Leonides Schanz. Patient wanted to have a earlier scheduled procedure. Called the patient this am and have not been able to contact at this time. Left message on VM for patient explaining the new and earlier scheduled appt for the colonoscopy procedure.Suprep ordered via the pharmacy as well as the Ambulatory Referral, with Suprep instructions sent to patient's MyChart. Patient is presently taking Eliquis, noted on medication list. Sent note to patient's PCP notifying of hold needed for the Eliquis, 2 days prior to the procedure starting on 11/20/22. Patient also notified.

## 2022-11-16 NOTE — Telephone Encounter (Signed)
   Faith Massey [birth year] [id number]  Dear Dr. Patsy Lager:  We have scheduled the above named patient for a(n) colonoscopy procedure. Our records show that (s)he is on anticoagulation therapy.  Please advise as to whether the patient may come off their therapy of Eliquis 2 days prior to their procedure which is scheduled for 11/23/22.  Please route your response to Margit Hanks or fax response to [phone].  Sincerely,    [facility]

## 2022-11-16 NOTE — Telephone Encounter (Signed)
Will discuss during hospital follow-up tomorrow.

## 2022-11-17 ENCOUNTER — Encounter: Payer: Medicare HMO | Admitting: Physical Therapy

## 2022-11-17 ENCOUNTER — Ambulatory Visit (INDEPENDENT_AMBULATORY_CARE_PROVIDER_SITE_OTHER): Payer: Medicare HMO | Admitting: Family Medicine

## 2022-11-17 ENCOUNTER — Telehealth: Payer: Self-pay | Admitting: *Deleted

## 2022-11-17 VITALS — BP 122/70 | HR 101 | Temp 97.8°F | Resp 18 | Ht <= 58 in | Wt 114.0 lb

## 2022-11-17 DIAGNOSIS — B370 Candidal stomatitis: Secondary | ICD-10-CM

## 2022-11-17 DIAGNOSIS — N12 Tubulo-interstitial nephritis, not specified as acute or chronic: Secondary | ICD-10-CM

## 2022-11-17 DIAGNOSIS — N321 Vesicointestinal fistula: Secondary | ICD-10-CM

## 2022-11-17 HISTORY — DX: Vesicointestinal fistula: N32.1

## 2022-11-17 MED ORDER — CLOTRIMAZOLE 10 MG MT TROC
10.0000 mg | Freq: Every day | OROMUCOSAL | 1 refills | Status: DC
Start: 2022-11-17 — End: 2023-07-21

## 2022-11-17 NOTE — Telephone Encounter (Signed)
Dr. Patsy Lager had given approval for a previous hold on the Eliquis medication for a prior colonoscopy that was cancelled. The approval was dated on 10/26/22 for a 2 day hold of the Eliquis.

## 2022-11-18 LAB — URINE CULTURE
MICRO NUMBER:: 15399878
Result:: NO GROWTH
SPECIMEN QUALITY:: ADEQUATE

## 2022-11-19 ENCOUNTER — Encounter: Payer: Self-pay | Admitting: Family Medicine

## 2022-11-22 ENCOUNTER — Encounter: Payer: Medicare HMO | Admitting: Physical Therapy

## 2022-11-23 ENCOUNTER — Ambulatory Visit (AMBULATORY_SURGERY_CENTER): Payer: Medicare HMO | Admitting: Internal Medicine

## 2022-11-23 ENCOUNTER — Encounter: Payer: Self-pay | Admitting: Internal Medicine

## 2022-11-23 VITALS — BP 173/99 | HR 80 | Temp 97.5°F | Resp 15 | Ht <= 58 in | Wt 115.0 lb

## 2022-11-23 DIAGNOSIS — N321 Vesicointestinal fistula: Secondary | ICD-10-CM | POA: Diagnosis not present

## 2022-11-23 DIAGNOSIS — K635 Polyp of colon: Secondary | ICD-10-CM | POA: Diagnosis not present

## 2022-11-23 DIAGNOSIS — K5730 Diverticulosis of large intestine without perforation or abscess without bleeding: Secondary | ICD-10-CM | POA: Diagnosis not present

## 2022-11-23 DIAGNOSIS — I10 Essential (primary) hypertension: Secondary | ICD-10-CM | POA: Diagnosis not present

## 2022-11-23 DIAGNOSIS — D123 Benign neoplasm of transverse colon: Secondary | ICD-10-CM

## 2022-11-23 DIAGNOSIS — E785 Hyperlipidemia, unspecified: Secondary | ICD-10-CM | POA: Diagnosis not present

## 2022-11-23 MED ORDER — SODIUM CHLORIDE 0.9 % IV SOLN: 500.0000 mL | INTRAVENOUS | Status: DC

## 2022-11-23 NOTE — Progress Notes (Signed)
Pt. Lingered in PACU until Dr. Leonides Schanz came through, for some consultation re: possible fistula repair.  Pt.'s R. Upper arm in pain, as it was pre-procedure, due to torn bicep.  Told pt. Tylenol at home would be fine.

## 2022-11-23 NOTE — Progress Notes (Signed)
Called to room to assist during endoscopic procedure.  Patient ID and intended procedure confirmed with present staff. Received instructions for my participation in the procedure from the performing physician.  

## 2022-11-23 NOTE — Patient Instructions (Addendum)
Impression/Recommendations:  Polyp, diverticulosis, and hemorrhoid handouts given to patient.  Await pathology results.  Restart Eliquis tomorrow.  Recommend following up with Dr. Maisie Fus to discuss consideration for partial colectomy and fistula repair.  Will message Dr. Meridee Score to see if you would be a candidate for endoscopic treatment of a suspected colovesicular fistula.  YOU HAD AN ENDOSCOPIC PROCEDURE TODAY AT THE [facility]:   Refer to the procedure report that was given to you for any specific questions about what was found during the examination.  If the procedure report does not answer your questions, please call your gastroenterologist to clarify.  If you requested that your care partner not be given the details of your procedure findings, then the procedure report has been included in a sealed envelope for you to review at your convenience later.  YOU SHOULD EXPECT: Some feelings of bloating in the abdomen. Passage of more gas than usual.  Walking can help get rid of the air that was put into your GI tract during the procedure and reduce the bloating. If you had a lower endoscopy (such as a colonoscopy or flexible sigmoidoscopy) you may notice spotting of blood in your stool or on the toilet paper. If you underwent a bowel prep for your procedure, you may not have a normal bowel movement for a few days.  Please Note:  You might notice some irritation and congestion in your nose or some drainage.  This is from the oxygen used during your procedure.  There is no need for concern and it should clear up in a day or so.  SYMPTOMS TO REPORT IMMEDIATELY:  Following lower endoscopy (colonoscopy or flexible sigmoidoscopy):  Excessive amounts of blood in the stool  Significant tenderness or worsening of abdominal pains  Swelling of the abdomen that is new, acute  Fever of 100F or higher  For urgent or emergent issues, a gastroenterologist can be reached at any hour by  calling [phone]. Do not use MyChart messaging for urgent concerns.    DIET:  We do recommend a small meal at first, but then you may proceed to your regular diet.  Drink plenty of fluids but you should avoid alcoholic beverages for 24 hours.  ACTIVITY:  You should plan to take it easy for the rest of today and you should NOT DRIVE or use heavy machinery until tomorrow (because of the sedation medicines used during the test).    FOLLOW UP: Our staff will call the number listed on your records the next business day following your procedure.  We will call around 7:15- 8:00 am to check on you and address any questions or concerns that you may have regarding the information given to you following your procedure. If we do not reach you, we will leave a message.     If any biopsies were taken you will be contacted by phone or by letter within the next 1-3 weeks.  Please call us at [phone] if you have not heard about the biopsies in 3 weeks.    SIGNATURES/CONFIDENTIALITY: You and/or your care partner have signed paperwork which will be entered into your electronic medical record.  These signatures attest to the fact that that the information above on your After Visit Summary has been reviewed and is understood.  Full responsibility of the confidentiality of this discharge information lies with you and/or your care-partner.

## 2022-11-23 NOTE — Op Note (Signed)
[facility] Patient Name: Faith Massey Procedure Date: 11/23/2022 10:21 AM MRN: [id number] Endoscopist: Madelyn Brunner [place] , , [id number] Age: 77 Referring MD:  Date of Birth: [birth year] Gender: Female Account #: [id number] Procedure:                Colonoscopy Indications:              Abnormal CT of the GI tract Medicines:                Monitored Anesthesia Care Procedure:                Pre-Anesthesia Assessment:                           - Prior to the procedure, a History and Physical                            was performed, and patient medications and                            allergies were reviewed. The patient's tolerance of                            previous anesthesia was also reviewed. The risks                            and benefits of the procedure and the sedation                            options and risks were discussed with the patient.                            All questions were answered, and informed consent                            was obtained. Prior Anticoagulants: The patient has                            taken Eliquis (apixaban), last dose was 3 days                            prior to procedure. ASA Grade Assessment: II - A                            patient with mild systemic disease. After reviewing                            the risks and benefits, the patient was deemed in                            satisfactory condition to undergo the procedure.                           After obtaining informed consent, the colonoscope  was passed under direct vision. Throughout the                            procedure, the patient's blood pressure, pulse, and                            oxygen saturations were monitored continuously. The                            CF [id number] #[id number] was introduced through the anus                            and advanced to the the terminal ileum. The                            colonoscopy  was performed without difficulty. The                            patient tolerated the procedure well. The quality                            of the bowel preparation was good. The terminal                            ileum, ileocecal valve, appendiceal orifice, and                            rectum were photographed. Scope In: 10:26:19 AM Scope Out: 10:46:32 AM Scope Withdrawal Time: 0 hours 12 minutes 41 seconds  Total Procedure Duration: 0 hours 20 minutes 13 seconds  Findings:                 The terminal ileum appeared normal.                           Two sessile polyps were found in the transverse                            colon. The polyps were 3 to 6 mm in size. These                            polyps were removed with a cold snare. Resection                            and retrieval were complete.                           Multiple diverticula were found in the sigmoid                            colon.                           Non-bleeding internal hemorrhoids were found during  retroflexion. Complications:            No immediate complications. Estimated Blood Loss:     Estimated blood loss was minimal. Impression:               - The examined portion of the ileum was normal.                           - Two 3 to 6 mm polyps in the transverse colon,                            removed with a cold snare. Resected and retrieved.                           - Diverticulosis in the sigmoid colon.                           - Non-bleeding internal hemorrhoids. Recommendation:           - Discharge patient to home (with escort).                           - Await pathology results.                           - Okay to restart your Eliquis tomorrow.                           - Recommend following up with Dr. Maisie Fus to discuss                            consideration for partial colectomy and fistula                            repair.                           - I  will message Dr. 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                           - The findings and recommendations were discussed                            with the patient. Dr Particia Lather "Alan Ripper" Leonides Schanz,  11/23/2022 10:52:13 AM

## 2022-11-23 NOTE — Progress Notes (Signed)
Sedate, gd SR, tolerated procedure well, VSS, report to RN 

## 2022-11-23 NOTE — Progress Notes (Signed)
GASTROENTEROLOGY PROCEDURE H&P NOTE   Primary Care Physician: Pearline Cables, MD    Reason for Procedure:   Colovesicular fistula  Plan:    Colonoscopy  Patient is appropriate for endoscopic procedure(s) in the ambulatory ([facility]) setting.  The nature of the procedure, as well as the risks, benefits, and alternatives were carefully and thoroughly reviewed with the patient. Ample time for discussion and questions allowed. The patient understood, was satisfied, and agreed to proceed.     HPI: Faith Massey is a 77 y.o. 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  Patient was most recently seen in the Gastroenterology Clinic on 10/24/22.  No interval change in medical history since that appointment. Please refer to that note for full details regarding GI history and clinical presentation.   Past Medical History:  Diagnosis Date   Breast [facility])    History of radiation therapy    Chest 06/09/2022 - 07/21/2022 - Dr. Antony Blackbird   Hyperlipidemia    Hypertension    Myasthenia gravis (HCC)    Dx'd 03/2022   Osteoporosis 10/12/2012    Past Surgical History:  Procedure Laterality Date   ABDOMINAL HYSTERECTOMY  03/22/2003   BACK SURGERY  03/21/1998   BREAST SURGERY     MASTECTOMY Bilateral 03/21/1993    Prior to Admission medications   Medication Sig Start Date End Date Taking? Authorizing Provider  apixaban (ELIQUIS) 5 MG TABS tablet Take 1 tablet (5 mg total) by mouth 2 (two) times daily. 10/21/22 04/19/23  Copland, Gwenlyn Found, MD  aspirin EC 81 MG tablet Take 1 tablet (81 mg total) by mouth daily. Swallow whole. 03/30/22   Osvaldo Shipper, MD  cephALEXin (KEFLEX) 500 MG capsule Take 1 capsule (500 mg total) by mouth 3 (three) times daily for 9 days. 11/15/22 11/24/22  Glade Lloyd, MD  Cholecalciferol (VITAMIN D-3) 5000 UNITS TABS Take 5,000 Units by mouth daily.    [provider]  clotrimazole (MYCELEX) 10 MG troche Take 1 tablet (10 mg  total) by mouth 5 (five) times daily. 11/17/22   Copland, Gwenlyn Found, MD  COLLAGEN PO Take 1 Scoop by mouth daily.    [provider]  ezetimibe (ZETIA) 10 MG tablet Take 1 tablet (10 mg total) by mouth daily. 03/30/22   Osvaldo Shipper, MD  lisinopril (ZESTRIL) 40 MG tablet Take 1 tablet (40 mg total) by mouth daily. 06/14/22 03/31/23  Copland, Gwenlyn Found, MD  metronidazole (NORITATE) 1 % cream Apply topically daily. Use as needed for skin breakout 10/26/22   Copland, Gwenlyn Found, MD  Multiple Vitamins-Minerals (MULTIVITAMIN WITH MINERALS) tablet Take 1 tablet by mouth daily.    [provider]  nystatin (MYCOSTATIN) 100000 UNIT/ML suspension Take 5 mLs (500,000 Units total) by mouth as needed (thrush). Swish, hold in mouth and spit up to 4x a day as needed 11/16/22   Copland, Gwenlyn Found, MD  pyridostigmine (MESTINON) 60 MG tablet Take 1 tablet (60 mg total) by mouth 3 (three) times daily. 07/14/22   Levert Feinstein, MD    Current Outpatient Medications  Medication Sig Dispense Refill   apixaban (ELIQUIS) 5 MG TABS tablet Take 1 tablet (5 mg total) by mouth 2 (two) times daily. 180 tablet 1   aspirin EC 81 MG tablet Take 1 tablet (81 mg total) by mouth daily. Swallow whole. 30 tablet 12   cephALEXin (KEFLEX) 500 MG capsule Take 1 capsule (500 mg total) by mouth 3 (three) times daily for 9 days.  27 capsule 0   Cholecalciferol (VITAMIN D-3) 5000 UNITS TABS Take 5,000 Units by mouth daily.     clotrimazole (MYCELEX) 10 MG troche Take 1 tablet (10 mg total) by mouth 5 (five) times daily. 35 Troche 1   COLLAGEN PO Take 1 Scoop by mouth daily.     ezetimibe (ZETIA) 10 MG tablet Take 1 tablet (10 mg total) by mouth daily. 30 tablet 3   lisinopril (ZESTRIL) 40 MG tablet Take 1 tablet (40 mg total) by mouth daily. 90 tablet 3   metronidazole (NORITATE) 1 % cream Apply topically daily. Use as needed for skin breakout 60 g 0   Multiple Vitamins-Minerals (MULTIVITAMIN WITH MINERALS) tablet Take 1 tablet by  mouth daily.     nystatin (MYCOSTATIN) 100000 UNIT/ML suspension Take 5 mLs (500,000 Units total) by mouth as needed (thrush). Swish, hold in mouth and spit up to 4x a day as needed 120 mL 2   pyridostigmine (MESTINON) 60 MG tablet Take 1 tablet (60 mg total) by mouth 3 (three) times daily. 90 tablet 6   No current facility-administered medications for this visit.    Allergies as of 11/23/2022 - Review Complete 11/23/2022  Allergen Reaction Noted   Prednisone Shortness Of Breath, Swelling, Palpitations, Dermatitis, and Hypertension 06/21/2022    Family History  Problem Relation Age of Onset   Stroke Mother    Hypertension Mother    Myasthenia gravis Mother    Hypertension Father    Colon cancer Neg Hx    Esophageal cancer Neg Hx     Social History   Socioeconomic History   Marital status: Divorced    Spouse name: Not on file   Number of children: Not on file   Years of education: Not on file   Highest education level: Bachelor's degree (e.g., BA, AB, BS)  Occupational History   Not on file  Tobacco Use   Smoking status: Never   Smokeless tobacco: Never  Vaping Use   Vaping status: Never Used  Substance and Sexual Activity   Alcohol use: Not Currently    Alcohol/week: 1.0 standard drink of alcohol    Types: 1 Glasses of wine per week    Comment: occasional   Drug use: No   Sexual activity: Not Currently  Other Topics Concern   Not on file  Social History Narrative   Lives alone   Right handed   Caffeine- 2 cups of coffee   Social Determinants of Health   Financial Resource Strain: Medium Risk (06/21/2022)   Overall Financial Resource Strain (CARDIA)    Difficulty of Paying Living Expenses: Somewhat hard  Food Insecurity: Food Insecurity Present (11/11/2022)   Hunger Vital Sign    Worried About Running Out of Food in the Last Year: Sometimes true    Ran Out of Food in the Last Year: Never true  Transportation Needs: No Transportation Needs (11/11/2022)   PRAPARE  - Administrator, Civil Service (Medical): No    Lack of Transportation (Non-Medical): No  Physical Activity: Insufficiently Active (06/21/2022)   Exercise Vital Sign    Days of Exercise per Week: 5 days    Minutes of Exercise per Session: 20 min  Stress: Stress Concern Present (06/21/2022)   [employer] of Occupational Health - Occupational Stress Questionnaire    Feeling of Stress : To some extent  Social Connections: Moderately Integrated (06/21/2022)   Social Connection and Isolation Panel [NHANES]    Frequency of Communication with Friends and Family:  More than three times a week    Frequency of Social Gatherings with Friends and Family: More than three times a week    Attends Religious Services: More than 4 times per year    Active Member of [employer] or Organizations: Yes    Attends Banker Meetings: More than 4 times per year    Marital Status: Divorced  Intimate Partner Violence: Not At Risk (11/11/2022)   Humiliation, Afraid, Rape, and Kick questionnaire    Fear of Current or Ex-Partner: No    Emotionally Abused: No    Physically Abused: No    Sexually Abused: No    Physical Exam: Vital signs in last 24 hours: There were no vitals taken for this visit. GEN: NAD EYE: Sclerae anicteric ENT: MMM CV: Non-tachycardic Pulm: No increased WOB GI: Soft NEURO:  Alert & Oriented   Eulah Pont, MD [facility]   11/23/2022 9:36 AM

## 2022-11-24 ENCOUNTER — Ambulatory Visit: Payer: Medicare HMO | Attending: Physician Assistant

## 2022-11-24 ENCOUNTER — Telehealth: Payer: Self-pay | Admitting: *Deleted

## 2022-11-24 DIAGNOSIS — M6281 Muscle weakness (generalized): Secondary | ICD-10-CM

## 2022-11-24 DIAGNOSIS — M25511 Pain in right shoulder: Secondary | ICD-10-CM | POA: Diagnosis not present

## 2022-11-24 DIAGNOSIS — G7001 Myasthenia gravis with (acute) exacerbation: Secondary | ICD-10-CM | POA: Diagnosis not present

## 2022-11-24 DIAGNOSIS — G709 Myoneural disorder, unspecified: Secondary | ICD-10-CM | POA: Diagnosis not present

## 2022-11-24 DIAGNOSIS — S46211A Strain of muscle, fascia and tendon of other parts of biceps, right arm, initial encounter: Secondary | ICD-10-CM | POA: Diagnosis not present

## 2022-11-24 DIAGNOSIS — G8929 Other chronic pain: Secondary | ICD-10-CM | POA: Insufficient documentation

## 2022-11-24 DIAGNOSIS — M25611 Stiffness of right shoulder, not elsewhere classified: Secondary | ICD-10-CM

## 2022-11-24 DIAGNOSIS — R252 Cramp and spasm: Secondary | ICD-10-CM

## 2022-11-24 NOTE — Therapy (Signed)
OUTPATIENT PHYSICAL THERAPY TREATMENT    Patient Name: Faith Massey MRN: [id number] DOB:1945/12/05, 77 y.o., female Today's Date: 11/24/2022  END OF SESSION:  PT End of Session - 11/24/22 1401     Visit Number 6    Date for PT Re-Evaluation 12/05/22    Authorization Type [employer] MCR    Progress Note Due on Visit 10    PT Start Time 1316    PT Stop Time 1359    PT Time Calculation (min) 43 min    Activity Tolerance Patient tolerated treatment well    Behavior During Therapy WFL for tasks assessed/performed                  Past Medical History:  Diagnosis Date   Breast CA (HCC)    History of radiation therapy    Chest 06/09/2022 - 07/21/2022 - Dr. 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   Myasthenic syndrome (HCC) 04/12/2022   CVA (cerebral vascular accident) (HCC) 03/28/2022   HTN (hypertension) 03/28/2022   Chest mass 03/28/2022   Right knee injury 02/14/2017   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 10/16/2012   Osteoporosis 10/12/2012    PCP: Pearline Cables, MD  REFERRING PROVIDER: Cristie Hem, PA-C  REFERRING DIAG: [phone] (ICD-10-CM) - Chronic right shoulder pain  THERAPY DIAG:  Acute pain of right shoulder  Stiffness of right shoulder, not elsewhere classified  Muscle weakness (generalized)  Cramp and spasm  RATIONALE FOR EVALUATION AND TREATMENT: Rehabilitation  ONSET DATE:  06/20/22  NEXT MD VISIT: Last week of August   SUBJECTIVE:                                                                                                                                                                                      SUBJECTIVE STATEMENT: Pt went to hospital recently for 5 days was very sick. She also had a fall recently while waling her dog and fell on her R shoulder, been having constant pain ever since.  EVAL:  I had so much pain last night I had  to take a Tramadol. I can't get a blouse out of the closet. Hurts to reach out. Can't reach behind head and can't lift arm without support of other arm. A little better since injection last week. Just recently went off prednisone which she had for IVIG therapy. Diagnosed with Myasthenia gravis in January of this year (associated with the mass). 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 Tore biceps when she was changing the filter on her water filtering system, heard pop. She uses cane intermittently due to balance issues from Myasthenia gravis and prednisone.  Hand dominance: Right  PAIN:  Are you having pain? Yes: NPRS scale: 5-6/10 Pain location: anterior R shoulder  Pain description: constant dull ache Aggravating factors: reaching forward, trying to lift, cleaning up storm debris Relieving factors: Tramadol  PERTINENT HISTORY: MG, h/o radiation (March and May 2024) for mediastinal mass, HTN, OP  PRECAUTIONS: None  RED FLAGS: None   WEIGHT BEARING RESTRICTIONS: No  FALLS:  Has patient fallen in last 6 months? No  LIVING ENVIRONMENT: Lives with: lives alone Lives in: House/apartment Stairs: No Has following equipment at home: Single point cane  OCCUPATION: retired  PLOF: Independent with household mobility with device  PATIENT GOALS:full function of her R arm    OBJECTIVE:   DIAGNOSTIC FINDINGS:  [place]: Limited musculoskeletal ultrasound of the right upper extremity, right  shoulder was  performed.  Short axis evaluation of the bicipital groove  shows no cortical irregularity of the humeral head or groove of the  shoulder.  Within the bicipital groove there is a hypoechoic space with  absent visualization of the proximal head of the biceps tendon, indicative  of likely proximal bicep tendon tear.  Short and long axis evaluation of  the biceps tendon does show retraction into the proximal aspect of the  bicep musculature.  Evaluation of the [facility] joint shows at least moderate  arthritic changes without significant bursal distention.   PATIENT SURVEYS:  Quick Dash 68.2 / 100 = 68.2 %  COGNITION: Overall cognitive status: Within functional limits for tasks assessed     SENSATION: WFL  POSTURE: R shoulder anterior, depressed R shoulder, scapula and R ilia  UPPER EXTREMITY ROM:   A/PROM Right eval Left eval Right    Shoulder flexion 20/157 passively with clunk    Shoulder extension WNL    Shoulder abduction 58/177    Shoulder adduction     Shoulder internal rotation 55/62    Shoulder external rotation 65/85 very uncomfortable    Elbow flexion full    Elbow extension full    (Blank rows = not tested)  UPPER EXTREMITY MMT:  MMT Right eval Left eval Right   Shoulder flexion 2-    Shoulder extension 4+    Shoulder abduction 4-* with elbow bent    Shoulder adduction     Shoulder internal rotation 4-    Shoulder external rotation 5    Middle trapezius     Lower trapezius     Elbow flexion 4*    Elbow extension 5 mild pain     Wrist pronation     Wrist supination     Grip strength (lbs) 10 12   (Blank rows = not tested)   PALPATION:  Marked UT, pecs, ant shoulder, IS  Decreased stability in ant R shoulder joint   TODAY'S TREATMENT:  DATE:  11/24/22 THERAPEUTIC EXERCISE: to improve flexibility, strength and mobility.   Demonstration, verbal and tactile cues throughout for technique. Pulleys - R shoulder flexion x 6 min Seated shoulder rolls to warm up scapula x 20 Standing rows YTB 2 x 10 Standing shoulder ext YTB x 10 Shoulder ER B and ER isometric step out - p!  Attempted to assess ROM but p!  11/02/22 THERAPEUTIC EXERCISE: to improve flexibility, strength and mobility.  Demonstration, verbal and tactile cues throughout for technique. Pulleys - R shoulder flexion x 3 min, avoiding forcing painful ROM Seated YTB scap retraction + shoulder row 10 x 5" Standing YTB scap retraction + shoulder extension 10 x 5" Standing YTB scap retraction + shoulder ER 10 x 5 Prone  W, T, Y 2x 5 more tolerable than 10 reps, also shoulder ext x 10  10/28/22 THERAPEUTIC EXERCISE: to improve flexibility, strength and mobility.  Demonstration, verbal and tactile cues throughout for technique. Pulleys - R shoulder flexion x 3 min, avoiding forcing painful ROM Seated wand R shoulder flexion AAROM - attempted with wand held horizontal and vertical - better tolerance with vertical position but still with increased pain and fwd rounding/elevation of shoulder even with PT providing facilitation of scapular motion  Seated YTB scap retraction + shoulder row 10 x 5" Standing YTB scap retraction + shoulder row 10 x 5" Standing YTB scap retraction + shoulder extension 10 x 5" Standing YTB scap retraction + shoulder ER 10 x 5"   10/26/22 THERAPEUTIC EXERCISE: to improve flexibility, strength and mobility.  Demonstration, verbal and tactile cues throughout for technique. Pulleys - R shoulder flexion x 1 min, rest, another minute after resting Shoulder rolls 10x2" Table slides into flexion x12 bil Table slides into scaption x12 bil Prayer slides up the wall into flexion AAROM x 10 Isometric R shoulder flexion,ER,IR 3x10" each pushing into wall Reviewed HEP, consolidated, and adjusted the flow of exercises for best  results   10/13/22 THERAPEUTIC EXERCISE: to improve flexibility, strength and mobility.  Demonstration, verbal and tactile cues throughout for technique. Pulleys - R shoulder flexion x 2.5 min, discontinued due to increasing discomfort Seated B shoulder flexion towel slide at table top  - 1 x daily - 7 x weekly - 1 sets - 10 reps Standing R shoulder flexion wall slide with towel x 3 - deferred due to increased pain, although patient reports she was able to do this at home without increased pain Standing R shoulder flexion isometric at wall 5 x 10" Standing R shoulder IR isometric at door frame 5 x 10" Standing R shoulder ER isometric at door frame 5 x 10" Seated scapular retraction 10 x 5" Seated scapular + B shoulder ER 10 x 5" Supine scapular retraction and shoulder extension isometric into mat table 10 x 5" Seated supported elbow flexion x 10 Seated supported forearm pronation/supination 2# db x 10   PATIENT EDUCATION: Education details: HEP update - prone exercises Person educated: Patient Education method: Explanation, Demonstration, and Handouts Education comprehension: verbalized understanding and returned demonstration  HOME EXERCISE PROGRAM: Access Code: [email or web address] URL: [email or web address] Date: 11/24/2022 Prepared by: Verta Ellen  Exercises - Seated Bilateral Shoulder Flexion Towel Slide at Table Top  - 1 x daily - 7 x weekly - 1 sets - 10 reps - Seated Shoulder Towel Slides Scaption at Table  - 1 x daily - 7 x weekly - 1 sets - 10 reps - Seated Scapular Retraction  - 1 x daily - 7 x  weekly - 1-3 sets - 10 reps - 2-3 sec hold - Seated Scapular Retraction with External Rotation  - 1 x daily - 7 x weekly - 1 sets - 10 reps - Shoulder Flexion Wall Slide with Towel  - 1 x daily - 7 x weekly - 2 sets - 5 reps - Isometric Shoulder Flexion at Wall  - 1 x daily - 7 x weekly - 1 sets - 5 reps - 10 sec hold - Standing Isometric Shoulder Internal Rotation at Doorway   - 1 x daily - 7 x weekly - 1 sets - 5 reps - 10 sec hold - Standing Isometric Shoulder External Rotation with Doorway (Mirrored)  - 1 x daily - 7 x weekly - 1 sets - 5 reps - 10 sec  hold - Standing Bilateral Low Shoulder Row with Anchored Resistance  - 1 x daily - 7 x weekly - 2 sets - 10 reps - 5 sec hold - Scapular Retraction with Resistance Advanced  - 1 x daily - 7 x weekly - 2 sets - 10 reps - 5 sec hold   ASSESSMENT:  CLINICAL IMPRESSION: Patryce was recently hospitalized for illness as well as having a fall where she landed on her R shoulder while walking her dog. She presented with increased pain in R shoulder today, unable to raise it much, however this seems to be more weakness than anything. She needs more work on scapular stabilization as seen with evident shoulder shrug trying to raise her arm and rounded shoulder posture at rest. Emphasized the aforementioned deficits with exercises and consolidated HEP as well. Unable to assess ROM today d/t pain or strength. She also needs to reschedule f/u visit with MD.    OBJECTIVE IMPAIRMENTS: decreased ROM, decreased strength, increased muscle spasms, impaired flexibility, impaired UE functional use, postural dysfunction, and pain.   ACTIVITY LIMITATIONS: carrying, lifting, sleeping, bathing, toileting, dressing, self feeding, reach over head, and hygiene/grooming  PARTICIPATION LIMITATIONS: meal prep, cleaning, laundry, driving, and yard work  PERSONAL FACTORS: Time since onset of injury/illness/exacerbation and 3+ comorbidities: Myasthenia gravis, h/o radiation for mediastinal mass, HTN, OP  are also affecting patient's functional outcome.   REHAB POTENTIAL: Good  CLINICAL DECISION MAKING: Evolving/moderate complexity  EVALUATION COMPLEXITY: Moderate   GOALS: Goals reviewed with patient? Yes  SHORT TERM GOALS: Target date: 11/07/2022   Patient will be independent with initial HEP.  Baseline:  Goal status: MET - 10/26/22  2.   Patient able to sleep without waking from shoulder pain  Baseline:  Goal status: IN PROGRESS- 11/24/22 waking up every night since fall  LONG TERM GOALS: Target date: 12/05/2022   Patient will be independent with advanced/ongoing HEP to improve outcomes and carryover.  Baseline:  Goal status: IN PROGRESS  2.  Patient will report 75% improvement in R shoulder pain to improve QOL.  Baseline:  Goal status: IN PROGRESS  3. Improved R grip strength by 5-10#  Baseline:  Goal status: IN PROGRESS  4.  Patient to improve R shoulder AROM to [facility] without pain provocation to allow for increased ease of ADLs.  Baseline:  Goal status: IN PROGRESS  5.  Patient will demonstrate improved functional R UE strength as demonstrated her ability to use her arm to perform grooming and hygiene. Baseline:  Goal status: IN PROGRESS  6  Patient will report 78/100 on Quick Dash to demonstrate improved functional ability.  Baseline: 68.2 / 100 = 68.2 % Goal status: IN PROGRESS   PLAN:  PT FREQUENCY: 2x/week  PT DURATION: 8 weeks  PLANNED INTERVENTIONS: Therapeutic exercises, Therapeutic activity, Neuromuscular re-education, Patient/Family education, Self Care, Joint mobilization, Aquatic Therapy, Dry Needling, Electrical stimulation, Spinal mobilization, Cryotherapy, Moist heat, Taping, Ionotophoresis 4mg /ml Dexamethasone, and Manual therapy  PLAN FOR NEXT SESSION: shoulder/RC strength and shoulder ROM; Scapular stabilization!; Review and update HEP as needed   Darleene Cleaver, PTA  11/24/2022, 2:32 PM

## 2022-11-24 NOTE — Telephone Encounter (Signed)
  Follow up Call-     11/23/2022    9:52 AM  Call back number  Post procedure Call Back phone  # [phone]  Permission to leave phone message No     Patient questions:   Message left to call us if necessary.

## 2022-11-25 ENCOUNTER — Encounter: Payer: Self-pay | Admitting: Internal Medicine

## 2022-11-25 ENCOUNTER — Telehealth: Payer: Self-pay | Admitting: Internal Medicine

## 2022-11-25 ENCOUNTER — Encounter: Payer: Self-pay | Admitting: Family Medicine

## 2022-11-25 ENCOUNTER — Encounter: Payer: Self-pay | Admitting: Hematology & Oncology

## 2022-11-25 DIAGNOSIS — G7001 Myasthenia gravis with (acute) exacerbation: Secondary | ICD-10-CM | POA: Diagnosis not present

## 2022-11-25 DIAGNOSIS — G709 Myoneural disorder, unspecified: Secondary | ICD-10-CM | POA: Diagnosis not present

## 2022-11-25 NOTE — Telephone Encounter (Addendum)
Called and spoke to the patient about my discussion with Dr. Meridee Score.  Dr. Meridee Score did not think that endoscopic closure of her colovesicular fistula would be feasible because the fistula was not clearly visualized during her colonoscopy procedure.  He suggested that definitive treatment would be the partial colectomy that was offered by Dr. Maisie Fus previously.  I relayed this to the patient, and she is agreeable to talking to Dr. Maisie Fus again about a partial colectomy.  Patient will plan to make an appointment with Dr. Maisie Fus.  Will CC Dr. Maisie Fus to this telephone note.  Patient wanted me to make Dr. Maisie Fus aware that she has been experiencing recurrent UTIs including a recent episode of pyelonephritis that is suspected to be related to a colovesicular fistula.

## 2022-11-26 ENCOUNTER — Encounter: Payer: Self-pay | Admitting: Family Medicine

## 2022-11-27 ENCOUNTER — Encounter: Payer: Self-pay | Admitting: Family Medicine

## 2022-11-28 ENCOUNTER — Encounter: Payer: Medicare HMO | Admitting: Internal Medicine

## 2022-11-28 ENCOUNTER — Encounter: Payer: Self-pay | Admitting: Physical Therapy

## 2022-11-28 ENCOUNTER — Ambulatory Visit: Payer: Medicare HMO | Admitting: Physical Therapy

## 2022-11-28 DIAGNOSIS — R252 Cramp and spasm: Secondary | ICD-10-CM | POA: Diagnosis not present

## 2022-11-28 DIAGNOSIS — M6281 Muscle weakness (generalized): Secondary | ICD-10-CM

## 2022-11-28 DIAGNOSIS — G8929 Other chronic pain: Secondary | ICD-10-CM | POA: Diagnosis not present

## 2022-11-28 DIAGNOSIS — M25611 Stiffness of right shoulder, not elsewhere classified: Secondary | ICD-10-CM | POA: Diagnosis not present

## 2022-11-28 DIAGNOSIS — M25511 Pain in right shoulder: Secondary | ICD-10-CM | POA: Diagnosis not present

## 2022-11-28 DIAGNOSIS — S46211A Strain of muscle, fascia and tendon of other parts of biceps, right arm, initial encounter: Secondary | ICD-10-CM | POA: Diagnosis not present

## 2022-11-28 MED ORDER — CEPHALEXIN 500 MG PO CAPS: 500.0000 mg | ORAL_CAPSULE | Freq: Two times a day (BID) | ORAL | 0 refills | Status: DC

## 2022-11-28 NOTE — Telephone Encounter (Signed)
Called [facility] today, on hold for 22 minutes- they will call the patient and hopefully see her a week earlier  I will continue keflex in the meantime- reduce to 500 BID

## 2022-11-28 NOTE — Therapy (Signed)
OUTPATIENT PHYSICAL THERAPY TREATMENT / RECERTIFICATION  Progress Note  Reporting Period 10/10/2022 to 11/28/2022   See note below for Objective Data and Assessment of Progress/Goals.      Patient Name: Faith Massey MRN: [id number] DOB:[birth year], 77 y.o., female Today's Date: 11/28/2022  END OF SESSION:  PT End of Session - 11/28/22 1315     Visit Number 7    Date for PT Re-Evaluation 01/23/23    Authorization Type [employer] MCR    Progress Note Due on Visit 10    PT Start Time 1315    PT Stop Time 1405    PT Time Calculation (min) 50 min    Activity Tolerance Patient tolerated treatment well    Behavior During Therapy [facility] for tasks assessed/performed                  Past Medical History:  Diagnosis Date   Breast CA (HCC)    History of radiation therapy    Chest 06/09/2022 - 07/21/2022 - Dr. 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   Myasthenic syndrome (HCC) 04/12/2022   CVA (cerebral vascular accident) (HCC) 03/28/2022   HTN (hypertension) 03/28/2022   Chest mass 03/28/2022   Right knee injury 02/14/2017   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 10/16/2012   Osteoporosis 10/12/2012    PCP: Pearline Cables, MD  REFERRING PROVIDER: Cristie Hem, PA-C  REFERRING DIAG: [phone] (ICD-10-CM) - Chronic right shoulder pain  THERAPY DIAG:  Acute pain of right shoulder  Stiffness of right shoulder, not  elsewhere classified  Muscle weakness (generalized)  Cramp and spasm  RATIONALE FOR EVALUATION AND TREATMENT: Rehabilitation  ONSET DATE: 06/20/22  NEXT MD VISIT: pending reschedule   SUBJECTIVE:                                                                                                                                                                                      SUBJECTIVE STATEMENT: Pt reports she wrenched her shoulder while driving to PT today attempting to reach behind her for something in her purse -  pain up to 10-12-/10 for a few minutes. Pain has also been increased since her recent fall while walking her dog, with pain waking her up after 2-3 hrs while sleeping.  EVAL:  I had so much pain last night I had to take a Tramadol. I can't get a blouse out of the closet. Hurts to reach out. Can't reach behind head and can't lift arm without support of other arm. A little better since injection last week. Just recently went off prednisone which she had for IVIG therapy. Diagnosed with Myasthenia gravis in January of this year (associated with the mass). 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 Tore biceps when she was changing the filter on her water filtering system, heard pop. She uses cane intermittently due to balance issues from Myasthenia gravis and prednisone.  Hand dominance: Right  PAIN:  Are you having pain? Yes: NPRS scale: 5-6/10 Pain location: anterior R shoulder  Pain description: constant dull ache Aggravating factors: reaching forward, trying to lift Relieving factors: Tramadol  PERTINENT HISTORY: MG, h/o radiation (March and May 2024) for mediastinal mass, HTN, OP  PRECAUTIONS: None  RED FLAGS: None   WEIGHT BEARING RESTRICTIONS: No  FALLS:  Has patient fallen in last 6 months? No  LIVING ENVIRONMENT: Lives with: lives alone Lives in: House/apartment Stairs: No Has following equipment at home: Single point  cane  OCCUPATION: retired  PLOF: Independent with household mobility with device  PATIENT GOALS:full function of her R arm   OBJECTIVE:   DIAGNOSTIC FINDINGS:  [place]: Limited musculoskeletal ultrasound of the right upper extremity, right  shoulder was performed.  Short axis evaluation of the bicipital groove  shows no cortical irregularity of the humeral head or groove of the  shoulder.  Within the bicipital groove there is a hypoechoic space with  absent visualization of the proximal head of the biceps tendon, indicative  of likely proximal bicep tendon tear.  Short and long axis evaluation of  the biceps tendon does show retraction into the proximal aspect of the  bicep musculature.  Evaluation of the [facility] joint shows at least moderate  arthritic changes without significant bursal distention.   PATIENT SURVEYS:  Quick Dash 68.2 / 100 = 68.2 % ; 11/28/22 - 63.6 / 100 = 63.6 %  COGNITION: Overall cognitive status: Within functional limits for tasks assessed     SENSATION: WFL  POSTURE: R shoulder anterior, depressed R shoulder, scapula and R ilia  UPPER EXTREMITY ROM:   A/PROM Right eval R 11/28/22 L 11/28/22  Shoulder flexion 20/157 passively with clunk A: 33 with excessive substitution  P: 132 150  Shoulder extension WNL 48 68  Shoulder abduction 58/177 A: 59 P: 160 155  Shoulder adduction     Shoulder internal rotation 55/62 A: 75 P: 65 85  Shoulder external rotation 65/85 very uncomfortable A: 61 P: 85 90  Elbow flexion full    Elbow extension full    (Blank rows = not tested)  UPPER EXTREMITY MMT:  MMT Right eval R 11/28/22 L 11/28/22  Shoulder flexion 2- 2- 5  Shoulder extension 4+ 4 4  Shoulder abduction 4-* with elbow bent 2- 4+  Shoulder adduction     Shoulder internal rotation 4- 4- 4+  Shoulder external rotation 5 4- 4  Middle trapezius     Lower trapezius     Elbow flexion 4* 4 5  Elbow extension 5 mild pain  4 4+  Wrist pronation     Wrist supination  Grip strength (lbs) 10 6 11   (Blank rows = not tested)   PALPATION:  Marked UT, pecs, ant shoulder, IS  Decreased stability in ant R shoulder joint    TODAY'S TREATMENT:                                                                                                                                         DATE:   11/28/22 THERAPEUTIC EXERCISE: to improve flexibility, strength and mobility.  Demonstration, verbal and tactile cues throughout for technique. Pulleys - R shoulder flexion & scaption x 3 min each, within pain-free ROM Seated shoulder rolls x 20 Seated scapular retraction 2 x 10  THERAPEUTIC ACTIVITIES: QuickDASH: 63.6 / 100 = 63.6 % UE ROM & MMT Goal assessment  MANUAL THERAPY: To promote normalized muscle tension, improved flexibility, improved joint mobility, increased ROM, and reduced pain.  Gentle R shoulder GH distraction with slight oscillation to reduce pain and muscle guarding Gentle R shoulder PROM in all planes   11/24/22 THERAPEUTIC EXERCISE: to improve flexibility, strength and mobility.  Demonstration, verbal and tactile cues throughout for technique. Pulleys - R shoulder flexion x 6 min Seated shoulder rolls to warm up scapula x 20 Standing rows YTB 2 x 10 Standing shoulder ext YTB x 10 Shoulder ER B and ER isometric step out - p!  Attempted to assess ROM but p!   11/02/22 THERAPEUTIC EXERCISE: to improve flexibility, strength and mobility.  Demonstration, verbal and tactile cues throughout for technique. Pulleys - R shoulder flexion x 3 min, avoiding forcing painful ROM Seated YTB scap retraction + shoulder row 10 x 5" Standing YTB scap retraction + shoulder extension 10 x 5" Standing YTB scap retraction + shoulder ER 10 x 5 Prone  W, T, Y 2x 5 more tolerable than 10 reps, also shoulder ext x 10    PATIENT EDUCATION: Education details: progress with PT and ongoing PT POC Person educated: Patient Education method: Explanation Education  comprehension: verbalized understanding  HOME EXERCISE PROGRAM: Access Code: [email or web address] URL: [email or web address] Date: 11/24/2022 Prepared by: Verta Ellen  Exercises - Seated Bilateral Shoulder Flexion Towel Slide at Table Top  - 1 x daily - 7 x weekly - 1 sets - 10 reps - Seated Shoulder Towel Slides Scaption at Table  - 1 x daily - 7 x weekly - 1 sets - 10 reps - Seated Scapular Retraction  - 1 x daily - 7 x weekly - 1-3 sets - 10 reps - 2-3 sec hold - Seated Scapular Retraction with External Rotation  - 1 x daily - 7 x weekly - 1 sets - 10 reps - Shoulder Flexion Wall Slide with Towel  - 1 x daily - 7 x weekly - 2 sets - 5 reps - Isometric Shoulder Flexion at Wall  - 1 x daily - 7 x weekly - 1 sets - 5 reps - 10 sec hold -  Standing Isometric Shoulder Internal Rotation at Doorway  - 1 x daily - 7 x weekly - 1 sets - 5 reps - 10 sec hold - Standing Isometric Shoulder External Rotation with Doorway (Mirrored)  - 1 x daily - 7 x weekly - 1 sets - 5 reps - 10 sec  hold - Standing Bilateral Low Shoulder Row with Anchored Resistance  - 1 x daily - 7 x weekly - 2 sets - 10 reps - 5 sec hold - Scapular Retraction with Resistance Advanced  - 1 x daily - 7 x weekly - 2 sets - 10 reps - 5 sec hold   ASSESSMENT:  CLINICAL IMPRESSION: [place] reports her R shoulder remains more irritable since her fall where she landed on her right side, and was further exacerbated today trying to reach behind her while in the car.  R shoulder pain interfering with sleep, with patient stating pain typically wakes her after 2 to 3 hours.  R shoulder AROM remains severely limited, with increased guarding also noted during PROM.  Progress with PT has been hindered by acute care hospitalization as well as recent fall and flareup of pain today.  Given limited PT visits due to hospitalization and rescheduling for MD appointments, will recommend recert for additional skilled PT 2x/wk for up to 6 to 8 weeks.  Also  encouraged patient to reschedule her follow-up visit with MD to see if any further diagnostic workup indicated.  OBJECTIVE IMPAIRMENTS: decreased ROM, decreased strength, increased muscle spasms, impaired flexibility, impaired UE functional use, postural dysfunction, and pain.   ACTIVITY LIMITATIONS: carrying, lifting, sleeping, bathing, toileting, dressing, self feeding, reach over head, and hygiene/grooming  PARTICIPATION LIMITATIONS: meal prep, cleaning, laundry, driving, and yard work  PERSONAL FACTORS: Time since onset of injury/illness/exacerbation and 3+ comorbidities: Myasthenia gravis, h/o radiation for mediastinal mass, HTN, OP  are also affecting patient's functional outcome.   REHAB POTENTIAL: Good  CLINICAL DECISION MAKING: Evolving/moderate complexity  EVALUATION COMPLEXITY: Moderate   GOALS: Goals reviewed with patient? Yes  SHORT TERM GOALS: Target date: 11/07/2022, extended to 12/26/2022  Patient will be independent with initial HEP.  Baseline:  Goal status: MET - 10/26/22  2.  Patient able to sleep without waking from shoulder pain  Baseline:  Goal status: IN PROGRESS - 11/28/22 - waking up after 2-3 hrs every night since fall  LONG TERM GOALS: Target date: 12/05/2022, extended to 01/23/2023  Patient will be independent with advanced/ongoing HEP to improve outcomes and carryover.  Baseline:  Goal status: IN PROGRESS - 11/28/22 - met for current HEP  2.  Patient will report 75% improvement in R shoulder pain to improve QOL.  Baseline:  Goal status: IN PROGRESS - 11/28/22 - pain increased since recent fall  3. Improved R grip strength by 5-10#  Baseline: 10# Goal status: IN PROGRESS - 11/28/22 - 6#  4.  Patient to improve R shoulder AROM to [facility] without pain provocation to allow for increased ease of ADLs.  Baseline:  Goal status: IN PROGRESS - 11/28/22 - R shoulder AROM still significantly limited in all planes  5.  Patient will demonstrate improved functional R UE  strength as demonstrated her ability to use her arm to perform grooming and hygiene. Baseline:  Goal status: IN PROGRESS - 11/28/22 - Pt remains unable to use R UE functionally with daily ADLs  6  Patient will report 58/100 on Quick Dash to demonstrate improved functional ability.  Baseline: 68.2 / 100 = 68.2 % Goal status: IN  PROGRESS  - 11/28/22 - 63.6 / 100 = 63.6 %   PLAN:  PT FREQUENCY: 2x/week  PT DURATION: 8 weeks  PLANNED INTERVENTIONS: Therapeutic exercises, Therapeutic activity, Neuromuscular re-education, Patient/Family education, Self Care, Joint mobilization, Aquatic Therapy, Dry Needling, Electrical stimulation, Spinal mobilization, Cryotherapy, Moist heat, Taping, Ionotophoresis 4mg /ml Dexamethasone, and Manual therapy  PLAN FOR NEXT SESSION: shoulder/RC strength and shoulder ROM; Scapular stabilization!; Review and update HEP as needed   Marry Guan, PT  11/28/2022, 4:50 PM

## 2022-11-29 ENCOUNTER — Encounter: Payer: Self-pay | Admitting: Thoracic Surgery (Cardiothoracic Vascular Surgery)

## 2022-11-29 ENCOUNTER — Ambulatory Visit (INDEPENDENT_AMBULATORY_CARE_PROVIDER_SITE_OTHER): Payer: Medicare HMO | Admitting: Thoracic Surgery (Cardiothoracic Vascular Surgery)

## 2022-11-29 VITALS — BP 158/78 | HR 92 | Resp 20 | Ht <= 58 in | Wt 111.0 lb

## 2022-11-29 DIAGNOSIS — C37 Malignant neoplasm of thymus: Secondary | ICD-10-CM

## 2022-11-29 DIAGNOSIS — Z09 Encounter for follow-up examination after completed treatment for conditions other than malignant neoplasm: Secondary | ICD-10-CM

## 2022-11-29 NOTE — Progress Notes (Signed)
[address]       Faith Massey [id number]             [phone]    HPI: Faith Massey returns for follow-up after previous thymectomy.  Faith Massey is a 77 year old woman with a history of hypertension, hyperlipidemia, breast cancer, osteoporosis, myasthenia gravis, and a thymoma.  She presented with myasthenia symptoms.  CT of the chest showed a mediastinal mass.  Acetylcholine receptor antibody titers were positive.  She underwent a robotic assisted thymectomy on 04/25/2022.  She did well and went home on day 2.  She did have extracapsular extension and underwent adjuvant radiation therapy with Dr. Roselind Messier.  She is also being followed by Dr. Myna Hidalgo.  She has an occasional pain on the right side but otherwise no issues related to her surgery.  Myasthenia symptoms improved significantly early on but if worsened a little bit since she developed a colovesical fistula.  She is going to require surgery for that.  Past Medical History:  Diagnosis Date   Breast CA [facility])    History of radiation therapy    Chest 06/09/2022 - 07/21/2022 - Dr. Antony Blackbird   Hyperlipidemia    Hypertension    Myasthenia gravis (HCC)    Dx'd 03/2022   Osteoporosis 10/12/2012     Current Outpatient Medications  Medication Sig Dispense Refill   apixaban (ELIQUIS) 5 MG TABS tablet Take 1 tablet (5 mg total) by mouth 2 (two) times daily. 180 tablet 1   aspirin EC 81 MG tablet Take 1 tablet (81 mg total) by mouth daily. Swallow whole. 30 tablet 12   cephALEXin (KEFLEX) 500 MG capsule Take 1 capsule (500 mg total) by mouth 2 (two) times daily. 60 capsule 0   Cholecalciferol (VITAMIN D-3) 5000 UNITS TABS Take 5,000 Units by mouth daily.     clotrimazole (MYCELEX) 10 MG troche Take 1 tablet (10 mg total) by mouth 5 (five) times daily. 35 Troche 1   COLLAGEN PO Take 1 Scoop by mouth daily.     ezetimibe (ZETIA) 10 MG tablet Take 1 tablet (10 mg total) by mouth daily. 30 tablet 3   lisinopril (ZESTRIL) 40 MG  tablet Take 1 tablet (40 mg total) by mouth daily. 90 tablet 3   metronidazole (NORITATE) 1 % cream Apply topically daily. Use as needed for skin breakout 60 g 0   Multiple Vitamins-Minerals (MULTIVITAMIN WITH MINERALS) tablet Take 1 tablet by mouth daily.     nystatin (MYCOSTATIN) 100000 UNIT/ML suspension Take 5 mLs (500,000 Units total) by mouth as needed (thrush). Swish, hold in mouth and spit up to 4x a day as needed 120 mL 2   pyridostigmine (MESTINON) 60 MG tablet Take 1 tablet (60 mg total) by mouth 3 (three) times daily. 90 tablet 6   Current Facility-Administered Medications  Medication Dose Route Frequency Provider Last Rate Last Admin   0.9 %  sodium chloride infusion  500 mL Intravenous Continuous Imogene Burn, MD        Physical Exam BP (!) 158/78 (BP Location: Left Arm, Patient Position: Sitting, Cuff Size: Normal)   Pulse 92   Resp 20   Ht 4\' 10"  (1.473 m)   Wt 111 lb (50.3 kg)   SpO2 94% Comment: RA  BMI 23.35 kg/m  77 year old woman in no acute distress Alert and oriented x 3 with no focal deficits Lungs clear bilaterally Incisions well-healed Cardiac regular rate and rhythm  Diagnostic Tests: CT CHEST  WITH CONTRAST   TECHNIQUE: Multidetector CT imaging of the chest was performed during intravenous contrast administration.   RADIATION DOSE REDUCTION: This exam was performed according to the departmental dose-optimization program which includes automated exposure control, adjustment of the mA and/or kV according to patient size and/or use of iterative reconstruction technique.   CONTRAST:  75mL OMNIPAQUE IOHEXOL 300 MG/ML  SOLN   COMPARISON:  Chest CTA 07/14/2022   FINDINGS: Cardiovascular: The heart size is normal. No substantial pericardial effusion. Mild atherosclerotic calcification is noted in the wall of the thoracic aorta.   Mediastinum/Nodes: No mediastinal lymphadenopathy. No abnormal or suspicious soft tissue in the anterior mediastinum  There is no hilar lymphadenopathy. Small hiatal hernia. The esophagus has normal imaging features. There is no axillary lymphadenopathy.   Lungs/Pleura: Bandlike chronic atelectasis or scarring noted in the right upper, middle and lower lobes. Atelectasis/scarring noted inferior lingula and posterior left lower lobe. No suspicious pulmonary nodule or mass. No focal airspace consolidation no pulmonary edema or pleural effusion.   Upper Abdomen: Visualized portion of the upper abdomen is unremarkable.   Musculoskeletal: No worrisome lytic or sclerotic osseous abnormality.   IMPRESSION: 1. No evidence for recurrent or metastatic disease in the chest. 2. Bandlike chronic atelectasis or scarring in both lungs. 3. Small hiatal hernia. 4.  Aortic Atherosclerosis (ICD10-I70.0).     Electronically Signed   By: Kennith Massey M.D.   On: 10/21/2022 12:39 I personally reviewed the chest x-ray images.  Postop changes.  No evidence of recurrence.  Impression: Faith Massey is a 77 year old woman with a history of hypertension, hyperlipidemia, breast cancer, osteoporosis, myasthenia gravis, and a thymoma.  She presented with myasthenia symptoms.  CT of the chest showed a mediastinal mass.  Acetylcholine receptor antibody titers were positive.  She underwent a robotic assisted thymectomy on 04/25/2022.  She did well and went home on day 2.  She did have extracapsular extension and underwent adjuvant radiation therapy with Dr. Roselind Messier.  She is also being followed by Dr. Myna Hidalgo.  Thymoma-status post thymectomy followed by adjuvant radiation.  No evidence of recurrent disease.  Myasthenia symptoms improved dramatically initially but have gotten worse since she has been ill with pneumonia and colovesical fistula.  Doing well from a surgical standpoint with minimal discomfort.  Plan: Follow-up with Dr. Myna Hidalgo I will be happy to see Faith Massey back at anytime in the future if I can be of any further  assistance with her care  Loreli Slot, MD [facility] [phone]

## 2022-12-01 ENCOUNTER — Encounter: Payer: Self-pay | Admitting: Orthopaedic Surgery

## 2022-12-01 ENCOUNTER — Ambulatory Visit (INDEPENDENT_AMBULATORY_CARE_PROVIDER_SITE_OTHER): Payer: Medicare HMO | Admitting: Orthopaedic Surgery

## 2022-12-01 DIAGNOSIS — S46211A Strain of muscle, fascia and tendon of other parts of biceps, right arm, initial encounter: Secondary | ICD-10-CM

## 2022-12-01 DIAGNOSIS — M25511 Pain in right shoulder: Secondary | ICD-10-CM | POA: Diagnosis not present

## 2022-12-01 DIAGNOSIS — G8929 Other chronic pain: Secondary | ICD-10-CM

## 2022-12-01 NOTE — Progress Notes (Signed)
Office Visit Note   Patient: Faith Massey           Date of Birth: [birth year]           MRN: [id number] Visit Date: 12/01/2022              Requested by: Pearline Cables, MD [address] [place],  [place] [address] PCP: Pearline Cables, MD   Assessment & Plan: Visit Diagnoses:  1. Chronic right shoulder pain   2. 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  Given her age and baseline activity level I explained that it would be best to continue to do physical therapy for strengthening of the shoulder to compensate for the rotator cuff insufficiency.  I would not rush to a reverse shoulder replacement which would be her next option.  It sounds like she needs some sort of a fistula surgery which sounds a lot more urgent and important which she agreed with.  For now she is going to take care of the fistula issue and she will follow-up with [place] for the shoulder as needed.  Follow-Up Instructions: No follow-ups on file.   Orders:  Orders Placed This Encounter  Procedures   Ambulatory referral to Physical Therapy   No orders of the defined types were placed in this encounter.     Procedures: No procedures performed   Clinical Data: No additional findings.   Subjective: Chief Complaint  Patient presents with   Right Shoulder - Pain    HPI Faith Massey is a 77 year old female who comes in for evaluation of right shoulder pain.  She saw [place] back in July for shoulder pain and proximal biceps rupture.  We sent her to physical therapy and Dr. Shon Baton for glenohumeral injection.  Overall she feels like her pain is improved but she feels continued weakness in the shoulder.  Review of Systems  Constitutional: Negative.   HENT: Negative.    Eyes: Negative.   Respiratory: Negative.    Cardiovascular: Negative.   Endocrine: Negative.    Musculoskeletal: Negative.   Neurological: Negative.   Hematological: Negative.   Psychiatric/Behavioral: Negative.    All other systems reviewed and are negative.    Objective: Vital Signs: There were no vitals taken for this visit.  Physical Exam Vitals and nursing note reviewed.  Constitutional:      Appearance: She is well-developed.  HENT:     Head: Atraumatic.     Nose: Nose normal.  Eyes:     Extraocular Movements: Extraocular movements intact.  Cardiovascular:     Pulses: Normal pulses.  Pulmonary:     Effort: Pulmonary effort is normal.  Abdominal:     Palpations: Abdomen is soft.  Musculoskeletal:     Cervical back: Neck supple.  Skin:    General: Skin is warm.     Capillary Refill: Capillary refill takes less than 2 seconds.  Neurological:     Mental Status: She is alert. Mental status is at baseline.  Psychiatric:        Behavior: Behavior normal.        Thought Content: Thought content normal.        Judgment: Judgment normal.     Ortho Exam Examination of the right shoulder shows a small subtle Popeye deformity.  She has 3 out of 5 strength to  manual muscle testing of the supraspinatus infraspinatus and subscap.  She has fluid glenohumeral range of motion. Specialty Comments:  No specialty comments available.  Imaging: No results found.   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 Antony Blackbird   Hyperlipidemia    Hypertension    Myasthenia gravis (HCC)    Dx'd 03/2022   Osteoporosis 10/12/2012    Family History  Problem Relation Age of Onset   Stroke Mother    Hypertension Mother    Myasthenia gravis Mother    Hypertension Father    Colon cancer Neg Hx    Esophageal cancer Neg Hx     Past Surgical History:  Procedure Laterality Date   ABDOMINAL HYSTERECTOMY  03/22/2003   BACK SURGERY  03/21/1998   BREAST SURGERY     MASTECTOMY Bilateral 03/21/1993   Social History   Occupational History   Not on file  Tobacco Use   Smoking status: Never   Smokeless tobacco: Never  Vaping Use   Vaping status: Never Used  Substance and Sexual Activity   Alcohol use: Not Currently    Alcohol/week: 1.0 standard drink of alcohol    Types: 1 Glasses of wine per week    Comment: occasional   Drug use: No   Sexual activity: Not Currently

## 2022-12-02 ENCOUNTER — Encounter: Payer: Self-pay | Admitting: Family Medicine

## 2022-12-07 ENCOUNTER — Encounter: Payer: Medicare HMO | Admitting: Internal Medicine

## 2022-12-07 ENCOUNTER — Ambulatory Visit: Payer: Medicare HMO

## 2022-12-07 DIAGNOSIS — R252 Cramp and spasm: Secondary | ICD-10-CM | POA: Diagnosis not present

## 2022-12-07 DIAGNOSIS — M25511 Pain in right shoulder: Secondary | ICD-10-CM

## 2022-12-07 DIAGNOSIS — M25611 Stiffness of right shoulder, not elsewhere classified: Secondary | ICD-10-CM | POA: Diagnosis not present

## 2022-12-07 DIAGNOSIS — G8929 Other chronic pain: Secondary | ICD-10-CM | POA: Diagnosis not present

## 2022-12-07 DIAGNOSIS — M6281 Muscle weakness (generalized): Secondary | ICD-10-CM

## 2022-12-07 DIAGNOSIS — S46211A Strain of muscle, fascia and tendon of other parts of biceps, right arm, initial encounter: Secondary | ICD-10-CM | POA: Diagnosis not present

## 2022-12-07 NOTE — Therapy (Signed)
OUTPATIENT PHYSICAL THERAPY TREATMENT       Patient Name: Faith Massey MRN: [id number] DOB:[birth year], 77 y.o., female Today's Date: 12/07/2022  END OF SESSION:  PT End of Session - 12/07/22 1218     Visit Number 8    Date for PT Re-Evaluation 01/23/23    Authorization Type [employer] MCR    Progress Note Due on Visit 10    PT Start Time 1148    PT Stop Time 1232    PT Time Calculation (min) 44 min    Activity Tolerance Patient tolerated treatment well    Behavior During Therapy [facility] for tasks assessed/performed                   Past Medical History:  Diagnosis Date   Breast CA (HCC)    History of radiation therapy    Chest 06/09/2022 - 07/21/2022 - Dr. 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   Myasthenic syndrome (HCC) 04/12/2022   CVA (cerebral vascular accident) (HCC) 03/28/2022   HTN (hypertension) 03/28/2022   Chest mass 03/28/2022   Right knee injury 02/14/2017   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 10/16/2012   Osteoporosis 10/12/2012    PCP: Pearline Cables, MD  REFERRING PROVIDER: Cristie Hem, PA-C  REFERRING DIAG: [phone] (ICD-10-CM) - Chronic right shoulder pain  THERAPY DIAG:  Acute pain of right shoulder  Stiffness of right shoulder, not elsewhere classified  Muscle weakness (generalized)  Cramp and spasm  RATIONALE FOR EVALUATION AND TREATMENT: Rehabilitation  ONSET DATE:  06/20/22  NEXT MD VISIT: pending reschedule   SUBJECTIVE:                                                                                                                                                                                      SUBJECTIVE STATEMENT: Pt reports that she feels that she is doing better today.   EVAL:  I had so much pain last night I had to take a Tramadol. I can't get a blouse out of the closet. Hurts to reach out. Can't  reach behind head and can't lift arm without support of other arm. A little better since injection last week. Just recently went off prednisone which she had for IVIG therapy. Diagnosed with Myasthenia gravis in January of this year (associated with the mass). 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 Tore biceps when she was changing the filter on her water filtering system, heard pop. She uses cane intermittently due to balance issues from Myasthenia gravis and prednisone.  Hand dominance: Right  PAIN:  Are you having pain? Yes: NPRS scale: 2-3/10 Pain location: anterior R shoulder  Pain description: constant dull ache Aggravating factors: reaching forward, trying to lift Relieving factors: Tramadol  PERTINENT HISTORY: MG, h/o radiation (March and May 2024) for mediastinal mass, HTN, OP  PRECAUTIONS: None  RED FLAGS: None   WEIGHT BEARING RESTRICTIONS: No  FALLS:  Has patient fallen in last 6 months? No  LIVING ENVIRONMENT: Lives with: lives alone Lives in: House/apartment Stairs: No Has following equipment at home: Single point cane  OCCUPATION: retired  PLOF: Independent with household mobility with device  PATIENT GOALS:full function of her R arm   OBJECTIVE:   DIAGNOSTIC FINDINGS:  [place]: Limited musculoskeletal ultrasound of the right upper extremity, right  shoulder was performed.  Short axis evaluation of the bicipital groove  shows no cortical irregularity of the humeral head or groove of the  shoulder.  Within  the bicipital groove there is a hypoechoic space with  absent visualization of the proximal head of the biceps tendon, indicative  of likely proximal bicep tendon tear.  Short and long axis evaluation of  the biceps tendon does show retraction into the proximal aspect of the  bicep musculature.  Evaluation of the [facility] joint shows at least moderate  arthritic changes without significant bursal distention.   PATIENT SURVEYS:  Quick Dash 68.2 / 100 = 68.2 % ; 11/28/22 - 63.6 / 100 = 63.6 %  COGNITION: Overall cognitive status: Within functional limits for tasks assessed     SENSATION: WFL  POSTURE: R shoulder anterior, depressed R shoulder, scapula and R ilia  UPPER EXTREMITY ROM:   A/PROM Right eval R 11/28/22 L 11/28/22  Shoulder flexion 20/157 passively with clunk A: 33 with excessive substitution  P: 132 150  Shoulder extension WNL 48 68  Shoulder abduction 58/177 A: 59 P: 160 155  Shoulder adduction     Shoulder internal rotation 55/62 A: 75 P: 65 85  Shoulder external rotation 65/85 very uncomfortable A: 61 P: 85 90  Elbow flexion full    Elbow extension full    (Blank rows = not tested)  UPPER EXTREMITY MMT:  MMT Right eval R 11/28/22 L 11/28/22  Shoulder flexion 2- 2- 5  Shoulder extension 4+ 4 4  Shoulder abduction 4-* with elbow bent 2- 4+  Shoulder adduction     Shoulder internal rotation 4- 4- 4+  Shoulder external rotation 5 4- 4  Middle trapezius     Lower trapezius     Elbow flexion 4* 4 5  Elbow extension 5 mild pain  4 4+  Wrist pronation     Wrist supination     Grip strength (lbs) 10 6 11   (Blank rows = not tested)   PALPATION:  Marked UT, pecs, ant shoulder, IS  Decreased stability in ant R shoulder joint    TODAY'S TREATMENT:  DATE:  12/07/22 THERAPEUTIC EXERCISE: to improve flexibility, strength and mobility.   Demonstration, verbal and tactile cues throughout for technique. Pulleys - R shoulder flexion & scaption x 3 min each, within pain-free ROM Standing rows RTB x 10  Standing shoulder extension RTB x 10  Isometric ER step out with YTB 2x5 - challenging; IR with YTB x 5  Standing scapular retraction + ER back to doorframe  Gentle PROM to R shoulder all directions  11/28/22 THERAPEUTIC EXERCISE: to improve flexibility, strength and mobility.  Demonstration, verbal and tactile cues throughout for technique. Pulleys - R shoulder flexion & scaption x 3 min each, within pain-free ROM Seated shoulder rolls x 20 Seated scapular retraction 2 x 10  THERAPEUTIC ACTIVITIES: QuickDASH: 63.6 / 100 = 63.6 % UE ROM & MMT Goal assessment  MANUAL THERAPY: To promote normalized muscle tension, improved flexibility, improved joint mobility, increased ROM, and reduced pain.  Gentle R shoulder GH distraction with slight oscillation to reduce pain and muscle guarding Gentle R shoulder PROM in all planes   11/24/22 THERAPEUTIC EXERCISE: to improve flexibility, strength and mobility.  Demonstration, verbal and tactile cues throughout for technique. Pulleys - R shoulder flexion x 6 min Seated shoulder rolls to warm up scapula x 20 Standing rows YTB 2 x 10 Standing shoulder ext YTB x 10 Shoulder ER B and ER isometric step out - p!  Attempted to assess ROM but p!   11/02/22 THERAPEUTIC EXERCISE: to improve flexibility, strength and mobility.  Demonstration, verbal and tactile cues throughout for technique. Pulleys - R shoulder flexion x 3 min, avoiding forcing painful ROM Seated YTB scap retraction + shoulder row 10 x 5" Standing YTB scap retraction + shoulder extension 10 x 5" Standing YTB scap retraction + shoulder ER 10 x 5 Prone  W, T, Y 2x 5 more tolerable than 10 reps, also shoulder ext x 10    PATIENT EDUCATION: Education details: HEP update - isometric ER step out Person educated:  Patient Education method: Explanation Education comprehension: verbalized understanding  HOME EXERCISE PROGRAM: Access Code: [email or web address] URL: [email or web address] Date: 12/07/2022 Prepared by: Verta Ellen  Exercises - Seated Bilateral Shoulder Flexion Towel Slide at Table Top  - 1 x daily - 7 x weekly - 1 sets - 10 reps - Seated Shoulder Towel Slides Scaption at Table  - 1 x daily - 7 x weekly - 1 sets - 10 reps - Seated Scapular Retraction  - 1 x daily - 7 x weekly - 1-3 sets - 10 reps - 2-3 sec hold - Seated Scapular Retraction with External Rotation  - 1 x daily - 7 x weekly - 1 sets - 10 reps - Shoulder Flexion Wall Slide with Towel  - 1 x daily - 7 x weekly - 2 sets - 5 reps - Isometric Shoulder Flexion at Wall  - 1 x daily - 7 x weekly - 1 sets - 5 reps - 10 sec hold - Standing Isometric Shoulder Internal Rotation at Doorway  - 1 x daily - 7 x weekly - 1 sets - 5 reps - 10 sec hold - Standing Isometric Shoulder External Rotation with Doorway (Mirrored)  - 1 x daily - 7 x weekly - 1 sets - 5 reps - 10 sec  hold - Standing Bilateral Low Shoulder Row with Anchored Resistance  - 1 x daily - 7 x weekly - 2 sets - 10 reps - 5 sec hold - Scapular Retraction with Resistance Advanced  - 1 x  daily - 7 x weekly - 2 sets - 10 reps - 5 sec hold - Shoulder external rotation  - 1 x daily - 3 x weekly - 2 sets - 5 reps   ASSESSMENT:  CLINICAL IMPRESSION: Despite subjective reports of improved pain today pt remains unable to raise R arm w/o assistance. Focused primarily on scapular stabilization and postural re-education with cues during session to stabilize scapula and fully engage to improve strength. Able to progress to RTB for rows and extensions. Added isometric step outs for ER, however difficulty keep arm in neutral (may need to stick to wall version if this continues).  OBJECTIVE IMPAIRMENTS: decreased ROM, decreased strength, increased muscle spasms, impaired flexibility,  impaired UE functional use, postural dysfunction, and pain.   ACTIVITY LIMITATIONS: carrying, lifting, sleeping, bathing, toileting, dressing, self feeding, reach over head, and hygiene/grooming  PARTICIPATION LIMITATIONS: meal prep, cleaning, laundry, driving, and yard work  PERSONAL FACTORS: Time since onset of injury/illness/exacerbation and 3+ comorbidities: Myasthenia gravis, h/o radiation for mediastinal mass, HTN, OP  are also affecting patient's functional outcome.   REHAB POTENTIAL: Good  CLINICAL DECISION MAKING: Evolving/moderate complexity  EVALUATION COMPLEXITY: Moderate   GOALS: Goals reviewed with patient? Yes  SHORT TERM GOALS: Target date: 11/07/2022, extended to 12/26/2022  Patient will be independent with initial HEP.  Baseline:  Goal status: MET - 10/26/22  2.  Patient able to sleep without waking from shoulder pain  Baseline:  Goal status: IN PROGRESS - 12/07/22 - sleeping through the night but with medications  LONG TERM GOALS: Target date: 12/05/2022, extended to 01/23/2023  Patient will be independent with advanced/ongoing HEP to improve outcomes and carryover.  Baseline:  Goal status: IN PROGRESS - 11/28/22 - met for current HEP  2.  Patient will report 75% improvement in R shoulder pain to improve QOL.  Baseline:  Goal status: IN PROGRESS - 11/28/22 - pain increased since recent fall  3. Improved R grip strength by 5-10#  Baseline: 10# Goal status: IN PROGRESS - 11/28/22 - 6#  4.  Patient to improve R shoulder AROM to [facility] without pain provocation to allow for increased ease of ADLs.  Baseline:  Goal status: IN PROGRESS - 11/28/22 - R shoulder AROM still significantly limited in all planes  5.  Patient will demonstrate improved functional R UE strength as demonstrated her ability to use her arm to perform grooming and hygiene. Baseline:  Goal status: IN PROGRESS - 11/28/22 - Pt remains unable to use R UE functionally with daily ADLs  6  Patient will report  58/100 on Quick Dash to demonstrate improved functional ability.  Baseline: 68.2 / 100 = 68.2 % Goal status: IN PROGRESS  - 11/28/22 - 63.6 / 100 = 63.6 %   PLAN:  PT FREQUENCY: 2x/week  PT DURATION: 8 weeks  PLANNED INTERVENTIONS: Therapeutic exercises, Therapeutic activity, Neuromuscular re-education, Patient/Family education, Self Care, Joint mobilization, Aquatic Therapy, Dry Needling, Electrical stimulation, Spinal mobilization, Cryotherapy, Moist heat, Taping, Ionotophoresis 4mg /ml Dexamethasone, and Manual therapy  PLAN FOR NEXT SESSION: shoulder/RC strength and shoulder ROM; Scapular stabilization!; Review and update HEP as needed   Darleene Cleaver, PTA  12/07/2022, 12:40 PM

## 2022-12-08 ENCOUNTER — Inpatient Hospital Stay: Payer: Medicare HMO

## 2022-12-08 ENCOUNTER — Inpatient Hospital Stay: Payer: Medicare HMO | Admitting: Hematology & Oncology

## 2022-12-09 ENCOUNTER — Encounter: Payer: Self-pay | Admitting: Family Medicine

## 2022-12-09 DIAGNOSIS — N321 Vesicointestinal fistula: Secondary | ICD-10-CM

## 2022-12-12 ENCOUNTER — Ambulatory Visit: Payer: Medicare HMO

## 2022-12-13 ENCOUNTER — Telehealth: Payer: Self-pay | Admitting: Internal Medicine

## 2022-12-13 NOTE — Telephone Encounter (Signed)
Called the patient. No answer. Left her a message of my call.

## 2022-12-13 NOTE — Telephone Encounter (Signed)
Inbound call from patient stating since having her colonoscopy on 9/4 she has been very bloated and has been having diarrhea. Patient requesting a call back to discuss options to help. Please advise, thank you.

## 2022-12-14 ENCOUNTER — Ambulatory Visit: Payer: Medicare HMO | Admitting: Physical Therapy

## 2022-12-14 DIAGNOSIS — S46211A Strain of muscle, fascia and tendon of other parts of biceps, right arm, initial encounter: Secondary | ICD-10-CM

## 2022-12-14 DIAGNOSIS — R252 Cramp and spasm: Secondary | ICD-10-CM | POA: Diagnosis not present

## 2022-12-14 DIAGNOSIS — G8929 Other chronic pain: Secondary | ICD-10-CM | POA: Diagnosis not present

## 2022-12-14 DIAGNOSIS — M6281 Muscle weakness (generalized): Secondary | ICD-10-CM | POA: Diagnosis not present

## 2022-12-14 DIAGNOSIS — M25611 Stiffness of right shoulder, not elsewhere classified: Secondary | ICD-10-CM | POA: Diagnosis not present

## 2022-12-14 DIAGNOSIS — M25511 Pain in right shoulder: Secondary | ICD-10-CM | POA: Diagnosis not present

## 2022-12-14 NOTE — Telephone Encounter (Signed)
Options offered to the patient.  She will consider trying Gas-X or Bentyl and let me know.

## 2022-12-14 NOTE — Telephone Encounter (Signed)
Called the patient. No answer. Left message to call back.

## 2022-12-14 NOTE — Telephone Encounter (Signed)
Some off and on loose stools with mucous. No blood has been seen. She feels very bloated. Her stools may occur first in the morning or late in the day. She has not had a stool today and she has been to physical therapy. She does not take any supplements except a probiotic. She takes this because of the antibiotic she has been on long term. Her appointment with the surgeon Dr Maisie Fus is 12/19/22.

## 2022-12-15 ENCOUNTER — Encounter: Payer: Medicare HMO | Admitting: Urology

## 2022-12-15 DIAGNOSIS — G709 Myoneural disorder, unspecified: Secondary | ICD-10-CM | POA: Diagnosis not present

## 2022-12-15 DIAGNOSIS — G7001 Myasthenia gravis with (acute) exacerbation: Secondary | ICD-10-CM | POA: Diagnosis not present

## 2022-12-15 NOTE — Progress Notes (Deleted)
Assessment: 1. Colovesical fistula   2. History of UTI   3. Pneumaturia      Plan: I personally reviewed the patient's chart including provider notes, lab and imaging results.   Chief Complaint: No chief complaint on file.   History of Present Illness:  Faith Massey is a 77 y.o. female who is seen in consultation from Copland, Gwenlyn Found, MD for evaluation of colovesical fistula. She has a recent history of pneumaturia for for several weeks.  She was seen by Dr. Patsy Lager in late June 2024 for evaluation. Urine culture at that time grew <10K colonies of a single gram-negative organism. CT abdomen and pelvis from 09/21/2022 showed a small amount of air in the nondependent portion of the urinary bladder, focal irregular thickening of the left posterior inferior bladder wall with asymmetric prominence of the left vaginal cuff and circumferential thickening of the proximal sigmoid colon consistent with possible colovesical fistula. She was not having any gross hematuria or dysuria.  No known history of diverticulitis. She was seen by Dr. Maisie Fus at [facility] for surgical consultation on 10/17/2022.  Possible robotic assisted laparoscopic partial colectomy discussed for management.  Further evaluation with colonoscopy was recommended. She underwent colonoscopy on 11/23/2022.  Review of the procedure note did not indicate that any obvious fistulous tract noted. She developed a fever with malaise as well as abdominal pain in late August 2024.  CT imaging at that time demonstrated patchy areas of hypoattenuation throughout the right renal parenchyma with wall enhancement of the proximal right ureter, no hydronephrosis.  No gas was seen within the bladder.  A small amount of bladder wall thickening on the left with an adjacent prominent diverticulum and vaginal cuff again noted. She was admitted to the hospital for treatment of pyelonephritis.  No urine culture was obtained on admission.  Blood  cultures grew Enterobacter and E. coli. Urine culture from 11/17/2022 showed no growth.   Past Medical History:  Past Medical History:  Diagnosis Date   Breast CA [facility])    History of radiation therapy    Chest 06/09/2022 - 07/21/2022 - Dr. Antony Blackbird   Hyperlipidemia    Hypertension    Myasthenia gravis (HCC)    Dx'd 03/2022   Osteoporosis 10/12/2012    Past Surgical History:  Past Surgical History:  Procedure Laterality Date   ABDOMINAL HYSTERECTOMY  03/22/2003   BACK SURGERY  03/21/1998   BREAST SURGERY     MASTECTOMY Bilateral 03/21/1993    Allergies:  Allergies  Allergen Reactions   Prednisone Shortness Of Breath, Swelling, Palpitations, Dermatitis and Hypertension    Patient reports having an intolerance to medication. Reports medication caused swelling and metallic taste in mouth.    Family History:  Family History  Problem Relation Age of Onset   Stroke Mother    Hypertension Mother    Myasthenia gravis Mother    Hypertension Father    Colon cancer Neg Hx    Esophageal cancer Neg Hx     Social History:  Social History   Tobacco Use   Smoking status: Never   Smokeless tobacco: Never  Vaping Use   Vaping status: Never Used  Substance Use Topics   Alcohol use: Not Currently    Alcohol/week: 1.0 standard drink of alcohol    Types: 1 Glasses of wine per week    Comment: occasional   Drug use: No    Review of symptoms:  Constitutional:  Negative for unexplained weight loss, night sweats, fever, chills  ENT:  Negative for nose bleeds, sinus pain, painful swallowing CV:  Negative for chest pain, shortness of breath, exercise intolerance, palpitations, loss of consciousness Resp:  Negative for cough, wheezing, shortness of breath GI:  Negative for nausea, vomiting, diarrhea, bloody stools GU:  Positives noted in HPI; otherwise negative for gross hematuria, dysuria, urinary incontinence Neuro:  Negative for seizures, poor balance, limb weakness, slurred  speech Psych:  Negative for lack of energy, depression, anxiety Endocrine:  Negative for polydipsia, polyuria, symptoms of hypoglycemia (dizziness, hunger, sweating) Hematologic:  Negative for anemia, purpura, petechia, prolonged or excessive bleeding, use of anticoagulants  Allergic:  Negative for difficulty breathing or choking as a result of exposure to anything; no shellfish allergy; no allergic response (rash/itch) to materials, foods  Physical exam: There were no vitals taken for this visit. GENERAL APPEARANCE:  Well appearing, well developed, well nourished, NAD HEENT: Atraumatic, Normocephalic, oropharynx clear. NECK: Supple without lymphadenopathy or thyromegaly. LUNGS: Clear to auscultation bilaterally. HEART: Regular Rate and Rhythm without murmurs, gallops, or rubs. ABDOMEN: Soft, non-tender, No Masses. EXTREMITIES: Moves all extremities well.  Without clubbing, cyanosis, or edema. NEUROLOGIC:  Alert and oriented x 3, normal gait, CN II-XII grossly intact.  MENTAL STATUS:  Appropriate. BACK:  Non-tender to palpation.  No CVAT SKIN:  Warm, dry and intact.    Results: U/A:  CYSTOSCOPY  Procedure: Flexible cystoscopy  Pre-Operative Diagnosis:  {cysto diagnosis:26394}  Post-Operative Diagnosis: {cysto diagnosis:26394}  Anesthesia: local with lidocaine gel  Surgical Narrative:  After appropriate informed consent was obtained, the patient was prepped and draped in the usual sterile fashion in the supine position. She was correctly identified and the proper procedure delineated prior to proceeding. Sterile lidocaine gel was instilled in the urethra.  The flexible cystoscope was introduced without difficulty.  Findings:  Urethra: {anterior urethral findings:26395}  Bladder: {bladder findings:26397}  Ureteral orifices: {Normal/Abnormal Appearance:21344::"normal"}  Additional findings: ***  A bladder wash {WAS/WAS NOT:[phone]::"was not"} obtained for  cytology.  A Marshall test {WAS/WAS NOT:[phone]::"was not"} performed. Stress urinary incontinence {WAS/WAS NOT:[phone]::"was not"} present with cough valsalva. Volume was {Severity brief:12472}  Pelvic exam {WAS/WAS NOT:[phone]::"was not"} positive for pelvic prolapse.  She tolerated the procedure well.  A chaperone was present throughout the procedure.

## 2022-12-16 DIAGNOSIS — G7001 Myasthenia gravis with (acute) exacerbation: Secondary | ICD-10-CM | POA: Diagnosis not present

## 2022-12-16 DIAGNOSIS — G709 Myoneural disorder, unspecified: Secondary | ICD-10-CM | POA: Diagnosis not present

## 2022-12-19 ENCOUNTER — Ambulatory Visit: Payer: Self-pay | Admitting: General Surgery

## 2022-12-19 DIAGNOSIS — N321 Vesicointestinal fistula: Secondary | ICD-10-CM | POA: Diagnosis not present

## 2022-12-19 NOTE — H&P (Signed)
??RRING PHYSICIAN:  Copland, Karn Pickler*   PROVIDER:  Elenora Gamma, MD   MRN: [id number] DOB: May 26, 1945 DATE OF ENCOUNTER: 12/19/2022   Subjective   Chief Complaint: recheck (colovaginal fistula,)       History of Present Illness: Faith Massey is a 77 y.o. female who is seen today as an office consultation at the request of Dr. 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 Patient seen by primary care physician with concern of air in her bladder.  She denies any pain or blood in her urine.  She denies any difficulty with bowel habits.  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  She has a history of a stroke, CAD and a remote history of breast cancer.  Colonoscopy was repeated recently and did not show a malignancy. 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  She was recently hospitalized with severe pyelonephritis.      Review of Systems: A complete review of systems was obtained from the patient.  I have reviewed this information and discussed as appropriate with the patient.  See HPI as well for other ROS.       Medical History: Past Medical History Past Medical History: Diagnosis Date  Arthritis    Breast cancer (CMS/HHS-HCC)    CVA (cerebral vascular accident) (CMS/HHS-HCC)    Hypertension    Myasthenia gravis (CMS/HHS-HCC)    Thymoma        Problem List Patient Active Problem List Diagnosis  Colovesical fistula  CVA (cerebral vascular accident) (CMS/HHS-HCC)  HTN (hypertension)  Myasthenic syndrome (CMS/HHS-HCC)  Prediabetes  Type B2 thymoma (CMS/HHS-HCC)      Past Surgical History Past Surgical History: Procedure Laterality Date  MASS REMOVAL    04/25/2022  BACK SURGERY       BREAST SURGERY  Bilateral     DOUBLE MASTY      Allergies Allergies Allergen Reactions  Prednisone Dermatitis, Palpitations, Shortness Of Breath and Swelling     Patient  reports having an intolerance to medication. Reports medication caused swelling and metallic taste in mouth.      Medications Ordered Prior to Encounter Current Outpatient Medications on File Prior to Visit Medication Sig Dispense Refill  apixaban (ELIQUIS) 5 mg tablet Take 5 mg by mouth 2 (two) times daily      aspirin 81 MG EC tablet Take 81 mg by mouth once daily      cephalexin (KEFLEX) 250 MG capsule        cholecalciferol, vitamin D3, (VITAMIN D3) 125 mcg (5,000 unit) tablet Take 5,000 Units by mouth once daily      COLLAGEN MISC Take by mouth      lisinopriL (ZESTRIL) 40 MG tablet Take 40 mg by mouth once daily      multivitamin with minerals tablet Take 1 tablet by mouth once daily      pyRIDostigmine (MESTINON) 60 mg tablet Take 60 mg by mouth 3 (three) times daily      traMADoL (ULTRAM) 50 mg tablet Take by mouth        No current facility-administered medications on file prior to visit.      Family History History reviewed. No pertinent family history.     Tobacco Use History Social History    Tobacco Use Smoking Status Never Smokeless Tobacco Never      Social History Social History    Socioeconomic History  Marital status: Divorced Tobacco Use  Smoking status: Never  Smokeless tobacco: Never Vaping Use  Vaping status: Never Used Substance and Sexual Activity  Alcohol use: Not Currently  Drug use: Never    Social Determinants of Health    Financial Resource Strain: Medium Risk (06/21/2022)   Received from [facility]   Overall Financial Resource Strain (CARDIA)    Difficulty of Paying Living Expenses: Somewhat hard Food Insecurity: Food Insecurity Present (11/11/2022)   Received from [facility]   Hunger Vital Sign    Worried About Running Out of Food in the Last Year: Sometimes true    Ran Out of Food in the Last Year: Never true Transportation Needs: No Transportation Needs (11/11/2022)   Received from [facility] -  Transportation    Lack of Transportation (Medical): No    Lack of Transportation (Non-Medical): No Physical Activity: Insufficiently Active (06/21/2022)   Received from [facility]   Exercise Vital Sign    Days of Exercise per Week: 5 days    Minutes of Exercise per Session: 20 min Stress: Stress Concern Present (06/21/2022)   Received from [facility] - Occupational Stress Questionnaire    Feeling of Stress : To some extent Social Connections: Moderately Integrated (06/21/2022)   Received from [facility]   Social Connection and Isolation Panel [NHANES]    Frequency of Communication with Friends and Family: More than three times a week    Frequency of Social Gatherings with Friends and Family: More than three times a week    Attends Religious Services: More than 4 times per year    Active Member of [employer] or Organizations: Yes    Attends Engineer, structural: More than 4 times per year    Marital Status: Divorced      Objective:     Vitals:   12/19/22 0943 BP: 118/74 Pulse: (!) 116 Temp: 36.8 C (98.3 F) SpO2: 96% Weight: 50.1 kg (110 lb 6.4 oz) Height: 148.6 cm (4' 10.5") PainSc: 0-No pain     Exam Gen: NAD Abd: soft       Labs, Imaging and Diagnostic Testing: CT reviewed       Assessment and Plan: Diagnoses and all orders for this visit:   Colovesical fistula -     polyethylene glycol (MIRALAX) powder; Take 233.75 g by mouth once for 1 dose Take according to your procedure prep instructions. -     bisacodyL (DULCOLAX) 5 mg EC tablet; Take 4 tablets (20 mg total) by mouth once daily as needed for Constipation for up to 1 dose -     metroNIDAZOLE (FLAGYL) 500 MG tablet; Take 2 tablets (1,000 mg total) by mouth 3 (three) times daily for 3 doses Take according to your procedure colon prep instructions -     neomycin 500 mg tablet; Take 2 tablets (1,000 mg total) by mouth 3 (three) times daily for 3 doses Take  according to your procedure colon prep instructions       77 year old female who presents to the office after complete workup of a colovesicular fistula.  Colonoscopy reveals no malignancy at the site.  I have recommended robotic assisted partial colectomy.  Upon review of the CT scan this appears to be close to the left ureteral opening into the bladder.  I have contacted a colleague urologist to evaluate this CT further and determine a course of action for surgical treatment.  Once I hear back from him, we will plan the appropriate surgery.  The surgery and anatomy were described to the patient as well as the risks of surgery and the possible complications.  These include: Bleeding, deep abdominal infections and possible wound complications such as hernia and infection, damage to adjacent structures, leak of surgical connections, which can lead to other surgeries and possibly an ostomy, possible need for other procedures, such as abscess drains in radiology, possible prolonged hospital stay, possible diarrhea from removal of part of the colon, possible constipation from narcotics, possible bowel, bladder or sexual dysfunction if having rectal surgery, prolonged fatigue/weakness or appetite loss, possible early recurrence of of disease, possible complications of their medical problems such as heart disease or arrhythmias or lung problems, death (less than 1%). I believe the patient understands and wishes to proceed with the surgery.    Vanita Panda, MD Colon and Rectal Surgery [facility]

## 2022-12-20 ENCOUNTER — Ambulatory Visit: Payer: Medicare HMO | Attending: Physician Assistant

## 2022-12-20 ENCOUNTER — Telehealth: Payer: Self-pay | Admitting: Family Medicine

## 2022-12-20 DIAGNOSIS — S46211A Strain of muscle, fascia and tendon of other parts of biceps, right arm, initial encounter: Secondary | ICD-10-CM | POA: Diagnosis not present

## 2022-12-20 DIAGNOSIS — M25511 Pain in right shoulder: Secondary | ICD-10-CM | POA: Diagnosis not present

## 2022-12-20 DIAGNOSIS — G8929 Other chronic pain: Secondary | ICD-10-CM

## 2022-12-20 DIAGNOSIS — M25611 Stiffness of right shoulder, not elsewhere classified: Secondary | ICD-10-CM | POA: Insufficient documentation

## 2022-12-20 DIAGNOSIS — M6281 Muscle weakness (generalized): Secondary | ICD-10-CM | POA: Insufficient documentation

## 2022-12-20 DIAGNOSIS — R252 Cramp and spasm: Secondary | ICD-10-CM | POA: Diagnosis not present

## 2022-12-20 NOTE — Telephone Encounter (Signed)
Pt dropped off paperwork to be completed by PCP.Pt wants to pick up at ppt on Thursday.

## 2022-12-20 NOTE — Therapy (Signed)
OUTPATIENT PHYSICAL THERAPY TREATMENT       Patient Name: Faith Massey MRN: [id number] DOB:[birth year], 77 y.o., female Today's Date: 12/20/2022  END OF SESSION:  PT End of Session - 12/20/22 1240     Visit Number 10    Date for PT Re-Evaluation 01/23/23    Authorization Type [employer] MCR    Progress Note Due on Visit 17   Recert on visit #7 (11/28/22)   PT Start Time 1148    PT Stop Time 1232    PT Time Calculation (min) 44 min    Activity Tolerance Patient tolerated treatment well    Behavior During Therapy WFL for tasks assessed/performed                     Past Medical History:  Diagnosis Date   Breast CA (HCC)    History of radiation therapy    Chest 06/09/2022 - 07/21/2022 - Dr. 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   Myasthenic syndrome (HCC) 04/12/2022   CVA (cerebral vascular accident) (HCC) 03/28/2022   HTN (hypertension) 03/28/2022   Chest mass 03/28/2022   Right knee injury 02/14/2017   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 10/16/2012   Osteoporosis 10/12/2012    PCP: Pearline Cables, MD  REFERRING PROVIDER: Cristie Hem, PA-C  REFERRING DIAG: [id number] (ICD-10-CM) - Chronic right shoulder pain  THERAPY DIAG:  Chronic right shoulder pain  Rupture of right proximal biceps tendon, initial encounter  RATIONALE FOR EVALUATION AND TREATMENT: Rehabilitation  ONSET DATE: 06/20/22  NEXT MD  VISIT: pending reschedule   SUBJECTIVE:                                                                                                                                                                                      SUBJECTIVE STATEMENT: Pt reports she needs to have surgery for fistula in her colon not scheduled yet.  EVAL:  I had so much pain last night I had to take a Tramadol. I can't get a blouse out of the closet. Hurts to  reach out. Can't reach behind head and can't lift arm without support of other arm. A little better since injection last week. Just recently went off prednisone which she had for IVIG therapy. Diagnosed with Myasthenia gravis in January of this year (associated with the mass). 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 Tore biceps when she was changing the filter on her water filtering system, heard pop. She uses cane intermittently due to balance issues from Myasthenia gravis and prednisone.  Hand dominance: Right  PAIN:  Are you having pain? No  PERTINENT HISTORY: MG, h/o radiation (March and May 2024) for mediastinal mass, HTN, OP  PRECAUTIONS: None  RED FLAGS: None   WEIGHT BEARING RESTRICTIONS: No  FALLS:  Has patient fallen in last 6 months? No  LIVING ENVIRONMENT: Lives with: lives alone Lives in: House/apartment Stairs: No Has following equipment at home: Single point cane  OCCUPATION: retired  PLOF: Independent with household mobility with device  PATIENT GOALS:full function of her R arm   OBJECTIVE:   DIAGNOSTIC FINDINGS:  [place]: Limited musculoskeletal ultrasound of the right upper extremity, right  shoulder was performed.  Short axis evaluation of the bicipital groove  shows no cortical irregularity of the humeral head or groove of the  shoulder.  Within the bicipital groove there is a hypoechoic space with  absent visualization of the proximal head of the biceps tendon, indicative  of likely proximal bicep tendon tear.   Short and long axis evaluation of  the biceps tendon does show retraction into the proximal aspect of the  bicep musculature.  Evaluation of the [facility] joint shows at least moderate  arthritic changes without significant bursal distention.   PATIENT SURVEYS:  Quick Dash 68.2 / 100 = 68.2 % ; 11/28/22 - 63.6 / 100 = 63.6 %  COGNITION: Overall cognitive status: Within functional limits for tasks assessed     SENSATION: WFL  POSTURE: R shoulder anterior, depressed R shoulder, scapula and R ilia  UPPER EXTREMITY ROM:   A/PROM Right eval R 11/28/22 L 11/28/22  Shoulder flexion 20/157 passively with clunk A: 33 with excessive substitution  P: 132 150  Shoulder extension WNL 48 68  Shoulder abduction 58/177 A: 59 P: 160 155  Shoulder adduction     Shoulder internal rotation 55/62 A: 75 P: 65 85  Shoulder external rotation 65/85 very uncomfortable A: 61 P: 85 90  Elbow flexion full    Elbow extension full    (Blank rows = not tested)  UPPER EXTREMITY MMT:  MMT Right eval R 11/28/22 L 11/28/22  Shoulder flexion 2- 2- 5  Shoulder extension 4+ 4 4  Shoulder abduction 4-* with elbow bent 2- 4+  Shoulder adduction     Shoulder internal rotation 4- 4- 4+  Shoulder external rotation 5 4- 4  Middle trapezius     Lower trapezius     Elbow flexion 4* 4 5  Elbow extension 5 mild pain  4 4+  Wrist pronation     Wrist supination     Grip strength (lbs) 10 6 11   (Blank rows = not tested)   PALPATION:  Marked UT, pecs, ant shoulder, IS  Decreased stability in ant R shoulder joint    TODAY'S TREATMENT:  DATE:  12/20/22 THERAPEUTIC EXERCISE: to improve flexibility, strength and mobility.  Demonstration, verbal and tactile cues throughout for technique. Pulleys - R shoulder flexion & scaption x 3 min each, within pain-free ROM Supine isometric R shld ER/IR  5x5" S/L ER R shld x 10  Standing shld ext YTB x 20  Isometric step out ER YTB 10x5" Mid row YTB x 10 - cuing to avoid shoulder shrug  12/14/22 THERAPEUTIC EXERCISE: to improve flexibility, strength and mobility.  Demonstration, verbal and tactile cues throughout for technique. Pulleys - R shoulder flexion & scaption x 3 min each, within pain-free ROM R shoulder flexion finger ladder x 5 - pt to try "walking" up wall rather that "sliding" at home Seated Swiffer R shoulder flexion and scaption 2 x 10 each Seated R shoulder protraction/retraction with UE supported on Swiffer handle at ~90 flexion Supine B wand protraction/retraction x 5 - discontinued due to increased pain/poor control Supine wand R shoulder AAROM x 5 with wand/cane held horizontal with slight flexion at elbows, x 5 with wand held vertical - pt thinks vertical was a little better but instructions provided for both options at home   12/07/22 THERAPEUTIC EXERCISE: to improve flexibility, strength and mobility.  Demonstration, verbal and tactile cues throughout for technique. Pulleys - R shoulder flexion & scaption x 3 min each, within pain-free ROM Standing rows RTB x 10  Standing shoulder extension RTB x 10  Isometric ER step out with YTB 2x5 - challenging; IR with YTB x 5  Standing scapular retraction + ER back to doorframe  Gentle PROM to R shoulder all directions   11/28/22 - Recert/PN THERAPEUTIC EXERCISE: to improve flexibility, strength and mobility.  Demonstration, verbal and tactile cues throughout for technique. Pulleys - R shoulder flexion & scaption x 3 min each, within pain-free ROM Seated shoulder rolls x 20 Seated scapular retraction 2 x 10  THERAPEUTIC ACTIVITIES: QuickDASH: 63.6 / 100 = 63.6 % UE ROM & MMT Goal assessment  MANUAL THERAPY: To promote normalized muscle tension, improved flexibility, improved joint mobility, increased ROM, and reduced pain.  Gentle R shoulder GH distraction with slight  oscillation to reduce pain and muscle guarding Gentle R shoulder PROM in all planes   PATIENT EDUCATION: Education details: HEP update - wand and Swiffer R shoulder AAROM Person educated: Patient Education method: Explanation, Demonstration, Tactile cues, Verbal cues, and Handouts Education comprehension: verbalized understanding, returned demonstration, verbal cues required, tactile cues required, and needs further education  HOME EXERCISE PROGRAM: Access Code: [email or web address] URL: [email or web address] Date: 12/14/2022 Prepared by: Glenetta Hew  Exercises - Seated Bilateral Shoulder Flexion Towel Slide at Table Top  - 1 x daily - 7 x weekly - 1 sets - 10 reps - Seated Shoulder Towel Slides Scaption at Table  - 1 x daily - 7 x weekly - 1 sets - 10 reps - Seated Scapular Retraction  - 1 x daily - 7 x weekly - 1-3 sets - 10 reps - 2-3 sec hold - Seated Scapular Retraction with External Rotation  - 1 x daily - 7 x weekly - 1 sets - 10 reps - Shoulder Flexion Wall Slide with Towel  - 1 x daily - 7 x weekly - 2 sets - 5 reps - Isometric Shoulder Flexion at Wall  - 1 x daily - 7 x weekly - 1 sets - 5 reps - 10 sec hold - Standing Isometric Shoulder Internal Rotation at Doorway  - 1 x daily - 7 x weekly -  1 sets - 5 reps - 10 sec hold - Standing Isometric Shoulder External Rotation with Doorway (Mirrored)  - 1 x daily - 7 x weekly - 1 sets - 5 reps - 10 sec  hold - Standing Bilateral Low Shoulder Row with Anchored Resistance  - 1 x daily - 7 x weekly - 2 sets - 10 reps - 5 sec hold - Scapular Retraction with Resistance Advanced  - 1 x daily - 7 x weekly - 2 sets - 10 reps - 5 sec hold - Shoulder external rotation  - 1 x daily - 3 x weekly - 2 sets - 5 reps - Supine Shoulder Flexion Overhead with Dowel (Mirrored)  - 1 x daily - 7 x weekly - 2 sets - 5 reps - 3 sec hold - Supine Shoulder Flexion with Dowel to 90  - 1 x daily - 7 x weekly - 2 sets - 5 reps - 3 sec  hold   ASSESSMENT:  CLINICAL IMPRESSION: Florentina continues to have pain and difficulty with functional use of R shoulder. Continues to show R scapular dyskinesia with movement, much cuing required throughout session for proper scapular mechanics. Cuing needed with ER in S/L to keep arm in neutral. Amariss will benefit from continued skilled PT to address ongoing deficits to improve mobility and activity tolerance with decreased pain interference.   OBJECTIVE IMPAIRMENTS: decreased ROM, decreased strength, increased muscle spasms, impaired flexibility, impaired UE functional use, postural dysfunction, and pain.   ACTIVITY LIMITATIONS: carrying, lifting, sleeping, bathing, toileting, dressing, self feeding, reach over head, and hygiene/grooming  PARTICIPATION LIMITATIONS: meal prep, cleaning, laundry, driving, and yard work  PERSONAL FACTORS: Time since onset of injury/illness/exacerbation and 3+ comorbidities: Myasthenia gravis, h/o radiation for mediastinal mass, HTN, OP  are also affecting patient's functional outcome.   REHAB POTENTIAL: Good  CLINICAL DECISION MAKING: Evolving/moderate complexity  EVALUATION COMPLEXITY: Moderate   GOALS: Goals reviewed with patient? Yes  SHORT TERM GOALS: Target date: 11/07/2022, extended to 12/26/2022  Patient will be independent with initial HEP.  Baseline:  Goal status: MET - 10/26/22  2.  Patient able to sleep without waking from shoulder pain  Baseline:  Goal status: MET - 12/14/22   LONG TERM GOALS: Target date: 12/05/2022, extended to 01/23/2023  Patient will be independent with advanced/ongoing HEP to improve outcomes and carryover.  Baseline:  Goal status: IN PROGRESS - 11/28/22 - met for current HEP  2.  Patient will report 75% improvement in R shoulder pain to improve QOL.  Baseline:  Goal status: IN PROGRESS - 11/28/22 - pain increased since recent fall  3. Improved R grip strength by 5-10#  Baseline: 10# Goal status: IN PROGRESS - 11/28/22  - 6#  4.  Patient to improve R shoulder AROM to [facility] without pain provocation to allow for increased ease of ADLs.  Baseline:  Goal status: IN PROGRESS - 11/28/22 - R shoulder AROM still significantly limited in all planes  5.  Patient will demonstrate improved functional R UE strength as demonstrated her ability to use her arm to perform grooming and hygiene. Baseline:  Goal status: IN PROGRESS - 11/28/22 - Pt remains unable to use R UE functionally with daily ADLs  6  Patient will report 58/100 on Quick Dash to demonstrate improved functional ability.  Baseline: 68.2 / 100 = 68.2 % Goal status: IN PROGRESS  - 11/28/22 - 63.6 / 100 = 63.6 %   PLAN:  PT FREQUENCY: 2x/week  PT DURATION: 8 weeks  PLANNED INTERVENTIONS: Therapeutic exercises, Therapeutic activity, Neuromuscular re-education, Patient/Family education, Self Care, Joint mobilization, Aquatic Therapy, Dry Needling, Electrical stimulation, Spinal mobilization, Cryotherapy, Moist heat, Taping, Ionotophoresis 4mg /ml Dexamethasone, and Manual therapy  PLAN FOR NEXT SESSION: Shoulder/RC strength and shoulder AAROM; Scapular stabilization!; Review and update HEP as needed   Darleene Cleaver, PTA  12/20/2022, 12:40 PM

## 2022-12-21 ENCOUNTER — Encounter: Payer: Self-pay | Admitting: Family Medicine

## 2022-12-21 DIAGNOSIS — R0609 Other forms of dyspnea: Secondary | ICD-10-CM

## 2022-12-21 DIAGNOSIS — Z86711 Personal history of pulmonary embolism: Secondary | ICD-10-CM

## 2022-12-21 NOTE — Telephone Encounter (Signed)
Form has been filled out placed up front.

## 2022-12-22 DIAGNOSIS — Z86711 Personal history of pulmonary embolism: Secondary | ICD-10-CM | POA: Insufficient documentation

## 2022-12-22 HISTORY — DX: Personal history of pulmonary embolism: Z86.711

## 2022-12-22 MED ORDER — CEPHALEXIN 500 MG PO CAPS: 500.0000 mg | ORAL_CAPSULE | Freq: Two times a day (BID) | ORAL | 0 refills | Status: DC

## 2022-12-22 MED ORDER — APIXABAN 2.5 MG PO TABS: 2.5000 mg | ORAL_TABLET | Freq: Two times a day (BID) | ORAL | 2 refills | Status: DC

## 2022-12-22 NOTE — Addendum Note (Signed)
Addended by: Abbe Amsterdam C on: 12/22/2022 12:26 PM   Modules accepted: Orders

## 2022-12-23 ENCOUNTER — Encounter: Payer: Self-pay | Admitting: Physical Therapy

## 2022-12-23 ENCOUNTER — Ambulatory Visit: Payer: Medicare HMO | Admitting: Physical Therapy

## 2022-12-23 DIAGNOSIS — S46211A Strain of muscle, fascia and tendon of other parts of biceps, right arm, initial encounter: Secondary | ICD-10-CM | POA: Diagnosis not present

## 2022-12-23 DIAGNOSIS — M25611 Stiffness of right shoulder, not elsewhere classified: Secondary | ICD-10-CM

## 2022-12-23 DIAGNOSIS — M25511 Pain in right shoulder: Secondary | ICD-10-CM | POA: Diagnosis not present

## 2022-12-23 DIAGNOSIS — M6281 Muscle weakness (generalized): Secondary | ICD-10-CM | POA: Diagnosis not present

## 2022-12-23 DIAGNOSIS — R252 Cramp and spasm: Secondary | ICD-10-CM

## 2022-12-23 DIAGNOSIS — G8929 Other chronic pain: Secondary | ICD-10-CM | POA: Diagnosis not present

## 2022-12-23 NOTE — Addendum Note (Signed)
Addended by: Abbe Amsterdam C on: 12/23/2022 03:02 PM   Modules accepted: Orders

## 2022-12-23 NOTE — Therapy (Addendum)
OUTPATIENT PHYSICAL THERAPY TREATMENT       Patient Name: Amazin Lasswell MRN: [id number] DOB:[birth year], 77 y.o., female Today's Date: 12/23/2022  END OF SESSION:  PT End of Session - 12/23/22 1102     Visit Number 11    Date for PT Re-Evaluation 01/23/23    Authorization Type [employer] MCR    Progress Note Due on Visit 17   Recert on visit #7 (11/28/22)   PT Start Time 1102    PT Stop Time 1154    PT Time Calculation (min) 52 min    Activity Tolerance Patient tolerated treatment well    Behavior During Therapy WFL for tasks assessed/performed                      Past Medical History:  Diagnosis Date   Breast CA (HCC)    History of radiation therapy    Chest 06/09/2022 - 07/21/2022 - Dr. 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   Myasthenic syndrome (HCC) 04/12/2022   CVA (cerebral vascular accident) (HCC) 03/28/2022   HTN (hypertension) 03/28/2022   Chest mass 03/28/2022   Right knee injury 02/14/2017   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 10/16/2012   Osteoporosis 10/12/2012    PCP: Pearline Cables, MD  REFERRING PROVIDER: Cristie Hem, PA-C  REFERRING DIAG: [phone] (ICD-10-CM) - Chronic right shoulder pain  THERAPY DIAG:  Acute pain of right shoulder  Stiffness of right shoulder, not elsewhere classified  Muscle weakness (generalized)  Cramp and  spasm  RATIONALE FOR EVALUATION AND TREATMENT: Rehabilitation  ONSET DATE: 06/20/22  NEXT MD VISIT: TBD   SUBJECTIVE:                                                                                                                                                                                      SUBJECTIVE STATEMENT: Pt reports minimal to no pain most of the time but still will have occasional "catching" pain with some motions.  EVAL:  I had so much  pain last night I had to take a Tramadol. I can't get a blouse out of the closet. Hurts to reach out. Can't reach behind head and can't lift arm without support of other arm. A little better since injection last week. Just recently went off prednisone which she had for IVIG therapy. Diagnosed with Myasthenia gravis in January of this year (associated with the mass). 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 Tore biceps when she was changing the filter on her water filtering system, heard pop. She uses cane intermittently due to balance issues from Myasthenia gravis and prednisone.  Hand dominance: Right  PAIN:  Are you having pain? No  PERTINENT HISTORY: MG, h/o radiation (March and May 2024) for mediastinal mass, HTN, OP  PRECAUTIONS: None  RED FLAGS: None   WEIGHT BEARING RESTRICTIONS: No  FALLS:  Has patient fallen in last 6 months? No  LIVING ENVIRONMENT: Lives with: lives alone Lives in: House/apartment Stairs: No Has following equipment at home: Single point cane  OCCUPATION: retired  PLOF: Independent with household mobility with device  PATIENT GOALS:full function of her R arm   OBJECTIVE:   DIAGNOSTIC FINDINGS:  [place]: Limited musculoskeletal ultrasound of the right upper extremity, right  shoulder was performed.  Short axis evaluation of the bicipital groove  shows no cortical irregularity of the humeral head or groove of the  shoulder.  Within the bicipital groove there is a hypoechoic space with  absent  visualization of the proximal head of the biceps tendon, indicative  of likely proximal bicep tendon tear.  Short and long axis evaluation of  the biceps tendon does show retraction into the proximal aspect of the  bicep musculature.  Evaluation of the [facility] joint shows at least moderate  arthritic changes without significant bursal distention.   PATIENT SURVEYS:  Quick Dash 68.2 / 100 = 68.2 % ; 11/28/22 - 63.6 / 100 = 63.6 %  COGNITION: Overall cognitive status: Within functional limits for tasks assessed     SENSATION: WFL  POSTURE: R shoulder anterior, depressed R shoulder, scapula and R ilia  UPPER EXTREMITY ROM:   A/PROM Right eval R 11/28/22 L 11/28/22  Shoulder flexion 20/157 passively with clunk A: 33 with excessive substitution  P: 132 150  Shoulder extension WNL 48 68  Shoulder abduction 58/177 A: 59 P: 160 155  Shoulder adduction     Shoulder internal rotation 55/62 A: 75 P: 65 85  Shoulder external rotation 65/85 very uncomfortable A: 61 P: 85 90  Elbow flexion full    Elbow extension full    (Blank rows = not tested)  UPPER EXTREMITY MMT:  MMT Right eval R 11/28/22 L 11/28/22  Shoulder flexion 2- 2- 5  Shoulder extension 4+ 4 4  Shoulder abduction 4-* with elbow bent 2- 4+  Shoulder adduction     Shoulder internal rotation 4- 4- 4+  Shoulder external rotation 5 4- 4  Middle trapezius     Lower trapezius     Elbow flexion 4* 4 5  Elbow extension 5 mild pain  4 4+  Wrist pronation     Wrist supination     Grip strength (lbs) 10 6 11   (Blank rows = not tested)   PALPATION:  Marked UT, pecs, ant shoulder, IS  Decreased stability in ant R shoulder joint    TODAY'S TREATMENT:  DATE:   12/22/22 THERAPEUTIC EXERCISE: to improve flexibility, strength and mobility.  Demonstration, verbal and tactile cues throughout for  technique. Pulleys - R shoulder flexion & scaption x 3 min each, within pain-free ROM Seated Swiffer R shoulder flexion and scaption 2 x 10 each Supine wand R shoulder flexion AAROM x 5 with wand/cane held horizontal with slight flexion at elbows, x 10 with wand held vertical - pt preferring vertical hold but cues still necessary to avoid excessive horiz ADD, keeping flexion pattern in neutral alignment with shoulder (removed horiz wand AAROM from HEP) S/L R scap retraction + shoulder ER x 10  Bent over B scap retraction & shoulder row 2 x 10 Bent over B scap retraction & shoulder extension 2 x 10 Standing RTB scap retraction + shoulder row x 10 Standing RTB scap retraction + shoulder extension x 10 - pt may need to continue with YTB at home R shoulder YTB isometric IR/ER step-out x 2 - discontinued due to poor control   12/20/22 THERAPEUTIC EXERCISE: to improve flexibility, strength and mobility.  Demonstration, verbal and tactile cues throughout for technique. Pulleys - R shoulder flexion & scaption x 3 min each, within pain-free ROM Supine isometric R shld ER/IR 5x5" S/L ER R shld x 10  Standing shld ext YTB x 20  Isometric step out ER YTB 10x5" Mid row YTB x 10 - cuing to avoid shoulder shrug   12/14/22 THERAPEUTIC EXERCISE: to improve flexibility, strength and mobility.  Demonstration, verbal and tactile cues throughout for technique. Pulleys - R shoulder flexion & scaption x 3 min each, within pain-free ROM R shoulder flexion finger ladder x 5 - pt to try "walking" up wall rather that "sliding" at home Seated Swiffer R shoulder flexion and scaption 2 x 10 each Seated R shoulder protraction/retraction with UE supported on Swiffer handle at ~90 flexion Supine B wand protraction/retraction x 5 - discontinued due to increased pain/poor control Supine wand R shoulder AAROM x 5 with wand/cane held horizontal with slight flexion at elbows, x 5 with wand held vertical - pt thinks vertical  was a little better but instructions provided for both options at home   PATIENT EDUCATION: Education details: HEP update - review of Swiffer R shoulder AAROM & prone seated rows/extension Person educated: Patient Education method: Explanation, Demonstration, Tactile cues, Verbal cues, Handouts, and MedBridgeGO access information provided to pt Education comprehension: verbalized understanding, returned demonstration, verbal cues required, tactile cues required, and needs further education  HOME EXERCISE PROGRAM: Access Code: [email or web address] URL: [email or web address] Date: 12/23/2022 Prepared by: Glenetta Hew  Exercises - Seated Bilateral Shoulder Flexion Towel Slide at Table Top  - 1 x daily - 7 x weekly - 1 sets - 10 reps - Seated Shoulder Towel Slides Scaption at Table  - 1 x daily - 7 x weekly - 1 sets - 10 reps - Seated Scapular Retraction  - 1 x daily - 7 x weekly - 1-3 sets - 10 reps - 2-3 sec hold - Seated Scapular Retraction with External Rotation  - 1 x daily - 7 x weekly - 1 sets - 10 reps - Shoulder Flexion Wall Slide with Towel  - 1 x daily - 7 x weekly - 2 sets - 5 reps - Isometric Shoulder Flexion at Wall  - 1 x daily - 7 x weekly - 1 sets - 5 reps - 10 sec hold - Standing Isometric Shoulder Internal Rotation at Doorway  - 1 x daily - 7 x weekly -  1 sets - 5 reps - 10 sec hold - Standing Isometric Shoulder External Rotation with Doorway (Mirrored)  - 1 x daily - 7 x weekly - 1 sets - 5 reps - 10 sec  hold - Standing Bilateral Low Shoulder Row with Anchored Resistance  - 1 x daily - 7 x weekly - 2 sets - 10 reps - 5 sec hold - Scapular Retraction with Resistance Advanced  - 1 x daily - 7 x weekly - 2 sets - 10 reps - 5 sec hold - Shoulder external rotation  - 1 x daily - 3 x weekly - 2 sets - 5 reps - Supine Shoulder Flexion Overhead with Dowel (Mirrored)  - 1 x daily - 7 x weekly - 2 sets - 5 reps - 3 sec hold - Seated Bent Over Shoulder Row with Dumbbells  - 1 x  daily - 7 x weekly - 2 sets - 10 reps - 3 sec hold - Seated Shoulder Extension  - 1 x daily - 7 x weekly - 2 sets - 10 reps - 3 sec hold   ASSESSMENT:  CLINICAL IMPRESSION: Maritess is demonstrating improving R shoulder AAROM but continues to struggle with active control of R shoulder resulting in limited functional use of R UE. Significant substitution/shoulder hiking evident with poor scapular stabilization/coordination with GH movement, therefore continued focus on scapular activation/awareness, adding seated bent over scapular exercises to HEP.  Wand and Swiffer exercises reviewed at pt request with wand better tolerated in vertical hold but cues necessary to avoid bringing R arm towards midline of body.  Julicia will benefit from continued skilled PT to address ongoing deficits to improve mobility and activity tolerance with decreased pain interference.   OBJECTIVE IMPAIRMENTS: decreased ROM, decreased strength, increased muscle spasms, impaired flexibility, impaired UE functional use, postural dysfunction, and pain.   ACTIVITY LIMITATIONS: carrying, lifting, sleeping, bathing, toileting, dressing, self feeding, reach over head, and hygiene/grooming  PARTICIPATION LIMITATIONS: meal prep, cleaning, laundry, driving, and yard work  PERSONAL FACTORS: Time since onset of injury/illness/exacerbation and 3+ comorbidities: Myasthenia gravis, h/o radiation for mediastinal mass, HTN, OP  are also affecting patient's functional outcome.   REHAB POTENTIAL: Good  CLINICAL DECISION MAKING: Evolving/moderate complexity  EVALUATION COMPLEXITY: Moderate   GOALS: Goals reviewed with patient? Yes  SHORT TERM GOALS: Target date: 11/07/2022, extended to 12/26/2022  Patient will be independent with initial HEP.  Baseline:  Goal status: MET - 10/26/22  2.  Patient able to sleep without waking from shoulder pain  Baseline:  Goal status: MET - 12/14/22   LONG TERM GOALS: Target date: 12/05/2022, extended to  01/23/2023  Patient will be independent with advanced/ongoing HEP to improve outcomes and carryover.  Baseline:  Goal status: IN PROGRESS - 12/23/22 - met for current HEP  2.  Patient will report 75% improvement in R shoulder pain to improve QOL.  Baseline:  Goal status: IN PROGRESS - 12/23/22 - Pt reports minimal to no pain most of the time but still will have occasional "catching" pain with some motions  3. Improved R grip strength by 5-10#  Baseline: 10# Goal status: IN PROGRESS - 11/28/22 - 6#  4.  Patient to improve R shoulder AROM to [facility] without pain provocation to allow for increased ease of ADLs.  Baseline:  Goal status: IN PROGRESS - 11/28/22 - R shoulder AROM still significantly limited in all planes  5.  Patient will demonstrate improved functional R UE strength as demonstrated her ability to use her  arm to perform grooming and hygiene. Baseline:  Goal status: IN PROGRESS - 12/23/22 - Pt remains unable to use R UE functionally with most daily ADLs  6  Patient will report 58/100 on Quick Dash to demonstrate improved functional ability.  Baseline: 68.2 / 100 = 68.2 % Goal status: IN PROGRESS  - 11/28/22 - 63.6 / 100 = 63.6 %   PLAN:  PT FREQUENCY: 2x/week  PT DURATION: 8 weeks  PLANNED INTERVENTIONS: Therapeutic exercises, Therapeutic activity, Neuromuscular re-education, Patient/Family education, Self Care, Joint mobilization, Aquatic Therapy, Dry Needling, Electrical stimulation, Spinal mobilization, Cryotherapy, Moist heat, Taping, Ionotophoresis 4mg /ml Dexamethasone, and Manual therapy  PLAN FOR NEXT SESSION: Reassess shoulder ROM; Shoulder/RC strength and shoulder AAROM; Scapular stabilization!; Review and update HEP as needed   Marry Guan, PT  12/23/2022, 12:28 PM

## 2022-12-26 ENCOUNTER — Encounter (HOSPITAL_COMMUNITY): Payer: Self-pay | Admitting: Family Medicine

## 2022-12-26 ENCOUNTER — Encounter: Payer: Self-pay | Admitting: Family Medicine

## 2022-12-27 NOTE — Therapy (Addendum)
OUTPATIENT PHYSICAL THERAPY TREATMENT       Patient Name: Faith Massey MRN: [id number] DOB:[birth year], 77 y.o., female Today's Date: 12/28/2022  END OF SESSION:  PT End of Session - 12/28/22 1059     Visit Number 12    Date for PT Re-Evaluation 01/23/23    Authorization Type [employer] MCR    Progress Note Due on Visit 17    PT Start Time 1100    PT Stop Time 1145    PT Time Calculation (min) 45 min    Activity Tolerance Patient tolerated treatment well    Behavior During Therapy WFL for tasks assessed/performed                       Past Medical History:  Diagnosis Date   Breast CA (HCC)    History of radiation therapy    Chest 06/09/2022 - 07/21/2022 - Dr. 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   Myasthenic syndrome (HCC) 04/12/2022   CVA (cerebral vascular accident) (HCC) 03/28/2022   HTN (hypertension) 03/28/2022   Chest mass 03/28/2022   Right knee injury 02/14/2017   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 10/16/2012   Osteoporosis 10/12/2012    PCP: Pearline Cables, MD  REFERRING PROVIDER: Cristie Hem, PA-C  REFERRING DIAG: [phone] (ICD-10-CM) - Chronic right shoulder pain  THERAPY DIAG:  Acute pain of right shoulder  Stiffness of right shoulder, not elsewhere classified  Muscle weakness (generalized)  Cramp and spasm  RATIONALE FOR  EVALUATION AND TREATMENT: Rehabilitation  ONSET DATE: 06/20/22  NEXT MD VISIT: TBD   SUBJECTIVE:                                                                                                                                                                                      SUBJECTIVE STATEMENT: Patient states she is having fistula surgery in November. Sleeping is much better. It still hurts but it's better.   EVAL:  I had so much pain last night I had  to take a Tramadol. I can't get a blouse out of the closet. Hurts to reach out. Can't reach behind head and can't lift arm without support of other arm. A little better since injection last week. Just recently went off prednisone which she had for IVIG therapy. Diagnosed with Myasthenia gravis in January of this year (associated with the mass). 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 Tore biceps when she was changing the filter on her water filtering system, heard pop. She uses cane intermittently due to balance issues from Myasthenia gravis and prednisone.  Hand dominance: Right  PAIN:  Are you having pain? No  PERTINENT HISTORY: MG, h/o radiation (March and May 2024) for mediastinal mass, HTN, OP  PRECAUTIONS: None  RED FLAGS: None   WEIGHT BEARING RESTRICTIONS: No  FALLS:  Has patient fallen in last 6 months? No  LIVING ENVIRONMENT: Lives with: lives alone Lives in: House/apartment Stairs: No Has following equipment at home: Single point cane  OCCUPATION: retired  PLOF: Independent with household mobility with device  PATIENT GOALS:full function of her R arm   OBJECTIVE:   DIAGNOSTIC FINDINGS:  [place]: Limited musculoskeletal ultrasound of the right upper extremity, right  shoulder was performed.  Short axis evaluation of the bicipital groove  shows no cortical irregularity of the humeral head or groove of the  shoulder.  Within the bicipital groove there is a hypoechoic space with  absent visualization of  the proximal head of the biceps tendon, indicative  of likely proximal bicep tendon tear.  Short and long axis evaluation of  the biceps tendon does show retraction into the proximal aspect of the  bicep musculature.  Evaluation of the [facility] joint shows at least moderate  arthritic changes without significant bursal distention.   PATIENT SURVEYS:  Quick Dash 68.2 / 100 = 68.2 % ; 11/28/22 - 63.6 / 100 = 63.6 %  COGNITION: Overall cognitive status: Within functional limits for tasks assessed     SENSATION: WFL  POSTURE: R shoulder anterior, depressed R shoulder, scapula and R ilia  UPPER EXTREMITY ROM:   A/PROM Right eval R 11/28/22 L 11/28/22 R 12/28/22  Shoulder flexion 20/157 passively with clunk A: 33 with excessive substitution  P: 132 150 A: 33 very difficult P: 147   Shoulder extension WNL 48 68   Shoulder abduction 58/177 A: 59 P: 160 155 A: 56* P: full  Shoulder adduction      Shoulder internal rotation 55/62 A: 75 P: 65 85 A: 50 with blocking of humeral head P: 68  Shoulder external rotation 65/85 very uncomfortable A: 61 P: 85 90 A: 55 P: 68*   Elbow flexion full     Elbow extension full     (Blank rows = not tested)  UPPER EXTREMITY MMT:  MMT Right eval R 11/28/22 L 11/28/22  Shoulder flexion 2- 2- 5  Shoulder extension 4+ 4 4  Shoulder abduction 4-* with elbow bent 2- 4+  Shoulder adduction     Shoulder internal rotation 4- 4- 4+  Shoulder external rotation 5 4- 4  Middle trapezius     Lower trapezius     Elbow flexion 4* 4 5  Elbow extension 5 mild pain  4 4+  Wrist pronation     Wrist supination     Grip strength (lbs) 10 6 11   (Blank rows = not tested)   PALPATION:  Marked UT, pecs, ant shoulder, IS  Decreased stability in ant R shoulder joint  TODAY'S TREATMENT:                                                                                                                                         DATE:   12/28/22 THERAPEUTIC EXERCISE: to improve  flexibility, strength and mobility.  Demonstration, verbal and tactile cues throughout for technique. Pulleys - R shoulder flexion & scaption x 3 min each, within pain-free ROM Seated Swiffer R shoulder flexion and scaption 2 x 10 each Bent over B scap retraction & shoulder row 2 x 10 R 1#, L 2# Bent over B scap retraction & shoulder extension 2 x 10 R 1#, L 2#  MANUAL THERAPY: to decrease muscle spasm and pain and improve mobility  ROM measurements taken KT tape for ant support of R shoulder - 2 I strips; one ant deltoid to upper trap applied with post glide of humeral head, one strip mid deltoid to UT   12/22/22 THERAPEUTIC EXERCISE: to improve flexibility, strength and mobility.  Demonstration, verbal and tactile cues throughout for technique. Pulleys - R shoulder flexion & scaption x 3 min each, within pain-free ROM Seated Swiffer R shoulder flexion and scaption 2 x 10 each Supine wand R shoulder flexion AAROM x 5 with wand/cane held horizontal with slight flexion at elbows, x 10 with wand held vertical - pt preferring vertical hold but cues still necessary to avoid excessive horiz ADD, keeping flexion pattern in neutral alignment with shoulder (removed horiz wand AAROM from HEP) S/L R scap retraction + shoulder ER x 10  Bent over B scap retraction & shoulder row 2 x 10 Bent over B scap retraction & shoulder extension 2 x 10 Standing RTB scap retraction + shoulder row x 10 Standing RTB scap retraction + shoulder extension x 10 - pt may need to continue with YTB at home R shoulder YTB isometric IR/ER step-out x 2 - discontinued due to poor control   12/20/22 THERAPEUTIC EXERCISE: to improve flexibility, strength and mobility.  Demonstration, verbal and tactile cues throughout for technique. Pulleys - R shoulder flexion & scaption x 3 min each, within pain-free ROM Supine isometric R shld ER/IR 5x5" S/L ER R shld x 10  Standing shld ext YTB x 20  Isometric step out ER YTB 10x5" Mid row  YTB x 10 - cuing to avoid shoulder shrug   12/14/22 THERAPEUTIC EXERCISE: to improve flexibility, strength and mobility.  Demonstration, verbal and tactile cues throughout for technique. Pulleys - R shoulder flexion & scaption x 3 min each, within pain-free ROM R shoulder flexion finger ladder x 5 - pt to try "walking" up wall rather that "sliding" at home Seated Swiffer R shoulder flexion and scaption 2 x 10 each Seated R shoulder protraction/retraction with UE supported on Swiffer handle at ~90 flexion Supine B wand protraction/retraction x 5 - discontinued due to increased pain/poor control Supine wand R shoulder AAROM x  5 with wand/cane held horizontal with slight flexion at elbows, x 5 with wand held vertical - pt thinks vertical was a little better but instructions provided for both options at home   PATIENT EDUCATION: Education details: Health and safety inspector wearing and removal instructions Person educated: Patient Education method: Explanation, Demonstration, Tactile cues, Verbal cues, Handouts, and MedBridgeGO access information provided to pt Education comprehension: verbalized understanding, returned demonstration, verbal cues required, tactile cues required, and needs further education  HOME EXERCISE PROGRAM: Access Code: [email or web address] URL: [email or web address] Date: 12/23/2022 Prepared by: Glenetta Hew  Exercises - Seated Bilateral Shoulder Flexion Towel Slide at Table Top  - 1 x daily - 7 x weekly - 1 sets - 10 reps - Seated Shoulder Towel Slides Scaption at Table  - 1 x daily - 7 x weekly - 1 sets - 10 reps - Seated Scapular Retraction  - 1 x daily - 7 x weekly - 1-3 sets - 10 reps - 2-3 sec hold - Seated Scapular Retraction with External Rotation  - 1 x daily - 7 x weekly - 1 sets - 10 reps - Shoulder Flexion Wall Slide with Towel  - 1 x daily - 7 x weekly - 2 sets - 5 reps - Isometric Shoulder Flexion at Wall  - 1 x daily - 7 x weekly - 1 sets - 5 reps  - 10 sec hold - Standing Isometric Shoulder Internal Rotation at Doorway  - 1 x daily - 7 x weekly - 1 sets - 5 reps - 10 sec hold - Standing Isometric Shoulder External Rotation with Doorway (Mirrored)  - 1 x daily - 7 x weekly - 1 sets - 5 reps - 10 sec  hold - Standing Bilateral Low Shoulder Row with Anchored Resistance  - 1 x daily - 7 x weekly - 2 sets - 10 reps - 5 sec hold - Scapular Retraction with Resistance Advanced  - 1 x daily - 7 x weekly - 2 sets - 10 reps - 5 sec hold - Shoulder external rotation  - 1 x daily - 3 x weekly - 2 sets - 5 reps - Supine Shoulder Flexion Overhead with Dowel (Mirrored)  - 1 x daily - 7 x weekly - 2 sets - 5 reps - 3 sec hold - Seated Bent Over Shoulder Row with Dumbbells  - 1 x daily - 7 x weekly - 2 sets - 10 reps - 3 sec hold - Seated Shoulder Extension  - 1 x daily - 7 x weekly - 2 sets - 10 reps - 3 sec hold   ASSESSMENT:  CLINICAL IMPRESSION: Anaya continues to demonstrate poor muscular control with elevation of R shoulder and excessive ant GH movement  with extension activities. She reports the pulleys feel good to her. PT discussed with patient how to make her own pulleys and also let her know they are available online for under $10. She may benefit from these to maintain ROM as she has significant difficulty with supine dowel/swiffer movements. She was able to use weight with bent over rows and extension today without complaint other than her shoulder moving anteriorly and some crepitis. Trial of KT tape to support this instability after reviewing purpose, precautions and removal. Her A/ROM has remained the same and in some planes decreased secondary to pain. Overall she feels her shoulder is improving including sleeping tolerance.   OBJECTIVE IMPAIRMENTS: decreased ROM, decreased strength, increased muscle spasms, impaired flexibility, impaired UE functional use, postural dysfunction, and  pain.   ACTIVITY LIMITATIONS: carrying, lifting, sleeping,  bathing, toileting, dressing, self feeding, reach over head, and hygiene/grooming  PARTICIPATION LIMITATIONS: meal prep, cleaning, laundry, driving, and yard work  PERSONAL FACTORS: Time since onset of injury/illness/exacerbation and 3+ comorbidities: Myasthenia gravis, h/o radiation for mediastinal mass, HTN, OP  are also affecting patient's functional outcome.   REHAB POTENTIAL: Good  CLINICAL DECISION MAKING: Evolving/moderate complexity  EVALUATION COMPLEXITY: Moderate   GOALS: Goals reviewed with patient? Yes  SHORT TERM GOALS: Target date: 11/07/2022, extended to 12/26/2022  Patient will be independent with initial HEP.  Baseline:  Goal status: MET - 10/26/22  2.  Patient able to sleep without waking from shoulder pain  Baseline:  Goal status: MET - 12/14/22   LONG TERM GOALS: Target date: 12/05/2022, extended to 01/23/2023  Patient will be independent with advanced/ongoing HEP to improve outcomes and carryover.  Baseline:  Goal status: IN PROGRESS - 12/23/22 - met for current HEP  2.  Patient will report 75% improvement in R shoulder pain to improve QOL.  Baseline:  Goal status: IN PROGRESS - 12/23/22 - Pt reports minimal to no pain most of the time but still will have occasional "catching" pain with some motions  3. Improved R grip strength by 5-10#  Baseline: 10# Goal status: IN PROGRESS - 11/28/22 - 6#  4.  Patient to improve R shoulder AROM to [facility] without pain provocation to allow for increased ease of ADLs.  Baseline:  Goal status: IN PROGRESS - 11/28/22 - R shoulder AROM still significantly limited in all planes  5.  Patient will demonstrate improved functional R UE strength as demonstrated her ability to use her arm to perform grooming and hygiene. Baseline:  Goal status: IN PROGRESS - 12/23/22 - Pt remains unable to use R UE functionally with most daily ADLs  6  Patient will report 58/100 on Quick Dash to demonstrate improved functional ability.  Baseline: 68.2 / 100  = 68.2 % Goal status: IN PROGRESS  - 11/28/22 - 63.6 / 100 = 63.6 %   PLAN:  PT FREQUENCY: 2x/week  PT DURATION: 8 weeks  PLANNED INTERVENTIONS: Therapeutic exercises, Therapeutic activity, Neuromuscular re-education, Patient/Family education, Self Care, Joint mobilization, Aquatic Therapy, Dry Needling, Electrical stimulation, Spinal mobilization, Cryotherapy, Moist heat, Taping, Ionotophoresis 4mg /ml Dexamethasone, and Manual therapy  PLAN FOR NEXT SESSION: Assess response to KT tape; Shoulder/RC strength and shoulder AAROM; Scapular stabilization!; Review and update HEP as needed   Solon Palm, PT   12/28/2022, 12:03 PM

## 2022-12-28 ENCOUNTER — Inpatient Hospital Stay: Payer: Medicare HMO | Attending: Hematology & Oncology

## 2022-12-28 ENCOUNTER — Encounter: Payer: Self-pay | Admitting: Hematology & Oncology

## 2022-12-28 ENCOUNTER — Encounter: Payer: Self-pay | Admitting: Physical Therapy

## 2022-12-28 ENCOUNTER — Inpatient Hospital Stay (HOSPITAL_BASED_OUTPATIENT_CLINIC_OR_DEPARTMENT_OTHER): Payer: Medicare HMO | Admitting: Hematology & Oncology

## 2022-12-28 ENCOUNTER — Other Ambulatory Visit: Payer: Self-pay | Admitting: Urology

## 2022-12-28 ENCOUNTER — Ambulatory Visit: Payer: Medicare HMO | Admitting: Physical Therapy

## 2022-12-28 VITALS — BP 129/57 | HR 101 | Temp 98.0°F | Resp 20 | Ht <= 58 in | Wt 110.0 lb

## 2022-12-28 DIAGNOSIS — I2699 Other pulmonary embolism without acute cor pulmonale: Secondary | ICD-10-CM | POA: Insufficient documentation

## 2022-12-28 DIAGNOSIS — C37 Malignant neoplasm of thymus: Secondary | ICD-10-CM | POA: Diagnosis not present

## 2022-12-28 DIAGNOSIS — R252 Cramp and spasm: Secondary | ICD-10-CM

## 2022-12-28 DIAGNOSIS — G8929 Other chronic pain: Secondary | ICD-10-CM

## 2022-12-28 DIAGNOSIS — Z9013 Acquired absence of bilateral breasts and nipples: Secondary | ICD-10-CM | POA: Insufficient documentation

## 2022-12-28 DIAGNOSIS — M8000XA Age-related osteoporosis with current pathological fracture, unspecified site, initial encounter for fracture: Secondary | ICD-10-CM

## 2022-12-28 DIAGNOSIS — N321 Vesicointestinal fistula: Secondary | ICD-10-CM | POA: Diagnosis not present

## 2022-12-28 DIAGNOSIS — M25511 Pain in right shoulder: Secondary | ICD-10-CM | POA: Diagnosis not present

## 2022-12-28 DIAGNOSIS — M25611 Stiffness of right shoulder, not elsewhere classified: Secondary | ICD-10-CM

## 2022-12-28 DIAGNOSIS — S46211A Strain of muscle, fascia and tendon of other parts of biceps, right arm, initial encounter: Secondary | ICD-10-CM

## 2022-12-28 DIAGNOSIS — M6281 Muscle weakness (generalized): Secondary | ICD-10-CM | POA: Diagnosis not present

## 2022-12-28 DIAGNOSIS — I10 Essential (primary) hypertension: Secondary | ICD-10-CM

## 2022-12-28 DIAGNOSIS — Z7901 Long term (current) use of anticoagulants: Secondary | ICD-10-CM | POA: Insufficient documentation

## 2022-12-28 LAB — CBC WITH DIFFERENTIAL (CANCER CENTER ONLY)
Abs Immature Granulocytes: 0.02 10*3/uL (ref 0.00–0.07)
Basophils Absolute: 0 10*3/uL (ref 0.0–0.1)
Basophils Relative: 1 %
Eosinophils Absolute: 0.2 10*3/uL (ref 0.0–0.5)
Eosinophils Relative: 2 %
HCT: 34.9 % — ABNORMAL LOW (ref 36.0–46.0)
Hemoglobin: 11.4 g/dL — ABNORMAL LOW (ref 12.0–15.0)
Immature Granulocytes: 0 %
Lymphocytes Relative: 14 %
Lymphs Abs: 1.2 10*3/uL (ref 0.7–4.0)
MCH: 32.2 pg (ref 26.0–34.0)
MCHC: 32.7 g/dL (ref 30.0–36.0)
MCV: 98.6 fL (ref 80.0–100.0)
Monocytes Absolute: 0.8 10*3/uL (ref 0.1–1.0)
Monocytes Relative: 10 %
Neutro Abs: 6.1 10*3/uL (ref 1.7–7.7)
Neutrophils Relative %: 73 %
Platelet Count: 271 10*3/uL (ref 150–400)
RBC: 3.54 MIL/uL — ABNORMAL LOW (ref 3.87–5.11)
RDW: 16.1 % — ABNORMAL HIGH (ref 11.5–15.5)
WBC Count: 8.3 10*3/uL (ref 4.0–10.5)
nRBC: 0 % (ref 0.0–0.2)

## 2022-12-28 LAB — CMP (CANCER CENTER ONLY)
ALT: 24 U/L (ref 0–44)
AST: 22 U/L (ref 15–41)
Albumin: 3.8 g/dL (ref 3.5–5.0)
Alkaline Phosphatase: 54 U/L (ref 38–126)
Anion gap: 9 (ref 5–15)
BUN: 24 mg/dL — ABNORMAL HIGH (ref 8–23)
CO2: 27 mmol/L (ref 22–32)
Calcium: 9.8 mg/dL (ref 8.9–10.3)
Chloride: 109 mmol/L (ref 98–111)
Creatinine: 1.08 mg/dL — ABNORMAL HIGH (ref 0.44–1.00)
GFR, Estimated: 54 mL/min — ABNORMAL LOW (ref >60)
Glucose, Bld: 127 mg/dL — ABNORMAL HIGH (ref 70–99)
Potassium: 4.4 mmol/L (ref 3.5–5.1)
Sodium: 145 mmol/L (ref 135–145)
Total Bilirubin: 0.5 mg/dL (ref 0.3–1.2)
Total Protein: 7 g/dL (ref 6.5–8.1)

## 2022-12-28 LAB — LACTATE DEHYDROGENASE: LDH: 166 U/L (ref 98–192)

## 2022-12-28 LAB — VITAMIN B12: Vitamin B-12: 259 pg/mL (ref 180–914)

## 2022-12-28 LAB — FERRITIN: Ferritin: 62 ng/mL (ref 11–307)

## 2022-12-28 NOTE — Progress Notes (Signed)
Hematology and Oncology Follow Up Visit  Faith Massey [id number] [birth year] 77 y.o. 12/28/2022  Past Medical History:  Diagnosis Date   Breast CA [facility])    History of radiation therapy    Chest 06/09/2022 - 07/21/2022 - Dr. 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  It looks like she is going to have surgery for the fistula repair on November 20.  I will have to make sure that we leave a message so that I see her when she is in the hospital.  She had a CT scan on 11/10/2022.  This showed that she had right-sided pyelonephritis and ureteritis.  She had the fistula.  She has some diverticulosis.  She is doing okay.  She really has had no specific complaints.  She has had no bleeding.  She has had no nausea or vomiting.  She continues on Eliquis for the pulmonary embolism.  We can certainly help manage the Eliquis for her during the perioperative period.  Overall, I would say that her performance status is probably ECOG 1.    Wt Readings from Last 3 Encounters:  12/28/22 110 lb 0.6 oz (49.9 kg)  11/29/22 111 lb (50.3 kg)  11/23/22 115 lb (52.2 kg)     Medications:   Current Outpatient Medications:    apixaban (ELIQUIS) 2.5 MG TABS tablet, Take 1 tablet (2.5 mg total) by mouth 2 (two) times daily., Disp: 180 tablet, Rfl: 2   aspirin EC 81 MG tablet, Take 1 tablet (81 mg total) by mouth daily. Swallow whole., Disp: 30 tablet, Rfl: 12   cephALEXin (KEFLEX) 500 MG capsule, Take 1 capsule (500 mg total) by mouth 2 (two) times daily., Disp: 60 capsule, Rfl: 0   Cholecalciferol (VITAMIN D-3) 5000 UNITS TABS, Take 5,000 Units by mouth  daily., Disp: , Rfl:    clotrimazole (MYCELEX) 10 MG troche, Take 1 tablet (10 mg total) by mouth 5 (five) times daily., Disp: 35 Troche, Rfl: 1   COLLAGEN PO, Take 1 Scoop by mouth daily., Disp: , Rfl:    ezetimibe (ZETIA) 10 MG tablet, Take 1 tablet (10 mg total) by mouth daily., Disp: 30 tablet, Rfl: 3   lactobacillus acidophilus (BACID) TABS tablet, Take 1 tablet by mouth daily., Disp: , Rfl:    lisinopril (ZESTRIL) 40 MG tablet, Take 1 tablet (40 mg total) by mouth daily., Disp: 90 tablet, Rfl: 3   metronidazole (NORITATE) 1 % cream, Apply topically daily. Use as needed for skin breakout, Disp: 60 g, Rfl: 0   Multiple Vitamins-Minerals (MULTIVITAMIN WITH MINERALS) tablet, Take 1 tablet by mouth daily., Disp: , Rfl:    nystatin (MYCOSTATIN) 100000 UNIT/ML suspension, Take 5 mLs (500,000 Units total) by mouth as needed (thrush). Swish, hold in mouth and spit up to 4x a day as needed, Disp: 120 mL, Rfl: 2   OCTAGAM 20 GM/200ML SOLN, Inject into the vein., Disp: , Rfl:    OCTAGAM 5 GM/50ML SOLN, Inject into the vein., Disp: , Rfl:    pyridostigmine (MESTINON) 60 MG tablet, Take 1 tablet (60 mg total) by mouth 3 (three) times daily., Disp: 90 tablet, Rfl: 6  Current Facility-Administered Medications:    0.9 %  sodium chloride infusion, 500 mL, Intravenous, Continuous, Faith Burn, MD  Allergies:  Allergies  Allergen Reactions   Prednisone Shortness Of Breath, Swelling, Palpitations, Dermatitis and Hypertension    Patient reports having an intolerance to medication. Reports medication caused swelling and metallic taste in mouth.    Past Medical History, Surgical history, Social history, and Family History were reviewed and updated.  Review of Systems: Review of Systems  Constitutional: Negative.   HENT:  Negative.    Eyes: Negative.   Respiratory: Negative.    Cardiovascular: Negative.   Gastrointestinal: Negative.   Endocrine: Negative.   Genitourinary: Negative.     Musculoskeletal: Negative.   Skin: Negative.   Neurological: Negative.   Hematological: Negative.   Psychiatric/Behavioral: Negative.      Physical Exam:  height is 4\' 10"  (1.473 m) and weight is 110 lb 0.6 oz (49.9 kg). Her oral temperature is 98 F (36.7 C). Her blood pressure is 129/57 (abnormal) and her pulse is 101 (abnormal). Her respiration is 20 and oxygen saturation is 100%.   Physical Exam Vitals reviewed.  HENT:     Head: Normocephalic and atraumatic.  Eyes:     Pupils: Pupils are equal, round, and reactive to light.  Cardiovascular:     Rate and Rhythm: Normal rate and regular rhythm.     Heart sounds: Normal heart sounds.  Pulmonary:     Effort: Pulmonary effort is normal.     Breath sounds: Normal breath sounds.  Abdominal:     General: Bowel sounds are normal.     Palpations: Abdomen is soft.  Musculoskeletal:        General: No tenderness or deformity. Normal range of motion.     Cervical back: Normal range of motion.  Lymphadenopathy:     Cervical: No cervical adenopathy.  Skin:    General: Skin is warm and dry.     Findings: No erythema or rash.  Neurological:     Mental Status: She is alert and oriented to person, place, and time.  Psychiatric:        Behavior: Behavior normal.        Thought Content: Thought content normal.        Judgment: Judgment normal.    Lab Results  Component Value Date   WBC 8.3 12/28/2022   HGB 11.4 (L) 12/28/2022   HCT 34.9 (L) 12/28/2022   MCV 98.6 12/28/2022   PLT 271 12/28/2022     Chemistry      Component Value Date/Time   NA 145 12/28/2022 1502   NA 142 09/12/2022 1204   K 4.4 12/28/2022 1502   CL 109 12/28/2022 1502   CO2 27 12/28/2022 1502   BUN 24 (H) 12/28/2022 1502   BUN 17 09/12/2022 1204   CREATININE 1.08 (H) 12/28/2022 1502   CREATININE 0.61 05/06/2013 1003      Component Value Date/Time   CALCIUM 9.8 12/28/2022 1502   ALKPHOS 54 12/28/2022 1502   AST 22 12/28/2022 1502   ALT 24 12/28/2022  1502   BILITOT 0.5 12/28/2022 1502      Assessment and Plan-   Ms. Faith Massey is a very charming 77 year old white female.  She is followed up for her thymoma and for the pulmonary embolism.  Everything looks fantastic from my point of view.  Again, the real issue now is this fistula.  Again, she will have this fixed next month.  Again, I do not  see any problems with her being on Eliquis for the surgery.  I would just have the Eliquis stopped 3 days before the surgery.  She can probably restart the Eliquis a day after surgery.  As far as her thymoma, I really do not see an issue with this.  I will see her when she is in the hospital.  I will subsequently plan to have her come back to the office in January.  We will try to get her through the Holiday season.

## 2022-12-29 ENCOUNTER — Telehealth (HOSPITAL_COMMUNITY): Payer: Self-pay

## 2022-12-29 ENCOUNTER — Encounter: Payer: Self-pay | Admitting: Family Medicine

## 2022-12-29 LAB — IRON AND IRON BINDING CAPACITY (CC-WL,HP ONLY)
Iron: 100 ug/dL (ref 28–170)
Saturation Ratios: 24 % (ref 10.4–31.8)
TIBC: 419 ug/dL (ref 250–450)
UIBC: 319 ug/dL (ref 148–442)

## 2022-12-29 NOTE — Telephone Encounter (Signed)
Detailed instructions left on the patient's answering machine. Asked to call back with any questions. S.Aby Gessel CCT

## 2022-12-29 NOTE — Telephone Encounter (Signed)
Patient stated she is driving and unable to retrieve her messages.  Patient wants instructions sent to her MyChart.

## 2023-01-02 ENCOUNTER — Ambulatory Visit: Payer: Medicare HMO

## 2023-01-02 ENCOUNTER — Telehealth (HOSPITAL_COMMUNITY): Payer: Self-pay

## 2023-01-02 DIAGNOSIS — G8929 Other chronic pain: Secondary | ICD-10-CM

## 2023-01-02 DIAGNOSIS — S46211A Strain of muscle, fascia and tendon of other parts of biceps, right arm, initial encounter: Secondary | ICD-10-CM

## 2023-01-02 DIAGNOSIS — M25611 Stiffness of right shoulder, not elsewhere classified: Secondary | ICD-10-CM | POA: Diagnosis not present

## 2023-01-02 DIAGNOSIS — M25511 Pain in right shoulder: Secondary | ICD-10-CM | POA: Diagnosis not present

## 2023-01-02 DIAGNOSIS — M6281 Muscle weakness (generalized): Secondary | ICD-10-CM | POA: Diagnosis not present

## 2023-01-02 DIAGNOSIS — R252 Cramp and spasm: Secondary | ICD-10-CM

## 2023-01-02 NOTE — Therapy (Signed)
OUTPATIENT PHYSICAL THERAPY TREATMENT       Patient Name: Faith Massey MRN: [id number] DOB:March 23, 1945, 77 y.o., female Today's Date: 01/02/2023  END OF SESSION:  PT End of Session - 01/02/23 1159     Visit Number 13    Date for PT Re-Evaluation 01/23/23    Authorization Type [employer] M[employer]    Progress Note Due on Visit 17    PT Start Time 1103    PT Stop Time 1148    PT Time Calculation (min) 45 min    Activity Tolerance Patient tolerated treatment well    Behavior During Therapy WFL for tasks assessed/performed                        Past Medical History:  Diagnosis Date   Breast CA (HCC)    History of radiation therapy    Chest 06/09/2022 - 07/21/2022 - Dr. 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   Myasthenic syndrome (HCC) 04/12/2022   CVA (cerebral vascular accident) (HCC) 03/28/2022   HTN (hypertension) 03/28/2022   Chest mass 03/28/2022   Right knee injury 02/14/2017   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 10/16/2012   Osteoporosis 10/12/2012    PCP: Pearline Cables, MD  REFERRING PROVIDER: Cristie Hem, PA-C  REFERRING DIAG: [phone] (ICD-10-CM) - Chronic right shoulder pain  THERAPY DIAG:  Chronic right shoulder pain  Rupture of right proximal biceps tendon, initial encounter  Acute pain of right shoulder  Stiffness of right shoulder, not  elsewhere classified  Muscle weakness (generalized)  Cramp and spasm  RATIONALE FOR EVALUATION AND TREATMENT: Rehabilitation  ONSET DATE: 06/20/22  NEXT MD VISIT: TBD   SUBJECTIVE:                                                                                                                                                                                      SUBJECTIVE STATEMENT: Pain is better today, like a dull ache. She feels like she doesn't have enough strength in  her shoulder to reach in front of her.   EVAL:  I had so much pain last night I had to take a Tramadol. I can't get a blouse out of the closet. Hurts to reach out. Can't reach behind head and can't lift arm without support of other arm. A little better since injection last week. Just recently went off prednisone which she had for IVIG therapy. Diagnosed with Myasthenia gravis in January of this year (associated with the mass). 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 Tore biceps when she was changing the filter on her water filtering system, heard pop. She uses cane intermittently due to balance issues from Myasthenia gravis and prednisone.  Hand dominance: Right  PAIN:  Are you having pain? No  PERTINENT HISTORY: MG, h/o radiation (March and May 2024) for mediastinal mass, HTN, OP  PRECAUTIONS: None  RED FLAGS: None   WEIGHT BEARING RESTRICTIONS: No  FALLS:  Has patient fallen in last 6 months? No  LIVING ENVIRONMENT: Lives with: lives alone Lives in: House/apartment Stairs: No Has following equipment at home: Single point cane  OCCUPATION: retired  PLOF: Independent with household mobility with device  PATIENT GOALS:full function of her R arm   OBJECTIVE:   DIAGNOSTIC FINDINGS:  [place]: Limited musculoskeletal ultrasound of the right upper extremity, right  shoulder was performed.  Short axis evaluation of the bicipital groove  shows no cortical irregularity of the humeral head or groove of  the  shoulder.  Within the bicipital groove there is a hypoechoic space with  absent visualization of the proximal head of the biceps tendon, indicative  of likely proximal bicep tendon tear.  Short and long axis evaluation of  the biceps tendon does show retraction into the proximal aspect of the  bicep musculature.  Evaluation of the [facility] joint shows at least moderate  arthritic changes without significant bursal distention.   PATIENT SURVEYS:  Quick Dash 68.2 / 100 = 68.2 % ; 11/28/22 - 63.6 / 100 = 63.6 %  COGNITION: Overall cognitive status: Within functional limits for tasks assessed     SENSATION: WFL  POSTURE: R shoulder anterior, depressed R shoulder, scapula and R ilia  UPPER EXTREMITY ROM:   A/PROM Right eval R 11/28/22 L 11/28/22 R 12/28/22  Shoulder flexion 20/157 passively with clunk A: 33 with excessive substitution  P: 132 150 A: 33 very difficult P: 147   Shoulder extension WNL 48 68   Shoulder abduction 58/177 A: 59 P: 160 155 A: 56* P: full  Shoulder adduction      Shoulder internal rotation 55/62 A: 75 P: 65 85 A: 50 with blocking of humeral head P: 68  Shoulder external rotation 65/85 very uncomfortable A: 61 P: 85 90 A: 55 P: 68*   Elbow flexion full     Elbow extension full     (Blank rows = not tested)  UPPER EXTREMITY MMT:  MMT Right eval R 11/28/22 L 11/28/22  Shoulder flexion 2- 2- 5  Shoulder extension 4+ 4 4  Shoulder abduction 4-* with elbow bent 2- 4+  Shoulder adduction     Shoulder internal rotation 4- 4- 4+  Shoulder external rotation 5 4- 4  Middle trapezius     Lower trapezius     Elbow flexion 4* 4 5  Elbow extension 5 mild pain  4 4+  Wrist pronation     Wrist supination     Grip strength (lbs) 10 6 11   (Blank rows = not tested)  PALPATION:  Marked UT, pecs, ant shoulder, IS  Decreased stability in ant R shoulder joint    TODAY'S TREATMENT:                                                                                                                                          DATE:  01/02/23 THERAPEUTIC EXERCISE: to improve flexibility, strength and mobility.  Demonstration, verbal and tactile cues throughout for technique. UBE L1.0 x 6 min Bent over row + scap retraction x 10 1#; x 10 2# Bent over shld ext + scap retraction x 10 1#; x 10 2# Seated table slide AROM with 2# weight x 15 R shoulder Isometric step out R shld flexion 10x YTB  12/28/22 THERAPEUTIC EXERCISE: to improve flexibility, strength and mobility.  Demonstration, verbal and tactile cues throughout for technique. Pulleys - R shoulder flexion & scaption x 3 min each, within pain-free ROM Seated Swiffer R shoulder flexion and scaption 2 x 10 each Bent over B scap retraction & shoulder row 2 x 10 R 1#, L 2# Bent over B scap retraction & shoulder extension 2 x 10 R 1#, L 2#  MANUAL THERAPY: to decrease muscle spasm and pain and improve mobility  ROM measurements taken KT tape for ant support of R shoulder - 2 I strips; one ant deltoid to upper trap applied with post glide of humeral head, one strip mid deltoid to UT   12/22/22 THERAPEUTIC EXERCISE: to improve flexibility, strength and mobility.  Demonstration, verbal and tactile cues throughout for technique. Pulleys - R shoulder flexion & scaption x 3 min each, within pain-free ROM Seated Swiffer R shoulder flexion and scaption 2 x 10 each Supine wand R shoulder flexion AAROM x 5 with wand/cane held horizontal with slight flexion at elbows, x 10 with wand held vertical - pt preferring vertical hold but cues still necessary to avoid excessive horiz ADD, keeping flexion pattern in neutral alignment with shoulder (removed horiz wand AAROM from HEP) S/L R scap retraction + shoulder ER x 10  Bent over B scap retraction & shoulder row 2 x 10 Bent over B scap retraction & shoulder extension 2 x 10 Standing RTB scap retraction + shoulder row x 10 Standing RTB scap retraction + shoulder extension x 10 - pt  may need to continue with YTB at home R shoulder YTB isometric IR/ER step-out x 2 - discontinued due to poor control   12/20/22 THERAPEUTIC EXERCISE: to improve flexibility, strength and mobility.  Demonstration, verbal and tactile cues throughout for technique. Pulleys - R shoulder flexion & scaption x 3 min each, within pain-free ROM Supine isometric R shld ER/IR 5x5" S/L ER R shld x 10  Standing shld ext YTB x 20  Isometric step out ER YTB 10x5" Mid row YTB x 10 - cuing to avoid shoulder shrug   12/14/22 THERAPEUTIC EXERCISE: to improve flexibility, strength and  mobility.  Demonstration, verbal and tactile cues throughout for technique. Pulleys - R shoulder flexion & scaption x 3 min each, within pain-free ROM R shoulder flexion finger ladder x 5 - pt to try "walking" up wall rather that "sliding" at home Seated Swiffer R shoulder flexion and scaption 2 x 10 each Seated R shoulder protraction/retraction with UE supported on Swiffer handle at ~90 flexion Supine B wand protraction/retraction x 5 - discontinued due to increased pain/poor control Supine wand R shoulder AAROM x 5 with wand/cane held horizontal with slight flexion at elbows, x 5 with wand held vertical - pt thinks vertical was a little better but instructions provided for both options at home   PATIENT EDUCATION: Education details: Health and safety inspector wearing and removal instructions Person educated: Patient Education method: Explanation, Demonstration, Tactile cues, Verbal cues, Handouts, and MedBridgeGO access information provided to pt Education comprehension: verbalized understanding, returned demonstration, verbal cues required, tactile cues required, and needs further education  HOME EXERCISE PROGRAM: Access Code: [email or web address] URL: [email or web address] Date: 12/23/2022 Prepared by: Glenetta Hew  Exercises - Seated Bilateral Shoulder Flexion Towel Slide at Table Top  - 1 x daily - 7 x weekly -  1 sets - 10 reps - Seated Shoulder Towel Slides Scaption at Table  - 1 x daily - 7 x weekly - 1 sets - 10 reps - Seated Scapular Retraction  - 1 x daily - 7 x weekly - 1-3 sets - 10 reps - 2-3 sec hold - Seated Scapular Retraction with External Rotation  - 1 x daily - 7 x weekly - 1 sets - 10 reps - Shoulder Flexion Wall Slide with Towel  - 1 x daily - 7 x weekly - 2 sets - 5 reps - Isometric Shoulder Flexion at Wall  - 1 x daily - 7 x weekly - 1 sets - 5 reps - 10 sec hold - Standing Isometric Shoulder Internal Rotation at Doorway  - 1 x daily - 7 x weekly - 1 sets - 5 reps - 10 sec hold - Standing Isometric Shoulder External Rotation with Doorway (Mirrored)  - 1 x daily - 7 x weekly - 1 sets - 5 reps - 10 sec  hold - Standing Bilateral Low Shoulder Row with Anchored Resistance  - 1 x daily - 7 x weekly - 2 sets - 10 reps - 5 sec hold - Scapular Retraction with Resistance Advanced  - 1 x daily - 7 x weekly - 2 sets - 10 reps - 5 sec hold - Shoulder external rotation  - 1 x daily - 3 x weekly - 2 sets - 5 reps - Supine Shoulder Flexion Overhead with Dowel (Mirrored)  - 1 x daily - 7 x weekly - 2 sets - 5 reps - 3 sec hold - Seated Bent Over Shoulder Row with Dumbbells  - 1 x daily - 7 x weekly - 2 sets - 10 reps - 3 sec hold - Seated Shoulder Extension  - 1 x daily - 7 x weekly - 2 sets - 10 reps - 3 sec hold   ASSESSMENT:  CLINICAL IMPRESSION: Continued emphasis on exercises to improve scapular stability. Cues required to maintain retracted and depressed scapula with fwd shoulder flexion. Cues needed with the isometric shoulder flexion to maintain neutral elbow as well to target ant deltoid. Pt notes benefit from KT tape for posture. Able to compete all interventions no increased pain.   OBJECTIVE IMPAIRMENTS: decreased ROM, decreased strength, increased muscle spasms,  impaired flexibility, impaired UE functional use, postural dysfunction, and pain.   ACTIVITY LIMITATIONS: carrying, lifting,  sleeping, bathing, toileting, dressing, self feeding, reach over head, and hygiene/grooming  PARTICIPATION LIMITATIONS: meal prep, cleaning, laundry, driving, and yard work  PERSONAL FACTORS: Time since onset of injury/illness/exacerbation and 3+ comorbidities: Myasthenia gravis, h/o radiation for mediastinal mass, HTN, OP  are also affecting patient's functional outcome.   REHAB POTENTIAL: Good  CLINICAL DECISION MAKING: Evolving/moderate complexity  EVALUATION COMPLEXITY: Moderate   GOALS: Goals reviewed with patient? Yes  SHORT TERM GOALS: Target date: 11/07/2022, extended to 12/26/2022  Patient will be independent with initial HEP.  Baseline:  Goal status: MET - 10/26/22  2.  Patient able to sleep without waking from shoulder pain  Baseline:  Goal status: MET - 12/14/22   LONG TERM GOALS: Target date: 12/05/2022, extended to 01/23/2023  Patient will be independent with advanced/ongoing HEP to improve outcomes and carryover.  Baseline:  Goal status: IN PROGRESS - 12/23/22 - met for current HEP  2.  Patient will report 75% improvement in R shoulder pain to improve QOL.  Baseline:  Goal status: IN PROGRESS - 12/23/22 - Pt reports minimal to no pain most of the time but still will have occasional "catching" pain with some motions  3. Improved R grip strength by 5-10#  Baseline: 10# Goal status: IN PROGRESS - 11/28/22 - 6#  4.  Patient to improve R shoulder AROM to [facility] without pain provocation to allow for increased ease of ADLs.  Baseline:  Goal status: IN PROGRESS - 11/28/22 - R shoulder AROM still significantly limited in all planes  5.  Patient will demonstrate improved functional R UE strength as demonstrated her ability to use her arm to perform grooming and hygiene. Baseline:  Goal status: IN PROGRESS - 12/23/22 - Pt remains unable to use R UE functionally with most daily ADLs  6  Patient will report 58/100 on Quick Dash to demonstrate improved functional ability.  Baseline:  68.2 / 100 = 68.2 % Goal status: IN PROGRESS  - 11/28/22 - 63.6 / 100 = 63.6 %   PLAN:  PT FREQUENCY: 2x/week  PT DURATION: 8 weeks  PLANNED INTERVENTIONS: Therapeutic exercises, Therapeutic activity, Neuromuscular re-education, Patient/Family education, Self Care, Joint mobilization, Aquatic Therapy, Dry Needling, Electrical stimulation, Spinal mobilization, Cryotherapy, Moist heat, Taping, Ionotophoresis 4mg /ml Dexamethasone, and Manual therapy  PLAN FOR NEXT SESSION: Shoulder/RC strength and shoulder AAROM; Scapular stabilization!; Review and update HEP as needed   Darleene Cleaver, PTA  01/02/2023, 12:01 PM

## 2023-01-02 NOTE — Telephone Encounter (Signed)
This patient called with questions about her test. They were answered and she will be here 01-03-23 for her test. S.Keundra Petrucelli CCT

## 2023-01-03 ENCOUNTER — Ambulatory Visit (HOSPITAL_COMMUNITY): Payer: Medicare HMO | Attending: Family Medicine

## 2023-01-03 ENCOUNTER — Encounter: Payer: Self-pay | Admitting: Family Medicine

## 2023-01-03 ENCOUNTER — Other Ambulatory Visit: Payer: Self-pay | Admitting: Family Medicine

## 2023-01-03 DIAGNOSIS — R0609 Other forms of dyspnea: Secondary | ICD-10-CM | POA: Diagnosis not present

## 2023-01-03 DIAGNOSIS — Z86711 Personal history of pulmonary embolism: Secondary | ICD-10-CM | POA: Diagnosis not present

## 2023-01-03 LAB — MYOCARDIAL PERFUSION IMAGING
LV dias vol: 37 mL (ref 46–106)
LV sys vol: 11 mL
Nuc Stress EF: 71 %
Peak HR: 104 {beats}/min
Rest HR: 71 {beats}/min
Rest Nuclear Isotope Dose: 10.6 mCi
SDS: 5
SRS: 1
SSS: 6
ST Depression (mm): 0 mm
Stress Nuclear Isotope Dose: 32.4 mCi
TID: 0.84

## 2023-01-03 MED ORDER — TECHNETIUM TC 99M TETROFOSMIN IV KIT
32.4000 | PACK | Freq: Once | INTRAVENOUS | Status: AC | PRN
  Administered 2023-01-03: 32.4 via INTRAVENOUS

## 2023-01-03 MED ORDER — TECHNETIUM TC 99M TETROFOSMIN IV KIT
10.6000 | PACK | Freq: Once | INTRAVENOUS | Status: AC | PRN
  Administered 2023-01-03: 10.6 via INTRAVENOUS

## 2023-01-03 MED ORDER — REGADENOSON 0.4 MG/5ML IV SOLN
0.4000 mg | Freq: Once | INTRAVENOUS | Status: AC
  Administered 2023-01-03: 0.4 mg via INTRAVENOUS

## 2023-01-04 ENCOUNTER — Encounter: Payer: Self-pay | Admitting: Physical Therapy

## 2023-01-04 ENCOUNTER — Encounter: Payer: Self-pay | Admitting: Family Medicine

## 2023-01-04 ENCOUNTER — Ambulatory Visit: Payer: Medicare HMO | Admitting: Physical Therapy

## 2023-01-04 DIAGNOSIS — M25611 Stiffness of right shoulder, not elsewhere classified: Secondary | ICD-10-CM

## 2023-01-04 DIAGNOSIS — M6281 Muscle weakness (generalized): Secondary | ICD-10-CM | POA: Diagnosis not present

## 2023-01-04 DIAGNOSIS — G8929 Other chronic pain: Secondary | ICD-10-CM | POA: Diagnosis not present

## 2023-01-04 DIAGNOSIS — R252 Cramp and spasm: Secondary | ICD-10-CM

## 2023-01-04 DIAGNOSIS — M25511 Pain in right shoulder: Secondary | ICD-10-CM | POA: Diagnosis not present

## 2023-01-04 DIAGNOSIS — S46211A Strain of muscle, fascia and tendon of other parts of biceps, right arm, initial encounter: Secondary | ICD-10-CM | POA: Diagnosis not present

## 2023-01-04 NOTE — Therapy (Signed)
OUTPATIENT PHYSICAL THERAPY TREATMENT       Patient Name: Faith Massey MRN: [id number] DOB:February 10, 1946, 77 y.o., female Today's Date: 01/04/2023  END OF SESSION:  PT End of Session - 01/04/23 1100     Visit Number 14    Date for PT Re-Evaluation 01/23/23    Authorization Type [employer] MCR    Progress Note Due on Visit 17    PT Start Time 1100    PT Stop Time 1145    PT Time Calculation (min) 45 min    Activity Tolerance Patient tolerated treatment well    Behavior During Therapy WFL for tasks assessed/performed                         Past Medical History:  Diagnosis Date   Breast CA (HCC)    History of radiation therapy    Chest 06/09/2022 - 07/21/2022 - Dr. 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   Myasthenic syndrome (HCC) 04/12/2022   CVA (cerebral vascular accident) (HCC) 03/28/2022   HTN (hypertension) 03/28/2022   Chest mass 03/28/2022   Right knee injury 02/14/2017   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 10/16/2012   Osteoporosis 10/12/2012    PCP: Pearline Cables, MD  REFERRING PROVIDER: Cristie Hem, PA-C  REFERRING DIAG: [phone] (ICD-10-CM) - Chronic right shoulder pain  THERAPY DIAG:  Acute pain of right shoulder  Stiffness of right shoulder, not elsewhere classified  Muscle weakness (generalized)  Cramp and spasm  RATIONALE FOR  EVALUATION AND TREATMENT: Rehabilitation  ONSET DATE: 06/20/22  NEXT MD VISIT: TBD   SUBJECTIVE:                                                                                                                                                                                      SUBJECTIVE STATEMENT: Pt reports some aching very early this morning but better now. Her surgery for her fistula is scheduled for 02/08/23.  EVAL:  I had so much pain last night  I had to take a Tramadol. I can't get a blouse out of the closet. Hurts to reach out. Can't reach behind head and can't lift arm without support of other arm. A little better since injection last week. Just recently went off prednisone which she had for IVIG therapy. Diagnosed with Myasthenia gravis in January of this year (associated with the mass). 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 Tore biceps when she was changing the filter on her water filtering system, heard pop. She uses cane intermittently due to balance issues from Myasthenia gravis and prednisone.  Hand dominance: Right  PAIN:  Are you having pain? No  PERTINENT HISTORY: MG, h/o radiation (March and May 2024) for mediastinal mass, HTN, OP  PRECAUTIONS: None  RED FLAGS: None   WEIGHT BEARING RESTRICTIONS: No  FALLS:  Has patient fallen in last 6 months? No  LIVING ENVIRONMENT: Lives with: lives alone Lives in: House/apartment Stairs: No Has following equipment at home: Single point cane  OCCUPATION: retired  PLOF: Independent with household mobility with device  PATIENT GOALS:full function of her R arm   OBJECTIVE:   DIAGNOSTIC FINDINGS:  [place]: Limited musculoskeletal ultrasound of the right upper extremity, right  shoulder was performed.  Short axis evaluation of the bicipital groove  shows no cortical irregularity of the humeral head or groove of the  shoulder.  Within the bicipital groove there is a hypoechoic space with  absent visualization of  the proximal head of the biceps tendon, indicative  of likely proximal bicep tendon tear.  Short and long axis evaluation of  the biceps tendon does show retraction into the proximal aspect of the  bicep musculature.  Evaluation of the [facility] joint shows at least moderate  arthritic changes without significant bursal distention.   PATIENT SURVEYS:  Quick Dash 68.2 / 100 = 68.2 % ; 11/28/22 - 63.6 / 100 = 63.6 %  COGNITION: Overall cognitive status: Within functional limits for tasks assessed     SENSATION: WFL  POSTURE: R shoulder anterior, depressed R shoulder, scapula and R ilia  UPPER EXTREMITY ROM:   A/PROM Right eval R 11/28/22 L 11/28/22 R 12/28/22  Shoulder flexion 20/157 passively with clunk A: 33 with excessive substitution  P: 132 150 A: 33 very difficult P: 147   Shoulder extension WNL 48 68   Shoulder abduction 58/177 A: 59 P: 160 155 A: 56* P: full  Shoulder adduction      Shoulder internal rotation 55/62 A: 75 P: 65 85 A: 50 with blocking of humeral head P: 68  Shoulder external rotation 65/85 very uncomfortable A: 61 P: 85 90 A: 55 P: 68*   Elbow flexion full     Elbow extension full     (Blank rows = not tested)  UPPER EXTREMITY MMT:  MMT Right eval R 11/28/22 L 11/28/22  Shoulder flexion 2- 2- 5  Shoulder extension 4+ 4 4  Shoulder abduction 4-* with elbow bent 2- 4+  Shoulder adduction     Shoulder internal rotation 4- 4- 4+  Shoulder external rotation 5 4- 4  Middle trapezius     Lower trapezius     Elbow flexion 4* 4 5  Elbow extension 5 mild pain  4 4+  Wrist pronation     Wrist supination     Grip strength (lbs) 10 6 11   (Blank rows = not tested)   PALPATION:  Marked UT, pecs, ant shoulder, IS  Decreased stability in ant R shoulder joint  TODAY'S TREATMENT:                                                                                                                                         DATE:   01/04/23 THERAPEUTIC EXERCISE: to improve  flexibility, strength and mobility.  Demonstration, verbal and tactile cues throughout for technique. UBE L1.0 x 8 min 94' each fwd & back) Lat pull down 5# x 20 using bar for R shoulder AAROM into overhead ROM on release R finger ladder flexion and scaption x 10 each Modified "cabinet reaches" from counter top to top of wooden box placed on counter: Flexion x 10 Flexion with 3 oz putty tub Scaption with 3 oz putty tub Yellow theraputty grip squeezes   01/02/23 THERAPEUTIC EXERCISE: to improve flexibility, strength and mobility.  Demonstration, verbal and tactile cues throughout for technique. UBE L1.0 x 6 min Bent over row + scap retraction x 10 1#; x 10 2# Bent over shld ext + scap retraction x 10 1#; x 10 2# Seated table slide AROM with 2# weight x 15 R shoulder Isometric step out R shld flexion 10x YTB   12/28/22 THERAPEUTIC EXERCISE: to improve flexibility, strength and mobility.  Demonstration, verbal and tactile cues throughout for technique. Pulleys - R shoulder flexion & scaption x 3 min each, within pain-free ROM Seated Swiffer R shoulder flexion and scaption 2 x 10 each Bent over B scap retraction & shoulder row 2 x 10 R 1#, L 2# Bent over B scap retraction & shoulder extension 2 x 10 R 1#, L 2#  MANUAL THERAPY: to decrease muscle spasm and pain and improve mobility  ROM measurements taken KT tape for ant support of R shoulder - 2 I strips; one ant deltoid to upper trap applied with post glide of humeral head, one strip mid deltoid to UT   PATIENT EDUCATION: Education details: HEP update - yellow theraputty grip strengthening Person educated: Patient Education method: Explanation, Demonstration, and MedBridgeGO access information provided to pt Education comprehension: verbalized understanding and returned demonstration  HOME EXERCISE PROGRAM: Access Code: [email or web address] URL: [email or web address] Date: 12/23/2022 Prepared by: Glenetta Hew  Exercises -  Seated Bilateral Shoulder Flexion Towel Slide at Table Top  - 1 x daily - 7 x weekly - 1 sets - 10 reps - Seated Shoulder Towel Slides Scaption at Table  - 1 x daily - 7 x weekly - 1 sets - 10 reps - Seated Scapular Retraction  - 1 x daily - 7 x weekly - 1-3 sets - 10 reps - 2-3 sec hold - Seated Scapular Retraction with External Rotation  - 1 x daily - 7 x weekly - 1 sets - 10 reps - Shoulder Flexion Wall Slide with Towel  - 1 x daily - 7 x weekly - 2 sets - 5 reps - Isometric Shoulder Flexion at Wall  - 1 x daily - 7  x weekly - 1 sets - 5 reps - 10 sec hold - Standing Isometric Shoulder Internal Rotation at Doorway  - 1 x daily - 7 x weekly - 1 sets - 5 reps - 10 sec hold - Standing Isometric Shoulder External Rotation with Doorway (Mirrored)  - 1 x daily - 7 x weekly - 1 sets - 5 reps - 10 sec  hold - Standing Bilateral Low Shoulder Row with Anchored Resistance  - 1 x daily - 7 x weekly - 2 sets - 10 reps - 5 sec hold - Scapular Retraction with Resistance Advanced  - 1 x daily - 7 x weekly - 2 sets - 10 reps - 5 sec hold - Shoulder external rotation  - 1 x daily - 3 x weekly - 2 sets - 5 reps - Supine Shoulder Flexion Overhead with Dowel (Mirrored)  - 1 x daily - 7 x weekly - 2 sets - 5 reps - 3 sec hold - Seated Bent Over Shoulder Row with Dumbbells  - 1 x daily - 7 x weekly - 2 sets - 10 reps - 3 sec hold - Seated Shoulder Extension  - 1 x daily - 7 x weekly - 2 sets - 10 reps - 3 sec hold   ASSESSMENT:  CLINICAL IMPRESSION: Kaylianna reports increasing functional use of R shoulder with activities such as reaching fwd for the faucet or her clothes in the closet, but remains very limited with OH activities.  Continued to focus on activities encouraging increased muscle activation with AAROM of her R shoulder as well as more AROM functional movement patterns, while continuing to monitor for proper scapular activation to avoid shrugging.  Yellow Theraputty provided to allow her to work on grip  strengthening.  Oleen will benefit from continued skilled PT to address ongoing ROM and strength deficits to improve mobility and activity tolerance with decreased pain interference.   OBJECTIVE IMPAIRMENTS: decreased ROM, decreased strength, increased muscle spasms, impaired flexibility, impaired UE functional use, postural dysfunction, and pain.   ACTIVITY LIMITATIONS: carrying, lifting, sleeping, bathing, toileting, dressing, self feeding, reach over head, and hygiene/grooming  PARTICIPATION LIMITATIONS: meal prep, cleaning, laundry, driving, and yard work  PERSONAL FACTORS: Time since onset of injury/illness/exacerbation and 3+ comorbidities: Myasthenia gravis, h/o radiation for mediastinal mass, HTN, OP  are also affecting patient's functional outcome.   REHAB POTENTIAL: Good  CLINICAL DECISION MAKING: Evolving/moderate complexity  EVALUATION COMPLEXITY: Moderate   GOALS: Goals reviewed with patient? Yes  SHORT TERM GOALS: Target date: 11/07/2022, extended to 12/26/2022  Patient will be independent with initial HEP.  Baseline:  Goal status: MET - 10/26/22  2.  Patient able to sleep without waking from shoulder pain  Baseline:  Goal status: MET - 12/14/22   LONG TERM GOALS: Target date: 12/05/2022, extended to 01/23/2023  Patient will be independent with advanced/ongoing HEP to improve outcomes and carryover.  Baseline:  Goal status: IN PROGRESS - 12/23/22 - met for current HEP  2.  Patient will report 75% improvement in R shoulder pain to improve QOL.  Baseline:  Goal status: IN PROGRESS - 12/23/22 - Pt reports minimal to no pain most of the time but still will have occasional "catching" pain with some motions  3. Improved R grip strength by 5-10#  Baseline: 10# Goal status: IN PROGRESS - 12/28/22 - 11#  4.  Patient to improve R shoulder AROM to [facility] without pain provocation to allow for increased ease of ADLs.  Baseline:  Goal status: IN  PROGRESS - 12/28/22 - R shoulder AROM  still significantly limited in all planes  5.  Patient will demonstrate improved functional R UE strength as demonstrated her ability to use her arm to perform grooming and hygiene. Baseline:  Goal status: IN PROGRESS - 01/04/23 - Pt remains very limited R UE functional use with most daily ADLs, but notes improving ability to do things like reach for faucets or clothes in the closet  6  Patient will report 58/100 on Quick Dash to demonstrate improved functional ability.  Baseline: 68.2 / 100 = 68.2 % Goal status: IN PROGRESS  - 11/28/22 - 63.6 / 100 = 63.6 %   PLAN:  PT FREQUENCY: 2x/week  PT DURATION: 8 weeks  PLANNED INTERVENTIONS: Therapeutic exercises, Therapeutic activity, Neuromuscular re-education, Patient/Family education, Self Care, Joint mobilization, Aquatic Therapy, Dry Needling, Electrical stimulation, Spinal mobilization, Cryotherapy, Moist heat, Taping, Ionotophoresis 4mg /ml Dexamethasone, and Manual therapy  PLAN FOR NEXT SESSION: Shoulder/RC strength and shoulder AAROM; Scapular stabilization!; Review and update HEP as needed; possibly repeat [place] application   Marry Guan, PT  01/04/2023, 11:57 AM

## 2023-01-05 DIAGNOSIS — G709 Myoneural disorder, unspecified: Secondary | ICD-10-CM | POA: Diagnosis not present

## 2023-01-05 DIAGNOSIS — G7001 Myasthenia gravis with (acute) exacerbation: Secondary | ICD-10-CM | POA: Diagnosis not present

## 2023-01-05 DIAGNOSIS — Z7689 Persons encountering health services in other specified circumstances: Secondary | ICD-10-CM | POA: Diagnosis not present

## 2023-01-06 DIAGNOSIS — G709 Myoneural disorder, unspecified: Secondary | ICD-10-CM | POA: Diagnosis not present

## 2023-01-06 DIAGNOSIS — G7001 Myasthenia gravis with (acute) exacerbation: Secondary | ICD-10-CM | POA: Diagnosis not present

## 2023-01-09 ENCOUNTER — Ambulatory Visit: Payer: Medicare HMO

## 2023-01-09 DIAGNOSIS — S46211A Strain of muscle, fascia and tendon of other parts of biceps, right arm, initial encounter: Secondary | ICD-10-CM | POA: Diagnosis not present

## 2023-01-09 DIAGNOSIS — M6281 Muscle weakness (generalized): Secondary | ICD-10-CM | POA: Diagnosis not present

## 2023-01-09 DIAGNOSIS — R252 Cramp and spasm: Secondary | ICD-10-CM

## 2023-01-09 DIAGNOSIS — M25611 Stiffness of right shoulder, not elsewhere classified: Secondary | ICD-10-CM

## 2023-01-09 DIAGNOSIS — G8929 Other chronic pain: Secondary | ICD-10-CM | POA: Diagnosis not present

## 2023-01-09 DIAGNOSIS — M25511 Pain in right shoulder: Secondary | ICD-10-CM

## 2023-01-09 NOTE — Therapy (Signed)
OUTPATIENT PHYSICAL THERAPY TREATMENT       Patient Name: Faith Massey MRN: [id number] DOB:1945/11/17, 77 y.o., female Today's Date: 01/09/2023  END OF SESSION:  PT End of Session - 01/09/23 1231     Visit Number 15    Date for PT Re-Evaluation 01/23/23    Authorization Type [employer] MCR    Progress Note Due on Visit 17    PT Start Time 1150    PT Stop Time 1231    PT Time Calculation (min) 41 min    Activity Tolerance Patient tolerated treatment well    Behavior During Therapy WFL for tasks assessed/performed                          Past Medical History:  Diagnosis Date   Breast CA (HCC)    History of radiation therapy    Chest 06/09/2022 - 07/21/2022 - Dr. 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   Myasthenic syndrome (HCC) 04/12/2022   CVA (cerebral vascular accident) (HCC) 03/28/2022   HTN (hypertension) 03/28/2022   Chest mass 03/28/2022   Right knee injury 02/14/2017   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 10/16/2012   Osteoporosis 10/12/2012    PCP: Pearline Cables, MD  REFERRING PROVIDER: Cristie Hem, PA-C  REFERRING DIAG: [phone] (ICD-10-CM) - Chronic right shoulder pain  THERAPY DIAG:  Acute pain of right shoulder  Stiffness of right shoulder, not elsewhere classified  Muscle weakness (generalized)  Cramp and spasm  RATIONALE FOR  EVALUATION AND TREATMENT: Rehabilitation  ONSET DATE: 06/20/22  NEXT MD VISIT: TBD   SUBJECTIVE:                                                                                                                                                                                      SUBJECTIVE STATEMENT: Pt reports some nights she has trouble sleeping other nights not so bad. Reports that it is getting easier to turn her facet   EVAL:  I had  so much pain last night I had to take a Tramadol. I can't get a blouse out of the closet. Hurts to reach out. Can't reach behind head and can't lift arm without support of other arm. A little better since injection last week. Just recently went off prednisone which she had for IVIG therapy. Diagnosed with Myasthenia gravis in January of this year (associated with the mass). 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 Tore biceps when she was changing the filter on her water filtering system, heard pop. She uses cane intermittently due to balance issues from Myasthenia gravis and prednisone.  Hand dominance: Right  PAIN:  Are you having pain? No  PERTINENT HISTORY: MG, h/o radiation (March and May 2024) for mediastinal mass, HTN, OP  PRECAUTIONS: None  RED FLAGS: None   WEIGHT BEARING RESTRICTIONS: No  FALLS:  Has patient fallen in last 6 months? No  LIVING ENVIRONMENT: Lives with: lives alone Lives in: House/apartment Stairs: No Has following equipment at home: Single point cane  OCCUPATION: retired  PLOF: Independent with household mobility with device  PATIENT GOALS:full function of her R arm   OBJECTIVE:   DIAGNOSTIC FINDINGS:  [place]: Limited musculoskeletal ultrasound of the right upper extremity, right  shoulder was performed.  Short axis evaluation of the bicipital groove  shows no cortical irregularity of the humeral head or groove of the  shoulder.  Within the bicipital groove there is a hypoechoic space with  absent  visualization of the proximal head of the biceps tendon, indicative  of likely proximal bicep tendon tear.  Short and long axis evaluation of  the biceps tendon does show retraction into the proximal aspect of the  bicep musculature.  Evaluation of the [facility] joint shows at least moderate  arthritic changes without significant bursal distention.   PATIENT SURVEYS:  Quick Dash 68.2 / 100 = 68.2 % ; 11/28/22 - 63.6 / 100 = 63.6 %  COGNITION: Overall cognitive status: Within functional limits for tasks assessed     SENSATION: WFL  POSTURE: R shoulder anterior, depressed R shoulder, scapula and R ilia  UPPER EXTREMITY ROM:   A/PROM Right eval R 11/28/22 L 11/28/22 R 12/28/22  Shoulder flexion 20/157 passively with clunk A: 33 with excessive substitution  P: 132 150 A: 33 very difficult P: 147   Shoulder extension WNL 48 68   Shoulder abduction 58/177 A: 59 P: 160 155 A: 56* P: full  Shoulder adduction      Shoulder internal rotation 55/62 A: 75 P: 65 85 A: 50 with blocking of humeral head P: 68  Shoulder external rotation 65/85 very uncomfortable A: 61 P: 85 90 A: 55 P: 68*   Elbow flexion full     Elbow extension full     (Blank rows = not tested)  UPPER EXTREMITY MMT:  MMT Right eval R 11/28/22 L 11/28/22  Shoulder flexion 2- 2- 5  Shoulder extension 4+ 4 4  Shoulder abduction 4-* with elbow bent 2- 4+  Shoulder adduction     Shoulder internal rotation 4- 4- 4+  Shoulder external rotation 5 4- 4  Middle trapezius     Lower trapezius     Elbow flexion 4* 4 5  Elbow extension 5 mild pain  4 4+  Wrist pronation     Wrist supination     Grip strength (lbs) 10 6 11   (Blank rows = not tested)   PALPATION:  Marked UT, pecs, ant shoulder, IS  Decreased stability in  ant R shoulder joint    TODAY'S TREATMENT:                                                                                                                                         DATE:  01/09/23 THERAPEUTIC  EXERCISE: to improve flexibility, strength and mobility.  Demonstration, verbal and tactile cues throughout for technique. UBE L1.0 x 6 min  4 min fwd & 2 back) Finger ladder R shoulder: -  flexion 2x5 - abduction x 10 Standing R shoulder table slide with 1# weight x 15; scaption slide x 15 Lat pull down 5 # and rows 10# x 10 each  01/04/23 THERAPEUTIC EXERCISE: to improve flexibility, strength and mobility.  Demonstration, verbal and tactile cues throughout for technique. UBE L1.0 x 8 min 94' each fwd & back) Lat pull down 5# x 20 using bar for R shoulder AAROM into overhead ROM on release R finger ladder flexion and scaption x 10 each Modified "cabinet reaches" from counter top to top of wooden box placed on counter: Flexion x 10 Flexion with 3 oz putty tub Scaption with 3 oz putty tub Yellow theraputty grip squeezes   01/02/23 THERAPEUTIC EXERCISE: to improve flexibility, strength and mobility.  Demonstration, verbal and tactile cues throughout for technique. UBE L1.0 x 6 min Bent over row + scap retraction x 10 1#; x 10 2# Bent over shld ext + scap retraction x 10 1#; x 10 2# Seated table slide AROM with 2# weight x 15 R shoulder Isometric step out R shld flexion 10x YTB   12/28/22 THERAPEUTIC EXERCISE: to improve flexibility, strength and mobility.  Demonstration, verbal and tactile cues throughout for technique. Pulleys - R shoulder flexion & scaption x 3 min each, within pain-free ROM Seated Swiffer R shoulder flexion and scaption 2 x 10 each Bent over B scap retraction & shoulder row 2 x 10 R 1#, L 2# Bent over B scap retraction & shoulder extension 2 x 10 R 1#, L 2#  MANUAL THERAPY: to decrease muscle spasm and pain and improve mobility  ROM measurements taken KT tape for ant support of R shoulder - 2 I strips; one ant deltoid to upper trap applied with post glide of humeral head, one strip mid deltoid to UT   PATIENT EDUCATION: Education details: HEP update - yellow  theraputty grip strengthening Person educated: Patient Education method: Explanation, Demonstration, and MedBridgeGO access information provided to pt Education comprehension: verbalized understanding and returned demonstration  HOME EXERCISE PROGRAM: Access Code: [email or web address] URL: [email or web address] Date: 12/23/2022 Prepared by: Glenetta Hew  Exercises - Seated Bilateral Shoulder Flexion Towel Slide at Table Top  - 1 x daily - 7 x weekly - 1 sets - 10 reps - Seated Shoulder Towel Slides Scaption at Table  - 1 x daily - 7 x weekly - 1 sets - 10 reps - Seated Scapular Retraction  -  1 x daily - 7 x weekly - 1-3 sets - 10 reps - 2-3 sec hold - Seated Scapular Retraction with External Rotation  - 1 x daily - 7 x weekly - 1 sets - 10 reps - Shoulder Flexion Wall Slide with Towel  - 1 x daily - 7 x weekly - 2 sets - 5 reps - Isometric Shoulder Flexion at Wall  - 1 x daily - 7 x weekly - 1 sets - 5 reps - 10 sec hold - Standing Isometric Shoulder Internal Rotation at Doorway  - 1 x daily - 7 x weekly - 1 sets - 5 reps - 10 sec hold - Standing Isometric Shoulder External Rotation with Doorway (Mirrored)  - 1 x daily - 7 x weekly - 1 sets - 5 reps - 10 sec  hold - Standing Bilateral Low Shoulder Row with Anchored Resistance  - 1 x daily - 7 x weekly - 2 sets - 10 reps - 5 sec hold - Scapular Retraction with Resistance Advanced  - 1 x daily - 7 x weekly - 2 sets - 10 reps - 5 sec hold - Shoulder external rotation  - 1 x daily - 3 x weekly - 2 sets - 5 reps - Supine Shoulder Flexion Overhead with Dowel (Mirrored)  - 1 x daily - 7 x weekly - 2 sets - 5 reps - 3 sec hold - Seated Bent Over Shoulder Row with Dumbbells  - 1 x daily - 7 x weekly - 2 sets - 10 reps - 3 sec hold - Seated Shoulder Extension  - 1 x daily - 7 x weekly - 2 sets - 10 reps - 3 sec hold   ASSESSMENT:  CLINICAL IMPRESSION: Leeloo reports increasing functional use of R shoulder with activities such as reaching fwd for  the sink faucet. Continued to focus on activities encouraging increased muscle activation with AROM functional movement patterns, while continuing to monitor for proper scapular activation to avoid shrugging. Pt with good demo of exercises and no increased pain, tactile and verbal cues given throughout. Tikita will benefit from continued skilled PT to address ongoing ROM and strength deficits to improve mobility and activity tolerance with decreased pain interference.   OBJECTIVE IMPAIRMENTS: decreased ROM, decreased strength, increased muscle spasms, impaired flexibility, impaired UE functional use, postural dysfunction, and pain.   ACTIVITY LIMITATIONS: carrying, lifting, sleeping, bathing, toileting, dressing, self feeding, reach over head, and hygiene/grooming  PARTICIPATION LIMITATIONS: meal prep, cleaning, laundry, driving, and yard work  PERSONAL FACTORS: Time since onset of injury/illness/exacerbation and 3+ comorbidities: Myasthenia gravis, h/o radiation for mediastinal mass, HTN, OP  are also affecting patient's functional outcome.   REHAB POTENTIAL: Good  CLINICAL DECISION MAKING: Evolving/moderate complexity  EVALUATION COMPLEXITY: Moderate   GOALS: Goals reviewed with patient? Yes  SHORT TERM GOALS: Target date: 11/07/2022, extended to 12/26/2022  Patient will be independent with initial HEP.  Baseline:  Goal status: MET - 10/26/22  2.  Patient able to sleep without waking from shoulder pain  Baseline:  Goal status: MET - 12/14/22   LONG TERM GOALS: Target date: 12/05/2022, extended to 01/23/2023  Patient will be independent with advanced/ongoing HEP to improve outcomes and carryover.  Baseline:  Goal status: IN PROGRESS - 12/23/22 - met for current HEP  2.  Patient will report 75% improvement in R shoulder pain to improve QOL.  Baseline:  Goal status: IN PROGRESS - 12/23/22 - Pt reports minimal to no pain most of the time but  still will have occasional "catching" pain with  some motions  3. Improved R grip strength by 5-10#  Baseline: 10# Goal status: IN PROGRESS - 12/28/22 - 11#  4.  Patient to improve R shoulder AROM to [facility] without pain provocation to allow for increased ease of ADLs.  Baseline:  Goal status: IN PROGRESS - 12/28/22 - R shoulder AROM still significantly limited in all planes  5.  Patient will demonstrate improved functional R UE strength as demonstrated her ability to use her arm to perform grooming and hygiene. Baseline:  Goal status: IN PROGRESS - 01/04/23 - Pt remains very limited R UE functional use with most daily ADLs, but notes improving ability to do things like reach for faucets or clothes in the closet  6  Patient will report 58/100 on Quick Dash to demonstrate improved functional ability.  Baseline: 68.2 / 100 = 68.2 % Goal status: IN PROGRESS  - 11/28/22 - 63.6 / 100 = 63.6 %   PLAN:  PT FREQUENCY: 2x/week  PT DURATION: 8 weeks  PLANNED INTERVENTIONS: Therapeutic exercises, Therapeutic activity, Neuromuscular re-education, Patient/Family education, Self Care, Joint mobilization, Aquatic Therapy, Dry Needling, Electrical stimulation, Spinal mobilization, Cryotherapy, Moist heat, Taping, Ionotophoresis 4mg /ml Dexamethasone, and Manual therapy  PLAN FOR NEXT SESSION: Shoulder/RC strength and shoulder AAROM; Scapular stabilization!; Review and update HEP as needed; possibly repeat Ktape application   Darleene Cleaver, PTA  01/09/2023, 12:32 PM

## 2023-01-11 ENCOUNTER — Ambulatory Visit: Payer: Medicare HMO

## 2023-01-11 DIAGNOSIS — S46211A Strain of muscle, fascia and tendon of other parts of biceps, right arm, initial encounter: Secondary | ICD-10-CM | POA: Diagnosis not present

## 2023-01-11 DIAGNOSIS — M25511 Pain in right shoulder: Secondary | ICD-10-CM

## 2023-01-11 DIAGNOSIS — M25611 Stiffness of right shoulder, not elsewhere classified: Secondary | ICD-10-CM

## 2023-01-11 DIAGNOSIS — R252 Cramp and spasm: Secondary | ICD-10-CM | POA: Diagnosis not present

## 2023-01-11 DIAGNOSIS — M6281 Muscle weakness (generalized): Secondary | ICD-10-CM

## 2023-01-11 DIAGNOSIS — G8929 Other chronic pain: Secondary | ICD-10-CM | POA: Diagnosis not present

## 2023-01-11 NOTE — Therapy (Signed)
OUTPATIENT PHYSICAL THERAPY TREATMENT       Patient Name: Faith Massey MRN: [id number] DOB:06/26/1945, 77 y.o., female Today's Date: 01/11/2023  END OF SESSION:  PT End of Session - 01/11/23 1237     Visit Number 16    Date for PT Re-Evaluation 01/23/23    Authorization Type [employer] MCR    Progress Note Due on Visit 17    PT Start Time 1152    PT Stop Time 1234    PT Time Calculation (min) 42 min    Activity Tolerance Patient tolerated treatment well    Behavior During Therapy WFL for tasks assessed/performed                           Past Medical History:  Diagnosis Date   Breast CA (HCC)    History of radiation therapy    Chest 06/09/2022 - 07/21/2022 - Dr. 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   Myasthenic syndrome (HCC) 04/12/2022   CVA (cerebral vascular accident) (HCC) 03/28/2022   HTN (hypertension) 03/28/2022   Chest mass 03/28/2022   Right knee injury 02/14/2017   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 10/16/2012   Osteoporosis 10/12/2012    PCP: Pearline Cables, MD  REFERRING PROVIDER: Cristie Hem, PA-C  REFERRING DIAG: [phone] (ICD-10-CM) - Chronic right shoulder pain  THERAPY DIAG:  Acute pain of right shoulder  Stiffness of right shoulder, not elsewhere classified  Muscle weakness (generalized)  Cramp and spasm  RATIONALE  FOR EVALUATION AND TREATMENT: Rehabilitation  ONSET DATE: 06/20/22  NEXT MD VISIT: TBD   SUBJECTIVE:                                                                                                                                                                                      SUBJECTIVE STATEMENT: Pt reports turning on her facet becoming easier now.  EVAL:  I had so much pain last night I had to take a Tramadol. I can't get  a blouse out of the closet. Hurts to reach out. Can't reach behind head and can't lift arm without support of other arm. A little better since injection last week. Just recently went off prednisone which she had for IVIG therapy. Diagnosed with Myasthenia gravis in January of this year (associated with the mass). 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 Tore biceps when she was changing the filter on her water filtering system, heard pop. She uses cane intermittently due to balance issues from Myasthenia gravis and prednisone.  Hand dominance: Right  PAIN:  Are you having pain? No  PERTINENT HISTORY: MG, h/o radiation (March and May 2024) for mediastinal mass, HTN, OP  PRECAUTIONS: None  RED FLAGS: None   WEIGHT BEARING RESTRICTIONS: No  FALLS:  Has patient fallen in last 6 months? No  LIVING ENVIRONMENT: Lives with: lives alone Lives in: House/apartment Stairs: No Has following equipment at home: Single point cane  OCCUPATION: retired  PLOF: Independent with household mobility with device  PATIENT GOALS:full function of her R arm   OBJECTIVE:   DIAGNOSTIC FINDINGS:  [place]: Limited musculoskeletal ultrasound of the right upper extremity, right  shoulder was performed.  Short axis evaluation of the bicipital groove  shows no cortical irregularity of the humeral head or groove of the  shoulder.  Within the bicipital groove there is a hypoechoic space with  absent visualization of the proximal head of the biceps tendon, indicative  of  likely proximal bicep tendon tear.  Short and long axis evaluation of  the biceps tendon does show retraction into the proximal aspect of the  bicep musculature.  Evaluation of the [facility] joint shows at least moderate  arthritic changes without significant bursal distention.   PATIENT SURVEYS:  Quick Dash 68.2 / 100 = 68.2 % ; 11/28/22 - 63.6 / 100 = 63.6 %  COGNITION: Overall cognitive status: Within functional limits for tasks assessed     SENSATION: WFL  POSTURE: R shoulder anterior, depressed R shoulder, scapula and R ilia  UPPER EXTREMITY ROM:   A/PROM Right eval R 11/28/22 L 11/28/22 R 12/28/22 R 01/11/23  Shoulder flexion 20/157 passively with clunk A: 33 with excessive substitution  P: 132 150 A: 33 very difficult P: 147  AROM- 120   Shoulder extension WNL 48 68    Shoulder abduction 58/177 A: 59 P: 160 155 A: 56* P: full AROM- 110  Shoulder adduction       Shoulder internal rotation 55/62 A: 75 P: 65 85 A: 50 with blocking of humeral head P: 68 FIR- to L4-L5  Shoulder external rotation 65/85 very uncomfortable A: 61 P: 85 90 A: 55 P: 68*    Elbow flexion full      Elbow extension full      (Blank rows = not tested)  UPPER EXTREMITY MMT:  MMT Right eval R 11/28/22 L 11/28/22  Shoulder flexion 2- 2- 5  Shoulder extension 4+ 4 4  Shoulder abduction 4-* with elbow bent 2- 4+  Shoulder adduction     Shoulder internal rotation 4- 4- 4+  Shoulder external rotation 5 4- 4  Middle trapezius     Lower trapezius     Elbow flexion 4* 4 5  Elbow extension 5 mild pain  4 4+  Wrist pronation     Wrist supination     Grip strength (lbs) 10 6 11   (Blank rows = not tested)   PALPATION:  Marked UT, pecs, ant shoulder, IS  Decreased stability  in ant R shoulder joint    TODAY'S TREATMENT:                                                                                                                                         DATE:  01/11/23 THERAPEUTIC EXERCISE: to improve  flexibility, strength and mobility.  Demonstration, verbal and tactile cues throughout for technique. UBE L1.0 x 6 min  4 min fwd & 2 back) Finger ladder R shld flexion 2 x 10; R shld abduction 2 x 10  Rows 10# low grips 3x10 Shoulder isometric step outs with YTB: flexion 5x, abduction 5x Lat pull 10# x 20   01/09/23 THERAPEUTIC EXERCISE: to improve flexibility, strength and mobility.  Demonstration, verbal and tactile cues throughout for technique. UBE L1.0 x 6 min  4 min fwd & 2 back) Finger ladder R shoulder: -  flexion 2x5 - abduction x 10 Standing R shoulder table slide with 1# weight x 15; scaption slide x 15 Lat pull down 5 # and rows 10# x 10 each  01/04/23 THERAPEUTIC EXERCISE: to improve flexibility, strength and mobility.  Demonstration, verbal and tactile cues throughout for technique. UBE L1.0 x 8 min 94' each fwd & back) Lat pull down 5# x 20 using bar for R shoulder AAROM into overhead ROM on release R finger ladder flexion and scaption x 10 each Modified "cabinet reaches" from counter top to top of wooden box placed on counter: Flexion x 10 Flexion with 3 oz putty tub Scaption with 3 oz putty tub Yellow theraputty grip squeezes   01/02/23 THERAPEUTIC EXERCISE: to improve flexibility, strength and mobility.  Demonstration, verbal and tactile cues throughout for technique. UBE L1.0 x 6 min Bent over row + scap retraction x 10 1#; x 10 2# Bent over shld ext + scap retraction x 10 1#; x 10 2# Seated table slide AROM with 2# weight x 15 R shoulder Isometric step out R shld flexion 10x YTB   12/28/22 THERAPEUTIC EXERCISE: to improve flexibility, strength and mobility.  Demonstration, verbal and tactile cues throughout for technique. Pulleys - R shoulder flexion & scaption x 3 min each, within pain-free ROM Seated Swiffer R shoulder flexion and scaption 2 x 10 each Bent over B scap retraction & shoulder row 2 x 10 R 1#, L 2# Bent over B scap retraction & shoulder  extension 2 x 10 R 1#, L 2#  MANUAL THERAPY: to decrease muscle spasm and pain and improve mobility  ROM measurements taken KT tape for ant support of R shoulder - 2 I strips; one ant deltoid to upper trap applied with post glide of humeral head, one strip mid deltoid to UT   PATIENT EDUCATION: Education details: HEP update - yellow theraputty grip strengthening Person educated: Patient Education method: Explanation, Demonstration, and MedBridgeGO access information provided to pt Education comprehension: verbalized understanding and returned demonstration  HOME EXERCISE  PROGRAM: Access Code: [email or web address] URL: [email or web address] Date: 12/23/2022 Prepared by: Glenetta Hew  Exercises - Seated Bilateral Shoulder Flexion Towel Slide at Table Top  - 1 x daily - 7 x weekly - 1 sets - 10 reps - Seated Shoulder Towel Slides Scaption at Table  - 1 x daily - 7 x weekly - 1 sets - 10 reps - Seated Scapular Retraction  - 1 x daily - 7 x weekly - 1-3 sets - 10 reps - 2-3 sec hold - Seated Scapular Retraction with External Rotation  - 1 x daily - 7 x weekly - 1 sets - 10 reps - Shoulder Flexion Wall Slide with Towel  - 1 x daily - 7 x weekly - 2 sets - 5 reps - Isometric Shoulder Flexion at Wall  - 1 x daily - 7 x weekly - 1 sets - 5 reps - 10 sec hold - Standing Isometric Shoulder Internal Rotation at Doorway  - 1 x daily - 7 x weekly - 1 sets - 5 reps - 10 sec hold - Standing Isometric Shoulder External Rotation with Doorway (Mirrored)  - 1 x daily - 7 x weekly - 1 sets - 5 reps - 10 sec  hold - Standing Bilateral Low Shoulder Row with Anchored Resistance  - 1 x daily - 7 x weekly - 2 sets - 10 reps - 5 sec hold - Scapular Retraction with Resistance Advanced  - 1 x daily - 7 x weekly - 2 sets - 10 reps - 5 sec hold - Shoulder external rotation  - 1 x daily - 3 x weekly - 2 sets - 5 reps - Supine Shoulder Flexion Overhead with Dowel (Mirrored)  - 1 x daily - 7 x weekly - 2 sets - 5  reps - 3 sec hold - Seated Bent Over Shoulder Row with Dumbbells  - 1 x daily - 7 x weekly - 2 sets - 10 reps - 3 sec hold - Seated Shoulder Extension  - 1 x daily - 7 x weekly - 2 sets - 10 reps - 3 sec hold   ASSESSMENT:  CLINICAL IMPRESSION: Ananya reports increasing functional use of R shoulder with activities such as reaching fwd for the sink faucet. We did review how to change her water facet w/o straining R shoulder. Continued with strengthening postural muscles as well as deltoid isometrics to improve strength. Improved ROM in R UE but shoulder pressing motion rather than traditional should flex or abd. Maylea will benefit from continued skilled PT to address ongoing ROM and strength deficits to improve mobility and activity tolerance with decreased pain interference.   OBJECTIVE IMPAIRMENTS: decreased ROM, decreased strength, increased muscle spasms, impaired flexibility, impaired UE functional use, postural dysfunction, and pain.   ACTIVITY LIMITATIONS: carrying, lifting, sleeping, bathing, toileting, dressing, self feeding, reach over head, and hygiene/grooming  PARTICIPATION LIMITATIONS: meal prep, cleaning, laundry, driving, and yard work  PERSONAL FACTORS: Time since onset of injury/illness/exacerbation and 3+ comorbidities: Myasthenia gravis, h/o radiation for mediastinal mass, HTN, OP  are also affecting patient's functional outcome.   REHAB POTENTIAL: Good  CLINICAL DECISION MAKING: Evolving/moderate complexity  EVALUATION COMPLEXITY: Moderate   GOALS: Goals reviewed with patient? Yes  SHORT TERM GOALS: Target date: 11/07/2022, extended to 12/26/2022  Patient will be independent with initial HEP.  Baseline:  Goal status: MET - 10/26/22  2.  Patient able to sleep without waking from shoulder pain  Baseline:  Goal status: MET - 12/14/22   LONG TERM GOALS:  Target date: 12/05/2022, extended to 01/23/2023  Patient will be independent with advanced/ongoing HEP to improve  outcomes and carryover.  Baseline:  Goal status: IN PROGRESS - 12/23/22 - met for current HEP  2.  Patient will report 75% improvement in R shoulder pain to improve QOL.  Baseline:  Goal status: IN PROGRESS - 12/23/22 - Pt reports minimal to no pain most of the time but still will have occasional "catching" pain with some motions  3. Improved R grip strength by 5-10#  Baseline: 10# Goal status: IN PROGRESS - 12/28/22 - 11#  4.  Patient to improve R shoulder AROM to [facility] without pain provocation to allow for increased ease of ADLs.  Baseline:  Goal status: IN PROGRESS - 12/28/22 - R shoulder AROM still significantly limited in all planes  5.  Patient will demonstrate improved functional R UE strength as demonstrated her ability to use her arm to perform grooming and hygiene. Baseline:  Goal status: IN PROGRESS - 01/04/23 - Pt remains very limited R UE functional use with most daily ADLs, but notes improving ability to do things like reach for faucets or clothes in the closet  6  Patient will report 58/100 on Quick Dash to demonstrate improved functional ability.  Baseline: 68.2 / 100 = 68.2 % Goal status: IN PROGRESS  - 11/28/22 - 63.6 / 100 = 63.6 %   PLAN:  PT FREQUENCY: 2x/week  PT DURATION: 8 weeks  PLANNED INTERVENTIONS: Therapeutic exercises, Therapeutic activity, Neuromuscular re-education, Patient/Family education, Self Care, Joint mobilization, Aquatic Therapy, Dry Needling, Electrical stimulation, Spinal mobilization, Cryotherapy, Moist heat, Taping, Ionotophoresis 4mg /ml Dexamethasone, and Manual therapy  PLAN FOR NEXT SESSION: Shoulder/RC strength and shoulder AAROM; Scapular stabilization!; Review and update HEP as needed; possibly repeat Ktape application   Darleene Cleaver, PTA  01/11/2023, 12:37 PM

## 2023-01-14 ENCOUNTER — Other Ambulatory Visit: Payer: Self-pay | Admitting: Family Medicine

## 2023-01-14 DIAGNOSIS — I10 Essential (primary) hypertension: Secondary | ICD-10-CM

## 2023-01-16 ENCOUNTER — Other Ambulatory Visit: Payer: Self-pay

## 2023-01-16 ENCOUNTER — Telehealth: Payer: Self-pay | Admitting: Family Medicine

## 2023-01-16 ENCOUNTER — Encounter: Payer: Self-pay | Admitting: Physical Therapy

## 2023-01-16 ENCOUNTER — Telehealth: Payer: Self-pay | Admitting: Neurology

## 2023-01-16 ENCOUNTER — Ambulatory Visit: Payer: Medicare HMO | Admitting: Physical Therapy

## 2023-01-16 DIAGNOSIS — G8929 Other chronic pain: Secondary | ICD-10-CM | POA: Diagnosis not present

## 2023-01-16 DIAGNOSIS — S46211A Strain of muscle, fascia and tendon of other parts of biceps, right arm, initial encounter: Secondary | ICD-10-CM | POA: Diagnosis not present

## 2023-01-16 DIAGNOSIS — M25511 Pain in right shoulder: Secondary | ICD-10-CM | POA: Diagnosis not present

## 2023-01-16 DIAGNOSIS — M25611 Stiffness of right shoulder, not elsewhere classified: Secondary | ICD-10-CM

## 2023-01-16 DIAGNOSIS — R252 Cramp and spasm: Secondary | ICD-10-CM

## 2023-01-16 DIAGNOSIS — M6281 Muscle weakness (generalized): Secondary | ICD-10-CM

## 2023-01-16 DIAGNOSIS — I10 Essential (primary) hypertension: Secondary | ICD-10-CM

## 2023-01-16 MED ORDER — LISINOPRIL 40 MG PO TABS: 40.0000 mg | ORAL_TABLET | Freq: Every day | ORAL | 3 refills | Status: DC

## 2023-01-16 NOTE — Telephone Encounter (Signed)
Patient called and is needing a med refill on lisinopril (ZESTRIL) 40 MG tablet  Please send to [facility] in [place].  Patient is almost out of medication.

## 2023-01-16 NOTE — Telephone Encounter (Signed)
Refill sent.

## 2023-01-16 NOTE — Telephone Encounter (Signed)
Called and LVM for pt regarding this information and left office number for her to call back to confirm that she did in fact get her medication today and that she did receive this message.

## 2023-01-16 NOTE — Telephone Encounter (Signed)
Pt is requesting a refill for pyridostigmine (MESTINON) 60 MG tablet .  Pharmacy: [facility] [phone] [facility]

## 2023-01-16 NOTE — Telephone Encounter (Signed)
It was just dispensed today they are requesting refill to soon

## 2023-01-17 ENCOUNTER — Telehealth: Payer: Self-pay | Admitting: Neurology

## 2023-01-17 NOTE — Telephone Encounter (Signed)
Laboratory from [facility] January 10, 2023, IgA 162, GFR 79

## 2023-01-19 ENCOUNTER — Ambulatory Visit: Payer: Medicare HMO

## 2023-01-19 DIAGNOSIS — M25511 Pain in right shoulder: Secondary | ICD-10-CM | POA: Diagnosis not present

## 2023-01-19 DIAGNOSIS — R252 Cramp and spasm: Secondary | ICD-10-CM

## 2023-01-19 DIAGNOSIS — M25611 Stiffness of right shoulder, not elsewhere classified: Secondary | ICD-10-CM | POA: Diagnosis not present

## 2023-01-19 DIAGNOSIS — S46211A Strain of muscle, fascia and tendon of other parts of biceps, right arm, initial encounter: Secondary | ICD-10-CM | POA: Diagnosis not present

## 2023-01-19 DIAGNOSIS — M6281 Muscle weakness (generalized): Secondary | ICD-10-CM | POA: Diagnosis not present

## 2023-01-19 DIAGNOSIS — G8929 Other chronic pain: Secondary | ICD-10-CM | POA: Diagnosis not present

## 2023-01-19 NOTE — Therapy (Signed)
OUTPATIENT PHYSICAL THERAPY TREATMENT       Patient Name: Berenda Garry MRN: [id number] DOB:1945-03-30, 77 y.o., female Today's Date: 01/19/2023  END OF SESSION:  PT End of Session - 01/19/23 1235     Visit Number 18    Date for PT Re-Evaluation 01/23/23    Authorization Type [employer] MCR    Progress Note Due on Visit 17    PT Start Time 1149    PT Stop Time 1232    PT Time Calculation (min) 43 min    Activity Tolerance Patient tolerated treatment well    Behavior During Therapy WFL for tasks assessed/performed                             Past Medical History:  Diagnosis Date   Breast CA (HCC)    History of radiation therapy    Chest 06/09/2022 - 07/21/2022 - Dr. 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   Myasthenic syndrome (HCC) 04/12/2022   CVA (cerebral vascular accident) (HCC) 03/28/2022   HTN (hypertension) 03/28/2022   Chest mass 03/28/2022   Right knee injury 02/14/2017   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 10/16/2012   Osteoporosis 10/12/2012    PCP: Pearline Cables, MD  REFERRING PROVIDER: Cristie Hem, PA-C  REFERRING DIAG: [phone] (ICD-10-CM) - Chronic right shoulder pain  THERAPY DIAG:  Acute pain of right shoulder  Stiffness of right shoulder, not elsewhere classified  Muscle weakness (generalized)  Cramp and  spasm  RATIONALE FOR EVALUATION AND TREATMENT: Rehabilitation  ONSET DATE: 06/20/22  NEXT MD VISIT: TBD   SUBJECTIVE:                                                                                                                                                                                      SUBJECTIVE STATEMENT: Pt notes continued improvement, had some discomfort last night but after adjusting her position it was fine.  EVAL:  I had so much pain last  night I had to take a Tramadol. I can't get a blouse out of the closet. Hurts to reach out. Can't reach behind head and can't lift arm without support of other arm. A little better since injection last week. Just recently went off prednisone which she had for IVIG therapy. Diagnosed with Myasthenia gravis in January of this year (associated with the mass). 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 Tore biceps when she was changing the filter on her water filtering system, heard pop. She uses cane intermittently due to balance issues from Myasthenia gravis and prednisone.  Hand dominance: Right  PAIN:  Are you having pain? No  PERTINENT HISTORY: MG, h/o radiation (March and May 2024) for mediastinal mass, HTN, OP  PRECAUTIONS: None  RED FLAGS: None   WEIGHT BEARING RESTRICTIONS: No  FALLS:  Has patient fallen in last 6 months? No  LIVING ENVIRONMENT: Lives with: lives alone Lives in: House/apartment Stairs: No Has following equipment at home: Single point cane  OCCUPATION: retired  PLOF: Independent with household mobility with device  PATIENT GOALS:full function of her R arm   OBJECTIVE:   DIAGNOSTIC FINDINGS:  [place]: Limited musculoskeletal ultrasound of the right upper extremity, right  shoulder was performed.  Short axis evaluation of the bicipital groove  shows no cortical irregularity of the humeral head or groove of the  shoulder.  Within the bicipital groove there is a hypoechoic space with  absent  visualization of the proximal head of the biceps tendon, indicative  of likely proximal bicep tendon tear.  Short and long axis evaluation of  the biceps tendon does show retraction into the proximal aspect of the  bicep musculature.  Evaluation of the [facility] joint shows at least moderate  arthritic changes without significant bursal distention.   PATIENT SURVEYS:  Quick Dash 68.2 / 100 = 68.2 % ; 11/28/22 - 63.6 / 100 = 63.6 %  COGNITION: Overall cognitive status: Within functional limits for tasks assessed     SENSATION: WFL  POSTURE: R shoulder anterior, depressed R shoulder, scapula and R ilia  UPPER EXTREMITY ROM:   A/PROM Right eval R 11/28/22 L 11/28/22 R 12/28/22 R 01/11/23  Shoulder flexion 20/157 passively with clunk A: 33 with excessive substitution  P: 132 150 A: 33 very difficult P: 147  AROM- 120   Shoulder extension WNL 48 68    Shoulder abduction 58/177 A: 59 P: 160 155 A: 56* P: full AROM- 110  Shoulder adduction       Shoulder internal rotation 55/62 A: 75 P: 65 85 A: 50 with blocking of humeral head P: 68 FIR- to L4-L5  Shoulder external rotation 65/85 very uncomfortable A: 61 P: 85 90 A: 55 P: 68*    Elbow flexion full      Elbow extension full      (Blank rows = not tested)  UPPER EXTREMITY MMT:  MMT Right eval R 11/28/22 L 11/28/22  Shoulder flexion 2- 2- 5  Shoulder extension 4+ 4 4  Shoulder abduction 4-* with elbow bent 2- 4+  Shoulder adduction     Shoulder internal rotation 4- 4- 4+  Shoulder external rotation 5 4- 4  Middle trapezius     Lower trapezius     Elbow flexion 4* 4 5  Elbow extension 5 mild pain  4 4+  Wrist pronation     Wrist supination     Grip strength (lbs) 10 6 11   (Blank rows = not tested)   PALPATION:  Marked UT, pecs, ant shoulder, IS  Decreased stability in ant R shoulder joint    TODAY'S TREATMENT:                                                                                                                                          DATE:  01/19/23 THERAPEUTIC EXERCISE: to improve flexibility, strength and mobility.  Demonstration, verbal and tactile cues throughout for technique. UBE L2.0 x 8 min (4' each fwd & back) Seated lat pulls 15# 2x10 Seated rows 15# 2x10 low grips S/L R shoulder ER x 10 with lacrosse ball; x 10 w/o weight S/L R shoulder abduction x 10   STM to R LS,UT  01/16/23 THERAPEUTIC EXERCISE: to improve flexibility, strength and mobility.  Demonstration, verbal and tactile cues throughout for technique. UBE L1.5 x 8 min (4' each fwd & back) R shoulder flexion cabinet reaches to bottom shelf of overhead cabinet x 3 before fatiguing  Modified "cabinet reaches" from counter top to top of wooden box placed on counter: Flexion with lacrosse ball x 15 Scaption with lacrosse ball x 12 R shoulder ball on wall shoulder CW/CCW circles at 90 flexion 2 x 10 each R shoulder ball on wall shoulder CW/CCW circles at 90 scaption x 10 each B shoulder blue/green ball roll-up into overhead flexion 2 x 5 R shoulder rhythmic stabilization at 90 flexion and scaption with PT providing perturbations to ball on wall x 30" each Serratus roll-ups on purple FR on wall x 5 - switched to: Serratus slide with isometric horiz ABD & ER in pillowcase on wall x 10 Lat pull down 10# 2 x 10 using bar for R shoulder AAROM into overhead ROM on release   01/11/23 THERAPEUTIC EXERCISE: to improve flexibility, strength and mobility.  Demonstration, verbal and tactile cues throughout for technique. UBE L1.0 x 6 min  4 min fwd & 2 back) Finger ladder R shld flexion 2 x 10; R shld abduction 2 x 10  Rows 10# low grips 3x10 Shoulder isometric step outs with YTB: flexion 5x, abduction 5x Lat pull 10# x 20    01/09/23 THERAPEUTIC EXERCISE: to improve flexibility, strength and mobility.  Demonstration, verbal and tactile cues throughout for technique. UBE L1.0 x 6 min  4 min fwd & 2 back) Finger ladder R shoulder: -   flexion 2x5 - abduction x 10 Standing R shoulder table slide with 1# weight x 15; scaption slide x 15 Lat pull down 5 # and rows 10# x 10 each   PATIENT EDUCATION: Education details: HEP update - serratus wall slides Person educated: Patient Education method: Explanation, Demonstration, and Verbal cues Education comprehension: verbalized understanding and returned demonstration  HOME EXERCISE PROGRAM: Access Code: [email or web address] URL: [email or web address] Date: 01/16/2023 Prepared by: Glenetta Hew  Exercises - Seated Shoulder Flexion Towel Slide at Table Top  - 2-3 x daily - 7 x weekly -  2 sets - 10 reps - 3 sec  hold - Seated Shoulder Scaption Slide at Table Top with Forearm in Neutral  - 2-3 x daily - 7 x weekly - 2 sets - 10 reps - 3 sec hold - Seated Shoulder Setting  - 2-3 x daily - 7 x weekly - 2 sets - 10 reps - 3 sec hold - Isometric Shoulder Flexion at Wall (Mirrored)  - 1-2 x daily - 7 x weekly - 2 sets - 5 reps - 5 sec hold - Isometric Shoulder Abduction at Wall (Mirrored)  - 1-2 x daily - 7 x weekly - 2 sets - 5 reps - 5 sec hold - Isometric Shoulder Extension at Wall (Mirrored)  - 1-2 x daily - 7 x weekly - 2 sets - 5 reps - 5 sec hold - Gentle Vertical Shoulder Pendulum  - 2-3 x daily - 7 x weekly - 2 sets - 10 reps - Gentle Horizontal Shoulder Pendulum  - 2-3 x daily - 7 x weekly - 2 sets - 10 reps - Circular Shoulder Pendulum with Table Support  - 2-3 x daily - 7 x weekly - 2 sets - 10 reps - Supine Shoulder Flexion AAROM with Dowel  - 1-2 x daily - 7 x weekly - 2 sets - 10 reps - 3 sec hold - Supine Shoulder Scaption with Dowel  - 1-2 x daily - 7 x weekly - 2 sets - 10 reps - 3 sec hold - Supine Shoulder External Rotation with Dowel at 20-30 Degrees of Abduction  - 1-2 x daily - 7 x weekly - 2 sets - 10 reps - 3 sec hold - Isometric Shoulder Internal Rotation (Mirrored)  - 1 x daily - 7 x weekly - 2 sets - 5 reps - 5 sec hold - Isometric Shoulder External  Rotation (Mirrored)  - 1 x daily - 7 x weekly - 2 sets - 5 reps - 5 sec hold - Serratus Activation at Wall  - 1 x daily - 7 x weekly - 2 sets - 10 reps - 3 sec hold   ASSESSMENT:  CLINICAL IMPRESSION: Tirsa is pleased with her improving R shoulder motion and function and feels like her shoulder is getting stronger. Continued strengthening R shoulder as tolerated. Added sidelying strengthening for R shoulder ER and abduction. PT still shows difficulty with resisted ER but good demo of the motion in sidelying. Coming up on recert pt wanting to continue with PT but also has sx scheduled for 11/20.Ellen will benefit from continued skilled PT to address ongoing ROM and strength deficits to improve mobility and activity tolerance with decreased pain interference.   OBJECTIVE IMPAIRMENTS: decreased ROM, decreased strength, increased muscle spasms, impaired flexibility, impaired UE functional use, postural dysfunction, and pain.   ACTIVITY LIMITATIONS: carrying, lifting, sleeping, bathing, toileting, dressing, self feeding, reach over head, and hygiene/grooming  PARTICIPATION LIMITATIONS: meal prep, cleaning, laundry, driving, and yard work  PERSONAL FACTORS: Time since onset of injury/illness/exacerbation and 3+ comorbidities: Myasthenia gravis, h/o radiation for mediastinal mass, HTN, OP  are also affecting patient's functional outcome.   REHAB POTENTIAL: Good  CLINICAL DECISION MAKING: Evolving/moderate complexity  EVALUATION COMPLEXITY: Moderate   GOALS: Goals reviewed with patient? Yes  SHORT TERM GOALS: Target date: 11/07/2022, extended to 12/26/2022  Patient will be independent with initial HEP.  Baseline:  Goal status: MET - 10/26/22  2.  Patient able to sleep without waking from shoulder pain  Baseline:  Goal status: MET -  12/14/22   LONG TERM GOALS: Target date: 12/05/2022, extended to 01/23/2023  Patient will be independent with advanced/ongoing HEP to improve outcomes and carryover.   Baseline:  Goal status: IN PROGRESS - 01/16/23 - met for current HEP, updated today  2.  Patient will report 75% improvement in R shoulder pain to improve QOL.  Baseline:  Goal status: IN PROGRESS - 12/23/22 - Pt reports minimal to no pain most of the time but still will have occasional "catching" pain with some motions  3. Improved R grip strength by 5-10#  Baseline: 10# Goal status: IN PROGRESS - 12/28/22 - 11#  4.  Patient to improve R shoulder AROM to [facility] without pain provocation to allow for increased ease of ADLs.  Baseline:  Goal status: IN PROGRESS - 01/11/23 - R shoulder AROM progressing functionally  5.  Patient will demonstrate improved functional R UE strength as demonstrated her ability to use her arm to perform grooming and hygiene. Baseline:  Goal status: IN PROGRESS - 01/04/23 - Pt remains very limited R UE functional use with most daily ADLs, but notes improving ability to do things like reach for faucets or clothes in the closet  6  Patient will report 58/100 on Quick Dash to demonstrate improved functional ability.  Baseline: 68.2 / 100 = 68.2 % Goal status: IN PROGRESS - 11/28/22 - 63.6 / 100 = 63.6 %   PLAN:  PT FREQUENCY: 2x/week  PT DURATION: 8 weeks  PLANNED INTERVENTIONS: Therapeutic exercises, Therapeutic activity, Neuromuscular re-education, Patient/Family education, Self Care, Joint mobilization, Aquatic Therapy, Dry Needling, Electrical stimulation, Spinal mobilization, Cryotherapy, Moist heat, Taping, Ionotophoresis 4mg /ml Dexamethasone, and Manual therapy  PLAN FOR NEXT SESSION: Shoulder/RC strength and shoulder AAROM; Scapular stabilization!; Review and update HEP as needed; possibly repeat [place] application   Darleene Cleaver, PTA  01/19/2023, 12:35 PM

## 2023-01-23 ENCOUNTER — Encounter: Payer: Self-pay | Admitting: Physical Therapy

## 2023-01-23 ENCOUNTER — Ambulatory Visit: Payer: Medicare HMO | Attending: Physician Assistant | Admitting: Physical Therapy

## 2023-01-23 DIAGNOSIS — M25511 Pain in right shoulder: Secondary | ICD-10-CM | POA: Insufficient documentation

## 2023-01-23 DIAGNOSIS — M6281 Muscle weakness (generalized): Secondary | ICD-10-CM | POA: Diagnosis not present

## 2023-01-23 DIAGNOSIS — R252 Cramp and spasm: Secondary | ICD-10-CM | POA: Insufficient documentation

## 2023-01-23 DIAGNOSIS — M25611 Stiffness of right shoulder, not elsewhere classified: Secondary | ICD-10-CM | POA: Diagnosis not present

## 2023-01-23 NOTE — Therapy (Addendum)
OUTPATIENT PHYSICAL THERAPY TREATMENT / RE-CERTIFICATION    Progress Note  Reporting Period 01/16/2023 to 01/23/2023   See note below for Objective Data and Assessment of Progress/Goals.    Patient Name: Faith Massey MRN: [id number] DOB:Dec 27, 1945, 77 y.o., female Today's Date: 01/23/2023  END OF SESSION:  PT End of Session - 01/23/23 1401     Visit Number 19    Date for PT Re-Evaluation 03/20/23    Authorization Type [employer] MCR    Progress Note Due on Visit 29   Recert/PN on visit #19 - 01/23/23   PT Start Time 1401    PT Stop Time 1452    PT Time Calculation (min) 51 min    Activity Tolerance Patient tolerated treatment well    Behavior During Therapy [facility] for tasks assessed/performed                              Past Medical History:  Diagnosis Date   Breast CA (HCC)    History of radiation therapy    Chest 06/09/2022 - 07/21/2022 - Dr. 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   Myasthenic syndrome (HCC) 04/12/2022   CVA (cerebral vascular accident) (HCC) 03/28/2022   HTN (hypertension) 03/28/2022   Chest mass 03/28/2022   Right knee injury 02/14/2017   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 Typically pain is still worst at night and often difficult to resettle, requiring her to take pain meds.  EVAL:  I had so much pain last night I had to take a Tramadol. I can't get a blouse out of the closet. Hurts to reach out. Can't reach behind head and can't lift arm without support of other arm. A little better since injection last week. Just recently went off prednisone which she had for IVIG therapy. Diagnosed with Myasthenia gravis in January of this year (associated with the mass). 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 Tore biceps when she was changing the filter on her water filtering system, heard pop. She uses cane intermittently due to balance issues from Myasthenia gravis and prednisone.  Hand dominance: Right  PAIN:  Are you having pain? Yes: NPRS scale: 3/10, at night up to 6-7/10 Pain location: R ant/post upper shoulder Pain description: aching Aggravating factors: sleeping position, reaching away from body esp OH Relieving factors: Tylenol, horse liniment, massager device  PERTINENT HISTORY: MG, h/o radiation (March and May 2024) for mediastinal mass, HTN, OP  PRECAUTIONS: None  RED FLAGS: None   WEIGHT BEARING RESTRICTIONS: No  FALLS:  Has patient fallen in last 6 months? No  LIVING ENVIRONMENT: Lives with: lives alone Lives in: House/apartment Stairs: No Has following equipment at home: Single point cane  OCCUPATION: retired  PLOF:  Independent with household mobility with device  PATIENT GOALS:full function of her R arm   OBJECTIVE:   DIAGNOSTIC FINDINGS:  [place]: Limited musculoskeletal ultrasound of the right upper extremity, right  shoulder was performed.  Short axis evaluation of the bicipital groove  shows no cortical irregularity of the humeral head or groove of the  shoulder.  Within the bicipital groove there is a hypoechoic space with  absent visualization of the proximal head of the biceps tendon, indicative  of likely proximal bicep tendon tear.  Short and long axis evaluation of  the biceps tendon does show retraction into the proximal aspect of the  bicep musculature.  Evaluation of the [facility] joint shows at least moderate  arthritic changes without significant bursal distention.   PATIENT SURVEYS:  Quick Dash 68.2 / 100 = 68.2% ; 11/28/22 - 63.6 / 100 = 63.6%; 01/23/23 - 50.0 / 100 = 50.0% (LTG #6 met)   COGNITION: Overall cognitive status: Within functional limits for tasks assessed     SENSATION: WFL  POSTURE: R shoulder anterior, depressed R shoulder, scapula and R ilia  UPPER EXTREMITY ROM:   A/PROM Right eval R 11/28/22 L 11/28/22 R 12/28/22 R 01/11/23 R 01/23/23  Shoulder flexion 20/157 passively with clunk A: 33 with excessive substitution  P: 132 150 A: 33 very difficult P: 147  AROM:  120  A: 97 P: 145  Shoulder extension WNL 48 68   A: 55  Shoulder abduction 58/177 A: 59 P: 160 155 A: 56* P: full AROM: 110 A: 89 P: 108  Shoulder adduction        Shoulder internal rotation 55/62 A: 75 P: 65 85 A: 50 with blocking of humeral head P: 68 FIR- to L4-L5 A: FIR to L3   Shoulder external rotation 65/85 very uncomfortable A: 61 P: 85 90 A: 55 P: 68*   A: 84  Elbow flexion full       Elbow extension full       (Blank rows = not tested)  UPPER EXTREMITY MMT:  MMT Right eval R 11/28/22  L 11/28/22 R 01/23/23 L 01/23/23  Shoulder flexion 2- 2- 5 2+ 5  Shoulder extension 4+ 4 4 4 4   Shoulder  abduction 4-* with elbow bent 2- 4+ 2+ 5  Shoulder adduction       Shoulder internal rotation 4- 4- 4+ 4 5  Shoulder external rotation 5 4- 4 4 4+  Middle trapezius       Lower trapezius       Elbow flexion 4* 4 5 4+ 5  Elbow extension 5 mild pain  4 4+ 4+ 4+  Wrist pronation       Wrist supination       Grip strength (lbs) 10 6 11 13 17   (Blank rows = not tested)   PALPATION:  Marked UT, pecs, ant shoulder, IS  Decreased stability in ant R shoulder joint    TODAY'S TREATMENT:                                                                                                                                         DATE:   01/23/23 - Recert THERAPEUTIC EXERCISE: to improve flexibility, strength and mobility.  Demonstration, verbal and tactile cues throughout for technique. UBE L2.0 x 8 min (4' each fwd & back)  THERAPEUTIC ACTIVITIES: UE ROM and MMT assessment QuickDASH: 50.0 / 100 = 50.0 % (LTG #6 met) Recommendations for ongoing PT including accommodations for upcoming surgery for partial colectomy on 02/08/2023  MANUAL THERAPY: To promote normalized muscle tension, improved joint mobility, increased ROM, and reduced pain.  R shoulder P/AAROM with PT facilitating coordinated scapular movement and inhibiting abnormal accessory movement   01/19/23 THERAPEUTIC EXERCISE: to improve flexibility, strength and mobility.  Demonstration, verbal and tactile cues throughout for technique. UBE L2.0 x 8 min (4' each fwd & back) Seated lat pulls 15# 2x10 Seated rows 15# 2x10 low grips S/L R shoulder ER x 10 with lacrosse ball; x 10 w/o weight S/L R shoulder abduction x 10   STM to R LS,UT   01/16/23 - PN THERAPEUTIC EXERCISE: to improve flexibility, strength and mobility.  Demonstration, verbal and tactile cues throughout for technique. UBE L1.5 x 8 min (4' each fwd & back) R shoulder flexion cabinet reaches to bottom shelf of overhead cabinet x 3 before fatiguing  Modified "cabinet  reaches" from counter top to top of wooden box placed on counter: Flexion with lacrosse ball x 15 Scaption with lacrosse ball x 12 R shoulder ball on wall shoulder CW/CCW circles at 90 flexion 2 x 10 each R shoulder ball on wall shoulder CW/CCW circles at 90 scaption x 10 each B shoulder blue/green ball roll-up into overhead flexion 2 x 5 R shoulder rhythmic stabilization at 90 flexion and scaption with PT providing perturbations to ball on wall x 30" each Serratus roll-ups on purple FR on wall x 5 - switched to: Serratus slide with isometric horiz ABD &  ER in pillowcase on wall x 10 Lat pull down 10# 2 x 10 using bar for R shoulder AAROM into overhead ROM on release   PATIENT EDUCATION: Education details: progress with PT and ongoing PT POC Person educated: Patient Education method: Explanation Education comprehension: verbalized understanding  HOME EXERCISE PROGRAM: Access Code: [email or web address] URL: [email or web address] Date: 01/16/2023 Prepared by: Glenetta Hew  Exercises - Seated Shoulder Flexion Towel Slide at Table Top  - 2-3 x daily - 7 x weekly - 2 sets - 10 reps - 3 sec  hold - Seated Shoulder Scaption Slide at Table Top with Forearm in Neutral  - 2-3 x daily - 7 x weekly - 2 sets - 10 reps - 3 sec hold - Seated Shoulder Setting  - 2-3 x daily - 7 x weekly - 2 sets - 10 reps - 3 sec hold - Isometric Shoulder Flexion at Wall (Mirrored)  - 1-2 x daily - 7 x weekly - 2 sets - 5 reps - 5 sec hold - Isometric Shoulder Abduction at Wall (Mirrored)  - 1-2 x daily - 7 x weekly - 2 sets - 5 reps - 5 sec hold - Isometric Shoulder Extension at Wall (Mirrored)  - 1-2 x daily - 7 x weekly - 2 sets - 5 reps - 5 sec hold - Gentle Vertical Shoulder Pendulum  - 2-3 x daily - 7 x weekly - 2 sets - 10 reps - Gentle Horizontal Shoulder Pendulum  - 2-3 x daily - 7 x weekly - 2 sets - 10 reps - Circular Shoulder Pendulum with Table Support  - 2-3 x daily - 7 x weekly - 2 sets - 10  reps - Supine Shoulder Flexion AAROM with Dowel  - 1-2 x daily - 7 x weekly - 2 sets - 10 reps - 3 sec hold - Supine Shoulder Scaption with Dowel  - 1-2 x daily - 7 x weekly - 2 sets - 10 reps - 3 sec hold - Supine Shoulder External Rotation with Dowel at 20-30 Degrees of Abduction  - 1-2 x daily - 7 x weekly - 2 sets - 10 reps - 3 sec hold - Isometric Shoulder Internal Rotation (Mirrored)  - 1 x daily - 7 x weekly - 2 sets - 5 reps - 5 sec hold - Isometric Shoulder External Rotation (Mirrored)  - 1 x daily - 7 x weekly - 2 sets - 5 reps - 5 sec hold - Serratus Activation at Wall  - 1 x daily - 7 x weekly - 2 sets - 10 reps - 3 sec hold   ASSESSMENT:  CLINICAL IMPRESSION: Ltanya continues to note improving functional use of her R shoulder with activity such as reaching for faucet or to her clothes hanging in the closet, however she continues to demonstrate frequent substitution and accessory motion in order to reach into overhead planes.  She notes pain overall improved by ~50%, although pain still wakes her up at night on occasion and will sometimes catch her off guard with reaching in certain motions.  R shoulder AROM is improving overall but still fluctuates related to fatigue and pain.  Gains noted in overall B UE strength including B grip strength.  Her QuickDASH has improved to 50% disability, meeting and exceeding LTG #6.  Wm is progressing well toward her remaining PT goals and will benefit from recert for continued skilled PT 2x/wk for up to 8 weeks to address ongoing ROM and strength deficits to improve mobility and  activity tolerance with decreased pain interference.  During this window she will likely need to take a break from PT as she will be having surgery for laparoscopic partial colectomy on 02/08/2023.  OBJECTIVE IMPAIRMENTS: decreased activity tolerance, decreased coordination, decreased ROM, decreased strength, increased fascial restrictions, impaired perceived functional ability,  increased muscle spasms, impaired flexibility, impaired UE functional use, improper body mechanics, postural dysfunction, and pain.   ACTIVITY LIMITATIONS: carrying, lifting, sleeping, bathing, toileting, dressing, self feeding, reach over head, and hygiene/grooming  PARTICIPATION LIMITATIONS: meal prep, cleaning, laundry, driving, shopping, community activity, and yard work  PERSONAL FACTORS: Time since onset of injury/illness/exacerbation and 3+ comorbidities: Myasthenia gravis, h/o radiation for mediastinal mass, HTN, OP  are also affecting patient's functional outcome.   REHAB POTENTIAL: Good  CLINICAL DECISION MAKING: Evolving/moderate complexity  EVALUATION COMPLEXITY: Moderate   GOALS: Goals reviewed with patient? Yes  SHORT TERM GOALS: Target date: 11/07/2022, extended to 12/26/2022  Patient will be independent with initial HEP.  Baseline:  Goal status: MET - 10/26/22  2.  Patient able to sleep without waking from shoulder pain  Baseline:  Goal status: MET - 12/14/22   LONG TERM GOALS: Target date: 12/05/2022, extended to 01/23/2023, extended to 03/20/2023   Patient will be independent with advanced/ongoing HEP to improve outcomes and carryover.  Baseline:  Goal status: IN PROGRESS - 01/23/23 - met for current HEP  2.  Patient will report 75% improvement in R shoulder pain to improve QOL.  Baseline:  Goal status: IN PROGRESS - 01/23/23 - Pt reports ~50% improvement in pain but still will have occasional "catching" pain with some motions and/or discomfort with sleep   3. Improved R grip strength by 5-10#  Baseline: 10#; 12/28/22 - 11# Goal status: IN PROGRESS - 01/23/23 - 13#  4.  Patient to improve R shoulder AROM to [facility] without pain provocation to allow for increased ease of ADLs.  Baseline:  Goal status: IN PROGRESS - 01/23/23 - R shoulder AROM progressing functionally  5.  Patient will demonstrate improved functional R UE strength as demonstrated her ability to use her  arm to perform grooming and hygiene. Baseline:  Goal status: IN PROGRESS - 01/23/23 - Pt remains limited R UE functional use with most daily ADLs, but notes improving ability to do things like reach for faucets or clothes in the closet  6.  Patient will report 58/100 on Quick Dash to demonstrate improved functional ability.  Baseline: 68.2 / 100 = 68.2 %; 11/28/22 - 63.6 / 100 = 63.6 % Goal status: MET - 01/23/23 - 50.0 / 100 = 50.0 %   PLAN:  PT FREQUENCY: 2x/week  PT DURATION: 8 weeks  PLANNED INTERVENTIONS: Therapeutic exercises, Therapeutic activity, Neuromuscular re-education, Patient/Family education, Self Care, Joint mobilization, Aquatic Therapy, Dry Needling, Electrical stimulation, Spinal mobilization, Cryotherapy, Moist heat, Taping, Ionotophoresis 4mg /ml Dexamethasone, and Manual therapy  PLAN FOR NEXT SESSION: Shoulder/RC strength and shoulder AAROM; Scapular stabilization!; Review and update HEP as needed; possibly repeat [place] application   Marry Guan, PT  01/23/2023, 4:11 PM

## 2023-01-24 ENCOUNTER — Ambulatory Visit: Payer: Medicare HMO | Admitting: Gastroenterology

## 2023-01-26 DIAGNOSIS — G709 Myoneural disorder, unspecified: Secondary | ICD-10-CM | POA: Diagnosis not present

## 2023-01-26 DIAGNOSIS — G7001 Myasthenia gravis with (acute) exacerbation: Secondary | ICD-10-CM | POA: Diagnosis not present

## 2023-01-27 ENCOUNTER — Other Ambulatory Visit: Payer: Self-pay | Admitting: Family Medicine

## 2023-01-27 ENCOUNTER — Encounter: Payer: Self-pay | Admitting: Family Medicine

## 2023-01-27 DIAGNOSIS — G7001 Myasthenia gravis with (acute) exacerbation: Secondary | ICD-10-CM | POA: Diagnosis not present

## 2023-01-27 DIAGNOSIS — G709 Myoneural disorder, unspecified: Secondary | ICD-10-CM | POA: Diagnosis not present

## 2023-01-27 MED ORDER — CEPHALEXIN 500 MG PO CAPS: 500.0000 mg | ORAL_CAPSULE | Freq: Two times a day (BID) | ORAL | 0 refills | Status: DC

## 2023-01-27 NOTE — Telephone Encounter (Signed)
Okay for refill on this?

## 2023-01-30 ENCOUNTER — Ambulatory Visit: Payer: Medicare HMO | Admitting: Physical Therapy

## 2023-01-30 NOTE — Patient Instructions (Addendum)
SURGICAL WAITING ROOM VISITATION  Patients having surgery or a procedure may have no more than 2 support people in the waiting area - these visitors may rotate.    Children under the age of 45 must have an adult with them who is not the patient.   If the patient needs to stay at the hospital during part of their recovery, the visitor guidelines for inpatient rooms apply. Pre-op nurse will coordinate an appropriate time for 1 support person to accompany patient in pre-op.  This support person may not rotate.    Please refer to the [facility] website for the visitor guidelines for Inpatients (after your surgery is over and you are in a regular room).       Your procedure is scheduled on: 02-08-23   Report to [facility] Main Entrance    Report to admitting at       0615  AM   Call this number if you have problems the morning of surgery [phone]   FOLLOW A CLEAR LIQUID DIET THE DAY OF YOUR BOWEL PREP TO PREVENT DEHYDRATION             FOLLOW BOWEL PREP PER SURGEON ORDERS   You may have the following liquids until _0530 _____ AM DAY OF SURGERY   then nothing by mouth  Water Non-Citrus Juices (without pulp, NO RED-Apple, White grape, White cranberry) Black Coffee (NO MILK/CREAM OR CREAMERS, sugar ok)  Clear Tea (NO MILK/CREAM OR CREAMERS, sugar ok) regular and decaf                             Plain Jell-O (NO RED)                                           Fruit ices (not with fruit pulp, NO RED)                                     Popsicles (NO RED)                                                               Sports drinks like Gatorade (NO RED)              Drink 2 /G2 drinks AT 10:00 PM the night before surgery.        The day of surgery:  Drink ONE (1) Pre-Surgery  G2 at   0515  AM the morning of surgery. Drink in one sitting. Do not sip.  This drink was given to you during your hospital  pre-op appointment visit. Nothing else to drink after completing the   Pre-Surgery  G2    by 0530 am.          If you have questions, please contact your surgeon's office.   FOLLOW BOWEL PREP AND ANY ADDITIONAL PRE OP INSTRUCTIONS YOU RECEIVED FROM YOUR SURGEON'S OFFICE!!!     Oral Hygiene is also important to reduce your risk of infection.  Remember - BRUSH YOUR TEETH THE MORNING OF SURGERY WITH YOUR REGULAR TOOTHPASTE  DENTURES WILL BE REMOVED PRIOR TO SURGERY PLEASE DO NOT APPLY "Poly grip" OR ADHESIVES!!!   Do NOT smoke after Midnight   Stop all vitamins and herbal supplements 7 days before surgery.   Take these medicines the morning of surgery with A SIP OF WATER: Tramadol if needed, Pyridostigmine(mestinon), Clotrimazole, cephalexin                                  You may not have any metal on your body including hair pins, jewelry, and body piercing             Do not wear make-up, lotions, powders, perfumes/cologne, or deodorant  Do not wear nail polish including gel and S&S, artificial/acrylic nails, or any other type of covering on natural nails including finger and toenails. If you have artificial nails, gel coating, etc. that needs to be removed by a nail salon please have this removed prior to surgery or surgery may need to be canceled/ delayed if the surgeon/ anesthesia feels like they are unable to be safely monitored.   Do not shave  48 hours prior to surgery.                Do not bring valuables to the hospital. [facility] IS NOT             RESPONSIBLE   FOR VALUABLES.   Contacts, glasses, dentures or bridgework may not be worn into surgery.   Bring small overnight bag day of surgery.   DO NOT BRING YOUR HOME MEDICATIONS TO THE HOSPITAL. PHARMACY WILL DISPENSE MEDICATIONS LISTED ON YOUR MEDICATION LIST TO YOU DURING YOUR ADMISSION IN THE HOSPITAL!    Patients discharged on the day of surgery will not be allowed to drive home.  Someone NEEDS to stay with you for the first 24 hours after  anesthesia.   Special Instructions: Bring a copy of your healthcare power of attorney and living will documents the day of surgery if you haven't scanned them before.              Please read over the following fact sheets you were given: IF YOU HAVE QUESTIONS ABOUT YOUR PRE-OP INSTRUCTIONS PLEASE CALL [phone]    If you test positive for Covid or have been in contact with anyone that has tested positive in the last 10 days please notify you surgeon.    [place] - Preparing for Surgery Before surgery, you can play an important role.  Because skin is not sterile, your skin needs to be as free of germs as possible.  You can reduce the number of germs on your skin by washing with CHG (chlorahexidine gluconate) soap before surgery.  CHG is an antiseptic cleaner which kills germs and bonds with the skin to continue killing germs even after washing. Please DO NOT use if you have an allergy to CHG or antibacterial soaps.  If your skin becomes reddened/irritated stop using the CHG and inform your nurse when you arrive at Short Stay. Do not shave (including legs and underarms) for at least 48 hours prior to the first CHG shower.  You may shave your face/neck. Please follow these instructions carefully:  1.  Shower with CHG Soap the night before surgery and the  morning of Surgery.  2.  If you choose to wash your hair, wash  your hair first as usual with your  normal  shampoo.  3.  After you shampoo, rinse your hair and body thoroughly to remove the  shampoo.                           4.  Use CHG as you would any other liquid soap.  You can apply chg directly  to the skin and wash                       Gently with a scrungie or clean washcloth.  5.  Apply the CHG Soap to your body ONLY FROM THE NECK DOWN.   Do not use on face/ open                           Wound or open sores. Avoid contact with eyes, ears mouth and genitals (private parts).                       Wash face,  Genitals (private parts)  with your normal soap.             6.  Wash thoroughly, paying special attention to the area where your surgery  will be performed.  7.  Thoroughly rinse your body with warm water from the neck down.  8.  DO NOT shower/wash with your normal soap after using and rinsing off  the CHG Soap.                9.  Pat yourself dry with a clean towel.            10.  Wear clean pajamas.            11.  Place clean sheets on your bed the night of your first shower and do not  sleep with pets. Day of Surgery : Do not apply any lotions/deodorants the morning of surgery.  Please wear clean clothes to the hospital/surgery center.  FAILURE TO FOLLOW THESE INSTRUCTIONS MAY RESULT IN THE CANCELLATION OF YOUR SURGERY PATIENT SIGNATURE_________________________________  NURSE SIGNATURE__________________________________  ________________________________________________________________________  Rogelia Mire  An incentive spirometer is a tool that can help keep your lungs clear and active. This tool measures how well you are filling your lungs with each breath. Taking long deep breaths may help reverse or decrease the chance of developing breathing (pulmonary) problems (especially infection) following: A long period of time when you are unable to move or be active. BEFORE THE PROCEDURE  If the spirometer includes an indicator to show your best effort, your nurse or respiratory therapist will set it to a desired goal. If possible, sit up straight or lean slightly forward. Try not to slouch. Hold the incentive spirometer in an upright position. INSTRUCTIONS FOR USE  Sit on the edge of your bed if possible, or sit up as far as you can in bed or on a chair. Hold the incentive spirometer in an upright position. Breathe out normally. Place the mouthpiece in your mouth and seal your lips tightly around it. Breathe in slowly and as deeply as possible, raising the piston or the ball toward the top of the  column. Hold your breath for 3-5 seconds or for as long as possible. Allow the piston or ball to fall to the bottom of the column. Remove the mouthpiece from your mouth and breathe out normally. Rest  for a few seconds and repeat Steps 1 through 7 at least 10 times every 1-2 hours when you are awake. Take your time and take a few normal breaths between deep breaths. The spirometer may include an indicator to show your best effort. Use the indicator as a goal to work toward during each repetition. After each set of 10 deep breaths, practice coughing to be sure your lungs are clear. If you have an incision (the cut made at the time of surgery), support your incision when coughing by placing a pillow or rolled up towels firmly against it. Once you are able to get out of bed, walk around indoors and cough well. You may stop using the incentive spirometer when instructed by your caregiver.  RISKS AND COMPLICATIONS Take your time so you do not get dizzy or light-headed. If you are in pain, you may need to take or ask for pain medication before doing incentive spirometry. It is harder to take a deep breath if you are having pain. AFTER USE Rest and breathe slowly and easily. It can be helpful to keep track of a log of your progress. Your caregiver can provide you with a simple table to help with this. If you are using the spirometer at home, follow these instructions: SEEK MEDICAL CARE IF:  You are having difficultly using the spirometer. You have trouble using the spirometer as often as instructed. Your pain medication is not giving enough relief while using the spirometer. You develop fever of 100.5 F (38.1 C) or higher. SEEK IMMEDIATE MEDICAL CARE IF:  You cough up bloody sputum that had not been present before. You develop fever of 102 F (38.9 C) or greater. You develop worsening pain at or near the incision site. MAKE SURE YOU:  Understand these instructions. Will watch your  condition. Will get help right away if you are not doing well or get worse. Document Released: 07/18/2006 Document Revised: 05/30/2011 Document Reviewed: 09/18/2006 [employer] Patient Information 2014 [employer], [place].   ________________________________________________________________________ WHAT IS A BLOOD TRANSFUSION? Blood Transfusion Information  A transfusion is the replacement of blood or some of its parts. Blood is made up of multiple cells which provide different functions. Red blood cells carry oxygen and are used for blood loss replacement. White blood cells fight against infection. Platelets control bleeding. Plasma helps clot blood. Other blood products are available for specialized needs, such as hemophilia or other clotting disorders. BEFORE THE TRANSFUSION  Who gives blood for transfusions?  Healthy volunteers who are fully evaluated to make sure their blood is safe. This is blood bank blood. Transfusion therapy is the safest it has ever been in the practice of medicine. Before blood is taken from a donor, a complete history is taken to make sure that person has no history of diseases nor engages in risky social behavior (examples are intravenous drug use or sexual activity with multiple partners). The donor's travel history is screened to minimize risk of transmitting infections, such as malaria. The donated blood is tested for signs of infectious diseases, such as HIV and hepatitis. The blood is then tested to be sure it is compatible with you in order to minimize the chance of a transfusion reaction. If you or a relative donates blood, this is often done in anticipation of surgery and is not appropriate for emergency situations. It takes many days to process the donated blood. RISKS AND COMPLICATIONS Although transfusion therapy is very safe and saves many lives, the main dangers of transfusion  include:  Getting an infectious disease. Developing a transfusion reaction. This is  an allergic reaction to something in the blood you were given. Every precaution is taken to prevent this. The decision to have a blood transfusion has been considered carefully by your caregiver before blood is given. Blood is not given unless the benefits outweigh the risks. AFTER THE TRANSFUSION Right after receiving a blood transfusion, you will usually feel much better and more energetic. This is especially true if your red blood cells have gotten low (anemic). The transfusion raises the level of the red blood cells which carry oxygen, and this usually causes an energy increase. The nurse administering the transfusion will monitor you carefully for complications. HOME CARE INSTRUCTIONS  No special instructions are needed after a transfusion. You may find your energy is better. Speak with your caregiver about any limitations on activity for underlying diseases you may have. SEEK MEDICAL CARE IF:  Your condition is not improving after your transfusion. You develop redness or irritation at the intravenous (IV) site. SEEK IMMEDIATE MEDICAL CARE IF:  Any of the following symptoms occur over the next 12 hours: Shaking chills. You have a temperature by mouth above 102 F (38.9 C), not controlled by medicine. Chest, back, or muscle pain. People around you feel you are not acting correctly or are confused. Shortness of breath or difficulty breathing. Dizziness and fainting. You get a rash or develop hives. You have a decrease in urine output. Your urine turns a dark color or changes to pink, red, or brown. Any of the following symptoms occur over the next 10 days: You have a temperature by mouth above 102 F (38.9 C), not controlled by medicine. Shortness of breath. Weakness after normal activity. The white part of the eye turns yellow (jaundice). You have a decrease in the amount of urine or are urinating less often. Your urine turns a dark color or changes to pink, red, or brown. Document  Released: 03/04/2000 Document Revised: 05/30/2011 Document Reviewed: 10/22/2007 [facility] Patient Information 2014 [place], [place].  _______________________________________________________________________

## 2023-01-30 NOTE — Progress Notes (Addendum)
PCP - Warner Mccreedy, MD Faith Massey 11-17-22 clearance on chart Cardiologist -  Neuro- Levert Feinstein, MD LOV 09-12-22 epic  PPM/ICD -  Device Orders -  Rep Notified -   Chest x-ray - 11-10-22 EKG - 07-14-22 epic Stress Test - 01-03-23 epic ECHO - 03-29-22 epic Cardiac Cath -   Sleep Study -  CPAP -   Fasting Blood Sugar -  Checks Blood Sugar _____ times a day  Blood Thinner Instructions:Elequis Last dose 02-05-23 Aspirin Instructions:  ERAS Protcol - PRE-SURGERY  G2-    COVID vaccine -no   Activity--Able to complete ADL's and climb a flight of stairs with no CP or SOB Anesthesia review: myasthenia gravis IVIG treatments , HTN, PE, Breast CA, pre DM  Patient denies shortness of breath, fever, cough and chest pain at PAT appointment   All instructions explained to the patient, with a verbal understanding of the material. Patient agrees to go over the instructions while at home for a better understanding. Patient also instructed to self quarantine after being tested for COVID-19. The opportunity to ask questions was provided.

## 2023-01-31 NOTE — Therapy (Signed)
OUTPATIENT PHYSICAL THERAPY TREATMENT  Patient Name: Faith Massey MRN: [id number] DOB:[birth year], 77 y.o., female Today's Date: 02/01/2023  END OF SESSION:  PT End of Session - 02/01/23 1357     Visit Number 20    Authorization Type [employer] MCR    Progress Note Due on Visit 29    PT Start Time 1358    PT Stop Time 1438    PT Time Calculation (min) 40 min    Activity Tolerance Patient tolerated treatment well    Behavior During Therapy WFL for tasks assessed/performed                 Past Medical History:  Diagnosis Date   Breast CA (HCC)    History of radiation therapy    Chest 06/09/2022 - 07/21/2022 - Dr. 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   Myasthenic syndrome (HCC) 04/12/2022   CVA (cerebral vascular accident) (HCC) 03/28/2022   HTN (hypertension) 03/28/2022   Chest mass 03/28/2022   Right knee injury 02/14/2017   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 10/16/2012   Osteoporosis 10/12/2012    PCP: Pearline Cables, MD  REFERRING PROVIDER: Cristie Hem, PA-C  REFERRING DIAG: [phone] (ICD-10-CM) - Chronic right shoulder pain  THERAPY DIAG:  Acute pain of right shoulder  Stiffness of right shoulder, not elsewhere classified  Muscle weakness (generalized)  Cramp and spasm  RATIONALE FOR EVALUATION AND TREATMENT: Rehabilitation  ONSET DATE:  06/20/22  NEXT MD VISIT: TBD   SUBJECTIVE:                                                                                                                                                                                      SUBJECTIVE STATEMENT:  The last few nights I have awoken with pain in the shoulder in the middle of the night. I had to take Tramadol. I have been getting things ready for my surgery and reaching up into the closets.   EVAL:  I had so much pain last night I  had to take a Tramadol. I can't get a blouse out of the closet. Hurts to reach out. Can't reach behind head and can't lift arm without support of other arm. A little better since injection last week. Just recently went off prednisone which she had for IVIG therapy. Diagnosed with Myasthenia gravis in January of this year (associated with the mass). 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 Tore biceps when she was changing the filter on her water filtering system, heard pop. She uses cane intermittently due to balance issues from Myasthenia gravis and prednisone.  Hand dominance: Right  PAIN:  Are you having pain? Yes: NPRS scale: 3/10, at night up to 6-7/10 Pain location: R ant/post upper shoulder Pain description: aching Aggravating factors: sleeping position, reaching away from body esp OH Relieving factors: Tylenol, horse liniment, massager device  PERTINENT HISTORY: MG, h/o radiation (March and May 2024) for mediastinal mass, HTN, OP  PRECAUTIONS: None  RED FLAGS: None   WEIGHT BEARING RESTRICTIONS: No  FALLS:  Has patient fallen in last 6 months? No  LIVING ENVIRONMENT: Lives with: lives alone Lives in: House/apartment Stairs: No Has following equipment at home: Single point cane  OCCUPATION: retired  PLOF: Independent with household mobility with device  PATIENT GOALS:full function of her R arm   OBJECTIVE:   DIAGNOSTIC FINDINGS:  [place]: Limited musculoskeletal ultrasound of the right  upper extremity, right  shoulder was performed.  Short axis evaluation of the bicipital groove  shows no cortical irregularity of the humeral head or groove of the  shoulder.  Within the bicipital groove there is a hypoechoic space with  absent visualization of the proximal head of the biceps tendon, indicative  of likely proximal bicep tendon tear.  Short and long axis evaluation of  the biceps tendon does show retraction into the proximal aspect of the  bicep musculature.  Evaluation of the [facility] joint shows at least moderate  arthritic changes without significant bursal distention.   PATIENT SURVEYS:  Quick Dash 68.2 / 100 = 68.2% ; 11/28/22 - 63.6 / 100 = 63.6%; 01/23/23 - 50.0 / 100 = 50.0% (LTG #6 met)   COGNITION: Overall cognitive status: Within functional limits for tasks assessed     SENSATION: WFL  POSTURE: R shoulder anterior, depressed R shoulder, scapula and R ilia  UPPER EXTREMITY ROM:   A/PROM Right eval R 11/28/22 L 11/28/22 R 12/28/22 R 01/11/23 R 01/23/23  Shoulder flexion 20/157 passively with clunk A: 33 with excessive substitution  P: 132 150 A: 33 very difficult P: 147  AROM:  120  A: 97 P: 145  Shoulder extension WNL 48 68   A: 55  Shoulder abduction 58/177 A: 59 P: 160 155 A: 56* P: full AROM: 110 A: 89 P: 108  Shoulder adduction        Shoulder internal rotation 55/62 A: 75 P: 65 85 A: 50 with blocking of humeral head P: 68 FIR- to L4-L5 A: FIR to L3   Shoulder external rotation 65/85 very uncomfortable A: 61 P: 85 90 A: 55 P: 68*   A: 84  Elbow flexion full       Elbow extension full       (Blank rows = not tested)  UPPER EXTREMITY MMT:  MMT Right eval R 11/28/22 L 11/28/22 R 01/23/23 L 01/23/23  Shoulder flexion 2- 2- 5 2+ 5  Shoulder extension 4+ 4 4 4 4   Shoulder abduction 4-* with elbow bent 2- 4+ 2+ 5  Shoulder adduction       Shoulder internal rotation 4- 4- 4+ 4 5  Shoulder external rotation 5 4- 4 4 4+  Middle trapezius       Lower trapezius        Elbow flexion 4* 4 5 4+ 5  Elbow extension 5 mild pain  4 4+ 4+ 4+  Wrist pronation       Wrist supination       Grip strength (lbs) 10 6 11 13 17   (Blank rows = not tested)   PALPATION:  Marked UT, pecs, ant shoulder, IS  Decreased stability in ant R shoulder joint    TODAY'S TREATMENT:                                                                                                                                         DATE:  02/01/23 THERAPEUTIC EXERCISE: to improve flexibility, strength and mobility.  Demonstration, verbal and tactile cues throughout for technique. UBE L2 x 8 min (4' each fwd & back) Modified "cabinet reaches" from counter top to top of wooden box placed on counter: Flexion with lacrosse ball x 20 Scaption with lacrosse ball x 12 R shoulder ball on wall shoulder CW/CCW circles at 90 flexion 1 x 10 each - increased difficulty today B shoulder blue/green ball roll-up into overhead flexion 2 x 5 Seated lat pulls 15# 2x10 Seated rows 20# 2x10 low grips S/L R shoulder ER x 10 with lacrosse ball; x 10 + to fatigue S/L R shoulder abduction x 10    01/23/23 - Recert THERAPEUTIC EXERCISE: to improve flexibility, strength and mobility.  Demonstration, verbal and tactile cues throughout for technique. UBE L2.0 x 8 min (4' each fwd & back)  THERAPEUTIC ACTIVITIES: UE ROM and MMT assessment QuickDASH: 50.0 / 100 = 50.0 % (LTG #6 met) Recommendations for ongoing PT including accommodations for upcoming surgery for partial colectomy on 02/08/2023  MANUAL THERAPY: To promote normalized muscle tension, improved joint mobility, increased ROM, and reduced pain.  R shoulder P/AAROM with PT facilitating coordinated scapular movement and inhibiting abnormal accessory movement   01/19/23 THERAPEUTIC EXERCISE: to improve flexibility, strength and mobility.  Demonstration, verbal and tactile cues throughout for technique. UBE L2.0 x 8 min (4' each fwd & back) Seated lat  pulls 15# 2x10 Seated rows 15# 2x10 low grips S/L R shoulder ER x 10 with lacrosse ball; x 10 w/o weight S/L R shoulder abduction x 10   STM to R LS,UT   01/16/23 - PN THERAPEUTIC EXERCISE: to improve flexibility, strength and mobility.  Demonstration, verbal and tactile cues throughout for technique. UBE L1.5 x 8 min (4' each fwd & back) R shoulder flexion cabinet reaches to bottom shelf of overhead cabinet x 3 before fatiguing  Modified "cabinet reaches" from counter top to top of wooden box placed on counter: Flexion with lacrosse ball x 15 Scaption  with lacrosse ball x 12 R shoulder ball on wall shoulder CW/CCW circles at 90 flexion 2 x 10 each R shoulder ball on wall shoulder CW/CCW circles at 90 scaption x 10 each B shoulder blue/green ball roll-up into overhead flexion 2 x 5 R shoulder rhythmic stabilization at 90 flexion and scaption with PT providing perturbations to ball on wall x 30" each Serratus roll-ups on purple FR on wall x 5 - switched to: Serratus slide with isometric horiz ABD & ER in pillowcase on wall x 10 Lat pull down 10# 2 x 10 using bar for R shoulder AAROM into overhead ROM on release   PATIENT EDUCATION: Education details: progress with PT and ongoing PT POC Person educated: Patient Education method: Explanation Education comprehension: verbalized understanding  HOME EXERCISE PROGRAM: Access Code: [email or web address] URL: [email or web address] Date: 02/01/2023 Prepared by: Raynelle Fanning  Exercises - Seated Shoulder Flexion Towel Slide at Table Top  - 2-3 x daily - 7 x weekly - 2 sets - 10 reps - 3 sec  hold - Seated Shoulder Scaption Slide at Table Top with Forearm in Neutral  - 2-3 x daily - 7 x weekly - 2 sets - 10 reps - 3 sec hold - Seated Shoulder Setting  - 2-3 x daily - 7 x weekly - 2 sets - 10 reps - 3 sec hold - Isometric Shoulder Flexion at Wall (Mirrored)  - 1-2 x daily - 7 x weekly - 2 sets - 5 reps - 5 sec hold - Isometric Shoulder  Abduction at Wall (Mirrored)  - 1-2 x daily - 7 x weekly - 2 sets - 5 reps - 5 sec hold - Isometric Shoulder Extension at Wall (Mirrored)  - 1-2 x daily - 7 x weekly - 2 sets - 5 reps - 5 sec hold - Gentle Vertical Shoulder Pendulum  - 2-3 x daily - 7 x weekly - 2 sets - 10 reps - Gentle Horizontal Shoulder Pendulum  - 2-3 x daily - 7 x weekly - 2 sets - 10 reps - Circular Shoulder Pendulum with Table Support  - 2-3 x daily - 7 x weekly - 2 sets - 10 reps - Supine Shoulder Flexion AAROM with Dowel  - 1-2 x daily - 7 x weekly - 2 sets - 10 reps - 3 sec hold - Supine Shoulder Scaption with Dowel  - 1-2 x daily - 7 x weekly - 2 sets - 10 reps - 3 sec hold - Supine Shoulder External Rotation with Dowel at 20-30 Degrees of Abduction  - 1-2 x daily - 7 x weekly - 2 sets - 10 reps - 3 sec hold - Isometric Shoulder Internal Rotation (Mirrored)  - 1 x daily - 7 x weekly - 2 sets - 5 reps - 5 sec hold - Isometric Shoulder External Rotation (Mirrored)  - 1 x daily - 7 x weekly - 2 sets - 5 reps - 5 sec hold - Serratus Activation at Wall  - 1 x daily - 7 x weekly - 2 sets - 10 reps - 3 sec hold - Sidelying Shoulder External Rotation (Mirrored)  - 1 x daily - 3 x weekly - 2 sets - 10 reps - Sidelying Shoulder Abduction Palm Forward  - 1 x daily - 3 x weekly - 2 sets - 10 reps - Standing Wall [employer] with Mini Swiss Ball  - 1 x daily - 3-4 x weekly - 1 sets - 10 reps - Standing Wall [employer] in  Scaption with Mini Swiss Ball  - 1 x daily - 3-4 x weekly - 1 sets - 10 reps   ASSESSMENT:  CLINICAL IMPRESSION: Sajdah reports some increased shoulder pain the past two nights requiring her to take her Tramadol. She had less tolerance to wall ball activities and fatigued quickly. She feels the machines really help her and also feel good to the shoulder so we returned to these today. HEP finalized for her break from PT due to surgery. She plans to return in early December once cleared by her surgeon.  OBJECTIVE  IMPAIRMENTS: decreased activity tolerance, decreased coordination, decreased ROM, decreased strength, increased fascial restrictions, impaired perceived functional ability, increased muscle spasms, impaired flexibility, impaired UE functional use, improper body mechanics, postural dysfunction, and pain.   ACTIVITY LIMITATIONS: carrying, lifting, sleeping, bathing, toileting, dressing, self feeding, reach over head, and hygiene/grooming  PARTICIPATION LIMITATIONS: meal prep, cleaning, laundry, driving, shopping, community activity, and yard work  PERSONAL FACTORS: Time since onset of injury/illness/exacerbation and 3+ comorbidities: Myasthenia gravis, h/o radiation for mediastinal mass, HTN, OP  are also affecting patient's functional outcome.   REHAB POTENTIAL: Good  CLINICAL DECISION MAKING: Evolving/moderate complexity  EVALUATION COMPLEXITY: Moderate   GOALS: Goals reviewed with patient? Yes  SHORT TERM GOALS: Target date: 11/07/2022, extended to 12/26/2022  Patient will be independent with initial HEP.  Baseline:  Goal status: MET - 10/26/22  2.  Patient able to sleep without waking from shoulder pain  Baseline:  Goal status: MET - 12/14/22   LONG TERM GOALS: Target date: 12/05/2022, extended to 01/23/2023, extended to 03/20/2023   Patient will be independent with advanced/ongoing HEP to improve outcomes and carryover.  Baseline:  Goal status: IN PROGRESS - 01/23/23 - met for current HEP  2.  Patient will report 75% improvement in R shoulder pain to improve QOL.  Baseline:  Goal status: IN PROGRESS - 01/23/23 - Pt reports ~50% improvement in pain but still will have occasional "catching" pain with some motions and/or discomfort with sleep   3. Improved R grip strength by 5-10#  Baseline: 10#; 12/28/22 - 11# Goal status: IN PROGRESS - 01/23/23 - 13#  4.  Patient to improve R shoulder AROM to [facility] without pain provocation to allow for increased ease of ADLs.  Baseline:  Goal status:  IN PROGRESS - 01/23/23 - R shoulder AROM progressing functionally  5.  Patient will demonstrate improved functional R UE strength as demonstrated her ability to use her arm to perform grooming and hygiene. Baseline:  Goal status: IN PROGRESS - 01/23/23 - Pt remains limited R UE functional use with most daily ADLs, but notes improving ability to do things like reach for faucets or clothes in the closet  6.  Patient will report 58/100 on Quick Dash to demonstrate improved functional ability.  Baseline: 68.2 / 100 = 68.2 %; 11/28/22 - 63.6 / 100 = 63.6 % Goal status: MET - 01/23/23 - 50.0 / 100 = 50.0 %   PLAN:  PT FREQUENCY: 2x/week  PT DURATION: 8 weeks  PLANNED INTERVENTIONS: Therapeutic exercises, Therapeutic activity, Neuromuscular re-education, Patient/Family education, Self Care, Joint mobilization, Aquatic Therapy, Dry Needling, Electrical stimulation, Spinal mobilization, Cryotherapy, Moist heat, Taping, Ionotophoresis 4mg /ml Dexamethasone, and Manual therapy  PLAN FOR NEXT SESSION: Shoulder/RC strength and shoulder AAROM; Scapular stabilization!; Review and update HEP as needed; possibly repeat Ktape application  Solon Palm, PT  02/01/2023, 3:02 PM

## 2023-02-01 ENCOUNTER — Ambulatory Visit: Payer: Medicare HMO | Admitting: Physical Therapy

## 2023-02-01 ENCOUNTER — Encounter: Payer: Self-pay | Admitting: Physical Therapy

## 2023-02-01 ENCOUNTER — Encounter: Payer: Self-pay | Admitting: Family Medicine

## 2023-02-01 DIAGNOSIS — M6281 Muscle weakness (generalized): Secondary | ICD-10-CM | POA: Diagnosis not present

## 2023-02-01 DIAGNOSIS — M25511 Pain in right shoulder: Secondary | ICD-10-CM | POA: Diagnosis not present

## 2023-02-01 DIAGNOSIS — R252 Cramp and spasm: Secondary | ICD-10-CM | POA: Diagnosis not present

## 2023-02-01 DIAGNOSIS — M25611 Stiffness of right shoulder, not elsewhere classified: Secondary | ICD-10-CM | POA: Diagnosis not present

## 2023-02-02 ENCOUNTER — Encounter (HOSPITAL_COMMUNITY): Admission: RE | Admit: 2023-02-02 | Payer: Medicare HMO | Source: Ambulatory Visit

## 2023-02-02 MED ORDER — TRAMADOL HCL 50 MG PO TABS: 50.0000 mg | ORAL_TABLET | Freq: Two times a day (BID) | ORAL | 0 refills | Status: DC | PRN

## 2023-02-03 ENCOUNTER — Other Ambulatory Visit: Payer: Self-pay

## 2023-02-03 ENCOUNTER — Encounter (HOSPITAL_COMMUNITY): Payer: Self-pay

## 2023-02-03 ENCOUNTER — Encounter (HOSPITAL_COMMUNITY)
Admission: RE | Admit: 2023-02-03 | Discharge: 2023-02-03 | Disposition: A | Discharge status: Home / Self Care | Payer: Medicare HMO | Source: Ambulatory Visit | Attending: General Surgery

## 2023-02-03 VITALS — BP 120/58 | HR 72 | Temp 98.4°F | Resp 16 | Ht 58.5 in | Wt 110.0 lb

## 2023-02-03 DIAGNOSIS — Z01812 Encounter for preprocedural laboratory examination: Secondary | ICD-10-CM | POA: Insufficient documentation

## 2023-02-03 DIAGNOSIS — I10 Essential (primary) hypertension: Secondary | ICD-10-CM | POA: Diagnosis not present

## 2023-02-03 DIAGNOSIS — Z01818 Encounter for other preprocedural examination: Secondary | ICD-10-CM | POA: Diagnosis not present

## 2023-02-03 HISTORY — DX: Unspecified osteoarthritis, unspecified site: M19.90

## 2023-02-03 HISTORY — DX: Pneumonia, unspecified organism: J18.9

## 2023-02-03 HISTORY — DX: Prediabetes: R73.03

## 2023-02-03 HISTORY — DX: Laceration of muscle, fascia and tendon of long head of biceps, unspecified arm, initial encounter: S46.129A

## 2023-02-03 LAB — BASIC METABOLIC PANEL
Anion gap: 8 (ref 5–15)
BUN: 24 mg/dL — ABNORMAL HIGH (ref 8–23)
CO2: 23 mmol/L (ref 22–32)
Calcium: 9.4 mg/dL (ref 8.9–10.3)
Chloride: 105 mmol/L (ref 98–111)
Creatinine, Ser: 0.67 mg/dL (ref 0.44–1.00)
GFR, Estimated: >60 mL/min (ref >60)
Glucose, Bld: 94 mg/dL (ref 70–99)
Potassium: 4.6 mmol/L (ref 3.5–5.1)
Sodium: 136 mmol/L (ref 135–145)

## 2023-02-03 LAB — CBC
HCT: 36.5 % (ref 36.0–46.0)
Hemoglobin: 11.8 g/dL — ABNORMAL LOW (ref 12.0–15.0)
MCH: 33.2 pg (ref 26.0–34.0)
MCHC: 32.3 g/dL (ref 30.0–36.0)
MCV: 102.8 fL — ABNORMAL HIGH (ref 80.0–100.0)
Platelets: 237 10*3/uL (ref 150–400)
RBC: 3.55 MIL/uL — ABNORMAL LOW (ref 3.87–5.11)
RDW: 13.9 % (ref 11.5–15.5)
WBC: 7.7 10*3/uL (ref 4.0–10.5)
nRBC: 0 % (ref 0.0–0.2)

## 2023-02-06 ENCOUNTER — Encounter: Payer: Self-pay | Admitting: Family Medicine

## 2023-02-07 NOTE — H&P (Signed)
Faith Massey is an 77 y.o. female.    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 Colonoscopy negative for malignancy. CT with bilateral single ureters.  Today "Faith Massey" is seen for cysto, bilateral retrogrades / ICG injection for ureteral identificaiton as part of partial colectomy for CV fistula.   Past Medical History:  Diagnosis Date   Arthritis    Breast CA [facility])    History of radiation therapy    Chest 06/09/2022 - 07/21/2022 - Dr. Antony Blackbird   Hyperlipidemia    Hypertension    Laceration of muscle, fascia and tendon of long head of biceps, unspecified arm, initial encounter    right arm  involving rotator  cuff   Myasthenia gravis (HCC)    Dx'd 03/2022   Osteoporosis 10/12/2012   Pneumonia    Pre-diabetes     Past Surgical History:  Procedure Laterality Date   ABDOMINAL HYSTERECTOMY  03/22/2003   BACK SURGERY  03/21/1998   BREAST SURGERY     reconstruction   MASTECTOMY Bilateral 03/21/1993   RESECTION OF A THYMOMA     04-25-22    Family History  Problem Relation Age of Onset   Stroke Mother    Hypertension Mother    Myasthenia gravis Mother    Hypertension Father    Colon cancer Neg Hx    Esophageal cancer Neg Hx    Social History:  reports that she has never smoked. She has never used smokeless tobacco. She reports that she does not currently use alcohol after a past usage of about 1.0 standard drink of alcohol per week. She reports that she does not use drugs.  Allergies:  Allergies  Allergen Reactions   Prednisone Shortness Of Breath, Swelling, Palpitations, Dermatitis and Hypertension    Patient reports having an intolerance to medication. Reports medication caused swelling and metallic taste in mouth.    No medications prior to admission.    No  results found for this or any previous visit (from the past 48 hour(s)). No results found.  Review of Systems  Constitutional:  Negative for chills and fever.  Genitourinary:  Positive for dysuria.  All other systems reviewed and are negative.   There were no vitals taken for this visit. Physical Exam Vitals reviewed.  HENT:     Head: Normocephalic.  Eyes:     Pupils: Pupils are equal, round, and reactive to light.  Cardiovascular:     Rate and Rhythm: Normal rate.  Pulmonary:     Effort: Pulmonary effort is normal.  Abdominal:     General: Abdomen is flat.  Genitourinary:    Comments: No CVAT at present Musculoskeletal:        General: Normal range of motion.     Cervical back: Normal range of motion.  Skin:    General: Skin is warm.  Neurological:     General: No focal deficit present.     Mental Status: She is alert.  Psychiatric:        Mood and Affect: Mood normal.      Assessment/Plan  Proceed as planned with cysto / ICG, bilateral retrogrades to aide in ureteral marking durign complex partial colectomy / CV fistula repair. Risks, benefits, alternatives, expected peri-op course discussed. We can certainly aide in CV fistula repair aspect if  needed, especially if defect close to left ureter / trigone.   Loletta Parish., MD 02/07/2023, 4:48 PM

## 2023-02-07 NOTE — Anesthesia Preprocedure Evaluation (Signed)
Anesthesia Evaluation  Patient identified by MRN, date of birth, ID band Patient awake    Reviewed: Allergy & Precautions, NPO status , Patient's Chart, lab work & pertinent test results  History of Anesthesia Complications Negative for: history of anesthetic complications  Airway Mallampati: I  TM Distance: >3 FB Neck ROM: Full    Dental  (+) Dental Advisory Given   Pulmonary PE   breath sounds clear to auscultation       Cardiovascular hypertension, Pt. on medications (-) angina  Rhythm:Regular Rate:Normal  12/2022 Stress: normal LV perfusion, EF 71%  03/2022 ECHO: EF 60-65%, moderate ASH with Grade 1 DD, normal RVF, no significant valvular abnormalities   Neuro/Psych myasthenia  Neuromuscular disease (myasthenia gravis: s/p thymectomy, on mestinon) CVA, No Residual Symptoms  negative psych ROS   GI/Hepatic Neg liver ROS,,,Colo-vesicular fistula   Endo/Other  negative endocrine ROS    Renal/GU Renal InsufficiencyRenal disease     Musculoskeletal  (+) Arthritis ,    Abdominal   Peds  Hematology Eliquis: last dose sunday Hb 11.3, plt 237k   Anesthesia Other Findings H/o breast cancer  Reproductive/Obstetrics                             Anesthesia Physical Anesthesia Plan  ASA: 3  Anesthesia Plan: General   Post-op Pain Management: Tylenol PO (pre-op)*   Induction: Intravenous  PONV Risk Score and Plan: 3 and Ondansetron, Dexamethasone and Treatment may vary due to age or medical condition  Airway Management Planned: Oral ETT  Additional Equipment: None  Intra-op Plan:   Post-operative Plan: Extubation in OR  Informed Consent: I have reviewed the patients History and Physical, chart, labs and discussed the procedure including the risks, benefits and alternatives for the proposed anesthesia with the patient or authorized representative who has indicated his/her understanding  and acceptance.     Dental advisory given  Plan Discussed with: CRNA and Surgeon  Anesthesia Plan Comments:         Anesthesia Quick Evaluation

## 2023-02-08 ENCOUNTER — Inpatient Hospital Stay (HOSPITAL_COMMUNITY): Payer: Medicare HMO

## 2023-02-08 ENCOUNTER — Inpatient Hospital Stay (HOSPITAL_COMMUNITY): Payer: Self-pay | Admitting: Physician Assistant

## 2023-02-08 ENCOUNTER — Inpatient Hospital Stay (HOSPITAL_COMMUNITY): Payer: Medicare HMO | Admitting: Anesthesiology

## 2023-02-08 ENCOUNTER — Other Ambulatory Visit: Payer: Self-pay

## 2023-02-08 ENCOUNTER — Inpatient Hospital Stay (HOSPITAL_COMMUNITY)
Admission: RE | Admit: 2023-02-08 | Discharge: 2023-02-11 | DRG: 330 | Disposition: A | Discharge status: Home / Self Care | Payer: Medicare HMO | Source: Ambulatory Visit | Attending: General Surgery | Admitting: General Surgery

## 2023-02-08 ENCOUNTER — Encounter (HOSPITAL_COMMUNITY): Payer: Self-pay | Admitting: General Surgery

## 2023-02-08 ENCOUNTER — Encounter (HOSPITAL_COMMUNITY)
Admission: RE | Disposition: A | Discharge status: Home / Self Care | Payer: Self-pay | Source: Ambulatory Visit | Attending: General Surgery

## 2023-02-08 DIAGNOSIS — Z86711 Personal history of pulmonary embolism: Secondary | ICD-10-CM | POA: Diagnosis not present

## 2023-02-08 DIAGNOSIS — Z5941 Food insecurity: Secondary | ICD-10-CM

## 2023-02-08 DIAGNOSIS — Z9013 Acquired absence of bilateral breasts and nipples: Secondary | ICD-10-CM | POA: Diagnosis not present

## 2023-02-08 DIAGNOSIS — Z90710 Acquired absence of both cervix and uterus: Secondary | ICD-10-CM

## 2023-02-08 DIAGNOSIS — N189 Chronic kidney disease, unspecified: Secondary | ICD-10-CM | POA: Diagnosis not present

## 2023-02-08 DIAGNOSIS — N321 Vesicointestinal fistula: Secondary | ICD-10-CM

## 2023-02-08 DIAGNOSIS — Z7901 Long term (current) use of anticoagulants: Secondary | ICD-10-CM | POA: Diagnosis not present

## 2023-02-08 DIAGNOSIS — K5790 Diverticulosis of intestine, part unspecified, without perforation or abscess without bleeding: Secondary | ICD-10-CM | POA: Diagnosis not present

## 2023-02-08 DIAGNOSIS — G7000 Myasthenia gravis without (acute) exacerbation: Secondary | ICD-10-CM | POA: Diagnosis present

## 2023-02-08 DIAGNOSIS — I129 Hypertensive chronic kidney disease with stage 1 through stage 4 chronic kidney disease, or unspecified chronic kidney disease: Secondary | ICD-10-CM

## 2023-02-08 DIAGNOSIS — Z888 Allergy status to other drugs, medicaments and biological substances status: Secondary | ICD-10-CM

## 2023-02-08 DIAGNOSIS — Z820 Family history of epilepsy and other diseases of the nervous system: Secondary | ICD-10-CM | POA: Diagnosis not present

## 2023-02-08 DIAGNOSIS — K5730 Diverticulosis of large intestine without perforation or abscess without bleeding: Principal | ICD-10-CM | POA: Diagnosis present

## 2023-02-08 DIAGNOSIS — Z5986 Financial insecurity: Secondary | ICD-10-CM | POA: Diagnosis not present

## 2023-02-08 DIAGNOSIS — Z853 Personal history of malignant neoplasm of breast: Secondary | ICD-10-CM | POA: Diagnosis not present

## 2023-02-08 DIAGNOSIS — Z923 Personal history of irradiation: Secondary | ICD-10-CM | POA: Diagnosis not present

## 2023-02-08 DIAGNOSIS — I2510 Atherosclerotic heart disease of native coronary artery without angina pectoris: Secondary | ICD-10-CM | POA: Diagnosis not present

## 2023-02-08 DIAGNOSIS — I10 Essential (primary) hypertension: Secondary | ICD-10-CM | POA: Diagnosis present

## 2023-02-08 DIAGNOSIS — E785 Hyperlipidemia, unspecified: Secondary | ICD-10-CM | POA: Diagnosis present

## 2023-02-08 DIAGNOSIS — Z79899 Other long term (current) drug therapy: Secondary | ICD-10-CM

## 2023-02-08 DIAGNOSIS — Z7982 Long term (current) use of aspirin: Secondary | ICD-10-CM | POA: Diagnosis not present

## 2023-02-08 DIAGNOSIS — R7303 Prediabetes: Secondary | ICD-10-CM | POA: Diagnosis not present

## 2023-02-08 DIAGNOSIS — Z823 Family history of stroke: Secondary | ICD-10-CM

## 2023-02-08 DIAGNOSIS — R197 Diarrhea, unspecified: Secondary | ICD-10-CM | POA: Diagnosis not present

## 2023-02-08 DIAGNOSIS — N138 Other obstructive and reflux uropathy: Secondary | ICD-10-CM | POA: Diagnosis not present

## 2023-02-08 DIAGNOSIS — Z8673 Personal history of transient ischemic attack (TIA), and cerebral infarction without residual deficits: Secondary | ICD-10-CM | POA: Diagnosis not present

## 2023-02-08 DIAGNOSIS — K632 Fistula of intestine: Secondary | ICD-10-CM | POA: Diagnosis not present

## 2023-02-08 DIAGNOSIS — K5732 Diverticulitis of large intestine without perforation or abscess without bleeding: Secondary | ICD-10-CM | POA: Diagnosis not present

## 2023-02-08 DIAGNOSIS — Z8249 Family history of ischemic heart disease and other diseases of the circulatory system: Secondary | ICD-10-CM | POA: Diagnosis not present

## 2023-02-08 DIAGNOSIS — Z408 Encounter for other prophylactic surgery: Secondary | ICD-10-CM | POA: Diagnosis not present

## 2023-02-08 HISTORY — PX: CYSTOSCOPY WITH STENT PLACEMENT: SHX5790

## 2023-02-08 HISTORY — DX: Diverticulosis of intestine, part unspecified, without perforation or abscess without bleeding: K57.90

## 2023-02-08 LAB — TYPE AND SCREEN
ABO/RH(D): A POS
Antibody Screen: NEGATIVE

## 2023-02-08 SURGERY — COLECTOMY, PARTIAL, ROBOT-ASSISTED, LAPAROSCOPIC
Anesthesia: General

## 2023-02-08 MED ORDER — PHENYLEPHRINE HCL-NACL 20-0.9 MG/250ML-% IV SOLN
INTRAVENOUS | Status: DC | PRN
  Administered 2023-02-08: 40 ug/min via INTRAVENOUS

## 2023-02-08 MED ORDER — BUPIVACAINE-EPINEPHRINE 0.25% -1:200000 IJ SOLN
INTRAMUSCULAR | Status: AC
Start: 2023-02-08 — End: ?
  Filled 2023-02-08: qty 1

## 2023-02-08 MED ORDER — SODIUM CHLORIDE 0.9 % IV SOLN
2.0000 g | INTRAVENOUS | Status: AC
  Administered 2023-02-08: 2 g via INTRAVENOUS
  Filled 2023-02-08: qty 2

## 2023-02-08 MED ORDER — ROCURONIUM BROMIDE 100 MG/10ML IV SOLN
INTRAVENOUS | Status: DC | PRN
  Administered 2023-02-08: 60 mg via INTRAVENOUS

## 2023-02-08 MED ORDER — ONDANSETRON HCL 4 MG/2ML IJ SOLN
INTRAMUSCULAR | Status: AC
  Filled 2023-02-08: qty 2

## 2023-02-08 MED ORDER — HYDROMORPHONE HCL 1 MG/ML IJ SOLN: 0.2500 mg | INTRAMUSCULAR | Status: DC | PRN

## 2023-02-08 MED ORDER — SIMETHICONE 80 MG PO CHEW: 40.0000 mg | CHEWABLE_TABLET | Freq: Four times a day (QID) | ORAL | Status: DC | PRN

## 2023-02-08 MED ORDER — ALVIMOPAN 12 MG PO CAPS
12.0000 mg | ORAL_CAPSULE | ORAL | Status: AC
  Administered 2023-02-08: 12 mg via ORAL
  Filled 2023-02-08: qty 1

## 2023-02-08 MED ORDER — SODIUM CHLORIDE 0.9 % IV SOLN
2.0000 g | Freq: Two times a day (BID) | INTRAVENOUS | Status: AC
  Administered 2023-02-08: 2 g via INTRAVENOUS
  Filled 2023-02-08: qty 2

## 2023-02-08 MED ORDER — PHENYLEPHRINE HCL (PRESSORS) 10 MG/ML IV SOLN
INTRAVENOUS | Status: DC | PRN
  Administered 2023-02-08 (×2): 80 ug via INTRAVENOUS

## 2023-02-08 MED ORDER — BISACODYL 5 MG PO TBEC: 20.0000 mg | DELAYED_RELEASE_TABLET | Freq: Once | ORAL | Status: DC

## 2023-02-08 MED ORDER — LIDOCAINE HCL (CARDIAC) PF 100 MG/5ML IV SOSY
PREFILLED_SYRINGE | INTRAVENOUS | Status: DC | PRN
  Administered 2023-02-08: 40 mg via INTRAVENOUS

## 2023-02-08 MED ORDER — ORAL CARE MOUTH RINSE: 15.0000 mL | Freq: Once | OROMUCOSAL | Status: AC

## 2023-02-08 MED ORDER — DEXMEDETOMIDINE HCL IN NACL 80 MCG/20ML IV SOLN
INTRAVENOUS | Status: AC
  Filled 2023-02-08: qty 20

## 2023-02-08 MED ORDER — 0.9 % SODIUM CHLORIDE (POUR BTL) OPTIME
TOPICAL | Status: DC | PRN
  Administered 2023-02-08: 2000 mL

## 2023-02-08 MED ORDER — LIDOCAINE HCL (PF) 2 % IJ SOLN
INTRAMUSCULAR | Status: AC
  Filled 2023-02-08: qty 5

## 2023-02-08 MED ORDER — CHLORHEXIDINE GLUCONATE 0.12 % MT SOLN
15.0000 mL | Freq: Once | OROMUCOSAL | Status: AC
Start: 2023-02-08 — End: 2023-02-08
  Administered 2023-02-08: 15 mL via OROMUCOSAL

## 2023-02-08 MED ORDER — PROPOFOL 500 MG/50ML IV EMUL
INTRAVENOUS | Status: AC
  Filled 2023-02-08: qty 50

## 2023-02-08 MED ORDER — ONDANSETRON HCL 4 MG/2ML IJ SOLN
INTRAMUSCULAR | Status: DC | PRN
  Administered 2023-02-08: 4 mg via INTRAVENOUS

## 2023-02-08 MED ORDER — DIPHENHYDRAMINE HCL 12.5 MG/5ML PO ELIX: 12.5000 mg | ORAL_SOLUTION | Freq: Four times a day (QID) | ORAL | Status: DC | PRN

## 2023-02-08 MED ORDER — FENTANYL CITRATE (PF) 100 MCG/2ML IJ SOLN
INTRAMUSCULAR | Status: AC
  Filled 2023-02-08: qty 2

## 2023-02-08 MED ORDER — IOHEXOL 300 MG/ML  SOLN
INTRAMUSCULAR | Status: DC | PRN
  Administered 2023-02-08: 10 mL

## 2023-02-08 MED ORDER — PROPOFOL 10 MG/ML IV BOLUS
INTRAVENOUS | Status: DC | PRN
  Administered 2023-02-08: 120 mg via INTRAVENOUS

## 2023-02-08 MED ORDER — STERILE WATER FOR INJECTION IJ SOLN
INTRAMUSCULAR | Status: DC | PRN
  Administered 2023-02-08: 10 mL via INTRAMUSCULAR

## 2023-02-08 MED ORDER — ALUM & MAG HYDROXIDE-SIMETH 200-200-20 MG/5ML PO SUSP
30.0000 mL | Freq: Four times a day (QID) | ORAL | Status: DC | PRN
Start: 2023-02-08 — End: 2023-02-11

## 2023-02-08 MED ORDER — DEXAMETHASONE SODIUM PHOSPHATE 10 MG/ML IJ SOLN
INTRAMUSCULAR | Status: DC | PRN
  Administered 2023-02-08: 10 mg via INTRAVENOUS

## 2023-02-08 MED ORDER — GABAPENTIN 100 MG PO CAPS
300.0000 mg | ORAL_CAPSULE | Freq: Two times a day (BID) | ORAL | Status: DC
  Administered 2023-02-08 – 2023-02-11 (×6): 300 mg via ORAL
  Filled 2023-02-08 (×6): qty 3

## 2023-02-08 MED ORDER — MIDAZOLAM HCL 2 MG/2ML IJ SOLN: 0.5000 mg | Freq: Once | INTRAMUSCULAR | Status: DC | PRN

## 2023-02-08 MED ORDER — BUPIVACAINE LIPOSOME 1.3 % IJ SUSP
INTRAMUSCULAR | Status: AC
  Filled 2023-02-08: qty 20

## 2023-02-08 MED ORDER — ALVIMOPAN 12 MG PO CAPS
12.0000 mg | ORAL_CAPSULE | Freq: Two times a day (BID) | ORAL | Status: DC
Start: 2023-02-09 — End: 2023-02-10
  Administered 2023-02-09: 12 mg via ORAL
  Filled 2023-02-08 (×2): qty 1

## 2023-02-08 MED ORDER — OXYCODONE HCL 5 MG/5ML PO SOLN: 5.0000 mg | Freq: Once | ORAL | Status: DC | PRN

## 2023-02-08 MED ORDER — LACTATED RINGERS IV SOLN: INTRAVENOUS | Status: DC

## 2023-02-08 MED ORDER — PHENYLEPHRINE HCL (PRESSORS) 10 MG/ML IV SOLN
INTRAVENOUS | Status: AC
  Filled 2023-02-08: qty 1

## 2023-02-08 MED ORDER — ENSURE PRE-SURGERY PO LIQD
296.0000 mL | Freq: Once | ORAL | Status: DC
  Filled 2023-02-08: qty 296

## 2023-02-08 MED ORDER — ENSURE PRE-SURGERY PO LIQD
592.0000 mL | Freq: Once | ORAL | Status: DC
  Filled 2023-02-08: qty 592

## 2023-02-08 MED ORDER — ACETAMINOPHEN 500 MG PO TABS
1000.0000 mg | ORAL_TABLET | Freq: Once | ORAL | Status: AC
  Administered 2023-02-08: 1000 mg via ORAL
  Filled 2023-02-08: qty 2

## 2023-02-08 MED ORDER — FENTANYL CITRATE (PF) 100 MCG/2ML IJ SOLN
INTRAMUSCULAR | Status: DC | PRN
  Administered 2023-02-08 (×3): 50 ug via INTRAVENOUS

## 2023-02-08 MED ORDER — LISINOPRIL 20 MG PO TABS
40.0000 mg | ORAL_TABLET | Freq: Every day | ORAL | Status: DC
  Administered 2023-02-10 – 2023-02-11 (×2): 40 mg via ORAL
  Filled 2023-02-08 (×3): qty 2

## 2023-02-08 MED ORDER — ENOXAPARIN SODIUM 40 MG/0.4ML IJ SOSY
40.0000 mg | PREFILLED_SYRINGE | INTRAMUSCULAR | Status: DC
  Administered 2023-02-09 – 2023-02-11 (×3): 40 mg via SUBCUTANEOUS
  Filled 2023-02-08 (×3): qty 0.4

## 2023-02-08 MED ORDER — ALBUMIN HUMAN 5 % IV SOLN: INTRAVENOUS | Status: DC | PRN

## 2023-02-08 MED ORDER — SACCHAROMYCES BOULARDII 250 MG PO CAPS
250.0000 mg | ORAL_CAPSULE | Freq: Two times a day (BID) | ORAL | Status: DC
  Administered 2023-02-08 – 2023-02-11 (×6): 250 mg via ORAL
  Filled 2023-02-08 (×6): qty 1

## 2023-02-08 MED ORDER — BUPIVACAINE LIPOSOME 1.3 % IJ SUSP
INTRAMUSCULAR | Status: DC | PRN
  Administered 2023-02-08: 20 mL

## 2023-02-08 MED ORDER — HEPARIN SODIUM (PORCINE) 5000 UNIT/ML IJ SOLN
5000.0000 [IU] | Freq: Once | INTRAMUSCULAR | Status: AC
  Administered 2023-02-08: 5000 [IU] via SUBCUTANEOUS
  Filled 2023-02-08: qty 1

## 2023-02-08 MED ORDER — BUPIVACAINE-EPINEPHRINE 0.25% -1:200000 IJ SOLN
INTRAMUSCULAR | Status: DC | PRN
  Administered 2023-02-08: 30 mL

## 2023-02-08 MED ORDER — RINGERS IRRIGATION IR SOLN
Status: DC | PRN
  Administered 2023-02-08: 1000 mL

## 2023-02-08 MED ORDER — OXYCODONE HCL 5 MG PO TABS: 5.0000 mg | ORAL_TABLET | Freq: Once | ORAL | Status: DC | PRN

## 2023-02-08 MED ORDER — DEXAMETHASONE SODIUM PHOSPHATE 10 MG/ML IJ SOLN
INTRAMUSCULAR | Status: AC
  Filled 2023-02-08: qty 1

## 2023-02-08 MED ORDER — PHENYLEPHRINE 80 MCG/ML (10ML) SYRINGE FOR IV PUSH (FOR BLOOD PRESSURE SUPPORT)
PREFILLED_SYRINGE | INTRAVENOUS | Status: AC
  Filled 2023-02-08: qty 10

## 2023-02-08 MED ORDER — INDOCYANINE GREEN 25 MG IV SOLR
INTRAVENOUS | Status: DC | PRN
  Administered 2023-02-08: 6.25 mg via INTRAVENOUS

## 2023-02-08 MED ORDER — TRAMADOL HCL 50 MG PO TABS
50.0000 mg | ORAL_TABLET | Freq: Two times a day (BID) | ORAL | Status: DC | PRN
  Administered 2023-02-09: 50 mg via ORAL
  Administered 2023-02-10 – 2023-02-11 (×3): 100 mg via ORAL
  Filled 2023-02-08: qty 1
  Filled 2023-02-08 (×4): qty 2

## 2023-02-08 MED ORDER — BUPIVACAINE LIPOSOME 1.3 % IJ SUSP: 20.0000 mL | Freq: Once | INTRAMUSCULAR | Status: DC

## 2023-02-08 MED ORDER — KETAMINE HCL 50 MG/5ML IJ SOSY
PREFILLED_SYRINGE | INTRAMUSCULAR | Status: AC
  Filled 2023-02-08: qty 5

## 2023-02-08 MED ORDER — PROPOFOL 10 MG/ML IV BOLUS
INTRAVENOUS | Status: AC
  Filled 2023-02-08: qty 20

## 2023-02-08 MED ORDER — POLYETHYLENE GLYCOL 3350 17 GM/SCOOP PO POWD: 1.0000 | Freq: Once | ORAL | Status: DC

## 2023-02-08 MED ORDER — ACETAMINOPHEN 500 MG PO TABS: 1000.0000 mg | ORAL_TABLET | ORAL | Status: DC

## 2023-02-08 MED ORDER — DIPHENHYDRAMINE HCL 50 MG/ML IJ SOLN: 12.5000 mg | Freq: Four times a day (QID) | INTRAMUSCULAR | Status: DC | PRN

## 2023-02-08 MED ORDER — HYDROMORPHONE HCL 1 MG/ML IJ SOLN: 0.5000 mg | INTRAMUSCULAR | Status: DC | PRN

## 2023-02-08 MED ORDER — KCL IN DEXTROSE-NACL 20-5-0.45 MEQ/L-%-% IV SOLN
INTRAVENOUS | Status: DC
  Filled 2023-02-08: qty 1000

## 2023-02-08 MED ORDER — SUGAMMADEX SODIUM 200 MG/2ML IV SOLN
INTRAVENOUS | Status: DC | PRN
Start: 2023-02-08 — End: 2023-02-08
  Administered 2023-02-08: 150 mg via INTRAVENOUS

## 2023-02-08 MED ORDER — ACETAMINOPHEN 500 MG PO TABS
1000.0000 mg | ORAL_TABLET | Freq: Four times a day (QID) | ORAL | Status: DC
  Administered 2023-02-08 – 2023-02-11 (×10): 1000 mg via ORAL
  Filled 2023-02-08 (×11): qty 2

## 2023-02-08 MED ORDER — ONDANSETRON HCL 4 MG PO TABS: 4.0000 mg | ORAL_TABLET | Freq: Four times a day (QID) | ORAL | Status: DC | PRN

## 2023-02-08 MED ORDER — ENSURE SURGERY PO LIQD
237.0000 mL | Freq: Two times a day (BID) | ORAL | Status: DC
  Administered 2023-02-10: 237 mL via ORAL

## 2023-02-08 MED ORDER — GABAPENTIN 300 MG PO CAPS
300.0000 mg | ORAL_CAPSULE | ORAL | Status: AC
  Administered 2023-02-08: 300 mg via ORAL
  Filled 2023-02-08: qty 1

## 2023-02-08 MED ORDER — ONDANSETRON HCL 4 MG/2ML IJ SOLN: 4.0000 mg | Freq: Four times a day (QID) | INTRAMUSCULAR | Status: DC | PRN

## 2023-02-08 MED ORDER — PYRIDOSTIGMINE BROMIDE 60 MG PO TABS
60.0000 mg | ORAL_TABLET | Freq: Two times a day (BID) | ORAL | Status: DC
  Administered 2023-02-08 – 2023-02-10 (×4): 60 mg via ORAL
  Filled 2023-02-08 (×4): qty 1

## 2023-02-08 MED ORDER — STERILE WATER FOR INJECTION IJ SOLN
INTRAMUSCULAR | Status: AC
  Filled 2023-02-08: qty 10

## 2023-02-08 SURGICAL SUPPLY — 90 items
ADAPTER GOLDBERG URETERAL (ADAPTER) IMPLANT
BAG COUNTER SPONGE SURGICOUNT (BAG) ×1 IMPLANT
BAG URO CATCHER STRL LF (MISCELLANEOUS) ×1 IMPLANT
BASKET ZERO TIP NITINOL 2.4FR (BASKET) IMPLANT
BLADE EXTENDED COATED 6.5IN (ELECTRODE) IMPLANT
CANNULA REDUCER 12-8 DVNC XI (CANNULA) IMPLANT
CATH URETL OPEN END 6FR 70 (CATHETERS) IMPLANT
CELLS DAT CNTRL 66122 CELL SVR (MISCELLANEOUS) IMPLANT
CLOTH BEACON ORANGE TIMEOUT ST (SAFETY) ×1 IMPLANT
COVER SURGICAL LIGHT HANDLE (MISCELLANEOUS) ×2 IMPLANT
COVER TIP SHEARS 8 DVNC (MISCELLANEOUS) ×1 IMPLANT
DRAIN CHANNEL 19F RND (DRAIN) IMPLANT
DRAPE ARM DVNC X/XI (DISPOSABLE) ×4 IMPLANT
DRAPE COLUMN DVNC XI (DISPOSABLE) ×1 IMPLANT
DRAPE SURG IRRIG POUCH 19X23 (DRAPES) ×1 IMPLANT
DRIVER NDL LRG 8 DVNC XI (INSTRUMENTS) ×1 IMPLANT
DRIVER NDLE LRG 8 DVNC XI (INSTRUMENTS) ×1 IMPLANT
DRSG OPSITE POSTOP 4X10 (GAUZE/BANDAGES/DRESSINGS) IMPLANT
DRSG OPSITE POSTOP 4X6 (GAUZE/BANDAGES/DRESSINGS) IMPLANT
DRSG OPSITE POSTOP 4X8 (GAUZE/BANDAGES/DRESSINGS) IMPLANT
ELECT PENCIL ROCKER SW 15FT (MISCELLANEOUS) ×1 IMPLANT
ELECT REM PT RETURN 15FT ADLT (MISCELLANEOUS) ×1 IMPLANT
ENDOLOOP SUT PDS II 0 18 (SUTURE) IMPLANT
EVACUATOR SILICONE 100CC (DRAIN) IMPLANT
GLOVE BIO SURGEON STRL SZ 6.5 (GLOVE) ×3 IMPLANT
GLOVE INDICATOR 6.5 STRL GRN (GLOVE) ×3 IMPLANT
GLOVE SURG LX STRL 7.5 STRW (GLOVE) ×1 IMPLANT
GOWN SRG XL LVL 4 BRTHBL STRL (GOWNS) ×2 IMPLANT
GOWN STRL REUS W/ TWL XL LVL3 (GOWN DISPOSABLE) ×4 IMPLANT
GRASPER SUT TROCAR 14GX15 (MISCELLANEOUS) IMPLANT
GRASPER TIP-UP FEN DVNC XI (INSTRUMENTS) ×1 IMPLANT
GUIDEWIRE ANG ZIPWIRE 038X150 (WIRE) ×1 IMPLANT
GUIDEWIRE STR DUAL SENSOR (WIRE) IMPLANT
HOLDER FOLEY CATH W/STRAP (MISCELLANEOUS) ×1 IMPLANT
IRRIG SUCT STRYKERFLOW 2 WTIP (MISCELLANEOUS) ×1
IRRIGATION SUCT STRKRFLW 2 WTP (MISCELLANEOUS) ×1 IMPLANT
KIT PROCEDURE DVNC SI (MISCELLANEOUS) ×1 IMPLANT
KIT TURNOVER KIT A (KITS) IMPLANT
MANIFOLD NEPTUNE II (INSTRUMENTS) ×1 IMPLANT
NDL INSUFFLATION 14GA 120MM (NEEDLE) ×1 IMPLANT
NEEDLE INSUFFLATION 14GA 120MM (NEEDLE) ×1 IMPLANT
PACK CARDIOVASCULAR III (CUSTOM PROCEDURE TRAY) ×1 IMPLANT
PACK COLON (CUSTOM PROCEDURE TRAY) ×1 IMPLANT
PACK CYSTO (CUSTOM PROCEDURE TRAY) ×1 IMPLANT
PAD POSITIONING PINK XL (MISCELLANEOUS) ×1 IMPLANT
RELOAD STAPLE 60 3.5 BLU DVNC (STAPLE) IMPLANT
RELOAD STAPLE 60 4.3 GRN DVNC (STAPLE) IMPLANT
RELOAD STAPLER 3.5X60 BLU DVNC (STAPLE) ×1 IMPLANT
RELOAD STAPLER 4.3X60 GRN DVNC (STAPLE) IMPLANT
RETRACTOR WND ALEXIS 18 MED (MISCELLANEOUS) IMPLANT
RTRCTR WOUND ALEXIS 18CM MED (MISCELLANEOUS)
SCISSORS LAP 5X35 DISP (ENDOMECHANICALS) IMPLANT
SCISSORS MNPLR CVD DVNC XI (INSTRUMENTS) ×1 IMPLANT
SEAL UNIV 5-12 XI (MISCELLANEOUS) ×3 IMPLANT
SEALER VESSEL EXT DVNC XI (MISCELLANEOUS) ×1 IMPLANT
SOL ELECTROSURG ANTI STICK (MISCELLANEOUS) ×1
SOLUTION ELECTROSURG ANTI STCK (MISCELLANEOUS) ×1 IMPLANT
STAPLER 60 SUREFORM DVNC (STAPLE) IMPLANT
STAPLER ECHELON POWER CIR 29 (STAPLE) IMPLANT
STAPLER ECHELON POWER CIR 31 (STAPLE) IMPLANT
STAPLER RELOAD 3.5X60 BLU DVNC (STAPLE) ×1
STAPLER RELOAD 4.3X60 GRN DVNC (STAPLE)
STOPCOCK 4 WAY LG BORE MALE ST (IV SETS) ×2 IMPLANT
SUT ETHILON 2 0 PS N (SUTURE) IMPLANT
SUT NOVA NAB GS-21 1 T12 (SUTURE) ×2 IMPLANT
SUT PROLENE 2 0 KS (SUTURE) IMPLANT
SUT SILK 2 0 SH CR/8 (SUTURE) IMPLANT
SUT SILK 2-0 18XBRD TIE 12 (SUTURE) ×1 IMPLANT
SUT SILK 3 0 SH CR/8 (SUTURE) ×1 IMPLANT
SUT SILK 3-0 18XBRD TIE 12 (SUTURE) IMPLANT
SUT V-LOC BARB 180 2/0GR6 GS22 (SUTURE)
SUT VIC AB 2-0 SH 18 (SUTURE) IMPLANT
SUT VIC AB 2-0 SH 27X BRD (SUTURE) IMPLANT
SUT VIC AB 3-0 SH 18 (SUTURE) IMPLANT
SUT VIC AB 4-0 PS2 27 (SUTURE) ×2 IMPLANT
SUT VICRYL 0 UR6 27IN ABS (SUTURE) ×1 IMPLANT
SUTURE V-LC BRB 180 2/0GR6GS22 (SUTURE) IMPLANT
SYR 20ML ECCENTRIC (SYRINGE) ×1 IMPLANT
SYS LAPSCP GELPORT 120MM (MISCELLANEOUS)
SYS WOUND ALEXIS 18CM MED (MISCELLANEOUS)
SYSTEM LAPSCP GELPORT 120MM (MISCELLANEOUS) IMPLANT
SYSTEM WOUND ALEXIS 18CM MED (MISCELLANEOUS) IMPLANT
TOWEL OR 17X26 10 PK STRL BLUE (TOWEL DISPOSABLE) IMPLANT
TOWEL OR NON WOVEN STRL DISP B (DISPOSABLE) ×1 IMPLANT
TRAY FOLEY MTR SLVR 16FR STAT (SET/KITS/TRAYS/PACK) ×1 IMPLANT
TROCAR ADV FIXATION 5X100MM (TROCAR) ×1 IMPLANT
TUBE PU 8FR 16IN ENFIT (TUBING) IMPLANT
TUBING CONNECTING 10 (TUBING) ×3 IMPLANT
TUBING INSUFFLATION 10FT LAP (TUBING) ×1 IMPLANT
TUBING UROLOGY SET (TUBING) IMPLANT

## 2023-02-08 NOTE — Transfer of Care (Signed)
Immediate Anesthesia Transfer of Care Note  Patient: Israelle Heintzelman  Procedure(s) Performed: XI ROBOT ASSISTED LAPAROSCOPIC SIGMOIDECTOMY CYSTOSCOPY with FIREFLY INJECTION POSSIBLE CYSTOSCOPY WITH URETERAL STENT PLACEMENT  Patient Location: PACU  Anesthesia Type:General  Level of Consciousness: drowsy  Airway & Oxygen Therapy: Patient Spontanous Breathing and Patient connected to nasal cannula oxygen  Post-op Assessment: Report given to RN and Post -op Vital signs reviewed and stable  Post vital signs: Reviewed and stable  Last Vitals:  Vitals Value Taken Time  BP 139/61 02/08/23 1058  Temp    Pulse 78 02/08/23 1059  Resp 10 02/08/23 1059  SpO2 100 % 02/08/23 1059  Vitals shown include unfiled device data.  Last Pain:  Vitals:   02/08/23 0731  TempSrc:   PainSc: 4       Patients Stated Pain Goal: 5 (02/08/23 0731)  Complications: No notable events documented.

## 2023-02-08 NOTE — Brief Op Note (Signed)
02/08/2023  9:10 AM  PATIENT:  Willette Cluster  77 y.o. female  PRE-OPERATIVE DIAGNOSIS:  COLOVESICULAR FISTULA  POST-OPERATIVE DIAGNOSIS:  * No post-op diagnosis entered *  PROCEDURE:  Cysto with BILATERAL retrogrades / ICG injection  SURGEON:     Javarius Tsosie, Delbert Phenix., MD - Primary  PHYSICIAN ASSISTANT:   ASSISTANTS: none   ANESTHESIA:   general  EBL:  minimal   BLOOD ADMINISTERED:none  DRAINS:  Foley to gravity    LOCAL MEDICATIONS USED:  NONE  SPECIMEN:  No Specimen  DISPOSITION OF SPECIMEN:  N/A  COUNTS:  YES  TOURNIQUET:  * No tourniquets in log *  DICTATION: .Other Dictation: Dictation Number [id number]  PLAN OF CARE:  remain in OR2  PATIENT DISPOSITION:   per above   Delay start of Pharmacological VTE agent (>24hrs) due to surgical blood loss or risk of bleeding: yes

## 2023-02-08 NOTE — Discharge Instructions (Signed)
SURGERY: POST OP INSTRUCTIONS (Surgery for small bowel obstruction, colon resection, etc)   ######################################################################  EAT Gradually transition to a high fiber diet with a fiber supplement over the next few days after discharge  WALK Walk an hour a day.  Control your pain to do that.    CONTROL PAIN Control pain so that you can walk, sleep, tolerate sneezing/coughing, go up/down stairs.  HAVE A BOWEL MOVEMENT DAILY Keep your bowels regular to avoid problems.  OK to try a laxative to override constipation.  OK to use an antidairrheal to slow down diarrhea.  Call if not better after 2 tries  CALL IF YOU HAVE PROBLEMS/CONCERNS Call if you are still struggling despite following these instructions. Call if you have concerns not answered by these instructions  ######################################################################   DIET Follow a light diet the first few days at home.  Start with a bland diet such as soups, liquids, starchy foods, low fat foods, etc.  If you feel full, bloated, or constipated, stay on a ful liquid or pureed/blenderized diet for a few days until you feel better and no longer constipated. Be sure to drink plenty of fluids every day to avoid getting dehydrated (feeling dizzy, not urinating, etc.). Gradually add a fiber supplement to your diet over the next week.  Gradually get back to a regular solid diet.  Avoid fast food or heavy meals the first week as you are more likely to get nauseated. It is expected for your digestive tract to need a few months to get back to normal.  It is common for your bowel movements and stools to be irregular.  You will have occasional bloating and cramping that should eventually fade away.  Until you are eating solid food normally, off all pain medications, and back to regular activities; your bowels will not be normal. Focus on eating a low-fat, high fiber diet the rest of your life  (See Getting to Good Bowel Health, below).  CARE of your INCISION or WOUND  It is good for closed incisions and even open wounds to be washed every day.  Shower every day.  Short baths are fine.  Wash the incisions and wounds clean with soap & water.    You may leave closed incisions open to air if it is dry.   You may cover the incision with clean gauze & replace it after your daily shower for comfort.  STAPLES: You have skin staples.  Leave them in place & set up an appointment for them to be removed by a surgery office nurse ~10 days after surgery. = 1st week of January 2024    ACTIVITIES as tolerated Start light daily activities --- self-care, walking, climbing stairs-- beginning the day after surgery.  Gradually increase activities as tolerated.  Control your pain to be active.  Stop when you are tired.  Ideally, walk several times a day, eventually an hour a day.   Most people are back to most day-to-day activities in a few weeks.  It takes 4-8 weeks to get back to unrestricted, intense activity. If you can walk 30 minutes without difficulty, it is safe to try more intense activity such as jogging, treadmill, bicycling, low-impact aerobics, swimming, etc. Save the most intensive and strenuous activity for last (Usually 4-8 weeks after surgery) such as sit-ups, heavy lifting, contact sports, etc.  Refrain from any intense heavy lifting or straining until you are off narcotics for pain control.  You will have off days, but things should improve   week-by-week. DO NOT PUSH THROUGH PAIN.  Let pain be your guide: If it hurts to do something, don't do it.  Pain is your body warning you to avoid that activity for another week until the pain goes down. You may drive when you are no longer taking narcotic prescription pain medication, you can comfortably wear a seatbelt, and you can safely make sudden turns/stops to protect yourself without hesitating due to pain. You may have sexual intercourse when it  is comfortable. If it hurts to do something, stop.  MEDICATIONS Take your usually prescribed home medications unless otherwise directed.   Blood thinners:  Usually you can restart any strong blood thinners after the second postoperative day.  It is OK to take aspirin right away.     If you are on strong blood thinners (warfarin/Coumadin, Plavix, Xerelto, Eliquis, Pradaxa, etc), discuss with your surgeon, medicine PCP, and/or cardiologist for instructions on when to restart the blood thinner & if blood monitoring is needed (PT/INR blood check, etc).     PAIN CONTROL Pain after surgery or related to activity is often due to strain/injury to muscle, tendon, nerves and/or incisions.  This pain is usually short-term and will improve in a few months.  To help speed the process of healing and to get back to regular activity more quickly, DO THE FOLLOWING THINGS TOGETHER: Increase activity gradually.  DO NOT PUSH THROUGH PAIN Use Ice and/or Heat Try Gentle Massage and/or Stretching Take over the counter pain medication Take Narcotic prescription pain medication for more severe pain  Good pain control = faster recovery.  It is better to take more medicine to be more active than to stay in bed all day to avoid medications.  Increase activity gradually Avoid heavy lifting at first, then increase to lifting as tolerated over the next 6 weeks. Do not "push through" the pain.  Listen to your body and avoid positions and maneuvers than reproduce the pain.  Wait a few days before trying something more intense Walking an hour a day is encouraged to help your body recover faster and more safely.  Start slowly and stop when getting sore.  If you can walk 30 minutes without stopping or pain, you can try more intense activity (running, jogging, aerobics, cycling, swimming, treadmill, sex, sports, weightlifting, etc.) Remember: If it hurts to do it, then don't do it! Use Ice and/or Heat You will have swelling and  bruising around the incisions.  This will take several weeks to resolve. Ice packs or heating pads (6-8 times a day, 30-60 minutes at a time) will help sooth soreness & bruising. Some people prefer to use ice alone, heat alone, or alternate between ice & heat.  Experiment and see what works best for you.  Consider trying ice for the first few days to help decrease swelling and bruising; then, switch to heat to help relax sore spots and speed recovery. Shower every day.  Short baths are fine.  It feels good!  Keep the incisions and wounds clean with soap & water.   Try Gentle Massage and/or Stretching Massage at the area of pain many times a day Stop if you feel pain - do not overdo it Take over the counter pain medication This helps the muscle and nerve tissues become less irritable and calm down faster Choose ONE of the following over-the-counter anti-inflammatory medications: Acetaminophen 500mg tabs (Tylenol) 1-2 pills with every meal and just before bedtime (avoid if you have liver problems or if you have   acetaminophen in you narcotic prescription) Naproxen 220mg tabs (ex. Aleve, Naprosyn) 1-2 pills twice a day (avoid if you have kidney, stomach, IBD, or bleeding problems) Ibuprofen 200mg tabs (ex. Advil, Motrin) 3-4 pills with every meal and just before bedtime (avoid if you have kidney, stomach, IBD, or bleeding problems) Take with food/snack several times a day as directed for at least 2 weeks to help keep pain / soreness down & more manageable. Take Narcotic prescription pain medication for more severe pain A prescription for strong pain control is often given to you upon discharge (for example: oxycodone/Percocet, hydrocodone/Norco/Vicodin, or tramadol/Ultram) Take your pain medication as prescribed. Be mindful that most narcotic prescriptions contain Tylenol (acetaminophen) as well - avoid taking too much Tylenol. If you are having problems/concerns with the prescription medicine (does  not control pain, nausea, vomiting, rash, itching, etc.), please call us [phone] to see if we need to switch you to a different pain medicine that will work better for you and/or control your side effects better. If you need a refill on your pain medication, you must call the office before 4 pm and on weekdays only.  By federal law, prescriptions for narcotics cannot be called into a pharmacy.  They must be filled out on paper & picked up from our office by the patient or authorized caretaker.  Prescriptions cannot be filled after 4 pm nor on weekends.    WHEN TO CALL US [phone] Severe uncontrolled or worsening pain  Fever over 101 F (38.5 C) Concerns with the incision: Worsening pain, redness, rash/hives, swelling, bleeding, or drainage Reactions / problems with new medications (itching, rash, hives, nausea, etc.) Nausea and/or vomiting Difficulty urinating Difficulty breathing Worsening fatigue, dizziness, lightheadedness, blurred vision Other concerns If you are not getting better after two weeks or are noticing you are getting worse, contact our office [phone] for further advice.  We may need to adjust your medications, re-evaluate you in the office, send you to the emergency room, or see what other things we can do to help. The clinic staff is available to answer your questions during regular business hours (8:30am-5pm).  Please don't hesitate to call and ask to speak to one of our nurses for clinical concerns.    A surgeon from [facility] is always on call at the hospitals 24 hours/day If you have a medical emergency, go to the nearest emergency room or call 911.  FOLLOW UP in our office One the day of your discharge from the hospital (or the next business weekday), please call [facility] to set up or confirm an appointment to see your surgeon in the office for a follow-up appointment.  Usually it is 2-3 weeks after your surgery.   If you  have skin staples at your incision(s), let the office know so we can set up a time in the office for the nurse to remove them (usually around 10 days after surgery). Make sure that you call for appointments the day of discharge (or the next business weekday) from the hospital to ensure a convenient appointment time. IF YOU HAVE DISABILITY OR FAMILY LEAVE FORMS, BRING THEM TO THE OFFICE FOR PROCESSING.  DO NOT GIVE THEM TO YOUR DOCTOR.  [facility] [address], [address], [place], [place]  [address] ? [phone] - Main [phone] - Toll Free,  [phone] - Fax [email or web address]    GETTING TO GOOD BOWEL HEALTH. It is expected for your digestive tract to   need a few months to get back to normal.  It is common for your bowel movements and stools to be irregular.  You will have occasional bloating and cramping that should eventually fade away.  Until you are eating solid food normally, off all pain medications, and back to regular activities; your bowels will not be normal.   Avoiding constipation The goal: ONE SOFT BOWEL MOVEMENT A DAY!    Drink plenty of fluids.  Choose water first. TAKE A FIBER SUPPLEMENT EVERY DAY THE REST OF YOUR LIFE During your first week back home, gradually add back a fiber supplement every day Experiment which form you can tolerate.   There are many forms such as powders, tablets, wafers, gummies, etc Psyllium bran (Metamucil), methylcellulose (Citrucel), Miralax or Glycolax, Benefiber, Flax Seed.  Adjust the dose week-by-week (1/2 dose/day to 6 doses a day) until you are moving your bowels 1-2 times a day.  Cut back the dose or try a different fiber product if it is giving you problems such as diarrhea or bloating. Sometimes a laxative is needed to help jump-start bowels if constipated until the fiber supplement can help regulate your bowels.  If you are tolerating eating & you are farting, it is okay to try a gentle  laxative such as double dose MiraLax, prune juice, or Milk of Magnesia.  Avoid using laxatives too often. Stool softeners can sometimes help counteract the constipating effects of narcotic pain medicines.  It can also cause diarrhea, so avoid using for too long. If you are still constipated despite taking fiber daily, eating solids, and a few doses of laxatives, call our office. Controlling diarrhea Try drinking liquids and eating bland foods for a few days to avoid stressing your intestines further. Avoid dairy products (especially milk & ice cream) for a short time.  The intestines often can lose the ability to digest lactose when stressed. Avoid foods that cause gassiness or bloating.  Typical foods include beans and other legumes, cabbage, broccoli, and dairy foods.  Avoid greasy, spicy, fast foods.  Every person has some sensitivity to other foods, so listen to your body and avoid those foods that trigger problems for you. Probiotics (such as active yogurt, Align, etc) may help repopulate the intestines and colon with normal bacteria and calm down a sensitive digestive tract Adding a fiber supplement gradually can help thicken stools by absorbing excess fluid and retrain the intestines to act more normally.  Slowly increase the dose over a few weeks.  Too much fiber too soon can backfire and cause cramping & bloating. It is okay to try and slow down diarrhea with a few doses of antidiarrheal medicines.   Bismuth subsalicylate (ex. Kayopectate, Pepto Bismol) for a few doses can help control diarrhea.  Avoid if pregnant.   Loperamide (Imodium) can slow down diarrhea.  Start with one tablet (2mg) first.  Avoid if you are having fevers or severe pain.  ILEOSTOMY PATIENTS WILL HAVE CHRONIC DIARRHEA since their colon is not in use.    Drink plenty of liquids.  You will need to drink even more glasses of water/liquid a day to avoid getting dehydrated. Record output from your ileostomy.  Expect to empty  the bag every 3-4 hours at first.  Most people with a permanent ileostomy empty their bag 4-6 times at the least.   Use antidiarrheal medicine (especially Imodium) several times a day to avoid getting dehydrated.  Start with a dose at bedtime & breakfast.  Adjust up or   down as needed.  Increase antidiarrheal medications as directed to avoid emptying the bag more than 8 times a day (every 3 hours). Work with your wound ostomy nurse to learn care for your ostomy.  See ostomy care instructions. TROUBLESHOOTING IRREGULAR BOWELS 1) Start with a soft & bland diet. No spicy, greasy, or fried foods.  2) Avoid gluten/wheat or dairy products from diet to see if symptoms improve. 3) Miralax 17gm or flax seed mixed in 8oz. water or juice-daily. May use 2-4 times a day as needed. 4) Gas-X, Phazyme, etc. as needed for gas & bloating.  5) Prilosec (omeprazole) over-the-counter as needed 6)  Consider probiotics (Align, Activa, etc) to help calm the bowels down  Call your doctor if you are getting worse or not getting better.  Sometimes further testing (cultures, endoscopy, X-ray studies, CT scans, bloodwork, etc.) may be needed to help diagnose and treat the cause of the diarrhea. [facility] [address], [address], [place], [place]  [address] [phone] - Main.    [phone]  - Toll Free.   [phone] - Fax [email or web address]   ###############################   #######################################################  Ostomy Support Information  You've heard that people get along just fine with only one of their eyes, or one of their lungs, or one of their kidneys. But you also know that you have only one intestine and only one bladder, and that leaves you feeling awfully empty, both physically and emotionally: You think no other people go around without part of their intestine with the ends of their intestines sticking out through their abdominal walls.    YOU ARE NOT ALONE.  There are nearly three quarters of a million people in the [place] who have an ostomy; people who have had surgery to remove all or part of their colons or bladders.   There is even a national association, the [employer] with over 350 local affiliated support groups that are organized by volunteers who provide peer support and counseling. [employer] has a toll free telephone num-ber, 800-826-0826 and an educational, interactive website, www.ostomy.org   An ostomy is an opening in the belly (abdominal wall) made by surgery. Ostomates are people who have had this procedure. The opening (stoma) allows the kidney or bowel to grdischarge waste. An external pouch covers the stoma to collect waste. Pouches are are a simple bag and are odor free. Different companies have disposable or reusable pouches to fit one's lifestyle. An ostomy can either be temporary or permanent.   THERE ARE THREE MAIN TYPES OF OSTOMIES Colostomy. A colostomy is a surgically created opening in the large intestine (colon). Ileostomy. An ileostomy is a surgically created opening in the small intestine. Urostomy. A urostomy is a surgically created opening to divert urine away from the bladder.  OSTOMY Care  The following guidelines will make care of your colostomy easier. Keep this information close by for quick reference.  Helpful DIET hints Eat a well-balanced diet including vegetables and fresh fruits. Eat on a regular schedule.  Drink at least 6 to 8 glasses of fluids daily. Eat slowly in a relaxed atmosphere. Chew your food thoroughly. Avoid chewing gum, smoking, and drinking from a straw. This will help decrease the amount of air you swallow, which may help reduce gas. Eating yogurt or drinking buttermilk may help reduce gas.  To control gas at night, do not eat after 8 p.m. This will give your bowel time to quiet down before you go   to bed.  If gas is a problem, you can purchase  Beano. Sprinkle Beano on the first bite of food before eating to reduce gas. It has no flavor and should not change the taste of your food. You can buy Beano over the counter at your local drugstore.  Foods like fish, onions, garlic, broccoli, asparagus, and cabbage produce odor. Although your pouch is odor-proof, if you eat these foods you may notice a stronger odor when emptying your pouch. If this is a concern, you may want to limit these foods in your diet.  If you have an ileostomy, you will have chronic diarrhea & need to drink more liquids to avoid getting dehydrated.  Consider antidiarrheal medicine like imodium (loperamide) or Lomotil to help slow down bowel movements / diarrhea into your ileostomy bag.  GETTING TO GOOD BOWEL HEALTH WITH AN ILEOSTOMY    With the colon bypassed & not in use, you will have small bowel diarrhea.   It is important to thicken & slow your bowel movements down.   The goal: 4-6 small BOWEL MOVEMENTS A DAY It is important to drink plenty of liquids to avoid getting dehydrated  CONTROLLING ILEOSTOMY DIARRHEA  TAKE A FIBER SUPPLEMENT (FiberCon or Benefiner soluble fiber) twice a day - to thicken stools by absorbing excess fluid and retrain the intestines to act more normally.  Slowly increase the dose over a few weeks.  Too much fiber too soon can backfire and cause cramping & bloating.  TAKE AN IRON SUPPLEMENT twice a day to naturally constipate your bowels.  Usually ferrous sulfate 325mg twice a day)  TAKE ANTI-DIARRHEAL MEDICINES: Loperamide (Imodium) can slow down diarrhea.  Start with two tablets (= 4mg) first and then try one tablet every 6 hours.  Can go up to 2 pills four times day (8 pills of 2mg max) Avoid if you are having fevers or severe pain.  If you are not better or start feeling worse, stop all medicines and call your doctor for advice LoMotil (Diphenoxylate / Atropine) is another medicine that can constipate & slow down bowel moevements Pepto  Bismol (bismuth) can gently thicken bowels as well  If diarrhea is worse,: drink plenty of liquids and try simpler foods for a few days to avoid stressing your intestines further. Avoid dairy products (especially milk & ice cream) for a short time.  The intestines often can lose the ability to digest lactose when stressed. Avoid foods that cause gassiness or bloating.  Typical foods include beans and other legumes, cabbage, broccoli, and dairy foods.  Every person has some sensitivity to other foods, so listen to our body and avoid those foods that trigger problems for you.Call your doctor if you are getting worse or not better.  Sometimes further testing (cultures, endoscopy, X-ray studies, bloodwork, etc) may be needed to help diagnose and treat the cause of the diarrhea. Take extra anti-diarrheal medicines (maximum is 8 pills of 2mg loperamide a day)   Tips for POUCHING an OSTOMY   Changing Your Pouch The best time to change your pouch is in the morning, before eating or drinking anything. Your stoma can function at any time, but it will function more after eating or drinking.   Applying the pouching system  Place all your equipment close at hand before removing your pouch.  Wash your hands.  Stand or sit in front of a mirror. Use the position that works best for you. Remember that you must keep the skin around the stoma   wrinkle-free for a good seal.  Gently remove the used pouch (1-piece system) or the pouch and old wafer (2-piece system). Empty the pouch into the toilet. Save the closure clip to use again.  Wash the stoma itself and the skin around the stoma. Your stoma may bleed a little when being washed. This is normal. Rinse and pat dry. You may use a wash cloth or soft paper towels (like Bounty), mild soap (like Dial, Safeguard, or Ivory), and water. Avoid soaps that contain perfumes or lotions.  For a new pouch (1-piece system) or a new wafer (2-piece system), measure your  stoma using the stoma guide in each box of supplies.  Trace the shape of your stoma onto the back of the new pouch or the back of the new wafer. Cut out the opening. Remove the paper backing and set it aside.  Optional: Apply a skin barrier powder to surrounding skin if it is irritated (bare or weeping), and dust off the excess. Optional: Apply a skin-prep wipe (such as Skin Prep or All-Kare) to the skin around the stoma, and let it dry. Do not apply this solution if the skin is irritated (red, tender, or broken) or if you have shaved around the stoma. Optional: Apply a skin barrier paste (such as Stomahesive, Coloplast, or Premium) around the opening cut in the back of the pouch or wafer. Allow it to dry for 30 to 60 seconds.  Hold the pouch (1-piece system) or wafer (2-piece system) with the sticky side toward your body. Make sure the skin around the stoma is wrinkle-free. Center the opening on the stoma, then press firmly to your abdomen (Fig. 4). Look in the mirror to check if you are placing the pouch, or wafer, in the right position. For a 2-piece system, snap the pouch onto the wafer. Make sure it snaps into place securely.  Place your hand over the stoma and the pouch or wafer for about 30 seconds. The heat from your hand can help the pouch or wafer stick to your skin.  Add deodorant (such as Super Banish or Nullo) to your pouch. Other options include food extracts such as vanilla oil and peppermint extract. Add about 10 drops of the deodorant to the pouch. Then apply the closure clamp. Note: Do not use toxic  chemicals or commercial cleaning agents in your pouch. These substances may harm the stoma.  Optional: For extra seal, apply tape to all 4 sides around the pouch or wafer, as if you were framing a picture. You may use any brand of medical adhesive tape. Change your pouch every 5 to 7 days. Change it immediately if a leak occurs.  Wash your hands afterwards.  If you are wearing a  2-piece system, you may use 2 new pouches per week and alternate them. Rinse the pouch with mild soap and warm water and hang it to dry for the next day. Apply the fresh pouch. Alternate the 2 pouches like this for a week. After a week, change the wafer and begin with 2 new pouches. Place the old pouches in a plastic bag, and put them in the trash.   LIVING WITH AN OSTOMY  Emptying Your Pouch Empty your pouch when it is one-third full (of urine, stool, and/or gas). If you wait until your pouch is fuller than this, it will be more difficult to empty and more noticeable. When you empty your pouch, either put toilet paper in the toilet bowl first, or flush the   toilet while you empty the pouch. This will reduce splashing. You can empty the pouch between your legs or to one side while sitting, or while standing or stooping. If you have a 2-piece system, you can snap off the pouch to empty it. Remember that your stoma may function during this time. If you wish to rinse your pouch after you empty it, a turkey baster can be helpful. When using a baster, squirt water up into the pouch through the opening at the bottom. With a 2-piece system, you can snap off the pouch to rinse it. After rinsing  your pouch, empty it into the toilet. When rinsing your pouch at home, put a few granules of Dreft soap in the rinse water. This helps lubricate and freshen your pouch. The inside of your pouch can be sprayed with non-stick cooking oil (Pam spray). This may help reduce stool sticking to the inside of the pouch.  Bathing You may shower or bathe with your pouch on or off. Remember that your stoma may function during this time.  The materials you use to wash your stoma and the skin around it should be clean, but they do not need to be sterile.  Wearing Your Pouch During hot weather, or if you perspire a lot in general, wear a cover over your pouch. This may prevent a rash on your skin under the pouch. Pouch covers are  sold at ostomy supply stores. Wear the pouch inside your underwear for better support. Watch your weight. Any gain or loss of 10 to 15 pounds or more can change the way your pouch fits.  Going Away From Home A collapsible cup (like those that come in travel kits) or a soft plastic squirt bottle with a pull-up top (like a travel bottle for shampoo) can be used for rinsing your pouch when you are away from home. Tilt the opening of the pouch at an upward angle when using a cup to rinse.  Carry wet wipes or extra tissues to use in public bathrooms.  Carry an extra pouching system with you at all times.  Never keep ostomy supplies in the glove compartment of your car. Extreme heat or cold can damage the skin barriers and adhesive wafers on the pouch.  When you travel, carry your ostomy supplies with you at all times. Keep them within easy reach. Do not pack ostomy supplies in baggage that will be checked or otherwise separated from you, because your baggage might be lost. If you're traveling out of the country, it is helpful to have a letter stating that you are carrying ostomy supplies as a medical necessity.  If you need ostomy supplies while traveling, look in the yellow pages of the telephone book under "Surgical Supplies." Or call the local ostomy organization to find out where supplies are available.  Do not let your ostomy supplies get low. Always order new pouches before you use the last one.  Reducing Odor Limit foods such as broccoli, cabbage, onions, fish, and garlic in your diet to help reduce odor. Each time you empty your pouch, carefully clean the opening of the pouch, both inside and outside, with toilet paper. Rinse your pouch 1 or 2 times daily after you empty it (see directions for emptying your pouch and going away from home). Add deodorant (such as Super Banish or Nullo) to your pouch. Use air deodorizers in your bathroom. Do not add aspirin to your pouch. Even though  aspirin can help prevent odor, it   could cause ulcers on your stoma.  When to call the doctor Call the doctor if you have any of the following symptoms: Purple, black, or white stoma Severe cramps lasting more than 6 hours Severe watery discharge from the stoma lasting more than 6 hours No output from the colostomy for 3 days Excessive bleeding from your stoma Swelling of your stoma to more than 1/2-inch larger than usual Pulling inward of your stoma below skin level Severe skin irritation or deep ulcers Bulging or other changes in your abdomen  When to call your ostomy nurse Call your ostomy/enterostomal therapy (WOCN) nurse if any of the following occurs: Frequent leaking of your pouching system Change in size or appearance of your stoma, causing discomfort or problems with your pouch Skin rash or rawness Weight gain or loss that causes problems with your pouch     FREQUENTLY ASKED QUESTIONS   Why haven't you met any of these folks who have an ostomy?  Well, maybe you have! You just did not recognize them because an ostomy doesn't show. It can be kept secret if you wish. Why, maybe some of your best friends, office associates or neighbors have an ostomy ... you never can tell. People facing ostomy surgery have many quality-of-life questions like: Will you bulge? Smell? Make noises? Will you feel waste leaving your body? Will you be a captive of the toilet? Will you starve? Be a social outcast? Get/stay married? Have babies? Easily bathe, go swimming, bend over?  OK, let's look at what you can expect:   Will you bulge?  Remember, without part of the intestine or bladder, and its contents, you should have a flatter tummy than before. You can expect to wear, with little exception, what you wore before surgery ... and this in-cludes tight clothing and bathing suits.   Will you smell?  Today, thanks to modern odor proof pouching systems, you can walk into an ostomy support group  meeting and not smell anything that is foul or offensive. And, for those with an ileostomy or colostomy who are concerned about odor when emptying their pouch, there are in-pouch deodorants that can be used to eliminate any waste odors that may exist.   Will you make noises?  Everyone produces gas, especially if they are an air-swallower. But intestinal sounds that occur from time to time are no differ-ent than a gurgling tummy, and quite often your clothing will muffle any sounds.   Will you feel the waste discharges?  For those with a colostomy or ileostomy there might be a slight pressure when waste leaves your body, but understand that the intestines have no nerve endings, so there will be no unpleasant sensations. Those with a urostomy will probably be unaware of any kidney drainage.   Will you be a captive of the toilet?  Immediately post-op you will spend more time in the bathroom than you will after your body recovers from surgery. Every person is different, but on average those with an ileostomy or urostomy may empty their pouches 4 to 6 times a day; a little  less if you have a colostomy. The average wear time between pouch system changes is 3 to 5 days and the changing process should take less than 30 minutes.   Will I need to be on a special diet? Most people return to their normal diet when they have recovered from surgery. Be sure to chew your food well, eat a well-balanced diet and drink plenty of fluids. If   you experience problems with a certain food, wait a couple of weeks and try it again.  Will there be odor and noises? Pouching systems are designed to be odor-proof or odor-resistant. There are deodorants that can be used in the pouch. Medications are also available to help reduce odor. Limit gas-producing foods and carbonated beverages. You will experience less gas and fewer noises as you heal from surgery.  How much time will it take to care for my ostomy? At first, you may  spend a lot of time learning about your ostomy and how to take care of it. As you become more comfortable and skilled at changing the pouching system, it will take very little time to care for it.   Will I be able to return to work? People with ostomies can perform most jobs. As soon as you have healed from surgery, you should be able to return to work. Heavy lifting (more than 10 pounds) may be discouraged.   What about intimacy? Sexual relationships and intimacy are important and fulfilling aspects of your life. They should continue after ostomy surgery. Intimacy-related concerns should be discussed openly between you and your partner.   Can I wear regular clothing? You do not need to wear special clothing. Ostomy pouches are fairly flat and barely noticeable. Elastic undergarments will not hurt the stoma or prevent the ostomy from functioning.   Can I participate in sports? An ostomy should not limit your involvement in sports. Many people with ostomies are runners, skiers, swimmers or participate in other active lifestyles. Talk with your caregiver first before doing heavy physical activity.  Will you starve?  Not if you follow doctor's orders at each stage of your post-op adjustment. There is no such thing as an "ostomy diet". Some people with an ostomy will be able to eat and tolerate anything; others may find diffi-culty with some foods. Each person is an individual and must determine, by trial, what is best for them. A good practice for all is to drink plenty of water.   Will you be a social outcast?  Have you met anyone who has an ostomy and is a social outcast? Why should you be the first? Only your attitude and self image will effect how you are treated. No confi-dent person is an outcast.    PROFESSIONAL HELP   Resources are available if you need help or have questions about your ostomy.   Specially trained nurses called Wound, Ostomy Continence Nurses (WOCN) are available for  consultation in most major medical centers.  Consider getting an ostomy consult at an outpatient ostomy clinic.   [facility] has an Ostomy Clinic run by an WOCN ostomy nurse at the [facility].  [phone]. [facility] can help set up an appointment   The [employer] Ostomy Association (UOA) is a group made up of many local chapters throughout the [place]. These local groups hold meetings and provide support to prospective and existing ostomates. They sponsor educational events and have qualified visitors to make personal or telephone visits. Contact the UOA for the chapter nearest you and for other educational publications.  More detailed information can be found in Colostomy Guide, a publication of the United Ostomy Association (UOA). Contact UOA at 1-800-826-0826 or visit their web site at www.uoaa.org. The website contains links to other sites, suppliers and resources.  [facility] Secure Start Services: Start at the website to enlist for support.  Your Wound Ostomy (WOCN) nurse may have started this   process. [email or web address]://[email or web address].[email or web address]/securestart Secure Start services are designed to support people as they live their lives with an ostomy or neurogenic bladder. Enrolling is easy and at no cost to the patient. We realize that each person's needs and life journey are different. Through Secure Start services, we want to help people live their life, their way.  #######################################################  

## 2023-02-08 NOTE — H&P (Signed)
PROVIDER:  Elenora Gamma, MD   MRN: [id number] DOB: Dec 04, 1945   Subjective   Chief Complaint: recheck (colovaginal fistula,)       History of Present Illness: Faith Massey is a 77 y.o. female who is seen today as an office consultation at the request of Dr. 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 Patient seen by primary care physician with concern of air in her bladder.  She denies any pain or blood in her urine.  She denies any difficulty with bowel habits.  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  She has a history of a stroke, CAD and a remote history of breast cancer.  Colonoscopy was repeated recently and did not show a malignancy. 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  She was recently hospitalized with severe pyelonephritis.      Review of Systems: A complete review of systems was obtained from the patient.  I have reviewed this information and discussed as appropriate with the patient.  See HPI as well for other ROS.       Medical History: Past Medical History Past Medical History: DiagnosisDate            Arthritis                         Breast cancer (CMS/HHS-HCC)                     CVA (cerebral vascular accident) (CMS/HHS-HCC)              Hypertension                Myasthenia gravis (CMS/HHS-HCC)              Thymoma                Problem List Patient Active Problem List Diagnosis Colovesical fistula CVA (cerebral vascular accident) (CMS/HHS-HCC) HTN (hypertension) Myasthenic syndrome (CMS/HHS-HCC) Prediabetes Type B2 thymoma (CMS/HHS-HCC)       Past Surgical History Past Surgical History: ProcedureLateralityDate            MASS REMOVAL                   04/25/2022            BACK SURGERY                                BREAST SURGERY   Bilateral                         DOUBLE MASTY        Allergies Allergies AllergenReactions PrednisoneDermatitis, Palpitations, Shortness Of Breath and Swelling                         Patient reports having an intolerance to medication. Reports medication caused swelling and metallic taste in mouth.       Medications Ordered Prior to Encounter Current Outpatient Medications on File Prior to Visit MedicationSigDispenseRefill            apixaban (ELIQUIS) 5 mg tablet        Take 5 mg by mouth 2 (two) times daily  aspirin 81 MG EC tablet         Take 81 mg by mouth once daily                                cephalexin (KEFLEX) 250 MG capsule                                               cholecalciferol, vitamin D3, (VITAMIN D3) 125 mcg (5,000 unit) tablet        Take 5,000 Units by mouth once daily                                COLLAGEN MISC      Take by mouth                                     lisinopriL (ZESTRIL) 40 MG tablet     Take 40 mg by mouth once daily                                multivitamin with minerals tablet         Take 1 tablet by mouth once daily                               pyRIDostigmine (MESTINON) 60 mg tablet   Take 60 mg by mouth 3 (three) times daily                               traMADoL (ULTRAM) 50 mg tablet    Take by mouth                              No current facility-administered medications on file prior to visit.       Family History History reviewed. No pertinent family history.     Tobacco Use History Social History     Tobacco Use Smoking StatusNever Smokeless TobaccoNever       Social History Social History     Socioeconomic History Marital status:Divorced Tobacco Use Smoking status:Never Smokeless tobacco:Never Vaping Use Vaping status:Never Used Substance and Sexual Activity Alcohol use:Not Currently Drug HQI:ONGEX     Social Determinants of Health     Financial Resource Strain: Medium Risk (06/21/2022)             Received from [facility]             Overall Financial Resource Strain (CARDIA)                        Difficulty of Paying Living Expenses: Somewhat hard Food Insecurity: Food Insecurity Present (11/11/2022)             [facility]             Hunger Vital Sign  Worried About Programme researcher, broadcasting/film/video in the Last Year: Sometimes true                        [employer] of Food in the Last Year: Never true Transportation Needs: No Transportation Needs (11/11/2022)             Received from [facility] - Transportation                        Lack of Transportation (Medical): No                        Lack of Transportation (Non-Medical): No Physical Activity: Insufficiently Active (06/21/2022)             Received from [facility]             Exercise Vital Sign                        Days of Exercise per Week: 5 days                        Minutes of Exercise per Session: 20 min Stress: Stress Concern Present (06/21/2022)             Received from [facility] - Occupational Stress Questionnaire                        Feeling of Stress : To some extent Social Connections: Moderately Integrated (06/21/2022)             Received from [facility]             Social Connection and Isolation Panel [NHANES]                        Frequency of Communication with Friends and Family: More than three times a week                        Frequency of Social Gatherings with Friends and Family: More than three times a week                        Attends Religious Services: More than 4 times per year                        Active Member of [employer] or Organizations: Yes                        Attends Club or Organization Meetings: More than 4 times per year                        Marital Status: Divorced       Objective:     Vitals:   02/08/23 0643  BP: (!) 145/73  Pulse: 80  Resp: 16  Temp: 98.6 F (37 C)  SpO2: 98%       Exam Gen: NAD CV: RRR Pulm: CTA Abd: soft      Labs, Imaging and Diagnostic Testing:  CT reviewed       Assessment and Plan: Diagnoses and all orders for this visit:   Colovesical fistula     77 year old female who presents to the office after complete workup of a colovesicular fistula.  Colonoscopy reveals no malignancy at the site.  I have recommended robotic assisted partial colectomy.  Upon review of the CT scan this appears to be close to the left ureteral opening into the bladder.  I have contacted a colleague urologist, Dr Berneice Heinrich to evaluate this CT further and determine a course of action for surgical treatment.  Once I hear back from him, we will plan the appropriate surgery.   The surgery and anatomy were described to the patient as well as the risks of surgery and the possible complications.  These include: Bleeding, deep abdominal infections and possible wound complications such as hernia and infection, damage to adjacent structures, leak of surgical connections, which can lead to other surgeries and possibly an ostomy, possible need for other procedures, such as abscess drains in radiology, possible prolonged hospital stay, possible diarrhea from removal of part of the colon, possible constipation from narcotics, possible bowel, bladder or sexual dysfunction if having rectal surgery, prolonged fatigue/weakness or appetite loss, possible early recurrence of of disease, possible complications of their medical problems such as heart disease or arrhythmias or lung problems, death (less than 1%). I believe the patient understands and wishes to proceed with the surgery.    Vanita Panda, MD Colon and Rectal Surgery [facility]

## 2023-02-08 NOTE — Op Note (Signed)
02/08/2023  10:41 AM  PATIENT:  Faith Massey  77 y.o. female  Patient Care Team: Copland, Gwenlyn Found, MD as PCP - General (Family Medicine) Myna Hidalgo Rose Phi, MD as Medical Oncologist (Oncology)  PRE-OPERATIVE DIAGNOSIS:  COLOVESICULAR FISTULA  POST-OPERATIVE DIAGNOSIS:  DIVERTICULAR DISEASE  PROCEDURE:  XI ROBOT ASSISTED SIGMOIDECTOMY CYSTOSCOPY with FIREFLY INJECTION    Surgeon(s): Romie Levee, MD Andria Meuse, MD Berneice Heinrich Delbert Phenix., MD  ASSISTANT: Dr Cliffton Asters   ANESTHESIA:   local and general  EBL: 50ml Total I/O In: 750 [I.V.:400; IV Piggyback:350] Out: 400 [Urine:350; Blood:50]  Delay start of Pharmacological VTE agent (>24hrs) due to surgical blood loss or risk of bleeding:  no  DRAINS: none   SPECIMEN:  Source of Specimen:  Rectosigmoid colon  DISPOSITION OF SPECIMEN:  PATHOLOGY  COUNTS:  YES  PLAN OF CARE: Admit to inpatient   PATIENT DISPOSITION:  PACU - hemodynamically stable.  INDICATION:    77 y.o. F with h/o perforated Sigmoid colon and concern for colovesical fistula.  I recommended segmental resection:  The anatomy & physiology of the digestive tract was discussed.  The pathophysiology was discussed.  Natural history risks without surgery was discussed.   I worked to give an overview of the disease and the frequent need to have multispecialty involvement.  I feel the risks of no intervention will lead to serious problems that outweigh the operative risks; therefore, I recommended a partial colectomy to remove the pathology.  Laparoscopic & open techniques were discussed.   Risks such as bleeding, infection, abscess, leak, reoperation, possible ostomy, hernia, heart attack, death, and other risks were discussed.  I noted a good likelihood this will help address the problem.   Goals of post-operative recovery were discussed as well.    The patient expressed understanding & wished to proceed with surgery.  OR FINDINGS:   Patient had minimal  pelvic inflammation.  No signs of colovesical fistula  The anastomosis rests ~10 cm from the anal verge by rigid proctoscopy.  DESCRIPTION:   Informed consent was confirmed.  The patient underwent general anaesthesia without difficulty.  The patient was positioned appropriately.  VTE prevention in place.  The patient's abdomen was clipped, prepped, & draped in a sterile fashion.  Surgical timeout confirmed our plan.  The patient was positioned in reverse Trendelenburg.  Abdominal entry was gained using a Varies needle in the LUQ.  Entry was clean.  I induced carbon dioxide insufflation.  An 8mm robotic port was placed in the RUQ.  Camera inspection revealed no injury.  Extra ports were carefully placed under direct laparoscopic visualization.  I laparoscopically reflected the greater omentum and the upper abdomen the small bowel in the upper abdomen. The patient was appropriately positioned and the robot was docked to the patient's left side.  Instruments were placed under direct visualization.    I mobilized the sigmoid colon off of the pelvic sidewall. There were minimal adhesions up to the pelvic brim. I scored the base of peritoneum of the right side of the mesentery of the left colon from the ligament of Treitz to the peritoneal reflection of the mid rectum.  The patient had some adhesions to the anterior peritoneum that were taken down.  I elevated the sigmoid mesentery and enetered into the retro-mesenteric plane. We were able to identify the left ureter and gonadal vessels. We kept those posterior within the retroperitoneum and elevated the left colon mesentery off that. I did isolated IMA pedicle but did not ligate  it yet.  I continued distally and got into the avascular plane posterior to the mesorectum. This allowed me to help mobilize the rectum as well by freeing the mesorectum off the sacrum.  I mobilized the peritoneal coverings towards the peritoneal reflection on both the right and left  sides of the rectum.  I could see the right and left ureters and stayed away from them. There was no signs of fistula to the bladder.   I skeletonized the inferior mesenteric artery pedicle.  After confirming the left ureter was out of the way, I went ahead and ligated the inferior mesenteric artery pedicle with bipolar robotic vessel sealer well above its takeoff from the aorta.  We ensured hemostasis. I skeletonized the mesorectum at the junction at the proximal rectum using blunt dissection & bipolar robotic vessel sealer.  I mobilized the left colon in a lateral to medial fashion off the line of Toldt up towards the splenic flexure to ensure good mobilization of the left colon to reach into the pelvis.  I divided the colon mesentery up to the level of the descending sigmoid colon junction.  2.5 mg of firefly was injected intravascularly to confirm good perfusion of the remaining colon and rectum.  Once this was confirmed, a blue load 60 mm stapler was used to transect the rectosigmoid junction.  We inspected for hemostasis.  There was no active bleeding noted.  The robot was then undocked.  The 12 mm suprapubic port was enlarged to a Pfannenstiel incision and an Alexis wound protector was placed.  The colon was brought out through this and transected proximally over a pursestring device and 2-0 Prolene pursestring.  This was secured with interrupted 3-0 silk sutures.  A 29 mm EEA anvil was inserted and the pursestring was tied tightly around this.  This was then placed back into the abdomen.  The EEA stapler was brought out through the end of the rectal stump and an anastomosis was created under laparoscopic visualization.  There was no tension noted on the anastomosis.  There was no leak when tested with insufflation under irrigation.  There was no active bleeding noted at the staple line during endoscopy.  Once this was complete, the abdomen was irrigated with normal saline.  We then switched to clean  gowns, drapes, instruments and gloves.    The peritoneum of the Pfannenstiel incision was closed using a running 0 Vicryl suture.  The fascia was closed using 2, #1 Novafil running sutures.  The subcutaneous tissue was reapproximated using a running 2-0 Vicryl suture.  The dermal layer was then closed using a running 4-0 Vicryl subcuticular suture.  A sterile dressing was applied.  The port sites were closed using interrupted 4-0 Vicryl suture and Dermabond.  The patient was awakened from anesthesia and sent to the postanesthesia care unit in stable condition.  All counts were correct per operating room staff.  An MD assistant was necessary for tissue manipulation, retraction and positioning due to the complexity of the case and hospital policies   Vanita Panda, MD  Colorectal and General Surgery [facility] Surgery

## 2023-02-08 NOTE — Anesthesia Postprocedure Evaluation (Signed)
Anesthesia Post Note  Patient: Faith Massey  Procedure(s) Performed: XI ROBOT ASSISTED LAPAROSCOPIC SIGMOIDECTOMY CYSTOSCOPY with FIREFLY INJECTION POSSIBLE CYSTOSCOPY WITH URETERAL STENT PLACEMENT     Patient location during evaluation: PACU Anesthesia Type: General Level of consciousness: oriented, patient cooperative and sedated Pain management: pain level controlled Vital Signs Assessment: post-procedure vital signs reviewed and stable Respiratory status: spontaneous breathing, nonlabored ventilation, respiratory function stable and patient connected to nasal cannula oxygen Cardiovascular status: blood pressure returned to baseline and stable Postop Assessment: no apparent nausea or vomiting Anesthetic complications: no   No notable events documented.  Last Vitals:  Vitals:   02/08/23 1145 02/08/23 1200  BP: (!) 146/69   Pulse: 89   Resp: 16   Temp: (!) 36.4 C   SpO2: 97% 93%    Last Pain:  Vitals:   02/08/23 1145  TempSrc:   PainSc: 0-No pain                 Melven Stockard,E. Kensly Bowmer

## 2023-02-08 NOTE — Anesthesia Procedure Notes (Signed)
Procedure Name: Intubation Date/Time: 02/08/2023 8:49 AM  Performed by: Nathen May, CRNAPre-anesthesia Checklist: Patient identified, Emergency Drugs available, Suction available and Patient being monitored Patient Re-evaluated:Patient Re-evaluated prior to induction Oxygen Delivery Method: Circle System Utilized Preoxygenation: Pre-oxygenation with 100% oxygen Induction Type: IV induction Ventilation: Mask ventilation without difficulty Laryngoscope Size: Mac and 3 Grade View: Grade II Tube type: Oral Tube size: 7.0 mm Number of attempts: 1 Airway Equipment and Method: Stylet and Oral airway Placement Confirmation: ETT inserted through vocal cords under direct vision, positive ETCO2 and breath sounds checked- equal and bilateral Secured at: 21 cm Tube secured with: Tape Dental Injury: Teeth and Oropharynx as per pre-operative assessment

## 2023-02-09 ENCOUNTER — Encounter (HOSPITAL_COMMUNITY): Payer: Self-pay | Admitting: General Surgery

## 2023-02-09 LAB — CBC
HCT: 33.7 % — ABNORMAL LOW (ref 36.0–46.0)
Hemoglobin: 10.8 g/dL — ABNORMAL LOW (ref 12.0–15.0)
MCH: 32.6 pg (ref 26.0–34.0)
MCHC: 32 g/dL (ref 30.0–36.0)
MCV: 101.8 fL — ABNORMAL HIGH (ref 80.0–100.0)
Platelets: 213 10*3/uL (ref 150–400)
RBC: 3.31 MIL/uL — ABNORMAL LOW (ref 3.87–5.11)
RDW: 13.8 % (ref 11.5–15.5)
WBC: 11.6 10*3/uL — ABNORMAL HIGH (ref 4.0–10.5)
nRBC: 0 % (ref 0.0–0.2)

## 2023-02-09 LAB — BASIC METABOLIC PANEL
Anion gap: 9 (ref 5–15)
BUN: 13 mg/dL (ref 8–23)
CO2: 22 mmol/L (ref 22–32)
Calcium: 8.8 mg/dL — ABNORMAL LOW (ref 8.9–10.3)
Chloride: 108 mmol/L (ref 98–111)
Creatinine, Ser: 0.75 mg/dL (ref 0.44–1.00)
GFR, Estimated: >60 mL/min (ref >60)
Glucose, Bld: 111 mg/dL — ABNORMAL HIGH (ref 70–99)
Potassium: 4 mmol/L (ref 3.5–5.1)
Sodium: 139 mmol/L (ref 135–145)

## 2023-02-09 MED ORDER — ZINC OXIDE 40 % EX OINT
TOPICAL_OINTMENT | Freq: Three times a day (TID) | CUTANEOUS | Status: DC | PRN
  Administered 2023-02-09: 1 via TOPICAL
  Filled 2023-02-09: qty 57

## 2023-02-09 MED ORDER — SODIUM CHLORIDE 0.9% FLUSH
10.0000 mL | Freq: Two times a day (BID) | INTRAVENOUS | Status: DC
  Administered 2023-02-11: 10 mL via INTRAVENOUS

## 2023-02-09 NOTE — Op Note (Signed)
NAMECHRISTNE, AMPARO MEDICAL RECORD NO: [id number] ACCOUNT NO: [id number] DATE OF BIRTH: 23-Mar-1945 FACILITY: [facility] LOCATION: [id number] PHYSICIAN: Sebastian Ache, MD  Operative Report   DATE OF PROCEDURE: 02/08/2023  PREOPERATIVE DIAGNOSIS:  Colovesical fistula, request for ureteral marking.  PROCEDURE PERFORMED:  Cystoscopy, bilateral retrograde pyelograms with interpretation and injection of ICG dye.  ESTIMATED BLOOD LOSS:  Nil.  COMPLICATIONS:  None.  SPECIMENS:  None.  FINDINGS: 1.  Unremarkable bladder.  No evidence of active overt large fistula. 2.  Unremarkable bilateral retrograde pyelograms.  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  She is undergoing definitive management with planned segmental colon resection and colovesical fistula repair under the care of the  general surgery team today.  Given the proximity of her suspected fistula to especially the left ureter and possible ureteral orifice, they have prudently requested perioperative ureteral marking with ICG dye and possible stenting.  Imaging was reviewed.   Informed consent was obtained and placed in medical record.  DESCRIPTION OF PROCEDURE:  The patient being verified, procedure being cysto, bilateral retrograde pyelograms, possible stenting was confirmed.  Procedure timeout was performed.  Intravenous antibiotics were administered.  General endotracheal anesthesia  induced.  The patient was placed into a low lithotomy position, sterile field was created, prepped and draped the patient's vagina, introitus and proximal thighs using iodine.  Moderate cystocele was noted.  Cystourethroscopy was performed using  21-French rigid cystoscope with offset lens.  Inspection of the urinary bladder revealed no diverticula, calcifications or papillary lesions.  The area of the dome and left wall was very carefully inspected and there was no evidence of overt  active  fistula tract.  This is consistent with the fact that she says she has been relatively asymptomatic recently.  The left ureteral orifice was cannulated with a 6-French end-hole catheter and a left retrograde pyelogram was obtained.  Left retrograde pyelogram demonstrated a single left ureter, single system left kidney.  No filling defects or narrowing noted.  Notably, this was a slurry of 50% ICG dye in contrast.  Right retrograde pyelogram was obtained.  Right retrograde pyelogram demonstrated a single right ureter, single system right kidney.  Right kidney was somewhat low lying with somewhat tortuous ureter, but no hydronephrosis and not frankly ectopic.  We  achieved the goals of ureteral marking.  It was not felt that formal stenting would be warranted at this point.  I discussed these intraoperative findings with Dr. Maisie Fus and her team.  Foley catheter was placed for straight drain, 10 mL of water in the  balloon, and the patient was turned over to general surgery service for the remainder of her surgery today.   SHW D: 02/08/2023 9:14:22 am T: 02/08/2023 10:00:00 am  JOB: [id number]/ [id number]

## 2023-02-09 NOTE — Progress Notes (Signed)
1 Day Post-Op Robotic Sigmoidectomy  Subjective: Feeling well, passing flatus, no nausea, min pain  Objective: Vital signs in last 24 hours: Temp:  [95.1 F (35.1 C)-98.9 F (37.2 C)] 97.8 F (36.6 C) (11/21 0612) Pulse Rate:  [72-89] 85 (11/21 0612) Resp:  [14-18] 18 (11/21 0612) BP: (108-147)/(53-72) 108/58 (11/21 0612) SpO2:  [93 %-100 %] 97 % (11/21 0612)   Intake/Output from previous day: 11/20 0701 - 11/21 0700 In: 1753.1 [P.O.:240; I.V.:1163.1; IV Piggyback:350] Out: 2650 [Urine:2600; Blood:50] Intake/Output this shift: No intake/output data recorded.   General appearance: alert and cooperative GI: soft, non-distended  Incision: no significant drainage  Lab Results:  Recent Labs    02/09/23 0504  WBC 11.6*  HGB 10.8*  HCT 33.7*  PLT 213   BMET Recent Labs    02/09/23 0504  NA 139  K 4.0  CL 108  CO2 22  GLUCOSE 111*  BUN 13  CREATININE 0.75  CALCIUM 8.8*   PT/INR No results for input(s): "LABPROT", "INR" in the last 72 hours. ABG No results for input(s): "PHART", "HCO3" in the last 72 hours.  Invalid input(s): "PCO2", "PO2"  MEDS, Scheduled  acetaminophen  1,000 mg Oral Q6H   alvimopan  12 mg Oral BID   enoxaparin (LOVENOX) injection  40 mg Subcutaneous Q24H   feeding supplement  237 mL Oral BID BM   gabapentin  300 mg Oral BID   lisinopril  40 mg Oral Daily   pyridostigmine  60 mg Oral BID   saccharomyces boulardii  250 mg Oral BID   sodium chloride flush  10 mL Intravenous Q12H    Studies/Results: DG C-Arm 1-60 Min-No Report  Result Date: 02/08/2023 Fluoroscopy was utilized by the requesting physician.  No radiographic interpretation.    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

## 2023-02-09 NOTE — Progress Notes (Signed)
Patient complained of pain at IV site.IV site is bleeding and patient request for removal due to discomfort. IV removed, catheter intact upon removal.

## 2023-02-10 LAB — CBC
HCT: 33.1 % — ABNORMAL LOW (ref 36.0–46.0)
Hemoglobin: 10.4 g/dL — ABNORMAL LOW (ref 12.0–15.0)
MCH: 32.4 pg (ref 26.0–34.0)
MCHC: 31.4 g/dL (ref 30.0–36.0)
MCV: 103.1 fL — ABNORMAL HIGH (ref 80.0–100.0)
Platelets: 202 10*3/uL (ref 150–400)
RBC: 3.21 MIL/uL — ABNORMAL LOW (ref 3.87–5.11)
RDW: 13.7 % (ref 11.5–15.5)
WBC: 7.6 10*3/uL (ref 4.0–10.5)
nRBC: 0 % (ref 0.0–0.2)

## 2023-02-10 LAB — BASIC METABOLIC PANEL
Anion gap: 6 (ref 5–15)
BUN: 20 mg/dL (ref 8–23)
CO2: 23 mmol/L (ref 22–32)
Calcium: 8.7 mg/dL — ABNORMAL LOW (ref 8.9–10.3)
Chloride: 112 mmol/L — ABNORMAL HIGH (ref 98–111)
Creatinine, Ser: 0.64 mg/dL (ref 0.44–1.00)
GFR, Estimated: >60 mL/min (ref >60)
Glucose, Bld: 104 mg/dL — ABNORMAL HIGH (ref 70–99)
Potassium: 4 mmol/L (ref 3.5–5.1)
Sodium: 141 mmol/L (ref 135–145)

## 2023-02-10 MED ORDER — APIXABAN 2.5 MG PO TABS: 2.5000 mg | ORAL_TABLET | Freq: Two times a day (BID) | ORAL | Status: DC

## 2023-02-10 MED ORDER — IBUPROFEN 200 MG PO TABS: 400.0000 mg | ORAL_TABLET | Freq: Four times a day (QID) | ORAL | Status: DC | PRN

## 2023-02-10 MED ORDER — LOPERAMIDE HCL 2 MG PO CAPS
2.0000 mg | ORAL_CAPSULE | Freq: Three times a day (TID) | ORAL | Status: DC
  Administered 2023-02-10 – 2023-02-11 (×4): 2 mg via ORAL
  Filled 2023-02-10 (×4): qty 1

## 2023-02-10 MED ORDER — CALCIUM POLYCARBOPHIL 625 MG PO TABS: 625.0000 mg | ORAL_TABLET | Freq: Two times a day (BID) | ORAL | Status: DC

## 2023-02-10 MED ORDER — PYRIDOSTIGMINE BROMIDE 60 MG PO TABS
60.0000 mg | ORAL_TABLET | Freq: Two times a day (BID) | ORAL | Status: DC
  Administered 2023-02-10 – 2023-02-11 (×2): 60 mg via ORAL
  Filled 2023-02-10 (×3): qty 1

## 2023-02-10 MED ORDER — LOPERAMIDE HCL 2 MG PO CAPS: 2.0000 mg | ORAL_CAPSULE | Freq: Three times a day (TID) | ORAL | Status: DC

## 2023-02-10 MED ORDER — LOPERAMIDE HCL 2 MG PO CAPS
2.0000 mg | ORAL_CAPSULE | Freq: Once | ORAL | Status: AC
  Administered 2023-02-10: 2 mg via ORAL
  Filled 2023-02-10: qty 1

## 2023-02-10 MED ORDER — CALCIUM POLYCARBOPHIL 625 MG PO TABS
625.0000 mg | ORAL_TABLET | Freq: Two times a day (BID) | ORAL | Status: DC
  Administered 2023-02-10 – 2023-02-11 (×3): 625 mg via ORAL
  Filled 2023-02-10 (×3): qty 1

## 2023-02-10 MED ORDER — TRAMADOL HCL 50 MG PO TABS: 50.0000 mg | ORAL_TABLET | Freq: Four times a day (QID) | ORAL | 0 refills | Status: DC | PRN

## 2023-02-10 MED ORDER — IBUPROFEN 400 MG PO TABS: 400.0000 mg | ORAL_TABLET | Freq: Four times a day (QID) | ORAL | Status: DC | PRN

## 2023-02-10 NOTE — Progress Notes (Signed)
2 Days Post-Op Robotic Sigmoidectomy  Subjective: Feeling well, having some diarrhea and trouble with continence due to this, pain worse after forceful cough last night  Objective: Vital signs in last 24 hours: Temp:  [97.8 F (36.6 C)-98.6 F (37 C)] 97.9 F (36.6 C) (11/22 0547) Pulse Rate:  [74-86] 74 (11/22 0547) Resp:  [16-18] 18 (11/22 0547) BP: (120-144)/(55-76) 144/76 (11/22 0547) SpO2:  [94 %-99 %] 94 % (11/22 0547) Weight:  [52.9 kg] 52.9 kg (11/22 0500)   Intake/Output from previous day: 11/21 0701 - 11/22 0700 In: 1620 [P.O.:1620] Out: 1850 [Urine:1850] Intake/Output this shift: No intake/output data recorded.   General appearance: alert and cooperative GI: soft, non-distended  Incision: no significant drainage  Lab Results:  Recent Labs    02/09/23 0504 02/10/23 0500  WBC 11.6* 7.6  HGB 10.8* 10.4*  HCT 33.7* 33.1*  PLT 213 202   BMET Recent Labs    02/09/23 0504 02/10/23 0500  NA 139 141  K 4.0 4.0  CL 108 112*  CO2 22 23  GLUCOSE 111* 104*  BUN 13 20  CREATININE 0.75 0.64  CALCIUM 8.8* 8.7*   PT/INR No results for input(s): "LABPROT", "INR" in the last 72 hours. ABG No results for input(s): "PHART", "HCO3" in the last 72 hours.  Invalid input(s): "PCO2", "PO2"  MEDS, Scheduled  acetaminophen  1,000 mg Oral Q6H   alvimopan  12 mg Oral BID   enoxaparin (LOVENOX) injection  40 mg Subcutaneous Q24H   feeding supplement  237 mL Oral BID BM   gabapentin  300 mg Oral BID   lisinopril  40 mg Oral Daily   loperamide  2 mg Oral Once   loperamide  2 mg Oral TID AC   polycarbophil  625 mg Oral BID   pyridostigmine  60 mg Oral BID   saccharomyces boulardii  250 mg Oral BID   sodium chloride flush  10 mL Intravenous Q12H    Studies/Results: DG C-Arm 1-60 Min-No Report  Result Date: 02/08/2023 Fluoroscopy was utilized by the requesting physician.  No radiographic interpretation.    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

## 2023-02-10 NOTE — Care Management Important Message (Signed)
Important Message  Patient Details IM Letter given. Name: Faith Massey MRN: [id number] Date of Birth: 1945-09-24   Important Message Given:  Yes - Medicare IM     Caren Macadam 02/10/2023, 8:51 AM

## 2023-02-10 NOTE — Progress Notes (Signed)
   02/10/23 0849  TOC Brief Assessment  Insurance and Status Reviewed  Patient has primary care physician Yes  Home environment has been reviewed Yes  Prior level of function: Mod Ind  Prior/Current Home Services No current home services  Social Determinants of Health Reivew SDOH reviewed no interventions necessary  Readmission risk has been reviewed Yes  Transition of care needs no transition of care needs at this time

## 2023-02-11 NOTE — Plan of Care (Signed)
Pt D/C to home, IV removed, AVS reviewed follow up questions answered. Belongings returned. No other needs voiced at this time. BP (!) 155/63 (BP Location: Right Arm)   Pulse 73   Temp 98 F (36.7 C) (Oral)   Resp 18   Ht 4' 10.5" (1.486 m)   Wt 52.9 kg   SpO2 98%   BMI 23.96 kg/m  02/11/23 Faith Massey 12:48 PM

## 2023-02-11 NOTE — Discharge Summary (Signed)
3 Days Post-Op Robotic Sigmoidectomy  Admission: 02/08/2023  Discharge: 02/11/2023    Subjective: Diarrhea much better.  Reports her bowel movements are still very soft  Objective: Vital signs in last 24 hours: Temp:  [97.9 F (36.6 C)-98.1 F (36.7 C)] 98 F (36.7 C) (11/23 0636) Pulse Rate:  [72-81] 73 (11/23 0636) Resp:  [16-18] 18 (11/23 0636) BP: (112-155)/(56-69) 155/63 (11/23 0636) SpO2:  [96 %-99 %] 98 % (11/23 0636)   Intake/Output from previous day: 11/22 0701 - 11/23 0700 In: 1680 [P.O.:1680] Out: 2250 [Urine:2250] Intake/Output this shift: No intake/output data recorded.   General appearance: alert and cooperative GI: soft, non-distended  Incision: no significant drainage  Lab Results:  Recent Labs    02/09/23 0504 02/10/23 0500  WBC 11.6* 7.6  HGB 10.8* 10.4*  HCT 33.7* 33.1*  PLT 213 202   BMET Recent Labs    02/09/23 0504 02/10/23 0500  NA 139 141  K 4.0 4.0  CL 108 112*  CO2 22 23  GLUCOSE 111* 104*  BUN 13 20  CREATININE 0.75 0.64  CALCIUM 8.8* 8.7*   PT/INR No results for input(s): "LABPROT", "INR" in the last 72 hours. ABG No results for input(s): "PHART", "HCO3" in the last 72 hours.  Invalid input(s): "PCO2", "PO2"  MEDS, Scheduled  acetaminophen  1,000 mg Oral Q6H   enoxaparin (LOVENOX) injection  40 mg Subcutaneous Q24H   feeding supplement  237 mL Oral BID BM   gabapentin  300 mg Oral BID   lisinopril  40 mg Oral Daily   loperamide  2 mg Oral TID AC   polycarbophil  625 mg Oral BID   pyridostigmine  60 mg Oral BID   saccharomyces boulardii  250 mg Oral BID   sodium chloride flush  10 mL Intravenous Q12H    Studies/Results: No results found.  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

## 2023-02-11 NOTE — Plan of Care (Signed)
  Problem: Activity: Goal: Ability to tolerate increased activity will improve Outcome: Adequate for Discharge   Problem: Bowel/Gastric: Goal: Gastrointestinal status for postoperative course will improve Outcome: Adequate for Discharge

## 2023-02-13 ENCOUNTER — Telehealth: Payer: Self-pay | Admitting: *Deleted

## 2023-02-13 ENCOUNTER — Telehealth: Payer: Self-pay | Admitting: Neurology

## 2023-02-13 NOTE — Telephone Encounter (Signed)
Call to patient, she reports she just had surgery on 11/20 for colon resection and her discharge instructions had changed her dose of octagam to 20gm/266ml from 25mg /262ml. She is concerned why the dose change and who ordered it. I advised I didn't see documentation's we had made changes. Her next infusion is 12/5 and I advised I would send to Dr. Terrace Arabia to review and speak with her Monday to clarify dose. She is also requesting a virtual visit to discuss all the changes she has had this year. Advised I would see if that was possible with Dr. Terrace Arabia. Patient appreciative of call

## 2023-02-13 NOTE — Transitions of Care (Post Inpatient/ED Visit) (Signed)
02/13/2023  Name: Faith Massey MRN: [id number] DOB: Aug 10, 1945  Today's TOC FU Call Status: Today's TOC FU Call Status:: Successful TOC FU Call Completed TOC FU Call Complete Date: 02/13/23 Patient's Name and Date of Birth confirmed.  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 How have you been since you were released from the hospital?: Better ("I have so much to be thankful for-- I am fine; a little sore after the planned surgery, but otherwise doing fine;  I am back to my normal self, independent and living on my own.") Any questions or concerns?: No  Items Reviewed: Did you receive and understand the discharge instructions provided?: Yes (thoroughly reviewed with patient who verbalizes good understanding of same) Medications obtained,verified, and reconciled?: Yes (Medications Reviewed) (Full medication reconciliation/ review completed; no concerns or discrepancies identified; confirmed patient obtained/ is taking all newly Rx'd medications as instructed; self-manages medications and denies questions/ concerns around medications today) Any new allergies since your discharge?: No Dietary orders reviewed?: Yes Type of Diet Ordered:: Regular diet-- "surgeon said it was okay to go back to normal diet" Do you have support at home?: Yes People in Home: alone Name of Support/Comfort Primary Source: Reports resides alone; independent in self-care activities; supportive son/ daughter assists as/ if needed/ indicated  Medications Reviewed Today: Medications Reviewed Today     Reviewed by [name], RN (Registered Nurse) on 02/13/23 at 1402  Med List Status: <None>   Medication Order Taking? Sig Documenting Provider Last Dose Status Informant  acetaminophen (TYLENOL) 650 MG CR  tablet [id number] Yes Take 1,300 mg by mouth every 8 (eight) hours as needed for pain. [provider] Taking Active Self  apixaban (ELIQUIS) 2.5 MG TABS tablet [id number] Yes Take 1 tablet (2.5 mg total) by mouth 2 (two) times daily. Romie Levee, MD Taking Active   aspirin EC 81 MG tablet [id number] Yes Take 1 tablet (81 mg total) by mouth daily. Swallow whole.  Patient taking differently: Take 81 mg by mouth 3 (three) times a week. Swallow whole.   Osvaldo Shipper, MD Taking Active Self  BLACK ELDERBERRY PO [id number] Yes Take 1 capsule by mouth daily as needed (immune support). [provider] Taking Active Self  Cholecalciferol (VITAMIN D-3) 5000 UNITS TABS [id number] Yes Take 5,000 Units by mouth daily. [provider] Taking Active Self  clotrimazole (MYCELEX) 10 MG troche [id number] Yes Take 1 tablet (10 mg total) by mouth 5 (five) times daily.  Patient taking differently: Take 10 mg by mouth daily.   Copland, Gwenlyn Found, MD Taking Active Self           Med Note [name] Feb 13, 2023  1:51 PM) 02/13/23: reports during [facility] call she has not been taking after her surgery- has not needed   COLLAGEN PO [id number] Yes Take 1 Scoop by mouth daily. [provider] Taking Active Self  ibuprofen (ADVIL) 400 MG tablet [id number] Yes Take 1 tablet (400 mg total) by mouth every 6 (six) hours as needed for mild pain (pain score 1-3). Romie Levee, MD Taking Active   lisinopril (ZESTRIL) 40 MG tablet [id number] Yes Take 1 tablet (40 mg total) by mouth daily. Copland, Gwenlyn Found, MD Taking Active Self  loperamide (IMODIUM) 2 MG capsule [id number] Yes Take 1 capsule (2 mg total) by mouth 3 (three) times daily before  meals. Romie Levee, MD Taking Active            Med Note [name] Feb 13, 2023  1:53 PM) 02/13/23: reports during [facility] call has not needed post-recent hospital surgery  Melatonin 10 MG CAPS [id number] Yes Take 10 mg by mouth at bedtime.  [provider] Taking Active Self  metronidazole (NORITATE) 1 % cream [id number] Yes Apply topically daily. Use as needed for skin breakout Copland, Gwenlyn Found, MD Taking Active Self  Multiple Vitamins-Minerals (MULTIVITAMIN WITH MINERALS) tablet [id number] Yes Take 1 tablet by mouth daily. [provider] Taking Active Self  nystatin (MYCOSTATIN) 100000 UNIT/ML suspension [id number] Yes Take 5 mLs (500,000 Units total) by mouth as needed (thrush). Swish, hold in mouth and spit up to 4x a day as needed  Patient taking differently: Take 5 mLs by mouth daily. Swish, hold in mouth and spit   Copland, Gwenlyn Found, MD Taking Active Self  OCTAGAM 20 GM/200ML SOLN [id number] Yes Inject into the vein every 21 ( twenty-one) days. [provider] Taking Active Self           Med Note [name]   Tue Jan 31, 2023  4:32 PM)    polycarbophil (FIBERCON) 625 MG tablet [id number] Yes Take 1 tablet (625 mg total) by mouth 2 (two) times daily. Romie Levee, MD Taking Active   Probiotic Product (PROBIOTIC PO) [id number] Yes Take 1-2 capsules by mouth daily. [provider] Taking Active Self           Med Note [name], [name]   Tue Jan 31, 2023  4:31 PM) Pt will take 1 or 2 depending on how her stomach feels  pyridostigmine (MESTINON) 60 MG tablet [id number] Yes Take 1 tablet (60 mg total) by mouth 3 (three) times daily.  Patient taking differently: Take 60 mg by mouth 2 (two) times daily.   Levert Feinstein, MD Taking Active Self  traMADol Janean Sark) 50 MG tablet [id number] Yes Take 1 tablet (50 mg total) by mouth every 6 (six) hours as needed. Romie Levee, MD Taking Active            Home Care and Equipment/Supplies: Were Home Health Services Ordered?: No Any new equipment or medical supplies ordered?: No  Functional Questionnaire: Do you need assistance with bathing/showering or dressing?: No Do you need assistance with meal preparation?: No Do you need assistance with  eating?: No Do you have difficulty maintaining continence: No Do you need assistance with getting out of bed/getting out of a chair/moving?: No Do you have difficulty managing or taking your medications?: No  Follow up appointments reviewed: PCP Follow-up appointment confirmed?: NA (verified not indicated per hospital discharging provider discharge notes) Specialist Hospital Follow-up appointment confirmed?: No Reason Specialist Follow-Up Not Confirmed: Patient has Specialist Provider Number and will Call for Appointment Do you need transportation to your follow-up appointment?: No Do you understand care options if your condition(s) worsen?: Yes-patient verbalized understanding  SDOH Interventions Today    Flowsheet Row Most Recent Value  SDOH Interventions   Food Insecurity Interventions Intervention Not Indicated  [patient denies food insecurity today]  Housing Interventions Intervention Not Indicated  [denies housing concerns today]  Transportation Interventions Intervention Not Indicated  [drives self]  Utilities Interventions Intervention Not Indicated      Additional TOC interventions: Reinforced driving recommendations post- recent surgery Patient declines need for ongoing/ further care management/ coordination outreach; no needs identified at time of TOC call today- declines  enrollment in 30-day TOC program   Caryl Pina, RN, BSN, Media planner  Transitions of Care  [facility] [phone]: direct office

## 2023-02-13 NOTE — Telephone Encounter (Signed)
Also pulled a voicemail from this patient from earlier today. Stated she has a few questions regarding her IVIG treatment and her follow up appointment with Shanda Bumps. She asked for a call back to discuss.

## 2023-02-13 NOTE — Telephone Encounter (Signed)
Pt asking to verify change in dosage for OCTAGAM 20 GM/200ML SOLN for IVIG treatment. Would like a call back.

## 2023-02-20 ENCOUNTER — Other Ambulatory Visit: Payer: Self-pay

## 2023-02-20 NOTE — Telephone Encounter (Signed)
Call to patient, no answer. Left message to return call.

## 2023-02-20 NOTE — Telephone Encounter (Signed)
She is getting ivig at home care infusion for MG was getting 25g x2 days,  every 3 week, following her hospital discharge, ok to keep previous dose, change her follow up with me virtual visit, ok double book on Wed injection slot, cancel follow up with Shanda Bumps

## 2023-02-22 LAB — SURGICAL PATHOLOGY

## 2023-02-23 ENCOUNTER — Encounter: Payer: Self-pay | Admitting: Hematology & Oncology

## 2023-02-23 DIAGNOSIS — G709 Myoneural disorder, unspecified: Secondary | ICD-10-CM | POA: Diagnosis not present

## 2023-02-23 DIAGNOSIS — G7001 Myasthenia gravis with (acute) exacerbation: Secondary | ICD-10-CM | POA: Diagnosis not present

## 2023-02-24 DIAGNOSIS — G7001 Myasthenia gravis with (acute) exacerbation: Secondary | ICD-10-CM | POA: Diagnosis not present

## 2023-02-24 DIAGNOSIS — G709 Myoneural disorder, unspecified: Secondary | ICD-10-CM | POA: Diagnosis not present

## 2023-02-27 ENCOUNTER — Encounter: Payer: Self-pay | Admitting: Family Medicine

## 2023-02-27 ENCOUNTER — Ambulatory Visit (INDEPENDENT_AMBULATORY_CARE_PROVIDER_SITE_OTHER): Payer: Medicare HMO | Admitting: Family Medicine

## 2023-02-27 ENCOUNTER — Encounter: Payer: Self-pay | Admitting: Neurology

## 2023-02-27 ENCOUNTER — Telehealth: Payer: Self-pay | Admitting: Neurology

## 2023-02-27 VITALS — BP 124/70 | HR 96 | Temp 98.0°F | Resp 18 | Ht <= 58 in | Wt 111.6 lb

## 2023-02-27 DIAGNOSIS — B370 Candidal stomatitis: Secondary | ICD-10-CM

## 2023-02-27 DIAGNOSIS — J029 Acute pharyngitis, unspecified: Secondary | ICD-10-CM | POA: Diagnosis not present

## 2023-02-27 LAB — POCT RAPID STREP A (OFFICE): Rapid Strep A Screen: NEGATIVE

## 2023-02-27 MED ORDER — NYSTATIN 100000 UNIT/ML MT SUSP: 5.0000 mL | OROMUCOSAL | 2 refills | Status: DC | PRN

## 2023-02-27 NOTE — Progress Notes (Signed)
[facility] [address], [address] [place], [place] [address] [phone]  Date:  02/27/2023   Name:  Faith Massey   DOB:  04/12/1945   MRN:  [id number]  PCP:  Pearline Cables, MD    Chief Complaint: Sore Throat (X Saturday Morning 02/25/23. )   History of Present Illness:  Faith Massey is a 77 y.o. very pleasant female patient who presents with the following:  Pt seen today with concern of ST Last seen by myself in August  History of hypertension, CVA, thymoma, myasthenia gravis, remote breast cancer 1995, hyperlipidemia, osteoporosis, pre-diabetes, pulmonary embolism March 2024 Earlier this summer she had concern of air coming from her bladder.  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   History of pulmonary embolism 12/22/2022   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   Pyelonephritis 11/10/2022   Prediabetes 05/25/2022   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 Antony Blackbird   Hyperlipidemia    Hypertension    Laceration of muscle, fascia and tendon of long head of biceps, unspecified arm, initial encounter    right arm  involving rotator  cuff   Myasthenia gravis (HCC)    Dx'd 03/2022   Osteoporosis 10/12/2012   Pneumonia    Pre-diabetes     Past Surgical History:  Procedure Laterality Date   ABDOMINAL HYSTERECTOMY  03/22/2003   BACK SURGERY  03/21/1998   BREAST SURGERY     reconstruction   CYSTOSCOPY WITH STENT PLACEMENT N/A 02/08/2023   Procedure: POSSIBLE CYSTOSCOPY WITH URETERAL STENT PLACEMENT;  Surgeon: Loletta Parish., MD;  Location: [facility];  Service: Urology;  Laterality: N/A;   MASTECTOMY Bilateral 03/21/1993   RESECTION OF A THYMOMA     04-25-22    Social History   Tobacco Use   Smoking status: Never    Passive exposure: Never   Smokeless tobacco: Never  Vaping Use   Vaping status: Never Used  Substance Use Topics   Alcohol use: Not Currently    Alcohol/week: 1.0 standard drink of alcohol    Types: 1 Glasses of wine per week    Comment: occasional   very rare  Drug use: No    Family History  Problem Relation Age of Onset   Stroke Mother    Hypertension Mother    Myasthenia gravis Mother    Hypertension Father    Colon cancer Neg Hx    Esophageal cancer Neg Hx     Allergies  Allergen Reactions   Prednisone Shortness Of Breath, Swelling, Palpitations, Dermatitis and Hypertension    Patient reports having an intolerance to medication. Reports medication caused swelling and metallic taste in mouth.    Medication list has been reviewed and updated.  Current Outpatient Medications on File Prior to Visit  Medication Sig Dispense Refill   acetaminophen (TYLENOL) 650 MG CR tablet Take 1,300 mg by mouth every 8  (eight) hours as needed for pain.     apixaban (ELIQUIS) 2.5 MG TABS tablet Take 1 tablet (2.5 mg total) by mouth 2 (two) times daily.     aspirin EC 81 MG tablet Take 1 tablet (81 mg total) by mouth daily. Swallow whole. (Patient taking differently: Take 81 mg by mouth 3 (three) times a week. Swallow whole.) 30 tablet 12   BLACK ELDERBERRY PO Take 1 capsule by mouth daily as needed (immune support).     Cholecalciferol (VITAMIN D-3) 5000 UNITS TABS Take 5,000 Units by mouth daily.     clotrimazole (MYCELEX) 10 MG troche Take 1 tablet (10 mg total) by mouth 5 (five) times daily. (Patient taking differently: Take 10 mg by mouth daily.) 35 Troche 1   COLLAGEN PO Take 1 Scoop by mouth daily.     ibuprofen (ADVIL) 400 MG tablet Take 1 tablet (400 mg total) by mouth every 6 (six) hours as needed for mild pain (pain score 1-3).     lisinopril (ZESTRIL) 40 MG tablet Take 1 tablet (40 mg total) by mouth daily. 90 tablet 3   loperamide (IMODIUM) 2 MG capsule Take 1 capsule (2 mg total) by mouth 3 (three) times daily before meals.     Melatonin 10 MG CAPS Take 10 mg by mouth at bedtime.     metronidazole (NORITATE) 1 % cream Apply topically daily. Use as needed for skin breakout 60 g 0   Multiple Vitamins-Minerals (MULTIVITAMIN WITH MINERALS) tablet Take 1 tablet by mouth daily.     nystatin (MYCOSTATIN) 100000 UNIT/ML suspension Take 5 mLs (500,000 Units total) by mouth as needed (thrush). Swish, hold in mouth and spit up to 4x a day as needed (Patient taking differently: Take 5 mLs by mouth daily. Swish, hold in mouth and spit) 120 mL 2   OCTAGAM 20 GM/200ML SOLN Inject into the vein every 21 ( twenty-one) days.     polycarbophil (FIBERCON) 625 MG tablet Take 1 tablet (625 mg total) by mouth 2 (two) times daily.     Probiotic Product (PROBIOTIC PO) Take 1-2 capsules by mouth daily.     pyridostigmine (MESTINON) 60 MG tablet Take 1 tablet (60 mg total) by mouth 3 (three) times daily. (Patient taking  differently: Take 60 mg by mouth 2 (two) times daily.) 90 tablet 6   traMADol (ULTRAM) 50 MG tablet Take 1 tablet (50 mg total) by mouth every 6 (six) hours as needed. 20 tablet 0   No current facility-administered medications on file prior to visit.    Review of Systems:  As per HPI- otherwise negative.   Physical Examination: Vitals:   02/27/23 1133  BP: 124/70  Pulse: 96  Resp: 18  Temp: 98 F (36.7 C)  SpO2: 99%  Vitals:   02/27/23 1133  Weight: 111 lb 9.6 oz (50.6 kg)  Height: 4\' 10"  (1.473 m)   Body mass index is 23.32 kg/m. Ideal Body Weight: Weight in (lb) to have BMI = 25: 119.4  GEN: no acute distress. Normal weight, looks well  HEENT: Atraumatic, Normocephalic.  Bilateral TM wnl, oropharynx mildly erythematous but no exudate.  PEERL,EOMI.   Ears and Nose: No external deformity. CV: RRR, No M/G/R. No JVD. No thrill. No extra heart sounds. PULM: CTA B, no wheezes, crackles, rhonchi. No retractions. No resp. distress. No accessory muscle use. ABD: S, NT, ND EXTR: No c/c/e PSYCH: Normally interactive. Conversant.   Results for orders placed or performed in visit on 02/27/23  POCT rapid strep A  Result Value Ref Range   Rapid Strep A Screen Negative Negative    Assessment and Plan: Sore throat - Plan: POCT rapid strep A  Oral thrush - Plan: nystatin (MYCOSTATIN) 100000 UNIT/ML suspension  Patient seen today with concern of pharyngitis.  Rapid strep is negative, my suspicion is low based on her exam.  Advised her this is likely a viral infection, encouraged supportive care at home she will let us know if not continue to get better Patient notes she will occasionally use nystatin for oral thrush, she would like a refill. Signed Abbe Amsterdam, MD

## 2023-02-27 NOTE — Telephone Encounter (Signed)
Call to patient who reports difficult swallowing for 1 week, She feels like she has to swallow 2-3 times to get food down. She saw her PCP today as well as had post op surgery visit and everything was fine post op. She denies shortness of breath or difficulty breathing. Her last IVIG was 02/23/23.   Discussed with Dr. Terrace Arabia and she advised to take Mestinon up to 3x daily for symptom management. And if is swallowing gets worse advised patient to call back and fit her in on schedule.   Call back to patient who verbalized understanding. 12/18 appointment change to in person visit. Patient aware to go to ER if having difficulty breathing.

## 2023-02-27 NOTE — Telephone Encounter (Signed)
Pt states she started having difficulty swallowing when eating about a week ago. Had colon surgery 2 weeks ago. Requesting call back. Can't access voicemail at this time.

## 2023-02-27 NOTE — Telephone Encounter (Signed)
Recommend an OV

## 2023-02-28 ENCOUNTER — Encounter: Payer: Self-pay | Admitting: Family Medicine

## 2023-02-28 DIAGNOSIS — R059 Cough, unspecified: Secondary | ICD-10-CM

## 2023-02-28 MED ORDER — BENZONATATE 100 MG PO CAPS: 100.0000 mg | ORAL_CAPSULE | Freq: Three times a day (TID) | ORAL | 0 refills | Status: DC | PRN

## 2023-03-01 ENCOUNTER — Ambulatory Visit: Payer: Medicare HMO | Admitting: Physical Therapy

## 2023-03-06 NOTE — Telephone Encounter (Signed)
Returned call to patient, no answer. Left detailed message per Dr. Terrace Arabia. Patient can increase the mestinon to 4x daily as needed but also will need to go to ER if swallowing has become that difficult or any trouble breathing. Plan is to see her in the office Wednesday. Advised to call back if needed.

## 2023-03-06 NOTE — Telephone Encounter (Signed)
Pt called stating she's still having difficulty swallowing, says it took her 1 1/2 hours to eat half of a sandwich. Understands her appt is Wednesday 12/18 but she is concerned with the choking andwould like to speak with someone for other options until then. Requesting call back

## 2023-03-07 ENCOUNTER — Encounter: Payer: Self-pay | Admitting: Family

## 2023-03-07 ENCOUNTER — Ambulatory Visit: Payer: Medicare HMO | Admitting: Physical Therapy

## 2023-03-07 ENCOUNTER — Encounter: Payer: Self-pay | Admitting: Family Medicine

## 2023-03-07 ENCOUNTER — Ambulatory Visit (INDEPENDENT_AMBULATORY_CARE_PROVIDER_SITE_OTHER): Payer: Medicare HMO | Admitting: Family

## 2023-03-07 VITALS — BP 132/74 | HR 97 | Ht <= 58 in | Wt 109.2 lb

## 2023-03-07 DIAGNOSIS — H6691 Otitis media, unspecified, right ear: Secondary | ICD-10-CM | POA: Diagnosis not present

## 2023-03-07 MED ORDER — AMOXICILLIN-POT CLAVULANATE 875-125 MG PO TABS: 1.0000 | ORAL_TABLET | Freq: Two times a day (BID) | ORAL | 0 refills | Status: DC

## 2023-03-07 NOTE — Telephone Encounter (Signed)
Pt was seen on 02/27/23 and Rx for thrush with Nystatin. POCT strep was negative. Please advise.

## 2023-03-07 NOTE — Progress Notes (Signed)
Faith Massey is a 77 y.o. 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     apixaban (ELIQUIS) 2.5 MG TABS tablet Take 1 tablet (2.5 mg total) by mouth 2 (two) times daily.     aspirin EC 81 MG tablet Take 1 tablet (81 mg total) by mouth daily. Swallow whole. (Patient taking differently: Take 81 mg by mouth 3 (three) times a week. Swallow whole.) 30 tablet 12   BLACK ELDERBERRY PO Take 1 capsule by mouth daily as needed (immune support).     Cholecalciferol (VITAMIN D-3) 5000 UNITS TABS Take 5,000 Units by mouth daily.     clotrimazole (MYCELEX) 10 MG troche Take 1 tablet (10 mg total) by mouth 5 (five) times daily. (Patient taking differently: Take 10 mg by mouth daily.) 35 Troche 1   COLLAGEN PO Take 1 Scoop by mouth daily.     ibuprofen (ADVIL) 400 MG tablet Take 1 tablet (400 mg total) by mouth every 6 (six) hours as needed for mild pain (pain score 1-3).     lisinopril (ZESTRIL) 40 MG tablet Take 1 tablet (40 mg total) by mouth daily. 90 tablet 3   Melatonin 10 MG CAPS Take 10 mg by mouth at bedtime.     metronidazole (NORITATE) 1 % cream Apply topically daily. Use as needed  for skin breakout 60 g 0   Multiple Vitamins-Minerals (MULTIVITAMIN WITH MINERALS) tablet Take 1 tablet by mouth daily.     nystatin (MYCOSTATIN) 100000 UNIT/ML suspension Take 5 mLs (500,000 Units total) by mouth as needed (thrush). Swish, hold in mouth and spit up to 4x a day as needed 120 mL 2   OCTAGAM 20 GM/200ML SOLN Inject into the vein every 21 ( twenty-one) days. 200 ML     Probiotic Product (PROBIOTIC PO) Take 1-2 capsules by mouth daily.     pyridostigmine (MESTINON) 60 MG tablet Take 1 tablet (60 mg total) by mouth 3 (three) times daily. (Patient taking differently: Take 60 mg by mouth 2 (two) times daily.) 90 tablet 6   traMADol (ULTRAM) 50 MG tablet Take 1 tablet (50 mg total) by mouth every 6 (six) hours as needed. 20 tablet 0   amoxicillin-clavulanate (AUGMENTIN) 875-125 MG tablet Take 1 tablet by mouth every 12 (twelve) hours. 14 tablet 0   benzonatate (TESSALON) 100 MG capsule Take 1 capsule (100 mg total) by mouth 3 (three) times daily as needed for cough. (Patient not taking: Reported on 03/07/2023) 50 capsule 0   loperamide (IMODIUM) 2 MG capsule Take 1 capsule (2 mg total) by mouth 3 (three) times daily before meals.     polycarbophil (FIBERCON) 625 MG tablet Take 1 tablet (625 mg total) by  mouth 2 (two) times daily. (Patient not taking: Reported on 03/07/2023)     No current facility-administered medications for this visit.    Allergies: Prednisone  Past Medical History:  Diagnosis Date   Arthritis    Breast CA (HCC)    History of radiation therapy    Chest 06/09/2022 - 07/21/2022 - Dr. Antony Blackbird   Hyperlipidemia    Hypertension    Laceration of muscle, fascia and tendon of long head of biceps, unspecified arm, initial encounter    right arm  involving rotator  cuff   Myasthenia gravis (HCC)    Dx'd 03/2022   Osteoporosis 10/12/2012   Pneumonia    Pre-diabetes     Past Surgical History:  Procedure Laterality Date   ABDOMINAL HYSTERECTOMY  03/22/2003   BACK  SURGERY  03/21/1998   BREAST SURGERY     reconstruction   CYSTOSCOPY WITH STENT PLACEMENT N/A 02/08/2023   Procedure: POSSIBLE CYSTOSCOPY WITH URETERAL STENT PLACEMENT;  Surgeon: Loletta Parish., MD;  Location: [facility];  Service: Urology;  Laterality: N/A;   MASTECTOMY Bilateral 03/21/1993   RESECTION OF A THYMOMA     04-25-22    Family History  Problem Relation Age of Onset   Stroke Mother    Hypertension Mother    Myasthenia gravis Mother    Hypertension Father    Colon cancer Neg Hx    Esophageal cancer Neg Hx     Social History   Tobacco Use   Smoking status: Never    Passive exposure: Never   Smokeless tobacco: Never  Substance Use Topics   Alcohol use: Not Currently    Alcohol/week: 1.0 standard drink of alcohol    Types: 1 Glasses of wine per week    Comment: occasional   very rare    Subjective:   Seen last week with suspected viral illness; concerned about worsening right ear pain x 2 days; has been using olive oil/ cotton ball to help with ear pain;    Objective:  Vitals:   03/07/23 1419  BP: 132/74  Pulse: 97  SpO2: 95%  Weight: 109 lb 3.2 oz (49.5 kg)  Height: 4\' 10"  (1.473 m)    General: Well developed, well nourished, in no acute distress  Skin : Warm and dry.  Head: Normocephalic and atraumatic  Eyes: Sclera and conjunctiva clear; pupils round and reactive to light; extraocular movements intact  Ears: External normal; canals clear; right tympanic membranes mildly erythematous Oropharynx: Pink, supple. No suspicious lesions  Neck: Supple without thyromegaly, adenopathy  Lungs: Respirations unlabored;  Neurologic: Alert and oriented; speech intact; face symmetrical; moves all extremities well; CNII-XII intact without focal deficit   Assessment:  1. Right otitis media, unspecified otitis media type     Plan:  Rx for Augmentin 875 mg bid x 7 days; increase fluids, rest and follow up worse, no better.   No follow-ups on file.  No orders of the  defined types were placed in this encounter.   Requested Prescriptions   Signed Prescriptions Disp Refills   amoxicillin-clavulanate (AUGMENTIN) 875-125 MG tablet 14 tablet 0    Sig: Take 1 tablet by mouth every 12 (twelve) hours.

## 2023-03-08 ENCOUNTER — Telehealth: Payer: Self-pay | Admitting: Neurology

## 2023-03-08 ENCOUNTER — Ambulatory Visit (INDEPENDENT_AMBULATORY_CARE_PROVIDER_SITE_OTHER): Payer: Medicare HMO | Admitting: Neurology

## 2023-03-08 ENCOUNTER — Encounter: Payer: Self-pay | Admitting: Neurology

## 2023-03-08 VITALS — BP 112/74 | HR 98 | Ht <= 58 in | Wt 109.0 lb

## 2023-03-08 DIAGNOSIS — D4989 Neoplasm of unspecified behavior of other specified sites: Secondary | ICD-10-CM | POA: Diagnosis not present

## 2023-03-08 DIAGNOSIS — G709 Myoneural disorder, unspecified: Secondary | ICD-10-CM | POA: Diagnosis not present

## 2023-03-08 MED ORDER — PYRIDOSTIGMINE BROMIDE 60 MG PO TABS: 60.0000 mg | ORAL_TABLET | Freq: Four times a day (QID) | ORAL | 6 refills | Status: DC | PRN

## 2023-03-08 NOTE — Telephone Encounter (Signed)
  Faxed to [facility]

## 2023-03-08 NOTE — Telephone Encounter (Signed)
 Lmtrc 1st attempt

## 2023-03-08 NOTE — Telephone Encounter (Signed)
Call to [facility], he is aware we are faxing updates orders for IVIG and need urgent.

## 2023-03-08 NOTE — Telephone Encounter (Signed)
Patient just checked out and missed a call from Dr. Terrace Arabia.   Mentioned something about IVIG.  Patient advising need to call [facility] [phone] need approval from insurance , or do you do the pre auth?  If you have any questions can you call her [phone]

## 2023-03-08 NOTE — Progress Notes (Signed)
Chief Complaint  Patient presents with   Dysphagia    Rm 15 alone  Pt is well, reports about 2-3 weeks ago she started having difficulty swallowing. She has to try to swallow 2 or 3 times in order to get her food down.      ASSESSMENT AND PLAN  Faith Massey is a 77 y.o. female  Seropositive generalized myasthenia gravis,  Presenting with lack of stamina, excessive fatigue, shortness of breath with exertion since [facility] 2023 r, also developed fatigable right ptosis, hoarse voice head drop at her worst,  Diagnosis is confirmed by positive acetylcholine receptor binding, blocking antibody,  CT of the chest showed soft tissue mass at the right anterior mediastinum, 2.3 x 4 x 5.3 cm, likely represents adenoma or other thymic pathology,   Status post robotic thyroidectomy by Dr. 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, completed radiation therapy in May 2024,  She was doing very well postsurgically initially, no longer have head drop,  Worsening myasthenia gravis symptoms postsurgically, that concurrent with her upper respiratory infection, noticeable moderate proximal upper and lower extremity weakness, including neck flexion weakness,  Was started on prednisone tapering 40 mg daily since May 12, 2022, 5 mg decrement every week, complains significant side effect, moon face, pitting edema of lower extremity, off prednisone now, recurrent infection, not a good candidate to restart steroid treatment,  Started home IVIG, 2 g/kg loading dose in May 2024, followed by 1 g/kg maintenance dose, reported significant improvement after that, was able to taper off prednisone,   Recurrent mild swallowing difficulty following upper respiratory infection December, will increase IVIG dosage, will load with 2 g/kg, then followed by maintenance 1 g/kg,   Return To Clinic With NP In 6 Months    DIAGNOSTIC DATA (LABS, IMAGING,  TESTING) - I reviewed patient records, labs, notes, testing and imaging myself where available.   MEDICAL HISTORY:  Faith Massey is a 77 year old female, accompanied by her sister Darl Pikes, seen in request by Dr. Micki Riley, for evaluation of myasthenia gravis,   I reviewed and summarized the referring note.PMHX HTN HLD Breast Cancer, s/p double mastectomy in 1990  Patient lives in independent living, very active, has not seen her primary care since beginning of 2023,  In December 2023, she noticed lack of stamina, easily fatigued, shortness of breath walking her dog walking up hills,  Woke up March 25, 2022, noticed right ptosis, intermittent hoarse voice, which has been persistent that was noticed by her church friend, and family members, who urged her to call primary care on January 8, who directed her to emergency room, she had a stroke evaluation, personally reviewed MRI of the brain March 28, 2022, 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 Pearlean Brownie on March 31, 2022, she was noted to have right ptosis, mild bulbar weakness,  Acetylcholine receptor antibody was positive, blocking 51, binding 27.8, confirmed the diagnosis of myasthenia gravis, she has pending appointment with cardiothoracic surgeon Dr. Dorris Fetch on April 13, 2022  Rest of the laboratory evaluation showed normal CPK TSH, A1c 5.7, lipid panel LDL of  125, CBC WBC was elevated 12, normal hemoglobin of 13, CMP showed calcium 8.7, normal kidney function  Also discussed with her son Jeannett Senior over the phone in detail about her diagnosis,  UPDATE May 11 2022: She is companied by her  daughter at today's clinical visit, status post robotic assisted thyroidectomy by Dr. Dorris Fetch on April 25, 2022, Her postoperative course was uncomplicated and she went home on day 2.,  Recovered very well, pathology showed benign thymoma  A. THYMUS, THYMECTOMY:  - Thymoma, type B2  - Focal extension into thymic fat present  - Margins appear focally involved  - See oncology table and comment   B. LYMPH NODE, MEDIASTINAL, BIOPSY:  - Three benign lymph nodes (0/3)   She was seen by Dr. Dorris Fetch on May 10, 2022, she began to complains cough congestion on May 07, 2022, no fever,  CT angiogram of chest chest on May 10 2022:  1. No evidence of pulmonary embolism or other acute intrathoracic process. 2. Previously seen anterior mediastinal mass has been resected. No residual mass or fluid collection within the anterior mediastinum. 3. Aortic atherosclerosis (ICD10-I70.0).  She was doing very well postsurgically, prior to surgery, she has had drop, shortness of breath with minimal exertion, mild dysphagia, has to be chew carefully, intermittent ptosis,  Few days after surgery, she has significant improvement but concurrent with her upper respiratory symptoms, she noticed worsening myasthenia gravis symptoms, more shortness of breath, difficult to lie flat, has been sleeping in a sitting position over the past couple days, denies fever, denied double vision, no significant head drop,  She is taking Mestinon 60 mg 3 times daily  UPDATE June 03 2022: She is accompanied by her friend at today's visit, was started on prednisone tapering 40 mg daily since May 12, 2022, 5 mg decrement every week, currently on 30 mg daily, complains of side effect metallic taste in her mouth, moody, worsening glucose, A1c was 6.1  Her muscle strength showed some improvement, she can sleep in her bed now, but continue noticeable upper and lower extremity weakness, difficulty raising arm overhead  to her cabinet, get tired easily, losing balance, mild swallowing difficulty if she chew too fast, denied double vision  Pathology of thymus showed 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  Evaluated by radiation oncologist Dr. Roselind Messier on May 30 2022, proceed with radiation daily for 6 weeks   UPDATE June 24th 2024: She is receiving home IVIG, loading dose in May 2024, 1g/kg x2, noticed improvement after that, being off prednisone since June 2024, did not notice worsening weakness, continue taking Mestinon 60 mg 2-3 times a day, torn her right biceps muscle few months ago, still dealing with significant pain, she walks her 15 pounds dog few times each day, but complains of unsteady gait, use a cane as needed  Today's examination showed no significant extraocular muscle, bulbar or limb muscle weakness  UPDATE Mar 08 2023: Hospitalization in August 2024 for acute pyelonephritis, E. 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  She developed some upper respiratory symptoms since Dec 2024, trouble swallowing since, take one hour to finish a piece of toast,  no double vision, no ptosis, still some sob, was put  on Augmentin on Dec 17th,   She is receiving home IVIG every 3 week, by Nedra Hai from [employer] [phone] , last infusion was Dec 5, 6th.   She has tried mestinon 60mg  , did help her symptoms some, today's examination showed mild to moderate neck flexion bilateral upper extremity proximal muscle weakness,  Physical examinations:   Vitals:   03/08/23 1122  Weight: 109 lb (49.4 kg)  Height: 4\' 10"  (1.473 m)     PHYSICAL EXAMNIATION:  Gen: NAD, conversant, well nourised, well groomed                      Cardiovascular: Regular rate rhythm, no peripheral edema, warm, nontender. Eyes: Conjunctivae clear without exudates or hemorrhage Neck: Supple, no carotid bruits. Pulmonary: Clear to auscultation bilaterally   NEUROLOGICAL EXAM:  MENTAL STATUS: Speech/cognition: Awake, alert, oriented to history taking and casual conversation CRANIAL NERVES: CN II: Visual fields are full to confrontation. Pupils are round equal and briskly reactive to light. CN III, IV, VI: extraocular movement are normal.  No ptosis, extraocular movement abnormality noted, CN V: Facial sensation is intact to light touch CN VII: No significant eye closure, cheek puff weakness, CN VIII: Hearing is normal to causal conversation. CN IX, X: Phonation is normal. CN XI: Head turning and shoulder shrug are intact  MOTOR: Mild to moderate neck flexion, mild bilateral shoulder abduction, external rotation, elbow flexion, bilateral hip flexion weakness  REFLEXES: Reflexes are 1  and symmetric at the biceps, triceps, knees, and ankles. Plantar responses are flexor.  SENSORY: Intact to light touch, pinprick and vibratory sensation are intact in fingers and toes.  Bilateral lower extremity pitting edema  COORDINATION: There is no trunk or limb dysmetria noted.  GAIT/STANCE: Able to get up from seated position arm crossed,  REVIEW OF SYSTEMS:  Full 14 system review of systems performed and notable only for as above All other review of systems were negative.   ALLERGIES: Allergies  Allergen Reactions   Prednisone Shortness Of Breath, Swelling, Palpitations, Dermatitis and Hypertension    Patient reports having an intolerance to medication. Reports medication caused swelling and metallic taste in mouth.    HOME MEDICATIONS: Current Outpatient Medications  Medication Sig Dispense Refill   acetaminophen (TYLENOL) 650 MG CR tablet Take 1,300 mg by mouth every 8 (eight) hours as needed for pain.      amoxicillin-clavulanate (AUGMENTIN) 875-125 MG tablet Take 1 tablet by mouth every 12 (twelve) hours. 14 tablet 0   apixaban (ELIQUIS) 2.5 MG TABS tablet Take 1 tablet (2.5 mg total) by mouth 2 (two) times daily.     aspirin EC 81 MG tablet Take 1 tablet (81 mg total) by mouth daily. Swallow whole. (Patient taking differently: Take 81 mg by mouth 3 (three) times a week. Swallow whole.) 30 tablet 12   benzonatate (TESSALON) 100 MG capsule Take 1 capsule (100 mg total) by mouth 3 (three) times daily as needed for cough. 50 capsule 0   BLACK ELDERBERRY PO Take 1 capsule by mouth daily as needed (immune support).     Cholecalciferol (VITAMIN D-3) 5000 UNITS TABS Take 5,000 Units by mouth daily.     clotrimazole (MYCELEX) 10 MG troche Take 1 tablet (10 mg total) by mouth 5 (five) times daily. (Patient taking differently: Take 10 mg by mouth daily.) 35 Troche 1   COLLAGEN PO Take 1 Scoop by mouth daily.     ibuprofen (ADVIL) 400 MG tablet Take 1 tablet (400  mg total) by mouth every 6 (six) hours as needed for mild pain (pain score 1-3).     lisinopril (ZESTRIL) 40 MG tablet Take 1 tablet (40 mg total) by mouth daily. 90 tablet 3   Melatonin 10 MG CAPS Take 10 mg by mouth at bedtime.     metronidazole (NORITATE) 1 % cream Apply topically daily. Use as needed for skin breakout 60 g 0   Multiple Vitamins-Minerals (MULTIVITAMIN WITH MINERALS) tablet Take 1 tablet by mouth daily.     nystatin (MYCOSTATIN) 100000 UNIT/ML suspension Take 5 mLs (500,000 Units total) by mouth as needed (thrush). Swish, hold in mouth and spit up to 4x a day as needed 120 mL 2   OCTAGAM 20 GM/200ML SOLN Inject into the vein every 21 ( twenty-one) days. 200 ML     OCTAGAM 5 GM/50ML SOLN      polycarbophil (FIBERCON) 625 MG tablet Take 1 tablet (625 mg total) by mouth 2 (two) times daily.     Probiotic Product (PROBIOTIC PO) Take 1-2 capsules by mouth daily.     pyridostigmine (MESTINON) 60 MG tablet Take 1 tablet (60 mg total) by  mouth 3 (three) times daily. (Patient taking differently: Take 60 mg by mouth 2 (two) times daily.) 90 tablet 6   traMADol (ULTRAM) 50 MG tablet Take 1 tablet (50 mg total) by mouth every 6 (six) hours as needed. 20 tablet 0   No current facility-administered medications for this visit.    PAST MEDICAL HISTORY: Past Medical History:  Diagnosis Date   Arthritis    Breast [facility])    History of radiation therapy    Chest 06/09/2022 - 07/21/2022 - Dr. Antony Blackbird   Hyperlipidemia    Hypertension    Laceration of muscle, fascia and tendon of long head of biceps, unspecified arm, initial encounter    right arm  involving rotator  cuff   Myasthenia gravis (HCC)    Dx'd 03/2022   Osteoporosis 10/12/2012   Pneumonia    Pre-diabetes     PAST SURGICAL HISTORY: Past Surgical History:  Procedure Laterality Date   ABDOMINAL HYSTERECTOMY  03/22/2003   BACK SURGERY  03/21/1998   BREAST SURGERY     reconstruction   CYSTOSCOPY WITH STENT PLACEMENT N/A 02/08/2023   Procedure: POSSIBLE CYSTOSCOPY WITH URETERAL STENT PLACEMENT;  Surgeon: Loletta Parish., MD;  Location: [facility];  Service: Urology;  Laterality: N/A;   MASTECTOMY Bilateral 03/21/1993   RESECTION OF A THYMOMA     04-25-22    FAMILY HISTORY: Family History  Problem Relation Age of Onset   Stroke Mother    Hypertension Mother    Myasthenia gravis Mother    Hypertension Father    Colon cancer Neg Hx    Esophageal cancer Neg Hx     SOCIAL HISTORY: Social History   Socioeconomic History   Marital status: Divorced    Spouse name: Not on file   Number of children: Not on file   Years of education: Not on file   Highest education level: Bachelor's degree (e.g., BA, AB, BS)  Occupational History   Not on file  Tobacco Use   Smoking status: Never    Passive exposure: Never   Smokeless tobacco: Never  Vaping Use   Vaping status: Never Used  Substance and Sexual Activity   Alcohol use: Not Currently    Alcohol/week:  1.0 standard drink of alcohol    Types: 1 Glasses of wine per week  Comment: occasional   very rare   Drug use: No   Sexual activity: Not Currently  Other Topics Concern   Not on file  Social History Narrative   Lives alone   Right handed   Caffeine- 2 cups of coffee   Social Drivers of Health   Financial Resource Strain: Medium Risk (06/21/2022)   Overall Financial Resource Strain (CARDIA)    Difficulty of Paying Living Expenses: Somewhat hard  Food Insecurity: No Food Insecurity (02/13/2023)   Hunger Vital Sign    Worried About Running Out of Food in the Last Year: Never true    Ran Out of Food in the Last Year: Never true  Recent Concern: Food Insecurity - Food Insecurity Present (02/08/2023)   Hunger Vital Sign    Worried About Running Out of Food in the Last Year: Sometimes true    Ran Out of Food in the Last Year: Never true  Transportation Needs: No Transportation Needs (02/13/2023)   PRAPARE - Administrator, Civil Service (Medical): No    Lack of Transportation (Non-Medical): No  Physical Activity: Insufficiently Active (06/21/2022)   Exercise Vital Sign    Days of Exercise per Week: 5 days    Minutes of Exercise per Session: 20 min  Stress: Stress Concern Present (06/21/2022)   [employer] of Occupational Health - Occupational Stress Questionnaire    Feeling of Stress : To some extent  Social Connections: Moderately Integrated (06/21/2022)   Social Connection and Isolation Panel [NHANES]    Frequency of Communication with Friends and Family: More than three times a week    Frequency of Social Gatherings with Friends and Family: More than three times a week    Attends Religious Services: More than 4 times per year    Active Member of [employer] or Organizations: Yes    Attends Banker Meetings: More than 4 times per year    Marital Status: Divorced  Intimate Partner Violence: Not At Risk (02/13/2023)   Humiliation, Afraid, Rape, and Kick  questionnaire    Fear of Current or Ex-Partner: No    Emotionally Abused: No    Physically Abused: No    Sexually Abused: No     Levert Feinstein, M.D. Ph.D.  [facility] [address] [place], [place] [address] Ph: [phone] Fax: [phone]

## 2023-03-08 NOTE — Telephone Encounter (Signed)
Please contact her home care agent to modify her IVIG schedule. She was getting 1 gram/kg every 3 weeks, last infusion was on Dec 5th and 6th.(  by Nedra Hai from [employer] [phone]) ,  Now with myasthenia gravis exacerbation, worsening swallowing difficulty,  Please change her schedule to 2g/kg loading as soon as possible, weight 109 Lb, about 50kg.  50 x2=100g total divided into 4 days, 25g each day x4 days as loading dose,   Then resume previous schedule of 1g/kg every 3 weeks (divided into 2 days=25g each day x 2 days).

## 2023-03-09 ENCOUNTER — Telehealth: Payer: Self-pay

## 2023-03-09 NOTE — Telephone Encounter (Signed)
Call to patient, she verbalized understanding that could increase mestinion to 4x daily as needed. She is also aware that Dr. Terrace Arabia states that IVIG date of 1/2, 1/3, and 1/6, 1/7 are okay but [facility] Pilgrim at [facility] is working on getting her infused soon. She was offered steroid but refused. Dr. Terrace Arabia doesn't think she is sick enough for IVIG in patient at hospital at this time and patient agrees.

## 2023-03-13 ENCOUNTER — Ambulatory Visit (HOSPITAL_COMMUNITY)
Admission: RE | Admit: 2023-03-13 | Discharge: 2023-03-13 | Disposition: A | Discharge status: Home / Self Care | Payer: Medicare HMO | Source: Ambulatory Visit | Attending: Family Medicine | Admitting: Family Medicine

## 2023-03-13 DIAGNOSIS — D7389 Other diseases of spleen: Secondary | ICD-10-CM | POA: Diagnosis not present

## 2023-03-13 MED ORDER — GADOBUTROL 1 MMOL/ML IV SOLN
5.0000 mL | Freq: Once | INTRAVENOUS | Status: AC | PRN
  Administered 2023-03-13: 5 mL via INTRAVENOUS

## 2023-03-14 ENCOUNTER — Encounter: Payer: Self-pay | Admitting: Family Medicine

## 2023-03-14 MED ORDER — AMOXICILLIN-POT CLAVULANATE 875-125 MG PO TABS: 1.0000 | ORAL_TABLET | Freq: Two times a day (BID) | ORAL | 0 refills | Status: DC

## 2023-03-16 DIAGNOSIS — G709 Myoneural disorder, unspecified: Secondary | ICD-10-CM | POA: Diagnosis not present

## 2023-03-16 DIAGNOSIS — G7001 Myasthenia gravis with (acute) exacerbation: Secondary | ICD-10-CM | POA: Diagnosis not present

## 2023-03-17 DIAGNOSIS — G7001 Myasthenia gravis with (acute) exacerbation: Secondary | ICD-10-CM | POA: Diagnosis not present

## 2023-03-17 DIAGNOSIS — G709 Myoneural disorder, unspecified: Secondary | ICD-10-CM | POA: Diagnosis not present

## 2023-03-19 DIAGNOSIS — G7001 Myasthenia gravis with (acute) exacerbation: Secondary | ICD-10-CM | POA: Diagnosis not present

## 2023-03-19 DIAGNOSIS — G709 Myoneural disorder, unspecified: Secondary | ICD-10-CM | POA: Diagnosis not present

## 2023-03-20 ENCOUNTER — Ambulatory Visit: Payer: Medicare HMO | Admitting: Adult Health

## 2023-03-20 DIAGNOSIS — G7001 Myasthenia gravis with (acute) exacerbation: Secondary | ICD-10-CM | POA: Diagnosis not present

## 2023-03-20 DIAGNOSIS — G709 Myoneural disorder, unspecified: Secondary | ICD-10-CM | POA: Diagnosis not present

## 2023-03-24 ENCOUNTER — Telehealth: Payer: Self-pay | Admitting: Neurology

## 2023-03-24 ENCOUNTER — Encounter: Payer: Self-pay | Admitting: Family Medicine

## 2023-03-24 NOTE — Telephone Encounter (Signed)
 Pt asking if it's ok to go out to continue to avoid crowds because immune system is compromise. On 12/6 went out and next day had a severe sore throat, ear ache and difficulty swallow,Dr. Onita doubled my IVIG treatment ;finished on 12/30. Would like a call back.

## 2023-03-27 NOTE — Telephone Encounter (Signed)
My chart message sen to patient

## 2023-03-29 ENCOUNTER — Encounter: Payer: Self-pay | Admitting: Hematology & Oncology

## 2023-03-29 ENCOUNTER — Inpatient Hospital Stay: Payer: Medicare HMO | Attending: Hematology & Oncology

## 2023-03-29 ENCOUNTER — Inpatient Hospital Stay: Payer: Medicare HMO | Admitting: Hematology & Oncology

## 2023-03-29 VITALS — BP 129/85 | HR 88 | Temp 97.7°F | Resp 24 | Ht <= 58 in | Wt 110.0 lb

## 2023-03-29 DIAGNOSIS — Z86000 Personal history of in-situ neoplasm of breast: Secondary | ICD-10-CM | POA: Diagnosis not present

## 2023-03-29 DIAGNOSIS — Z7901 Long term (current) use of anticoagulants: Secondary | ICD-10-CM | POA: Insufficient documentation

## 2023-03-29 DIAGNOSIS — I2699 Other pulmonary embolism without acute cor pulmonale: Secondary | ICD-10-CM | POA: Diagnosis not present

## 2023-03-29 DIAGNOSIS — C37 Malignant neoplasm of thymus: Secondary | ICD-10-CM | POA: Diagnosis not present

## 2023-03-29 LAB — CBC WITH DIFFERENTIAL (CANCER CENTER ONLY)
Abs Immature Granulocytes: 0.04 10*3/uL (ref 0.00–0.07)
Basophils Absolute: 0 10*3/uL (ref 0.0–0.1)
Basophils Relative: 1 %
Eosinophils Absolute: 0.1 10*3/uL (ref 0.0–0.5)
Eosinophils Relative: 1 %
HCT: 37.2 % (ref 36.0–46.0)
Hemoglobin: 12.5 g/dL (ref 12.0–15.0)
Immature Granulocytes: 1 %
Lymphocytes Relative: 18 %
Lymphs Abs: 1.5 10*3/uL (ref 0.7–4.0)
MCH: 32.7 pg (ref 26.0–34.0)
MCHC: 33.6 g/dL (ref 30.0–36.0)
MCV: 97.4 fL (ref 80.0–100.0)
Monocytes Absolute: 0.8 10*3/uL (ref 0.1–1.0)
Monocytes Relative: 10 %
Neutro Abs: 5.7 10*3/uL (ref 1.7–7.7)
Neutrophils Relative %: 69 %
Platelet Count: 243 10*3/uL (ref 150–400)
RBC: 3.82 MIL/uL — ABNORMAL LOW (ref 3.87–5.11)
RDW: 13.9 % (ref 11.5–15.5)
WBC Count: 8.1 10*3/uL (ref 4.0–10.5)
nRBC: 0 % (ref 0.0–0.2)

## 2023-03-29 LAB — LACTATE DEHYDROGENASE: LDH: 186 U/L (ref 98–192)

## 2023-03-29 LAB — CMP (CANCER CENTER ONLY)
ALT: 20 U/L (ref 0–44)
AST: 24 U/L (ref 15–41)
Albumin: 4.2 g/dL (ref 3.5–5.0)
Alkaline Phosphatase: 65 U/L (ref 38–126)
Anion gap: 9 (ref 5–15)
BUN: 18 mg/dL (ref 8–23)
CO2: 27 mmol/L (ref 22–32)
Calcium: 10 mg/dL (ref 8.9–10.3)
Chloride: 106 mmol/L (ref 98–111)
Creatinine: 0.85 mg/dL (ref 0.44–1.00)
GFR, Estimated: >60 mL/min (ref >60)
Glucose, Bld: 92 mg/dL (ref 70–99)
Potassium: 4.9 mmol/L (ref 3.5–5.1)
Sodium: 142 mmol/L (ref 135–145)
Total Bilirubin: 0.7 mg/dL (ref 0.0–1.2)
Total Protein: 7.8 g/dL (ref 6.5–8.1)

## 2023-03-29 NOTE — Progress Notes (Signed)
 Hematology and Oncology Follow Up Visit  Faith Massey [id number] [birth year] 78 y.o. 03/29/2023  Past Medical History:  Diagnosis Date   Arthritis    Breast CA [facility])    History of radiation therapy    Chest 06/09/2022 - 07/21/2022 - Dr. 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  She seems doing quite well.  However, a little bit troubled by this hoarseness.  She has had it for about a month or so.  I am not sure why she would have the hoarseness.  I know that back in November, she did have surgery for fistula repair.  She got through this quite nicely.  She did have a MRI of the abdomen back in December.  This was because of some splenic lesions.  The MRI was done on 03/13/2023.  Thankfully, the MRI did not show anything that looked suspicious.  She had some splenic hemangiomas.  There is no splenomegaly.  Again, much are why she would have a worsens.  Her prognosis do another CT scan on her..  She is on Eliquis .  She is on Eliquis  now for about 10 months.  This is for a pulmonary embolism that she had back in March 2024.  She has had no issues with fever.  There is been no problems with COVID.  Her appetite has been good.  Her weights been holding pretty steady.  Overall, I would say performance status is probably ECOG 1.    Wt Readings from Last 3 Encounters:  03/29/23 110 lb (49.9 kg)  03/08/23 109 lb (49.4 kg)  03/07/23 109 lb 3.2 oz (49.5 kg)      Medications:   Current Outpatient Medications:    acetaminophen  (TYLENOL ) 650 MG CR tablet, Take 1,300 mg by mouth every 8 (eight) hours as needed for pain., Disp: , Rfl:    apixaban  (ELIQUIS ) 2.5 MG TABS tablet, Take 1 tablet (2.5 mg total) by mouth 2 (two) times daily., Disp: , Rfl:    aspirin  EC 81 MG tablet, Take 1 tablet (81 mg total) by mouth daily. Swallow whole. (Patient taking differently: Take 81 mg by mouth 3 (three) times a week. Swallow whole.), Disp: 30 tablet, Rfl: 12   BLACK ELDERBERRY PO, Take 1 capsule by mouth daily as needed (immune support)., Disp: , Rfl:    Cholecalciferol (VITAMIN D -3) 5000 UNITS TABS, Take 5,000 Units by mouth daily., Disp: , Rfl:    COLLAGEN PO, Take 1 Scoop by mouth daily., Disp: , Rfl:    ibuprofen  (ADVIL ) 400 MG tablet, Take 1 tablet (400 mg total) by mouth every 6 (six) hours as needed for mild pain (pain score 1-3)., Disp: , Rfl:    lisinopril  (ZESTRIL ) 40 MG tablet, Take 1 tablet (40 mg total) by mouth daily., Disp: 90 tablet, Rfl: 3   Melatonin 10 MG CAPS, Take 10 mg by mouth at bedtime., Disp: , Rfl:    metronidazole  (NORITATE ) 1 %  cream, Apply topically daily. Use as needed for skin breakout, Disp: 60 g, Rfl: 0   Multiple Vitamins-Minerals (MULTIVITAMIN WITH MINERALS) tablet, Take 1 tablet by mouth daily., Disp: , Rfl:    nystatin  (MYCOSTATIN ) 100000 UNIT/ML suspension, Take 5 mLs (500,000 Units total) by mouth as needed (thrush). Swish, hold in mouth and spit up to 4x a day as needed, Disp: 120 mL, Rfl: 2   OCTAGAM 20 GM/200ML SOLN, Inject 50 g into the vein. 03/29/2023 Takes 50 grams for 2 days every 3 weeks., Disp: , Rfl:    Probiotic Product (PROBIOTIC PO), Take 1-2 capsules by mouth daily., Disp: , Rfl:    pyridostigmine  (MESTINON ) 60 MG tablet, Take 1 tablet (60 mg total) by mouth 4 (four) times daily as needed., Disp: 90 tablet, Rfl: 6   traMADol  (ULTRAM ) 50 MG tablet, Take 1 tablet (50 mg total) by mouth every 6 (six) hours as needed.,  Disp: 20 tablet, Rfl: 0   benzonatate  (TESSALON ) 100 MG capsule, Take 1 capsule (100 mg total) by mouth 3 (three) times daily as needed for cough. (Patient not taking: Reported on 03/29/2023), Disp: 50 capsule, Rfl: 0   clotrimazole  (MYCELEX ) 10 MG troche, Take 1 tablet (10 mg total) by mouth 5 (five) times daily. (Patient not taking: Reported on 03/29/2023), Disp: 35 Troche, Rfl: 1  Allergies:  Allergies  Allergen Reactions   Prednisone  Shortness Of Breath, Swelling, Palpitations, Dermatitis and Hypertension    Patient reports having an intolerance to medication. Reports medication caused swelling and metallic taste in mouth.    Past Medical History, Surgical history, Social history, and Family History were reviewed and updated.  Review of Systems: Review of Systems  Constitutional: Negative.   HENT:   Positive for voice change.   Eyes: Negative.   Respiratory: Negative.    Cardiovascular: Negative.   Gastrointestinal: Negative.   Endocrine: Negative.   Genitourinary: Negative.    Musculoskeletal: Negative.   Skin: Negative.   Neurological: Negative.   Hematological: Negative.   Psychiatric/Behavioral: Negative.      Physical Exam:  height is 4' 10 (1.473 m) and weight is 110 lb (49.9 kg). Her oral temperature is 97.7 F (36.5 C). Her blood pressure is 129/85 and her pulse is 88. Her respiration is 24 (abnormal) and oxygen saturation is 100%.   Physical Exam Vitals reviewed.  HENT:     Head: Normocephalic and atraumatic.  Eyes:     Pupils: Pupils are equal, round, and reactive to light.  Cardiovascular:     Rate and Rhythm: Normal rate and regular rhythm.     Heart sounds: Normal heart sounds.  Pulmonary:     Effort: Pulmonary effort is normal.     Breath sounds: Normal breath sounds.  Abdominal:     General: Bowel sounds are normal.     Palpations: Abdomen is soft.  Musculoskeletal:        General: No tenderness or deformity. Normal range of motion.     Cervical  back: Normal range of motion.  Lymphadenopathy:     Cervical: No cervical adenopathy.  Skin:    General: Skin is warm and dry.     Findings: No erythema or rash.  Neurological:     Mental Status: She is alert and oriented to person, place, and time.  Psychiatric:        Behavior: Behavior normal.        Thought Content: Thought content normal.        Judgment: Judgment normal.  Lab Results  Component Value Date   WBC 8.1 03/29/2023   HGB 12.5 03/29/2023   HCT 37.2 03/29/2023   MCV 97.4 03/29/2023   PLT 243 03/29/2023     Chemistry      Component Value Date/Time   NA 142 03/29/2023 1445   NA 142 09/12/2022 1204   K 4.9 03/29/2023 1445   CL 106 03/29/2023 1445   CO2 27 03/29/2023 1445   BUN 18 03/29/2023 1445   BUN 17 09/12/2022 1204   CREATININE 0.85 03/29/2023 1445   CREATININE 0.61 05/06/2013 1003      Component Value Date/Time   CALCIUM  10.0 03/29/2023 1445   ALKPHOS 65 03/29/2023 1445   AST 24 03/29/2023 1445   ALT 20 03/29/2023 1445   BILITOT 0.7 03/29/2023 1445      Assessment and Plan-   Ms. Sledd is a very charming 78 year old white female.  She is followed up for her thymoma and for the pulmonary embolism.  Everything looks fantastic from my point of view.  I really want to get a CT scan of her chest.  We will set this up for probably next week.  She will continue on the Eliquis  for right now.  I probably will have her on a maintenance dose of Eliquis  after 1 year of therapeutic Eliquis .  I will probably have to see her back in about 6 weeks.  I suspect that she probably is going to end up having to go to ENT.

## 2023-03-31 ENCOUNTER — Encounter (HOSPITAL_BASED_OUTPATIENT_CLINIC_OR_DEPARTMENT_OTHER): Payer: Self-pay

## 2023-03-31 ENCOUNTER — Ambulatory Visit (HOSPITAL_BASED_OUTPATIENT_CLINIC_OR_DEPARTMENT_OTHER): Admission: RE | Admit: 2023-03-31 | Payer: Medicare HMO | Source: Ambulatory Visit

## 2023-04-04 ENCOUNTER — Encounter: Payer: Self-pay | Admitting: Hematology & Oncology

## 2023-04-05 ENCOUNTER — Ambulatory Visit (HOSPITAL_BASED_OUTPATIENT_CLINIC_OR_DEPARTMENT_OTHER)
Admission: RE | Admit: 2023-04-05 | Discharge: 2023-04-05 | Disposition: A | Discharge status: Home / Self Care | Payer: Medicare HMO | Source: Ambulatory Visit | Attending: Hematology & Oncology | Admitting: Hematology & Oncology

## 2023-04-05 ENCOUNTER — Other Ambulatory Visit: Payer: Self-pay | Admitting: Family Medicine

## 2023-04-05 ENCOUNTER — Encounter (HOSPITAL_BASED_OUTPATIENT_CLINIC_OR_DEPARTMENT_OTHER): Payer: Self-pay

## 2023-04-05 DIAGNOSIS — C37 Malignant neoplasm of thymus: Secondary | ICD-10-CM | POA: Diagnosis not present

## 2023-04-05 DIAGNOSIS — I700 Atherosclerosis of aorta: Secondary | ICD-10-CM | POA: Diagnosis not present

## 2023-04-05 DIAGNOSIS — I10 Essential (primary) hypertension: Secondary | ICD-10-CM

## 2023-04-05 DIAGNOSIS — J984 Other disorders of lung: Secondary | ICD-10-CM | POA: Diagnosis not present

## 2023-04-05 MED ORDER — IOHEXOL 300 MG/ML  SOLN
100.0000 mL | Freq: Once | INTRAMUSCULAR | Status: AC | PRN
  Administered 2023-04-05: 75 mL via INTRAVENOUS

## 2023-04-06 ENCOUNTER — Encounter: Payer: Self-pay | Admitting: Neurology

## 2023-04-06 DIAGNOSIS — G7001 Myasthenia gravis with (acute) exacerbation: Secondary | ICD-10-CM | POA: Diagnosis not present

## 2023-04-06 DIAGNOSIS — G709 Myoneural disorder, unspecified: Secondary | ICD-10-CM | POA: Diagnosis not present

## 2023-04-07 DIAGNOSIS — G7001 Myasthenia gravis with (acute) exacerbation: Secondary | ICD-10-CM | POA: Diagnosis not present

## 2023-04-07 DIAGNOSIS — G709 Myoneural disorder, unspecified: Secondary | ICD-10-CM | POA: Diagnosis not present

## 2023-04-10 ENCOUNTER — Encounter: Payer: Self-pay | Admitting: Hematology & Oncology

## 2023-04-14 ENCOUNTER — Encounter: Payer: Self-pay | Admitting: *Deleted

## 2023-04-17 ENCOUNTER — Encounter: Payer: Self-pay | Admitting: Hematology & Oncology

## 2023-04-17 ENCOUNTER — Encounter: Payer: Self-pay | Admitting: Family Medicine

## 2023-04-17 DIAGNOSIS — I10 Essential (primary) hypertension: Secondary | ICD-10-CM

## 2023-04-17 MED ORDER — LISINOPRIL 40 MG PO TABS: 40.0000 mg | ORAL_TABLET | Freq: Every day | ORAL | 2 refills | Status: DC

## 2023-04-18 ENCOUNTER — Other Ambulatory Visit: Payer: Self-pay

## 2023-04-18 DIAGNOSIS — R490 Dysphonia: Secondary | ICD-10-CM

## 2023-04-21 ENCOUNTER — Encounter: Payer: Self-pay | Admitting: Family Medicine

## 2023-04-21 DIAGNOSIS — S46211D Strain of muscle, fascia and tendon of other parts of biceps, right arm, subsequent encounter: Secondary | ICD-10-CM

## 2023-04-27 DIAGNOSIS — G7001 Myasthenia gravis with (acute) exacerbation: Secondary | ICD-10-CM | POA: Diagnosis not present

## 2023-04-27 DIAGNOSIS — G709 Myoneural disorder, unspecified: Secondary | ICD-10-CM | POA: Diagnosis not present

## 2023-04-28 ENCOUNTER — Telehealth: Payer: Self-pay | Admitting: Orthopaedic Surgery

## 2023-04-28 DIAGNOSIS — G709 Myoneural disorder, unspecified: Secondary | ICD-10-CM | POA: Diagnosis not present

## 2023-04-28 DIAGNOSIS — G7001 Myasthenia gravis with (acute) exacerbation: Secondary | ICD-10-CM | POA: Diagnosis not present

## 2023-04-28 NOTE — Telephone Encounter (Signed)
 IC patient, I obtained a verbal authorization and records faxed to Dr. Alfredo Ano for upcoming appt. To [phone]

## 2023-04-28 NOTE — Telephone Encounter (Signed)
 Received call from pt needing records to go to Dr. Alfredo Ano at [facility]. Pts ph [phone]

## 2023-05-02 DIAGNOSIS — G7000 Myasthenia gravis without (acute) exacerbation: Secondary | ICD-10-CM | POA: Diagnosis not present

## 2023-05-02 DIAGNOSIS — R490 Dysphonia: Secondary | ICD-10-CM | POA: Diagnosis not present

## 2023-05-03 ENCOUNTER — Telehealth: Payer: Self-pay | Admitting: Neurology

## 2023-05-03 NOTE — Telephone Encounter (Signed)
I was contacted by her ENT Dr. Christia Reading for complaints of hoarse voice, atrophy of bilateral vocal cord, planning on elective feeling of vocal cord  Last seen patient in December 2024 for complaints of worsening weakness following her upper respiratory infection, reloaded IVIG 2 g/kg followed by regular maintenance of 1 g/kg every 3 weeks  Please call patient for follow-up visit with me within 1 week

## 2023-05-03 NOTE — Telephone Encounter (Signed)
Called and scheduled for 05/09/22 at 1pm as asked by dr. Terrace Arabia

## 2023-05-08 ENCOUNTER — Encounter: Payer: Self-pay | Admitting: Neurology

## 2023-05-08 ENCOUNTER — Encounter: Payer: Self-pay | Admitting: Family Medicine

## 2023-05-08 NOTE — Telephone Encounter (Signed)
Pt said there will be bad weather on 2/19. Can Dr. Terrace Arabia work me in on another day? Would like a call from the nurse/

## 2023-05-08 NOTE — Telephone Encounter (Signed)
Called and rescheduled pt as requested

## 2023-05-09 ENCOUNTER — Encounter: Payer: Self-pay | Admitting: Neurology

## 2023-05-09 ENCOUNTER — Ambulatory Visit: Payer: Medicare HMO | Admitting: Neurology

## 2023-05-09 VITALS — BP 127/78 | HR 79 | Ht 59.0 in | Wt 114.0 lb

## 2023-05-09 DIAGNOSIS — Z923 Personal history of irradiation: Secondary | ICD-10-CM

## 2023-05-09 DIAGNOSIS — R490 Dysphonia: Secondary | ICD-10-CM

## 2023-05-09 DIAGNOSIS — G709 Myoneural disorder, unspecified: Secondary | ICD-10-CM

## 2023-05-09 DIAGNOSIS — D4989 Neoplasm of unspecified behavior of other specified sites: Secondary | ICD-10-CM

## 2023-05-09 HISTORY — DX: Dysphonia: R49.0

## 2023-05-09 HISTORY — DX: Personal history of irradiation: Z92.3

## 2023-05-09 NOTE — Progress Notes (Addendum)
 Chief Complaint  Patient presents with   Follow-up    Pt in 15, here alone Pt is following up on myasthenic syndrome. Pt states she gets out of breath very easily. Pt states her voice has been changing.       ASSESSMENT AND PLAN  Faith Massey is a 78 y.o. female  Seropositive generalized myasthenia gravis,  Presenting with lack of stamina, excessive fatigue, shortness of breath with exertion since [facility] 2023, also developed fatigable right ptosis, hoarse voice head drop at her worst,  Diagnosis is confirmed by positive acetylcholine receptor binding, blocking antibody,  CT of the chest showed soft tissue mass at the right anterior mediastinum, 2.3 x 4 x 5.3 cm, likely represents adenoma or other thymic pathology,   Status post robotic thyroidectomy by Dr. 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, completed radiation therapy in May 2024,  Initially she was doing well postsurgically, but developed worsening myasthenia gravis symptoms postsurgically, that concurrent with her upper respiratory infection, noticeable moderate proximal upper and lower extremity weakness, including neck flexion weakness, in 2024  Was started on prednisone tapering 40 mg daily since May 12, 2022, 5 mg decrement every week, complains significant side effect, moon face, pitting edema of lower extremity,    Started home IVIG, 2 g/kg loading dose in May 2024, followed by 1 g/kg maintenance dose, reported significant improvement after that, was able to taper off prednisone,   Recurrent mild swallowing difficulty following upper respiratory infection December 2024, respond to reloading and then go back to her maintenance 1 g/kg  Hoarse voice,  Following her surgical intubation in November 2024, she has transient voice change that quickly improved, but since early December 2024 following her upper respiratory infection, she had a  persistent raspy voice, was seen by Dr. Jenne Pane recently, noticed cord atrophy, potential candidate for filler injection,   Her myasthenia gravis is under excellent control, which is less likely contributing to her current voice complains.  May proceed with ENT treatment    Return To Clinic With NP In 6 Months    DIAGNOSTIC DATA (LABS, IMAGING, TESTING) - I reviewed patient records, labs, notes, testing and imaging myself where available. Creatinine 0.81, eGFR 76,  normal IgA 162, M40,   MEDICAL HISTORY:  Faith Massey is a 78 year old female, accompanied by her sister Faith Massey, seen in request by Dr. Micki Riley, for evaluation of myasthenia gravis,   I reviewed and summarized the referring note.PMHX HTN HLD Breast Cancer, s/p double mastectomy in 1990  Patient lives in independent living, very active, has not seen her primary care since beginning of 2023,  In December 2023, she noticed lack of stamina, easily fatigued, shortness of breath walking her dog walking up hills,  Woke up March 25, 2022, noticed right ptosis, intermittent hoarse voice, which has been persistent that was noticed by her church friend, and family members, who urged her to call primary care on January 8, who directed her to emergency room, she had a stroke evaluation, personally reviewed MRI of the brain March 28, 2022, small acute DWI lesions at the right frontal white matter, moderate periventricular small vessel disease  CT angiogram of head and neck showed no large vessel disease, incidental findings of anterior mediastinal mass  CT angiogram of the chest with without contrast showed 2.3 x 4 x 5.3 cm soft tissue mass within the right anterior mediastinum  At her follow-up visit  with Dr. Pearlean Brownie on March 31, 2022, she was noted to have right ptosis, mild bulbar weakness,  Acetylcholine receptor antibody was positive, blocking 51, binding 27.8, confirmed the diagnosis of myasthenia gravis, she has pending  appointment with cardiothoracic surgeon Dr. Dorris Fetch on April 13, 2022  Rest of the laboratory evaluation showed normal CPK TSH, A1c 5.7, lipid panel LDL of 125, CBC WBC was elevated 12, normal hemoglobin of 13, CMP showed calcium 8.7, normal kidney function  Also discussed with her son Faith Massey over the phone in detail about her diagnosis,  UPDATE May 11 2022: She is companied by her daughter at today's clinical visit, status post robotic assisted thyroidectomy by Dr. Dorris Fetch on April 25, 2022, Her postoperative course was uncomplicated and she went home on day 2.,  Recovered very well, pathology showed benign thymoma  A. THYMUS, THYMECTOMY:  - Thymoma, type B2  - Focal extension into thymic fat present  - Margins appear focally involved  - See oncology table and comment   B. LYMPH NODE, MEDIASTINAL, BIOPSY:  - Three benign lymph nodes (0/3)   She was seen by Dr. Dorris Fetch on May 10, 2022, she began to complains cough congestion on May 07, 2022, no fever,  CT angiogram of chest chest on May 10 2022:  1. No evidence of pulmonary embolism or other acute intrathoracic process. 2. Previously seen anterior mediastinal mass has been resected. No residual mass or fluid collection within the anterior mediastinum. 3. Aortic atherosclerosis (ICD10-I70.0).  She was doing very well postsurgically, prior to surgery, she has had drop, shortness of breath with minimal exertion, mild dysphagia, has to be chew carefully, intermittent ptosis,  Few days after surgery, she has significant improvement but concurrent with her upper respiratory symptoms, she noticed worsening myasthenia gravis symptoms, more shortness of breath, difficult to lie flat, has been sleeping in a sitting position over the past couple days, denies fever, denied double vision, no significant head drop,  She is taking Mestinon 60 mg 3 times daily  UPDATE June 03 2022: She is accompanied by her friend at  today's visit, was started on prednisone tapering 40 mg daily since May 12, 2022, 5 mg decrement every week, currently on 30 mg daily, complains of side effect metallic taste in her mouth, moody, worsening glucose, A1c was 6.1  Her muscle strength showed some improvement, she can sleep in her bed now, but continue noticeable upper and lower extremity weakness, difficulty raising arm overhead to her cabinet, get tired easily, losing balance, mild swallowing difficulty if she chew too fast, denied double vision  Pathology of thymus showed 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  Evaluated by radiation oncologist Dr. Roselind Messier on May 30 2022, proceed with radiation daily for 6 weeks   UPDATE June 24th 2024: She is receiving home IVIG, loading dose in May 2024, 1g/kg x2, noticed improvement after that, being off prednisone since June 2024, did not notice worsening weakness, continue taking Mestinon 60 mg 2-3 times a day, torn her right biceps muscle few months ago, still dealing with significant pain, she walks her 15 pounds dog few times each day, but complains of unsteady gait, use a cane as needed  Today's examination showed no significant extraocular muscle, bulbar or limb muscle weakness  UPDATE Mar 08 2023: Hospitalization in August 2024 for acute pyelonephritis, E. 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  She developed some upper respiratory symptoms since Dec 2024, trouble swallowing since, take one hour to finish a piece of toast,  no double vision, no ptosis, still some sob, was put on Augmentin on Dec 17th,   She is receiving home IVIG every 3 week, by Nedra Hai from [employer]  [phone] , last infusion was Dec 5, 6th.   She has tried mestinon 60mg  , did help her symptoms some, today's examination showed mild to moderate neck flexion bilateral upper extremity proximal muscle weakness,  UPDATE May 09 2023: She came in early than expected, communicated with her ENT Dr. Jenne Pane, who has seen her recently for complaints of continued raspy voice,  She had a transient hoarseness following her intubation in November 2024 while having surgery, but quickly recovered, around February 25, 2023, concurrent with her upper respiratory symptoms, sore throat, she noticed raspy voice, which has been persistent despite recovering from that upper respiratory infection  She also get extra loading dose of IVIG in December, which has helped her other myasthenia gravis symptoms such as generalized fatigue weakness, swallowing difficulty, she now Mestinon 60 mg twice a day as needed, walk her dog regularly, denied double vision no ptosis, no swallowing difficulty, shortness of breath when walking up hills,  Today's examination showed excellent motor strength, no bulbar or limb muscle weakness no head drop,  Physical examinations:   Vitals:   05/09/23 0924  Weight: 114 lb (51.7 kg)  Height: 4\' 11"  (1.499 m)     PHYSICAL EXAMNIATION:  Gen: NAD, conversant, well nourised, well groomed                     Cardiovascular: Regular rate rhythm, no peripheral edema, warm, nontender. Eyes: Conjunctivae clear without exudates or hemorrhage Neck: Supple, no carotid bruits. Pulmonary: Clear to auscultation bilaterally   NEUROLOGICAL EXAM:  MENTAL STATUS: Speech/cognition: Awake, alert, oriented to history taking and casual conversation CRANIAL NERVES: CN II: Visual fields are full to confrontation. Pupils are round equal and briskly reactive to light. CN III, IV, VI: extraocular movement are normal.  No ptosis, extraocular movement abnormality noted, CN V: Facial sensation is intact to  light touch CN VII: No significant eye closure, cheek puff weakness, CN VIII: Hearing is normal to causal conversation. CN IX, X: Phonation is normal. CN XI: Head turning and shoulder shrug are intact  MOTOR: Limitation of right arm examination due to right shoulder pain,  REFLEXES: Reflexes are 1  and symmetric at the biceps, triceps, knees, and ankles. Plantar responses are flexor.  SENSORY: Intact to light touch, pinprick and vibratory sensation are intact in fingers and toes.  Bilateral lower extremity pitting edema  COORDINATION: There is no trunk or limb dysmetria noted.  GAIT/STANCE: Able to get up from seated position arm crossed, able to get up from squatting position without assistant  REVIEW OF SYSTEMS:  Full 14 system review of systems performed and notable only for as above All other review of systems were negative.   ALLERGIES: Allergies  Allergen Reactions   Prednisone Shortness Of Breath, Swelling, Palpitations, Dermatitis and Hypertension    Patient reports having an intolerance to medication. Reports medication caused swelling and metallic taste in mouth.    HOME MEDICATIONS: Current Outpatient Medications  Medication Sig Dispense Refill   acetaminophen (TYLENOL) 650  MG CR tablet Take 1,300 mg by mouth every 8 (eight) hours as needed for pain.     apixaban (ELIQUIS) 2.5 MG TABS tablet Take 1 tablet (2.5 mg total) by mouth 2 (two) times daily.     aspirin EC 81 MG tablet Take 1 tablet (81 mg total) by mouth daily. Swallow whole. (Patient taking differently: Take 81 mg by mouth 3 (three) times a week. Swallow whole.) 30 tablet 12   BLACK ELDERBERRY PO Take 1 capsule by mouth daily as needed (immune support).     Cholecalciferol (VITAMIN D-3) 5000 UNITS TABS Take 5,000 Units by mouth daily.     clotrimazole (MYCELEX) 10 MG troche Take 1 tablet (10 mg total) by mouth 5 (five) times daily. 35 Troche 1   COLLAGEN PO Take 1 Scoop by mouth daily.     ibuprofen  (ADVIL) 400 MG tablet Take 1 tablet (400 mg total) by mouth every 6 (six) hours as needed for mild pain (pain score 1-3).     lisinopril (ZESTRIL) 40 MG tablet Take 1 tablet (40 mg total) by mouth daily. 90 tablet 2   Melatonin 10 MG CAPS Take 10 mg by mouth at bedtime.     metronidazole (NORITATE) 1 % cream Apply topically daily. Use as needed for skin breakout 60 g 0   Multiple Vitamins-Minerals (MULTIVITAMIN WITH MINERALS) tablet Take 1 tablet by mouth daily.     nystatin (MYCOSTATIN) 100000 UNIT/ML suspension Take 5 mLs (500,000 Units total) by mouth as needed (thrush). Swish, hold in mouth and spit up to 4x a day as needed 120 mL 2   OCTAGAM 20 GM/200ML SOLN Inject 50 g into the vein. 03/29/2023 Takes 50 grams for 2 days every 3 weeks.     Probiotic Product (PROBIOTIC PO) Take 1-2 capsules by mouth daily.     pyridostigmine (MESTINON) 60 MG tablet Take 1 tablet (60 mg total) by mouth 4 (four) times daily as needed. 90 tablet 6   traMADol (ULTRAM) 50 MG tablet Take 1 tablet (50 mg total) by mouth every 6 (six) hours as needed. 20 tablet 0   benzonatate (TESSALON) 100 MG capsule Take 1 capsule (100 mg total) by mouth 3 (three) times daily as needed for cough. (Patient not taking: Reported on 05/09/2023) 50 capsule 0   No current facility-administered medications for this visit.    PAST MEDICAL HISTORY: Past Medical History:  Diagnosis Date   Arthritis    Breast CA [facility])    History of radiation therapy    Chest 06/09/2022 - 07/21/2022 - Dr. Antony Blackbird   Hyperlipidemia    Hypertension    Laceration of muscle, fascia and tendon of long head of biceps, unspecified arm, initial encounter    right arm  involving rotator  cuff   Myasthenia gravis (HCC)    Dx'd 03/2022   Osteoporosis 10/12/2012   Pneumonia    Pre-diabetes     PAST SURGICAL HISTORY: Past Surgical History:  Procedure Laterality Date   ABDOMINAL HYSTERECTOMY  03/22/2003   BACK SURGERY  03/21/1998   BREAST SURGERY      reconstruction   CYSTOSCOPY WITH STENT PLACEMENT N/A 02/08/2023   Procedure: POSSIBLE CYSTOSCOPY WITH URETERAL STENT PLACEMENT;  Surgeon: Loletta Parish., MD;  Location: [facility];  Service: Urology;  Laterality: N/A;   MASTECTOMY Bilateral 03/21/1993   RESECTION OF A THYMOMA     04-25-22    FAMILY HISTORY: Family History  Problem Relation Age of Onset   Stroke  Mother    Hypertension Mother    Myasthenia gravis Mother    Hypertension Father    Colon cancer Neg Hx    Esophageal cancer Neg Hx     SOCIAL HISTORY: Social History   Socioeconomic History   Marital status: Divorced    Spouse name: Not on file   Number of children: Not on file   Years of education: Not on file   Highest education level: Bachelor's degree (e.g., BA, AB, BS)  Occupational History   Not on file  Tobacco Use   Smoking status: Never    Passive exposure: Never   Smokeless tobacco: Never  Vaping Use   Vaping status: Never Used  Substance and Sexual Activity   Alcohol use: Not Currently    Alcohol/week: 1.0 standard drink of alcohol    Types: 1 Glasses of wine per week    Comment: occasional   very rare   Drug use: No   Sexual activity: Not Currently  Other Topics Concern   Not on file  Social History Narrative   Lives alone   Right handed   Caffeine- 2 cups of coffee   Social Drivers of Health   Financial Resource Strain: Medium Risk (06/21/2022)   Overall Financial Resource Strain (CARDIA)    Difficulty of Paying Living Expenses: Somewhat hard  Food Insecurity: No Food Insecurity (02/13/2023)   Hunger Vital Sign    Worried About Running Out of Food in the Last Year: Never true    Ran Out of Food in the Last Year: Never true  Recent Concern: Food Insecurity - Food Insecurity Present (02/08/2023)   Hunger Vital Sign    Worried About Running Out of Food in the Last Year: Sometimes true    Ran Out of Food in the Last Year: Never true  Transportation Needs: No Transportation Needs  (02/13/2023)   PRAPARE - Administrator, Civil Service (Medical): No    Lack of Transportation (Non-Medical): No  Physical Activity: Insufficiently Active (06/21/2022)   Exercise Vital Sign    Days of Exercise per Week: 5 days    Minutes of Exercise per Session: 20 min  Stress: Stress Concern Present (06/21/2022)   [employer] of Occupational Health - Occupational Stress Questionnaire    Feeling of Stress : To some extent  Social Connections: Moderately Integrated (06/21/2022)   Social Connection and Isolation Panel [NHANES]    Frequency of Communication with Friends and Family: More than three times a week    Frequency of Social Gatherings with Friends and Family: More than three times a week    Attends Religious Services: More than 4 times per year    Active Member of [employer] or Organizations: Yes    Attends Banker Meetings: More than 4 times per year    Marital Status: Divorced  Intimate Partner Violence: Not At Risk (02/13/2023)   Humiliation, Afraid, Rape, and Kick questionnaire    Fear of Current or Ex-Partner: No    Emotionally Abused: No    Physically Abused: No    Sexually Abused: No     Levert Feinstein, M.D. Ph.D.  [facility] [address], [address] [place], [place] [address] Ph: [phone] Fax: [phone]

## 2023-05-10 ENCOUNTER — Ambulatory Visit: Payer: Medicare HMO | Admitting: Neurology

## 2023-05-10 ENCOUNTER — Inpatient Hospital Stay: Payer: Medicare HMO | Admitting: Hematology & Oncology

## 2023-05-10 ENCOUNTER — Inpatient Hospital Stay: Payer: Medicare HMO

## 2023-05-15 DIAGNOSIS — M25811 Other specified joint disorders, right shoulder: Secondary | ICD-10-CM | POA: Diagnosis not present

## 2023-05-16 ENCOUNTER — Encounter: Payer: Self-pay | Admitting: Hematology & Oncology

## 2023-05-16 ENCOUNTER — Inpatient Hospital Stay: Payer: Medicare HMO | Attending: Hematology & Oncology

## 2023-05-16 ENCOUNTER — Inpatient Hospital Stay (HOSPITAL_BASED_OUTPATIENT_CLINIC_OR_DEPARTMENT_OTHER): Payer: Medicare HMO | Admitting: Hematology & Oncology

## 2023-05-16 VITALS — BP 113/64 | HR 88 | Temp 98.1°F | Resp 18 | Ht 59.0 in | Wt 111.8 lb

## 2023-05-16 DIAGNOSIS — G709 Myoneural disorder, unspecified: Secondary | ICD-10-CM | POA: Diagnosis not present

## 2023-05-16 DIAGNOSIS — N321 Vesicointestinal fistula: Secondary | ICD-10-CM

## 2023-05-16 DIAGNOSIS — Z86000 Personal history of in-situ neoplasm of breast: Secondary | ICD-10-CM | POA: Diagnosis not present

## 2023-05-16 DIAGNOSIS — R0602 Shortness of breath: Secondary | ICD-10-CM | POA: Diagnosis not present

## 2023-05-16 DIAGNOSIS — Z7901 Long term (current) use of anticoagulants: Secondary | ICD-10-CM | POA: Diagnosis not present

## 2023-05-16 DIAGNOSIS — Z9013 Acquired absence of bilateral breasts and nipples: Secondary | ICD-10-CM | POA: Insufficient documentation

## 2023-05-16 DIAGNOSIS — C37 Malignant neoplasm of thymus: Secondary | ICD-10-CM

## 2023-05-16 DIAGNOSIS — K5790 Diverticulosis of intestine, part unspecified, without perforation or abscess without bleeding: Secondary | ICD-10-CM

## 2023-05-16 DIAGNOSIS — G7000 Myasthenia gravis without (acute) exacerbation: Secondary | ICD-10-CM | POA: Insufficient documentation

## 2023-05-16 DIAGNOSIS — D4989 Neoplasm of unspecified behavior of other specified sites: Secondary | ICD-10-CM

## 2023-05-16 DIAGNOSIS — I2699 Other pulmonary embolism without acute cor pulmonale: Secondary | ICD-10-CM | POA: Diagnosis not present

## 2023-05-16 LAB — CBC WITH DIFFERENTIAL (CANCER CENTER ONLY)
Abs Immature Granulocytes: 0.03 10*3/uL (ref 0.00–0.07)
Basophils Absolute: 0.1 10*3/uL (ref 0.0–0.1)
Basophils Relative: 1 %
Eosinophils Absolute: 0.1 10*3/uL (ref 0.0–0.5)
Eosinophils Relative: 1 %
HCT: 37.6 % (ref 36.0–46.0)
Hemoglobin: 12.1 g/dL (ref 12.0–15.0)
Immature Granulocytes: 0 %
Lymphocytes Relative: 16 %
Lymphs Abs: 1.2 10*3/uL (ref 0.7–4.0)
MCH: 32.5 pg (ref 26.0–34.0)
MCHC: 32.2 g/dL (ref 30.0–36.0)
MCV: 101.1 fL — ABNORMAL HIGH (ref 80.0–100.0)
Monocytes Absolute: 1 10*3/uL (ref 0.1–1.0)
Monocytes Relative: 13 %
Neutro Abs: 5.3 10*3/uL (ref 1.7–7.7)
Neutrophils Relative %: 69 %
Platelet Count: 285 10*3/uL (ref 150–400)
RBC: 3.72 MIL/uL — ABNORMAL LOW (ref 3.87–5.11)
RDW: 14.3 % (ref 11.5–15.5)
WBC Count: 7.6 10*3/uL (ref 4.0–10.5)
nRBC: 0 % (ref 0.0–0.2)

## 2023-05-16 LAB — CMP (CANCER CENTER ONLY)
ALT: 19 U/L (ref 0–44)
AST: 18 U/L (ref 15–41)
Albumin: 4 g/dL (ref 3.5–5.0)
Alkaline Phosphatase: 68 U/L (ref 38–126)
Anion gap: 7 (ref 5–15)
BUN: 28 mg/dL — ABNORMAL HIGH (ref 8–23)
CO2: 27 mmol/L (ref 22–32)
Calcium: 9.8 mg/dL (ref 8.9–10.3)
Chloride: 108 mmol/L (ref 98–111)
Creatinine: 0.81 mg/dL (ref 0.44–1.00)
GFR, Estimated: >60 mL/min (ref >60)
Glucose, Bld: 107 mg/dL — ABNORMAL HIGH (ref 70–99)
Potassium: 5.1 mmol/L (ref 3.5–5.1)
Sodium: 142 mmol/L (ref 135–145)
Total Bilirubin: 0.5 mg/dL (ref 0.0–1.2)
Total Protein: 6.5 g/dL (ref 6.5–8.1)

## 2023-05-16 NOTE — Progress Notes (Signed)
 Hematology and Oncology Follow Up Visit  Faith Massey [id number] [birth year] 78 y.o. 05/16/2023  Past Medical History:  Diagnosis Date   Arthritis    Breast CA [facility])    History of radiation therapy    Chest 06/09/2022 - 07/21/2022 - Dr. 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 is here for follow-up.  Her main problem now is her right shoulder.  She is going to probably need to have shoulder replacement.  She is seeing Dr. Rennis Chris of Orthopedic surgery regarding this.  It sounds like she probably will have surgery in the Spring.  She currently is getting some physical therapy.  She also has noticed some problems with some shortness of breath.  She is on Eliquis for the past history of pulmonary embolism.  We did a CT of the chest back on 04/05/2023.  This did not show anything with the lungs.  She has had no fever.  She has had no problems with COVID.  She has had no bleeding.  There is been no change in bowel bladder habits.  She is still dealing with the myasthenia.  Overall, I would say that her performance status right now is ECOG 1.       Wt Readings from Last 3 Encounters:  05/16/23 111 lb 12.8 oz (50.7 kg)  05/09/23 114 lb (51.7 kg)  03/29/23 110 lb (49.9 kg)     Medications:   Current Outpatient Medications:    acetaminophen (TYLENOL) 650 MG CR tablet, Take 1,300 mg by mouth every 8 (eight) hours as needed for pain., Disp: , Rfl:    apixaban (ELIQUIS) 2.5 MG TABS tablet,  Take 1 tablet (2.5 mg total) by mouth 2 (two) times daily., Disp: , Rfl:    aspirin EC 81 MG tablet, Take 1 tablet (81 mg total) by mouth daily. Swallow whole. (Patient taking differently: Take 81 mg by mouth 3 (three) times a week. Swallow whole.), Disp: 30 tablet, Rfl: 12   BLACK ELDERBERRY PO, Take 1 capsule by mouth daily as needed (immune support)., Disp: , Rfl:    Cholecalciferol (VITAMIN D-3) 5000 UNITS TABS, Take 5,000 Units by mouth daily., Disp: , Rfl:    clotrimazole (MYCELEX) 10 MG troche, Take 1 tablet (10 mg total) by mouth 5 (five) times daily., Disp: 35 Troche, Rfl: 1   COLLAGEN PO, Take 1 Scoop by mouth daily., Disp: , Rfl:    ibuprofen (ADVIL) 400 MG tablet, Take 1 tablet (400 mg total) by mouth every 6 (six) hours as needed for mild pain (pain score 1-3)., Disp: , Rfl:    lisinopril (ZESTRIL) 40 MG tablet, Take 1 tablet (40 mg total) by mouth daily., Disp: 90 tablet, Rfl: 2   Melatonin 10 MG CAPS, Take 10 mg by mouth at bedtime., Disp: , Rfl:    metronidazole (NORITATE) 1 % cream, Apply topically daily. Use as needed for skin breakout, Disp: 60 g, Rfl: 0   Multiple Vitamins-Minerals (MULTIVITAMIN  WITH MINERALS) tablet, Take 1 tablet by mouth daily., Disp: , Rfl:    nystatin (MYCOSTATIN) 100000 UNIT/ML suspension, Take 5 mLs (500,000 Units total) by mouth as needed (thrush). Swish, hold in mouth and spit up to 4x a day as needed, Disp: 120 mL, Rfl: 2   OCTAGAM 20 GM/200ML SOLN, Inject 50 g into the vein. 03/29/2023 Takes 50 grams for 2 days every 3 weeks., Disp: , Rfl:    Probiotic Product (PROBIOTIC PO), Take 1-2 capsules by mouth daily., Disp: , Rfl:    pyridostigmine (MESTINON) 60 MG tablet, Take 1 tablet (60 mg total) by mouth 4 (four) times daily as needed., Disp: 90 tablet, Rfl: 6   traMADol (ULTRAM) 50 MG tablet, Take 1 tablet (50 mg total) by mouth every 6 (six) hours as needed., Disp: 20 tablet, Rfl: 0  Allergies:  Allergies  Allergen Reactions   Prednisone Shortness Of  Breath, Swelling, Palpitations, Dermatitis and Hypertension     Reports medication caused swelling and metallic taste in mouth.    Past Medical History, Surgical history, Social history, and Family History were reviewed and updated.  Review of Systems: Review of Systems  Constitutional: Negative.   HENT:   Positive for voice change.   Eyes: Negative.   Respiratory: Negative.    Cardiovascular: Negative.   Gastrointestinal: Negative.   Endocrine: Negative.   Genitourinary: Negative.    Musculoskeletal: Negative.   Skin: Negative.   Neurological: Negative.   Hematological: Negative.   Psychiatric/Behavioral: Negative.      Physical Exam:  height is 4\' 11"  (1.499 m) and weight is 111 lb 12.8 oz (50.7 kg). Her oral temperature is 98.1 F (36.7 C). Her blood pressure is 113/64 and her pulse is 88. Her respiration is 18 and oxygen saturation is 100%.   Physical Exam Vitals reviewed.  HENT:     Head: Normocephalic and atraumatic.  Eyes:     Pupils: Pupils are equal, round, and reactive to light.  Cardiovascular:     Rate and Rhythm: Normal rate and regular rhythm.     Heart sounds: Normal heart sounds.  Pulmonary:     Effort: Pulmonary effort is normal.     Breath sounds: Normal breath sounds.  Abdominal:     General: Bowel sounds are normal.     Palpations: Abdomen is soft.  Musculoskeletal:        General: No tenderness or deformity. Normal range of motion.     Cervical back: Normal range of motion.  Lymphadenopathy:     Cervical: No cervical adenopathy.  Skin:    General: Skin is warm and dry.     Findings: No erythema or rash.  Neurological:     Mental Status: She is alert and oriented to person, place, and time.  Psychiatric:        Behavior: Behavior normal.        Thought Content: Thought content normal.        Judgment: Judgment normal.     Lab Results  Component Value Date   WBC 7.6 05/16/2023   HGB 12.1 05/16/2023   HCT 37.6 05/16/2023   MCV 101.1  (H) 05/16/2023   PLT 285 05/16/2023     Chemistry      Component Value Date/Time   NA 142 05/16/2023 1208   NA 142 09/12/2022 1204   K 5.1 05/16/2023 1208   CL 108 05/16/2023 1208   CO2 27 05/16/2023 1208   BUN 28 (H) 05/16/2023 1208   BUN  17 09/12/2022 1204   CREATININE 0.81 05/16/2023 1208   CREATININE 0.61 05/06/2013 1003      Component Value Date/Time   CALCIUM 9.8 05/16/2023 1208   ALKPHOS 68 05/16/2023 1208   AST 18 05/16/2023 1208   ALT 19 05/16/2023 1208   BILITOT 0.5 05/16/2023 1208      Assessment and Plan-   Ms. Glassburn is a very charming 78 year old white female.  She is followed up for her thymoma and for the pulmonary embolism.  Everything looks fantastic from my point of view.  I do not see a problem with her having shoulder surgery.  Everything should be okay even though she is on blood thinner.  This can always be stopped.  I am not sure why she has the shortness of breath.  Everything looked fine.  Her oxygen level is okay.  I do not know if the myasthenia may have something to do with this.  I would like to get her back in 3 months.  By then, I would think that she would have had her shoulder surgery.  I forgot to mention that she has seen ENT for her hoarseness.  This does sound a lot better.  I do not think she has a paralyzed vocal cord.

## 2023-05-18 DIAGNOSIS — G709 Myoneural disorder, unspecified: Secondary | ICD-10-CM | POA: Diagnosis not present

## 2023-05-18 DIAGNOSIS — G7001 Myasthenia gravis with (acute) exacerbation: Secondary | ICD-10-CM | POA: Diagnosis not present

## 2023-05-19 DIAGNOSIS — G709 Myoneural disorder, unspecified: Secondary | ICD-10-CM | POA: Diagnosis not present

## 2023-05-19 DIAGNOSIS — G7001 Myasthenia gravis with (acute) exacerbation: Secondary | ICD-10-CM | POA: Diagnosis not present

## 2023-05-25 ENCOUNTER — Ambulatory Visit: Payer: Medicare HMO | Admitting: Neurology

## 2023-06-07 DIAGNOSIS — M25511 Pain in right shoulder: Secondary | ICD-10-CM | POA: Diagnosis not present

## 2023-06-08 DIAGNOSIS — G709 Myoneural disorder, unspecified: Secondary | ICD-10-CM | POA: Diagnosis not present

## 2023-06-08 DIAGNOSIS — G7001 Myasthenia gravis with (acute) exacerbation: Secondary | ICD-10-CM | POA: Diagnosis not present

## 2023-06-09 DIAGNOSIS — G709 Myoneural disorder, unspecified: Secondary | ICD-10-CM | POA: Diagnosis not present

## 2023-06-09 DIAGNOSIS — G7001 Myasthenia gravis with (acute) exacerbation: Secondary | ICD-10-CM | POA: Diagnosis not present

## 2023-06-13 ENCOUNTER — Other Ambulatory Visit: Payer: Self-pay

## 2023-06-13 ENCOUNTER — Ambulatory Visit: Attending: Orthopedic Surgery

## 2023-06-13 DIAGNOSIS — M25611 Stiffness of right shoulder, not elsewhere classified: Secondary | ICD-10-CM | POA: Insufficient documentation

## 2023-06-13 DIAGNOSIS — G8929 Other chronic pain: Secondary | ICD-10-CM | POA: Insufficient documentation

## 2023-06-13 DIAGNOSIS — M6281 Muscle weakness (generalized): Secondary | ICD-10-CM | POA: Diagnosis not present

## 2023-06-13 DIAGNOSIS — M25511 Pain in right shoulder: Secondary | ICD-10-CM | POA: Insufficient documentation

## 2023-06-13 DIAGNOSIS — R490 Dysphonia: Secondary | ICD-10-CM | POA: Diagnosis not present

## 2023-06-13 NOTE — Therapy (Addendum)
 OUTPATIENT PHYSICAL THERAPY SHOULDER EVALUATION   Patient Name: Faith Massey MRN: [id number] DOB:[birth year], 78 y.o., female Today's Date: 06/13/2023  END OF SESSION:  PT End of Session - 06/13/23 1501     Visit Number 1    Date for PT Re-Evaluation 08/08/23    Progress Note Due on Visit 10    PT Start Time 1145    PT Stop Time 1230    PT Time Calculation (min) 45 min    Activity Tolerance Patient tolerated treatment well    Behavior During Therapy [facility] for tasks assessed/performed             Past Medical History:  Diagnosis Date   Arthritis    Breast CA (HCC)    History of radiation therapy    Chest 06/09/2022 - 07/21/2022 - Dr. Retta Caster   Hyperlipidemia    Hypertension    Laceration of muscle, fascia and tendon of long head of biceps, unspecified arm, initial encounter    right arm  involving rotator  cuff   Myasthenia gravis (HCC)    Dx'd 03/2022   Osteoporosis 10/12/2012   Pneumonia    Pre-diabetes    Past Surgical History:  Procedure Laterality Date   ABDOMINAL HYSTERECTOMY  03/22/2003   BACK SURGERY  03/21/1998   BREAST SURGERY     reconstruction   CYSTOSCOPY WITH STENT PLACEMENT N/A 02/08/2023   Procedure: POSSIBLE CYSTOSCOPY WITH URETERAL STENT PLACEMENT;  Surgeon: Melody Spurling., MD;  Location: [facility];  Service: Urology;  Laterality: N/A;   MASTECTOMY Bilateral 03/21/1993   RESECTION OF A THYMOMA     04-25-22   Patient Active Problem List   Diagnosis Date Noted   S/P radiation therapy 05/09/2023   Hoarseness of voice 05/09/2023   Diverticular disease 02/08/2023   History of pulmonary embolism 12/22/2022   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   Pyelonephritis 11/10/2022   Prediabetes 05/25/2022   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   S/P Robotic Assisted Right Video Thoracoscopy with resection of Thymus 04/25/2022   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   Myasthenic syndrome (HCC) 04/12/2022   CVA (cerebral vascular accident) (HCC) 03/28/2022   HTN  (hypertension) 03/28/2022   Chest mass 03/28/2022   Right knee injury 02/14/2017   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  However her recent fall exacerbated her R shoulder pain and her function. She has been trying to replicate the ex she was instructed in last year and has gained some improvement. Hand dominance: Right  PERTINENT HISTORY: Extensive medical history, most pertinent the myasthenia gravis, had long head biceps tear 10 months ago, fell 3 weeks ago and hurt R shoulder.  Referred by orthopedist office.  PAIN:  Are you having pain? Yes: NPRS scale: 5 to10 Pain location: R shoulder primarily anterior/ superior Pain description: consistent pain, worse with trying to lift shoulder outward Aggravating factors: trying to reach up, trying to reach hairline in back Relieving factors: na  PRECAUTIONS: Fall and Other: osteoporosis and myasthenia gravis  RED FLAGS: None   WEIGHT BEARING RESTRICTIONS: No  FALLS:  Has patient fallen in last 6 months? Yes. Number of falls 1  LIVING ENVIRONMENT: Lives with: lives with their family and lives alone, with her small dog Lives in: House/apartment Stairs:  No Has following equipment at home: none  OCCUPATION: retired  PLOF: Independent  PATIENT GOALS:return to being able to dress, hang clothes in closet, sleep without pain  NEXT MD VISIT:   OBJECTIVE:  Note: Objective measures were completed at Evaluation unless otherwise noted.  DIAGNOSTIC FINDINGS: copied from 10/04/22, ultrasound by Dr  Limited musculoskeletal ultrasound of the right upper extremity, right  shoulder was performed.  Short axis evaluation of the bicipital groove  shows no cortical irregularity of the humeral head or groove of the  shoulder.  Within the bicipital groove there is a hypoechoic space with  absent visualization of the proximal head of the biceps tendon, indicative  of likely proximal bicep tendon tear.  Short and long axis evaluation of  the biceps tendon does show retraction into the proximal aspect of the  bicep musculature.  Evaluation of the [facility] joint shows at least moderate  arthritic changes without significant bursal distention.   PATIENT SURVEYS: Total SPADI Score: 89 / 130 = 68.5 % Total Disability Score: 49 / 80 = 61.3 %  COGNITION: Overall cognitive status: Within functional limits for tasks assessed     SENSATION: WFL  POSTURE: R shoulder depressed and protracted, loss of muscle mass, palpable change   UPPER EXTREMITY ROM:  AROM R shoulder abd 45 Flexion 90 IR able to reach to top of back pocket ER unable to reach back of neck actively  Passive ROM Right eval Left eval  Shoulder flexion wfl   Shoulder extension    Shoulder abduction 105   Shoulder adduction    Shoulder internal rotation 70   Shoulder external rotation 45   Elbow flexion    Elbow extension    Wrist flexion    Wrist extension    Wrist ulnar deviation    Wrist radial deviation    Wrist pronation    Wrist supination    (Blank rows = not tested)  UPPER EXTREMITY MMT:  MMT Right eval Left eval  Shoulder flexion 3-   Shoulder extension    Shoulder  abduction 2+   Shoulder adduction    Shoulder internal rotation 3-   Shoulder external rotation 3+   Middle trapezius    Lower trapezius    Elbow flexion 4   Elbow extension 4   Wrist flexion    Wrist extension    Wrist ulnar deviation  Wrist radial deviation    Wrist pronation    Wrist supination    Grip strength (lbs)    (Blank rows = not tested)  SHOULDER SPECIAL TESTS:NA   JOINT MOBILITY TESTING:  Na today due to acute pain  PALPATION:  Tender R supraspinatus, coracoid process, subscapularis                                                                                                                             TREATMENT DATE: 06/13/23: Self care: utilized kinesiotaping, 2- I pieces extending from middle and ant delts to middle and upper traps, 35% pull, to provide some support/ stability to the rotator cuff  Instructed to perform isometrics R shoulder as listed below, submaximal contraction and not to perform through pain, also to improve stability R shoulder   PATIENT EDUCATION: Education details: POC, goals Person educated: Patient Education method: Explanation, Demonstration, Tactile cues, Verbal cues, and Handouts Education comprehension: verbalized understanding and returned demonstration  HOME EXERCISE PROGRAM: Access Code: [facility] URL: [email or web address] Date: 06/13/2023 Prepared by: Trula Frede  Exercises - Isometric Shoulder Extension at Wall  - 1 x daily - 7 x weekly - 3 sets - 10 reps - Isometric Shoulder Adduction  - 1 x daily - 7 x weekly - 3 sets - 10 reps - Isometric Shoulder Abduction at Wall  - 1 x daily - 7 x weekly - 3 sets - 10 reps - Isometric Shoulder External Rotation at Wall  - 1 x daily - 7 x weekly - 3 sets - 10 reps  ASSESSMENT:  CLINICAL IMPRESSION: Patient is a 78 y.o. female  who was evaluated today by physical therapy due to R shoulder pain after a traumatic fall, in which she landed on her R shoulder.  She  did have a complete biceps tear on the same shoulder around 10 months ago and went through several sessions of PT at that time.  Noted R shoulder flexion, IR weakness , more pronounced than mild ER weakness.  Today she presents with resting pain, limited sleep, and limitations in her daily activities, dressing, bathing,  due to this R shoulder pain and weakness. She should benefit from skilled PT to address her deficits.  She will see orthopedic surgeon tomorrow and may have a different plan after he evaluates her.  OBJECTIVE IMPAIRMENTS: decreased ROM, decreased strength, impaired perceived functional ability, impaired flexibility, impaired UE functional use, improper body mechanics, postural dysfunction, and pain.   ACTIVITY LIMITATIONS: carrying, lifting, sleeping, bathing, toileting, dressing, reach over head, and caring for others  PARTICIPATION LIMITATIONS: cleaning, laundry, shopping, and yard work  PERSONAL FACTORS: Age, Behavior pattern, Past/current experiences, Time since onset of injury/illness/exacerbation, and 1-2 comorbidities: myasthenia gravis, h/o breast Ca with radiation and resection of thyoma   are also affecting patient's functional outcome.   REHAB POTENTIAL: Good  CLINICAL DECISION MAKING: Evolving/moderate complexity  EVALUATION COMPLEXITY: Moderate   GOALS: Goals reviewed with patient? Yes  SHORT  TERM GOALS: Target date: 2 weeks, 07/25/23  I HEP Baseline: Goal status: INITIAL   LONG TERM GOALS: Target date: 08/08/23 8 weeks:  SPADI, improve disability score from 61.3% to 45% or less Baseline: Total SPADI Score: 89 / 130 = 68.5 % Total Disability Score: 49 / 80 = 61.3 %  Goal status: INITIAL  2.  Improve active Rom R shoulder for ER, pt will be able to reach back of head without having to maximally  flex neck, in order to dress, manage hair care  Baseline: reaches to base of neck with pain Goal status: INITIAL  3.  Increase strength for R shoulder flex, abd  to 4-/5, Er 4+/5, IR 4/5 Baseline: see flow chart above Goal status: INITIAL  4.  Able to elevate R shoulder into flex, abd greater than 110 for ease of ADL's Baseline: flex 90, abd 45 Goal status: INITIAL   PLAN:  PT FREQUENCY: 2x/week  PT DURATION: 8 weeks  PLANNED INTERVENTIONS: 97110-Therapeutic exercises, 97530- Therapeutic activity, 97112- Neuromuscular re-education, 97535- Self Care, and 16109- Manual therapy  PLAN FOR NEXT SESSION: how was KT tape, how did orthopedic appt go, add TPDN, other manual techniques, progress with stabilization ex R shoulder WOULD NOT RECOMMEND IONTOPHORESIS AS PT REPORTED VERY POOR RESPONSE TO STEROID MEDS   Jestina Stephani L Raivyn Kabler, PT, DPT, OCS 06/13/2023, 3:27 PM   Discharge Summary: 07/26/23: Patient has not returned for further Physical Therapy intervention following this evaluation on 06/13/23:  Received word today from orthopedist that she is recommended to have a R reverse total shoulder arthroplasty.  She had much dysfunction at her physical therapy evaluation , with SPADI score of 61%, marked weakness all planes of movement R shoulder, and decreased ROM R shoulder, interfering with all aspects of her daily life as well and especially her sleep.    She previously attended  20 outpatient physical therapy visits from July 2024 to Feb 01 2023 due to R long head biceps tear.  At the time of discharge the therapist noted ongoing pain R shoulder.

## 2023-06-14 DIAGNOSIS — M25811 Other specified joint disorders, right shoulder: Secondary | ICD-10-CM | POA: Diagnosis not present

## 2023-06-22 ENCOUNTER — Encounter: Payer: Self-pay | Admitting: Family Medicine

## 2023-06-29 DIAGNOSIS — G709 Myoneural disorder, unspecified: Secondary | ICD-10-CM | POA: Diagnosis not present

## 2023-06-29 DIAGNOSIS — G7001 Myasthenia gravis with (acute) exacerbation: Secondary | ICD-10-CM | POA: Diagnosis not present

## 2023-06-30 DIAGNOSIS — G7001 Myasthenia gravis with (acute) exacerbation: Secondary | ICD-10-CM | POA: Diagnosis not present

## 2023-06-30 DIAGNOSIS — G709 Myoneural disorder, unspecified: Secondary | ICD-10-CM | POA: Diagnosis not present

## 2023-07-03 ENCOUNTER — Telehealth: Payer: Self-pay

## 2023-07-03 NOTE — Telephone Encounter (Signed)
 Looking at [employer] you mentioned the pt coming in next week. Does she need to come in sooner?     Copied from CRM [id number]. Topic: General - Other >> Jul 03, 2023 11:19 AM [place] J wrote: Reason for CRM: Patient is calling needing surgical clearance but Dr. Geralyn Knee  is out of office until the 16th. Patient was wondering if there's any one else who would be able to clear her and send over the results of her stress test that she took back in October.

## 2023-07-03 NOTE — Telephone Encounter (Signed)
   Pre-operative Risk Assessment    Patient Name: Faith Massey  DOB: May 28, 1945 MRN: [id number]   Date of last office visit: NONE Date of next office visit: NONE   Request for Surgical Clearance    Procedure:   RIGHT SHOULDER ARTHOPLASTY   Date of Surgery:  Clearance TBD                                Surgeon:  DR. KEVIN SUPPLE Surgeon's Group or Practice Name:  [facility] Phone number:  [phone] Fax number:  [phone]   Type of Clearance Requested:   - Medical  - Pharmacy:  Hold Aspirin and Apixaban (Eliquis) NEEDS INSTRUCTIONS WHEN TO HOLD   Type of Anesthesia:  General    Additional requests/questions:    Signed, Vira Grieves   07/03/2023, 11:40 AM

## 2023-07-03 NOTE — Telephone Encounter (Signed)
   Name: Mysti Haley  DOB: October 14, 1945  MRN: [id number]  Primary Cardiologist: None  Chart reviewed as part of pre-operative protocol coverage. Because of Jovee Coburn's past medical history and time since last visit, she will require a NEW PATIENT in-office visit in order to better assess preoperative cardiovascular risk.  Pre-op covering staff: - Please schedule appointment and call patient to inform them. If patient already had an upcoming appointment within acceptable timeframe, please add "pre-op clearance" to the appointment notes so provider is aware. - Please contact requesting surgeon's office via preferred method (i.e, phone, fax) to inform them of need for appointment prior to surgery.  Aspirin and Eliquis are not managed by cardiology service.  Ava Boatman, NP  07/03/2023, 12:18 PM

## 2023-07-03 NOTE — Telephone Encounter (Signed)
 I will send a message to our scheduling team to reach out to the pt with a new pt appt. Not sure if the requesting office has sent over a referral for new pt appt. I will send a note to surgeon's office as well need new pt referral for preop clearance.

## 2023-07-04 NOTE — Telephone Encounter (Signed)
 We dont have one. I called their office yesterday and requested one. Im looking out for it.

## 2023-07-04 NOTE — Telephone Encounter (Signed)
Form has been received and placed in folder.

## 2023-07-05 ENCOUNTER — Encounter (INDEPENDENT_AMBULATORY_CARE_PROVIDER_SITE_OTHER): Payer: Self-pay | Admitting: Family Medicine

## 2023-07-05 ENCOUNTER — Ambulatory Visit (HOSPITAL_BASED_OUTPATIENT_CLINIC_OR_DEPARTMENT_OTHER)
Admission: RE | Admit: 2023-07-05 | Discharge: 2023-07-05 | Disposition: A | Discharge status: Home / Self Care | Source: Ambulatory Visit | Attending: Family Medicine | Admitting: Family Medicine

## 2023-07-05 DIAGNOSIS — M25551 Pain in right hip: Secondary | ICD-10-CM | POA: Diagnosis not present

## 2023-07-06 DIAGNOSIS — R490 Dysphonia: Secondary | ICD-10-CM | POA: Diagnosis not present

## 2023-07-06 NOTE — Telephone Encounter (Signed)
 Form has been faxed to [facility]

## 2023-07-06 NOTE — Telephone Encounter (Signed)
 Please see the MyChart message reply(ies) for my assessment and plan.  The patient gave consent for this Medical Advice Message and is aware that it may result in a bill to their insurance company as well as the possibility that this may result in a co-payment or deductible. They are an established patient, but are not seeking medical advice exclusively about a problem treated during an in person or video visit in the last 7 days. I did not recommend an in person or video visit within 7 days of my reply.  I spent a total of 15 minutes cumulative time within 7 days through [employer] Abbe Amsterdam, MD

## 2023-07-07 NOTE — Telephone Encounter (Signed)
 Pt has a new pt appt with Dr. Revankar 07/19/23. I will update all parties involved.

## 2023-07-07 NOTE — Telephone Encounter (Signed)
 Leah from our scheduling team: I left her a VM to c/b

## 2023-07-11 ENCOUNTER — Ambulatory Visit (INDEPENDENT_AMBULATORY_CARE_PROVIDER_SITE_OTHER): Payer: Medicare HMO

## 2023-07-11 VITALS — Ht 59.0 in | Wt 114.0 lb

## 2023-07-11 DIAGNOSIS — Z0000 Encounter for general adult medical examination without abnormal findings: Secondary | ICD-10-CM

## 2023-07-11 NOTE — Patient Instructions (Addendum)
 Ms. Tuley , Thank you for taking time to come for your Medicare Wellness Visit. I appreciate your ongoing commitment to your health goals. Please review the following plan we discussed and let me know if I can assist you in the future.   Referrals/Orders/Follow-Ups/Clinician Recommendations:   This is a list of the screening recommended for you and due dates:  Health Maintenance  Topic Date Due   COVID-19 Vaccine (1) Never done   Hepatitis C Screening  Never done   DTaP/Tdap/Td vaccine (1 - Tdap) Never done   Zoster (Shingles) Vaccine (1 of 2) Never done   DEXA scan (bone density measurement)  Never done   Pneumonia Vaccine (2 of 2 - PCV) 10/03/2013   Flu Shot  10/20/2023   Medicare Annual Wellness Visit  07/10/2024   HPV Vaccine  Aged Out   Meningitis B Vaccine  Aged Out   Colon Cancer Screening  Discontinued   Opioid Pain Medicine Management Opioids are powerful medicines that are used to treat moderate to severe pain. When used for short periods of time, they can help you to: Sleep better. Do better in physical or occupational therapy. Feel better in the first few days after an injury. Recover from surgery. Opioids should be taken with the supervision of a trained health care provider. They should be taken for the shortest period of time possible. This is because opioids can be addictive, and the longer you take opioids, the greater your risk of addiction. This addiction can also be called opioid use disorder. What are the risks? Using opioid pain medicines for longer than 3 days increases your risk of side effects. Side effects include: Constipation. Nausea and vomiting. Breathing difficulties (respiratory depression). Drowsiness. Confusion. Opioid use disorder. Itching. Taking opioid pain medicine for a long period of time can affect your ability to do daily tasks. It also puts you at risk for: Motor vehicle crashes. Depression. Suicide. Heart attack. Overdose, which can  be life-threatening. What is a pain treatment plan? A pain treatment plan is an agreement between you and your health care provider. Pain is unique to each person, and treatments vary depending on your condition. To manage your pain, you and your health care provider need to work together. To help you do this: Discuss the goals of your treatment, including how much pain you might expect to have and how you will manage the pain. Review the risks and benefits of taking opioid medicines. Remember that a good treatment plan uses more than one approach and minimizes the chance of side effects. Be honest about the amount of medicines you take and about any drug or alcohol use. Get pain medicine prescriptions from only one health care provider. Pain can be managed with many types of alternative treatments. Ask your health care provider to refer you to one or more specialists who can help you manage pain through: Physical or occupational therapy. Counseling (cognitive behavioral therapy). Good nutrition. Biofeedback. Massage. Meditation. Non-opioid medicine. Following a gentle exercise program. How to use opioid pain medicine Taking medicine Take your pain medicine exactly as told by your health care provider. Take it only when you need it. If your pain gets less severe, you may take less than your prescribed dose if your health care provider approves. If you are not having pain, do nottake pain medicine unless your health care provider tells you to take it. If your pain is severe, do nottry to treat it yourself by taking more pills than instructed on  your prescription. Contact your health care provider for help. Write down the times when you take your pain medicine. It is easy to become confused while on pain medicine. Writing the time can help you avoid overdose. Take other over-the-counter or prescription medicines only as told by your health care provider. Keeping yourself and others  safe  While you are taking opioid pain medicine: Do not drive, use machinery, or power tools. Do not sign legal documents. Do not drink alcohol. Do not take sleeping pills. Do not supervise children by yourself. Do not do activities that require climbing or being in high places. Do not go to a lake, river, ocean, spa, or swimming pool. Do not share your pain medicine with anyone. Keep pain medicine in a locked cabinet or in a secure area where pets and children cannot reach it. Stopping your use of opioids If you have been taking opioid medicine for more than a few weeks, you may need to slowly decrease (taper) how much you take until you stop completely. Tapering your use of opioids can decrease your risk of symptoms of withdrawal, such as: Pain and cramping in the abdomen. Nausea. Sweating. Sleepiness. Restlessness. Uncontrollable shaking (tremors). Cravings for the medicine. Do not attempt to taper your use of opioids on your own. Talk with your health care provider about how to do this. Your health care provider may prescribe a step-down schedule based on how much medicine you are taking and how long you have been taking it. Getting rid of leftover pills Do not save any leftover pills. Get rid of leftover pills safely by: Taking the medicine to a prescription take-back program. This is usually offered by the county or law enforcement. Bringing them to a pharmacy that has a drug disposal container. Flushing them down the toilet. Check the label or package insert of your medicine to see whether this is safe to do. Throwing them out in the trash. Check the label or package insert of your medicine to see whether this is safe to do. If it is safe to throw it out, remove the medicine from the original container, put it into a sealable bag or container, and mix it with used coffee grounds, food scraps, dirt, or cat litter before putting it in the trash. Follow these instructions at  home: Activity Do exercises as told by your health care provider. Avoid activities that make your pain worse. Return to your normal activities as told by your health care provider. Ask your health care provider what activities are safe for you. General instructions You may need to take these actions to prevent or treat constipation: Drink enough fluid to keep your urine pale yellow. Take over-the-counter or prescription medicines. Eat foods that are high in fiber, such as beans, whole grains, and fresh fruits and vegetables. Limit foods that are high in fat and processed sugars, such as fried or sweet foods. Keep all follow-up visits. This is important. Where to find support If you have been taking opioids for a long time, you may benefit from receiving support for quitting from a local support group or counselor. Ask your health care provider for a referral to these resources in your area. Where to find more information Centers for Disease Control and Prevention (CDC): FootballExhibition.com.br [place] Food and Drug Administration (FDA): PumpkinSearch.com.ee Get help right away if: You may have taken too much of an opioid (overdosed). Common symptoms of an overdose: Your breathing is slower or more shallow than normal. You have a  very slow heartbeat (pulse). You have slurred speech. You have nausea and vomiting. Your pupils become very small. You have other potential symptoms: You are very confused. You faint or feel like you will faint. You have cold, clammy skin. You have blue lips or fingernails. You have thoughts of harming yourself or harming others. These symptoms may represent a serious problem that is an emergency. Do not wait to see if the symptoms will go away. Get medical help right away. Call your local emergency services (911 in the [place] Do not drive yourself to the hospital.  If you ever feel like you may hurt yourself or others, or have thoughts about taking your own life, get help right away.  Go to your nearest emergency department or: Call your local emergency services (911 in the [place] Call the [facility] (502 519 2356 in the [place] Call a suicide crisis helpline, such as the National Suicide Prevention Lifeline at (986) 468-4297 or 988 in the [place] This is open 24 hours a day in the [place] If you're a Veteran: Call 988 and press 1. This is open 24 hours a day. Text the [employer] at 272-412-4504. Summary Opioid medicines can help you manage moderate to severe pain for a short period of time. A pain treatment plan is an agreement between you and your health care provider. Discuss the goals of your treatment, including how much pain you might expect to have and how you will manage the pain. If you think that you or someone else may have taken too much of an opioid, get medical help right away. This information is not intended to replace advice given to you by your health care provider. Make sure you discuss any questions you have with your health care provider. Document Revised: 12/12/2022 Document Reviewed: 06/17/2020 Elsevier Patient Education  2024 Elsevier Inc. Advanced directives: (Copy Requested) Please bring a copy of your health care power of attorney and living will to the office to be added to your chart at your convenience. You can mail to [facility] [address] [place], [place] [address] or email to [email or web address]  Next Medicare Annual Wellness Visit scheduled for next year: Yes

## 2023-07-11 NOTE — Progress Notes (Addendum)
 Subjective:   Faith Massey is a 78 y.o. who presents for a Medicare Wellness preventive visit.  Visit Complete: Virtual I connected with  Faith Massey on 07/11/23 by a audio enabled telemedicine application and verified that I am speaking with the correct person using two identifiers.  Patient Location: Home  Provider Location: Home Office  I discussed the limitations of evaluation and management by telemedicine. The patient expressed understanding and agreed to proceed.  Vital Signs: Because this visit was a virtual/telehealth visit, some criteria may be missing or patient reported. Any vitals not documented were not able to be obtained and vitals that have been documented are patient reported.    Persons Participating in Visit: Patient.  AWV Questionnaire: Yes: Patient Medicare AWV questionnaire was completed by the patient on 07/04/23; I have confirmed that all information answered by patient is correct and no changes since this date.  Cardiac Risk Factors include: advanced age (>63men, >65 women);hypertension     Objective:    Today's Vitals   07/11/23 1056  Weight: 114 lb (51.7 kg)  Height: 4\' 11"  (1.499 m)   Body mass index is 23.03 kg/m.     07/11/2023   11:06 AM 06/13/2023    3:02 PM 05/16/2023   12:18 PM 03/29/2023    3:09 PM 02/11/2023   12:47 PM 02/08/2023    7:32 AM 02/03/2023   11:36 AM  Advanced Directives  Does Patient Have a Medical Advance Directive? Yes Yes Yes Yes No Yes Yes  Type of Estate agent of [place];Living will  Healthcare Power of [place];Living will Healthcare Power of [place];Living will  Healthcare Power of [place];Living will Healthcare Power of [place];Living will  Does patient want to make changes to medical advance directive?  No - Patient declined No - Patient declined  No - Patient declined No - Patient declined No - Patient declined  Copy of Healthcare Power of Attorney in Chart? No - copy requested  No - copy  requested No - copy requested  No - copy requested No - copy requested  Would patient like information on creating a medical advance directive?   No - Patient declined No - Patient declined No - Patient declined      Current Medications (verified) Outpatient Encounter Medications as of 07/11/2023  Medication Sig   acetaminophen  (TYLENOL ) 650 MG CR tablet Take 1,300 mg by mouth every 8 (eight) hours as needed for pain.   apixaban  (ELIQUIS ) 2.5 MG TABS tablet Take 1 tablet (2.5 mg total) by mouth 2 (two) times daily.   aspirin  EC 81 MG tablet Take 1 tablet (81 mg total) by mouth daily. Swallow whole. (Patient taking differently: Take 81 mg by mouth 3 (three) times a week. Swallow whole.)   BLACK ELDERBERRY PO Take 1 capsule by mouth daily as needed (immune support).   Cholecalciferol (VITAMIN D-3) 5000 UNITS TABS Take 5,000 Units by mouth daily.   clotrimazole  (MYCELEX ) 10 MG troche Take 1 tablet (10 mg total) by mouth 5 (five) times daily.   COLLAGEN PO Take 1 Scoop by mouth daily.   ibuprofen  (ADVIL ) 400 MG tablet Take 1 tablet (400 mg total) by mouth every 6 (six) hours as needed for mild pain (pain score 1-3).   lisinopril  (ZESTRIL ) 40 MG tablet Take 1 tablet (40 mg total) by mouth daily.   Melatonin 10 MG CAPS Take 10 mg by mouth at bedtime.   metronidazole  (NORITATE ) 1 % cream Apply topically daily. Use as needed for skin  breakout   Multiple Vitamins-Minerals (MULTIVITAMIN WITH MINERALS) tablet Take 1 tablet by mouth daily.   nystatin  (MYCOSTATIN ) 100000 UNIT/ML suspension Take 5 mLs (500,000 Units total) by mouth as needed (thrush). Swish, hold in mouth and spit up to 4x a day as needed   OCTAGAM 20 GM/200ML SOLN Inject 50 g into the vein. 03/29/2023 Takes 50 grams for 2 days every 3 weeks.   Probiotic Product (PROBIOTIC PO) Take 1-2 capsules by mouth daily.   pyridostigmine  (MESTINON ) 60 MG tablet Take 1 tablet (60 mg total) by mouth 4 (four) times daily as needed.   traMADol  (ULTRAM ) 50  MG tablet Take 1 tablet (50 mg total) by mouth every 6 (six) hours as needed.   No facility-administered encounter medications on file as of 07/11/2023.    Allergies (verified) Prednisone    History: Past Medical History:  Diagnosis Date   Arthritis    Breast CA (HCC)    History of radiation therapy    Chest 06/09/2022 - 07/21/2022 - Dr. Retta Caster   Hyperlipidemia    Hypertension    Laceration of muscle, fascia and tendon of long head of biceps, unspecified arm, initial encounter    right arm  involving rotator  cuff   Myasthenia gravis (HCC)    Dx'd 03/2022   Osteoporosis 10/12/2012   Pneumonia    Pre-diabetes    Past Surgical History:  Procedure Laterality Date   ABDOMINAL HYSTERECTOMY  03/22/2003   BACK SURGERY  03/21/1998   BREAST SURGERY     reconstruction   CYSTOSCOPY WITH STENT PLACEMENT N/A 02/08/2023   Procedure: POSSIBLE CYSTOSCOPY WITH URETERAL STENT PLACEMENT;  Surgeon: Melody Spurling., MD;  Location: [facility];  Service: Urology;  Laterality: N/A;   MASTECTOMY Bilateral 03/21/1993   RESECTION OF A THYMOMA     04-25-22   Family History  Problem Relation Age of Onset   Stroke Mother    Hypertension Mother    Myasthenia gravis Mother    Hypertension Father    Colon cancer Neg Hx    Esophageal cancer Neg Hx    Social History   Socioeconomic History   Marital status: Divorced    Spouse name: Not on file   Number of children: Not on file   Years of education: Not on file   Highest education level: Bachelor's degree (e.g., BA, AB, BS)  Occupational History   Not on file  Tobacco Use   Smoking status: Never    Passive exposure: Never   Smokeless tobacco: Never  Vaping Use   Vaping status: Never Used  Substance and Sexual Activity   Alcohol use: Not Currently    Alcohol/week: 1.0 standard drink of alcohol    Types: 1 Glasses of wine per week    Comment: occasional   very rare   Drug use: No   Sexual activity: Not Currently  Other Topics Concern    Not on file  Social History Narrative   Lives alone   Right handed   Caffeine- 2 cups of coffee   Social Drivers of Health   Financial Resource Strain: Medium Risk (07/11/2023)   Overall Financial Resource Strain (CARDIA)    Difficulty of Paying Living Expenses: Somewhat hard  Food Insecurity: No Food Insecurity (07/11/2023)   Hunger Vital Sign    Worried About Running Out of Food in the Last Year: Never true    Ran Out of Food in the Last Year: Never true  Transportation Needs: No Transportation Needs (07/11/2023)  PRAPARE - Administrator, Civil Service (Medical): No    Lack of Transportation (Non-Medical): No  Physical Activity: Insufficiently Active (07/11/2023)   Exercise Vital Sign    Days of Exercise per Week: 7 days    Minutes of Exercise per Session: 10 min  Stress: Stress Concern Present (07/11/2023)   [employer] of Occupational Health - Occupational Stress Questionnaire    Feeling of Stress : To some extent  Social Connections: Moderately Integrated (07/11/2023)   Social Connection and Isolation Panel [NHANES]    Frequency of Communication with Friends and Family: More than three times a week    Frequency of Social Gatherings with Friends and Family: Twice a week    Attends Religious Services: More than 4 times per year    Active Member of [employer] or Organizations: Yes    Attends Engineer, structural: More than 4 times per year    Marital Status: Divorced    Tobacco Counseling Counseling given: Not Answered    Clinical Intake:  Pre-visit preparation completed: Yes  Pain : No/denies pain     BMI - recorded: 23.03 Nutritional Status: BMI of 19-24  Normal Nutritional Risks: None Diabetes: No  Lab Results  Component Value Date   HGBA1C 5.3 09/12/2022   HGBA1C 6.1 (H) 05/11/2022   HGBA1C 5.7 (H) 03/28/2022     How often do you need to have someone help you when you read instructions, pamphlets, or other written materials from  your doctor or pharmacy?: 1 - Never  Interpreter Needed?: No  Information entered by :: Farris Hong LPN   Activities of Daily Living     07/11/2023   11:03 AM 07/04/2023   11:06 AM  In your present state of health, do you have any difficulty performing the following activities:  Hearing? 0 0  Vision? 0 0  Difficulty concentrating or making decisions? 0 0  Walking or climbing stairs? 0 0  Dressing or bathing? 0 0  Doing errands, shopping? 0 0  Preparing Food and eating ? N N  Using the Toilet? N N  In the past six months, have you accidently leaked urine? N N  Do you have problems with loss of bowel control? N N  Managing your Medications? N N  Managing your Finances? Colie Dawes  Housekeeping or managing your Housekeeping? Colie Dawes    Patient Care Team: Copland, Skipper Dumas, MD as PCP - General (Family Medicine) Ivor Mars, MD as Medical Oncologist (Oncology)  Indicate any recent Medical Services you may have received from other than [facility] providers in the past year (date may be approximate).     Assessment:   This is a routine wellness examination for Lillias.  Hearing/Vision screen Hearing Screening - Comments:: Denies hearing difficulties   Vision Screening - Comments::  - Not up to date with routine eye exams with Dr Radionchenko.    Goals Addressed               This Visit's Progress     Remain Active (pt-stated)         Depression Screen     07/11/2023   11:12 AM 09/14/2022    3:20 PM 06/22/2022   10:32 AM 05/25/2022    1:40 PM 05/12/2022    3:17 PM  PHQ 2/9 Scores  PHQ - 2 Score 0 0 0 0 0    Fall Risk     07/11/2023   11:04 AM 07/04/2023   11:06 AM  06/21/2022   10:54 AM 05/25/2022    1:40 PM  Fall Risk   Falls in the past year? 1 1 0 0  Number falls in past yr: 0 0 0 0  Injury with Fall? 1 1 0 0  Comment Rt Shoulder injury. Followed by medical attention     Risk for fall due to : No Fall Risks  No Fall Risks No Fall Risks  Follow up Falls prevention  discussed;Falls evaluation completed  Falls evaluation completed Falls evaluation completed    MEDICARE RISK AT HOME:  Medicare Risk at Home Any stairs in or around the home?: No If so, are there any without handrails?: No Home free of loose throw rugs in walkways, pet beds, electrical cords, etc?: Yes Adequate lighting in your home to reduce risk of falls?: Yes Life alert?: No Use of a cane, walker or w/c?: No Grab bars in the bathroom?: Yes Shower chair or bench in shower?: No Elevated toilet seat or a handicapped toilet?: Yes  TIMED UP AND GO:  Was the test performed?  No  Cognitive Function: 6CIT completed        07/11/2023   11:06 AM 06/22/2022   10:43 AM  6CIT Screen  What Year? 0 points 0 points  What month? 0 points 0 points  What time? 0 points 0 points  Count back from 20 0 points 0 points  Months in reverse 0 points 0 points  Repeat phrase 0 points 0 points  Total Score 0 points 0 points    Immunizations Immunization History  Administered Date(s) Administered   Pneumococcal Polysaccharide-23 10/03/2012    Screening Tests Health Maintenance  Topic Date Due   COVID-19 Vaccine (1) Never done   Hepatitis C Screening  Never done   DTaP/Tdap/Td (1 - Tdap) Never done   Zoster Vaccines- Shingrix (1 of 2) Never done   DEXA SCAN  Never done   Pneumonia Vaccine 65+ Years old (2 of 2 - PCV) 10/03/2013   INFLUENZA VACCINE  10/20/2023   Medicare Annual Wellness (AWV)  07/10/2024   HPV VACCINES  Aged Out   Meningococcal B Vaccine  Aged Out   Colonoscopy  Discontinued    Health Maintenance  Health Maintenance Due  Topic Date Due   COVID-19 Vaccine (1) Never done   Hepatitis C Screening  Never done   DTaP/Tdap/Td (1 - Tdap) Never done   Zoster Vaccines- Shingrix (1 of 2) Never done   DEXA SCAN  Never done   Pneumonia Vaccine 4+ Years old (2 of 2 - PCV) 10/03/2013   Health Maintenance Items Addressed: Hepatitis C Screening, Dexa Scan, and vaccines  Deferred  Additional Screening:  Vision Screening: Recommended annual ophthalmology exams for early detection of glaucoma and other disorders of the eye.  Dental Screening: Recommended annual dental exams for proper oral hygiene  Community Resource Referral / Chronic Care Management: CRR required this visit?  No   CCM required this visit?  No     Plan:     I have personally reviewed and noted the following in the patient's chart:   Medical and social history Use of alcohol, tobacco or illicit drugs  Current medications and supplements including opioid prescriptions. Patient is currently taking opioid prescriptions. Information provided to patient regarding non-opioid alternatives. Patient advised to discuss non-opioid treatment plan with their provider. Functional ability and status Nutritional status Physical activity Advanced directives List of other physicians Hospitalizations, surgeries, and ER visits in previous 12 months Vitals Screenings  to include cognitive, depression, and falls Referrals and appointments  In addition, I have reviewed and discussed with patient certain preventive protocols, quality metrics, and best practice recommendations. A written personalized care plan for preventive services as well as general preventive health recommendations were provided to patient.     Dewayne Ford, LPN   [id number]   After Visit Summary: (MyChart) Due to this being a telephonic visit, the after visit summary with patients personalized plan was offered to patient via MyChart   Notes: Nothing significant to report at this time.

## 2023-07-17 ENCOUNTER — Encounter: Payer: Self-pay | Admitting: Cardiology

## 2023-07-17 DIAGNOSIS — S46129A Laceration of muscle, fascia and tendon of long head of biceps, unspecified arm, initial encounter: Secondary | ICD-10-CM | POA: Insufficient documentation

## 2023-07-17 DIAGNOSIS — C50919 Malignant neoplasm of unspecified site of unspecified female breast: Secondary | ICD-10-CM | POA: Insufficient documentation

## 2023-07-17 DIAGNOSIS — M1990 Unspecified osteoarthritis, unspecified site: Secondary | ICD-10-CM | POA: Insufficient documentation

## 2023-07-17 DIAGNOSIS — G7000 Myasthenia gravis without (acute) exacerbation: Secondary | ICD-10-CM | POA: Insufficient documentation

## 2023-07-17 DIAGNOSIS — E785 Hyperlipidemia, unspecified: Secondary | ICD-10-CM | POA: Insufficient documentation

## 2023-07-17 DIAGNOSIS — J189 Pneumonia, unspecified organism: Secondary | ICD-10-CM | POA: Insufficient documentation

## 2023-07-19 ENCOUNTER — Encounter: Payer: Self-pay | Admitting: Cardiology

## 2023-07-19 ENCOUNTER — Ambulatory Visit: Attending: Cardiology | Admitting: Cardiology

## 2023-07-19 VITALS — BP 112/58 | HR 96 | Ht 59.0 in | Wt 112.0 lb

## 2023-07-19 DIAGNOSIS — S46129A Laceration of muscle, fascia and tendon of long head of biceps, unspecified arm, initial encounter: Secondary | ICD-10-CM

## 2023-07-19 DIAGNOSIS — Z86711 Personal history of pulmonary embolism: Secondary | ICD-10-CM | POA: Diagnosis not present

## 2023-07-19 DIAGNOSIS — G7000 Myasthenia gravis without (acute) exacerbation: Secondary | ICD-10-CM | POA: Diagnosis not present

## 2023-07-19 DIAGNOSIS — E782 Mixed hyperlipidemia: Secondary | ICD-10-CM

## 2023-07-19 DIAGNOSIS — S46129D Laceration of muscle, fascia and tendon of long head of biceps, unspecified arm, subsequent encounter: Secondary | ICD-10-CM | POA: Diagnosis not present

## 2023-07-19 DIAGNOSIS — I63321 Cerebral infarction due to thrombosis of right anterior cerebral artery: Secondary | ICD-10-CM | POA: Diagnosis not present

## 2023-07-19 DIAGNOSIS — M1990 Unspecified osteoarthritis, unspecified site: Secondary | ICD-10-CM

## 2023-07-19 DIAGNOSIS — Z01810 Encounter for preprocedural cardiovascular examination: Secondary | ICD-10-CM | POA: Diagnosis not present

## 2023-07-19 NOTE — Patient Instructions (Signed)
Medication Instructions:   Your physician recommends that you continue on your current medications as directed. Please refer to the Current Medication list given to you today.  *If you need a refill on your cardiac medications before your next appointment, please call your pharmacy*   Lab Work:  None Ordered  If you have labs (blood work) drawn today and your tests are completely normal, you will receive your results only by: MyChart Message (if you have MyChart) OR A paper copy in the mail If you have any lab test that is abnormal or we need to change your treatment, we will call you to review the results.   Testing/Procedures:  None Ordered  Follow-Up: At [facility], you and your health needs are our priority.  As part of our continuing mission to provide you with exceptional heart care, we have created designated Provider Care Teams.  These Care Teams include your primary Cardiologist (physician) and Advanced Practice Providers (APPs -  Physician Assistants and Nurse Practitioners) who all work together to provide you with the care you need, when you need it.  We recommend signing up for the patient portal called "MyChart".  Sign up information is provided on this After Visit Summary.  MyChart is used to connect with patients for Virtual Visits (Telemedicine).  Patients are able to view lab/test results, encounter notes, upcoming appointments, etc.  Non-urgent messages can be sent to your provider as well.   To learn more about what you can do with MyChart, go to [email or web address].    Your next appointment:  AS NEEDED     

## 2023-07-19 NOTE — Progress Notes (Signed)
 Cardiology Office Note:    Date:  07/19/2023   ID:  Faith Massey, DOB 03-03-1946, MRN [id number]  PCP:  Kaylee Partridge, MD  Cardiologist:  Nelia Balzarine, MD   Referring MD: Ellard Gunning, MD    ASSESSMENT:    1. Arthritis   2. Laceration of muscle, fascia and tendon of long head of biceps, unspecified arm, initial encounter   3. Myasthenia gravis (HCC)   4. 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 History of pulmonary embolism   6. Preoperative cardiovascular examination   7. Mixed hyperlipidemia    PLAN:    In order of problems listed above:  Primary prevention stressed with the patient.  Importance of compliance with diet medication stressed and patient verbalized standing. Preoperative cardiovascular evaluation: I discussed her evaluation done late last year.  This was unremarkable.  Even the monitoring was unremarkable.  She has good effort tolerance.  She tells me that she walks the dog several times a day 15 to 20 minutes with fairly brisk walking without any symptoms.  In view of this history I do not think she is at high risk for coronary events during the aforementioned surgery.  Meticulous hemodynamic monitoring will further reduce the risk of coronary events. Lipids are mildly elevated and I discussed this in diet with her.  She will follow-up about this with her primary care. She will be seen in follow-up appointment on a as needed basis.  Patient had multiple questions which were answered to her satisfaction.   Medication Adjustments/Labs and Tests Ordered: Current medicines are reviewed at length with the patient today.  Concerns regarding medicines are outlined above.  Orders Placed This Encounter  Procedures   EKG 12-Lead   No orders of the defined types were placed in this encounter.    History of Present Illness:    Faith Massey is a 79 y.o. female who is being seen today for the evaluation of preop  cardiovascular evaluation at the request of Ellard Gunning, MD. patient is a pleasant 78 year old female.  She has past medical history of mild dyslipidemia.  She is an active lady.  She tells me that she walks her dog for 15 to 20 minutes without any problems.  She is planning to undergo shoulder replacement surgery.  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  Before that she had a complete evaluation including a stress test which was unremarkable.  She denies any chest pain orthopnea or PND.  At the time of my evaluation, the patient is alert awake oriented and in no distress.  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 Retta Caster   Hoarseness of voice 05/09/2023   HTN (hypertension) 03/28/2022   Hyperlipidemia    Hypertension    Laceration of muscle, fascia and tendon of long head of biceps, unspecified arm, initial encounter    right arm  involving rotator  cuff   Myasthenia gravis (HCC)    Dx'd 03/2022   Myasthenic syndrome (HCC) 04/12/2022   Osteoporosis 10/12/2012  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 10/16/2012   Pneumonia    Pre-diabetes    Prediabetes 05/25/2022   Pyelonephritis 11/10/2022   Right knee injury 02/14/2017   S/P radiation therapy 05/09/2023   S/P Robotic Assisted Right Video Thoracoscopy with resection of Thymus 04/25/2022   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   apixaban  (ELIQUIS ) 2.5 MG TABS tablet Take 1 tablet (2.5 mg total) by mouth 2 (two) times daily.   aspirin  EC 81 MG tablet Take 1 tablet (81 mg total) by mouth daily. Swallow whole.   BLACK ELDERBERRY PO Take 1 capsule by mouth daily as needed (immune support).   Cholecalciferol (VITAMIN D-3) 5000 UNITS TABS Take 5,000 Units by mouth daily.   clotrimazole  (MYCELEX ) 10 MG troche Take 1 tablet (10 mg total) by mouth 5 (five) times daily.   COLLAGEN PO Take 1 Scoop by mouth daily.   ibuprofen  (ADVIL ) 400 MG tablet Take 1 tablet (400 mg total) by mouth every 6 (six) hours as needed for mild pain (pain score 1-3).   lisinopril  (ZESTRIL ) 40 MG tablet Take 1 tablet (40 mg total) by mouth daily.   Melatonin 10 MG CAPS Take 10 mg by mouth at bedtime.   Multiple Vitamins-Minerals (MULTIVITAMIN WITH MINERALS) tablet Take 1 tablet by mouth daily.   nystatin  (MYCOSTATIN ) 100000 UNIT/ML suspension Take 5 mLs (500,000 Units total) by mouth as needed (thrush). Swish, hold in mouth and spit up to 4x a day as needed   OCTAGAM 20 GM/200ML SOLN Inject 50 g into the vein. 03/29/2023 Takes 50 grams for 2 days every 3 weeks.   Probiotic Product (PROBIOTIC PO) Take 1-2 capsules by mouth daily.   pyridostigmine  (MESTINON ) 60 MG tablet Take 1 tablet (60 mg total) by mouth 4 (four) times daily as needed.   traMADol  (ULTRAM ) 50 MG tablet Take 1 tablet (50 mg total) by mouth every 6 (six) hours as needed.     Allergies:   Prednisone    Social History   Socioeconomic History   Marital status: Divorced     Spouse name: Not on file   Number of children: Not on file   Years of education: Not on file   Highest education level: Bachelor's degree (e.g., BA, AB, BS)  Occupational History   Not on file  Tobacco Use   Smoking status: Never    Passive exposure: Never   Smokeless tobacco: Never  Vaping Use   Vaping status: Never Used  Substance and Sexual Activity   Alcohol use: Not Currently    Alcohol/week: 1.0 standard drink of alcohol    Types: 1 Glasses of wine per week    Comment: occasional   very rare   Drug use: No   Sexual activity: Not Currently  Other Topics Concern   Not on file  Social History Narrative   Lives alone   Right handed   Caffeine- 2 cups of coffee   Social Drivers of Health   Financial Resource Strain: Medium Risk (07/11/2023)   Overall Financial Resource Strain (CARDIA)    Difficulty of Paying Living Expenses: Somewhat hard  Food Insecurity: No Food Insecurity (07/11/2023)   Hunger Vital Sign    Worried About Running Out of Food in the Last Year: Never true    Ran Out of Food in the Last Year: Never true  Transportation Needs: No Transportation Needs (07/11/2023)   PRAPARE - Administrator, Civil Service (Medical): No    Lack of Transportation (Non-Medical): No  Physical Activity: Insufficiently Active (07/11/2023)   Exercise Vital Sign    Days of Exercise per Week: 7 days    Minutes of Exercise per Session: 10 min  Stress: Stress Concern Present (07/11/2023)   [employer] of Occupational Health - Occupational Stress Questionnaire    Feeling of Stress : To some extent  Social Connections: Moderately Integrated (07/11/2023)   Social Connection and Isolation Panel [NHANES]    Frequency of Communication with Friends and Family: More than three times a week    Frequency of Social Gatherings with Friends and Family: Twice a week    Attends Religious Services: More than 4 times per year    Active Member of [employer] or Organizations: Yes    Attends  Engineer, structural: More than 4 times per year    Marital Status: Divorced     Family History: The patient's family history includes Hypertension in her father and mother; Myasthenia gravis in her mother; Stroke in her mother. There is no history of Colon cancer or Esophageal cancer.  ROS:   Please see the history of present illness.    All other systems reviewed and are negative.  EKGs/Labs/Other Studies Reviewed:    The following studies were reviewed today:  EKG Interpretation Date/Time:  Wednesday July 19 2023 11:16:17 EDT Ventricular Rate:  96 PR Interval:  144 QRS Duration:  64 QT Interval:  330 QTC Calculation: 416 R Axis:   63  Text Interpretation: Normal sinus rhythm Normal ECG When compared with ECG of 14-Jul-2022 17:53, PREVIOUS ECG IS PRESENT Confirmed by Hillis Lu [phone]) on 07/19/2023 11:25:26 AM     Recent Labs: 09/12/2022: TSH 0.848 11/14/2022: Magnesium 2.0 05/16/2023: ALT 19; BUN 28; Creatinine 0.81; Hemoglobin 12.1; Platelet Count 285; Potassium 5.1; Sodium 142  Recent Lipid Panel    Component Value Date/Time   CHOL 200 03/29/2022 0512   TRIG 70 03/29/2022 0512   HDL 61 03/29/2022 0512   CHOLHDL 3.3 03/29/2022 0512   VLDL 14 03/29/2022 0512   LDLCALC 125 (H) 03/29/2022 0512    Physical Exam:    VS:  BP (!) 112/58   Pulse 96   Ht 4\' 11"  (1.499 m)   Wt 112 lb (50.8 kg)   SpO2 96%   BMI 22.62 kg/m     Wt Readings from Last 3 Encounters:  07/19/23 112 lb (50.8 kg)  07/11/23 114 lb (51.7 kg)  05/16/23 111 lb 12.8 oz (50.7 kg)     GEN: Patient is in no acute distress HEENT: Normal NECK: No JVD; No carotid bruits LYMPHATICS: No lymphadenopathy CARDIAC: S1 S2 regular, 2/6 systolic murmur at the apex. RESPIRATORY:  Clear to auscultation without rales, wheezing or rhonchi  ABDOMEN: Soft, non-tender, non-distended MUSCULOSKELETAL:  No edema; No deformity  SKIN: Warm and dry NEUROLOGIC:  Alert and oriented x 3 PSYCHIATRIC:   Normal affect    Signed, Nelia Balzarine, MD  07/19/2023 11:53 AM    [facility]

## 2023-07-20 ENCOUNTER — Encounter (HOSPITAL_BASED_OUTPATIENT_CLINIC_OR_DEPARTMENT_OTHER): Payer: Self-pay

## 2023-07-20 ENCOUNTER — Emergency Department (HOSPITAL_BASED_OUTPATIENT_CLINIC_OR_DEPARTMENT_OTHER)

## 2023-07-20 ENCOUNTER — Encounter: Payer: Self-pay | Admitting: Family Medicine

## 2023-07-20 ENCOUNTER — Other Ambulatory Visit: Payer: Self-pay

## 2023-07-20 ENCOUNTER — Ambulatory Visit (HOSPITAL_COMMUNITY): Admit: 2023-07-20 | Admitting: Cardiovascular Disease

## 2023-07-20 ENCOUNTER — Observation Stay (HOSPITAL_BASED_OUTPATIENT_CLINIC_OR_DEPARTMENT_OTHER)
Admission: EM | Admit: 2023-07-20 | Discharge: 2023-07-21 | Disposition: A | Discharge status: Home / Self Care | Attending: Cardiovascular Disease | Admitting: Cardiovascular Disease

## 2023-07-20 ENCOUNTER — Encounter (HOSPITAL_COMMUNITY)
Admission: EM | Disposition: A | Discharge status: Home / Self Care | Payer: Self-pay | Source: Home / Self Care | Attending: Cardiovascular Disease

## 2023-07-20 ENCOUNTER — Ambulatory Visit: Admitting: Family Medicine

## 2023-07-20 VITALS — BP 110/64 | HR 98 | Temp 98.7°F | Ht 59.0 in | Wt 111.6 lb

## 2023-07-20 DIAGNOSIS — R9431 Abnormal electrocardiogram [ECG] [EKG]: Principal | ICD-10-CM | POA: Insufficient documentation

## 2023-07-20 DIAGNOSIS — Z853 Personal history of malignant neoplasm of breast: Secondary | ICD-10-CM | POA: Insufficient documentation

## 2023-07-20 DIAGNOSIS — Z7901 Long term (current) use of anticoagulants: Secondary | ICD-10-CM | POA: Diagnosis not present

## 2023-07-20 DIAGNOSIS — R072 Precordial pain: Secondary | ICD-10-CM

## 2023-07-20 DIAGNOSIS — Z79899 Other long term (current) drug therapy: Secondary | ICD-10-CM | POA: Insufficient documentation

## 2023-07-20 DIAGNOSIS — E785 Hyperlipidemia, unspecified: Secondary | ICD-10-CM | POA: Diagnosis not present

## 2023-07-20 DIAGNOSIS — Z7982 Long term (current) use of aspirin: Secondary | ICD-10-CM | POA: Diagnosis not present

## 2023-07-20 DIAGNOSIS — G7000 Myasthenia gravis without (acute) exacerbation: Secondary | ICD-10-CM | POA: Diagnosis present

## 2023-07-20 DIAGNOSIS — Z86711 Personal history of pulmonary embolism: Secondary | ICD-10-CM | POA: Insufficient documentation

## 2023-07-20 DIAGNOSIS — G709 Myoneural disorder, unspecified: Secondary | ICD-10-CM | POA: Diagnosis not present

## 2023-07-20 DIAGNOSIS — Z8673 Personal history of transient ischemic attack (TIA), and cerebral infarction without residual deficits: Secondary | ICD-10-CM | POA: Diagnosis not present

## 2023-07-20 DIAGNOSIS — I213 ST elevation (STEMI) myocardial infarction of unspecified site: Principal | ICD-10-CM

## 2023-07-20 DIAGNOSIS — R5381 Other malaise: Secondary | ICD-10-CM | POA: Diagnosis not present

## 2023-07-20 DIAGNOSIS — R079 Chest pain, unspecified: Secondary | ICD-10-CM

## 2023-07-20 DIAGNOSIS — G7001 Myasthenia gravis with (acute) exacerbation: Secondary | ICD-10-CM | POA: Diagnosis not present

## 2023-07-20 DIAGNOSIS — R0602 Shortness of breath: Secondary | ICD-10-CM

## 2023-07-20 DIAGNOSIS — R7303 Prediabetes: Secondary | ICD-10-CM | POA: Diagnosis not present

## 2023-07-20 DIAGNOSIS — R12 Heartburn: Secondary | ICD-10-CM | POA: Diagnosis not present

## 2023-07-20 DIAGNOSIS — I10 Essential (primary) hypertension: Secondary | ICD-10-CM | POA: Diagnosis present

## 2023-07-20 HISTORY — PX: LEFT HEART CATH AND CORONARY ANGIOGRAPHY: CATH118249

## 2023-07-20 LAB — CBC WITH DIFFERENTIAL/PLATELET
Abs Immature Granulocytes: 0.05 10*3/uL (ref 0.00–0.07)
Basophils Absolute: 0 10*3/uL (ref 0.0–0.1)
Basophils Relative: 0 %
Eosinophils Absolute: 0.1 10*3/uL (ref 0.0–0.5)
Eosinophils Relative: 1 %
HCT: 33.8 % — ABNORMAL LOW (ref 36.0–46.0)
Hemoglobin: 11.2 g/dL — ABNORMAL LOW (ref 12.0–15.0)
Immature Granulocytes: 1 %
Lymphocytes Relative: 12 %
Lymphs Abs: 1.3 10*3/uL (ref 0.7–4.0)
MCH: 31.7 pg (ref 26.0–34.0)
MCHC: 33.1 g/dL (ref 30.0–36.0)
MCV: 95.8 fL (ref 80.0–100.0)
Monocytes Absolute: 1.4 10*3/uL — ABNORMAL HIGH (ref 0.1–1.0)
Monocytes Relative: 13 %
Neutro Abs: 7.8 10*3/uL — ABNORMAL HIGH (ref 1.7–7.7)
Neutrophils Relative %: 73 %
Platelets: 269 10*3/uL (ref 150–400)
RBC: 3.53 MIL/uL — ABNORMAL LOW (ref 3.87–5.11)
RDW: 13.5 % (ref 11.5–15.5)
WBC: 10.6 10*3/uL — ABNORMAL HIGH (ref 4.0–10.5)
nRBC: 0 % (ref 0.0–0.2)

## 2023-07-20 LAB — POCT URINALYSIS DIP (MANUAL ENTRY)
Bilirubin, UA: NEGATIVE
Blood, UA: NEGATIVE
Glucose, UA: NEGATIVE mg/dL
Nitrite, UA: NEGATIVE
Protein Ur, POC: NEGATIVE mg/dL
Spec Grav, UA: 1.01 (ref 1.010–1.025)
Urobilinogen, UA: 0.2 U/dL
pH, UA: 6 (ref 5.0–8.0)

## 2023-07-20 LAB — BASIC METABOLIC PANEL WITH GFR
Anion gap: 16 — ABNORMAL HIGH (ref 5–15)
BUN: 26 mg/dL — ABNORMAL HIGH (ref 8–23)
CO2: 19 mmol/L — ABNORMAL LOW (ref 22–32)
Calcium: 9.6 mg/dL (ref 8.9–10.3)
Chloride: 102 mmol/L (ref 98–111)
Creatinine, Ser: 0.8 mg/dL (ref 0.44–1.00)
GFR, Estimated: >60 mL/min (ref >60)
Glucose, Bld: 108 mg/dL — ABNORMAL HIGH (ref 70–99)
Potassium: 4.1 mmol/L (ref 3.5–5.1)
Sodium: 137 mmol/L (ref 135–145)

## 2023-07-20 LAB — TROPONIN T, HIGH SENSITIVITY: Troponin T High Sensitivity: 35 ng/L — ABNORMAL HIGH (ref <19)

## 2023-07-20 LAB — POC COVID19 BINAXNOW: SARS Coronavirus 2 Ag: NEGATIVE

## 2023-07-20 LAB — PRO BRAIN NATRIURETIC PEPTIDE: Pro Brain Natriuretic Peptide: 727 pg/mL — ABNORMAL HIGH (ref <300.0)

## 2023-07-20 MED ORDER — APIXABAN 2.5 MG PO TABS
2.5000 mg | ORAL_TABLET | Freq: Two times a day (BID) | ORAL | Status: DC
  Administered 2023-07-21: 2.5 mg via ORAL
  Filled 2023-07-20: qty 1

## 2023-07-20 MED ORDER — ONDANSETRON HCL 4 MG/2ML IJ SOLN: 4.0000 mg | Freq: Four times a day (QID) | INTRAMUSCULAR | Status: DC | PRN

## 2023-07-20 MED ORDER — LIDOCAINE HCL (PF) 1 % IJ SOLN
INTRAMUSCULAR | Status: DC | PRN
  Administered 2023-07-20: 15 mL

## 2023-07-20 MED ORDER — HEPARIN SODIUM (PORCINE) 5000 UNIT/ML IJ SOLN
60.0000 [IU]/kg | Freq: Once | INTRAMUSCULAR | Status: DC
  Filled 2023-07-20: qty 1

## 2023-07-20 MED ORDER — HEPARIN SODIUM (PORCINE) 5000 UNIT/ML IJ SOLN
3000.0000 [IU] | Freq: Once | INTRAMUSCULAR | Status: AC
  Administered 2023-07-20: 3000 [IU] via INTRAVENOUS

## 2023-07-20 MED ORDER — IOHEXOL 350 MG/ML SOLN
INTRAVENOUS | Status: DC | PRN
  Administered 2023-07-20: 40 mL

## 2023-07-20 MED ORDER — ASPIRIN 81 MG PO CHEW
324.0000 mg | CHEWABLE_TABLET | Freq: Once | ORAL | Status: AC
  Administered 2023-07-20: 324 mg via ORAL
  Filled 2023-07-20: qty 4

## 2023-07-20 MED ORDER — HYDRALAZINE HCL 20 MG/ML IJ SOLN: 10.0000 mg | INTRAMUSCULAR | Status: AC | PRN

## 2023-07-20 MED ORDER — SODIUM CHLORIDE 0.9 % IV SOLN: 250.0000 mL | INTRAVENOUS | Status: DC | PRN

## 2023-07-20 MED ORDER — SODIUM CHLORIDE 0.9% FLUSH
3.0000 mL | Freq: Two times a day (BID) | INTRAVENOUS | Status: DC
  Administered 2023-07-21: 3 mL via INTRAVENOUS

## 2023-07-20 MED ORDER — SODIUM CHLORIDE 0.9 % IV SOLN
INTRAVENOUS | Status: DC
  Administered 2023-07-20: 20 mL via INTRAVENOUS

## 2023-07-20 MED ORDER — ASPIRIN 81 MG PO TBEC
81.0000 mg | DELAYED_RELEASE_TABLET | Freq: Every day | ORAL | Status: DC
  Administered 2023-07-21: 81 mg via ORAL
  Filled 2023-07-20: qty 1

## 2023-07-20 MED ORDER — LISINOPRIL 10 MG PO TABS: 40.0000 mg | ORAL_TABLET | Freq: Every day | ORAL | Status: DC

## 2023-07-20 MED ORDER — SODIUM CHLORIDE 0.9% FLUSH: 3.0000 mL | INTRAVENOUS | Status: DC | PRN

## 2023-07-20 MED ORDER — MELATONIN 5 MG PO TABS
10.0000 mg | ORAL_TABLET | Freq: Every day | ORAL | Status: DC
  Administered 2023-07-20: 10 mg via ORAL
  Filled 2023-07-20: qty 2

## 2023-07-20 MED ORDER — HEPARIN (PORCINE) IN NACL 1000-0.9 UT/500ML-% IV SOLN
INTRAVENOUS | Status: DC | PRN
Start: 2023-07-20 — End: 2023-07-20
  Administered 2023-07-20: 1000 mL
  Administered 2023-07-20: 500 mL

## 2023-07-20 MED ORDER — HEPARIN (PORCINE) 25000 UT/250ML-% IV SOLN
600.0000 [IU]/h | INTRAVENOUS | Status: DC
  Filled 2023-07-20: qty 250

## 2023-07-20 MED ORDER — LIDOCAINE HCL (PF) 1 % IJ SOLN
INTRAMUSCULAR | Status: AC
Start: 2023-07-20 — End: ?
  Filled 2023-07-20: qty 30

## 2023-07-20 MED ORDER — ACETAMINOPHEN 325 MG PO TABS: 650.0000 mg | ORAL_TABLET | ORAL | Status: DC | PRN

## 2023-07-20 MED ORDER — SODIUM CHLORIDE 0.9 % WEIGHT BASED INFUSION
1.0000 mL/kg/h | INTRAVENOUS | Status: AC
  Administered 2023-07-20: 1 mL/kg/h via INTRAVENOUS

## 2023-07-20 NOTE — Progress Notes (Signed)
 ANTICOAGULATION CONSULT NOTE  Pharmacy Consult for Heparin  Indication:  STEMI  Allergies  Allergen Reactions   Prednisone  Shortness Of Breath, Swelling, Palpitations, Dermatitis and Hypertension     Reports medication caused swelling and metallic taste in mouth.   Patient Measurements: Weight: 51.1 kg (112 lb 9.6 oz) Heparin  Dosing Weight: 50.6 kg  Vital Signs: Temp: 98.7 F (37.1 C) (05/01 1612) Temp Source: Oral (05/01 1612) BP: 126/54 (05/01 1612) Pulse Rate: 90 (05/01 1612)  Labs: Recent Labs    07/20/23 1621  HGB 11.2*  HCT 33.8*  PLT 269    CrCl cannot be calculated (Patient's most recent lab result is older than the maximum 21 days allowed.).   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 Retta Caster   Hoarseness of voice 05/09/2023   HTN (hypertension) 03/28/2022   Hyperlipidemia    Hypertension    Laceration of muscle, fascia and tendon of long head of biceps, unspecified arm, initial encounter    right arm  involving rotator  cuff   Myasthenia gravis (HCC)    Dx'd 03/2022   Myasthenic syndrome (HCC) 04/12/2022   Osteoporosis 10/12/2012   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 10/16/2012   Pneumonia    Pre-diabetes    Prediabetes 05/25/2022   Pyelonephritis 11/10/2022   Right knee injury 02/14/2017   S/P radiation therapy 05/09/2023   S/P Robotic Assisted Right Video Thoracoscopy with resection of Thymus 04/25/2022   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 Patient is presenting with SOB x 2 days. Heparin  per pharmacy consult placed for  stemi .  Patient is on apixaban  prior to arrival. Last dose 5/1 "sometime this morning". Will require aPTT monitoring due to likely falsely high anti-Xa level secondary to DOAC use.  Hgb 11.2; plt 269  Goal of Therapy:  Heparin  level 0.3-0.7 units/ml aPTT 66-102 seconds Monitor platelets by anticoagulation protocol: Yes   Plan:  Give 3000 units IV heparin  bolus (Per [place] ok to bolus despite eliquis  this AM) Start heparin  infusion at 600 units/hr Patient is transferring from [facility] to [facility] for Cath Lab for STEMI - pharmacy to f/u plan post-cath  Dionicio Fray, PharmD, BCPS 07/20/2023 4:59 PM ED Clinical Pharmacist -  [phone]

## 2023-07-20 NOTE — ED Provider Notes (Addendum)
 [place] [facility] Provider Note   CSN: [id number] Arrival date & time: 07/20/23  1607     History Thymoma, breast CA, myasthenia gravis, CVA on eliquis  Chief Complaint  Patient presents with   Shortness of Breath    Faith Massey is a 78 y.o. female.  78 year old female with a past medical history of thymoma, HTN, myasthenia gravis presents to the ED with a chief complaint of shortness of breath for the last 2 days.  Patient reports he has felt overall rundown for the past 2 days, had an episode last night where she felt substernal chest pain of pressure that took her breath away, woken up from her sleep, tried Tums, tried vinegar, try over-the-counter medication and states that she "I was ready to call the ambulance".  She has been feeling very rundown, states she took her temperature yesterday and had a low-grade temp of right under 100.  She was seen by cardiology yesterday for a surgical clearance, had an EKG at Dr. Harlan Liberty at her office. Today evaluate by Dr. Allison Arena who also obtained an EKG and looked abnormal therefore patient was sent to the ED. Here, she continues to endorse pain, has not taken anything today for relieve. Did take her eliquis  sometime this morning. No nausea, no vomiting or headaches.   The history is provided by the patient.  Shortness of Breath Severity:  Moderate Onset quality:  Sudden Duration:  1 day Associated symptoms: chest pain and fever   Associated symptoms: no abdominal pain and no vomiting        Home Medications Prior to Admission medications   Medication Sig Start Date End Date Taking? Authorizing Provider  acetaminophen  (TYLENOL ) 650 MG CR tablet Take 1,300 mg by mouth every 8 (eight) hours as needed for pain.   Yes [provider]  apixaban  (ELIQUIS ) 2.5 MG TABS tablet Take 1 tablet (2.5 mg total) by mouth 2 (two) times daily. 02/11/23  Yes Joyce Nixon, MD  aspirin  EC 325 MG tablet Take 325  mg by mouth at bedtime as needed.   Yes [provider]  Cholecalciferol (VITAMIN D-3) 5000 UNITS TABS Take 5,000 Units by mouth daily.   Yes [provider]  COLLAGEN PO Take 1 Scoop by mouth daily.   Yes [provider]  lisinopril  (ZESTRIL ) 40 MG tablet Take 1 tablet (40 mg total) by mouth daily. 04/17/23  Yes Copland, Skipper Dumas, MD  Melatonin 10 MG CAPS Take 10 mg by mouth at bedtime.   Yes [provider]  Multiple Vitamins-Minerals (MULTIVITAMIN WITH MINERALS) tablet Take 1 tablet by mouth daily.   Yes [provider]  Probiotic Product (PROBIOTIC PO) Take 1-2 capsules by mouth daily.   Yes [provider]  pyridostigmine  (MESTINON ) 60 MG tablet Take 1 tablet (60 mg total) by mouth 4 (four) times daily as needed. 03/08/23  Yes Phebe Brasil, MD  traMADol  (ULTRAM ) 50 MG tablet Take 1 tablet (50 mg total) by mouth every 6 (six) hours as needed. 02/10/23  Yes Joyce Nixon, MD  aspirin  EC 81 MG tablet Take 1 tablet (81 mg total) by mouth daily. Swallow whole. 03/30/22   Krishnan, Gokul, MD  BLACK ELDERBERRY PO Take 1 capsule by mouth daily as needed (immune support).    [provider]  clotrimazole  (MYCELEX ) 10 MG troche Take 1 tablet (10 mg total) by mouth 5 (five) times daily. Patient not taking: Reported on 07/20/2023 11/17/22   Copland, Skipper Dumas, MD  ibuprofen  (ADVIL ) 400 MG tablet Take 1 tablet (400 mg total) by mouth every 6 (six) hours as needed for mild pain (pain score 1-3). Patient not taking: Reported on 07/20/2023 02/10/23   Joyce Nixon, MD  nystatin  (MYCOSTATIN ) 100000 UNIT/ML suspension Take 5 mLs (500,000 Units total) by mouth as needed (thrush). Swish, hold in mouth and spit up to 4x a day as needed Patient not taking: Reported on 07/20/2023 02/27/23   Copland, Skipper Dumas, MD  OCTAGAM 20 GM/200ML SOLN Inject 50 g into the vein. 03/29/2023 Takes 50 grams for 2 days every 3 weeks. 12/13/22   [provider]       Allergies    Prednisone     Review of Systems   Review of Systems  Constitutional:  Positive for fever.  Respiratory:  Positive for shortness of breath.   Cardiovascular:  Positive for chest pain.  Gastrointestinal:  Negative for abdominal pain, nausea and vomiting.  Genitourinary:  Negative for flank pain.  Musculoskeletal:  Negative for back pain.  All other systems reviewed and are negative.   Physical Exam Updated Vital Signs BP 121/63   Pulse 85   Temp 98.1 F (36.7 C) (Oral)   Resp 20   Ht 4\' 11"  (1.499 m)   Wt 52 kg   SpO2 99%   BMI 23.15 kg/m  Physical Exam Vitals and nursing note reviewed.  Constitutional:      General: She is not in acute distress.    Appearance: She is well-developed. She is not ill-appearing.  HENT:     Head: Normocephalic and atraumatic.  Cardiovascular:     Rate and Rhythm: Normal rate.  Pulmonary:     Effort: Pulmonary effort is normal. No tachypnea.     Breath sounds: No wheezing, rhonchi or rales.  Chest:    Abdominal:     Palpations: Abdomen is soft. There is no mass.     Tenderness: There is no abdominal tenderness.  Musculoskeletal:     Cervical back: Normal range of motion and neck supple.     Right lower leg: No edema.     Left lower leg: No edema.  Skin:    General: Skin is warm and dry.  Neurological:     Mental Status: She is alert and oriented to person, place, and time.     ED Results / Procedures / Treatments   Labs (all labs ordered are listed, but only abnormal results are displayed) Labs Reviewed  BASIC METABOLIC PANEL WITH GFR - Abnormal; Notable for the following components:      Result Value   CO2 19 (*)    Glucose, Bld 108 (*)    BUN 26 (*)    Anion gap 16 (*)    All other components within normal limits  CBC WITH DIFFERENTIAL/PLATELET - Abnormal; Notable for the following components:   WBC 10.6 (*)    RBC 3.53 (*)    Hemoglobin 11.2 (*)    HCT 33.8 (*)    Neutro Abs 7.8 (*)    Monocytes  Absolute 1.4 (*)    All other components within normal limits  PRO BRAIN NATRIURETIC PEPTIDE - Abnormal; Notable for the following components:   Pro Brain Natriuretic Peptide 727.0 (*)    All other components within normal limits  TROPONIN T, HIGH SENSITIVITY - Abnormal; Notable for the following components:   Troponin T High Sensitivity 35 (*)    All other components within normal limits  MRSA NEXT GEN BY PCR, NASAL  HEMOGLOBIN A1C  LIPID PANEL  BASIC METABOLIC PANEL WITH GFR  SEDIMENTATION RATE  C-REACTIVE PROTEIN    EKG EKG Interpretation Date/Time:  Thursday Jul 20 2023 16:25:15 EDT Ventricular Rate:  85 PR Interval:  138 QRS Duration:  81 QT Interval:  358 QTC Calculation: 426 R Axis:   59  Text Interpretation: Sinus rhythm Inferior infarct, acute (LCx) Lateral leads are also involved diffuse ST elevation, acute ischemic appearance compared to older tracings Confirmed by Wynetta Heckle [phone]) on 07/20/2023 4:31:56 PM  Radiology CARDIAC CATHETERIZATION Result Date: 07/20/2023 No angiographic evidence of CAD Recommendations: Monitor overnight. EKG changes likely due to pericarditis. Echo tomorrow.   DG Chest Portable 1 View Result Date: 07/20/2023 CLINICAL DATA:  Abnormal EKG. Shortness of breath and heartburn beginning last night. EXAM: PORTABLE CHEST 1 VIEW COMPARISON:  CT chest 04/05/2023 FINDINGS: Heart size and pulmonary vascularity are normal. Lungs are clear. No pleural effusion or pneumothorax. Mediastinal contours appear intact. Degenerative changes in the spine and shoulders. IMPRESSION: No active disease. Electronically Signed   By: Boyce Byes M.D.   On: 07/20/2023 17:33    Procedures .Critical Care  Performed by: Randy Castrejon, PA-C Authorized by: Xue Low, PA-C   Critical care provider statement:    Critical care time (minutes):  35   Critical care start time:  07/20/2023 4:30 AM   Critical care end time:  07/20/2023 5:13 PM   Critical care was  necessary to treat or prevent imminent or life-threatening deterioration of the following conditions:  Cardiac failure   Critical care was time spent personally by me on the following activities:  Development of treatment plan with patient or surrogate, discussions with consultants, evaluation of patient's response to treatment, examination of patient, ordering and review of laboratory studies, ordering and review of radiographic studies, ordering and performing treatments and interventions, pulse oximetry, re-evaluation of patient's condition and review of old charts   Care discussed with: admitting provider       Medications Ordered in ED Medications  apixaban  (ELIQUIS ) tablet 2.5 mg (has no administration in time range)  aspirin  EC tablet 81 mg (has no administration in time range)  lisinopril  (ZESTRIL ) tablet 40 mg (has no administration in time range)  hydrALAZINE  (APRESOLINE ) injection 10 mg (has no administration in time range)  acetaminophen  (TYLENOL ) tablet 650 mg (has no administration in time range)  ondansetron  (ZOFRAN ) injection 4 mg (has no administration in time range)  0.9% sodium chloride  infusion (1 mL/kg/hr  51.1 kg Intravenous New Bag/Given 07/20/23 1855)  sodium chloride  flush (NS) 0.9 % injection 3 mL (has no administration in time range)  sodium chloride  flush (NS) 0.9 % injection 3 mL (has no administration in time range)  0.9 %  sodium chloride  infusion (has no administration in time range)  melatonin tablet 10 mg (10 mg Oral Given 07/20/23 2312)  aspirin  chewable tablet 324 mg (324 mg Oral Given 07/20/23 1659)  heparin  injection 3,000 Units (3,000 Units Intravenous Given 07/20/23 1710)    ED Course/ Medical Decision Making/ A&P Clinical Course as of 07/20/23 2351  Thu Jul 20, 2023  1718 Troponin T High Sensitivity(!): 35 [JS]  1719 Pro Brain Natriuretic Peptide(!): 727.0 [JS]    Clinical Course User Index [JS] Jaivon Vanbeek, PA-C                                  Medical Decision Making Amount and/or Complexity of Data Reviewed  Labs: ordered. Decision-making details documented in ED Course. Radiology: ordered.  Risk OTC drugs. Prescription drug management.   This patient presents to the ED for concern of SOB, this involves a number of treatment options, and is a complaint that carries with it a high risk of complications and morbidity.  The differential diagnosis includes MI, PE versus infection.    Co morbidities: Discussed in HPI   Brief History:  SEE HPI.   EMR reviewed including pt PMHx, past surgical history and past visits to ER.   See HPI for more details   Lab Tests:  I ordered and independently interpreted labs.  The pertinent results include:    Labs notable for CBC with a slight leukocytosis of 10.6, hemoglobin is probably decreased from her baseline but stable.  BMP with no electrolyte derangement, creatinine levels unremarkable.  Troponin, lactic acid are pending.   Imaging Studies:  NAD. I personally reviewed all imaging studies and no acute abnormality found. I agree with radiology interpretation.  Cardiac Monitoring:  The patient was maintained on a cardiac monitor.  I personally viewed and interpreted the cardiac monitored which showed an underlying rhythm of: Diffused ST elevations, changed from prior   Medicines ordered:  I ordered medication including heparin   for anticoagulation Reevaluation of the patient after these medicines showed that the patient stayed the same I have reviewed the patients home medicines and have made adjustments as needed  Critical Interventions:  Patient given a heparin  bolus, she is anticoagulated on Eliquis  and did have this this morning but will proceed after clarification with cardiologist.  Consults:  I requested consultation with Dr. Abel Hoe,  and discussed lab and imaging findings as well as pertinent plan - they recommend: heparin  along with transfer to [facility].    Reevaluation:  After the interventions noted above I re-evaluated patient and found that they have :stayed the same  Social Determinants of Health:  The patient's social determinants of health were a factor in the care of this patient  Problem List / ED Course:  Patient presents to the ED with a chief complaint of shortness of breath, chest pain episode that occurred last night while she was in her bed, reports this was very severe nature, took her breath away.  She did try to take some antacid medication, although this did not feel like heartburn.  She did have a an EKG yesterday at Dr. Barclay Leyden office for a preop appointment.  Today she was evaluated by Dr. Allison Arena, she continued to feel weak, has felt "out of energy "for the last 2 days.  She continues to endorse pain along the substernal area.  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  On arrival heart rate is 90, she is normotensive, no hypoxia, no tachypnea, she does not appear in distress but continues to endorse pain. EKG obtained showed diffuse ST elevations, troponin is 35, no prior level for comparison.  CBC with no leukocytosis.  Hemoglobin stable.  BMP with no electrolyte derangement.  Does have an elevated BNP at 727, does not appear volume overloaded on exam.  I reached out to Dr. Abel Hoe of cardiology for a possible STEMI with EKG with diffuse ST elevations, along with patient continues pain. I discussed this case with my attending Dr. Mittie Anchors, who also evaluated patient and agrees with management.  Patient was given a heparin  infusion, she did take her Eliquis  this morning but cardiology recommended further bolus with anticoagulation. She was  informed of the plan and procedure.  She is agreeable of plan this time, remains hemodynamically stable for transfer.  Dispostion:  After consideration of the diagnostic results and the patients response to treatment, I feel that the patent  would benefit from trasnfer to [facility] for further management of her STEMI.     Portions of this note were generated with Scientist, clinical (histocompatibility and immunogenetics). Dictation errors may occur despite best attempts at proofreading.   Final Clinical Impression(s) / ED Diagnoses Final diagnoses:  ST elevation myocardial infarction (STEMI), unspecified artery [facility])    Rx / DC Orders ED Discharge Orders     None         Luellen Sages, PA-C 07/20/23 1729    Luellen Sages, PA-C 07/20/23 2351    Wynetta Heckle, MD 07/21/23 2337

## 2023-07-20 NOTE — Progress Notes (Signed)
 [facility] [address], [address] [place], [place] [address] [phone]  Date:  07/20/2023   Name:  Faith Massey   DOB:  1945/09/03   MRN:  [id number]  PCP:  Kaylee Partridge, MD    Chief Complaint: Follow-up (Pt states "the last few evenings" she's been having trouble with acid reflux /Pt states she feels as though something is "stuck" in her chest /Fatigue, low grade fever, chills /Pt was seen by cardiology yesterday and had a EKG done /"I could not get a full breath in")   History of Present Illness:  Faith Massey is a 78 y.o. very pleasant female patient who presents with the following:  Pt seen today with concern of not feeling well- she reached out earlier today via mychart Last visit with myself was in December-  History of hypertension, CVA, thymoma, myasthenia gravis, remote breast cancer 1995, hyperlipidemia, osteoporosis, pre-diabetes, pulmonary embolism March 2024 Earlier this summer she had concern of air coming from her bladder.  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  Per Mychart: Last evening I felt really bad. I had severe heart burn and nothing seemed to help. I took Tums, ACV with honey, etc and nothing  seemed to help. I was feverish and my shoulders hurt very badly. The pain was so severe that I comtemplated called an ambulance! Since I'm not much for doing things like that, I hesitated. It was after 3 AM before I could even get comfortable to sleep. I'm still feverish and uncomfortable. What should I do? Oh, and by the way, I have an IVIG treatment this afternoon at 3 PM.   Over the last couple of days she has felt like she had "heartburn" in the evening She had a low grade temp to 99.7 today  No cough She has noted chills and aches Last night she had a hard time sleeping as she just felt so bad.   She was having a hard time breathing due to pain with  breathing and feeling like her chest was heavy last night She had a recent cardiac eval which was negative,  she does have history of PE and is on anticoagulation   She has felt really tired the last couple of days   No vomiting Poor appetite No ST  She does an IVIG treatment every 3 weeks for her MG She is having a right shoulder replacement at the end of this month   She did see cardiology yesterday - she was sent by Dr Alfredo Ano prior to her surgery.  Cardiology did not find any cause of concern   Negative myoview  about 6 months ago   The study is normal. The study is low risk.   No ST deviation was noted.   LV perfusion is normal.   Left ventricular function is normal. End diastolic cavity size is normal. End systolic cavity size is normal.   Prior study not available for comparison. Normal perfusion. LVEF 71% with normal wall motion. This is a low risk study. No prior for comparison.  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   S/P Robotic Assisted Right Video Thoracoscopy with resection of Thymus 04/25/2022   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   Myasthenic syndrome (HCC) 04/12/2022   CVA (cerebral vascular accident) (HCC) 03/28/2022   HTN (hypertension) 03/28/2022   Chest mass 03/28/2022   Right knee injury 02/14/2017   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 10/16/2012   Osteoporosis 10/12/2012    Past Medical History:  Diagnosis Date    Arthritis    Breast CA (HCC)    Chest mass 03/28/2022   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 Retta Caster   Hoarseness of voice 05/09/2023   HTN (hypertension) 03/28/2022   Hyperlipidemia    Hypertension    Laceration of muscle, fascia and tendon of long head of biceps, unspecified arm, initial encounter    right arm  involving rotator  cuff   Myasthenia gravis (HCC)    Dx'd 03/2022   Myasthenic syndrome (HCC) 04/12/2022   Osteoporosis 10/12/2012   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 10/16/2012   Pneumonia    Pre-diabetes    Prediabetes 05/25/2022   Pyelonephritis 11/10/2022   Right knee injury 02/14/2017   S/P radiation therapy 05/09/2023   S/P Robotic Assisted Right Video Thoracoscopy with resection of Thymus 04/25/2022   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    Past Surgical History:  Procedure Laterality Date   ABDOMINAL HYSTERECTOMY  03/22/2003   BACK SURGERY  03/21/1998   BREAST SURGERY     reconstruction   CYSTOSCOPY WITH STENT PLACEMENT N/A 02/08/2023   Procedure: POSSIBLE CYSTOSCOPY WITH URETERAL STENT PLACEMENT;  Surgeon: Melody Spurling., MD;  Location: [facility];  Service: Urology;  Laterality: N/A;   MASTECTOMY Bilateral 03/21/1993   RESECTION OF A THYMOMA     04-25-22    Social History   Tobacco Use   Smoking status: Never    Passive exposure: Never   Smokeless tobacco: Never  Vaping Use   Vaping status: Never Used  Substance Use Topics   Alcohol use: Not Currently    Alcohol/week: 1.0 standard drink of alcohol    Types: 1 Glasses of wine per week    Comment: occasional   very rare   Drug use: No    Family History  Problem Relation Age of Onset   Stroke Mother    Hypertension Mother    Myasthenia gravis Mother     Hypertension Father    Colon cancer Neg Hx    Esophageal cancer Neg Hx     Allergies  Allergen Reactions   Prednisone  Shortness Of Breath, Swelling, Palpitations, Dermatitis and Hypertension     Reports medication caused swelling and metallic taste in mouth.    Medication list has been reviewed and updated.  Current Outpatient Medications on File Prior to Visit  Medication Sig Dispense Refill   acetaminophen  (TYLENOL ) 650 MG CR tablet Take 1,300 mg by mouth every 8 (eight) hours as needed for pain.     apixaban  (ELIQUIS ) 2.5 MG TABS tablet Take 1 tablet (2.5 mg total) by  mouth 2 (two) times daily.     aspirin  EC 81 MG tablet Take 1 tablet (81 mg total) by mouth daily. Swallow whole. 30 tablet 12   BLACK ELDERBERRY PO Take 1 capsule by mouth daily as needed (immune support).     Cholecalciferol (VITAMIN D-3) 5000 UNITS TABS Take 5,000 Units by mouth daily.     clotrimazole  (MYCELEX ) 10 MG troche Take 1 tablet (10 mg total) by mouth 5 (five) times daily. 35 Troche 1   COLLAGEN PO Take 1 Scoop by mouth daily.     ibuprofen  (ADVIL ) 400 MG tablet Take 1 tablet (400 mg total) by mouth every 6 (six) hours as needed for mild pain (pain score 1-3).     lisinopril  (ZESTRIL ) 40 MG tablet Take 1 tablet (40 mg total) by mouth daily. 90 tablet 2   Melatonin 10 MG CAPS Take 10 mg by mouth at bedtime.     Multiple Vitamins-Minerals (MULTIVITAMIN WITH MINERALS) tablet Take 1 tablet by mouth daily.     nystatin  (MYCOSTATIN ) 100000 UNIT/ML suspension Take 5 mLs (500,000 Units total) by mouth as needed (thrush). Swish, hold in mouth and spit up to 4x a day as needed 120 mL 2   OCTAGAM 20 GM/200ML SOLN Inject 50 g into the vein. 03/29/2023 Takes 50 grams for 2 days every 3 weeks.     Probiotic Product (PROBIOTIC PO) Take 1-2 capsules by mouth daily.     pyridostigmine  (MESTINON ) 60 MG tablet Take 1 tablet (60 mg total) by mouth 4 (four) times daily as needed. 90 tablet 6   traMADol  (ULTRAM ) 50 MG tablet  Take 1 tablet (50 mg total) by mouth every 6 (six) hours as needed. 20 tablet 0   No current facility-administered medications on file prior to visit.    Review of Systems:  As per HPI- otherwise negative.   Physical Examination: Vitals:   07/20/23 1509  BP: 110/64  Pulse: 98  Temp: 98.7 F (37.1 C)  SpO2: 100%   Vitals:   07/20/23 1509  Weight: 111 lb 9.6 oz (50.6 kg)  Height: 4\' 11"  (1.499 m)   Body mass index is 22.54 kg/m. Ideal Body Weight: Weight in (lb) to have BMI = 25: 123.5  GEN: no acute distress. Petite build, looks well  HEENT: Atraumatic, Normocephalic.  Bilateral TM wnl, oropharynx normal.  PEERL,EOMI.   Ears and Nose: No external deformity. CV: RRR, No M/G/R. No JVD. No thrill. No extra heart sounds. PULM: CTA B, no wheezes, crackles, rhonchi. No retractions. No resp. distress. No accessory muscle use. ABD: S, NT, ND, +BS. No rebound. No HSM. EXTR: No c/c/e PSYCH: Normally interactive. Conversant.   EKG: new ST elevation is present when compared with tracing from yesterday Ran this by her cardiologist Dr Lafayette Pierre who was also concerned   Results for orders placed or performed in visit on 07/20/23  POCT urinalysis dipstick   Collection Time: 07/20/23  3:33 PM  Result Value Ref Range   Color, UA yellow yellow   Clarity, UA hazy (A) clear   Glucose, UA negative negative mg/dL   Bilirubin, UA negative negative   Ketones, POC UA small (15) (A) negative mg/dL   Spec Grav, UA 2.952 8.413 - 1.025   Blood, UA negative negative   pH, UA 6.0 5.0 - 8.0   Protein Ur, POC negative negative mg/dL   Urobilinogen, UA 0.2 0.2 or 1.0 E.U./dL   Nitrite, UA Negative Negative   Leukocytes, UA Small (1+) (A) Negative  POC COVID-19 BinaxNow   Collection Time: 07/20/23  3:40 PM  Result Value Ref Range   SARS Coronavirus 2 Ag Negative Negative    Assessment and Plan: Malaise - Plan: POC COVID-19 BinaxNow, POCT urinalysis dipstick, Urine Culture, DG Chest 2 View,  CBC, Comprehensive metabolic panel with GFR  SOB (shortness of breath) - Plan: EKG 12-Lead Pt seen today with malaise and SOB She just saw cardiology yesterday but has new EKG changes Referral to ER now  for further evaluation   Signed Gates Kasal, MD

## 2023-07-20 NOTE — H&P (Signed)
 Cardiology Admission History and Physical   Patient ID: Faith Massey MRN: [id number]; DOB: 1945-10-07   Admission date: 07/20/2023  PCP:  Kaylee Partridge, MD   [facility] Providers Cardiologist:  Revankar  Chief Complaint:  Chest pain  History of Present Illness:   Faith Massey is a 78 yo female with history of breast cancer, prior CVA, PE on Eliquis , HTN, HLD who presented to primary care today with c/o dyspnea and fever. She was sent to the [facility] ED after her EKG was abnormal. Pt notes burning in her chest last night but no chest pain today. She has mild dyspnea today. Code STEMI activated by ED provider. Pt arrived at [facility] with no complaints. She denies any chest pain today.    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 Retta Caster   Hoarseness of voice 05/09/2023   HTN (hypertension) 03/28/2022   Hyperlipidemia    Hypertension    Laceration of muscle, fascia and tendon of long head of biceps, unspecified arm, initial encounter    right arm  involving rotator  cuff   Myasthenia gravis (HCC)    Dx'd 03/2022   Myasthenic syndrome (HCC) 04/12/2022   Osteoporosis 10/12/2012   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 10/16/2012   Pneumonia    Pre-diabetes    Prediabetes 05/25/2022   Pyelonephritis 11/10/2022   Right knee injury 02/14/2017   S/P radiation therapy 05/09/2023   S/P Robotic Assisted Right Video Thoracoscopy with resection of Thymus 04/25/2022   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 Authorizing Provider  acetaminophen  (TYLENOL ) 650 MG CR tablet Take 1,300 mg by mouth every 8 (eight) hours as needed for pain.    [provider]  apixaban  (ELIQUIS ) 2.5 MG TABS tablet Take 1 tablet (2.5 mg total) by mouth 2 (two) times daily. 02/11/23   Joyce Nixon, MD  aspirin  EC 81 MG tablet Take 1 tablet (81 mg total) by mouth daily. Swallow whole. 03/30/22   Krishnan, Gokul, MD  BLACK ELDERBERRY PO Take 1 capsule by mouth daily as needed (immune support).    [provider]  Cholecalciferol (VITAMIN D-3) 5000 UNITS TABS Take 5,000 Units by mouth daily.    [provider]  clotrimazole  (MYCELEX ) 10 MG troche Take 1 tablet (10 mg total) by mouth 5 (five) times daily. 11/17/22   Copland, Skipper Dumas, MD  COLLAGEN PO Take 1 Scoop by mouth daily.    [provider]  ibuprofen  (ADVIL ) 400 MG tablet Take 1 tablet (400 mg total) by mouth every  6 (six) hours as needed for mild pain (pain score 1-3). 02/10/23   Thomas, Alicia, MD  lisinopril  (ZESTRIL ) 40 MG tablet Take 1 tablet (40 mg total) by mouth daily. 04/17/23   Copland, Skipper Dumas, MD  Melatonin 10 MG CAPS Take 10 mg by mouth at bedtime.    [provider]  Multiple Vitamins-Minerals (MULTIVITAMIN WITH MINERALS) tablet Take 1 tablet by mouth daily.    [provider]  nystatin  (MYCOSTATIN ) 100000 UNIT/ML suspension Take 5 mLs (500,000 Units total) by mouth as needed (thrush). Swish, hold in mouth and spit up to 4x a day as needed 02/27/23   Copland, Skipper Dumas, MD  OCTAGAM 20 GM/200ML SOLN  Inject 50 g into the vein. 03/29/2023 Takes 50 grams for 2 days every 3 weeks. 12/13/22   [provider]  Probiotic Product (PROBIOTIC PO) Take 1-2 capsules by mouth daily.    [provider]  pyridostigmine  (MESTINON ) 60 MG tablet Take 1 tablet (60 mg total) by mouth 4 (four) times daily as needed. 03/08/23   Phebe Brasil, MD  traMADol  (ULTRAM ) 50 MG tablet Take 1 tablet (50 mg total) by mouth every 6 (six) hours as needed. 02/10/23   Joyce Nixon, MD     Allergies:    Allergies  Allergen Reactions   Prednisone  Shortness Of Breath, Swelling, Palpitations, Dermatitis and Hypertension     Reports medication caused swelling and metallic taste in mouth.    Social History:   Social History   Socioeconomic History   Marital status: Divorced    Spouse name: Not on file   Number of children: Not on file   Years of education: Not on file   Highest education level: Bachelor's degree (e.g., BA, AB, BS)  Occupational History   Not on file  Tobacco Use   Smoking status: Never    Passive exposure: Never   Smokeless tobacco: Never  Vaping Use   Vaping status: Never Used  Substance and Sexual Activity   Alcohol use: Not Currently    Alcohol/week: 1.0 standard drink of alcohol    Types: 1 Glasses of wine per week    Comment: occasional   very rare   Drug use: No   Sexual activity: Not Currently  Other Topics Concern   Not on file  Social History Narrative   Lives alone   Right handed   Caffeine- 2 cups of coffee   Social Drivers of Health   Financial Resource Strain: Medium Risk (07/11/2023)   Overall Financial Resource Strain (CARDIA)    Difficulty of Paying Living Expenses: Somewhat hard  Food Insecurity: No Food Insecurity (07/11/2023)   Hunger Vital Sign    Worried About Running Out of Food in the Last Year: Never true    Ran Out of Food in the Last Year: Never true  Transportation Needs: No Transportation Needs (07/11/2023)   PRAPARE - Doctor, general practice (Medical): No    Lack of Transportation (Non-Medical): No  Physical Activity: Insufficiently Active (07/11/2023)   Exercise Vital Sign    Days of Exercise per Week: 7 days    Minutes of Exercise per Session: 10 min  Stress: Stress Concern Present (07/11/2023)   [employer] of Occupational Health - Occupational Stress Questionnaire    Feeling of Stress : To some extent  Social Connections: Moderately Integrated (07/11/2023)   Social Connection and Isolation Panel [NHANES]    Frequency of Communication with Friends and Family: More than three times a  week    Frequency of Social Gatherings with Friends and Family: Twice a week    Attends Religious Services: More than 4 times per year    Active Member of [employer] or Organizations: Yes    Attends Engineer, structural: More than 4 times per year    Marital Status: Divorced  Intimate Partner Violence: Not At Risk (07/11/2023)   Humiliation, Afraid, Rape, and Kick questionnaire    Fear of Current or Ex-Partner: No    Emotionally Abused: No    Physically Abused: No    Sexually Abused: No    Family History:   The patient's family history includes Hypertension in her father and mother; Myasthenia gravis in her mother; Stroke in her mother. There is no history of Colon cancer or Esophageal cancer.    ROS:  Please see the history of present illness.  All other ROS reviewed and negative.     Physical Exam/Data:   Vitals:   07/20/23 1612 07/20/23 1620  BP: (!) 126/54   Pulse: 90   Resp: 18   Temp: 98.7 F (37.1 C)   TempSrc: Oral   SpO2: 100%   Weight:  51.1 kg   No intake or output data in the 24 hours ending 07/20/23 1732    07/20/2023    4:20 PM 07/20/2023    3:09 PM 07/19/2023   11:13 AM  Last 3 Weights  Weight (lbs) 112 lb 9.6 oz 111 lb 9.6 oz 112 lb  Weight (kg) 51.075 kg 50.621 kg 50.803 kg     Body mass index is 22.74 kg/m.  General:  Well nourished, well developed, in no acute distress HEENT:  normal Neck: no JVD Vascular: No carotid bruits; Distal pulses 2+ bilaterally   Cardiac:  normal S1, S2; RRR; no murmur  Lungs:  clear to auscultation bilaterally, no wheezing, rhonchi or rales  Abd: soft, nontender, no hepatomegaly  Ext: no LE edema Musculoskeletal:  No deformities, BUE and BLE strength normal and equal Skin: warm and dry  Neuro:  CNs 2-12 intact, no focal abnormalities noted Psych:  Normal affect    EKG:  The ECG that was done was personally reviewed and demonstrates sinus, diffuse ST elevation with PR depression  Relevant CV Studies:   Laboratory Data:  High Sensitivity Troponin:  No results for input(s): "TROPONINIHS" in the last 720 hours.    Chemistry Recent Labs  Lab 07/20/23 1621  NA 137  K 4.1  CL 102  CO2 19*  GLUCOSE 108*  BUN 26*  CREATININE 0.80  CALCIUM  9.6  GFRNONAA >60  ANIONGAP 16*    No results for input(s): "PROT", "ALBUMIN ", "AST", "ALT", "ALKPHOS", "BILITOT" in the last 168 hours. Lipids No results for input(s): "CHOL", "TRIG", "HDL", "LABVLDL", "LDLCALC", "CHOLHDL" in the last 168 hours. Hematology Recent Labs  Lab 07/20/23 1621  WBC 10.6*  RBC 3.53*  HGB 11.2*  HCT 33.8*  MCV 95.8  MCH 31.7  MCHC 33.1  RDW 13.5  PLT 269   Thyroid  No results for input(s): "TSH", "FREET4" in the last 168 hours. BNP Recent Labs  Lab 07/20/23 1640  PROBNP 727.0*    DDimer No results for input(s): "DDIMER" in the last 168 hours.   Radiology/Studies:  No results found.   Assessment and Plan:   Chest pain/Abnormal EKG: Will plan emergent cardiac cath.    Code Status: Full Code  Severity of Illness: The appropriate patient status for this patient is INPATIENT. Inpatient status is judged to be  reasonable and necessary in order to provide the required intensity of service to ensure the patient's safety. The patient's presenting symptoms, physical exam findings, and initial radiographic and laboratory data in the context of their  chronic comorbidities is felt to place them at high risk for further clinical deterioration. Furthermore, it is not anticipated that the patient will be medically stable for discharge from the hospital within 2 midnights of admission.   * I certify that at the point of admission it is my clinical judgment that the patient will require inpatient hospital care spanning beyond 2 midnights from the point of admission due to high intensity of service, high risk for further deterioration and high frequency of surveillance required.*   For questions or updates, please contact [facility] Please consult [email or web address] for contact info under     Signed, Antoinette Batman, MD  07/20/2023 5:32 PM

## 2023-07-20 NOTE — ED Notes (Signed)
 Last night Pt. Said she felt like she had reflux and nothing helped but she was unable to get her breath.

## 2023-07-20 NOTE — ED Triage Notes (Signed)
 Pt seen by Dr Allison Arena and sent to ED for evaluation due to concerns of EKG. Pt had pre op EKG that was normal yesterday . SOB began last night and heart burn. Denies pain, but feels fatigued

## 2023-07-20 NOTE — ED Notes (Signed)
[facility] enroute to [facility]

## 2023-07-21 ENCOUNTER — Inpatient Hospital Stay (HOSPITAL_BASED_OUTPATIENT_CLINIC_OR_DEPARTMENT_OTHER)

## 2023-07-21 ENCOUNTER — Encounter (HOSPITAL_COMMUNITY): Payer: Self-pay | Admitting: Cardiovascular Disease

## 2023-07-21 DIAGNOSIS — I2510 Atherosclerotic heart disease of native coronary artery without angina pectoris: Secondary | ICD-10-CM

## 2023-07-21 DIAGNOSIS — E782 Mixed hyperlipidemia: Secondary | ICD-10-CM | POA: Diagnosis not present

## 2023-07-21 DIAGNOSIS — Z79899 Other long term (current) drug therapy: Secondary | ICD-10-CM | POA: Diagnosis not present

## 2023-07-21 DIAGNOSIS — Z86711 Personal history of pulmonary embolism: Secondary | ICD-10-CM | POA: Diagnosis not present

## 2023-07-21 DIAGNOSIS — Z7982 Long term (current) use of aspirin: Secondary | ICD-10-CM | POA: Diagnosis not present

## 2023-07-21 DIAGNOSIS — G7001 Myasthenia gravis with (acute) exacerbation: Secondary | ICD-10-CM | POA: Diagnosis not present

## 2023-07-21 DIAGNOSIS — R7303 Prediabetes: Secondary | ICD-10-CM | POA: Diagnosis not present

## 2023-07-21 DIAGNOSIS — R9431 Abnormal electrocardiogram [ECG] [EKG]: Secondary | ICD-10-CM

## 2023-07-21 DIAGNOSIS — I10 Essential (primary) hypertension: Secondary | ICD-10-CM

## 2023-07-21 DIAGNOSIS — Z853 Personal history of malignant neoplasm of breast: Secondary | ICD-10-CM | POA: Diagnosis not present

## 2023-07-21 DIAGNOSIS — G7000 Myasthenia gravis without (acute) exacerbation: Secondary | ICD-10-CM | POA: Diagnosis not present

## 2023-07-21 DIAGNOSIS — G709 Myoneural disorder, unspecified: Secondary | ICD-10-CM | POA: Diagnosis not present

## 2023-07-21 DIAGNOSIS — Z8673 Personal history of transient ischemic attack (TIA), and cerebral infarction without residual deficits: Secondary | ICD-10-CM | POA: Diagnosis not present

## 2023-07-21 DIAGNOSIS — E785 Hyperlipidemia, unspecified: Secondary | ICD-10-CM | POA: Diagnosis not present

## 2023-07-21 DIAGNOSIS — Z7901 Long term (current) use of anticoagulants: Secondary | ICD-10-CM | POA: Diagnosis not present

## 2023-07-21 LAB — ECHOCARDIOGRAM COMPLETE
AR max vel: 1.53 cm2
AV Area VTI: 1.62 cm2
AV Area mean vel: 1.46 cm2
AV Mean grad: 4 mmHg
AV Peak grad: 8.2 mmHg
Ao pk vel: 1.43 m/s
Area-P 1/2: 3.19 cm2
Calc EF: 70.1 %
Height: 59 in
S' Lateral: 2.3 cm
Single Plane A2C EF: 75.6 %
Single Plane A4C EF: 65.4 %
Weight: 1834.23 [oz_av]

## 2023-07-21 LAB — LIPID PANEL
Cholesterol: 203 mg/dL — ABNORMAL HIGH (ref 0–200)
HDL: 72 mg/dL (ref >40)
LDL Cholesterol: 118 mg/dL — ABNORMAL HIGH (ref 0–99)
Total CHOL/HDL Ratio: 2.8 ratio
Triglycerides: 64 mg/dL (ref <150)
VLDL: 13 mg/dL (ref 0–40)

## 2023-07-21 LAB — BASIC METABOLIC PANEL WITH GFR
Anion gap: 10 (ref 5–15)
BUN: 21 mg/dL (ref 8–23)
CO2: 19 mmol/L — ABNORMAL LOW (ref 22–32)
Calcium: 8.6 mg/dL — ABNORMAL LOW (ref 8.9–10.3)
Chloride: 110 mmol/L (ref 98–111)
Creatinine, Ser: 0.78 mg/dL (ref 0.44–1.00)
GFR, Estimated: >60 mL/min (ref >60)
Glucose, Bld: 103 mg/dL — ABNORMAL HIGH (ref 70–99)
Potassium: 3.9 mmol/L (ref 3.5–5.1)
Sodium: 139 mmol/L (ref 135–145)

## 2023-07-21 LAB — URINE CULTURE
MICRO NUMBER:: 16400914
Result:: NO GROWTH
SPECIMEN QUALITY:: ADEQUATE

## 2023-07-21 LAB — C-REACTIVE PROTEIN: CRP: 11.7 mg/dL — ABNORMAL HIGH (ref <1.0)

## 2023-07-21 LAB — HEMOGLOBIN A1C
Hgb A1c MFr Bld: 5.4 % (ref 4.8–5.6)
Mean Plasma Glucose: 108.28 mg/dL

## 2023-07-21 LAB — MRSA NEXT GEN BY PCR, NASAL: MRSA by PCR Next Gen: NOT DETECTED

## 2023-07-21 LAB — SEDIMENTATION RATE: Sed Rate: 31 mm/h — ABNORMAL HIGH (ref 0–22)

## 2023-07-21 LAB — D-DIMER, QUANTITATIVE: D-Dimer, Quant: 0.73 ug{FEU}/mL — ABNORMAL HIGH (ref 0.00–0.50)

## 2023-07-21 LAB — GLUCOSE, CAPILLARY: Glucose-Capillary: 93 mg/dL (ref 70–99)

## 2023-07-21 MED ORDER — LISINOPRIL 10 MG PO TABS: 10.0000 mg | ORAL_TABLET | Freq: Every day | ORAL | Status: DC

## 2023-07-21 MED ORDER — PERFLUTREN LIPID MICROSPHERE
1.0000 mL | INTRAVENOUS | Status: AC | PRN
  Administered 2023-07-21: 2 mL via INTRAVENOUS

## 2023-07-21 MED ORDER — CHLORHEXIDINE GLUCONATE CLOTH 2 % EX PADS
6.0000 | MEDICATED_PAD | Freq: Every day | CUTANEOUS | Status: DC
  Administered 2023-07-21: 6 via TOPICAL

## 2023-07-21 MED ORDER — LISINOPRIL 20 MG PO TABS: 20.0000 mg | ORAL_TABLET | Freq: Every day | ORAL | 2 refills | Status: DC

## 2023-07-21 NOTE — TOC CM/SW Note (Signed)
 Transition of Care [facility]) - Inpatient Brief Assessment   Patient Details  Name: Faith Massey MRN: [id number] Date of Birth: December 31, 1945  Transition of Care [facility] CM/SW Contact:    Cosimo Diones, RN Phone Number: 07/21/2023, 1:36 PM   Clinical Narrative: Patient presented for chest pain-post LHC. PTA patient was independent from home. Patient reports that she has a PCP and she drives to appointments. Plan will be to return home once stable.   Transition of Care Asessment: Insurance and Status: Insurance coverage has been reviewed Patient has primary care physician: Yes Home environment has been reviewed: reviewed Prior level of function:: independent Prior/Current Home Services: No current home services Social Drivers of Health Review: SDOH reviewed no interventions necessary Readmission risk has been reviewed: Yes Transition of care needs: no transition of care needs at this time

## 2023-07-21 NOTE — Progress Notes (Signed)
*  PRELIMINARY RESULTS* Echocardiogram 2D Echocardiogram has been performed.  Faith Massey 07/21/2023, 9:51 AM

## 2023-07-21 NOTE — Care Management CC44 (Signed)
 Condition Code 44 Documentation Completed  Patient Details  Name: Nikcole Graf MRN: [id number] Date of Birth: [birth year]   Condition Code 44 given:  Yes Patient signature on Condition Code 44 notice:  Yes Documentation of 2 MD's agreement:  Yes Code 44 added to claim:  Yes    Cosimo Diones, RN 07/21/2023, 4:35 PM

## 2023-07-21 NOTE — Discharge Instructions (Addendum)
 Medication Changes: - STOP Aspirin . - DECREASE Lisinopril  to 20mg  daily starting tomorrow 07/22/2023. - Please continue all other medications as listed elsewhere on discharge summary. A few other medications were stopped because you said you were no longer taking them on admission.  **IT IS OKAY FOR YOU TO RECEIVE YOUR IVIG TODAY.** _______________  Post-Cardiac Catheterization: NO HEAVY LIFTING OR SEXUAL ACTIVITY X 7 DAYS. NO DRIVING X 2-3 DAYS. NO SOAKING BATHS, HOT TUBS, POOLS, ETC., X 7 DAYS.  Groin Site Care: Refer to this sheet in the next few weeks. These instructions provide you with information on caring for yourself after your procedure. Your caregiver may also give you more specific instructions. Your treatment has been planned according to current medical practices, but problems sometimes occur. Call your caregiver if you have any problems or questions after your procedure. HOME CARE INSTRUCTIONS You may shower 24 hours after the procedure. Remove the bandage (dressing) and gently wash the site with plain soap and water . Gently pat the site dry.  Do not apply powder or lotion to the site.  Do not sit in a bathtub, swimming pool, or whirlpool for 5 to 7 days.  No bending, squatting, or lifting anything over 10 pounds (4.5 kg) as directed by your caregiver.  Inspect the site at least twice daily.  Do not drive home if you are discharged the same day of the procedure. Have someone else drive you.  What to expect: Any bruising will usually fade within 1 to 2 weeks.  Blood that collects in the tissue (hematoma) may be painful to the touch. It should usually decrease in size and tenderness within 1 to 2 weeks.  SEEK IMMEDIATE MEDICAL CARE IF: You have unusual pain at the groin site or down the affected leg.  You have redness, warmth, swelling, or pain at the groin site.  You have drainage (other than a small amount of blood on the dressing).  You have chills.  You have a fever or  persistent symptoms for more than 72 hours.  You have a fever and your symptoms suddenly get worse.  Your leg becomes pale, cool, tingly, or numb.  You have heavy bleeding from the site. Hold pressure on the site.

## 2023-07-21 NOTE — Care Management Obs Status (Signed)
 MEDICARE OBSERVATION STATUS NOTIFICATION   Patient Details  Name: Faith Massey MRN: [id number] Date of Birth: [birth year]   Medicare Observation Status Notification Given:  Yes    Cosimo Diones, RN 07/21/2023, 4:35 PM

## 2023-07-21 NOTE — Discharge Summary (Addendum)
 Discharge Summary    Patient ID: Faith Massey MRN: [id number]; DOB: 12-Jul-1945  Admit date: 07/20/2023 Discharge date: 07/21/2023  PCP:  Kaylee Partridge, MD   [facility] Providers Cardiologist:  Antoinette Batman, MD   {  Discharge Diagnoses    Principal Problem:   Abnormal EKG Active Problems:   Chest pain   HTN (hypertension)   Prediabetes   History of pulmonary embolism   Hyperlipidemia   Myasthenia gravis [facility])    Diagnostic Studies/Procedures    Left Cardiac Catheterization 07/20/2023: No angiographic evidence of CAD   Recommendations: Monitor overnight. EKG changes likely due to pericarditis. Echo tomorrow.   Diagnostic Dominance: Right    _____________   Echocardiogram 07/21/2023: LVEF 65 to 70%; no RWMA.  Mild basal septal hypertrophy with GR 1 DD.  Normal RV.  Normal mitral valve.  Mild AV sclerosis/calcification with no stenosis.  Normal RAP.    History of Present Illness     Faith Massey is a 78 y.o. female with a history of prior CVA, PE on Eliquis , hypertension, hyperlipidemia, prediabetes, myasthenia gravis, thymus neoplasm, and breast cancer who was admitted on 07/20/2023 for chest pain. Patient presented to her PCP on 07/20/2023 with reports of dyspnea and fever. EKG was abnormal in the office, so she was sent to the [facility] ED for further evaluation. In the ED, she reported burning in her chest the night before but no chest pain/ discomfort on day of presentation. However, she did report some mild dyspnea on day of presentation. EKG showed normal sinus rhythm with ST elevations in inferolateral leads. CODE STEMI was called and she was transferred to [facility] for cardiac catheterization. She denied any chest pain upon arrival to the [facility].  Hospital Course     Consultants: None   Abnormal EKG Chest Pain Patient presented to the [facility] ED on 07/20/2023 from her PCP's office for further evaluation of abnormal EKG.  EKG upon arrival to the ED showed ST elevations in inferolateral leads. High-sensitivity troponin was minimally elevated at 35. She reported one episode of chest burning the night before and some mild dyspnea on the day of presentation. However, no chest pain/ discomfort on day of presentation. Code STEMI was called and patient was transferred to [facility] for emergent cardiac catheterization. LHC showed clean coronaries. Echo showed LVEF of 65-70% with normal wall motion, mild asymmetric LVH of the basal-septal segment, and grade 1 diastolic dysfunction. D-dimer negative when adjusted for age. CRP and Sed Rate were elevated at 11.7 and 31 respectively. She denies any symptoms suggestive of pericarditis so will not start treatment for this. OK to Aspirin  given clean coronaries and need for Eliquis  with history of PE.  Hypertension History of hypertension listed in her chart but BP soft this admission with systolic BP mostly in the 90s to low 100s. She is on Lisinopril  40mg  daily at home but has not received any of this here. Will descrease Lisinopril  to 20mg  daily at discharge and have her start this on 07/22/2023.   Hyperlipidemia Lipid panel this admission: Total Cholesterol 203, Triglycerides 64, HDL 72, LDL 118. Discussed with Dr. Addie Holstein, given clean coronaries and history of myasthenia gravis, will not start statin.   Prediabetes Hemoglobin A1c 5.4% this admission. Follow-up with PCP.  Myasthenia Gravis  Continue home Pyridostigmine  60mg  four times daily as needed. Also treated with IVIG as an outpatient. She was scheduled to have her usual treatment of IVIG yesterday and today  and has missed this. She states she was told she could still receive today's dose at home if she is able to be discharged in time. OK for her to resume IVIG treatment.   History of PE Continue home Eliquis  2.5mg  twice daily.  Patient seen and examined by Dr. Lavonne Prairie today and determined ot be stable for discharge.  Outpatient follow-up has been arranged. Medications as below.     Did the patient have an acute coronary syndrome (MI, NSTEMI, STEMI, etc) this admission?:  No                               Did the patient have a percutaneous coronary intervention (stent / angioplasty)?:  No.     Discharge Vitals Blood pressure 119/63, pulse 89, temperature 97.8 F (36.6 C), temperature source Axillary, resp. rate (!) 22, height 4\' 11"  (1.499 m), weight 52 kg, SpO2 96%.  Filed Weights   07/20/23 1620 07/20/23 2346  Weight: 51.1 kg 52 kg   Physical Exam per MD:  General: 78 y.o. female resting comfortably in no acute distress.  + Very elevated for stated age. HEENT: Normocephalic and atraumatic. Sclera clear.  Neck: Supple. No JVD. Heart: RRR. Distinct S1 and S2. No murmurs, gallops, or rubs.  Lungs: No increased work of breathing. Clear to ausculation bilaterally. No wheezes, rhonchi, or rales.  Abdomen: Soft, non-distended, and non-tender to palpation.  Extremities: No lower extremity edema bilaterally. Right femoral cath site soft with no signs of hematoma.    Skin: Warm and dry. Neuro: Alert and oriented x3. No focal deficits. Psych: Normal affect. Responds appropriately.   Labs & Radiologic Studies    CBC Recent Labs    07/20/23 1621  WBC 10.6*  NEUTROABS 7.8*  HGB 11.2*  HCT 33.8*  MCV 95.8  PLT 269   Basic Metabolic Panel Recent Labs    16/10/96 1621 07/21/23 0304  NA 137 139  K 4.1 3.9  CL 102 110  CO2 19* 19*  GLUCOSE 108* 103*  BUN 26* 21  CREATININE 0.80 0.78  CALCIUM  9.6 8.6*   Liver Function Tests No results for input(s): "AST", "ALT", "ALKPHOS", "BILITOT", "PROT", "ALBUMIN " in the last 72 hours. No results for input(s): "LIPASE", "AMYLASE" in the last 72 hours. High Sensitivity Troponin:   No results for input(s): "TROPONINIHS" in the last 720 hours.  BNP Invalid input(s): "POCBNP" D-Dimer Recent Labs    07/21/23 1518  DDIMER 0.73*   Hemoglobin  A1C Recent Labs    07/20/23 1657  HGBA1C 5.4   Fasting Lipid Panel Recent Labs    07/20/23 1657  CHOL 203*  HDL 72  LDLCALC 118*  TRIG 64  CHOLHDL 2.8   Thyroid  Function Tests No results for input(s): "TSH", "T4TOTAL", "T3FREE", "THYROIDAB" in the last 72 hours.  Invalid input(s): "FREET3" _____________  EDG Chest Portable 1 View Result Date: 07/20/2023 CLINICAL DATA:  Abnormal EKG. Shortness of breath and heartburn beginning last night. EXAM: PORTABLE CHEST 1 VIEW COMPARISON:  CT chest 04/05/2023 FINDINGS: Heart size and pulmonary vascularity are normal. Lungs are clear. No pleural effusion or pneumothorax. Mediastinal contours appear intact. Degenerative changes in the spine and shoulders. IMPRESSION: No active disease. Electronically Signed   By: Boyce Byes M.D.   On: 07/20/2023 17:33   DG Hip Unilat W OR W/O Pelvis 2-3 Views Right Result Date: 07/05/2023 CLINICAL DATA:  Status post fall onto right hip, pain. Patient reports fall  at the end of February. EXAM: DG HIP (WITH OR WITHOUT PELVIS) 2-3V RIGHT COMPARISON:  None Available. FINDINGS: The bones are subjectively under mineralized. No acute or evidence of healing/healed fracture. No hip dislocation. Pubic rami are intact. Both hip joint spaces are preserved. Pubic symphysis and sacroiliac joints are congruent. No erosions or evidence of avascular necrosis. Unremarkable soft tissues IMPRESSION: No evidence of fracture or explanation for pain. Electronically Signed   By: Chadwick Colonel M.D.   On: 07/05/2023 18:45   Disposition   Patient is being discharged home today in good condition.  Follow-up Plans & Appointments     Follow-up Information     Carie Charity, NP Follow up.   Specialty: Cardiology Why: Hospital follow-up with Cardiology scheduled for 08/03/2023 at 8:25am. Please arrive 15 minutes early for check-in. If this date/ time does not work for you, please call our office to reschedule. Contact  information: [address] [place] [address] [phone]         Kaylee Partridge, MD. Call.   Specialty: Family Medicine Why: Please call and schedule a hospital follow-up visit with your PCP within the next 1-2 weeks. Contact information: [address] [place] [address] [phone]                Discharge Instructions     Diet - low sodium heart healthy   Complete by: As directed    Increase activity slowly   Complete by: As directed         Discharge Medications   Allergies as of 07/21/2023       Reactions   Prednisone  Shortness Of Breath, Swelling, Palpitations, Dermatitis, Hypertension    Reports medication caused swelling and metallic taste in mouth.        Medication List     STOP taking these medications    aspirin  EC 325 MG tablet   aspirin  EC 81 MG tablet   clotrimazole  10 MG troche Commonly known as: MYCELEX    ibuprofen  400 MG tablet Commonly known as: ADVIL    nystatin  100000 UNIT/ML suspension Commonly known as: MYCOSTATIN        TAKE these medications    acetaminophen  650 MG CR tablet Commonly known as: TYLENOL  Take 1,300 mg by mouth every 8 (eight) hours as needed for pain.   apixaban  2.5 MG Tabs tablet Commonly known as: Eliquis  Take 1 tablet (2.5 mg total) by mouth 2 (two) times daily.   BLACK ELDERBERRY PO Take 1 capsule by mouth daily as needed (immune support).   COLLAGEN PO Take 1 Scoop by mouth daily.   lisinopril  20 MG tablet Commonly known as: ZESTRIL  Take 1 tablet (20 mg total) by mouth daily. Start taking on: Jul 22, 2023 What changed:  medication strength how much to take   Melatonin 10 MG Caps Take 10 mg by mouth at bedtime.   multivitamin with minerals tablet Take 1 tablet by mouth daily.   Octagam 20 GM/200ML Soln Generic drug: Immune Globulin 10% Inject 50 g into the vein. 03/29/2023 Takes 50 grams for 2 days every 3 weeks.   PROBIOTIC PO Take 1-2  capsules by mouth daily.   pyridostigmine  60 MG tablet Commonly known as: Mestinon  Take 1 tablet (60 mg total) by mouth 4 (four) times daily as needed.   traMADol  50 MG tablet Commonly known as: ULTRAM  Take 1 tablet (50 mg total) by mouth every 6 (six) hours as needed.   Vitamin D-3 125 MCG (5000  UT) Tabs Take 5,000 Units by mouth daily.           Outstanding Labs/Studies    Duration of Discharge Encounter: APP Time: 20 minutes   Signed, Callie E Goodrich, PA-C 07/21/2023, 4:32 PM   ATTENDING ATTESTATION  I have seen, examined and evaluated the patient this morning on rounds, and discussed the case with Callie Goodrich \, PA who served as a Neurosurgeon..  After reviewing all the available data and chart, we discussed the patients laboratory, study & physical findings as well as symptoms in detail.  We also discussed general summary of the patient's hospitalization along with pertinent findings, impressions and recommendations.  I agree with the findings, my examination, impression recommendations and hospital summary as noted above.  Admitted for sudden onset left-sided chest discomfort that was a fleeting episode occurring the evening prior to her admission.  She went to see her PCP and with concerning EKG findings was referred to [facility].  She does have 1 EKG that has slightly more prominent J-point elevation then the remainder of her studies.  Thankfully, she was taken for urgent catheterization just to exclude CAD and not have widely patent coronaries making it unlikely for this to be ACS.  There are some consideration about it being potentially pericarditis, however she is not having any additional symptoms and is not positional.  Would be reluctant to treat with colchicine for fear of medication reaction.       Arleen Lacer, MD, MS Randene Bustard, M.D., M.S. Interventional Chartered certified accountant  Pager # [phone]

## 2023-07-23 ENCOUNTER — Encounter: Payer: Self-pay | Admitting: Family Medicine

## 2023-07-26 ENCOUNTER — Telehealth: Payer: Self-pay | Admitting: Neurology

## 2023-07-26 NOTE — Telephone Encounter (Signed)
 Jacob @ [facility] has called to provide update

## 2023-07-26 NOTE — Telephone Encounter (Signed)
 Faith Massey from [facility] called and stated that pt has been in hospital and hasn't gotten the octagam as scheduled but they are going to get them back on schedule now that they are out of hospital

## 2023-07-27 DIAGNOSIS — G709 Myoneural disorder, unspecified: Secondary | ICD-10-CM | POA: Diagnosis not present

## 2023-07-27 DIAGNOSIS — G7001 Myasthenia gravis with (acute) exacerbation: Secondary | ICD-10-CM | POA: Diagnosis not present

## 2023-07-28 ENCOUNTER — Ambulatory Visit: Attending: Cardiology | Admitting: Cardiology

## 2023-07-28 ENCOUNTER — Encounter: Payer: Self-pay | Admitting: Cardiology

## 2023-07-28 VITALS — BP 132/74 | HR 69 | Ht 59.0 in | Wt 112.0 lb

## 2023-07-28 DIAGNOSIS — R079 Chest pain, unspecified: Secondary | ICD-10-CM | POA: Diagnosis not present

## 2023-07-28 DIAGNOSIS — Z01810 Encounter for preprocedural cardiovascular examination: Secondary | ICD-10-CM | POA: Diagnosis not present

## 2023-07-28 DIAGNOSIS — R7303 Prediabetes: Secondary | ICD-10-CM | POA: Diagnosis not present

## 2023-07-28 DIAGNOSIS — I10 Essential (primary) hypertension: Secondary | ICD-10-CM | POA: Diagnosis not present

## 2023-07-28 DIAGNOSIS — G7000 Myasthenia gravis without (acute) exacerbation: Secondary | ICD-10-CM | POA: Diagnosis not present

## 2023-07-28 NOTE — Progress Notes (Signed)
 Cardiology Office Note    Date:  07/28/2023  ID:  Katessa Vatalaro, DOB 03/30/1945, MRN [id number] PCP:  Kaylee Partridge, MD  Cardiologist:  Antoinette Batman, MD  Electrophysiologist:  None   Chief Complaint: Follow up for chest pain   History of Present Illness: .    Faith Massey is a 78 y.o. female with visit-pertinent history of breast cancer, prior CVA, PE on Eliquis , hypertension, hyperlipidemia.  On 07/20/2023 patient presented to her primary care office with complaints of dyspnea and fever.  She was sent to [facility] ED after EKG was abnormal with ST elevations in inferior lateral leads, high since her troponin was minimally elevated at 35.  Patient reported a burning sensation in her chest the previous night denied chest pain on day of evaluation.  She did report some mild dyspnea.  A code STEMI was activated by the ED provider and transferred to [facility]  She was emergently taken to Cath Lab given diffuse ST elevation.  Cardiac catheterization showed no angiographic evidence of coronary artery disease, it was felt that EKG changes were likely due to pericarditis.  Echocardiogram on 07/21/2023 indicated LVEF of 65 to 70%.  LV with no RWMA, mild asymmetric left ventricular hypertrophy of the basal septal segment.  G1 DD.  RV systolic function and size was normal, there was trivial mitral valve regurgitation, aortic valve sclerosis without any evidence of stenosis.  Patient's D-dimer was negative when adjusted for age, CRP and sed rate were elevated at 11.7 and 31 respectively.  Patient denied any symptoms suggestive of pericarditis therefore treatment was not started.  Today she presents for follow up. She reports that she is doing very well overall.  Patient denies any further chest pain or increased shortness of breath.  She denies any increased lower extremity edema, orthopnea or PND.  She denies any palpitations, presyncope or syncope.  Patient reports that since she  was discharged she has not had any chest pain, patient reports that the only chest discomfort she had was the night prior to her hospital admission which she had a feeling of acid reflux.  She denies any chest discomfort with position changes.  Patient is eager to undergo her procedure as planned, she is able to achieve greater than 4 METS of activity.  ROS: .   Today she denies chest pain, shortness of breath, lower extremity edema, fatigue, palpitations, melena, hematuria, hemoptysis, diaphoresis, weakness, presyncope, syncope, orthopnea, and PND.  All other systems are reviewed and otherwise negative. Studies Reviewed: Aaron Aas    EKG:  EKG is ordered today, personally reviewed, demonstrating  EKG Interpretation Date/Time:  Friday Jul 28 2023 11:37:58 EDT Ventricular Rate:  66 PR Interval:  130 QRS Duration:  76 QT Interval:  396 QTC Calculation: 415 R Axis:   20  Text Interpretation: Normal sinus rhythm Nonspecific ST abnormality When compared with ECG of 21-Jul-2023 07:40, Vent. rate has decreased BY  35 BPM ST no longer elevated in Inferior leads Confirmed by Verlinda Slotnick [phone]) on 07/28/2023 11:40:18 AM   CV Studies: Cardiac studies reviewed are outlined and summarized above. Otherwise please see EMR for full report. Cardiac Studies & Procedures   ______________________________________________________________________________________________ CARDIAC CATHETERIZATION  CARDIAC CATHETERIZATION 07/20/2023  Conclusion No angiographic evidence of CAD  Recommendations: Monitor overnight. EKG changes likely due to pericarditis. Echo tomorrow.  Findings Coronary Findings Diagnostic  Dominance: Right  Left Anterior Descending Vessel is large.  Left Circumflex Vessel is large.  Right Coronary  Artery Vessel is large.  Intervention  No interventions have been documented.   STRESS TESTS  MYOCARDIAL PERFUSION IMAGING 01/03/2023  Narrative   The study is normal. The study is low  risk.   No ST deviation was noted.   LV perfusion is normal.   Left ventricular function is normal. End diastolic cavity size is normal. End systolic cavity size is normal.   Prior study not available for comparison.  Normal perfusion. LVEF 71% with normal wall motion. This is a low risk study. No prior for comparison.   ECHOCARDIOGRAM  ECHOCARDIOGRAM COMPLETE 07/21/2023  Narrative ECHOCARDIOGRAM REPORT    Patient Name:   Faith Massey Date of Exam: 07/21/2023 Medical Rec #:  [id number]  Height:       59.0 in Accession #:    [id number] Weight:       114.6 lb Date of Birth:  March 06, 1946   BSA:          1.456 m Patient Age:    77 years   BP:           110/53 mmHg Patient Gender: F          HR:           93 bpm. Exam Location:  Inpatient  Procedure: 2D Echo, Color Doppler and Cardiac Doppler (Both Spectral and Color Flow Doppler were utilized during procedure).  Indications:    CAD  History:        Patient has prior history of Echocardiogram examinations, most recent 03/29/2022.  Sonographer:    Andrena Bang Referring Phys: [id number] CHRISTOPHER D MCALHANY  IMPRESSIONS   1. Left ventricular ejection fraction, by estimation, is 65 to 70%. The left ventricle has normal function. The left ventricle has no regional wall motion abnormalities. There is mild asymmetric left ventricular hypertrophy of the basal-septal segment. Left ventricular diastolic parameters are consistent with Grade I diastolic dysfunction (impaired relaxation). 2. Right ventricular systolic function is normal. The right ventricular size is normal. 3. The mitral valve is normal in structure. Trivial mitral valve regurgitation. No evidence of mitral stenosis. 4. The aortic valve is tricuspid. There is mild calcification of the aortic valve. Aortic valve regurgitation is not visualized. Aortic valve sclerosis/calcification is present, without any evidence of aortic stenosis. 5. The inferior vena cava is normal in size with greater  than 50% respiratory variability, suggesting right atrial pressure of 3 mmHg.  FINDINGS Left Ventricle: Left ventricular ejection fraction, by estimation, is 65 to 70%. The left ventricle has normal function. The left ventricle has no regional wall motion abnormalities. The left ventricular internal cavity size was normal in size. There is mild asymmetric left ventricular hypertrophy of the basal-septal segment. Left ventricular diastolic parameters are consistent with Grade I diastolic dysfunction (impaired relaxation).  Right Ventricle: The right ventricular size is normal. No increase in right ventricular wall thickness. Right ventricular systolic function is normal.  Left Atrium: Left atrial size was normal in size.  Right Atrium: Right atrial size was normal in size.  Pericardium: There is no evidence of pericardial effusion.  Mitral Valve: The mitral valve is normal in structure. Trivial mitral valve regurgitation. No evidence of mitral valve stenosis.  Tricuspid Valve: The tricuspid valve is normal in structure. Tricuspid valve regurgitation is trivial. No evidence of tricuspid stenosis.  Aortic Valve: The aortic valve is tricuspid. There is mild calcification of the aortic valve. Aortic valve regurgitation is not visualized. Aortic valve sclerosis/calcification is present, without any evidence of aortic stenosis.  Aortic valve mean gradient measures 4.0 mmHg. Aortic valve peak gradient measures 8.2 mmHg. Aortic valve area, by VTI measures 1.62 cm.  Pulmonic Valve: The pulmonic valve was normal in structure. Pulmonic valve regurgitation is not visualized. No evidence of pulmonic stenosis.  Aorta: The aortic root is normal in size and structure.  Venous: The inferior vena cava is normal in size with greater than 50% respiratory variability, suggesting right atrial pressure of 3 mmHg.  IAS/Shunts: No atrial level shunt detected by color flow Doppler.   LEFT VENTRICLE PLAX  2D LVIDd:         3.80 cm     Diastology LVIDs:         2.30 cm     LV e' medial:    8.27 cm/s LV PW:         1.20 cm     LV E/e' medial:  6.1 LV IVS:        0.90 cm     LV e' lateral:   9.25 cm/s LVOT diam:     1.50 cm     LV E/e' lateral: 5.5 LV SV:         44 LV SV Index:   30 LVOT Area:     1.77 cm  LV Volumes (MOD) LV vol d, MOD A2C: 47.2 ml LV vol d, MOD A4C: 71.9 ml LV vol s, MOD A2C: 11.5 ml LV vol s, MOD A4C: 24.9 ml LV SV MOD A2C:     35.7 ml LV SV MOD A4C:     71.9 ml LV SV MOD BP:      42.4 ml  RIGHT VENTRICLE RV S prime:     11.00 cm/s TAPSE (M-mode): 1.9 cm  LEFT ATRIUM           Index LA diam:      3.80 cm 2.61 cm/m LA Vol (A2C): 24.3 ml 16.69 ml/m LA Vol (A4C): 24.3 ml 16.69 ml/m AORTIC VALVE AV Area (Vmax):    1.53 cm AV Area (Vmean):   1.46 cm AV Area (VTI):     1.62 cm AV Vmax:           143.00 cm/s AV Vmean:          93.500 cm/s AV VTI:            0.272 m AV Peak Grad:      8.2 mmHg AV Mean Grad:      4.0 mmHg LVOT Vmax:         124.00 cm/s LVOT Vmean:        77.500 cm/s LVOT VTI:          0.249 m LVOT/AV VTI ratio: 0.92  AORTA Ao Asc diam: 3.10 cm  MITRAL VALVE MV Area (PHT): 3.19 cm    SHUNTS MV Decel Time: 238 msec    Systemic VTI:  0.25 m MV E velocity: 50.60 cm/s  Systemic Diam: 1.50 cm MV A velocity: 69.40 cm/s MV E/A ratio:  0.73  Jules Oar MD Electronically signed by Jules Oar MD Signature Date/Time: 07/21/2023/9:54:46 AM    Final    MONITORS  LONG TERM MONITOR (3-14 DAYS) 04/20/2022  Narrative Images from the original result were not included. Zio Patch Extended out patient EKG monitoring 14 days starting 03/30/2022: Predominant Rhythm : Normal sinus rhythm min HR: 52 bpm at 12:30 AM. Max HR 148 bpm at 2:20 PM Atrial arrhythmias:   Brief episodes of atrial tachycardia, longest 7 beats. Atrial fibrillation:  None Ventricular arrhythmias:  Rare PVCs. PVC Burden <0.1% Heart Block:    None Symptoms:     No symptoms reported.       ______________________________________________________________________________________________       Current Reported Medications:.    Current Meds  Medication Sig   acetaminophen  (TYLENOL ) 650 MG CR tablet Take 1,300 mg by mouth every 8 (eight) hours as needed for pain.   apixaban  (ELIQUIS ) 2.5 MG TABS tablet Take 1 tablet (2.5 mg total) by mouth 2 (two) times daily.   BLACK ELDERBERRY PO Take 1 capsule by mouth daily as needed (immune support).   Cholecalciferol (VITAMIN D-3) 5000 UNITS TABS Take 5,000 Units by mouth daily.   COLLAGEN PO Take 1 Scoop by mouth daily.   lisinopril  (ZESTRIL ) 20 MG tablet Take 1 tablet (20 mg total) by mouth daily.   Melatonin 10 MG CAPS Take 10 mg by mouth at bedtime.   Multiple Vitamins-Minerals (MULTIVITAMIN WITH MINERALS) tablet Take 1 tablet by mouth daily.   OCTAGAM 20 GM/200ML SOLN Inject 50 g into the vein. 03/29/2023 Takes 50 grams for 2 days every 3 weeks.   OCTAGAM 5 GM/50ML SOLN    Probiotic Product (PROBIOTIC PO) Take 1-2 capsules by mouth daily.   pyridostigmine  (MESTINON ) 60 MG tablet Take 1 tablet (60 mg total) by mouth 4 (four) times daily as needed.   traMADol  (ULTRAM ) 50 MG tablet Take 1 tablet (50 mg total) by mouth every 6 (six) hours as needed.   Physical Exam:    VS:  BP 132/74   Pulse 69   Ht 4\' 11"  (1.499 m)   Wt 112 lb (50.8 kg)   SpO2 99%   BMI 22.62 kg/m    Wt Readings from Last 3 Encounters:  07/28/23 112 lb (50.8 kg)  07/20/23 114 lb 10.2 oz (52 kg)  07/20/23 111 lb 9.6 oz (50.6 kg)    GEN: Well nourished, well developed in no acute distress NECK: No JVD; No carotid bruits CARDIAC: RRR, no murmurs, rubs, gallops, right femoral cath site clean and intact without evidence of hematoma RESPIRATORY:  Clear to auscultation without rales, wheezing or rhonchi  ABDOMEN: Soft, non-tender, non-distended EXTREMITIES:  No edema; No acute deformity     Asessement and Plan:.    Chest  pain/Pericarditis: Patient admitted on 07/20/2023 as a code STEMI.  Cardiac catheterization showed clean coronaries.  Echo showed LVEF of 65 to 70% with normal wall motion, mild asymmetric LVH with basal septal segment grade 1 diastolic dysfunction.  D-dimer was negative when adjusted for age.  CRP and sed rate were elevated 11.7 and 31 respectively.  Patient was not started on medications during admission as she was asymptomatic. Today she reports that she is doing well, she denies any chest pain or increased shortness of breath.  Patient denies any chest pain with position changes.  Patient is able to achieve greater than 4 METS of activity without any anginal symptoms.  Patient's EKG is improved indicating normal sinus rhythm with a nonspecific ST abnormality, ST elevation is no longer present.  It was questioned if patient had pericarditis while in hospital however given no recurrent chest discomfort or positional chest discomfort she was not started on colchicine.  As patient continues to not have any chest discomfort or any symptoms will not start on treatment at this time.  Encouraged patient to monitor and notify the office if present.  Right femoral cath site is clean and intact without evidence of hematoma.  Reviewed ED precautions.  Hypertension: Initial blood pressure today 142/74, on recheck was 132/74.  Patient notes that her PCP increased her lisinopril  back to 30 mg daily, she was previously on 40 mg daily.  Encouraged patient to continue monitoring her blood pressure at home, if consistently elevated greater than 130/80 she plans to increase back to 40 mg daily.  Continue lisinopril  30 mg daily.  Preoperative cardiac evaluation: Patient to undergo right shoulder arthroplasty with Dr. Ernestina Headland supple.  Ms. Presas's perioperative risk of a major cardiac event is 0.4% according to the Revised Cardiac Risk Index (RCRI).  Therefore, she is at low risk for perioperative complications.   Her functional  capacity is good at 5.07 METs according to the Duke Activity Status Index (DASI).  Patient with cardiac catheterization earlier this month that showed no evidence of CAD, echocardiogram also reassuring with no evidence of pericarditis.  Patient denies any further chest discomfort or shortness of breath. Recommendations:According to ACC/AHA guidelines, no further cardiovascular testing needed.  The patient may proceed to surgery at acceptable risk.  Antiplatelet and/or Anticoagulation Recommendations: Patient is on Eliquis  for history of PE, recommendations regarding holding of Eliquis  will need to come from prescribing office.  Hyperlipidemia: Lipid profile during admission indicated total cholesterol 203, triglycerides 64, HDL 72 and LDL 118.  Per Dr. Addie Holstein given clean coronaries and history of myasthenia gravis not started on statin.  Prediabetes: Hemoglobin A1c 5.4 during admission.  Monitor managed per PCP.  Myasthenia gravis: Patient on prior Distigmine 60 mg 4 times daily as needed.  She is also treated with IVIG as outpatient.  History of PE: On Eliquis  2.5 mg twice daily.  Patient denies any bleeding problems.   Disposition: F/u with Dr. Abel Hoe in 5-6 months or sooner if needed.   Signed, Sanaai Doane D Rien Marland, NP

## 2023-07-28 NOTE — Patient Instructions (Signed)
 Medication Instructions:  NO CHANGES *If you need a refill on your cardiac medications before your next appointment, please call your pharmacy*  Lab Work: NO LABS If you have labs (blood work) drawn today and your tests are completely normal, you will receive your results only by: MyChart Message (if you have MyChart) OR A paper copy in the mail If you have any lab test that is abnormal or we need to change your treatment, we will call you to review the results.  Testing/Procedures: NO TESTING  Follow-Up: At [facility], you and your health needs are our priority.  As part of our continuing mission to provide you with exceptional heart care, our providers are all part of one team.  This team includes your primary Cardiologist (physician) and Advanced Practice Providers or APPs (Physician Assistants and Nurse Practitioners) who all work together to provide you with the care you need, when you need it.  Your next appointment:   6 month(s)  Provider:   Antoinette Batman, MD

## 2023-08-03 ENCOUNTER — Ambulatory Visit: Admitting: General Practice

## 2023-08-08 ENCOUNTER — Inpatient Hospital Stay: Payer: Medicare HMO

## 2023-08-08 ENCOUNTER — Inpatient Hospital Stay: Payer: Medicare HMO | Admitting: Hematology & Oncology

## 2023-08-10 ENCOUNTER — Other Ambulatory Visit: Payer: Self-pay

## 2023-08-10 ENCOUNTER — Inpatient Hospital Stay: Attending: Hematology & Oncology

## 2023-08-10 ENCOUNTER — Inpatient Hospital Stay (HOSPITAL_BASED_OUTPATIENT_CLINIC_OR_DEPARTMENT_OTHER): Admitting: Hematology & Oncology

## 2023-08-10 ENCOUNTER — Encounter: Payer: Self-pay | Admitting: Hematology & Oncology

## 2023-08-10 VITALS — BP 133/60 | HR 98 | Temp 97.5°F | Resp 18 | Ht 59.0 in | Wt 110.0 lb

## 2023-08-10 DIAGNOSIS — G709 Myoneural disorder, unspecified: Secondary | ICD-10-CM

## 2023-08-10 DIAGNOSIS — C37 Malignant neoplasm of thymus: Secondary | ICD-10-CM

## 2023-08-10 DIAGNOSIS — G7001 Myasthenia gravis with (acute) exacerbation: Secondary | ICD-10-CM | POA: Diagnosis not present

## 2023-08-10 DIAGNOSIS — C50312 Malignant neoplasm of lower-inner quadrant of left female breast: Secondary | ICD-10-CM

## 2023-08-10 DIAGNOSIS — D4989 Neoplasm of unspecified behavior of other specified sites: Secondary | ICD-10-CM | POA: Diagnosis not present

## 2023-08-10 DIAGNOSIS — M810 Age-related osteoporosis without current pathological fracture: Secondary | ICD-10-CM

## 2023-08-10 DIAGNOSIS — Z86000 Personal history of in-situ neoplasm of breast: Secondary | ICD-10-CM | POA: Diagnosis not present

## 2023-08-10 DIAGNOSIS — Z170 Estrogen receptor positive status [ER+]: Secondary | ICD-10-CM

## 2023-08-10 DIAGNOSIS — Z7901 Long term (current) use of anticoagulants: Secondary | ICD-10-CM | POA: Insufficient documentation

## 2023-08-10 DIAGNOSIS — I2699 Other pulmonary embolism without acute cor pulmonale: Secondary | ICD-10-CM | POA: Diagnosis not present

## 2023-08-10 DIAGNOSIS — K5790 Diverticulosis of intestine, part unspecified, without perforation or abscess without bleeding: Secondary | ICD-10-CM

## 2023-08-10 DIAGNOSIS — N321 Vesicointestinal fistula: Secondary | ICD-10-CM

## 2023-08-10 LAB — RETICULOCYTES
Immature Retic Fract: 15.7 % (ref 2.3–15.9)
RBC.: 3.73 MIL/uL — ABNORMAL LOW (ref 3.87–5.11)
Retic Count, Absolute: 90.3 10*3/uL (ref 19.0–186.0)
Retic Ct Pct: 2.4 % (ref 0.4–3.1)

## 2023-08-10 LAB — CBC WITH DIFFERENTIAL (CANCER CENTER ONLY)
Abs Immature Granulocytes: 0.06 10*3/uL (ref 0.00–0.07)
Basophils Absolute: 0.1 10*3/uL (ref 0.0–0.1)
Basophils Relative: 0 %
Eosinophils Absolute: 0.1 10*3/uL (ref 0.0–0.5)
Eosinophils Relative: 1 %
HCT: 36.2 % (ref 36.0–46.0)
Hemoglobin: 11.7 g/dL — ABNORMAL LOW (ref 12.0–15.0)
Immature Granulocytes: 1 %
Lymphocytes Relative: 11 %
Lymphs Abs: 1.4 10*3/uL (ref 0.7–4.0)
MCH: 31.5 pg (ref 26.0–34.0)
MCHC: 32.3 g/dL (ref 30.0–36.0)
MCV: 97.3 fL (ref 80.0–100.0)
Monocytes Absolute: 1.1 10*3/uL — ABNORMAL HIGH (ref 0.1–1.0)
Monocytes Relative: 9 %
Neutro Abs: 10.5 10*3/uL — ABNORMAL HIGH (ref 1.7–7.7)
Neutrophils Relative %: 78 %
Platelet Count: 292 10*3/uL (ref 150–400)
RBC: 3.72 MIL/uL — ABNORMAL LOW (ref 3.87–5.11)
RDW: 14.6 % (ref 11.5–15.5)
WBC Count: 13.2 10*3/uL — ABNORMAL HIGH (ref 4.0–10.5)
nRBC: 0 % (ref 0.0–0.2)

## 2023-08-10 LAB — CMP (CANCER CENTER ONLY)
ALT: 15 U/L (ref 0–44)
AST: 18 U/L (ref 15–41)
Albumin: 4.5 g/dL (ref 3.5–5.0)
Alkaline Phosphatase: 89 U/L (ref 38–126)
Anion gap: 11 (ref 5–15)
BUN: 40 mg/dL — ABNORMAL HIGH (ref 8–23)
CO2: 23 mmol/L (ref 22–32)
Calcium: 9.9 mg/dL (ref 8.9–10.3)
Chloride: 106 mmol/L (ref 98–111)
Creatinine: 0.98 mg/dL (ref 0.44–1.00)
GFR, Estimated: >60 mL/min (ref >60)
Glucose, Bld: 95 mg/dL (ref 70–99)
Potassium: 4.3 mmol/L (ref 3.5–5.1)
Sodium: 140 mmol/L (ref 135–145)
Total Bilirubin: 0.7 mg/dL (ref 0.0–1.2)
Total Protein: 7.1 g/dL (ref 6.5–8.1)

## 2023-08-10 LAB — FERRITIN: Ferritin: 60 ng/mL (ref 11–307)

## 2023-08-10 LAB — VITAMIN B12: Vitamin B-12: 175 pg/mL — ABNORMAL LOW (ref 180–914)

## 2023-08-10 LAB — LACTATE DEHYDROGENASE: LDH: 197 U/L — ABNORMAL HIGH (ref 98–192)

## 2023-08-10 NOTE — Progress Notes (Signed)
 Hematology and Oncology Follow Up Visit  Faith Massey [id number] 05/07/1945 78 y.o. 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 Retta Caster   Hoarseness of voice 05/09/2023   HTN (hypertension) 03/28/2022   Hyperlipidemia    Hypertension    Laceration of muscle, fascia and tendon of long head of biceps, unspecified arm, initial encounter    right arm  involving rotator  cuff   Myasthenia gravis (HCC)    Dx'd 03/2022   Myasthenic syndrome (HCC) 04/12/2022   Osteoporosis 10/12/2012   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 10/16/2012   Pneumonia    Pre-diabetes    Prediabetes 05/25/2022   Pyelonephritis 11/10/2022   Right knee injury 02/14/2017   S/P radiation therapy 05/09/2023   S/P Robotic Assisted Right Video Thoracoscopy with resection of Thymus 04/25/2022   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 Faith Massey is here for follow-up.  She actually is going to have right shoulder surgery next Friday.  Will be a reverse shoulder surgery.  I told her to stop her Eliquis  3 days before the procedure.  I then told her to restart the Eliquis  2 days after the procedure.  We should be okay from a standpoint of bleeding..    Otherwise she is doing okay.  She is still recovering from the colon surgery that she had back in November.  She had diverticular disease.  I think back in early May, she had a cardiac  cath.  She was having some chest discomfort.  Thankfully, there is nothing that was found that looks like significant coronary artery disease.   Thankfully, this been no problems with respect to the thymoma.  She has had no cough.  There is been no hoarseness.  Her voice really sounds quite good.  She has had no fever.  She has had no bleeding.  There is been no leg swelling.  Overall, I would have to say that her performance status is probably ECOG 1.    Wt Readings from Last 3 Encounters:  08/10/23 110 lb (49.9 kg)  07/28/23 112 lb (50.8 kg)  07/20/23 114 lb 10.2 oz (52 kg)     Medications:   Current Outpatient Medications:    UNABLE TO FIND, Take by mouth at bedtime. Med Name: A quarter of a CBD gummy daily at bedtime., Disp: , Rfl:    acetaminophen  (TYLENOL ) 650 MG CR tablet, Take 1,300 mg by mouth every 8 (eight) hours as needed for pain., Disp: , Rfl:    apixaban  (ELIQUIS ) 2.5 MG TABS tablet, Take 1 tablet (2.5 mg total) by mouth 2 (two) times daily., Disp: , Rfl:    BLACK ELDERBERRY PO, Take 1 capsule by mouth daily as needed (immune support)., Disp: , Rfl:    Cholecalciferol (VITAMIN D-3) 5000 UNITS TABS, Take 5,000 Units by mouth daily., Disp: , Rfl:    COLLAGEN PO, Take 1 Scoop by mouth  daily., Disp: , Rfl:    lisinopril  (ZESTRIL ) 20 MG tablet, Take 1 tablet (20 mg total) by mouth daily. (Patient taking differently: Take 30 mg by mouth daily. Take one tablet and a half, total of 30 mg daily.), Disp: 30 tablet, Rfl: 2   Melatonin 10 MG CAPS, Take 10 mg by mouth at bedtime., Disp: , Rfl:    Multiple Vitamins-Minerals (MULTIVITAMIN WITH MINERALS) tablet, Take 1 tablet by mouth daily., Disp: , Rfl:    OCTAGAM 20 GM/200ML SOLN, Inject 50 g into the vein. 03/29/2023 Takes 50 grams for 2 days every 3 weeks., Disp: , Rfl:    OCTAGAM 5 GM/50ML SOLN, , Disp: , Rfl:    Probiotic Product (PROBIOTIC PO), Take 1-2 capsules by mouth daily., Disp: , Rfl:    pyridostigmine  (MESTINON ) 60 MG  tablet, Take 1 tablet (60 mg total) by mouth 4 (four) times daily as needed., Disp: 90 tablet, Rfl: 6   traMADol  (ULTRAM ) 50 MG tablet, Take 1 tablet (50 mg total) by mouth every 6 (six) hours as needed., Disp: 20 tablet, Rfl: 0  Allergies:  Allergies  Allergen Reactions   Prednisone  Shortness Of Breath, Swelling, Palpitations, Dermatitis and Hypertension     Reports medication caused swelling and metallic taste in mouth.    Past Medical History, Surgical history, Social history, and Family History were reviewed and updated.  Review of Systems: Review of Systems  Constitutional: Negative.   HENT:   Positive for voice change.   Eyes: Negative.   Respiratory: Negative.    Cardiovascular: Negative.   Gastrointestinal: Negative.   Endocrine: Negative.   Genitourinary: Negative.    Musculoskeletal: Negative.   Skin: Negative.   Neurological: Negative.   Hematological: Negative.   Psychiatric/Behavioral: Negative.      Physical Exam:  height is 4\' 11"  (1.499 m) and weight is 110 lb (49.9 kg). Her oral temperature is 97.5 F (36.4 C) (abnormal). Her blood pressure is 133/60 and her pulse is 98. Her respiration is 18 and oxygen saturation is 100%.   Physical Exam Vitals reviewed.  HENT:     Head: Normocephalic and atraumatic.  Eyes:     Pupils: Pupils are equal, round, and reactive to light.  Cardiovascular:     Rate and Rhythm: Normal rate and regular rhythm.     Heart sounds: Normal heart sounds.  Pulmonary:     Effort: Pulmonary effort is normal.     Breath sounds: Normal breath sounds.  Abdominal:     General: Bowel sounds are normal.     Palpations: Abdomen is soft.  Musculoskeletal:        General: No tenderness or deformity. Normal range of motion.     Cervical back: Normal range of motion.     Comments: She has limited range of motion of the right arm.  Lymphadenopathy:     Cervical: No cervical adenopathy.  Skin:    General: Skin is warm and dry.     Findings:  No erythema or rash.  Neurological:     Mental Status: She is alert and oriented to person, place, and time.  Psychiatric:        Behavior: Behavior normal.        Thought Content: Thought content normal.        Judgment: Judgment normal.     Lab Results  Component Value Date   WBC 13.2 (H) 08/10/2023   HGB 11.7 (L) 08/10/2023   HCT 36.2 08/10/2023   MCV 97.3 08/10/2023  PLT 292 08/10/2023     Chemistry      Component Value Date/Time   NA 139 07/21/2023 0304   NA 142 09/12/2022 1204   K 3.9 07/21/2023 0304   CL 110 07/21/2023 0304   CO2 19 (L) 07/21/2023 0304   BUN 21 07/21/2023 0304   BUN 17 09/12/2022 1204   CREATININE 0.78 07/21/2023 0304   CREATININE 0.81 05/16/2023 1208   CREATININE 0.61 05/06/2013 1003      Component Value Date/Time   CALCIUM  8.6 (L) 07/21/2023 0304   ALKPHOS 68 05/16/2023 1208   AST 18 05/16/2023 1208   ALT 19 05/16/2023 1208   BILITOT 0.5 05/16/2023 1208      Assessment and Plan-   Ms. Knopf is a very charming 78 year old white female.  She is followed up for her thymoma and for the pulmonary embolism.  Everything looks fantastic from my point of view.  I do not see a problem with her having shoulder surgery.  Everything should be okay even though she is on blood thinner.  Again, I told her how we need to stop the Eliquis  so that she will have any problems with bleeding.  At some point, we probably need to get another CT scan of the chest.  I probably would set this up for about 3 months or so.  I want to make sure that she is able to recover from her shoulder surgery before she has any additional radiologic studies.  We will plan to see her back probably after Labor Day.  By then, hopefully she will be out of a sling and having better use of her right arm.  This can always be stopped.

## 2023-08-11 ENCOUNTER — Ambulatory Visit: Payer: Self-pay | Admitting: Hematology & Oncology

## 2023-08-11 DIAGNOSIS — G7001 Myasthenia gravis with (acute) exacerbation: Secondary | ICD-10-CM | POA: Diagnosis not present

## 2023-08-11 DIAGNOSIS — G709 Myoneural disorder, unspecified: Secondary | ICD-10-CM | POA: Diagnosis not present

## 2023-08-11 LAB — IRON AND IRON BINDING CAPACITY (CC-WL,HP ONLY)
Iron: 98 ug/dL (ref 28–170)
Saturation Ratios: 21 % (ref 10.4–31.8)
TIBC: 461 ug/dL — ABNORMAL HIGH (ref 250–450)
UIBC: 363 ug/dL (ref 148–442)

## 2023-08-18 DIAGNOSIS — G8918 Other acute postprocedural pain: Secondary | ICD-10-CM | POA: Diagnosis not present

## 2023-08-18 DIAGNOSIS — M75121 Complete rotator cuff tear or rupture of right shoulder, not specified as traumatic: Secondary | ICD-10-CM | POA: Diagnosis not present

## 2023-08-31 ENCOUNTER — Ambulatory Visit: Payer: Self-pay

## 2023-08-31 DIAGNOSIS — G709 Myoneural disorder, unspecified: Secondary | ICD-10-CM | POA: Diagnosis not present

## 2023-08-31 DIAGNOSIS — G7001 Myasthenia gravis with (acute) exacerbation: Secondary | ICD-10-CM | POA: Diagnosis not present

## 2023-08-31 NOTE — Telephone Encounter (Addendum)
 FYI Only or Action Required?: FYI only for provider  Patient was last seen in primary care on 07/20/2023 by Copland, Skipper Dumas, MD. Called Nurse Triage reporting Chest Pain. Symptoms began today. Interventions attempted: Rest, hydration, or home remedies. Symptoms are: gradually improving.  Triage Disposition: Go to ED Now (Notify PCP)  Patient/caregiver understands and will follow disposition?: No, wishes to speak with PCP        Copied from CRM [id number]. Topic: Clinical - Red Word Triage >> Aug 31, 2023 11:50 AM Pam Bode wrote: Red Word that prompted transfer to Nurse Triage: Patient is calling because she had chest pains a hour ago. Also she can't get voicemails she said call back until she answers. Reason for Disposition  History of prior blood clot in leg or lungs (i.e., deep vein thrombosis, pulmonary embolism)  Answer Assessment - Initial Assessment Questions 1. LOCATION: Where does it hurt?       Chest - in the middle; heartburn 2. RADIATION: Does the pain go anywhere else? (e.g., into neck, jaw, arms, back)     Denies  3. ONSET: When did the chest pain begin? (Minutes, hours or days)      Occurred a couple hours ago 4. PATTERN: Does the pain come and go, or has it been constant since it started?  Does it get worse with exertion?      Comes and goes 5. DURATION: How long does it last (e.g., seconds, minutes, hours)     10 minutes, then resolved 6. SEVERITY: How bad is the pain?  (e.g., Scale 1-10; mild, moderate, or severe)    - MILD (1-3): doesn't interfere with normal activities     - MODERATE (4-7): interferes with normal activities or awakens from sleep    - SEVERE (8-10): excruciating pain, unable to do any normal activities       It was pretty intense, I give it an 8/10 7. CARDIAC RISK FACTORS: Do you have any history of heart problems or risk factors for heart disease? (e.g., angina, prior heart attack; diabetes, high blood pressure, high  cholesterol, smoker, or strong family history of heart disease)     HTN, STEMI 8. PULMONARY RISK FACTORS: Do you have any history of lung disease?  (e.g., blood clots in lung, asthma, emphysema, birth control pills)     Hx of blood clot 9. CAUSE: What do you think is causing the chest pain?     Not sure  10. OTHER SYMPTOMS: Do you have any other symptoms? (e.g., dizziness, nausea, vomiting, sweating, fever, difficulty breathing, cough)        Got a little nauseous w/ the CP  Code STEMI activated 5/1 - cath was clear  5/30 - shoulder surgery, currently recovering at her daughter's house  Hx of myesthesia gravis   Denies difficulty breathing or SOB, denies dizziness, denies vomiting, denies sweating, denies palpitations  Due to have IVIG treatments, nurse coming today  Protocols used: Chest Pain-A-AH

## 2023-08-31 NOTE — Telephone Encounter (Signed)
 Copied from CRM [id number]. Topic: Clinical - Red Word Triage >> Aug 31, 2023 11:50 AM Pam Bode wrote: Red Word that prompted transfer to Nurse Triage: Patient is calling because she had chest pains a hour ago. Also she can't get voicemails she said call back until she answers.

## 2023-08-31 NOTE — Telephone Encounter (Signed)
FYI. Pt going to ED.  

## 2023-08-31 NOTE — Telephone Encounter (Signed)
See NT encounter

## 2023-09-01 DIAGNOSIS — G7001 Myasthenia gravis with (acute) exacerbation: Secondary | ICD-10-CM | POA: Diagnosis not present

## 2023-09-01 DIAGNOSIS — G709 Myoneural disorder, unspecified: Secondary | ICD-10-CM | POA: Diagnosis not present

## 2023-09-04 DIAGNOSIS — Z96611 Presence of right artificial shoulder joint: Secondary | ICD-10-CM | POA: Diagnosis not present

## 2023-09-05 ENCOUNTER — Telehealth: Payer: Self-pay

## 2023-09-05 MED ORDER — LISINOPRIL 20 MG PO TABS: 30.0000 mg | ORAL_TABLET | Freq: Every day | ORAL | 1 refills | Status: DC

## 2023-09-05 NOTE — Telephone Encounter (Signed)
 Copied from CRM [id number]. Topic: Clinical - Medication Question >> Sep 05, 2023  8:22 AM Deaijah H wrote: Reason for CRM: Patient would like to make sure Dr. Geralyn Knee know about her surgery on 6/19 and also changed MG on lisinopril  (ZESTRIL ) 20 MG tablet and Dr. Geralyn Knee changed it to 30MG  but is low on medication.

## 2023-09-05 NOTE — Patient Instructions (Signed)
 SURGICAL WAITING ROOM VISITATION  Patients having surgery or a procedure may have no more than 2 support people in the waiting area - these visitors may rotate.    Children under the age of 36 must have an adult with them who is not the patient.  Visitors with respiratory illnesses are discouraged from visiting and should remain at home.  If the patient needs to stay at the hospital during part of their recovery, the visitor guidelines for inpatient rooms apply. Pre-op nurse will coordinate an appropriate time for 1 support person to accompany patient in pre-op.  This support person may not rotate.    Please refer to the [facility] website for the visitor guidelines for Inpatients (after your surgery is over and you are in a regular room).       Your procedure is scheduled on:  09/07/2023    Report to [facility] Main Entrance    Report to admitting at  1245 pm    Call this number if you have problems the morning of surgery [phone]   Do not eat food :After Midnight.   After Midnight you may have the following liquids until __ 1200 noon   Water  Non-Citrus Juices (without pulp, NO RED-Apple, White grape, White cranberry) Black Coffee (NO MILK/CREAM OR CREAMERS, sugar ok)  Clear Tea (NO MILK/CREAM OR CREAMERS, sugar ok) regular and decaf                             Plain Jell-O (NO RED)                                           Fruit ices (not with fruit pulp, NO RED)                                     Popsicles (NO RED)                                                               Sports drinks like Gatorade (NO RED)                            If you have questions, please contact your surgeon's office.       Oral Hygiene is also important to reduce your risk of infection.                                    Remember - BRUSH YOUR TEETH THE MORNING OF SURGERY WITH YOUR REGULAR TOOTHPASTE  DENTURES WILL BE REMOVED PRIOR TO SURGERY PLEASE DO NOT APPLY Poly  grip OR ADHESIVES!!!   Do NOT smoke after Midnight   Stop all vitamins and herbal supplements 7 days before surgery.   Take these medicines the morning of surgery with A SIP OF WATER :  none   DO NOT TAKE ANY ORAL DIABETIC MEDICATIONS DAY OF YOUR SURGERY  Bring CPAP mask and tubing day of  surgery.                              You may not have any metal on your body including hair pins, jewelry, and body piercing             Do not wear make-up, lotions, powders, perfumes/cologne, or deodorant  Do not wear nail polish including gel and S&S, artificial/acrylic nails, or any other type of covering on natural nails including finger and toenails. If you have artificial nails, gel coating, etc. that needs to be removed by a nail salon please have this removed prior to surgery or surgery may need to be canceled/ delayed if the surgeon/ anesthesia feels like they are unable to be safely monitored.   Do not shave  48 hours prior to surgery.               Men may shave face and neck.   Do not bring valuables to the hospital. [facility] IS NOT             RESPONSIBLE   FOR VALUABLES.   Contacts, glasses, dentures or bridgework may not be worn into surgery.   Bring small overnight bag day of surgery.   DO NOT BRING YOUR HOME MEDICATIONS TO THE HOSPITAL. PHARMACY WILL DISPENSE MEDICATIONS LISTED ON YOUR MEDICATION LIST TO YOU DURING YOUR ADMISSION IN THE HOSPITAL!    Patients discharged on the day of surgery will not be allowed to drive home.  Someone NEEDS to stay with you for the first 24 hours after anesthesia.   Special Instructions: Bring a copy of your healthcare power of attorney and living will documents the day of surgery if you haven't scanned them before.              Please read over the following fact sheets you were given: IF YOU HAVE QUESTIONS ABOUT YOUR PRE-OP INSTRUCTIONS PLEASE CALL [phone]   If you received a COVID test during your pre-op visit  it is requested that  you wear a mask when out in public, stay away from anyone that may not be feeling well and notify your surgeon if you develop symptoms. If you test positive for Covid or have been in contact with anyone that has tested positive in the last 10 days please notify you surgeon.    [facility] - Preparing for Surgery Before surgery, you can play an important role.  Because skin is not sterile, your skin needs to be as free of germs as possible.  You can reduce the number of germs on your skin by washing with CHG (chlorahexidine gluconate) soap before surgery.  CHG is an antiseptic cleaner which kills germs and bonds with the skin to continue killing germs even after washing. Please DO NOT use if you have an allergy to CHG or antibacterial soaps.  If your skin becomes reddened/irritated stop using the CHG and inform your nurse when you arrive at Short Stay. Do not shave (including legs and underarms) for at least 48 hours prior to the first CHG shower.  You may shave your face/neck. Please follow these instructions carefully:  1.  Shower with CHG Soap the night before surgery and the  morning of Surgery.  2.  If you choose to wash your hair, wash your hair first as usual with your  normal  shampoo.  3.  After you shampoo, rinse your hair and body thoroughly to  remove the  shampoo.                           4.  Use CHG as you would any other liquid soap.  You can apply chg directly  to the skin and wash                       Gently with a scrungie or clean washcloth.  5.  Apply the CHG Soap to your body ONLY FROM THE NECK DOWN.   Do not use on face/ open                           Wound or open sores. Avoid contact with eyes, ears mouth and genitals (private parts).                       Wash face,  Genitals (private parts) with your normal soap.             6.  Wash thoroughly, paying special attention to the area where your surgery  will be performed.  7.  Thoroughly rinse your body with warm water  from the  neck down.  8.  DO NOT shower/wash with your normal soap after using and rinsing off  the CHG Soap.                9.  Pat yourself dry with a clean towel.            10.  Wear clean pajamas.            11.  Place clean sheets on your bed the night of your first shower and do not  sleep with pets. Day of Surgery : Do not apply any lotions/deodorants the morning of surgery.  Please wear clean clothes to the hospital/surgery center.  FAILURE TO FOLLOW THESE INSTRUCTIONS MAY RESULT IN THE CANCELLATION OF YOUR SURGERY PATIENT SIGNATURE_________________________________  NURSE SIGNATURE__________________________________  ________________________________________________________________________

## 2023-09-05 NOTE — Addendum Note (Signed)
 Addended by: Gates Kasal C on: 09/05/2023 09:39 AM   Modules accepted: Orders

## 2023-09-06 ENCOUNTER — Encounter (HOSPITAL_COMMUNITY): Payer: Self-pay

## 2023-09-06 ENCOUNTER — Other Ambulatory Visit: Payer: Self-pay

## 2023-09-06 ENCOUNTER — Encounter (HOSPITAL_COMMUNITY)
Admission: RE | Admit: 2023-09-06 | Discharge: 2023-09-06 | Disposition: A | Discharge status: Home / Self Care | Source: Ambulatory Visit | Attending: Orthopedic Surgery | Admitting: Orthopedic Surgery

## 2023-09-06 VITALS — Ht 59.0 in | Wt 110.0 lb

## 2023-09-06 DIAGNOSIS — Z01818 Encounter for other preprocedural examination: Secondary | ICD-10-CM

## 2023-09-06 HISTORY — DX: Dyspnea, unspecified: R06.00

## 2023-09-06 NOTE — Progress Notes (Signed)
 Anesthesia Chart Review   Case: [id number] Date/Time: 09/07/23 1503   Procedure: REVISION, REVERSE TOTAL ARTHROPLASTY, SHOULDER (Right: Shoulder) - HEMI ARTHROPLASTY, BONEGRAFT GLENOID   Anesthesia type: General   Diagnosis: Failure of arthroplasty, initial encounter (HCC) [T84.019A]   Pre-op diagnosis: FAILED RIGHT REVERSE SHOULDER ARTHROPLASTY   Location[facility] ROOM 06 / [facility]   Surgeons: Faith Gunning, MD       DISCUSSION:77 y.o. never smoker with h/o HTN, PE, breast cancer, myasthenia gravis, CVA, revision of right shoulder arthroplasty scheduled for above procedure 09/07/2023 with Dr. Ellard Massey.   H/o myasthenia gravis, follows with neurology.  Pt last seen by neurology 05/09/2023. Per notes myasthenia gravis under excellent control, no swallowing difficulty.  Exam at this visit with excellent motor strength, no bulbar or limb muscle weakness, no head drop. She experiences right ptosis, lack of stamina, easy fatigued. She is on Mestinon  60 mg twice daily as needed. Pt advised to take Mestinon  DOS.   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 A CT scan performed on 04/05/2023 revealed no evidence of recurrent disease in her chest.  Pt was advised by Dr. Maria Massey to hold Eliquis  3 days prior to procedure. Pt reports she took her last dose of Eliquis  09/04/2023.   Pt seen by ENT 05/02/23 for evaluation of voice hoarseness. Laryngoscopy at this time with No mass or ulceration in pharynx or larynx. Vocal folds both mobile. Some bowing with small glottal gap with phonation. Vocal processes overlap slightly during phonation with left over right.   Low risk stress test 2024.   Cardiac cath 07/20/23 with no evidence of CAD.   Per cardiology preoperative evaluation 07/28/2023, Patient to undergo right shoulder arthroplasty with Dr. Ernestina Massey supple.  Faith Massey's perioperative risk of a major cardiac event is 0.4% according to the Revised Cardiac Risk Index (RCRI).  Therefore, she is at low risk  for perioperative complications.   Her functional capacity is good at 5.07 METs according to the Duke Activity Status Index (DASI).  Patient with cardiac catheterization earlier this month that showed no evidence of CAD, echocardiogram also reassuring with no evidence of pericarditis.  Patient denies any further chest discomfort or shortness of breath. Recommendations:According to ACC/AHA guidelines, no further cardiovascular testing needed.  The patient may proceed to surgery at acceptable risk.  Antiplatelet and/or Anticoagulation Recommendations: Patient is on Eliquis  for history of PE, recommendations regarding holding of Eliquis  will need to come from prescribing office.  Per notes in chart pt experienced chest discomfort 08/31/23 and was advised to go to the ED.  Pt did not go to the ED, she reports symptoms resolved with TUMS.  VS: There were no vitals taken for this visit.  PROVIDERS: Faith Massey, Faith Dumas, MD is PCP    LABS: Labs reviewed: Acceptable for surgery. (all labs ordered are listed, but only abnormal results are displayed)  Labs Reviewed - No data to display   IMAGES:   EKG:   CV: Cardiac Cath 07/20/2023 No angiographic evidence of CAD   Recommendations: Monitor overnight. EKG changes likely due to pericarditis. Echo tomorrow.     Echo 07/21/2023 1. Left ventricular ejection fraction, by estimation, is 65 to 70%. The  left ventricle has normal function. The left ventricle has no regional  wall motion abnormalities. There is mild asymmetric left ventricular  hypertrophy of the basal-septal segment.  Left ventricular diastolic parameters are consistent with Grade I  diastolic dysfunction (impaired relaxation).   2. Right ventricular systolic function is  normal. The right ventricular  size is normal.   3. The mitral valve is normal in structure. Trivial mitral valve  regurgitation. No evidence of mitral stenosis.   4. The aortic valve is tricuspid. There is mild  calcification of the  aortic valve. Aortic valve regurgitation is not visualized. Aortic valve  sclerosis/calcification is present, without any evidence of aortic  stenosis.   5. The inferior vena cava is normal in size with greater than 50%  respiratory variability, suggesting right atrial pressure of 3 mmHg.   Myocardial Perfusion 01/03/2023   The study is normal. The study is low risk.   No ST deviation was noted.   LV perfusion is normal.   Left ventricular function is normal. End diastolic cavity size is normal. End systolic cavity size is normal.   Prior study not available for comparison.   Normal perfusion. LVEF 71% with normal wall motion. This is a low risk study. No prior for comparison.   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 Retta Massey   Hoarseness of voice 05/09/2023   HTN (hypertension) 03/28/2022   Hyperlipidemia    Hypertension    Laceration of muscle, fascia and tendon of long head of biceps, unspecified arm, initial encounter    right arm  involving rotator  cuff   Myasthenia gravis (HCC)    Dx'd 03/2022   Myasthenic syndrome (HCC) 04/12/2022   Osteoporosis 10/12/2012   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 10/16/2012   Pneumonia    Pre-diabetes    Prediabetes 05/25/2022   Pyelonephritis 11/10/2022   Right knee injury 02/14/2017   S/P radiation therapy 05/09/2023   S/P Robotic Assisted Right Video Thoracoscopy with resection of Thymus 04/25/2022   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    Past Surgical History:  Procedure Laterality Date   ABDOMINAL HYSTERECTOMY  03/22/2003   BACK SURGERY  03/21/1998   BREAST SURGERY      reconstruction   CYSTOSCOPY WITH STENT PLACEMENT N/A 02/08/2023   Procedure: POSSIBLE CYSTOSCOPY WITH URETERAL STENT PLACEMENT;  Surgeon: Faith Massey., MD;  Location: [facility];  Service: Urology;  Laterality: N/A;   LEFT HEART CATH AND CORONARY ANGIOGRAPHY N/A 07/20/2023   Procedure: LEFT HEART CATH AND CORONARY ANGIOGRAPHY;  Surgeon: Odie Benne, MD;  Location: [facility];  Service: Cardiovascular;  Laterality: N/A;   MASTECTOMY Bilateral 03/21/1993   RESECTION OF A THYMOMA     04-25-22    MEDICATIONS:  acetaminophen  (TYLENOL ) 650 MG CR tablet   apixaban  (ELIQUIS ) 2.5 MG TABS tablet   BLACK ELDERBERRY PO   Cholecalciferol (VITAMIN D-3) 5000 UNITS TABS   COLLAGEN PO   lisinopril  (ZESTRIL ) 20 MG tablet   Melatonin 10 MG CAPS   Multiple Vitamins-Minerals (MULTIVITAMIN WITH MINERALS) tablet   OCTAGAM 20 GM/200ML SOLN   OCTAGAM 5 GM/50ML SOLN   OVER THE COUNTER MEDICATION   OVER THE COUNTER MEDICATION   oxyCODONE -acetaminophen  (PERCOCET/ROXICET) 5-325 MG tablet   Probiotic Product (PROBIOTIC PO)   pyridostigmine  (MESTINON ) 60 MG tablet   traMADol  (ULTRAM ) 50 MG tablet   UNABLE TO FIND   No current facility-administered medications for  this encounter.    Chick Cotton Ward, PA-C [facility]Surgical [facility] [phone]

## 2023-09-06 NOTE — Anesthesia Preprocedure Evaluation (Signed)
 Anesthesia Evaluation  Patient identified by MRN, date of birth, ID band Patient awake    Reviewed: Allergy & Precautions, NPO status , Patient's Chart, lab work & pertinent test results  Airway Mallampati: III  TM Distance: >3 FB Neck ROM: Full    Dental  (+) Teeth Intact, Dental Advisory Given   Pulmonary PE   Pulmonary exam normal breath sounds clear to auscultation       Cardiovascular hypertension (156/86 preop), Pt. on medications Normal cardiovascular exam Rhythm:Regular Rate:Normal  Echo 07/2023 1. Left ventricular ejection fraction, by estimation, is 65 to 70%. The  left ventricle has normal function. The left ventricle has no regional  wall motion abnormalities. There is mild asymmetric left ventricular  hypertrophy of the basal-septal segment.  Left ventricular diastolic parameters are consistent with Grade I  diastolic dysfunction (impaired relaxation).   2. Right ventricular systolic function is normal. The right ventricular  size is normal.   3. The mitral valve is normal in structure. Trivial mitral valve  regurgitation. No evidence of mitral stenosis.   4. The aortic valve is tricuspid. There is mild calcification of the  aortic valve. Aortic valve regurgitation is not visualized. Aortic valve  sclerosis/calcification is present, without any evidence of aortic  stenosis.   5. The inferior vena cava is normal in size with greater than 50%  respiratory variability, suggesting right atrial pressure of 3 mmHg.   Stress test 2024 normal  Cath 07/2023 no CAD  Continues to have chest pain - frequent ED visits, last one 6/12:      Neuro/Psych  Neuromuscular disease (myasthenia gravis s/p thymectomy. on immune globulin, pyridostigmine  60mg )  negative psych ROS   GI/Hepatic negative GI ROS, Neg liver ROS,,,  Endo/Other  diabetes (prediabetic)    Renal/GU negative Renal ROS  negative genitourinary    Musculoskeletal  (+) Arthritis , Osteoarthritis,    Abdominal   Peds  Hematology negative hematology ROS (+)   Anesthesia Other Findings Tramadol  50mg  2-3x/d Percocet 5mg  2-3x/d  Eliquis - 3 days   Reproductive/Obstetrics negative OB ROS                             Anesthesia Physical Anesthesia Plan  ASA: 3  Anesthesia Plan: General and Regional   Post-op Pain Management: Tylenol  PO (pre-op)* and Regional block*   Induction: Intravenous  PONV Risk Score and Plan: Ondansetron , Midazolam  and Treatment may vary due to age or medical condition  Airway Management Planned: Oral ETT  Additional Equipment: None  Intra-op Plan:   Post-operative Plan: Extubation in OR  Informed Consent: I have reviewed the patients History and Physical, chart, labs and discussed the procedure including the risks, benefits and alternatives for the proposed anesthesia with the patient or authorized representative who has indicated his/her understanding and acceptance.     Dental advisory given  Plan Discussed with: CRNA  Anesthesia Plan Comments: (Did not take mestinon  this AM- will need dose from pharmacy before surgery No steroids )       Anesthesia Quick Evaluation

## 2023-09-06 NOTE — Progress Notes (Addendum)
 Anesthesia Review:  PCP: Faith Massey  Cardiologist : Faith Massey  Faith West,NP LOV 07/28/23  [name]   PPM/ ICD: Device Orders: Rep Notified:  Chest x-ray : 07/20/23  CT Chest- 04/14/23.  EKG : 07/28/23  Echo :07/21/23  Stress test: 12/2022  Cardiac Cath :  07/20/23  Monitor- 04/2022   Activity level: can do a flight of stiars without diffiucdtly  Sleep Study/ CPAP : none  Fasting Blood Sugar :      / Checks Blood Sugar -- times a day:    Blood Thinner/ Instructions /Last Dose: ASA / Instructions/ Last Dose :    Eliquis - last dose on 09/04/23 in the pm Faith Massey aware.    Faith Massey is a natural redhead per Faith Massey .Faith Massey aware.     Faith Massey spoke with Faith Massey over phone in regards to chest pain on 08/31/23.     08/18/2023- shoulder surgery    Preop phone call with med hx and preop instructons completed.  Faith Massey aware at end of phone call to call ADmitting at [phone].  Faith Massey also aware to take Mestinon  am of surgery. Faith Massey was also aware to eat a late nite snack of protein sandwich before 12 midnite on 09/06/2023 prior to midnite.  Faith Massey voiced understanding.

## 2023-09-07 ENCOUNTER — Encounter (HOSPITAL_COMMUNITY): Payer: Self-pay | Admitting: Orthopedic Surgery

## 2023-09-07 ENCOUNTER — Inpatient Hospital Stay (HOSPITAL_COMMUNITY): Payer: Self-pay | Admitting: Anesthesiology

## 2023-09-07 ENCOUNTER — Other Ambulatory Visit: Payer: Self-pay

## 2023-09-07 ENCOUNTER — Inpatient Hospital Stay (HOSPITAL_COMMUNITY)
Admission: RE | Admit: 2023-09-07 | Discharge: 2023-09-07 | DRG: 483 | Disposition: A | Discharge status: Home / Self Care | Attending: Orthopedic Surgery | Admitting: Orthopedic Surgery

## 2023-09-07 ENCOUNTER — Encounter (HOSPITAL_COMMUNITY)
Admission: RE | Disposition: A | Discharge status: Home / Self Care | Payer: Self-pay | Source: Home / Self Care | Attending: Orthopedic Surgery

## 2023-09-07 DIAGNOSIS — Z9013 Acquired absence of bilateral breasts and nipples: Secondary | ICD-10-CM | POA: Diagnosis not present

## 2023-09-07 DIAGNOSIS — Z923 Personal history of irradiation: Secondary | ICD-10-CM

## 2023-09-07 DIAGNOSIS — I10 Essential (primary) hypertension: Secondary | ICD-10-CM | POA: Diagnosis present

## 2023-09-07 DIAGNOSIS — I679 Cerebrovascular disease, unspecified: Secondary | ICD-10-CM

## 2023-09-07 DIAGNOSIS — T84098A Other mechanical complication of other internal joint prosthesis, initial encounter: Principal | ICD-10-CM | POA: Diagnosis present

## 2023-09-07 DIAGNOSIS — Z820 Family history of epilepsy and other diseases of the nervous system: Secondary | ICD-10-CM

## 2023-09-07 DIAGNOSIS — M810 Age-related osteoporosis without current pathological fracture: Secondary | ICD-10-CM | POA: Diagnosis present

## 2023-09-07 DIAGNOSIS — Z96611 Presence of right artificial shoulder joint: Secondary | ICD-10-CM | POA: Diagnosis not present

## 2023-09-07 DIAGNOSIS — Z01818 Encounter for other preprocedural examination: Secondary | ICD-10-CM

## 2023-09-07 DIAGNOSIS — R7303 Prediabetes: Secondary | ICD-10-CM | POA: Diagnosis present

## 2023-09-07 DIAGNOSIS — Z823 Family history of stroke: Secondary | ICD-10-CM | POA: Diagnosis not present

## 2023-09-07 DIAGNOSIS — Z79899 Other long term (current) drug therapy: Secondary | ICD-10-CM | POA: Diagnosis not present

## 2023-09-07 DIAGNOSIS — Z7901 Long term (current) use of anticoagulants: Secondary | ICD-10-CM

## 2023-09-07 DIAGNOSIS — Z888 Allergy status to other drugs, medicaments and biological substances status: Secondary | ICD-10-CM

## 2023-09-07 DIAGNOSIS — Z8249 Family history of ischemic heart disease and other diseases of the circulatory system: Secondary | ICD-10-CM

## 2023-09-07 DIAGNOSIS — G7000 Myasthenia gravis without (acute) exacerbation: Secondary | ICD-10-CM | POA: Diagnosis present

## 2023-09-07 DIAGNOSIS — Y792 Prosthetic and other implants, materials and accessory orthopedic devices associated with adverse incidents: Secondary | ICD-10-CM | POA: Diagnosis present

## 2023-09-07 DIAGNOSIS — E785 Hyperlipidemia, unspecified: Secondary | ICD-10-CM | POA: Diagnosis present

## 2023-09-07 DIAGNOSIS — G8918 Other acute postprocedural pain: Secondary | ICD-10-CM | POA: Diagnosis not present

## 2023-09-07 DIAGNOSIS — T84019A Broken internal joint prosthesis, unspecified site, initial encounter: Secondary | ICD-10-CM | POA: Diagnosis present

## 2023-09-07 DIAGNOSIS — Z86711 Personal history of pulmonary embolism: Secondary | ICD-10-CM

## 2023-09-07 DIAGNOSIS — Z85238 Personal history of other malignant neoplasm of thymus: Secondary | ICD-10-CM

## 2023-09-07 DIAGNOSIS — Z853 Personal history of malignant neoplasm of breast: Secondary | ICD-10-CM | POA: Diagnosis not present

## 2023-09-07 DIAGNOSIS — T8489XA Other specified complication of internal orthopedic prosthetic devices, implants and grafts, initial encounter: Secondary | ICD-10-CM | POA: Diagnosis not present

## 2023-09-07 HISTORY — PX: REVISION TOTAL SHOULDER TO REVERSE TOTAL SHOULDER: SHX6313

## 2023-09-07 LAB — SURGICAL PCR SCREEN
MRSA, PCR: NEGATIVE
Staphylococcus aureus: NEGATIVE

## 2023-09-07 LAB — GLUCOSE, CAPILLARY: Glucose-Capillary: 116 mg/dL — ABNORMAL HIGH (ref 70–99)

## 2023-09-07 SURGERY — REVISION, REVERSE TOTAL ARTHROPLASTY, SHOULDER
Anesthesia: General | Site: Shoulder | Laterality: Right

## 2023-09-07 MED ORDER — LIDOCAINE HCL (CARDIAC) PF 100 MG/5ML IV SOSY
PREFILLED_SYRINGE | INTRAVENOUS | Status: DC | PRN
Start: 2023-09-07 — End: 2023-09-07
  Administered 2023-09-07: 30 mg via INTRAVENOUS

## 2023-09-07 MED ORDER — TRANEXAMIC ACID-NACL 1000-0.7 MG/100ML-% IV SOLN
1000.0000 mg | INTRAVENOUS | Status: AC
  Administered 2023-09-07: 1000 mg via INTRAVENOUS
  Filled 2023-09-07: qty 100

## 2023-09-07 MED ORDER — CHLORHEXIDINE GLUCONATE 0.12 % MT SOLN
15.0000 mL | Freq: Once | OROMUCOSAL | Status: AC
  Administered 2023-09-07: 15 mL via OROMUCOSAL

## 2023-09-07 MED ORDER — BUPIVACAINE LIPOSOME 1.3 % IJ SUSP
INTRAMUSCULAR | Status: DC | PRN
  Administered 2023-09-07: 8 mL via PERINEURAL

## 2023-09-07 MED ORDER — ROCURONIUM BROMIDE 100 MG/10ML IV SOLN
INTRAVENOUS | Status: DC | PRN
  Administered 2023-09-07: 30 mg via INTRAVENOUS

## 2023-09-07 MED ORDER — ONDANSETRON HCL 4 MG/2ML IJ SOLN
INTRAMUSCULAR | Status: AC
  Filled 2023-09-07: qty 2

## 2023-09-07 MED ORDER — VANCOMYCIN HCL 1000 MG IV SOLR
INTRAVENOUS | Status: AC
  Filled 2023-09-07: qty 20

## 2023-09-07 MED ORDER — PROPOFOL 10 MG/ML IV BOLUS
INTRAVENOUS | Status: DC | PRN
  Administered 2023-09-07: 100 mg via INTRAVENOUS

## 2023-09-07 MED ORDER — MIDAZOLAM HCL 2 MG/2ML IJ SOLN
1.0000 mg | Freq: Once | INTRAMUSCULAR | Status: AC
  Administered 2023-09-07: 2 mg via INTRAVENOUS
  Filled 2023-09-07: qty 2

## 2023-09-07 MED ORDER — FENTANYL CITRATE PF 50 MCG/ML IJ SOSY: 25.0000 ug | PREFILLED_SYRINGE | INTRAMUSCULAR | Status: DC | PRN

## 2023-09-07 MED ORDER — PHENYLEPHRINE HCL (PRESSORS) 10 MG/ML IV SOLN
INTRAVENOUS | Status: AC
  Filled 2023-09-07: qty 1

## 2023-09-07 MED ORDER — ONDANSETRON HCL 4 MG/2ML IJ SOLN: 4.0000 mg | Freq: Once | INTRAMUSCULAR | Status: DC | PRN

## 2023-09-07 MED ORDER — OXYCODONE HCL 5 MG PO TABS
5.0000 mg | ORAL_TABLET | Freq: Once | ORAL | Status: AC | PRN
  Administered 2023-09-07: 5 mg via ORAL

## 2023-09-07 MED ORDER — DEXAMETHASONE SODIUM PHOSPHATE 10 MG/ML IJ SOLN
INTRAMUSCULAR | Status: AC
  Filled 2023-09-07: qty 1

## 2023-09-07 MED ORDER — SUGAMMADEX SODIUM 200 MG/2ML IV SOLN
INTRAVENOUS | Status: DC | PRN
  Administered 2023-09-07 (×2): 100 mg via INTRAVENOUS

## 2023-09-07 MED ORDER — LACTATED RINGERS IV SOLN: INTRAVENOUS | Status: DC

## 2023-09-07 MED ORDER — TIZANIDINE HCL 4 MG PO TABS: 4.0000 mg | ORAL_TABLET | Freq: Every day | ORAL | 1 refills | Status: DC

## 2023-09-07 MED ORDER — PROPOFOL 10 MG/ML IV BOLUS
INTRAVENOUS | Status: AC
  Filled 2023-09-07: qty 20

## 2023-09-07 MED ORDER — EPHEDRINE SULFATE (PRESSORS) 50 MG/ML IJ SOLN
INTRAMUSCULAR | Status: DC | PRN
  Administered 2023-09-07: 10 mg via INTRAVENOUS

## 2023-09-07 MED ORDER — FENTANYL CITRATE PF 50 MCG/ML IJ SOSY
50.0000 ug | PREFILLED_SYRINGE | Freq: Once | INTRAMUSCULAR | Status: AC
  Administered 2023-09-07: 100 ug via INTRAVENOUS
  Filled 2023-09-07: qty 2

## 2023-09-07 MED ORDER — TRAMADOL HCL 50 MG PO TABS: 50.0000 mg | ORAL_TABLET | Freq: Four times a day (QID) | ORAL | 0 refills | Status: AC | PRN

## 2023-09-07 MED ORDER — PHENYLEPHRINE 80 MCG/ML (10ML) SYRINGE FOR IV PUSH (FOR BLOOD PRESSURE SUPPORT)
PREFILLED_SYRINGE | INTRAVENOUS | Status: DC | PRN
  Administered 2023-09-07: 160 ug via INTRAVENOUS
  Administered 2023-09-07: 80 ug via INTRAVENOUS

## 2023-09-07 MED ORDER — LIDOCAINE HCL (PF) 2 % IJ SOLN
INTRAMUSCULAR | Status: AC
Start: 2023-09-07 — End: 2023-09-07
  Filled 2023-09-07: qty 5

## 2023-09-07 MED ORDER — OXYCODONE HCL 5 MG/5ML PO SOLN: 5.0000 mg | Freq: Once | ORAL | Status: AC | PRN

## 2023-09-07 MED ORDER — ACETAMINOPHEN 500 MG PO TABS
1000.0000 mg | ORAL_TABLET | Freq: Once | ORAL | Status: AC
  Administered 2023-09-07: 1000 mg via ORAL
  Filled 2023-09-07: qty 2

## 2023-09-07 MED ORDER — FENTANYL CITRATE (PF) 100 MCG/2ML IJ SOLN
INTRAMUSCULAR | Status: AC
  Filled 2023-09-07: qty 2

## 2023-09-07 MED ORDER — EPHEDRINE 5 MG/ML INJ
INTRAVENOUS | Status: AC
  Filled 2023-09-07: qty 5

## 2023-09-07 MED ORDER — ALBUMIN HUMAN 5 % IV SOLN
INTRAVENOUS | Status: AC
  Filled 2023-09-07: qty 500

## 2023-09-07 MED ORDER — ROCURONIUM BROMIDE 10 MG/ML (PF) SYRINGE
PREFILLED_SYRINGE | INTRAVENOUS | Status: AC
Start: 2023-09-07 — End: 2023-09-07
  Filled 2023-09-07: qty 10

## 2023-09-07 MED ORDER — PHENYLEPHRINE HCL-NACL 20-0.9 MG/250ML-% IV SOLN
INTRAVENOUS | Status: DC | PRN
  Administered 2023-09-07: 80 ug via INTRAVENOUS
  Administered 2023-09-07: 30 ug/min via INTRAVENOUS
  Administered 2023-09-07 (×2): 80 ug via INTRAVENOUS

## 2023-09-07 MED ORDER — ONDANSETRON HCL 4 MG/2ML IJ SOLN
INTRAMUSCULAR | Status: DC | PRN
  Administered 2023-09-07: 4 mg via INTRAVENOUS

## 2023-09-07 MED ORDER — VANCOMYCIN HCL 1 G IV SOLR
INTRAVENOUS | Status: DC | PRN
  Administered 2023-09-07: 1000 mg via TOPICAL

## 2023-09-07 MED ORDER — CEFAZOLIN SODIUM-DEXTROSE 2-4 GM/100ML-% IV SOLN
2.0000 g | INTRAVENOUS | Status: AC
Start: 2023-09-08 — End: 2023-09-07
  Administered 2023-09-07: 2 g via INTRAVENOUS
  Filled 2023-09-07: qty 100

## 2023-09-07 MED ORDER — OXYCODONE HCL 5 MG PO TABS
ORAL_TABLET | ORAL | Status: AC
  Filled 2023-09-07: qty 1

## 2023-09-07 MED ORDER — STERILE WATER FOR IRRIGATION IR SOLN
Status: DC | PRN
  Administered 2023-09-07: 1000 mL

## 2023-09-07 MED ORDER — OXYCODONE-ACETAMINOPHEN 5-325 MG PO TABS: 1.0000 | ORAL_TABLET | Freq: Four times a day (QID) | ORAL | 0 refills | Status: DC | PRN

## 2023-09-07 MED ORDER — PYRIDOSTIGMINE BROMIDE 60 MG PO TABS
60.0000 mg | ORAL_TABLET | Freq: Once | ORAL | Status: AC
  Administered 2023-09-07: 60 mg via ORAL
  Filled 2023-09-07: qty 1

## 2023-09-07 MED ORDER — ROPIVACAINE HCL 5 MG/ML IJ SOLN
INTRAMUSCULAR | Status: DC | PRN
  Administered 2023-09-07: 10 mL via PERINEURAL

## 2023-09-07 MED ORDER — ORAL CARE MOUTH RINSE: 15.0000 mL | Freq: Once | OROMUCOSAL | Status: AC

## 2023-09-07 MED ORDER — 0.9 % SODIUM CHLORIDE (POUR BTL) OPTIME
TOPICAL | Status: DC | PRN
  Administered 2023-09-07: 1000 mL

## 2023-09-07 MED ORDER — FENTANYL CITRATE (PF) 100 MCG/2ML IJ SOLN
INTRAMUSCULAR | Status: DC | PRN
  Administered 2023-09-07 (×2): 50 ug via INTRAVENOUS

## 2023-09-07 SURGICAL SUPPLY — 57 items
BAG COUNTER SPONGE SURGICOUNT (BAG) ×1 IMPLANT
BAG ZIPLOCK 12X15 (MISCELLANEOUS) ×1 IMPLANT
BLADE SAW SGTL 83.5X18.5 (BLADE) IMPLANT
CLSR STERI-STRIP ANTIMIC 1/2X4 (GAUZE/BANDAGES/DRESSINGS) IMPLANT
COOLER ICEMAN CLASSIC (MISCELLANEOUS) IMPLANT
COVER BACK TABLE 60X90IN (DRAPES) ×1 IMPLANT
COVER SURGICAL LIGHT HANDLE (MISCELLANEOUS) ×1 IMPLANT
CUP SUT UNIV REVERS 36 NEUTRAL (Cup) IMPLANT
DERMABOND ADVANCED .7 DNX12 (GAUZE/BANDAGES/DRESSINGS) ×1 IMPLANT
DRAPE INCISE IOBAN 66X45 STRL (DRAPES) ×1 IMPLANT
DRAPE SHEET LG 3/4 BI-LAMINATE (DRAPES) ×1 IMPLANT
DRAPE SURG 17X11 SM STRL (DRAPES) ×1 IMPLANT
DRAPE SURG ORHT 6 SPLT 77X108 (DRAPES) ×2 IMPLANT
DRAPE U-SHAPE 47X51 STRL (DRAPES) ×1 IMPLANT
DRESSING AQUACEL AG SP 3.5X6 (GAUZE/BANDAGES/DRESSINGS) IMPLANT
DRSG AQUACEL AG ADV 3.5X10 (GAUZE/BANDAGES/DRESSINGS) IMPLANT
DURAPREP 26ML APPLICATOR (WOUND CARE) ×1 IMPLANT
ELECT BLADE TIP CTD 4 INCH (ELECTRODE) ×1 IMPLANT
ELECT PENCIL ROCKER SW 15FT (MISCELLANEOUS) ×1 IMPLANT
ELECT REM PT RETURN 15FT ADLT (MISCELLANEOUS) ×1 IMPLANT
FACESHIELD WRAPAROUND (MASK) ×4 IMPLANT
FACESHIELD WRAPAROUND OR TEAM (MASK) ×4 IMPLANT
GLOVE BIO SURGEON STRL SZ7 (GLOVE) ×1 IMPLANT
GLOVE BIO SURGEON STRL SZ7.5 (GLOVE) ×1 IMPLANT
GLOVE BIO SURGEON STRL SZ8 (GLOVE) ×1 IMPLANT
GLOVE BIOGEL PI IND STRL 7.0 (GLOVE) ×1 IMPLANT
GLOVE BIOGEL PI IND STRL 8 (GLOVE) ×1 IMPLANT
GLOVE ECLIPSE 7.5 STRL STRAW (GLOVE) ×1 IMPLANT
GOWN STRL REUS W/ TWL LRG LVL3 (GOWN DISPOSABLE) ×2 IMPLANT
HEAD HUM REV CA 44/17 (Shoulder) IMPLANT
HEAD HUM REV CA ADAPT 36 (Shoulder) IMPLANT
KIT BASIN OR (CUSTOM PROCEDURE TRAY) ×1 IMPLANT
KIT TURNOVER KIT A (KITS) ×2 IMPLANT
MANIFOLD NEPTUNE II (INSTRUMENTS) ×1 IMPLANT
NDL TAPERED W/ NITINOL LOOP (MISCELLANEOUS) ×1 IMPLANT
NEEDLE TAPERED W/ NITINOL LOOP (MISCELLANEOUS) ×1 IMPLANT
NS IRRIG 1000ML POUR BTL (IV SOLUTION) ×1 IMPLANT
PACK SHOULDER (CUSTOM PROCEDURE TRAY) ×1 IMPLANT
PAD ARMBOARD POSITIONER FOAM (MISCELLANEOUS) ×1 IMPLANT
PAD COLD SHLDR WRAP-ON (PAD) IMPLANT
PIN NITINOL TARGETER 2.8 (PIN) IMPLANT
PIN SET MODULAR GLENOID SYSTEM (PIN) IMPLANT
RESTRAINT HEAD UNIVERSAL NS (MISCELLANEOUS) ×1 IMPLANT
SLING ARM FOAM STRAP LRG (SOFTGOODS) IMPLANT
SLING ARM FOAM STRAP MED (SOFTGOODS) IMPLANT
STEM HUMERAL UNI REVERS SZ6 (Stem) IMPLANT
STRIP CLOSURE SKIN 1/2X4 (GAUZE/BANDAGES/DRESSINGS) ×1 IMPLANT
SUCTION TUBE FRAZIER 12FR DISP (SUCTIONS) ×1 IMPLANT
SUT MNCRL AB 3-0 PS2 18 (SUTURE) ×1 IMPLANT
SUT MON AB 2-0 CT1 36 (SUTURE) ×1 IMPLANT
SUT VIC AB 0 CT1 36 (SUTURE) ×1 IMPLANT
SUT VIC AB 1 CT1 36 (SUTURE) ×1 IMPLANT
SUTURE FIBERWR #2 38 T-5 BLUE (SUTURE) IMPLANT
SUTURE TAPE 1.3 40 TPR END (SUTURE) ×2 IMPLANT
TOWEL OR 17X26 10 PK STRL BLUE (TOWEL DISPOSABLE) ×1 IMPLANT
WATER STERILE IRR 1000ML POUR (IV SOLUTION) ×1 IMPLANT
YANKAUER SUCT BULB TIP 10FT TU (MISCELLANEOUS) ×1 IMPLANT

## 2023-09-07 NOTE — Op Note (Signed)
 09/07/2023  5:11 PM  PATIENT:   Faith Massey  78 y.o. female  PRE-OPERATIVE DIAGNOSIS:  FAILED RIGHT REVERSE SHOULDER ARTHROPLASTY with catastrophic loss of the glenosphere baseplate fixation  POST-OPERATIVE DIAGNOSIS: Same  PROCEDURE: Revision right shoulder reverse arthroplasty with conversion to hemiarthroplasty utilizing a size small Arthrex CTA head, press-fit size 6 Arthrex stem  SURGEON:  Khristi Schiller, Genevieve Kerry M.D.  ASSISTANTS: Rand Burrs, PA-C  Rand Burrs, PA-C was utilized as an Geophysicist/field seismologist throughout this case, essential for help with positioning the patient, positioning extremity, tissue manipulation, implantation of the prosthesis, suture management, wound closure, and intraoperative decision-making.  ANESTHESIA:   General Orotracheal and interscalene block with Exparel   EBL: 150 cc  SPECIMEN: None  Drains: None   PATIENT DISPOSITION:  PACU - hemodynamically stable.    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  However unfortunately on her first postop visit radiographs showed evidence that the glenosphere and baseplate had shifted the position confirming catastrophic loss of fixation.  She is brought to the operating room at this time for planned conversion to hemiarthroplasty.  Preoperatively, I counseled the patient regarding treatment options and risks versus benefits thereof.  Possible surgical complications were all reviewed including potential for bleeding, infection, neurovascular injury, persistent pain, loss of motion, anesthetic complication, failure of the implant, and possible need for additional surgery. They understand and accept and agrees  with our planned procedure.   Procedure in detail:  After undergoing routine preop evaluation the patient received prophylactic antibiotics and interscalene block with Exparel  was established in the holding area by the anesthesia department.  Subsequently placed spine on the operating table and underwent the smooth induction of a general endotracheal anesthesia.  Placed into the beachchair position and appropriately padded and protected.  The right shoulder girdle region was sterilely prepped and draped in standard fashion.  Timeout was called.  A deltopectoral approach the right shoulder is made to the previous incision approximately 8 cm in length.  Skin flaps elevated the previously placed suture material was removed both superficially and deep.  The deltopectoral interval was then redeveloped from proximal to distal with the sutures removed and then adhesions were divided beneath the deltoid of the conjoined tendon was mobilized and retracted medially.  The remnant of the subscapularis was separated from the implant and tagged with a pair of grasping suture tape sutures.  We then dissected in a subperiosteal fashion around the proximal humeral metaphysis and with this we were then able to dislocate the shoulder.  The polyethylene liner was then removed and then we removed the metaphyseal portion of our humeral stem and then off further evaluation found that the distal aspect of the humeral stem was not securely fixed and so the size 5 stem was removed.  We then placed provisionally a short stem size 7 implant with metal To protect the humeral metaphyseal bone and this point we then exposed the glenoid and the central locking screw of the glenosphere was removed and the glenosphere was was removed.  We then visualized the baseplate which was grossly unstable.  The peripheral locking screws were all then removed and the baseplate was then extracted and we did identify  significant comminution of the glenoid  with loss of continuity and this appeared to be an uncontained defect and so we did not apply any bone graft.  The joint was copiously irrigated.  We then returned our attention back to the humerus where we broached up to a size 6 which achieved excellent fixation at 20 degrees of retroversion.  A 36 metaphyseal rim guide was then used.  The metaphysis and given the overall construct we elected to proceed with the final size 6 stem which was opened and assembled and impacted after the canal was irrigated cleaned and dried and vancomycin powder applied in the canal.  The final stem was then seated and then we selected the size small head for trial reduction and this showed good soft tissue motion stability and soft tissue balance.  The trial CTA head was removed the final head was then impacted onto the implant.  A final reduction showed good motion stability and soft tissue balance considering the difficult situation.  The subscapularis was mobilized and repaired back to Thylox on the collar of the implant to assist with anterior soft tissue stability.  Irrigation was then completed.  Hemostasis was obtained.  The balance of the vancomycin powder was then sprayed liberally throughout the deep soft tissue planes.  The deltopectoral interval was reapproximated with a series of figure-of-eight number Vicryl sutures.  2-0 Monocryl used to close the subcu layer and intracuticular 3-0 Monocryl used to close the skin followed by Steri-Strips and a Aquacel dressing.  The right arm was placed into a sling.  The patient was awakened, extubated, and taken to the recovery room in stable condition.  Glo Larch MD   Contact # [phone]

## 2023-09-07 NOTE — Anesthesia Procedure Notes (Signed)
 Procedure Name: Intubation Date/Time: 09/07/2023 3:40 PM  Performed by: Alfreda Imus, CRNAPre-anesthesia Checklist: Emergency Drugs available, Suction available, Patient being monitored, Timeout performed and Patient identified Patient Re-evaluated:Patient Re-evaluated prior to induction Oxygen Delivery Method: Circle system utilized Preoxygenation: Pre-oxygenation with 100% oxygen Induction Type: IV induction Ventilation: Mask ventilation without difficulty Laryngoscope Size: Miller and 2 Grade View: Grade II Tube type: Oral Tube size: 6.0 mm Number of attempts: 1 Placement Confirmation: ETT inserted through vocal cords under direct vision, positive ETCO2, CO2 detector and breath sounds checked- equal and bilateral Secured at: 18 cm Tube secured with: Tape Dental Injury: Teeth and Oropharynx as per pre-operative assessment

## 2023-09-07 NOTE — Anesthesia Procedure Notes (Signed)
 Anesthesia Regional Block: Interscalene brachial plexus block   Pre-Anesthetic Checklist: , timeout performed,  Correct Patient, Correct Site, Correct Laterality,  Correct Procedure, Correct Position, site marked,  Risks and benefits discussed,  Surgical consent,  Pre-op evaluation,  At surgeon's request and post-op pain management  Laterality: Right  Prep: Maximum Sterile Barrier Precautions used, chloraprep       Needles:  Injection technique: Single-shot  Needle Type: Echogenic Stimulator Needle     Needle Length: 9cm  Needle Gauge: 22     Additional Needles:   Procedures:,,,, ultrasound used (permanent image in chart),,    Narrative:  Start time: 09/07/2023 2:30 PM End time: 09/07/2023 2:35 PM Injection made incrementally with aspirations every 5 mL.  Performed by: Personally  Anesthesiologist: Jacquelyne Matte, DO  Additional Notes: Monitors applied. No increased pain on injection. No increased resistance to injection. Injection made in 5cc increments. Good needle visualization. Patient tolerated procedure well.

## 2023-09-07 NOTE — Transfer of Care (Signed)
 Immediate Anesthesia Transfer of Care Note  Patient: Faith Massey  Procedure(s) Performed: REVISION, REVERSE TOTAL ARTHROPLASTY, SHOULDER (Right: Shoulder)  Patient Location: PACU  Anesthesia Type:General  Level of Consciousness: sedated  Airway & Oxygen Therapy: Patient Spontanous Breathing and Patient connected to face mask oxygen  Post-op Assessment: Report given to RN and Post -op Vital signs reviewed and stable  Post vital signs: Reviewed and stable  Last Vitals:  Vitals Value Taken Time  BP 99/58 09/07/23 17:22  Temp    Pulse 90 09/07/23 17:25  Resp 23 09/07/23 17:25  SpO2 99 % 09/07/23 17:25  Vitals shown include unfiled device data.  Last Pain:  Vitals:   09/07/23 1435  TempSrc:   PainSc: 0-No pain         Complications: No notable events documented.

## 2023-09-07 NOTE — Discharge Instructions (Signed)
 Faith Massey. Supple, M.D., F.A.A.O.S. Orthopaedic Surgery Specializing in Arthroscopic and Reconstructive Surgery of the Shoulder [phone] [address] [place], [place] [address] - Fax [phone]    POST-OP REVERSE TOTAL SHOULDER REPLACEMENT INSTRUCTIONS  1. Your first follow up appointment is:  2. The bandage over your incision is waterproof. You may begin showering with this dressing on immediately but do not submerge in a bath tub or hot tub. You may leave this dressing on until first follow up appointment in around 2 weeks. We prefer you leave this dressing in place until follow up however after 5-7 days if you are having itching or skin irritation and would like to remove it you may do so. Go slow and tug at the borders gently to break the bond the dressing has with the skin. At this point if there is no drainage it is okay to go without a bandage or you may cover it with a light gauze and tape. Leave your steri-strips across your incision. You can also expect significant bruising around your shoulder that will drift down your arm and into your chest wall. This is very normal and should resolve over several days. It is also very normal to have some swelling to the operative extremity that will involve the forearm and hand and fingers. It is recommended that if you notice this then try to elevate your hand and fist above your heart and perform gripping exercises several times an hour to help reduce this swelling (see #4).   3. Wear your sling if you are going to be up walking around and when you go to sleep at night. Also protect the arm in the sling until your nerve block has worn off. Then it is ok to remove the sling  to occasionally let your arm dangle by your side to stretch your elbow.   4. Range of motion to your elbow, wrist, and hand are encouraged 3-5 times daily. Exercise to your hand and fingers helps to reduce swelling you may experience. If you have a foam ball  you can use this. If not you can simply use a washcloth to squeeze.   5. Utilize the ice machine as much as possible over the first several days, but it is OK to remove to get up to walk around several times a day to stretch your legs and to go to the bathroom. When in the ice machine, please make sure there is a layer of clothing between the sleeve and your skin and check your skin frequently to make sure there is no redness or skin irritation, if you notice this then give your skin a break from the ice machine for several hours. After the first 3-4 days you can gradually reduce how much time you spend in the ice machine and adjust to your level of pain. In general it is a good rule of thumb that if you are experiencing pain you should be using the ice machine.  6. Prescriptions for a pain medication and a muscle relaxant are provided for you. It is recommended that if you are experiencing pain that your pain medication alone is not controlling, add the muscle relaxant along with the pain medication which can give additional pain relief. The first 1-2 days after your block has worn off is generally the most severe of your pain and then should gradually decrease. As your pain lessens it is recommended that you decrease your use of the pain medications to  an as needed basis' only and to always comply with the recommended dosages of the pain medications. It is also ok to utilize over the counter form of pain medicines such as acetaminophen  instead of the prescription pain medications if your pain is only mild. But do not use the prescription pain med and acetaminophen  at the same time, it is either one or the other, not both.  7. Pain medications can produce constipation along with their use. If you experience this, the use of an over the counter stool softener or laxative daily is recommended.   8. For additional questions or concerns, please do not hesitate to call the office. If after hours there is an  answering service to forward your concerns to the physician or physician assistant on call.  9. Pain control following an exparel  nerve block:  To help control your post-operative pain you received a nerve block  performed with Exparel  which is a long acting anesthetic (numbing agent) which can provide pain relief and sensations of numbness (and relief of pain) in the operative shoulder and arm for up to 3 days. Sometimes it provides mixed relief, meaning you may still have numbness in certain areas of the arm but can still be able to move parts of that arm, hand, and fingers. We recommend that your prescribed pain medications be used as needed. We do not feel it is necessary to pre medicate and stay ahead of pain.  Taking narcotic pain medications when you are not having any pain can lead to unnecessary and potentially dangerous side effects.  While the effects of the nerve block are present, please be aware that while you do not have sensation we recommend you pay careful attention to keep your arm positioned in a protective way. Also if you have a sling that has a "thumb loop" that helps to keep the sling from sliding backwards, make sure you remove the loop to avoid a constant pressure to the thumb which can cause some nerve damage or skin breakdown.  10. Most people find it more comfortable to sleep in a semi upright position or in a recliner with the arm supported. Again, we do recommend you wear your sling to sleep. It is also ok to try to sleep lying flat in bed with a pillow behind your arm to keep it from sliding or falling backwards. If you are trying to sleep on the non-operative side, please use pillows to position your arm so that it does not slide forwards or backwards. It is very common that sleeping and getting into a comfortable position is difficult after shoulder surgery, this should improve with time.  11. Dressing - It is recommended you wear shirts that are loose or button up the  front. To put shirt on allow operative arm to dangle by your side and slide the shirt on that arm, then your head and then the non-operative arm. To remove the shirt do this sequence in reverse. We recommend you wear the sling over top of your shirt to prevent skin irritation. If you notice irritation in your axilla (arm pit) please try to elevate arm away from your body to allow air to get in this area and consider use of an over the counter ointment such as hydrocortisone or simple lotion.  FOR ADDITIONAL INFO ON THE [employer] ICE MACHINE AND INSTRUCTIONS GO TO THE WEBSITE AT  [email or web address]   I  12.  We recommend that you avoid any dental work or cleaning in  the first 3 months following your joint replacement. This is to help minimize the possibility of infection from the bacteria in your mouth that enters your bloodstream during dental work. We also recommend that you take an antibiotic prior to your dental work for the first year after your shoulder replacement to further help reduce that risk. Please simply contact our office for antibiotics to be sent to your pharmacy prior to dental work.  13. Post Op Exercises: Please see attached sheet with diagram    NO EXRECISES YET

## 2023-09-07 NOTE — H&P (Signed)
 Faith Massey    Chief Complaint: FAILED RIGHT REVERSE SHOULDER ARTHROPLASTY HPI: The patient is a 78 y.o. female well-known to our practice status post a right shoulder reverse arthroplasty that we performed 08/18/2023.  At the time of the procedure she was found to have significant osteoporosis involving the glenoid with suboptimal fixation of the glenoid baseplate.  We were hopeful however that the combination of locking screw fixation and limited postop activities would allow for healing.  Unfortunately however at her first postop visit radiographs show that the glenosphere and baseplate had migrated with loss of fixation and she is now brought to the operating room for anticipated conversion to hemiarthroplasty.  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 Retta Caster   Hoarseness of voice 05/09/2023   HTN (hypertension) 03/28/2022   Hyperlipidemia    Hypertension    Laceration of muscle, fascia and tendon of long head of biceps, unspecified arm, initial encounter    right arm  involving rotator  cuff   Myasthenia gravis (HCC)    Dx'd 03/2022   Myasthenic syndrome (HCC) 04/12/2022   Osteoporosis 10/12/2012   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 10/16/2012   Pneumonia    Pre-diabetes    Prediabetes 05/25/2022   Pyelonephritis 11/10/2022   Right knee injury 02/14/2017   S/P radiation therapy 05/09/2023   S/P Robotic Assisted Right Video Thoracoscopy with resection of Thymus 04/25/2022   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      Past Surgical History:  Procedure Laterality Date   ABDOMINAL HYSTERECTOMY  03/22/2003   BACK SURGERY  03/21/1998   BREAST SURGERY     reconstruction   colon  susrgery      CYSTOSCOPY WITH STENT PLACEMENT N/A 02/08/2023   Procedure: POSSIBLE CYSTOSCOPY WITH URETERAL STENT PLACEMENT;  Surgeon: Melody Spurling., MD;  Location: [facility];  Service: Urology;  Laterality: N/A;   LEFT HEART CATH AND CORONARY ANGIOGRAPHY N/A 07/20/2023   Procedure: LEFT HEART CATH AND CORONARY ANGIOGRAPHY;  Surgeon: Odie Benne, MD;  Location: [facility];  Service: Cardiovascular;  Laterality: N/A;   MASTECTOMY Bilateral 03/21/1993   RESECTION OF A THYMOMA     04-25-22   right total shoulder      Family History  Problem Relation Age of Onset   Stroke Mother    Hypertension Mother    Myasthenia gravis Mother    Hypertension Father    Colon cancer Neg Hx    Esophageal cancer Neg Hx     Social History:  reports that she has never smoked. She has never been exposed to tobacco smoke. She has never used smokeless tobacco. She reports that she does not currently use alcohol after a past usage of about 1.0 standard drink of alcohol per week. She reports that she does not use drugs.  BMI: Estimated body mass index is 22.22 kg/m as calculated from the following:   Height as of 09/06/23: 4' 11 (1.499 m).   Weight as of 09/06/23: 49.9 kg.  Lab Results  Component Value Date   ALBUMIN  4.5 08/10/2023   Diabetes: Patient does not have a diagnosis of diabetes. Lab Results  Component Value Date   HGBA1C 5.4 07/20/2023     Smoking Status:   reports that she has never smoked. She has never been exposed to tobacco smoke. She has never used smokeless tobacco.     No medications prior to admission.     Physical Exam: Inspection of the right shoulder demonstrates that the surgical incision is overall healing well without erythema or drainage.  He remains neurovascular intact in the right upper extremity.  Compartments are soft.  She does have moderate pain with passive motion.  Radiographs  Plain films of the right shoulder confirm displacement of  the glenosphere and baseplate with loss of fixation.  Vitals  Weight:  [49.9 kg] 49.9 kg (06/18 1526)  Assessment/Plan  Impression: FAILED RIGHT REVERSE SHOULDER ARTHROPLASTY  Plan of Action: Procedure(s): REVISION, REVERSE TOTAL ARTHROPLASTY, SHOULDER with anticipated conversion to hemiarthroplasty and possible bone grafting of the glenoid  Darion Juhasz M Derek Laughter 09/07/2023, 9:37 AM Contact # [phone]

## 2023-09-07 NOTE — Anesthesia Postprocedure Evaluation (Signed)
 Anesthesia Post Note  Patient: Faith Massey  Procedure(s) Performed: REVISION, REVERSE TOTAL ARTHROPLASTY, SHOULDER (Right: Shoulder)     Patient location during evaluation: PACU Anesthesia Type: General and Regional Level of consciousness: awake and alert, oriented and patient cooperative Pain management: pain level controlled Vital Signs Assessment: post-procedure vital signs reviewed and stable Respiratory status: spontaneous breathing, nonlabored ventilation and respiratory function stable Cardiovascular status: blood pressure returned to baseline and stable Postop Assessment: no apparent nausea or vomiting Anesthetic complications: no   No notable events documented.  Last Vitals:  Vitals:   09/07/23 1730 09/07/23 1745  BP: 100/74 92/81  Pulse: 85 77  Resp: 20 20  Temp:    SpO2: 99% 94%    Last Pain:  Vitals:   09/07/23 1722  TempSrc:   PainSc: 0-No pain                 Jacquelyne Matte

## 2023-09-08 ENCOUNTER — Encounter (HOSPITAL_COMMUNITY): Payer: Self-pay | Admitting: Orthopedic Surgery

## 2023-09-09 NOTE — Discharge Summary (Signed)
 PATIENT ID:      Faith Massey  MRN:     [id number] DOB/AGE:    76-Nov-1947 / 76 y.o.     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 10/16/2012   Pneumonia    Pre-diabetes    Prediabetes 05/25/2022   Pyelonephritis 11/10/2022   Right knee injury 02/14/2017   S/P radiation therapy 05/09/2023   S/P Robotic Assisted Right Video Thoracoscopy with resection of Thymus 04/25/2022   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  HOSPITAL COURSE:  Faith Massey is a 78 y.o. admitted on 09/07/2023 with a diagnosis of FAILED RIGHT REVERSE SHOULDER ARTHROPLASTY.  They were brought to the operating room on 09/07/2023 and underwent Procedure(s): REVISION, REVERSE TOTAL ARTHROPLASTY, SHOULDER.    They were given perioperative antibiotics:  Anti-infectives (From admission, onward)    Start     Dose/Rate Route Frequency Ordered Stop   09/08/23 0600  ceFAZolin  (ANCEF )  IVPB 2g/100 mL premix        2 g 200 mL/hr over 30 Minutes Intravenous On call to O.R. 09/07/23 1303 09/07/23 1612   09/07/23 1607  vancomycin  (VANCOCIN ) powder  Status:  Discontinued          As needed 09/07/23 1607 09/08/23 0003     .  Patient underwent the above named procedure and tolerated it well. The following day they were hemodynamically stable and pain was controlled on oral analgesics. They were neurovascularly intact to the operative extremity. OT was ordered and worked with patient per protocol. They were medically and orthopaedically stable for discharge on 09/07/2023.    DIAGNOSTIC STUDIES:  RECENT RADIOGRAPHIC STUDIES :  No results found.  RECENT VITAL SIGNS:  No data found.Faith Massey  RECENT EKG RESULTS:    Orders placed or performed in visit on 07/28/23   EKG 12-Lead    DISCHARGE INSTRUCTIONS:  Discharge Instructions     Increase activity slowly   Complete by: As directed        DISCHARGE MEDICATIONS:   Allergies as of 09/07/2023       Reactions   Prednisone  Shortness Of Breath, Swelling, Palpitations, Dermatitis, Hypertension    Reports medication caused swelling and metallic taste in mouth.        Medication List     TAKE these medications    acetaminophen  650 MG CR tablet Commonly known as: TYLENOL  Take 1,300 mg by  mouth every 8 (eight) hours as needed for pain.   apixaban  2.5 MG Tabs tablet Commonly known as: Eliquis  Take 1 tablet (2.5 mg total) by mouth 2 (two) times daily.   BLACK ELDERBERRY PO Take 1 capsule by mouth daily as needed (immune support).   COLLAGEN PO Take 1 Scoop by mouth daily.   lisinopril  20 MG tablet Commonly known as: ZESTRIL  Take 1.5 tablets (30 mg total) by mouth daily. Take one tablet and a half, total of 30 mg daily.   Melatonin 10 MG Caps Take 10 mg by mouth at bedtime.   multivitamin with minerals tablet Take 1 tablet by mouth daily.   Octagam 20 GM/200ML Soln Generic drug: Immune Globulin 10% Inject 50  g into the vein. 03/29/2023 Takes 50 grams for 2 days every 3 weeks.   Octagam 5 GM/50ML Soln Generic drug: Immune Globulin 10%   OVER THE COUNTER MEDICATION Take 1 tablet by mouth in the morning and at bedtime. Candidase   OVER THE COUNTER MEDICATION Take 1 tablet by mouth daily. Betaglutamine   oxyCODONE -acetaminophen  5-325 MG tablet Commonly known as: PERCOCET/ROXICET Take 1 tablet by mouth 4 (four) times daily as needed for severe pain (pain score 7-10).   PROBIOTIC PO Take 1-2 capsules by mouth daily.   pyridostigmine  60 MG tablet Commonly known as: Mestinon  Take 1 tablet (60 mg total) by mouth 4 (four) times daily as needed.   tiZANidine  4 MG tablet Commonly known as: Zanaflex  Take 1 tablet (4 mg total) by mouth daily.   traMADol  50 MG tablet Commonly known as: ULTRAM  Take 1 tablet (50 mg total) by mouth every 6 (six) hours as needed.   UNABLE TO FIND Take 0.25 tablets by mouth at bedtime. Med Name: A quarter of a CBD gummy daily at bedtime.   Vitamin D-3 125 MCG (5000 UT) Tabs Take 5,000 Units by mouth daily.        FOLLOW UP VISIT:    Follow-up Information     Faith Drivers, MD Follow up.   Specialty: Orthopedic Surgery Why: 09-18-2023 at 11:00 AM for post-op Contact information: [address] [place] [address] [phone]                 DISCHARGE TO: Home from PACU  DISCHARGE CONDITION:  Faith Massey for Dr. Drivers Faith 09/09/2023, 8:07 AM

## 2023-09-18 ENCOUNTER — Ambulatory Visit: Admitting: Physical Therapy

## 2023-09-18 DIAGNOSIS — Z96611 Presence of right artificial shoulder joint: Secondary | ICD-10-CM | POA: Diagnosis not present

## 2023-09-20 ENCOUNTER — Ambulatory Visit: Attending: Orthopedic Surgery | Admitting: Physical Therapy

## 2023-09-20 NOTE — Therapy (Deleted)
 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 10/16/2012   Pneumonia    Pre-diabetes    Prediabetes 05/25/2022   Pyelonephritis 11/10/2022   Right knee injury 02/14/2017   S/P radiation therapy 05/09/2023   S/P Robotic Assisted Right Video Thoracoscopy with resection of Thymus 04/25/2022   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   Past Surgical History:  Procedure Laterality Date   ABDOMINAL HYSTERECTOMY  03/22/2003   BACK SURGERY  03/21/1998   BREAST SURGERY     reconstruction   colon susrgery      CYSTOSCOPY WITH STENT PLACEMENT N/A 02/08/2023   Procedure: POSSIBLE CYSTOSCOPY WITH URETERAL STENT PLACEMENT;  Surgeon: Alvaro Ricardo KATHEE Mickey., MD;  Location: [facility];  Service: Urology;  Laterality: N/A;   LEFT HEART CATH AND CORONARY ANGIOGRAPHY N/A 07/20/2023   Procedure: LEFT HEART CATH AND CORONARY ANGIOGRAPHY;  Surgeon: Verlin Lonni BIRCH, MD;  Location: [facility];  Service: Cardiovascular;  Laterality: N/A;   MASTECTOMY Bilateral 03/21/1993   RESECTION OF A THYMOMA     04-25-22   REVISION TOTAL SHOULDER TO REVERSE TOTAL  SHOULDER Right 09/07/2023   Procedure: REVISION, REVERSE TOTAL ARTHROPLASTY, SHOULDER;  Surgeon: Melita Drivers, MD;  Location: [facility];  Service: Orthopedics;  Laterality: Right;  HEMI ARTHROPLASTY, BONEGRAFT GLENOID   right total shoulder     Patient Active Problem List   Diagnosis Date Noted   S/P shoulder hemiarthroplasty, 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   Abnormal EKG 07/21/2023   Chest pain 07/20/2023   Preoperative cardiovascular examination 07/19/2023   Arthritis    Breast CA (HCC)    Hyperlipidemia    Laceration of muscle, fascia and tendon of long head of biceps, unspecified arm, initial encounter    Myasthenia gravis (HCC)    Pneumonia    S/P radiation therapy 05/09/2023   Hoarseness of voice 05/09/2023   Diverticular disease 02/08/2023   History of pulmonary embolism 12/22/2022   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   Pyelonephritis 11/10/2022   Prediabetes 05/25/2022   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  Rationale for Evaluation and  Treatment: {HABREHAB:27488}  ONSET DATE: ***  SUBJECTIVE:                                                                                                                                                                                      SUBJECTIVE STATEMENT: *** Hand dominance: {MISC; OT HAND DOMINANCE:[phone]}  PERTINENT HISTORY: From Dr. Rosea note: The patient is a 78 y.o. female well-known to our practice status post a right shoulder  reverse arthroplasty that we performed 08/18/2023 Revision right shoulder reverse arthroplasty with conversion to hemiarthroplasty utilizing a size small Arthrex CTA head, press-fit size 6 Arthrex stem  PAIN:  Are you having pain? {OPRCPAIN:27236}  PRECAUTIONS: {Therapy precautions:24002}  RED FLAGS: {PT Red Flags:29287}   WEIGHT BEARING RESTRICTIONS: {Yes ***/No:24003}  FALLS:  Has patient fallen in last 6 months? {fallsyesno:27318}  LIVING ENVIRONMENT: Lives with: {OPRC lives with:25569::lives with their family} Lives in: {Lives in:25570} Stairs: {opstairs:27293} Has following equipment at home: {Assistive devices:23999}  OCCUPATION: ***  PLOF: {PLOF:24004}  PATIENT GOALS:***  NEXT MD VISIT:   OBJECTIVE:  Note: Objective measures were completed at Evaluation unless otherwise noted.  DIAGNOSTIC FINDINGS:  ***  PATIENT SURVEYS:  {rehab surveys:24030:a}  COGNITION: Overall cognitive status: {cognition:24006}     SENSATION: {sensation:27233}  POSTURE: ***  UPPER EXTREMITY ROM:   {AROM/PROM:27142} ROM Right eval Left eval  Shoulder flexion    Shoulder extension    Shoulder abduction    Shoulder adduction    Shoulder internal rotation    Shoulder external rotation    Elbow flexion    Elbow extension    Wrist flexion    Wrist extension    Wrist ulnar deviation    Wrist radial deviation    Wrist pronation    Wrist supination    (Blank rows = not tested)  UPPER EXTREMITY MMT:  MMT Right eval Left eval  Shoulder flexion    Shoulder extension    Shoulder abduction    Shoulder adduction    Shoulder internal rotation    Shoulder external rotation    Middle trapezius    Lower trapezius    Elbow flexion    Elbow extension    Wrist flexion    Wrist extension    Wrist ulnar deviation    Wrist radial deviation    Wrist pronation    Wrist supination    Grip strength (lbs)    (Blank rows = not tested)  SHOULDER SPECIAL TESTS: Impingement  tests: {shoulder impingement test:25231:a} SLAP lesions: {SLAP lesions:25232} Instability tests: {shoulder instability test:25233} Rotator cuff assessment: {rotator cuff assessment:25234} Biceps assessment: {biceps assessment:25235}  JOINT MOBILITY TESTING:  ***  PALPATION:  ***  TREATMENT DATE: ***   PATIENT EDUCATION: Education details: *** Person educated: {Person educated:25204} Education method: {Education Method:25205} Education comprehension: {Education Comprehension:25206}  HOME EXERCISE PROGRAM: ***  ASSESSMENT:  CLINICAL IMPRESSION: Patient is a *** y.o. *** who was seen today for physical therapy evaluation and treatment for ***.   OBJECTIVE IMPAIRMENTS: {opptimpairments:25111}.   ACTIVITY LIMITATIONS: {activitylimitations:27494}  PARTICIPATION LIMITATIONS: {participationrestrictions:25113}  PERSONAL FACTORS: {Personal factors:25162} are also affecting patient's functional outcome.   REHAB POTENTIAL: {rehabpotential:25112}  CLINICAL DECISION MAKING: {clinical decision making:25114}  EVALUATION COMPLEXITY: {Evaluation complexity:25115}   GOALS: Goals reviewed with patient? {yes/no:20286}  SHORT TERM GOALS: Target date: ***  *** Baseline: Goal status: INITIAL  2.  *** Baseline:  Goal status: INITIAL  3.  *** Baseline:  Goal status: INITIAL  4.  *** Baseline:  Goal status: INITIAL  5.  *** Baseline:  Goal status: INITIAL  6.  *** Baseline:  Goal status: INITIAL  LONG TERM GOALS: Target date: ***  *** Baseline:  Goal status: INITIAL  2.  *** Baseline:  Goal status: INITIAL  3.  *** Baseline:  Goal status: INITIAL  4.  *** Baseline:  Goal status: INITIAL  5.  *** Baseline:  Goal status: INITIAL  6.  *** Baseline:  Goal status: INITIAL  PLAN:  PT FREQUENCY: {rehab frequency:25116}  PT  DURATION: {rehab duration:25117}  PLANNED INTERVENTIONS: {rehab planned interventions:25118::97110-Therapeutic exercises,97530- Therapeutic [phone]- Neuromuscular re-education,97535- Self Rjmz,02859- Manual therapy}  PLAN FOR NEXT SESSION: ***   Michayla Mcneil April Ma L Mally Gavina, PT 09/20/2023, 10:58 AM

## 2023-09-21 DIAGNOSIS — G709 Myoneural disorder, unspecified: Secondary | ICD-10-CM | POA: Diagnosis not present

## 2023-09-21 DIAGNOSIS — G7001 Myasthenia gravis with (acute) exacerbation: Secondary | ICD-10-CM | POA: Diagnosis not present

## 2023-09-22 DIAGNOSIS — G7001 Myasthenia gravis with (acute) exacerbation: Secondary | ICD-10-CM | POA: Diagnosis not present

## 2023-09-25 ENCOUNTER — Encounter

## 2023-09-26 ENCOUNTER — Encounter: Payer: Self-pay | Admitting: Family Medicine

## 2023-09-27 ENCOUNTER — Ambulatory Visit: Payer: Medicare HMO | Admitting: Family Medicine

## 2023-09-27 ENCOUNTER — Encounter

## 2023-10-02 ENCOUNTER — Encounter

## 2023-10-02 ENCOUNTER — Ambulatory Visit: Admitting: Family Medicine

## 2023-10-04 ENCOUNTER — Encounter

## 2023-10-06 NOTE — Progress Notes (Addendum)
 [facility] [address] [place], [place] [address] [phone]  Date:  10/09/2023   Name:  Faith Massey   DOB:  April 08, 1945   MRN:  [id number]  PCP:  Faith Harlene BROCKS, MD    Chief Complaint: No chief complaint on file.   History of Present Illness:  Faith Massey is a 78 y.o. very pleasant female patient who presents with the following:  Pt seen today with concern of back pain Last seen by myself in May  History of hypertension, CVA, thymoma, myasthenia gravis, remote breast cancer 1995, hyperlipidemia, osteoporosis, pre-diabetes, pulmonary embolism March 2024 Earlier this summer she had concern of air coming from her bladder.  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  She reached out to us  last week via mychart: I am having pain in my the area of my kidneys and it's been going on for several months now with no relief.  Also, as you may already know, I did have to have a second surgery for my shoulder on June 19th, but I don't think that has anything to do with my back/kidney  pain   Recommend pneumonia vaccine-declines Recommend tetanus booster-declines Shingrix-declines Dexa can be updated- ordered toay ?? Mammogram -s/p bilateral mastectomy so not needed  She had shoulder surgery on May 30th per Dr Melita; she had a right shoulder replacement She was back in the OR to repair a complication on 6/19; patient reports her scapula cracked which necessitated a repeat procedure. It seems to be doing much better per his note from 6/30-patient also notes her pain is much improved I have counseled [name] regarding today's findings. She looks much more comfortable and reports that she is feeling much better than after the first surgery. I would like for her to continue with limited use of the right arm for another several weeks. She may use the hand to waist  level as she is comfortable. Will plan to see her back in 1 month at that time we will consider a course of formal therapy. We have discussed that with the current construct that it is difficult to predict how much function she may ultimately regain. Thankfully she is feeling much better after her recent surgery.   She notes pain in her bilateral mid to lower back for about 5-6 months Will wax and wane She had more pain in her back when she was dealing with her recent shoulder surgery She did have L3/4/5 surgery years ago- does not normally have pain this high It does not seem to be in the spine but laterally- she worries it could be due to her kidneys   She weaned off her oxycodone  and tramadol  since her shoulder surgery She is using some sort of homeopathic remedy now and will sometimes use tylenol   No change in her urinary pattern, no hematuria   Admits she is taking a good amount of vitamin D  in hopes of accelerating her bone healing.  She is not sure how much she should be taking-she reports being told her vitamin D  was low in the past   Wt Readings from Last 3 Encounters:  10/09/23 107 lb (48.5 kg)  09/07/23 110 lb (49.9 kg)  09/06/23 110 lb (49.9 kg)    Patient Active Problem List   Diagnosis Date Noted   S/P shoulder hemiarthroplasty, 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   Abnormal EKG  07/21/2023   Chest pain 07/20/2023   Preoperative cardiovascular examination 07/19/2023   Arthritis    Breast CA (HCC)    Hyperlipidemia    Laceration of muscle, fascia and tendon of long head of biceps, unspecified arm, initial encounter    Myasthenia gravis (HCC)    Pneumonia    S/P radiation therapy 05/09/2023   Hoarseness of voice 05/09/2023   Diverticular disease 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   History of pulmonary embolism 12/22/2022   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   Pyelonephritis 11/10/2022   Prediabetes 05/25/2022   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   S/P Robotic Assisted Right Video Thoracoscopy with resection of  Thymus 04/25/2022   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 10/16/2012   Pneumonia    Pre-diabetes    Prediabetes 05/25/2022   Pyelonephritis 11/10/2022   Right knee injury 02/14/2017   S/P radiation therapy 05/09/2023   S/P Robotic Assisted Right Video Thoracoscopy with resection of Thymus 04/25/2022   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    Past Surgical History:  Procedure Laterality Date   ABDOMINAL HYSTERECTOMY  03/22/2003   BACK SURGERY  03/21/1998   BREAST SURGERY     reconstruction   colon susrgery      CYSTOSCOPY WITH STENT PLACEMENT N/A 02/08/2023   Procedure: POSSIBLE CYSTOSCOPY WITH URETERAL STENT PLACEMENT;  Surgeon: Alvaro Ricardo KATHEE Mickey., MD;  Location: [facility];  Service: Urology;  Laterality: N/A;   LEFT HEART CATH AND CORONARY ANGIOGRAPHY N/A 07/20/2023   Procedure: LEFT HEART CATH AND CORONARY ANGIOGRAPHY;  Surgeon: Verlin Lonni BIRCH, MD;  Location: [facility];  Service:  Cardiovascular;  Laterality: N/A;   MASTECTOMY Bilateral 03/21/1993   RESECTION OF A THYMOMA     04-25-22   REVISION TOTAL SHOULDER TO REVERSE TOTAL SHOULDER Right 09/07/2023   Procedure: REVISION, REVERSE TOTAL ARTHROPLASTY, SHOULDER;  Surgeon: Melita Drivers, MD;  Location: [facility];  Service: Orthopedics;  Laterality: Right;  HEMI ARTHROPLASTY, BONEGRAFT GLENOID   right total shoulder      Social History   Tobacco Use   Smoking status: Never    Passive exposure: Never   Smokeless tobacco: Never  Vaping Use   Vaping status: Never Used  Substance Use Topics   Alcohol use: Not Currently    Alcohol/week: 1.0 standard drink of alcohol    Types: 1 Glasses of wine per week    Comment: occasional   very rare  Drug use: No    Family History  Problem Relation Age of Onset   Stroke Mother    Hypertension Mother    Myasthenia gravis Mother    Hypertension Father    Colon cancer Neg Hx    Esophageal cancer Neg Hx     Allergies  Allergen Reactions   Prednisone  Shortness Of Breath, Swelling, Palpitations, Dermatitis and Hypertension     Reports medication caused swelling and metallic taste in mouth.    Medication list has been reviewed and updated.  Current Outpatient Medications on File Prior to Visit  Medication Sig Dispense Refill   acetaminophen  (TYLENOL ) 650 MG CR tablet Take 1,300 mg by mouth every 8 (eight) hours as needed for pain.     apixaban  (ELIQUIS ) 2.5 MG TABS tablet Take 1 tablet (2.5 mg total) by mouth 2 (two) times daily.     BLACK ELDERBERRY PO Take 1 capsule by mouth daily as needed (immune support).     Cholecalciferol (VITAMIN D -3) 5000 UNITS TABS Take 5,000 Units by mouth daily.     COLLAGEN PO Take 1 Scoop by mouth daily.     lisinopril  (ZESTRIL ) 20 MG tablet Take 1.5 tablets (30 mg total) by mouth daily. Take one tablet and a half, total of 30 mg daily. 135 tablet 1   Melatonin 10 MG CAPS Take 10 mg by mouth at bedtime.     Multiple Vitamins-Minerals  (MULTIVITAMIN WITH MINERALS) tablet Take 1 tablet by mouth daily.     OCTAGAM 20 GM/200ML SOLN Inject 50 g into the vein. 03/29/2023 Takes 50 grams for 2 days every 3 weeks.     OCTAGAM 5 GM/50ML SOLN      OVER THE COUNTER MEDICATION Take 1 tablet by mouth in the morning and at bedtime. Candidase     OVER THE COUNTER MEDICATION Take 1 tablet by mouth daily. Betaglutamine     oxyCODONE -acetaminophen  (PERCOCET/ROXICET) 5-325 MG tablet Take 1 tablet by mouth 4 (four) times daily as needed for severe pain (pain score 7-10). 30 tablet 0   Probiotic Product (PROBIOTIC PO) Take 1-2 capsules by mouth daily.     pyridostigmine  (MESTINON ) 60 MG tablet Take 1 tablet (60 mg total) by mouth 4 (four) times daily as needed. 90 tablet 6   traMADol  (ULTRAM ) 50 MG tablet Take 1 tablet (50 mg total) by mouth every 6 (six) hours as needed. 20 tablet 0   UNABLE TO FIND Take 0.25 tablets by mouth at bedtime. Med Name: A quarter of a CBD gummy daily at bedtime.     No current facility-administered medications on file prior to visit.    Review of Systems:  As per HPI- otherwise negative.   Physical Examination: Vitals:   10/09/23 1257  BP: 101/72  Pulse: 98  Resp: 16  Temp: 98 F (36.7 C)  SpO2: 96%   Vitals:   10/09/23 1257  Weight: 107 lb (48.5 kg)  Height: 4' 11 (1.499 m)   Body mass index is 21.61 kg/m. Ideal Body Weight: Weight in (lb) to have BMI = 25: 123.5  GEN: no acute distress.  Petite build, looks well  HEENT: Atraumatic, Normocephalic.  Ears and Nose: No external deformity. CV: RRR, No M/G/R. No JVD. No thrill. No extra heart sounds. PULM: CTA B, no wheezes, crackles, rhonchi. No retractions. No resp. distress. No accessory muscle use. ABD: S, NT, ND, +BS. No rebound. No HSM. EXTR: No c/c/e PSYCH: Normally interactive. Conversant.  Patient notes mild reproducible tenderness over her  inferior posterior ribs bilaterally.  No bony spine tenderness to palpation.  Abdomen is  benign.  Assessment and Plan: Rib pain - Plan: DG Ribs Bilateral W/Chest  Discogenic thoracic pain - Plan: DG Thoracic Spine 2 View  Mid back pain - Plan: DG Thoracic Spine 2 View, POCT urinalysis dipstick, Basic metabolic panel with GFR  Estrogen deficiency - Plan: DG Bone Density  Vitamin D  deficiency - Plan: VITAMIN D  25 Hydroxy (Vit-D Deficiency, Fractures)  Patient seen today with a couple of concerns.  She is concerned about back pain, and exam this may actually be more pain over her posterior ribs versus thoracic muscular pain She is worried of her kidneys, we will check a dipstick and basic metabolic profile In any case we decided to obtain some x-rays for her today.  I will be in touch with these results ASAP Ordered bone density  Check vitamin D  level today   Signed Harlene Schroeder, MD Received her UA and also a critical vitamin D -mildly elevated at 104 CRITICAL VALUE STICKER   CRITICAL VALUE: Vitamin D  104.78     Will send a message to patient with her results so far as well as x-rays  Results for orders placed or performed in visit on 10/09/23  POCT urinalysis dipstick   Collection Time: 10/09/23  1:41 PM  Result Value Ref Range   Color, UA yellow yellow   Clarity, UA clear clear   Glucose, UA negative negative mg/dL   Bilirubin, UA negative negative   Ketones, POC UA trace (5) (A) negative mg/dL   Spec Grav, UA 8.989 8.989 - 1.025   Blood, UA negative negative   pH, UA 5.0 5.0 - 8.0   Protein Ur, POC negative negative mg/dL   Urobilinogen, UA 0.2 0.2 or 1.0 E.U./dL   Nitrite, UA Negative Negative   Leukocytes, UA Trace (A) Negative   DG Thoracic Spine 2 View Result Date: 10/09/2023 CLINICAL DATA:  Rib pain.  No known injury back pain. EXAM: THORACIC SPINE 2 VIEWS COMPARISON:  None Available. FINDINGS: Mild sigmoid scoliosis. No acute loss vertebral body height or disc height. There is endplate sclerosis at multiple levels. IMPRESSION: 1. No acute findings  thoracic spine. 2. Mild scoliosis. Electronically Signed   By: Jackquline Boxer M.D.   On: 10/09/2023 14:57   DG Ribs Bilateral W/Chest Result Date: 10/09/2023 CLINICAL DATA:  Pain ribs. EXAM: BILATERAL RIBS AND CHEST - 4+ VIEW COMPARISON:  None Available. FINDINGS: No fracture or other bone lesions are seen involving the ribs. There is no evidence of pneumothorax or pleural effusion. Both lungs are clear. Heart size and mediastinal contours are within normal limits. IMPRESSION: No rib fracture.  No acute cardiopulmonary Electronically Signed   By: Jackquline Boxer M.D.   On: 10/09/2023 14:20   Received additional labs, message to patient  Results for orders placed or performed in visit on 10/09/23  Basic metabolic panel with GFR   Collection Time: 10/09/23  1:32 PM  Result Value Ref Range   Sodium 137 135 - 145 mEq/L   Potassium 4.9 3.5 - 5.1 mEq/L   Chloride 103 96 - 112 mEq/L   CO2 25 19 - 32 mEq/L   Glucose, Bld 92 70 - 99 mg/dL   BUN 32 (H) 6 - 23 mg/dL   Creatinine, Ser 9.23 0.40 - 1.20 mg/dL   GFR 23.68 >39.99 mL/min   Calcium  9.5 8.4 - 10.5 mg/dL  VITAMIN D  25 Hydroxy (Vit-D Deficiency, Fractures)   Collection Time:  10/09/23  1:32 PM  Result Value Ref Range   VITD 104.78 (HH) 30.00 - 100.00 ng/mL  POCT urinalysis dipstick   Collection Time: 10/09/23  1:41 PM  Result Value Ref Range   Color, UA yellow yellow   Clarity, UA clear clear   Glucose, UA negative negative mg/dL   Bilirubin, UA negative negative   Ketones, POC UA trace (5) (A) negative mg/dL   Spec Grav, UA 8.989 8.989 - 1.025   Blood, UA negative negative   pH, UA 5.0 5.0 - 8.0   Protein Ur, POC negative negative mg/dL   Urobilinogen, UA 0.2 0.2 or 1.0 E.U./dL   Nitrite, UA Negative Negative   Leukocytes, UA Trace (A) Negative

## 2023-10-09 ENCOUNTER — Encounter: Payer: Self-pay | Admitting: Family Medicine

## 2023-10-09 ENCOUNTER — Encounter

## 2023-10-09 ENCOUNTER — Telehealth: Payer: Self-pay

## 2023-10-09 ENCOUNTER — Ambulatory Visit (HOSPITAL_BASED_OUTPATIENT_CLINIC_OR_DEPARTMENT_OTHER)
Admission: RE | Admit: 2023-10-09 | Discharge: 2023-10-09 | Disposition: A | Discharge status: Home / Self Care | Source: Ambulatory Visit | Attending: Family Medicine | Admitting: Family Medicine

## 2023-10-09 ENCOUNTER — Ambulatory Visit (INDEPENDENT_AMBULATORY_CARE_PROVIDER_SITE_OTHER): Admitting: Family Medicine

## 2023-10-09 VITALS — BP 101/72 | HR 98 | Temp 98.0°F | Resp 16 | Ht 59.0 in | Wt 107.0 lb

## 2023-10-09 DIAGNOSIS — E559 Vitamin D deficiency, unspecified: Secondary | ICD-10-CM

## 2023-10-09 DIAGNOSIS — M5134 Other intervertebral disc degeneration, thoracic region: Secondary | ICD-10-CM | POA: Insufficient documentation

## 2023-10-09 DIAGNOSIS — M549 Dorsalgia, unspecified: Secondary | ICD-10-CM

## 2023-10-09 DIAGNOSIS — R0781 Pleurodynia: Secondary | ICD-10-CM

## 2023-10-09 DIAGNOSIS — M419 Scoliosis, unspecified: Secondary | ICD-10-CM | POA: Diagnosis not present

## 2023-10-09 DIAGNOSIS — E2839 Other primary ovarian failure: Secondary | ICD-10-CM | POA: Diagnosis not present

## 2023-10-09 LAB — POCT URINALYSIS DIP (MANUAL ENTRY)
Bilirubin, UA: NEGATIVE
Blood, UA: NEGATIVE
Glucose, UA: NEGATIVE mg/dL
Nitrite, UA: NEGATIVE
Protein Ur, POC: NEGATIVE mg/dL
Spec Grav, UA: 1.01 (ref 1.010–1.025)
Urobilinogen, UA: 0.2 U/dL
pH, UA: 5 (ref 5.0–8.0)

## 2023-10-09 LAB — BASIC METABOLIC PANEL WITH GFR
BUN: 32 mg/dL — ABNORMAL HIGH (ref 6–23)
CO2: 25 meq/L (ref 19–32)
Calcium: 9.5 mg/dL (ref 8.4–10.5)
Chloride: 103 meq/L (ref 96–112)
Creatinine, Ser: 0.76 mg/dL (ref 0.40–1.20)
GFR: 76.31 mL/min (ref >60.00)
Glucose, Bld: 92 mg/dL (ref 70–99)
Potassium: 4.9 meq/L (ref 3.5–5.1)
Sodium: 137 meq/L (ref 135–145)

## 2023-10-09 LAB — VITAMIN D 25 HYDROXY (VIT D DEFICIENCY, FRACTURES): VITD: 104.78 ng/mL (ref 30.00–100.00)

## 2023-10-09 NOTE — Patient Instructions (Addendum)
 Good to see you again today- I will be in touch with your labs and x-rays asap We will check on your vitamin D  level  Please let me know if anything should change  I would recommend getting a pneumonia, tetanus vaccine and the shingles vaccine series - these can all be done at your pharmacy if you like!

## 2023-10-09 NOTE — Telephone Encounter (Signed)
 CRITICAL VALUE STICKER  CRITICAL VALUE: Vitamin D  104.78

## 2023-10-11 ENCOUNTER — Encounter

## 2023-10-11 ENCOUNTER — Ambulatory Visit (HOSPITAL_BASED_OUTPATIENT_CLINIC_OR_DEPARTMENT_OTHER)
Admission: RE | Admit: 2023-10-11 | Discharge: 2023-10-11 | Disposition: A | Discharge status: Home / Self Care | Source: Ambulatory Visit | Attending: Family Medicine | Admitting: Family Medicine

## 2023-10-11 ENCOUNTER — Encounter: Payer: Self-pay | Admitting: Family Medicine

## 2023-10-11 DIAGNOSIS — E2839 Other primary ovarian failure: Secondary | ICD-10-CM | POA: Insufficient documentation

## 2023-10-12 DIAGNOSIS — G7001 Myasthenia gravis with (acute) exacerbation: Secondary | ICD-10-CM | POA: Diagnosis not present

## 2023-10-12 DIAGNOSIS — G709 Myoneural disorder, unspecified: Secondary | ICD-10-CM | POA: Diagnosis not present

## 2023-10-13 DIAGNOSIS — G709 Myoneural disorder, unspecified: Secondary | ICD-10-CM | POA: Diagnosis not present

## 2023-10-13 DIAGNOSIS — G7001 Myasthenia gravis with (acute) exacerbation: Secondary | ICD-10-CM | POA: Diagnosis not present

## 2023-10-18 DIAGNOSIS — Z96611 Presence of right artificial shoulder joint: Secondary | ICD-10-CM | POA: Diagnosis not present

## 2023-10-26 NOTE — Therapy (Signed)
 OUTPATIENT PHYSICAL THERAPY UPPER EXTREMITY EVALUATION   Patient Name: Faith Massey MRN: [id number] DOB:08-25-1945, 78 y.o., female Today's Date: 10/30/2023  END OF SESSION:  PT End of Session - 10/30/23 1431     Visit Number 1    Date for PT Re-Evaluation 01/22/24    Authorization Type [employer]    Progress Note Due on Visit 10    PT Start Time 1439    PT Stop Time 1531    PT Time Calculation (min) 52 min    Activity Tolerance Patient limited by pain    Behavior During Therapy [facility] for tasks assessed/performed          Past Medical History:  Diagnosis Date   Arthritis    Breast CA (HCC)    Chest mass 03/28/2022   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 10/16/2012   Pneumonia    Pre-diabetes    Prediabetes 05/25/2022   Pyelonephritis 11/10/2022   Right knee injury 02/14/2017   S/P radiation therapy 05/09/2023   S/P Robotic Assisted Right Video Thoracoscopy with resection of Thymus 04/25/2022   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   Past Surgical History:  Procedure Laterality Date   ABDOMINAL HYSTERECTOMY  03/22/2003   BACK SURGERY  03/21/1998   BREAST SURGERY     reconstruction   colon susrgery      CYSTOSCOPY WITH STENT PLACEMENT N/A 02/08/2023   Procedure: POSSIBLE CYSTOSCOPY WITH URETERAL STENT PLACEMENT;  Surgeon: Alvaro Ricardo KATHEE Mickey., MD;  Location: [facility];   Service: Urology;  Laterality: N/A;   LEFT HEART CATH AND CORONARY ANGIOGRAPHY N/A 07/20/2023   Procedure: LEFT HEART CATH AND CORONARY ANGIOGRAPHY;  Surgeon: Verlin Lonni BIRCH, MD;  Location: [facility];  Service: Cardiovascular;  Laterality: N/A;   MASTECTOMY Bilateral 03/21/1993   RESECTION OF A THYMOMA     04-25-22   REVISION TOTAL SHOULDER TO REVERSE TOTAL SHOULDER Right 09/07/2023   Procedure: REVISION, REVERSE TOTAL ARTHROPLASTY, SHOULDER;  Surgeon: Melita Drivers, MD;  Location: [facility];  Service: Orthopedics;  Laterality: Right;  HEMI ARTHROPLASTY, BONEGRAFT GLENOID   right total shoulder     Patient Active Problem List   Diagnosis Date Noted   S/P shoulder hemiarthroplasty, 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   Abnormal EKG 07/21/2023   Chest pain 07/20/2023   Preoperative cardiovascular examination 07/19/2023   Arthritis    Breast CA (HCC)    Hyperlipidemia    Laceration of muscle, fascia and tendon of long head of biceps, unspecified arm, initial encounter    Myasthenia gravis (HCC)    Pneumonia    S/P radiation therapy 05/09/2023   Hoarseness of voice 05/09/2023   Diverticular disease 02/08/2023   History of pulmonary embolism 12/22/2022   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   Pyelonephritis 11/10/2022   Prediabetes 05/25/2022   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   S/P Robotic  Assisted Right Video Thoracoscopy with resection of Thymus 04/25/2022   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   Myasthenic syndrome (HCC) 04/12/2022   CVA (cerebral vascular accident) (HCC) 03/28/2022   HTN (hypertension) 03/28/2022   Chest mass 03/28/2022   Right knee injury 02/14/2017   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 10/16/2012   Osteoporosis 10/12/2012    PCP: Watt Harlene BROCKS, MD   REFERRING PROVIDER: Melita Drivers, MD   REFERRING DIAG: [phone] (ICD-10-CM) - Presence of right artificial shoulder joint   THERAPY DIAG:  Acute pain of right shoulder  Stiffness of right shoulder, not  elsewhere classified  Muscle weakness (generalized)  Cramp and spasm  Rationale for Evaluation and Treatment: Rehabilitation  ONSET DATE: Post-Op R TSA on 09/07/2023   NEXT MD VISIT: 11/29/2023    SUBJECTIVE:                                                                                                                                                                                      SUBJECTIVE STATEMENT: Pt had R reverse total shoulder done 08/18/23 and a repair on 09/07/2023. Pt now has to be very careful w/ her R arm. She has orders to keep the R arm waist level. She has difficulty with anything require use of her arm much above waist level, typing, forward reaching,driving, washing her hair. She has learned to do things with her other arm to compensate.  Hand dominance: Right  PERTINENT HISTORY: MG, h/o radiation (March and May 2024) for mediastinal mass, Breast CA, HTN, OP, R TSA (09/07/23)  PAIN:  Are you having pain? Yes: NPRS scale: 0/10 (current), 5-7/10 Pain location: R anterior glenoid, scapular region  Pain description: sometime sharp, throbbing, achy  Aggravating factors: reach fwd too much Relieving factors: Ice, medication   PRECAUTIONS: Shoulder: physical therapist referral - Evaluate and treat per protocol status post revision right reverse total shoulder arthroplasty to CTA hemiarthroplasty, DOS 09/07/23 Patient unfortunately had a fracture at the base of the glenosphere making it unstable and requiring removal and conversion to hemiarthroplasty. Please adjust her physical therapy to be done under a very slow and steady and gradual advance of range of motion and gradual use of the right upper extremity within her pain tolerance. Please call me for any additional questions  RED FLAGS: None   WEIGHT BEARING RESTRICTIONS: No  FALLS:  Has patient fallen in last 6 months? Yes. Number of falls a week and a half ago, feel onto buttock while pulling weeds out of yard    LIVING ENVIRONMENT: Lives with: lives alone Lives in: House/apartment Stairs: No Has following equipment at home: Single point cane, shower chair,  Grab bars, and walking stick   OCCUPATION: Retired   PLOF: Independent with household mobility with device and Leisure: watching tv, reading, walking her dog, social gatherings w/ friends to play card games   PATIENT GOALS: Get my shoulder to functioning, lifting shoulder, bearing weight   OBJECTIVE:  Note: Objective measures were completed at Evaluation unless otherwise noted.  DIAGNOSTIC FINDINGS:  09/20/2022: R shoulder XR  Results: Degenerative changes of the before meals and glenohumeral joints. No other acute findings.   PATIENT SURVEYS :  QuickDASH Score: 79.5 / 100 = 79.5 %  COGNITION: Overall cognitive status: Within functional limits for tasks assessed     SENSATION: WFL  POSTURE: Rounded shoulders, fwd head   UPPER EXTREMITY ROM:  LUE: Grossly WFL   Passive ROM Right eval  Shoulder flexion Limited, p! Less than 90 (around 60)  Shoulder extension   Shoulder abduction Limited less than 90  Shoulder adduction   Shoulder internal rotation Limited p!  Shoulder external rotation Limitied p!  (Blank rows = not tested)  UPPER EXTREMITY MMT:  MMT Right eval Left eval  Shoulder flexion  4-  Shoulder extension    Shoulder abduction  4+  Shoulder adduction    Shoulder internal rotation  4-  Shoulder external rotation  4-  Middle trapezius    Lower trapezius    Elbow flexion  5  Elbow extension  4+  Wrist flexion    Wrist extension    Wrist ulnar deviation    Wrist radial deviation    Wrist pronation    Wrist supination    Grip strength (lbs)    (Blank rows = not tested)  SHOULDER SPECIAL TESTS: Not Performed  JOINT MOBILITY TESTING:  Not Performed   PALPATION:  TTP: R ant glenoid, collar bone area (tender, not painful)                                                                                                                               TREATMENT DATE:  10/30/2023 SELF CARE:  Reviewed eval findings and role of PT in addressing identified deficits as well as instruction in initial HEP (see below).    PATIENT EDUCATION:  Education details: PT eval findings, anticipated POC, and initial HEP  Person educated: Patient Education method: Explanation, Demonstration, Verbal cues, Tactile cues, and [email or web address] app access provided Education comprehension: verbalized understanding, returned demonstration, verbal cues required, tactile cues required, and needs further education   HOME EXERCISE PROGRAM: Access Code: [email or web address] URL: [email or web address] Date: 10/30/2023 Prepared by: Eusebio Saba  Exercises - Seated Shoulder Flexion Towel Slide at Table Top  - 1 x daily - 7 x weekly - 3 sets - 10 reps - Standing Single Arm Shoulder Flexion Towel Slide at Table Top  - 1 x daily - 7 x weekly - 3 sets - 10 reps - Circular Shoulder Pendulum with Table Support  - 1 x daily - 7 x weekly - 3  sets - 10 reps  ASSESSMENT:  CLINICAL IMPRESSION: Faith Massey is a 78 y/o F referred to PT for the evaluation and treatment of R shoulder pain s/p R Reverse TSA (08/18/23) and repair of the artificial joint via hemiarthroplasty (09/07/23). Pt reports that she has orders to limit R arm use to waist level only, avoid reaching overhead at this time. She is having difficulty with forward and overhead reaching for items, opening cans w/ R hand, washing hair, and driving. She states that she has learned ways to compensate using her left arm even though she is R hand dominant. Today's assessment was done with all items remaining within patient's tolerance. PROM revealed limitations in flexion, IR, ER, and abd as movement was limited by both pain and muscular guarding. Pt had difficulty fully relaxing the R arm for PROM today and displayed hesitancy to movement beyond waist side. Pt was educated on the  importance of taking recovery slow at this point (as advised) and avoid heavy lifting and resistance training after asking to try bicep curls with a small weight at home. Pt is aware that at this stage, it is important to focus of restoring as much ROM as she can and gradually progressing towards more strengthening activities with therapy. Pt scored a 79.5% on the QuickDASH representing severe disability of the UE in day to day activities.  Olie will benefit from skilled PT intervention to address current deficits to improve functional ability, mobility, and activity tolerance w/ dec'd pain interference.    OBJECTIVE IMPAIRMENTS: decreased activity tolerance, decreased endurance, decreased knowledge of condition, decreased mobility, decreased ROM, decreased strength, increased fascial restrictions, impaired perceived functional ability, increased muscle spasms, impaired UE functional use, postural dysfunction, and pain.   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   REHAB POTENTIAL: Good  CLINICAL DECISION MAKING: Evolving/moderate complexity  EVALUATION COMPLEXITY: Moderate  GOALS: Goals reviewed with patient? Yes  SHORT TERM GOALS: Target date: 12/11/2023     Patient will be independent with initial HEP.  Baseline: Goal status: INITIAL  2.  Patient will report at least 25% improvement in R shoulder pain to improve QoL. Baseline: Worst 5-7/10 Goal status: INITIAL  3.  Patient  will improve R shoulder flexion PROM to 90 or better to aid in difficulty with forward reaching activities.  Baseline: limited by pain ~60 deg of flexion  Goal status: INITIAL 4.  Patient will decrease her QuickDASH score by at least 15% to decrease severity of disability.  Baseline: 79.5% Goal status:  INITIAL  LONG TERM GOALS: Target date: 01/22/2024   Patient will be independent with advanced HEP.  Baseline:  Goal status: INITIAL  2.  Patient will decrease her QuickDASH score to by at least 25-30% to improve function and activity tolerance d/t disability. Baseline: 79.5% Goal status: INITIAL  3.  Patient will be able to perform overhead activities such as washing hair, retrieving items from cabinets to improve independent function at home. Baseline: pt is having difficulty with above activities  Goal status: INITIAL  4.  Patient will report at least 50% improvement in R shoulder pain to improve  QoL.  Baseline: 5-7/10 Goal status: INITIAL 5.  Patient will obtain at least 3+/5 MMT of UE for available range to increase activity tolerance.   Baseline: Unable to assess Goal status: INITIAL     PLAN: PT FREQUENCY: 2x/week  PT DURATION: 12 weeks  PLANNED INTERVENTIONS: [id number]- PT Re-evaluation, 97750- Physical Performance Testing, 97110-Therapeutic exercises, 97530- Therapeutic activity, [id number]- Neuromuscular re-education, 97535- Self Care, [id number]- Manual therapy, [id number]- Gait training, [phone]- Electrical stimulation (unattended), [id number]- Ultrasound, [id number]- Traction (mechanical), 20560 (1-2 muscles), 20561 (3+ muscles)- Dry Needling, Patient/Family education, Taping, Joint mobilization, Cryotherapy, and Moist heat  PLAN FOR NEXT SESSION: Review/Revise initial HEP as needed; Gentle R shoulder ROM (PROM, AAROM); Measure R Shoulder ROM for numeric values; introduce gentle isometric strengthening / submaximal (15-20%) exercises.    Elkin Belfield Jerrye, Student-PT 10/30/2023, 6:05 PM

## 2023-10-30 ENCOUNTER — Ambulatory Visit: Attending: Orthopedic Surgery | Admitting: Physical Therapy

## 2023-10-30 ENCOUNTER — Other Ambulatory Visit: Payer: Self-pay

## 2023-10-30 ENCOUNTER — Encounter: Payer: Self-pay | Admitting: Physical Therapy

## 2023-10-30 DIAGNOSIS — M6281 Muscle weakness (generalized): Secondary | ICD-10-CM | POA: Diagnosis present

## 2023-10-30 DIAGNOSIS — M25511 Pain in right shoulder: Secondary | ICD-10-CM | POA: Diagnosis present

## 2023-10-30 DIAGNOSIS — M25611 Stiffness of right shoulder, not elsewhere classified: Secondary | ICD-10-CM | POA: Insufficient documentation

## 2023-10-30 DIAGNOSIS — R252 Cramp and spasm: Secondary | ICD-10-CM | POA: Insufficient documentation

## 2023-10-31 ENCOUNTER — Other Ambulatory Visit: Payer: Self-pay | Admitting: Family Medicine

## 2023-10-31 DIAGNOSIS — Z86711 Personal history of pulmonary embolism: Secondary | ICD-10-CM

## 2023-11-02 ENCOUNTER — Ambulatory Visit: Payer: Self-pay | Admitting: Physical Therapy

## 2023-11-02 ENCOUNTER — Encounter: Payer: Self-pay | Admitting: Physical Therapy

## 2023-11-02 DIAGNOSIS — M25611 Stiffness of right shoulder, not elsewhere classified: Secondary | ICD-10-CM

## 2023-11-02 DIAGNOSIS — M25511 Pain in right shoulder: Secondary | ICD-10-CM | POA: Diagnosis not present

## 2023-11-02 DIAGNOSIS — G7001 Myasthenia gravis with (acute) exacerbation: Secondary | ICD-10-CM | POA: Diagnosis not present

## 2023-11-02 DIAGNOSIS — G709 Myoneural disorder, unspecified: Secondary | ICD-10-CM | POA: Diagnosis not present

## 2023-11-02 DIAGNOSIS — R252 Cramp and spasm: Secondary | ICD-10-CM

## 2023-11-02 DIAGNOSIS — M6281 Muscle weakness (generalized): Secondary | ICD-10-CM

## 2023-11-02 NOTE — Therapy (Signed)
 OUTPATIENT PHYSICAL THERAPY TREATMENT   Patient Name: Faith Massey MRN: [id number] DOB:1945-07-04, 78 y.o., female Today's Date: 11/02/2023  END OF SESSION:  PT End of Session - 11/02/23 1102     Visit Number 2    Date for PT Re-Evaluation 01/22/24    Authorization Type [employer]    Progress Note Due on Visit 10    PT Start Time 1102    PT Stop Time 1148    PT Time Calculation (min) 46 min    Activity Tolerance Patient tolerated treatment well    Behavior During Therapy [facility] for tasks assessed/performed           Past Medical History:  Diagnosis Date   Arthritis    Breast CA (HCC)    Chest mass 03/28/2022   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 10/16/2012   Pneumonia    Pre-diabetes    Prediabetes 05/25/2022   Pyelonephritis 11/10/2022   Right knee injury 02/14/2017   S/P radiation therapy 05/09/2023   S/P Robotic Assisted Right Video Thoracoscopy with resection of Thymus 04/25/2022   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   Past Surgical History:  Procedure Laterality Date   ABDOMINAL HYSTERECTOMY  03/22/2003   BACK SURGERY  03/21/1998   BREAST SURGERY     reconstruction   colon susrgery      CYSTOSCOPY WITH STENT PLACEMENT N/A 02/08/2023   Procedure: POSSIBLE CYSTOSCOPY WITH URETERAL STENT PLACEMENT;  Surgeon: Alvaro Ricardo KATHEE Mickey., MD;  Location: [facility];  Service:  Urology;  Laterality: N/A;   LEFT HEART CATH AND CORONARY ANGIOGRAPHY N/A 07/20/2023   Procedure: LEFT HEART CATH AND CORONARY ANGIOGRAPHY;  Surgeon: Verlin Lonni BIRCH, MD;  Location: [facility];  Service: Cardiovascular;  Laterality: N/A;   MASTECTOMY Bilateral 03/21/1993   RESECTION OF A THYMOMA     04-25-22   REVISION TOTAL SHOULDER TO REVERSE TOTAL SHOULDER Right 09/07/2023   Procedure: REVISION, REVERSE TOTAL ARTHROPLASTY, SHOULDER;  Surgeon: Melita Drivers, MD;  Location: [facility];  Service: Orthopedics;  Laterality: Right;  HEMI ARTHROPLASTY, BONEGRAFT GLENOID   right total shoulder     Patient Active Problem List   Diagnosis Date Noted   S/P shoulder hemiarthroplasty, 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   Abnormal EKG 07/21/2023   Chest pain 07/20/2023   Preoperative cardiovascular examination 07/19/2023   Arthritis    Breast CA (HCC)    Hyperlipidemia    Laceration of muscle, fascia and tendon of long head of biceps, unspecified arm, initial encounter    Myasthenia gravis (HCC)    Pneumonia    S/P radiation therapy 05/09/2023   Hoarseness of voice 05/09/2023   Diverticular disease 02/08/2023   History of pulmonary embolism 12/22/2022   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   Pyelonephritis 11/10/2022   Prediabetes 05/25/2022   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   S/P Robotic Assisted  Right Video Thoracoscopy with resection of Thymus 04/25/2022   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   Myasthenic syndrome (HCC) 04/12/2022   CVA (cerebral vascular accident) (HCC) 03/28/2022   HTN (hypertension) 03/28/2022   Chest mass 03/28/2022   Right knee injury 02/14/2017   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 10/16/2012   Osteoporosis 10/12/2012    PCP: Watt Harlene BROCKS, MD   REFERRING PROVIDER: Melita Drivers, MD   REFERRING DIAG: [phone] (ICD-10-CM) - Presence of right artificial shoulder joint   THERAPY DIAG:  Acute pain of right shoulder  Stiffness of right shoulder, not elsewhere  classified  Muscle weakness (generalized)  Cramp and spasm  Rationale for Evaluation and Treatment: Rehabilitation  ONSET DATE: Post-Op R TSA on 09/07/2023   NEXT MD VISIT: 11/29/2023    SUBJECTIVE:                                                                                                                                                                                      SUBJECTIVE STATEMENT: Pt reports she has been working on the HEP as she is determine to get her motion back so she can work on reversing her osteoporosis.  Hand dominance: Right  PERTINENT HISTORY: MG, h/o radiation (March and May 2024) for mediastinal mass, Breast CA, HTN, OP, R TSA (09/07/23)  PAIN:  Are you having pain? Yes: NPRS scale: 0/10  Pain location: R anterior glenoid, scapular region  Pain description: sometime sharp, throbbing, achy  Aggravating factors: reach fwd too much Relieving factors: Ice, medication   PRECAUTIONS:  Shoulder: physical therapist referral - Evaluate and treat per protocol status post revision right reverse total shoulder arthroplasty to CTA hemiarthroplasty, DOS 09/07/23 Patient unfortunately had a fracture at the base of the glenosphere making it unstable and requiring removal and conversion to hemiarthroplasty. Please adjust her physical therapy to be done under a very slow and steady and gradual advance of range of motion and gradual use of the right upper extremity within her pain tolerance. Please call me for any additional questions  RED FLAGS: None   WEIGHT BEARING RESTRICTIONS: No  FALLS:  Has patient fallen in last 6 months? Yes. Number of falls a week and a half ago, feel onto buttock while pulling weeds out of yard   LIVING ENVIRONMENT: Lives with: lives alone Lives in: House/apartment Stairs: No Has following equipment at home: Single point cane, shower chair, Grab bars, and walking stick   OCCUPATION: Retired   PLOF: Independent with household  mobility with device and Leisure: watching tv, reading, walking her dog, social gatherings w/ friends to play card games   PATIENT GOALS: Get my shoulder  to functioning, lifting shoulder, bearing weight   OBJECTIVE:  Note: Objective measures were completed at Evaluation unless otherwise noted.  DIAGNOSTIC FINDINGS:  09/20/2022: R shoulder XR  Results: Degenerative changes of the before meals and glenohumeral joints. No other acute findings.   PATIENT SURVEYS :  QuickDASH Score: 79.5 / 100 = 79.5 %  COGNITION: Overall cognitive status: Within functional limits for tasks assessed     SENSATION: WFL  POSTURE: Rounded shoulders, fwd head   UPPER EXTREMITY ROM:  LUE: Grossly WFL   Passive ROM Right eval R 11/02/23  Shoulder flexion Limited, p! Less than 90 (around 60) 84  Shoulder extension    Shoulder abduction Limited less than 90 74 scaption  Shoulder adduction    Shoulder internal rotation Limited p! 35 at 30 scaption  Shoulder external rotation Limitied p! 45 at 30 scaption  (Blank rows = not tested)  Active ROM Right Left  Shoulder flexion    Shoulder extension    Shoulder abduction    Shoulder adduction    Shoulder extension    Shoulder internal rotation    Shoulder external rotation     (Blank rows = not tested)   UPPER EXTREMITY MMT:  MMT Right eval Left eval  Shoulder flexion  4-  Shoulder extension    Shoulder abduction  4+  Shoulder adduction    Shoulder internal rotation  4-  Shoulder external rotation  4-  Middle trapezius    Lower trapezius    Elbow flexion  5  Elbow extension  4+  Wrist flexion    Wrist extension    Wrist ulnar deviation    Wrist radial deviation    Wrist pronation    Wrist supination    Grip strength (lbs)    (Blank rows = not tested)  SHOULDER SPECIAL TESTS: Not Performed   JOINT MOBILITY TESTING:  Not Performed   PALPATION:  TTP: R ant glenoid, collar bone area (tender, not painful)                                                                                                                               TREATMENT DATE:   11/02/2023  THERAPEUTIC EXERCISE: To improve strength, endurance, ROM, and flexibility.  Demonstration, verbal and tactile cues throughout for technique.  Pulleys: Flexion x 3 min, Scaption x 1.5 min (discontinued d/t increasing discomfort with scaption) Table slides standing at counter top:  Flexion 2 x 10 Scaption 2 x 10 Seated R shoulder AAROM with Swiffer: Flexion 2 x 10 Scaption 2 x 10 R shoulder PROM by PT within pain tolerance for flexion, scaption, IR and ER (ER limited to ~45 per protocol) Standing R shoulder submax (~25% effort) isometrics into wall 5 x 5 - flexion, abduction, extension, IR & ER Standing scapular retraction 10 x 5  SELF CARE:  Yellow Theraputty provided for home grip strengthening   10/30/2023 SELF CARE:  Reviewed eval findings and role of  PT in addressing identified deficits as well as instruction in initial HEP (see below).    PATIENT EDUCATION:  Education details: HEP review, HEP update - submax R shoulder isometrics, scap retraction, and yellow Theraputty provided for home grip strengthening  Person educated: Patient Education method: Explanation, Demonstration, Verbal cues, Tactile cues, and MedBridge[employer] app updated Education comprehension: verbalized understanding, returned demonstration, verbal cues required, tactile cues required, and needs further education   HOME EXERCISE PROGRAM: Access Code: [email or web address] URL: [email or web address] Date: 11/02/2023 Prepared by: Faith Massey  Exercises - Seated Shoulder Flexion Towel Slide at Table Top  - 1 x daily - 7 x weekly - 3 sets - 10 reps - Standing Single Arm Shoulder Flexion Towel Slide at Table Top  - 1 x daily - 7 x weekly - 3 sets - 10 reps - Circular Shoulder Pendulum with Table Support  - 1 x daily - 7 x weekly - 3 sets - 10 reps - Isometric Shoulder Flexion at Wall   - 1 x daily - 7 x weekly - 2 sets - 5 reps - 5 sec hold - Isometric Shoulder Abduction at Wall  - 1 x daily - 7 x weekly - 2 sets - 5 reps - 5 sec hold - Isometric Shoulder Extension at Wall  - 1 x daily - 7 x weekly - 2 sets - 5 reps - 5 sec hold - Standing Isometric Shoulder Internal Rotation at Doorway  - 1 x daily - 7 x weekly - 2 sets - 5 reps - 5 sec hold - Standing Isometric Shoulder External Rotation with Doorway  - 1 x daily - 7 x weekly - 2 sets - 5 reps - 5 sec hold - Standing Scapular Retraction  - 1 x daily - 7 x weekly - 2 sets - 10 reps - 3-5 sec hold   ASSESSMENT:  CLINICAL IMPRESSION: HEP reviewed clarifying focus on R shoulder P/AAROM within pain free ROM for now, avoiding AROM.  Introduced Administrator as alternative option for [employer].  PROM remains limited by patient guarding/difficulty relaxing but ROM improved from last visit.  Initiated R shoulder submax isometric at ~25% effort, providing cues to maintain neutral shoulder alignment and keep effort at submax and pain free level.  Scapular retraction initiated focusing on avoiding shoulder extension. Eloise will benefit from continued skilled PT to address ongoing ROM and strength deficits to improve functional UE use and activity tolerance with decreased pain interference.   EVAL: Jewels Langone is a 78 y/o F referred to PT for the evaluation and treatment of R shoulder pain s/p R Reverse TSA (08/18/23) and repair of the artificial joint via hemiarthroplasty (09/07/23). Pt reports that she has orders to limit R arm use to waist level only, avoid reaching overhead at this time. She is having difficulty with forward and overhead reaching for items, opening cans w/ R hand, washing hair, and driving. She states that she has learned ways to compensate using her left arm even though she is R hand dominant. Today's assessment was done with all items remaining within patient's tolerance. PROM revealed limitations in flexion, IR, ER, and abd as  movement was limited by both pain and muscular guarding. Pt had difficulty fully relaxing the R arm for PROM today and displayed hesitancy to movement beyond waist side. Pt was educated on the importance of taking recovery slow at this point (as advised) and avoid heavy lifting and resistance training after asking to try bicep curls with a small weight at  home. Pt is aware that at this stage, it is important to focus of restoring as much ROM as she can and gradually progressing towards more strengthening activities with therapy. Pt scored a 79.5% on the QuickDASH representing severe disability of the UE in day to day activities.  Jenyfer will benefit from skilled PT intervention to address current deficits to improve functional ability, mobility, and activity tolerance w/ dec'd pain interference.   OBJECTIVE IMPAIRMENTS: decreased activity tolerance, decreased endurance, decreased knowledge of condition, decreased mobility, decreased ROM, decreased strength, increased fascial restrictions, impaired perceived functional ability, increased muscle spasms, impaired UE functional use, postural dysfunction, and pain.   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   REHAB POTENTIAL: Good  CLINICAL DECISION MAKING: Evolving/moderate complexity  EVALUATION COMPLEXITY: Moderate  GOALS: Goals reviewed with patient? Yes  SHORT TERM GOALS: Target date: 12/11/2023    Patient will be independent with  initial HEP.  Baseline: Goal status: IN PROGRESS - 11/02/23  2.  Patient will report at least 25% improvement in R shoulder pain to improve QoL. Baseline: Worst 5-7/10 Goal status: INITIAL  3.  Patient will improve R shoulder flexion PROM to 90 or better to aid in difficulty with forward reaching activities.  Baseline: limited by pain ~60 deg of flexion  Goal status: INITIAL  4.  Patient will decrease her QuickDASH score by at least 15% to decrease severity of disability.  Baseline: 79.5% Goal status:  INITIAL  LONG TERM GOALS: Target date: 01/22/2024  Patient will be independent with advanced HEP.  Baseline:  Goal status: INITIAL  2.  Patient will decrease her QuickDASH score to by at least 25-30% to improve function and activity tolerance d/t disability. Baseline: 79.5% Goal status: INITIAL  3.  Patient will be able to perform overhead activities such as washing hair, retrieving items from cabinets to improve independent function at home. Baseline: pt is having difficulty with above activities  Goal status: INITIAL  4.  Patient will report at least 50% improvement in R shoulder pain to improve QoL.  Baseline: 5-7/10 Goal status: INITIAL  5.  Patient will obtain at least 3+/5 MMT of UE for available range to increase activity tolerance.   Baseline: Unable to assess Goal status: INITIAL     PLAN: PT FREQUENCY: 2x/week  PT DURATION: 12 weeks  PLANNED INTERVENTIONS: [id number]- PT Re-evaluation, 97750- Physical Performance Testing, 97110-Therapeutic exercises, 97530- Therapeutic activity, [id number]- Neuromuscular re-education, 97535- Self Care, [id number]- Manual therapy, [phone]- Gait training, [phone]- Electrical stimulation (unattended), [phone]- Ultrasound, 02987- Traction (mechanical), [phone] (1-2 muscles), [phone] (3+ muscles)- Dry Needling, Patient/Family education, Taping, Joint mobilization, Cryotherapy, and Moist heat  PLAN FOR NEXT SESSION: Gentle R shoulder P/AAROM within pain free  motion; gentle submaximal isometric strengthening (25%, increasing effort within pain free levels); review/revise HEP as indicated    Faith CHRISTELLA Massey, PT 11/02/2023, 11:55 AM

## 2023-11-03 DIAGNOSIS — G709 Myoneural disorder, unspecified: Secondary | ICD-10-CM | POA: Diagnosis not present

## 2023-11-03 DIAGNOSIS — G7001 Myasthenia gravis with (acute) exacerbation: Secondary | ICD-10-CM | POA: Diagnosis not present

## 2023-11-06 ENCOUNTER — Ambulatory Visit: Admitting: Physical Therapy

## 2023-11-06 ENCOUNTER — Encounter: Payer: Self-pay | Admitting: Physical Therapy

## 2023-11-06 DIAGNOSIS — M6281 Muscle weakness (generalized): Secondary | ICD-10-CM

## 2023-11-06 DIAGNOSIS — M25511 Pain in right shoulder: Secondary | ICD-10-CM

## 2023-11-06 DIAGNOSIS — M25611 Stiffness of right shoulder, not elsewhere classified: Secondary | ICD-10-CM

## 2023-11-06 DIAGNOSIS — R252 Cramp and spasm: Secondary | ICD-10-CM

## 2023-11-06 NOTE — Therapy (Signed)
 OUTPATIENT PHYSICAL THERAPY TREATMENT   Patient Name: Faith Massey MRN: [id number] DOB:08/06/1945, 78 y.o., female Today's Date: 11/06/2023  END OF SESSION:  PT End of Session - 11/06/23 1103     Visit Number 3    Date for PT Re-Evaluation 01/22/24    Authorization Type [employer]    Progress Note Due on Visit 10    PT Start Time 1103    PT Stop Time 1148    PT Time Calculation (min) 45 min    Activity Tolerance Patient tolerated treatment well    Behavior During Therapy [facility] for tasks assessed/performed            Past Medical History:  Diagnosis Date   Arthritis    Breast CA (HCC)    Chest mass 03/28/2022   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 10/16/2012   Pneumonia    Pre-diabetes    Prediabetes 05/25/2022   Pyelonephritis 11/10/2022   Right knee injury 02/14/2017   S/P radiation therapy 05/09/2023   S/P Robotic Assisted Right Video Thoracoscopy with resection of Thymus 04/25/2022   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   Past Surgical History:  Procedure Laterality Date   ABDOMINAL HYSTERECTOMY  03/22/2003   BACK SURGERY  03/21/1998   BREAST SURGERY     reconstruction   colon susrgery      CYSTOSCOPY WITH STENT PLACEMENT N/A 02/08/2023   Procedure: POSSIBLE CYSTOSCOPY WITH URETERAL STENT PLACEMENT;  Surgeon: Alvaro Ricardo KATHEE Mickey., MD;  Location: [facility];  Service:  Urology;  Laterality: N/A;   LEFT HEART CATH AND CORONARY ANGIOGRAPHY N/A 07/20/2023   Procedure: LEFT HEART CATH AND CORONARY ANGIOGRAPHY;  Surgeon: Verlin Lonni BIRCH, MD;  Location: [facility];  Service: Cardiovascular;  Laterality: N/A;   MASTECTOMY Bilateral 03/21/1993   RESECTION OF A THYMOMA     04-25-22   REVISION TOTAL SHOULDER TO REVERSE TOTAL SHOULDER Right 09/07/2023   Procedure: REVISION, REVERSE TOTAL ARTHROPLASTY, SHOULDER;  Surgeon: Melita Drivers, MD;  Location: [facility];  Service: Orthopedics;  Laterality: Right;  HEMI ARTHROPLASTY, BONEGRAFT GLENOID   right total shoulder     Patient Active Problem List   Diagnosis Date Noted   S/P shoulder hemiarthroplasty, 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   Abnormal EKG 07/21/2023   Chest pain 07/20/2023   Preoperative cardiovascular examination 07/19/2023   Arthritis    Breast CA (HCC)    Hyperlipidemia    Laceration of muscle, fascia and tendon of long head of biceps, unspecified arm, initial encounter    Myasthenia gravis (HCC)    Pneumonia    S/P radiation therapy 05/09/2023   Hoarseness of voice 05/09/2023   Diverticular disease 02/08/2023   History of pulmonary embolism 12/22/2022   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   Pyelonephritis 11/10/2022   Prediabetes 05/25/2022   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   S/P Robotic  Assisted Right Video Thoracoscopy with resection of Thymus 04/25/2022   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   Myasthenic syndrome (HCC) 04/12/2022   CVA (cerebral vascular accident) (HCC) 03/28/2022   HTN (hypertension) 03/28/2022   Chest mass 03/28/2022   Right knee injury 02/14/2017   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 10/16/2012   Osteoporosis 10/12/2012    PCP: Watt Harlene BROCKS, MD   REFERRING PROVIDER: Melita Drivers, MD   REFERRING DIAG: [phone] (ICD-10-CM) - Presence of right artificial shoulder joint   THERAPY DIAG:  Acute pain of right shoulder  Stiffness of right shoulder, not elsewhere  classified  Muscle weakness (generalized)  Cramp and spasm  RATIONALE FOR EVALUATION AND TREATMENT: Rehabilitation  ONSET DATE: Post-Op R TSA revision/conversion to hemi-arthroplasty on 09/07/2023   NEXT MD VISIT: 11/29/2023    SUBJECTIVE:                                                                                                                                                                                      SUBJECTIVE STATEMENT: Pt reports only occasional fleeting pain not always associated with any particular activity. She reports she was working with her plants this morning w/o any issues.  She is almost able to reach the faucet to turn on the water  with her R hand.  Hand dominance: Right  PERTINENT HISTORY: MG, h/o radiation (March and May 2024) for mediastinal mass, Breast CA, HTN, OP, R TSA (09/07/23)  PAIN:  Are you having pain? No and Yes: NPRS scale: 0/10  Pain location: R anterior glenoid, scapular region  Pain description: sometime sharp, throbbing, achy  Aggravating factors: reach fwd too much Relieving factors: Ice, medication   PRECAUTIONS:  Shoulder: physical therapist referral - Evaluate and treat per protocol status post revision right reverse total shoulder arthroplasty to CTA hemiarthroplasty, DOS 09/07/23 Patient unfortunately had a fracture at the base of the glenosphere making it unstable and requiring removal and conversion to hemiarthroplasty. Please adjust her physical therapy to be done under a very slow and steady and gradual advance of range of motion and gradual use of the right upper extremity within her pain tolerance. Please call me for any additional questions  RED FLAGS: None   WEIGHT BEARING RESTRICTIONS: No  FALLS:  Has patient fallen in last 6 months? Yes. Number of falls a week and a half ago, feel onto buttock while pulling weeds out of yard   LIVING ENVIRONMENT: Lives with: lives alone Lives in: House/apartment Stairs: No Has  following equipment at home: Single point cane, shower chair, Grab bars, and walking stick   OCCUPATION: Retired   PLOF: Independent with household mobility with  device and Leisure: watching tv, reading, walking her dog, social gatherings w/ friends to play card games   PATIENT GOALS: Get my shoulder to functioning, lifting shoulder, bearing weight   OBJECTIVE:  Note: Objective measures were completed at Evaluation unless otherwise noted.  DIAGNOSTIC FINDINGS:  09/20/2022: R shoulder XR  Results: Degenerative changes of the before meals and glenohumeral joints. No other acute findings.   PATIENT SURVEYS :  QuickDASH Score: 79.5 / 100 = 79.5 %  COGNITION: Overall cognitive status: Within functional limits for tasks assessed     SENSATION: WFL  POSTURE: Rounded shoulders, fwd head   UPPER EXTREMITY ROM:  LUE: Grossly WFL   Passive ROM Right eval R 11/02/23  Shoulder flexion Limited, p! Less than 90 (around 60) 84  Shoulder extension    Shoulder abduction Limited less than 90 74 scaption  Shoulder adduction    Shoulder internal rotation Limited p! 35 at 30 scaption  Shoulder external rotation Limitied p! 45 at 30 scaption  (Blank rows = not tested)  Active ROM Right Left  Shoulder flexion    Shoulder extension    Shoulder abduction    Shoulder adduction    Shoulder extension    Shoulder internal rotation    Shoulder external rotation     (Blank rows = not tested)   UPPER EXTREMITY MMT:  MMT Right eval Left eval  Shoulder flexion  4-  Shoulder extension    Shoulder abduction  4+  Shoulder adduction    Shoulder internal rotation  4-  Shoulder external rotation  4-  Middle trapezius    Lower trapezius    Elbow flexion  5  Elbow extension  4+  Wrist flexion    Wrist extension    Wrist ulnar deviation    Wrist radial deviation    Wrist pronation    Wrist supination    Grip strength (lbs)    (Blank rows = not tested)  SHOULDER SPECIAL TESTS: Not  Performed   JOINT MOBILITY TESTING:  Not Performed   PALPATION:  TTP: R ant glenoid, collar bone area (tender, not painful)                                                                                                                              TREATMENT DATE:   11/06/2023  THERAPEUTIC EXERCISE: To improve ROM and flexibility.  Demonstration, verbal and tactile cues throughout for technique.  Pulleys: Flexion x 3 min, Scaption x 3 min (scaption better tolerated today) Seated R shoulder AAROM with Swiffer: Flexion 2 x 10 Scaption 2 x 10 R shoulder PROM by PT within pain tolerance for flexion, scaption, IR and ER  Hooklying R shoulder AAROM with cane: Flexion 2 x 10 Scaption x 10 - pt needing PT guidance/assistance for proper movement pattern  NEUROMUSCULAR RE-EDUCATION: To improve coordination, kinesthesia, posture, and proprioception. R shoulder rhythmic stabilization at 90 flexion (1-finger perturbations) 2 x 15-20 sec R shoulder protraction  AAROM with wand 2 x 10 Standing YTB scap retraction + B shoulder row x 10 Standing YTB scap retraction + B shoulder extension stopping shy of neutral x 10  MANUAL THERAPY: To promote improved flexibility and increased ROM utilizing scar mobilization.  STM/scar massage/mobilization performed to anterior shoulder incision with significant adhesion noted and tiny scab/possible retained suture present at lateral end of incision (pt reports it feels like the suture is still there and poking her) - Pt instructed in self scar massage to help free scar for increased ease of shoulder ROM   11/02/2023  THERAPEUTIC EXERCISE: To improve strength, endurance, ROM, and flexibility.  Demonstration, verbal and tactile cues throughout for technique.  Pulleys: Flexion x 3 min, Scaption x 1.5 min (discontinued d/t increasing discomfort with scaption) Table slides standing at counter top:  Flexion 2 x 10 Scaption 2 x 10 Seated R shoulder AAROM with  Swiffer: Flexion 2 x 10 Scaption 2 x 10 R shoulder PROM by PT within pain tolerance for flexion, scaption, IR and ER (ER limited to ~45 per protocol) Standing R shoulder submax (~25% effort) isometrics into wall 5 x 5 - flexion, abduction, extension, IR & ER Standing scapular retraction 10 x 5  SELF CARE:  Yellow Theraputty provided for home grip strengthening   10/30/2023 SELF CARE:  Reviewed eval findings and role of PT in addressing identified deficits as well as instruction in initial HEP (see below).    PATIENT EDUCATION:  Education details: HEP review, HEP update - submax R shoulder isometrics, scap retraction, and yellow Theraputty provided for home grip strengthening  Person educated: Patient Education method: Explanation, Demonstration, Verbal cues, Tactile cues, and MedBridge[employer] app updated Education comprehension: verbalized understanding, returned demonstration, verbal cues required, tactile cues required, and needs further education   HOME EXERCISE PROGRAM: Access Code: [email or web address] URL: [email or web address] Date: 11/02/2023 Prepared by: Elijah Hidden  Exercises - Seated Shoulder Flexion Towel Slide at Table Top  - 1 x daily - 7 x weekly - 3 sets - 10 reps - Standing Single Arm Shoulder Flexion Towel Slide at Table Top  - 1 x daily - 7 x weekly - 3 sets - 10 reps - Circular Shoulder Pendulum with Table Support  - 1 x daily - 7 x weekly - 3 sets - 10 reps - Isometric Shoulder Flexion at Wall  - 1 x daily - 7 x weekly - 2 sets - 5 reps - 5 sec hold - Isometric Shoulder Abduction at Wall  - 1 x daily - 7 x weekly - 2 sets - 5 reps - 5 sec hold - Isometric Shoulder Extension at Wall  - 1 x daily - 7 x weekly - 2 sets - 5 reps - 5 sec hold - Standing Isometric Shoulder Internal Rotation at Doorway  - 1 x daily - 7 x weekly - 2 sets - 5 reps - 5 sec hold - Standing Isometric Shoulder External Rotation with Doorway  - 1 x daily - 7 x weekly - 2 sets - 5 reps - 5  sec hold - Standing Scapular Retraction  - 1 x daily - 7 x weekly - 2 sets - 10 reps - 3-5 sec hold   ASSESSMENT:  CLINICAL IMPRESSION: Faith Massey reports her pain remains well controlled and she feels like her motion is improving, noting increased ease of reaching for her faucet.  She was able to complete the pulleys for both flexion and scaption w/o discomfort today and increased ROM noted during PROM.  Scar massage revealing significant adhesion at anterior shoulder and tiny scab/possible retained suture present at lateral end of incision (pt reports it feels like the suture is still there and poking her) - she reports plan to f/u with surgical PA regarding this.  Introduced [employer] with good tolerance for flexion but PT assistance required for scaption movement pattern.  YTB added to scapular strengthening but clicking noted in R shoulder (no pain), therefore deferred from HEP for now.  Faith Massey will benefit from continued skilled PT to address ongoing ROM and strength deficits to improve functional UE use and activity tolerance with decreased pain interference.   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 Pt reports that she has orders to limit R arm use to waist level only, avoid reaching overhead at this time. She is having difficulty with forward and overhead reaching for items, opening cans w/ R hand, washing hair, and driving. She states that she has learned ways to compensate using her left arm even though she is R hand dominant. Today's assessment was done with all items remaining within patient's tolerance. PROM revealed limitations in flexion, IR, ER, and abd as movement was limited by both pain and muscular guarding. Pt had difficulty fully relaxing the R arm for PROM today and displayed hesitancy to movement beyond waist side. Pt was educated on the  importance of taking recovery slow at this point (as advised) and avoid heavy lifting and resistance training after asking to try bicep curls with a small weight at home. Pt is aware that at this stage, it is important to focus of restoring as much ROM as she can and gradually progressing towards more strengthening activities with therapy. Pt scored a 79.5% on the QuickDASH representing severe disability of the UE in day to day activities.  Faith Massey will benefit from skilled PT intervention to address current deficits to improve functional ability, mobility, and activity tolerance w/ dec'd pain interference.   OBJECTIVE IMPAIRMENTS: decreased activity tolerance, decreased endurance, decreased knowledge of condition, decreased mobility, decreased ROM, decreased strength, increased fascial restrictions, impaired perceived functional ability, increased muscle spasms, impaired UE functional use, postural dysfunction, and pain.   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   REHAB POTENTIAL: Good  CLINICAL DECISION MAKING: Evolving/moderate complexity  EVALUATION COMPLEXITY: Moderate  GOALS: Goals reviewed with patient? Yes  SHORT TERM GOALS: Target date: 12/11/2023    Patient will be independent with initial HEP.  Baseline: Goal status: IN PROGRESS - 11/02/23  2.  Patient will report at least 25% improvement in R shoulder pain to improve QoL. Baseline: Worst 5-7/10 Goal status:  INITIAL  3.  Patient will improve R shoulder flexion PROM to 90 or better to aid in difficulty with forward reaching activities.  Baseline: limited by pain ~60 deg of flexion  Goal status: INITIAL  4.  Patient will decrease her QuickDASH score by at least 15% to decrease severity of disability.  Baseline: 79.5% Goal status:  INITIAL  LONG TERM GOALS: Target date: 01/22/2024  Patient will be independent with advanced HEP.  Baseline:  Goal status: INITIAL  2.  Patient will decrease her QuickDASH score to by at least 25-30% to improve function and activity tolerance d/t disability. Baseline: 79.5% Goal status: INITIAL  3.  Patient will be able to perform overhead activities such as washing hair, retrieving items from cabinets to improve independent function at home. Baseline: pt is having difficulty with above activities  Goal status: INITIAL  4.  Patient will report at least 50% improvement in R shoulder pain to improve QoL.  Baseline: 5-7/10 Goal status: INITIAL  5.  Patient will obtain at least 3+/5 MMT of UE for available range to increase activity tolerance.   Baseline: Unable to assess Goal status: INITIAL     PLAN: PT FREQUENCY: 2x/week  PT DURATION: 12 weeks  PLANNED INTERVENTIONS: [id number]- PT Re-evaluation, 97750- Physical Performance Testing, 97110-Therapeutic exercises, 97530- Therapeutic activity, [id number]- Neuromuscular re-education, 97535- Self Care, 02859- Manual therapy, [phone]- Gait training, [phone]- Electrical stimulation (unattended), [phone]- Ultrasound, 02987- Traction (mechanical), [phone] (1-2 muscles), [id number] (3+ muscles)- Dry Needling, Patient/Family education, Taping, Joint mobilization, Cryotherapy, and Moist heat  PLAN FOR NEXT SESSION: Gentle R shoulder P/AAROM within pain free motion; gentle submaximal isometric strengthening (25%, increasing effort within pain free levels); review/revise HEP as indicated; may use Reverse Total Shoulder Arthroplasty for Fracture  Protocol for guidance (Sx 09/07/23) - post-op week #8 as of 11/02/23   Elijah CHRISTELLA Hidden, PT 11/06/2023, 12:28 PM

## 2023-11-08 ENCOUNTER — Ambulatory Visit: Payer: Self-pay

## 2023-11-08 DIAGNOSIS — M25511 Pain in right shoulder: Secondary | ICD-10-CM

## 2023-11-08 DIAGNOSIS — M6281 Muscle weakness (generalized): Secondary | ICD-10-CM

## 2023-11-08 DIAGNOSIS — M25611 Stiffness of right shoulder, not elsewhere classified: Secondary | ICD-10-CM

## 2023-11-08 NOTE — Progress Notes (Unsigned)
 No chief complaint on file.   HISTORY OF PRESENT ILLNESS:  11/08/23 ALL:  Faith Massey is a 78 y.o. female here today for follow up for MG. She was last seen by Dr Faith Massey 04/2023 and concerned of worsening raspy voice following intubation 01/2023. Advised to continue treatment plan with ENT. Octogam continued every 3 weeks and pryridostigmine 60mg  QID PRN.   Since,    HISTORY (copied from Dr Faith Massey previous note)  Faith Massey is a 77 year old female, accompanied by Faith Massey, seen in request by Dr. Rosemarie Eather Massey, for evaluation of myasthenia gravis,   I reviewed and summarized the referring note.PMHX HTN HLD Breast Cancer, s/p double mastectomy in 1990   Patient lives in independent living, very active, has not seen Faith primary care since beginning of 2023,   In December 2023, she noticed lack of stamina, easily fatigued, shortness of breath walking Faith dog walking up hills,   Woke up March 25, 2022, noticed right ptosis, intermittent hoarse voice, which has been persistent that was noticed by Faith church friend, and family members, who urged Faith to call primary care on January 8, who directed Faith to emergency room, she had a stroke evaluation, personally reviewed MRI of the brain March 28, 2022, small acute DWI lesions at the right frontal white matter, moderate periventricular small vessel disease   CT angiogram of head and neck showed no large vessel disease, incidental findings of anterior mediastinal mass   CT angiogram of the chest with without contrast showed 2.3 x 4 x 5.3 cm soft tissue mass within the right anterior mediastinum   At Faith follow-up visit with Dr. Rosemarie on March 31, 2022, she was noted to have right ptosis, mild bulbar weakness,   Acetylcholine receptor antibody was positive, blocking 51, binding 27.8, confirmed the diagnosis of myasthenia gravis, she has pending appointment with cardiothoracic surgeon Faith Massey on April 13, 2022    Rest of the laboratory evaluation showed normal CPK TSH, A1c 5.7, lipid panel LDL of 125, CBC WBC was elevated 12, normal hemoglobin of 13, CMP showed calcium  8.7, normal kidney function   Also discussed with Faith son Faith Massey over the phone in detail about Faith diagnosis,   UPDATE May 11 2022: She is companied by Faith daughter at today's clinical visit, status post robotic assisted thyroidectomy by Faith Massey on April 25, 2022, Faith postoperative course was uncomplicated and she went home on day 2.,  Recovered very well, pathology showed benign thymoma   A. THYMUS, THYMECTOMY:  - Thymoma, type B2  - Focal extension into thymic fat present  - Margins appear focally involved  - See oncology table and comment   B. LYMPH NODE, MEDIASTINAL, BIOPSY:  - Three benign lymph nodes (0/3)    She was seen by Faith Massey on May 10, 2022, she began to complains cough congestion on May 07, 2022, no fever,   CT angiogram of chest chest on May 10 2022:   1. No evidence of pulmonary embolism or other acute intrathoracic process. 2. Previously seen anterior mediastinal mass has been resected. No residual mass or fluid collection within the anterior mediastinum. 3. Aortic atherosclerosis (ICD10-I70.0).   She was doing very well postsurgically, prior to surgery, she has had drop, shortness of breath with minimal exertion, mild dysphagia, has to be chew carefully, intermittent ptosis,   Few days after surgery, she has significant improvement but concurrent with Faith upper respiratory symptoms, she noticed worsening myasthenia gravis symptoms,  more shortness of breath, difficult to lie flat, has been sleeping in a sitting position over the past couple days, denies fever, denied double vision, no significant head drop,   She is taking Mestinon  60 mg 3 times daily   UPDATE June 03 2022: She is accompanied by Faith friend at today's visit, was started on prednisone  tapering 40 mg daily since  May 12, 2022, 5 mg decrement every week, currently on 30 mg daily, complains of side effect metallic taste in Faith mouth, moody, worsening glucose, A1c was 6.1   Faith muscle strength showed some improvement, she can sleep in Faith bed now, but continue noticeable upper and lower extremity weakness, difficulty raising arm overhead to Faith cabinet, get tired easily, losing balance, mild swallowing difficulty if she chew too fast, denied double vision   Pathology of thymus showed Type B2 thymoma with focal extension into the thymic fat and focally involved margins.  Evaluated by radiation oncologist Faith Massey on May 30 2022, proceed with radiation daily for 6 weeks     UPDATE June 24th 2024: She is receiving home IVIG, loading dose in May 2024, 1g/kg x2, noticed improvement after that, being off prednisone  since June 2024, did not notice worsening weakness, continue taking Mestinon  60 mg 2-3 times a day, torn Faith right biceps muscle few months ago, still dealing with significant pain, she walks Faith 15 pounds dog few times each day, but complains of unsteady gait, use a cane as needed   Today's examination showed no significant extraocular muscle, bulbar or limb muscle weakness   UPDATE Mar 08 2023: Hospitalization in August 2024 for acute pyelonephritis, E. 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   She developed some upper respiratory symptoms since Dec 2024, trouble swallowing since, take one hour to finish a piece of toast,  no double vision, no ptosis, still some sob, was put on Augmentin  on Dec 17th,    She is receiving home IVIG every 3 week, by Faith Massey from [facility] [phone] , last infusion was Dec 5, 6th.    She has  tried mestinon  60mg  , did help Faith symptoms some, today's examination showed mild to moderate neck flexion bilateral upper extremity proximal muscle weakness,   UPDATE May 09 2023: She came in early than expected, communicated with Faith ENT Dr. Carlie, who has seen Faith recently for complaints of continued raspy voice,   She had a transient hoarseness following Faith intubation in November 2024 while having surgery, but quickly recovered, around February 25, 2023, concurrent with Faith upper respiratory symptoms, sore throat, she noticed raspy voice, which has been persistent despite recovering from that upper respiratory infection   She also get extra loading dose of IVIG in December, which has helped Faith other myasthenia gravis symptoms such as generalized fatigue weakness, swallowing difficulty, she now Mestinon  60 mg twice a day as needed, walk Faith dog regularly, denied double vision no ptosis, no swallowing difficulty, shortness of breath when walking up hills,   Today's examination showed excellent motor strength, no bulbar or limb muscle weakness no head drop   REVIEW OF SYSTEMS: Out of a complete 14 system review of symptoms, the patient complains only of the following symptoms, and all other reviewed systems  are negative.   ALLERGIES: Allergies  Allergen Reactions   Prednisone  Shortness Of Breath, Swelling, Palpitations, Dermatitis and Hypertension     Reports medication caused swelling and metallic taste in mouth.     HOME MEDICATIONS: Outpatient Medications Prior to Visit  Medication Sig Dispense Refill   acetaminophen  (TYLENOL ) 650 MG CR tablet Take 1,300 mg by mouth every 8 (eight) hours as needed for pain.     BLACK ELDERBERRY PO Take 1 capsule by mouth daily as needed (immune support).     Cholecalciferol (VITAMIN D -3) 5000 UNITS TABS Take 5,000 Units by mouth daily. (Patient not taking: Reported on 10/30/2023)     COLLAGEN PO Take 1 Scoop by mouth daily.     Desiccated Beef Liver  POWD 4 capsules by Does not apply route daily.     ELIQUIS  2.5 MG TABS tablet TAKE ONE TABLET BY MOUTH TWICE A DAY 180 tablet 2   lisinopril  (ZESTRIL ) 20 MG tablet Take 1.5 tablets (30 mg total) by mouth daily. Take one tablet and a half, total of 30 mg daily. 135 tablet 1   Melatonin 10 MG CAPS Take 10 mg by mouth at bedtime.     Multiple Vitamins-Minerals (MULTIVITAMIN WITH MINERALS) tablet Take 1 tablet by mouth daily.     OCTAGAM 20 GM/200ML SOLN Inject 50 g into the vein. 03/29/2023 Takes 50 grams for 2 days every 3 weeks.     OCTAGAM 5 GM/50ML SOLN      OVER THE COUNTER MEDICATION Take 1 tablet by mouth in the morning and at bedtime. Candidase     OVER THE COUNTER MEDICATION Take 1 tablet by mouth daily. Betaglutamine (Patient not taking: Reported on 10/30/2023)     oxyCODONE -acetaminophen  (PERCOCET/ROXICET) 5-325 MG tablet Take 1 tablet by mouth 4 (four) times daily as needed for severe pain (pain score 7-10). (Patient not taking: Reported on 10/30/2023) 30 tablet 0   Probiotic Product (PROBIOTIC PO) Take 1-2 capsules by mouth daily.     pyridostigmine  (MESTINON ) 60 MG tablet Take 1 tablet (60 mg total) by mouth 4 (four) times daily as needed. 90 tablet 6   Spirulina POWD 4 capsules by Does not apply route daily.     traMADol  (ULTRAM ) 50 MG tablet Take 1 tablet (50 mg total) by mouth every 6 (six) hours as needed. 20 tablet 0   UNABLE TO FIND Take 0.25 tablets by mouth at bedtime. Med Name: A quarter of a CBD gummy daily at bedtime.     No facility-administered medications prior to visit.     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 Lynwood Nasuti   Hoarseness of voice 05/09/2023   HTN (hypertension) 03/28/2022    Hyperlipidemia    Hypertension    Laceration of muscle, fascia and tendon of long head of biceps, unspecified arm, initial encounter    right arm  involving rotator  cuff   Myasthenia gravis (HCC)    Dx'd 03/2022   Myasthenic syndrome (HCC) 04/12/2022   Osteoporosis 10/12/2012   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 10/16/2012   Pneumonia    Pre-diabetes    Prediabetes 05/25/2022   Pyelonephritis 11/10/2022   Right knee injury  02/14/2017   S/P radiation therapy 05/09/2023   S/P Robotic Assisted Right Video Thoracoscopy with resection of Thymus 04/25/2022   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     PAST SURGICAL HISTORY: Past Surgical History:  Procedure Laterality Date   ABDOMINAL HYSTERECTOMY  03/22/2003   BACK SURGERY  03/21/1998   BREAST SURGERY     reconstruction   colon susrgery      CYSTOSCOPY WITH STENT PLACEMENT N/A 02/08/2023   Procedure: POSSIBLE CYSTOSCOPY WITH URETERAL STENT PLACEMENT;  Surgeon: Alvaro Ricardo KATHEE Mickey., MD;  Location: [facility];  Service: Urology;  Laterality: N/A;   LEFT HEART CATH AND CORONARY ANGIOGRAPHY N/A 07/20/2023   Procedure: LEFT HEART CATH AND CORONARY ANGIOGRAPHY;  Surgeon: Verlin Lonni BIRCH, MD;  Location: [facility];  Service: Cardiovascular;  Laterality: N/A;   MASTECTOMY Bilateral 03/21/1993   RESECTION OF A THYMOMA     04-25-22   REVISION TOTAL SHOULDER TO REVERSE TOTAL SHOULDER Right 09/07/2023   Procedure: REVISION, REVERSE TOTAL ARTHROPLASTY, SHOULDER;  Surgeon: Melita Drivers, MD;  Location: [facility];  Service: Orthopedics;  Laterality: Right;  HEMI ARTHROPLASTY, BONEGRAFT GLENOID   right total shoulder       FAMILY HISTORY: Family History  Problem Relation Age of Onset   Stroke Mother    Hypertension Mother    Myasthenia gravis Mother    Hypertension Father    Colon cancer Neg Hx    Esophageal cancer Neg Hx      SOCIAL HISTORY: Social History   Socioeconomic History    Marital status: Divorced    Spouse name: Not on file   Number of children: Not on file   Years of education: Not on file   Highest education level: Bachelor's degree (e.g., BA, AB, BS)  Occupational History   Not on file  Tobacco Use   Smoking status: Never    Passive exposure: Never   Smokeless tobacco: Never  Vaping Use   Vaping status: Never Used  Substance and Sexual Activity   Alcohol use: Not Currently    Alcohol/week: 1.0 standard drink of alcohol    Types: 1 Glasses of wine per week    Comment: occasional   very rare   Drug use: No   Sexual activity: Not Currently  Other Topics Concern   Not on file  Social History Narrative   Lives alone   Right handed   Caffeine- 2 cups of coffee   Social Drivers of Health   Financial Resource Strain: Low Risk  (10/02/2023)   Overall Financial Resource Strain (CARDIA)    Difficulty of Paying Living Expenses: Not very hard  Recent Concern: Financial Resource Strain - Medium Risk (07/11/2023)   Overall Financial Resource Strain (CARDIA)    Difficulty of Paying Living Expenses: Somewhat hard  Food Insecurity: No Food Insecurity (10/02/2023)   Hunger Vital Sign    Worried About Running Out of Food in the Last Year: Never true    Ran Out of Food in the Last Year: Never true  Transportation Needs: No Transportation Needs (10/02/2023)   PRAPARE - Administrator, Civil Service (Medical): No    Lack of Transportation (Non-Medical): No  Physical Activity: Unknown (10/02/2023)   Exercise Vital Sign    Days of Exercise per Week: Patient declined    Minutes of Exercise per Session: Not on file  Recent Concern: Physical Activity - Insufficiently Active (07/11/2023)   Exercise Vital Sign    Days  of Exercise per Week: 7 days    Minutes of Exercise per Session: 10 min  Stress: No Stress Concern Present (10/02/2023)   Faith Massey of Occupational Health - Occupational Stress Questionnaire    Feeling of Stress: Only a little   Recent Concern: Stress - Stress Concern Present (07/11/2023)   Faith Massey of Occupational Health - Occupational Stress Questionnaire    Feeling of Stress : To some extent  Social Connections: Moderately Integrated (10/02/2023)   Social Connection and Isolation Panel    Frequency of Communication with Friends and Family: More than three times a week    Frequency of Social Gatherings with Friends and Family: More than three times a week    Attends Religious Services: More than 4 times per year    Active Member of [employer] or Organizations: Yes    Attends Engineer, structural: More than 4 times per year    Marital Status: Divorced  Intimate Partner Violence: Not At Risk (07/20/2023)   Humiliation, Afraid, Rape, and Kick questionnaire    Fear of Current or Ex-Partner: No    Emotionally Abused: No    Physically Abused: No    Sexually Abused: No     PHYSICAL EXAM  There were no vitals filed for this visit. There is no height or weight on file to calculate BMI.  Generalized: Well developed, in no acute distress  Cardiology: normal rate and rhythm, no murmur auscultated  Respiratory: clear to auscultation bilaterally    Neurological examination  Mentation: Alert oriented to time, place, history taking. Follows all commands speech and language fluent Cranial nerve II-XII: Pupils were equal round reactive to light. Extraocular movements were full, visual field were full on confrontational test. Facial sensation and strength were normal. Uvula tongue midline. Head turning and shoulder shrug  were normal and symmetric. Motor: The motor testing reveals 5 over 5 strength of all 4 extremities. Good symmetric motor tone is noted throughout.  Sensory: Sensory testing is intact to soft touch on all 4 extremities. No evidence of extinction is noted.  Coordination: Cerebellar testing reveals good finger-nose-finger and heel-to-shin bilaterally.  Gait and station: Gait is normal. Tandem  gait is normal. Romberg is negative. No drift is seen.  Reflexes: Deep tendon reflexes are symmetric and normal bilaterally.    DIAGNOSTIC DATA (LABS, IMAGING, TESTING) - I reviewed patient records, labs, notes, testing and imaging myself where available.  Lab Results  Component Value Date   WBC 13.2 (H) 08/10/2023   HGB 11.7 (L) 08/10/2023   HCT 36.2 08/10/2023   MCV 97.3 08/10/2023   PLT 292 08/10/2023      Component Value Date/Time   NA 137 10/09/2023 1332   NA 142 09/12/2022 1204   K 4.9 10/09/2023 1332   CL 103 10/09/2023 1332   CO2 25 10/09/2023 1332   GLUCOSE 92 10/09/2023 1332   BUN 32 (H) 10/09/2023 1332   BUN 17 09/12/2022 1204   CREATININE 0.76 10/09/2023 1332   CREATININE 0.98 08/10/2023 1441   CREATININE 0.61 05/06/2013 1003   CALCIUM  9.5 10/09/2023 1332   PROT 7.1 08/10/2023 1441   PROT 6.4 09/12/2022 1204   ALBUMIN  4.5 08/10/2023 1441   ALBUMIN  4.0 09/12/2022 1204   AST 18 08/10/2023 1441   ALT 15 08/10/2023 1441   ALKPHOS 89 08/10/2023 1441   BILITOT 0.7 08/10/2023 1441   GFRNONAA >60 08/10/2023 1441   GFRNONAA >89 05/06/2013 1003   GFRAA >89 05/06/2013 1003   Lab Results  Component Value Date   CHOL 203 (H) 07/20/2023   HDL 72 07/20/2023   LDLCALC 118 (H) 07/20/2023   TRIG 64 07/20/2023   CHOLHDL 2.8 07/20/2023   Lab Results  Component Value Date   HGBA1C 5.4 07/20/2023   Lab Results  Component Value Date   VITAMINB12 175 (L) 08/10/2023   Lab Results  Component Value Date   TSH 0.848 09/12/2022        No data to display               No data to display           ASSESSMENT AND PLAN  78 y.o. year old female  has a past medical history of Arthritis, Breast CA (HCC), Chest mass (03/28/2022), Colovesical fistula (11/17/2022), Diverticular disease (02/08/2023), Dyspnea, History of pulmonary embolism (12/22/2022), History of radiation therapy, Hoarseness of voice (05/09/2023), HTN (hypertension) (03/28/2022), Hyperlipidemia,  Hypertension, Laceration of muscle, fascia and tendon of long head of biceps, unspecified arm, initial encounter, Myasthenia gravis (HCC), Myasthenic syndrome (HCC) (04/12/2022), Osteoporosis (10/12/2012), 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 (10/16/2012), Pneumonia, Pre-diabetes, Prediabetes (05/25/2022), Pyelonephritis (11/10/2022), Right knee injury (02/14/2017), S/P radiation therapy (05/09/2023), S/P Robotic Assisted Right Video Thoracoscopy with resection of Thymus (04/25/2022), Thymus neoplasm (04/12/2022), and Type B2 thymoma (HCC) (05/12/2022). here with    Myasthenia gravis status post thymectomy (HCC)  Thymus neoplasm  S/P radiation therapy  Hoarseness of voice  [place] ***.  Healthy lifestyle habits encouraged. *** will follow up with PCP as directed. *** will return to see me in ***, sooner if needed. *** verbalizes understanding and agreement with this plan.   No orders of the defined types were placed in this encounter.    No orders of the defined types were placed in this encounter.    Greig Forbes, MSN, FNP-C 11/08/2023, 11:23 AM  [facility] [address], [address] [place], [place] [address] [phone]

## 2023-11-08 NOTE — Patient Instructions (Signed)
Below is our plan: ? ?We will continue current treatment plan  ? ?Please make sure you are staying well hydrated. I recommend 50-60 ounces daily. Well balanced diet and regular exercise encouraged. Consistent sleep schedule with 6-8 hours recommended.  ? ?Please continue follow up with care team as directed.  ? ?Follow up with me in 6 months ? ?You may receive a survey regarding today's visit. I encourage you to leave honest feed back as I do use this information to improve patient care. Thank you for seeing me today!  ? ? ?

## 2023-11-08 NOTE — Therapy (Signed)
 OUTPATIENT PHYSICAL THERAPY TREATMENT   Patient Name: Faith Massey MRN: [id number] DOB:08-25-1945, 78 y.o., female Today's Date: 11/08/2023  END OF SESSION:  PT End of Session - 11/08/23 1101     Visit Number 4    Date for PT Re-Evaluation 01/22/24    Authorization Type [employer]    Progress Note Due on Visit 10    PT Start Time 1102    PT Stop Time 1145    PT Time Calculation (min) 43 min    Activity Tolerance Patient tolerated treatment well    Behavior During Therapy [facility] for tasks assessed/performed            Past Medical History:  Diagnosis Date   Arthritis    Breast CA (HCC)    Chest mass 03/28/2022   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 10/16/2012   Pneumonia    Pre-diabetes    Prediabetes 05/25/2022   Pyelonephritis 11/10/2022   Right knee injury 02/14/2017   S/P radiation therapy 05/09/2023   S/P Robotic Assisted Right Video Thoracoscopy with resection of Thymus 04/25/2022   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   Past Surgical History:  Procedure Laterality Date   ABDOMINAL HYSTERECTOMY  03/22/2003   BACK SURGERY  03/21/1998   BREAST SURGERY     reconstruction   colon susrgery      CYSTOSCOPY WITH STENT PLACEMENT N/A 02/08/2023   Procedure: POSSIBLE CYSTOSCOPY WITH URETERAL STENT PLACEMENT;  Surgeon: Alvaro Ricardo KATHEE Mickey., MD;  Location: [facility];  Service:  Urology;  Laterality: N/A;   LEFT HEART CATH AND CORONARY ANGIOGRAPHY N/A 07/20/2023   Procedure: LEFT HEART CATH AND CORONARY ANGIOGRAPHY;  Surgeon: Verlin Lonni BIRCH, MD;  Location: [facility];  Service: Cardiovascular;  Laterality: N/A;   MASTECTOMY Bilateral 03/21/1993   RESECTION OF A THYMOMA     04-25-22   REVISION TOTAL SHOULDER TO REVERSE TOTAL SHOULDER Right 09/07/2023   Procedure: REVISION, REVERSE TOTAL ARTHROPLASTY, SHOULDER;  Surgeon: Melita Drivers, MD;  Location: [facility];  Service: Orthopedics;  Laterality: Right;  HEMI ARTHROPLASTY, BONEGRAFT GLENOID   right total shoulder     Patient Active Problem List   Diagnosis Date Noted   S/P shoulder hemiarthroplasty, 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   Abnormal EKG 07/21/2023   Chest pain 07/20/2023   Preoperative cardiovascular examination 07/19/2023   Arthritis    Breast CA (HCC)    Hyperlipidemia    Laceration of muscle, fascia and tendon of long head of biceps, unspecified arm, initial encounter    Myasthenia gravis (HCC)    Pneumonia    S/P radiation therapy 05/09/2023   Hoarseness of voice 05/09/2023   Diverticular disease 02/08/2023   History of pulmonary embolism 12/22/2022   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   Pyelonephritis 11/10/2022   Prediabetes 05/25/2022   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   S/P Robotic  Assisted Right Video Thoracoscopy with resection of Thymus 04/25/2022   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   Myasthenic syndrome (HCC) 04/12/2022   CVA (cerebral vascular accident) (HCC) 03/28/2022   HTN (hypertension) 03/28/2022   Chest mass 03/28/2022   Right knee injury 02/14/2017   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 10/16/2012   Osteoporosis 10/12/2012    PCP: Watt Harlene BROCKS, MD   REFERRING PROVIDER: Melita Drivers, MD   REFERRING DIAG: [id number] (ICD-10-CM) - Presence of right artificial shoulder joint   THERAPY DIAG:  Acute pain of right shoulder  Stiffness of right shoulder, not elsewhere  classified  Muscle weakness (generalized)  RATIONALE FOR EVALUATION AND TREATMENT: Rehabilitation  ONSET DATE: Post-Op R TSA revision/conversion to hemi-arthroplasty on 09/07/2023   NEXT MD VISIT: 11/29/2023    SUBJECTIVE:                                                                                                                                                                                      SUBJECTIVE STATEMENT: Pt denies pain, does report that she was able to turn on a light this morning.   Hand dominance: Right  PERTINENT HISTORY: MG, h/o radiation (March and May 2024) for mediastinal mass, Breast CA, HTN, OP, R TSA (09/07/23)  PAIN:  Are you having pain? No and Yes: NPRS scale: 0/10  Pain location: R anterior glenoid, scapular region  Pain description: sometime sharp, throbbing, achy  Aggravating factors: reach fwd too much Relieving factors: Ice, medication   PRECAUTIONS:  Shoulder: physical therapist referral - Evaluate and treat per protocol status post revision right reverse total shoulder arthroplasty to CTA hemiarthroplasty, DOS 09/07/23 Patient unfortunately had a fracture at the base of the glenosphere making it unstable and requiring removal and conversion to hemiarthroplasty. Please adjust her physical therapy to be done under a very slow and steady and gradual advance of range of motion and gradual use of the right upper extremity within her pain tolerance. Please call me for any additional questions  RED FLAGS: None   WEIGHT BEARING RESTRICTIONS: No  FALLS:  Has patient fallen in last 6 months? Yes. Number of falls a week and a half ago, feel onto buttock while pulling weeds out of yard   LIVING ENVIRONMENT: Lives with: lives alone Lives in: House/apartment Stairs: No Has following equipment at home: Single point cane, shower chair, Grab bars, and walking stick   OCCUPATION: Retired   PLOF: Independent with household mobility with device and  Leisure: watching tv, reading, walking her dog, social gatherings w/ friends to play card games   PATIENT GOALS: Get my shoulder to functioning, lifting shoulder, bearing weight  OBJECTIVE:  Note: Objective measures were completed at Evaluation unless otherwise noted.  DIAGNOSTIC FINDINGS:  09/20/2022: R shoulder XR  Results: Degenerative changes of the before meals and glenohumeral joints. No other acute findings.   PATIENT SURVEYS :  QuickDASH Score: 79.5 / 100 = 79.5 %  COGNITION: Overall cognitive status: Within functional limits for tasks assessed     SENSATION: WFL  POSTURE: Rounded shoulders, fwd head   UPPER EXTREMITY ROM:  LUE: Grossly WFL   Passive ROM Right eval R 11/02/23 R 11/08/23  Shoulder flexion Limited, p! Less than 90 (around 60) 84 90  Shoulder extension     Shoulder abduction Limited less than 90 74 scaption 80 scaption  Shoulder adduction     Shoulder internal rotation Limited p! 35 at 30 scaption   Shoulder external rotation Limitied p! 45 at 30 scaption 50   (Blank rows = not tested)  Active ROM Right Left  Shoulder flexion    Shoulder extension    Shoulder abduction    Shoulder adduction    Shoulder extension    Shoulder internal rotation    Shoulder external rotation     (Blank rows = not tested)   UPPER EXTREMITY MMT:  MMT Right eval Left eval  Shoulder flexion  4-  Shoulder extension    Shoulder abduction  4+  Shoulder adduction    Shoulder internal rotation  4-  Shoulder external rotation  4-  Middle trapezius    Lower trapezius    Elbow flexion  5  Elbow extension  4+  Wrist flexion    Wrist extension    Wrist ulnar deviation    Wrist radial deviation    Wrist pronation    Wrist supination    Grip strength (lbs)    (Blank rows = not tested)  SHOULDER SPECIAL TESTS: Not Performed   JOINT MOBILITY TESTING:  Not Performed   PALPATION:  TTP: R ant glenoid, collar bone area (tender, not painful)                                                                                                                               TREATMENT DATE:  11/08/2023  THERAPEUTIC EXERCISE: To improve ROM and flexibility.  Demonstration, verbal and tactile cues throughout for technique.  Pulleys: Flexion x 3 min, Scaption x 3 min (scaption better tolerated today) Standing counter wash AAROM LUE: Flexion x 20 Scaption x 20 R shoulder PROM within pain tolerance for flexion, scaption, IR and ER Measured PROM Shoulder isometrics: flex, abd, ER, ext 5x5 Standing YTB scap retraction + B shoulder row- clicking clicking at first but after readjusting no clicking  Standing YTB scap retraction + B shoulder extension stopping shy of neutral x 10 11/06/2023  THERAPEUTIC EXERCISE: To improve ROM and flexibility.  Demonstration, verbal and tactile cues throughout for technique.  Pulleys: Flexion x 3 min, Scaption x 3 min (scaption better tolerated today) Seated R shoulder AAROM  with Swiffer: Flexion 2 x 10 Scaption 2 x 10 R shoulder PROM by PT within pain tolerance for flexion, scaption, IR and ER  Hooklying R shoulder AAROM with cane: Flexion 2 x 10 Scaption x 10 - pt needing PT guidance/assistance for proper movement pattern  NEUROMUSCULAR RE-EDUCATION: To improve coordination, kinesthesia, posture, and proprioception. R shoulder rhythmic stabilization at 90 flexion (1-finger perturbations) 2 x 15-20 sec R shoulder protraction AAROM with wand 2 x 10 Standing YTB scap retraction + B shoulder row x 10 Standing YTB scap retraction + B shoulder extension stopping shy of neutral x 10   11/02/2023  THERAPEUTIC EXERCISE: To improve strength, endurance, ROM, and flexibility.  Demonstration, verbal and tactile cues throughout for technique.  Pulleys: Flexion x 3 min, Scaption x 1.5 min (discontinued d/t increasing discomfort with scaption) Table slides standing at counter top:  Flexion 2 x 10 Scaption 2 x 10 Seated R shoulder AAROM  with Swiffer: Flexion 2 x 10 Scaption 2 x 10 R shoulder PROM by PT within pain tolerance for flexion, scaption, IR and ER (ER limited to ~45 per protocol) Standing R shoulder submax (~25% effort) isometrics into wall 5 x 5 - flexion, abduction, extension, IR & ER Standing scapular retraction 10 x 5  SELF CARE:  Yellow Theraputty provided for home grip strengthening   10/30/2023 SELF CARE:  Reviewed eval findings and role of PT in addressing identified deficits as well as instruction in initial HEP (see below).    PATIENT EDUCATION:  Education details: HEP review, HEP update - submax R shoulder isometrics, scap retraction, and yellow Theraputty provided for home grip strengthening  Person educated: Patient Education method: Explanation, Demonstration, Verbal cues, Tactile cues, and MedBridge[employer] app updated Education comprehension: verbalized understanding, returned demonstration, verbal cues required, tactile cues required, and needs further education   HOME EXERCISE PROGRAM: Access Code: [email or web address] URL: [email or web address] Date: 11/02/2023 Prepared by: Elijah Hidden  Exercises - Seated Shoulder Flexion Towel Slide at Table Top  - 1 x daily - 7 x weekly - 3 sets - 10 reps - Standing Single Arm Shoulder Flexion Towel Slide at Table Top  - 1 x daily - 7 x weekly - 3 sets - 10 reps - Circular Shoulder Pendulum with Table Support  - 1 x daily - 7 x weekly - 3 sets - 10 reps - Isometric Shoulder Flexion at Wall  - 1 x daily - 7 x weekly - 2 sets - 5 reps - 5 sec hold - Isometric Shoulder Abduction at Wall  - 1 x daily - 7 x weekly - 2 sets - 5 reps - 5 sec hold - Isometric Shoulder Extension at Wall  - 1 x daily - 7 x weekly - 2 sets - 5 reps - 5 sec hold - Standing Isometric Shoulder Internal Rotation at Doorway  - 1 x daily - 7 x weekly - 2 sets - 5 reps - 5 sec hold - Standing Isometric Shoulder External Rotation with Doorway  - 1 x daily - 7 x weekly - 2 sets - 5 reps  - 5 sec hold - Standing Scapular Retraction  - 1 x daily - 7 x weekly - 2 sets - 10 reps - 3-5 sec hold   ASSESSMENT:  CLINICAL IMPRESSION: Pt with no increased pain today. Shoulder PROM improving well, met STG for flexion today. I did not send her home with the rows and extensions today as she still noted some clicking and needed much instruction and cues.  She continues to improve little by little.  [place] will benefit from continued skilled PT to address ongoing ROM and strength deficits to improve functional UE use and activity tolerance with decreased pain interference.   EVAL: Anisha Starliper is a 78 y/o F referred to PT for the evaluation and treatment of R shoulder pain s/p R Reverse TSA (08/18/23) and repair of the artificial joint via hemiarthroplasty (09/07/23). Pt reports that she has orders to limit R arm use to waist level only, avoid reaching overhead at this time. She is having difficulty with forward and overhead reaching for items, opening cans w/ R hand, washing hair, and driving. She states that she has learned ways to compensate using her left arm even though she is R hand dominant. Today's assessment was done with all items remaining within patient's tolerance. PROM revealed limitations in flexion, IR, ER, and abd as movement was limited by both pain and muscular guarding. Pt had difficulty fully relaxing the R arm for PROM today and displayed hesitancy to movement beyond waist side. Pt was educated on the importance of taking recovery slow at this point (as advised) and avoid heavy lifting and resistance training after asking to try bicep curls with a small weight at home. Pt is aware that at this stage, it is important to focus of restoring as much ROM as she can and gradually progressing towards more strengthening activities with therapy. Pt scored a 79.5% on the QuickDASH representing severe disability of the UE in day to day activities.  [place] will benefit from skilled PT intervention to  address current deficits to improve functional ability, mobility, and activity tolerance w/ dec'd pain interference.   OBJECTIVE IMPAIRMENTS: decreased activity tolerance, decreased endurance, decreased knowledge of condition, decreased mobility, decreased ROM, decreased strength, increased fascial restrictions, impaired perceived functional ability, increased muscle spasms, impaired UE functional use, postural dysfunction, and pain.   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   REHAB POTENTIAL: Good  CLINICAL DECISION MAKING: Evolving/moderate complexity  EVALUATION COMPLEXITY: Moderate  GOALS: Goals reviewed with patient? Yes  SHORT TERM GOALS: Target date: 12/11/2023    Patient will be independent with initial HEP.  Baseline: Goal status: IN PROGRESS - 11/02/23  2.  Patient will report at least 25% improvement in R shoulder pain to improve QoL. Baseline: Worst 5-7/10 Goal status: INITIAL  3.  Patient will improve R shoulder flexion PROM to 90 or better to aid in difficulty with forward reaching activities.  Baseline: limited by pain ~60 deg of flexion  Goal status: MET- 11/08/23 no pain  4.  Patient will decrease her QuickDASH score by at least 15% to decrease severity of disability.  Baseline: 79.5% Goal status:  INITIAL  LONG TERM GOALS: Target date: 01/22/2024  Patient will be independent with advanced HEP.  Baseline:  Goal status: INITIAL  2.  Patient will  decrease her QuickDASH score to by at least 25-30% to improve function and activity tolerance d/t disability. Baseline: 79.5% Goal status: INITIAL  3.  Patient will be able to perform overhead activities such as washing hair, retrieving items from cabinets to improve  independent function at home. Baseline: pt is having difficulty with above activities  Goal status: INITIAL  4.  Patient will report at least 50% improvement in R shoulder pain to improve QoL.  Baseline: 5-7/10 Goal status: INITIAL  5.  Patient will obtain at least 3+/5 MMT of UE for available range to increase activity tolerance.   Baseline: Unable to assess Goal status: INITIAL     PLAN: PT FREQUENCY: 2x/week  PT DURATION: 12 weeks  PLANNED INTERVENTIONS: 97164- PT Re-evaluation, 97750- Physical Performance Testing, 97110-Therapeutic exercises, 97530- Therapeutic activity, [id number]- Neuromuscular re-education, 97535- Self Care, 02859- Manual therapy, [phone]- Gait training, [phone]- Electrical stimulation (unattended), [phone]- Ultrasound, 02987- Traction (mechanical), [phone] (1-2 muscles), 20561 (3+ muscles)- Dry Needling, Patient/Family education, Taping, Joint mobilization, Cryotherapy, and Moist heat  PLAN FOR NEXT SESSION: Gentle R shoulder P/AAROM within pain free motion; gentle submaximal isometric strengthening (25%, increasing effort within pain free levels); review/revise HEP as indicated; may use Reverse Total Shoulder Arthroplasty for Fracture Protocol for guidance (Sx 09/07/23) - post-op week #8 as of 11/02/23   Marigold Mom L Kymari Lollis, PTA 11/08/2023, 11:55 AM

## 2023-11-09 ENCOUNTER — Ambulatory Visit: Payer: Medicare HMO | Admitting: Family Medicine

## 2023-11-09 ENCOUNTER — Encounter: Payer: Self-pay | Admitting: Family Medicine

## 2023-11-09 VITALS — BP 118/66 | HR 108 | Ht 59.0 in | Wt 115.5 lb

## 2023-11-09 DIAGNOSIS — Z9089 Acquired absence of other organs: Secondary | ICD-10-CM

## 2023-11-09 DIAGNOSIS — G7000 Myasthenia gravis without (acute) exacerbation: Secondary | ICD-10-CM | POA: Diagnosis not present

## 2023-11-09 DIAGNOSIS — R490 Dysphonia: Secondary | ICD-10-CM

## 2023-11-09 DIAGNOSIS — Z923 Personal history of irradiation: Secondary | ICD-10-CM

## 2023-11-09 DIAGNOSIS — D4989 Neoplasm of unspecified behavior of other specified sites: Secondary | ICD-10-CM

## 2023-11-13 ENCOUNTER — Telehealth: Payer: Self-pay | Admitting: Family Medicine

## 2023-11-13 NOTE — Telephone Encounter (Signed)
 Faith Massey- I don't see mention of this is recent note. Did you speak with pt about this?

## 2023-11-13 NOTE — Telephone Encounter (Signed)
 Pt called wanting to know if there is an update on NP following up with MD if the pt is needing Magnesium. Please advise.

## 2023-11-14 ENCOUNTER — Other Ambulatory Visit: Payer: Self-pay

## 2023-11-14 ENCOUNTER — Ambulatory Visit

## 2023-11-14 DIAGNOSIS — M25511 Pain in right shoulder: Secondary | ICD-10-CM

## 2023-11-14 DIAGNOSIS — M25611 Stiffness of right shoulder, not elsewhere classified: Secondary | ICD-10-CM

## 2023-11-14 DIAGNOSIS — R252 Cramp and spasm: Secondary | ICD-10-CM

## 2023-11-14 DIAGNOSIS — M6281 Muscle weakness (generalized): Secondary | ICD-10-CM

## 2023-11-14 NOTE — Therapy (Unsigned)
 OUTPATIENT PHYSICAL THERAPY TREATMENT   Patient Name: Faith Massey MRN: [id number] DOB:April 04, 1945, 78 y.o., female Today's Date: 11/15/2023  END OF SESSION:  PT End of Session - 11/14/23 1110     Visit Number 5    Date for PT Re-Evaluation 01/22/24    Authorization Type [employer]    Progress Note Due on Visit 10    PT Start Time 1105    PT Stop Time 1145    PT Time Calculation (min) 40 min    Activity Tolerance Patient tolerated treatment well    Behavior During Therapy [facility] for tasks assessed/performed             Past Medical History:  Diagnosis Date   Arthritis    Breast CA (HCC)    Chest mass 03/28/2022   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 10/16/2012   Pneumonia    Pre-diabetes    Prediabetes 05/25/2022   Pyelonephritis 11/10/2022   Right knee injury 02/14/2017   S/P radiation therapy 05/09/2023   S/P Robotic Assisted Right Video Thoracoscopy with resection of Thymus 04/25/2022   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   Past Surgical History:  Procedure Laterality Date   ABDOMINAL HYSTERECTOMY  03/22/2003   BACK SURGERY  03/21/1998   BREAST SURGERY     reconstruction   colon susrgery      CYSTOSCOPY WITH STENT PLACEMENT N/A 02/08/2023   Procedure: POSSIBLE CYSTOSCOPY WITH URETERAL STENT PLACEMENT;  Surgeon: Alvaro Ricardo KATHEE Mickey., MD;  Location: [facility];  Service:  Urology;  Laterality: N/A;   LEFT HEART CATH AND CORONARY ANGIOGRAPHY N/A 07/20/2023   Procedure: LEFT HEART CATH AND CORONARY ANGIOGRAPHY;  Surgeon: Verlin Lonni BIRCH, MD;  Location: [facility];  Service: Cardiovascular;  Laterality: N/A;   MASTECTOMY Bilateral 03/21/1993   RESECTION OF A THYMOMA     04-25-22   REVISION TOTAL SHOULDER TO REVERSE TOTAL SHOULDER Right 09/07/2023   Procedure: REVISION, REVERSE TOTAL ARTHROPLASTY, SHOULDER;  Surgeon: Melita Drivers, MD;  Location: [facility];  Service: Orthopedics;  Laterality: Right;  HEMI ARTHROPLASTY, BONEGRAFT GLENOID   right total shoulder     Patient Active Problem List   Diagnosis Date Noted   S/P shoulder hemiarthroplasty, 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   Abnormal EKG 07/21/2023   Chest pain 07/20/2023   Preoperative cardiovascular examination 07/19/2023   Arthritis    Breast CA (HCC)    Hyperlipidemia    Laceration of muscle, fascia and tendon of long head of biceps, unspecified arm, initial encounter    Myasthenia gravis (HCC)    Pneumonia    S/P radiation therapy 05/09/2023   Hoarseness of voice 05/09/2023   Diverticular disease 02/08/2023   History of pulmonary embolism 12/22/2022   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   Pyelonephritis 11/10/2022   Prediabetes 05/25/2022   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   S/P  Robotic Assisted Right Video Thoracoscopy with resection of Thymus 04/25/2022   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   Myasthenic syndrome (HCC) 04/12/2022   CVA (cerebral vascular accident) (HCC) 03/28/2022   HTN (hypertension) 03/28/2022   Chest mass 03/28/2022   Right knee injury 02/14/2017   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 10/16/2012   Osteoporosis 10/12/2012    PCP: Watt Harlene BROCKS, MD   REFERRING PROVIDER: Melita Drivers, MD   REFERRING DIAG: [phone] (ICD-10-CM) - Presence of right artificial shoulder joint   THERAPY DIAG:  Acute pain of right shoulder  Stiffness of right shoulder, not elsewhere  classified  Muscle weakness (generalized)  Cramp and spasm  RATIONALE FOR EVALUATION AND TREATMENT: Rehabilitation  ONSET DATE: Post-Op R TSA revision/conversion to hemi-arthroplasty on 09/07/2023   NEXT MD VISIT: 11/29/2023    SUBJECTIVE:                                                                                                                                                                                      SUBJECTIVE STATEMENT: Pt reports a little twinge with lifting R shoulder this weekend Hand dominance: Right  PERTINENT HISTORY: MG, h/o radiation (March and May 2024) for mediastinal mass, Breast CA, HTN, OP, R TSA (09/07/23)  PAIN:  Are you having pain? No and Yes: NPRS scale: 0/10  Pain location: R anterior glenoid, scapular region  Pain description: sometime sharp, throbbing, achy  Aggravating factors: reach fwd too much Relieving factors: Ice, medication   PRECAUTIONS:  Shoulder: physical therapist referral - Evaluate and treat per protocol status post revision right reverse total shoulder arthroplasty to CTA hemiarthroplasty, DOS 09/07/23 Patient unfortunately had a fracture at the base of the glenosphere making it unstable and requiring removal and conversion to hemiarthroplasty. Please adjust her physical therapy to be done under a very slow and steady and gradual advance of range of motion and gradual use of the right upper extremity within her pain tolerance. Please call me for any additional questions  RED FLAGS: None   WEIGHT BEARING RESTRICTIONS: No  FALLS:  Has patient fallen in last 6 months? Yes. Number of falls a week and a half ago, feel onto buttock while pulling weeds out of yard   LIVING ENVIRONMENT: Lives with: lives alone Lives in: House/apartment Stairs: No Has following equipment at home: Single point cane, shower chair, Grab bars, and walking stick   OCCUPATION: Retired   PLOF: Independent with household mobility with device and  Leisure: watching tv, reading, walking her dog, social gatherings w/ friends to play card games   PATIENT GOALS: Get my shoulder to functioning, lifting shoulder, bearing weight   OBJECTIVE:  Note: Objective measures were completed at Evaluation unless otherwise noted.  DIAGNOSTIC FINDINGS:  09/20/2022: R shoulder XR  Results: Degenerative changes of the before meals and glenohumeral joints. No other acute findings.   PATIENT SURVEYS :  QuickDASH Score: 79.5 / 100 = 79.5 %  COGNITION: Overall cognitive status: Within functional limits for tasks assessed     SENSATION: WFL  POSTURE: Rounded shoulders, fwd head   UPPER EXTREMITY ROM:  LUE: Grossly WFL   Passive ROM Right eval R 11/02/23 R 11/08/23  Shoulder flexion Limited, p! Less than 90 (around 60) 84 90  Shoulder extension     Shoulder abduction Limited less than 90 74 scaption 80 scaption  Shoulder adduction     Shoulder internal rotation Limited p! 35 at 30 scaption   Shoulder external rotation Limitied p! 45 at 30 scaption 50   (Blank rows = not tested)  Active ROM Right Left  Shoulder flexion    Shoulder extension    Shoulder abduction    Shoulder adduction    Shoulder extension    Shoulder internal rotation    Shoulder external rotation     (Blank rows = not tested)   UPPER EXTREMITY MMT:  MMT Right eval Left eval  Shoulder flexion  4-  Shoulder extension    Shoulder abduction  4+  Shoulder adduction    Shoulder internal rotation  4-  Shoulder external rotation  4-  Middle trapezius    Lower trapezius    Elbow flexion  5  Elbow extension  4+  Wrist flexion    Wrist extension    Wrist ulnar deviation    Wrist radial deviation    Wrist pronation    Wrist supination    Grip strength (lbs)    (Blank rows = not tested)  SHOULDER SPECIAL TESTS: Not Performed   JOINT MOBILITY TESTING:  Not Performed   PALPATION:  TTP: R ant glenoid, collar bone area (tender, not painful)                                                                                                                               TREATMENT DATE:  11/14/23: Pulleys 3 min flexion B Supine for gentle stretching R shoulder all planes Supine with R humerus supported in line with trunk for manually resisted R shoulder isometric inv/ev mass practice Side lying L for R shoulder ER maintaining towel roll under axilla and R elbow at 90 degrees Standing with R elbow supported on counter, pulling towel with 6# on towel from flexed position to neutral, then advance to sitting in chair for yellow t band rows, needed frequent cues to avoid R shoulder extension past trunk, had pt slide back in chair so that the seat back blocked her from extending R shoulder   11/08/2023  THERAPEUTIC EXERCISE: To improve ROM and flexibility.  Demonstration, verbal and tactile cues throughout for technique.  Pulleys: Flexion x 3 min, Scaption x  3 min (scaption better tolerated today) Standing counter wash AAROM LUE: Flexion x 20 Scaption x 20 R shoulder PROM within pain tolerance for flexion, scaption, IR and ER Measured PROM Shoulder isometrics: flex, abd, ER, ext 5x5 Standing YTB scap retraction + B shoulder row- clicking clicking at first but after readjusting no clicking  Standing YTB scap retraction + B shoulder extension stopping shy of neutral x 10 11/06/2023  THERAPEUTIC EXERCISE: To improve ROM and flexibility.  Demonstration, verbal and tactile cues throughout for technique.  Pulleys: Flexion x 3 min, Scaption x 3 min (scaption better tolerated today) Seated R shoulder AAROM with Swiffer: Flexion 2 x 10 Scaption 2 x 10 R shoulder PROM by PT within pain tolerance for flexion, scaption, IR and ER  Hooklying R shoulder AAROM with cane: Flexion 2 x 10 Scaption x 10 - pt needing PT guidance/assistance for proper movement pattern  NEUROMUSCULAR RE-EDUCATION: To improve coordination, kinesthesia, posture, and proprioception. R shoulder  rhythmic stabilization at 90 flexion (1-finger perturbations) 2 x 15-20 sec R shoulder protraction AAROM with wand 2 x 10 Standing YTB scap retraction + B shoulder row x 10 Standing YTB scap retraction + B shoulder extension stopping shy of neutral x 10   11/02/2023  THERAPEUTIC EXERCISE: To improve strength, endurance, ROM, and flexibility.  Demonstration, verbal and tactile cues throughout for technique.  Pulleys: Flexion x 3 min, Scaption x 1.5 min (discontinued d/t increasing discomfort with scaption) Table slides standing at counter top:  Flexion 2 x 10 Scaption 2 x 10 Seated R shoulder AAROM with Swiffer: Flexion 2 x 10 Scaption 2 x 10 R shoulder PROM by PT within pain tolerance for flexion, scaption, IR and ER (ER limited to ~45 per protocol) Standing R shoulder submax (~25% effort) isometrics into wall 5 x 5 - flexion, abduction, extension, IR & ER Standing scapular retraction 10 x 5  SELF CARE:  Yellow Theraputty provided for home grip strengthening   10/30/2023 SELF CARE:  Reviewed eval findings and role of PT in addressing identified deficits as well as instruction in initial HEP (see below).    PATIENT EDUCATION:  Education details: HEP review, HEP update - submax R shoulder isometrics, scap retraction, and yellow Theraputty provided for home grip strengthening  Person educated: Patient Education method: Explanation, Demonstration, Verbal cues, Tactile cues, and MedBridge[employer] app updated Education comprehension: verbalized understanding, returned demonstration, verbal cues required, tactile cues required, and needs further education   HOME EXERCISE PROGRAM: Access Code: [email or web address] URL: [email or web address] Date: 11/14/2023 Prepared by: Ladarian Bonczek  Exercises - Seated Shoulder Flexion Towel Slide at Table Top  - 1 x daily - 7 x weekly - 3 sets - 10 reps - Standing Single Arm Shoulder Flexion Towel Slide at Table Top  - 1 x daily - 7 x weekly - 3 sets  - 10 reps - Circular Shoulder Pendulum with Table Support  - 1 x daily - 7 x weekly - 3 sets - 10 reps - Isometric Shoulder Flexion at Wall  - 1 x daily - 7 x weekly - 2 sets - 5 reps - 5 sec hold - Isometric Shoulder Abduction at Wall  - 1 x daily - 7 x weekly - 2 sets - 5 reps - 5 sec hold - Isometric Shoulder Extension at Wall  - 1 x daily - 7 x weekly - 2 sets - 5 reps - 5 sec hold - Standing Isometric Shoulder Internal Rotation at Doorway  - 1 x daily - 7 x  weekly - 2 sets - 5 reps - 5 sec hold - Standing Isometric Shoulder External Rotation with Doorway  - 1 x daily - 7 x weekly - 2 sets - 5 reps - 5 sec hold - Standing Scapular Retraction  - 1 x daily - 7 x weekly - 2 sets - 10 reps - 3-5 sec hold - Sidelying Shoulder External Rotation  - 1 x daily - 7 x weekly - 3 sets - 10 reps Access Code: [email or web address] URL: [email or web address] Date: 11/02/2023 Prepared by: Elijah Hidden  Exercises - Seated Shoulder Flexion Towel Slide at Table Top  - 1 x daily - 7 x weekly - 3 sets - 10 reps - Standing Single Arm Shoulder Flexion Towel Slide at Table Top  - 1 x daily - 7 x weekly - 3 sets - 10 reps - Circular Shoulder Pendulum with Table Support  - 1 x daily - 7 x weekly - 3 sets - 10 reps - Isometric Shoulder Flexion at Wall  - 1 x daily - 7 x weekly - 2 sets - 5 reps - 5 sec hold - Isometric Shoulder Abduction at Wall  - 1 x daily - 7 x weekly - 2 sets - 5 reps - 5 sec hold - Isometric Shoulder Extension at Wall  - 1 x daily - 7 x weekly - 2 sets - 5 reps - 5 sec hold - Standing Isometric Shoulder Internal Rotation at Doorway  - 1 x daily - 7 x weekly - 2 sets - 5 reps - 5 sec hold - Standing Isometric Shoulder External Rotation with Doorway  - 1 x daily - 7 x weekly - 2 sets - 5 reps - 5 sec hold - Standing Scapular Retraction  - 1 x daily - 7 x weekly - 2 sets - 10 reps - 3-5 sec hold   ASSESSMENT:  CLINICAL IMPRESSION: Pt with mild increased pain today initially . Any ex to  engage her scapular retractors, she seems to have difficulty monitoring the position of her R humerus and overextends, so again did not send the t band home with her.  Followed protocol in regard to her exercise progression today.  Audrena will benefit from continued skilled PT to address ongoing ROM and strength deficits to improve functional UE use and activity tolerance with decreased pain interference.   EVAL: Eman Rynders is a 78 y/o F referred to PT for the evaluation and treatment of R shoulder pain s/p R Reverse TSA (08/18/23) and repair of the artificial joint via hemiarthroplasty (09/07/23). Pt reports that she has orders to limit R arm use to waist level only, avoid reaching overhead at this time. She is having difficulty with forward and overhead reaching for items, opening cans w/ R hand, washing hair, and driving. She states that she has learned ways to compensate using her left arm even though she is R hand dominant. Today's assessment was done with all items remaining within patient's tolerance. PROM revealed limitations in flexion, IR, ER, and abd as movement was limited by both pain and muscular guarding. Pt had difficulty fully relaxing the R arm for PROM today and displayed hesitancy to movement beyond waist side. Pt was educated on the importance of taking recovery slow at this point (as advised) and avoid heavy lifting and resistance training after asking to try bicep curls with a small weight at home. Pt is aware that at this stage, it is important to focus of restoring as much ROM as  she can and gradually progressing towards more strengthening activities with therapy. Pt scored a 79.5% on the QuickDASH representing severe disability of the UE in day to day activities.  Karrah will benefit from skilled PT intervention to address current deficits to improve functional ability, mobility, and activity tolerance w/ dec'd pain interference.   OBJECTIVE IMPAIRMENTS: decreased activity tolerance,  decreased endurance, decreased knowledge of condition, decreased mobility, decreased ROM, decreased strength, increased fascial restrictions, impaired perceived functional ability, increased muscle spasms, impaired UE functional use, postural dysfunction, and pain.   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   REHAB POTENTIAL: Good  CLINICAL DECISION MAKING: Evolving/moderate complexity  EVALUATION COMPLEXITY: Moderate  GOALS: Goals reviewed with patient? Yes  SHORT TERM GOALS: Target date: 12/11/2023    Patient will be independent with initial HEP.  Baseline: Goal status: IN PROGRESS - 11/02/23  2.  Patient will report at least 25% improvement in R shoulder pain to improve QoL. Baseline: Worst 5-7/10 Goal status: INITIAL  3.  Patient will improve R shoulder flexion PROM to 90 or better to aid in difficulty with forward reaching activities.  Baseline: limited by pain ~60 deg of flexion  Goal status: MET- 11/08/23 no pain  4.  Patient will decrease her QuickDASH score by at least 15% to decrease severity of disability.  Baseline: 79.5% Goal status:  INITIAL  LONG TERM GOALS: Target date: 01/22/2024  Patient will be independent with advanced HEP.  Baseline:  Goal status: INITIAL  2.  Patient will decrease her QuickDASH score to by at least 25-30% to improve function and activity tolerance d/t disability. Baseline: 79.5% Goal status: INITIAL  3.  Patient will be able  to perform overhead activities such as washing hair, retrieving items from cabinets to improve independent function at home. Baseline: pt is having difficulty with above activities  Goal status: INITIAL  4.  Patient will report at least 50% improvement in R shoulder pain to improve QoL.  Baseline: 5-7/10 Goal status: INITIAL  5.  Patient will obtain at least 3+/5 MMT of UE for available range to increase activity tolerance.   Baseline: Unable to assess Goal status: INITIAL     PLAN: PT FREQUENCY: 2x/week  PT DURATION: 12 weeks  PLANNED INTERVENTIONS: [id number]- PT Re-evaluation, 97750- Physical Performance Testing, 97110-Therapeutic exercises, 97530- Therapeutic activity, [id number]- Neuromuscular re-education, 97535- Self Care, [id number]- Manual therapy, [id number]- Gait training, [phone]- Electrical stimulation (unattended), [id number]- Ultrasound, 02987- Traction (mechanical), 20560 (1-2 muscles), 20561 (3+ muscles)- Dry Needling, Patient/Family education, Taping, Joint mobilization, Cryotherapy, and Moist heat  PLAN FOR NEXT SESSION: Gentle R shoulder P/AAROM within pain free motion; gentle submaximal isometric strengthening (25%, increasing effort within pain free levels); review/revise HEP as indicated; may use Reverse Total Shoulder Arthroplasty for Fracture Protocol for guidance (Sx 09/07/23) - post-op week #8 as of 11/02/23   Jeryn Bertoni L Elisse Pennick, PT, DPT, OCS 11/15/2023, 5:52 PM

## 2023-11-17 ENCOUNTER — Ambulatory Visit: Admitting: Physical Therapy

## 2023-11-17 DIAGNOSIS — M6281 Muscle weakness (generalized): Secondary | ICD-10-CM

## 2023-11-17 DIAGNOSIS — M25511 Pain in right shoulder: Secondary | ICD-10-CM | POA: Diagnosis not present

## 2023-11-17 DIAGNOSIS — R252 Cramp and spasm: Secondary | ICD-10-CM

## 2023-11-17 DIAGNOSIS — M25611 Stiffness of right shoulder, not elsewhere classified: Secondary | ICD-10-CM

## 2023-11-17 NOTE — Therapy (Signed)
 OUTPATIENT PHYSICAL THERAPY TREATMENT   Patient Name: Faith Massey MRN: [id number] DOB:06-03-1945, 78 y.o., female Today's Date: 11/17/2023  END OF SESSION:  PT End of Session - 11/17/23 1021     Visit Number 6    Date for PT Re-Evaluation 01/22/24    Authorization Type [employer]    Progress Note Due on Visit 10    PT Start Time 1021    PT Stop Time 1119    PT Time Calculation (min) 58 min    Activity Tolerance Patient tolerated treatment well    Behavior During Therapy [facility] for tasks assessed/performed              Past Medical History:  Diagnosis Date   Arthritis    Breast CA (HCC)    Chest mass 03/28/2022   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 10/16/2012   Pneumonia    Pre-diabetes    Prediabetes 05/25/2022   Pyelonephritis 11/10/2022   Right knee injury 02/14/2017   S/P radiation therapy 05/09/2023   S/P Robotic Assisted Right Video Thoracoscopy with resection of Thymus 04/25/2022   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   Past Surgical History:  Procedure Laterality Date   ABDOMINAL HYSTERECTOMY  03/22/2003   BACK SURGERY  03/21/1998   BREAST SURGERY     reconstruction   colon susrgery      CYSTOSCOPY WITH STENT PLACEMENT N/A 02/08/2023   Procedure: POSSIBLE CYSTOSCOPY WITH URETERAL STENT PLACEMENT;  Surgeon: Alvaro Ricardo KATHEE Mickey., MD;  Location: [facility];   Service: Urology;  Laterality: N/A;   LEFT HEART CATH AND CORONARY ANGIOGRAPHY N/A 07/20/2023   Procedure: LEFT HEART CATH AND CORONARY ANGIOGRAPHY;  Surgeon: Verlin Lonni BIRCH, MD;  Location: [facility];  Service: Cardiovascular;  Laterality: N/A;   MASTECTOMY Bilateral 03/21/1993   RESECTION OF A THYMOMA     04-25-22   REVISION TOTAL SHOULDER TO REVERSE TOTAL SHOULDER Right 09/07/2023   Procedure: REVISION, REVERSE TOTAL ARTHROPLASTY, SHOULDER;  Surgeon: Melita Drivers, MD;  Location: [facility];  Service: Orthopedics;  Laterality: Right;  HEMI ARTHROPLASTY, BONEGRAFT GLENOID   right total shoulder     Patient Active Problem List   Diagnosis Date Noted   S/P shoulder hemiarthroplasty, 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   Abnormal EKG 07/21/2023   Chest pain 07/20/2023   Preoperative cardiovascular examination 07/19/2023   Arthritis    Breast CA (HCC)    Hyperlipidemia    Laceration of muscle, fascia and tendon of long head of biceps, unspecified arm, initial encounter    Myasthenia gravis (HCC)    Pneumonia    S/P radiation therapy 05/09/2023   Hoarseness of voice 05/09/2023   Diverticular disease 02/08/2023   History of pulmonary embolism 12/22/2022   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   Pyelonephritis 11/10/2022   Prediabetes 05/25/2022   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  S/P Robotic Assisted Right Video Thoracoscopy with resection of Thymus 04/25/2022   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   Myasthenic syndrome (HCC) 04/12/2022   CVA (cerebral vascular accident) (HCC) 03/28/2022   HTN (hypertension) 03/28/2022   Chest mass 03/28/2022   Right knee injury 02/14/2017   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 10/16/2012   Osteoporosis 10/12/2012    PCP: Watt Harlene BROCKS, MD   REFERRING PROVIDER: Melita Drivers, MD   REFERRING DIAG: [phone] (ICD-10-CM) - Presence of right artificial shoulder joint   THERAPY DIAG:  Acute pain of right shoulder  Stiffness of right shoulder, not  elsewhere classified  Muscle weakness (generalized)  Cramp and spasm  RATIONALE FOR EVALUATION AND TREATMENT: Rehabilitation  ONSET DATE: Post-Op R TSA revision/conversion to hemi-arthroplasty on 09/07/2023   NEXT MD VISIT: 11/29/2023    SUBJECTIVE:                                                                                                                                                                                      SUBJECTIVE STATEMENT: Pt reports her incision still feels like something is poking her like a needle.  Hand dominance: Right  PERTINENT HISTORY: MG, h/o radiation (March and May 2024) for mediastinal mass, Breast CA, HTN, OP, R TSA (09/07/23)  PAIN:  Are you having pain? No and Yes: NPRS scale: 0/10  Pain location: R anterior glenoid, scapular region  Pain description: sometime sharp, throbbing, achy  Aggravating factors: reach fwd too much Relieving factors: Ice, medication   PRECAUTIONS:  Shoulder: physical therapist referral - Evaluate and treat per protocol status post revision right reverse total shoulder arthroplasty to CTA hemiarthroplasty, DOS 09/07/23 Patient unfortunately had a fracture at the base of the glenosphere making it unstable and requiring removal and conversion to hemiarthroplasty. Please adjust her physical therapy to be done under a very slow and steady and gradual advance of range of motion and gradual use of the right upper extremity within her pain tolerance. Please call me for any additional questions  RED FLAGS: None   WEIGHT BEARING RESTRICTIONS: No  FALLS:  Has patient fallen in last 6 months? Yes. Number of falls a week and a half ago, feel onto buttock while pulling weeds out of yard   LIVING ENVIRONMENT: Lives with: lives alone Lives in: House/apartment Stairs: No Has following equipment at home: Single point cane, shower chair, Grab bars, and walking stick   OCCUPATION: Retired   PLOF: Independent with household  mobility with device and Leisure: watching tv, reading, walking her dog, social gatherings w/ friends to play card games   PATIENT GOALS: Get my shoulder to functioning, lifting shoulder,  bearing weight   OBJECTIVE:  Note: Objective measures were completed at Evaluation unless otherwise noted.  DIAGNOSTIC FINDINGS:  09/20/2022: R shoulder XR  Results: Degenerative changes of the before meals and glenohumeral joints. No other acute findings.   PATIENT SURVEYS :  QuickDASH Score: 79.5 / 100 = 79.5 %  COGNITION: Overall cognitive status: Within functional limits for tasks assessed     SENSATION: WFL  POSTURE: Rounded shoulders, fwd head   UPPER EXTREMITY ROM:  LUE: Grossly WFL   Passive ROM Right eval R 11/02/23 R 11/08/23  Shoulder flexion Limited, p! Less than 90 (around 60) 84 90  Shoulder extension     Shoulder abduction Limited less than 90 74 scaption 80 scaption  Shoulder adduction     Shoulder internal rotation Limited p! 35 at 30 scaption   Shoulder external rotation Limitied p! 45 at 30 scaption 50   (Blank rows = not tested)  Active ROM Right Left  Shoulder flexion    Shoulder extension    Shoulder abduction    Shoulder adduction    Shoulder extension    Shoulder internal rotation    Shoulder external rotation     (Blank rows = not tested)   UPPER EXTREMITY MMT:  MMT Right eval Left eval  Shoulder flexion  4-  Shoulder extension    Shoulder abduction  4+  Shoulder adduction    Shoulder internal rotation  4-  Shoulder external rotation  4-  Middle trapezius    Lower trapezius    Elbow flexion  5  Elbow extension  4+  Wrist flexion    Wrist extension    Wrist ulnar deviation    Wrist radial deviation    Wrist pronation    Wrist supination    Grip strength (lbs)    (Blank rows = not tested)  SHOULDER SPECIAL TESTS: Not Performed   JOINT MOBILITY TESTING:  Not Performed   PALPATION:  TTP: R ant glenoid, collar bone area (tender, not  painful)                                                                                                                              TREATMENT DATE:   11/17/2023  THERAPEUTIC EXERCISE: To improve ROM and flexibility.  Demonstration, verbal and tactile cues throughout for technique.  Pulleys: Flexion & Scaption x 3 min each  Supine for gentle R shoulder PROM/stretching all planes Supine flexed elbow R shoulder flexion/scaption (uppercut) AA/AROM 2 x 10 - AAROM d/t increased discomfort with AROM  MANUAL THERAPY: To promote normalized muscle tension, improved flexibility, improved joint mobility, and reduced pain utilizing connective tissue massage, therapeutic massage, manual TP therapy, and scar mobilization.  STM/XFM to R shoulder surgical incision STM/DTM and manual TPR to R pecs  NEUROMUSCULAR RE-EDUCATION: To improve coordination, kinesthesia, and posture. S/L R shoulder ER maintaining towel roll under axilla and R elbow at 90 degrees 2 x 10 - VC & TC  for scapular engagement S/L R shoulder flexion - initially attempted with elbow straight but transitioned to R elbow at 90 degrees x 10 Seated YTB scap retraction + B shoulder row x 10 Seated YTB scap retraction + B shoulder extension stopping shy of neutral x 10   11/14/23: Pulleys 3 min flexion B Supine for gentle stretching R shoulder all planes Supine with R humerus supported in line with trunk for manually resisted R shoulder isometric inv/ev mass practice Side lying L for R shoulder ER maintaining towel roll under axilla and R elbow at 90 degrees Standing with R elbow supported on counter, pulling towel with 6# on towel from flexed position to neutral, then advance to sitting in chair for yellow t band rows, needed frequent cues to avoid R shoulder extension past trunk, had pt slide back in chair so that the seat back blocked her from extending R shoulder    11/08/2023  THERAPEUTIC EXERCISE: To improve ROM and flexibility.   Demonstration, verbal and tactile cues throughout for technique.  Pulleys: Flexion x 3 min, Scaption x 3 min (scaption better tolerated today) Standing counter wash AAROM LUE: Flexion x 20 Scaption x 20 R shoulder PROM within pain tolerance for flexion, scaption, IR and ER Measured PROM Shoulder isometrics: flex, abd, ER, ext 5x5 Standing YTB scap retraction + B shoulder row- clicking clicking at first but after readjusting no clicking  Standing YTB scap retraction + B shoulder extension stopping shy of neutral x 10   11/06/2023  THERAPEUTIC EXERCISE: To improve ROM and flexibility.  Demonstration, verbal and tactile cues throughout for technique.  Pulleys: Flexion x 3 min, Scaption x 3 min (scaption better tolerated today) Seated R shoulder AAROM with Swiffer: Flexion 2 x 10 Scaption 2 x 10 R shoulder PROM by PT within pain tolerance for flexion, scaption, IR and ER  Hooklying R shoulder AAROM with cane: Flexion 2 x 10 Scaption x 10 - pt needing PT guidance/assistance for proper movement pattern  NEUROMUSCULAR RE-EDUCATION: To improve coordination, kinesthesia, posture, and proprioception. R shoulder rhythmic stabilization at 90 flexion (1-finger perturbations) 2 x 15-20 sec R shoulder protraction AAROM with wand 2 x 10 Standing YTB scap retraction + B shoulder row x 10 Standing YTB scap retraction + B shoulder extension stopping shy of neutral x 10   11/02/2023  THERAPEUTIC EXERCISE: To improve strength, endurance, ROM, and flexibility.  Demonstration, verbal and tactile cues throughout for technique.  Pulleys: Flexion x 3 min, Scaption x 1.5 min (discontinued d/t increasing discomfort with scaption) Table slides standing at counter top:  Flexion 2 x 10 Scaption 2 x 10 Seated R shoulder AAROM with Swiffer: Flexion 2 x 10 Scaption 2 x 10 R shoulder PROM by PT within pain tolerance for flexion, scaption, IR and ER (ER limited to ~45 per protocol) Standing R shoulder  submax (~25% effort) isometrics into wall 5 x 5 - flexion, abduction, extension, IR & ER Standing scapular retraction 10 x 5  SELF CARE:  Yellow Theraputty provided for home grip strengthening   10/30/2023 SELF CARE:  Reviewed eval findings and role of PT in addressing identified deficits as well as instruction in initial HEP (see below).    PATIENT EDUCATION:  Education details: HEP review, HEP update - S/L shoulder flexion and YTB scap retraction + rows/extension, and HEP modification - HEP frequency adjustment  Person educated: Patient Education method: Explanation, Demonstration, Verbal cues, Tactile cues, and MedBridge[employer] app updated Education comprehension: verbalized understanding, returned demonstration, verbal cues required,  tactile cues required, and needs further education  HOME EXERCISE PROGRAM: Access Code: [email or web address] URL: [email or web address] Date: 11/17/2023 Prepared by: Elijah Hidden  Exercises - Seated Shoulder Flexion Towel Slide at Table Top  - 1 x daily - 7 x weekly - 3 sets - 10 reps - Standing Single Arm Shoulder Flexion Towel Slide at Table Top  - 1 x daily - 7 x weekly - 3 sets - 10 reps - Circular Shoulder Pendulum with Table Support  - 1 x daily - 7 x weekly - 3 sets - 10 reps - Isometric Shoulder Flexion at Wall  - 1 x daily - 3 x weekly - 2 sets - 5 reps - 5 sec hold - Isometric Shoulder Abduction at Wall  - 1 x daily - 3 x weekly - 2 sets - 5 reps - 5 sec hold - Isometric Shoulder Extension at Wall  - 1 x daily - 3 x weekly - 2 sets - 5 reps - 5 sec hold - Standing Isometric Shoulder Internal Rotation at Doorway  - 1 x daily - 3 x weekly - 2 sets - 5 reps - 5 sec hold - Standing Isometric Shoulder External Rotation with Doorway  - 1 x daily - 3 x weekly - 2 sets - 5 reps - 5 sec hold - Standing Scapular Retraction  - 1 x daily - 7 x weekly - 2 sets - 10 reps - 3-5 sec hold - Sidelying Shoulder External Rotation  - 1 x daily - 3 x weekly - 3  sets - 10 reps - Sidelying Shoulder Flexion 15 Degrees  - 1 x daily - 3 x weekly - 2 sets - 10 reps - 3 sec hold - Seated Shoulder Extension and Scapular Retraction with Resistance  - 1 x daily - 3 x weekly - 2 sets - 10 reps - 3-5 sec hold - Seated Shoulder Row with Anchored Resistance  - 1 x daily - 3 x weekly - 2 sets - 10 reps - 3-5 hold hold - Shoulder Scar Massage  - 1-2 x daily - 7 x weekly - 1-2 min hold   ASSESSMENT:  CLINICAL IMPRESSION: Peg reports some increased pain over the past week w/o known trigger.  Adjusted HEP frequency to reduce overuse irritation and reinforced avoid pushing through painful exercises/ROM.  She continues to note restriction related to scar tissue adherence at anterior shoulder incision, therefore reviewed scar massage.  Encouraged continued use of ice as needed to manage pain/soreness.  Better awareness of muscle activation and humeral position during YTB rows/retraction therefore instructions provided for home performance.  Admire will benefit from continued skilled PT to address ongoing ROM and strength deficits to improve functional UE use and activity tolerance with decreased pain interference.   EVAL: Jahara Dail is a 78 y/o F referred to PT for the evaluation and treatment of R shoulder pain s/p R Reverse TSA (08/18/23) and repair of the artificial joint via hemiarthroplasty (09/07/23). Pt reports that she has orders to limit R arm use to waist level only, avoid reaching overhead at this time. She is having difficulty with forward and overhead reaching for items, opening cans w/ R hand, washing hair, and driving. She states that she has learned ways to compensate using her left arm even though she is R hand dominant. Today's assessment was done with all items remaining within patient's tolerance. PROM revealed limitations in flexion, IR, ER, and abd as movement was limited by both pain and muscular guarding.  Pt had difficulty fully relaxing the R arm for PROM today  and displayed hesitancy to movement beyond waist side. Pt was educated on the importance of taking recovery slow at this point (as advised) and avoid heavy lifting and resistance training after asking to try bicep curls with a small weight at home. Pt is aware that at this stage, it is important to focus of restoring as much ROM as she can and gradually progressing towards more strengthening activities with therapy. Pt scored a 79.5% on the QuickDASH representing severe disability of the UE in day to day activities.  Vicke will benefit from skilled PT intervention to address current deficits to improve functional ability, mobility, and activity tolerance w/ dec'd pain interference.   OBJECTIVE IMPAIRMENTS: decreased activity tolerance, decreased endurance, decreased knowledge of condition, decreased mobility, decreased ROM, decreased strength, increased fascial restrictions, impaired perceived functional ability, increased muscle spasms, impaired UE functional use, postural dysfunction, and pain.   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   REHAB POTENTIAL: Good  CLINICAL DECISION MAKING: Evolving/moderate complexity  EVALUATION COMPLEXITY: Moderate  GOALS: Goals reviewed with patient? Yes  SHORT TERM GOALS: Target date: 12/11/2023    Patient will be independent with initial HEP.  Baseline: Goal status: MET - 11/16/23  2.  Patient will report at least 25% improvement in R shoulder  pain to improve QoL. Baseline: Worst 5-7/10 Goal status: IN PROGRESS - 11/17/23 - pt noting shoulder more irritable over past week  3.  Patient will improve R shoulder flexion PROM to 90 or better to aid in difficulty with forward reaching activities.  Baseline: limited by pain ~60 deg of flexion  Goal status: MET - 11/08/23 - no pain  4.  Patient will decrease her QuickDASH score by at least 15% to decrease severity of disability.  Baseline: 79.5% Goal status:  INITIAL  LONG TERM GOALS: Target date: 01/22/2024  Patient will be independent with advanced HEP.  Baseline:  Goal status: INITIAL  2.  Patient will decrease her QuickDASH score to by at least 25-30% to improve function and activity tolerance d/t disability. Baseline: 79.5% Goal status: INITIAL  3.  Patient will be able to perform overhead activities such as washing hair, retrieving items from cabinets to improve independent function at home. Baseline: pt is having difficulty with above activities  Goal status: INITIAL  4.  Patient will report at least 50% improvement in R shoulder pain to improve QoL.  Baseline: 5-7/10 Goal status: INITIAL  5.  Patient will obtain at least 3+/5 MMT of UE for available range to increase activity tolerance.   Baseline: Unable to assess Goal status: INITIAL     PLAN: PT FREQUENCY: 2x/week  PT DURATION: 12 weeks  PLANNED INTERVENTIONS: [id number]- PT Re-evaluation, 97750- Physical Performance Testing, 97110-Therapeutic exercises, 97530- Therapeutic activity, [id number]- Neuromuscular re-education, 97535- Self Care, [id number]- Manual therapy, [id number]- Gait training, [phone]- Electrical stimulation (unattended), [id number]- Ultrasound, 02987- Traction (mechanical), 20560 (1-2 muscles), 20561 (3+ muscles)- Dry Needling, Patient/Family education, Taping, Joint mobilization, Cryotherapy, and Moist heat  PLAN FOR NEXT SESSION: Gentle R shoulder P/AA/AROM within pain free motion; gentle submaximal  isometric  strengthening (25%, increasing effort within pain free levels); review/revise HEP as indicated; may use Reverse Total Shoulder Arthroplasty for Fracture Protocol for guidance (Sx 09/07/23) - post-op week #10 as of 11/16/23   Elijah CHRISTELLA Hidden, PT 11/17/2023, 11:49 AM

## 2023-11-21 ENCOUNTER — Encounter: Payer: Self-pay | Admitting: Physical Therapy

## 2023-11-21 ENCOUNTER — Ambulatory Visit: Attending: Orthopedic Surgery | Admitting: Physical Therapy

## 2023-11-21 DIAGNOSIS — M25511 Pain in right shoulder: Secondary | ICD-10-CM | POA: Insufficient documentation

## 2023-11-21 DIAGNOSIS — M25611 Stiffness of right shoulder, not elsewhere classified: Secondary | ICD-10-CM | POA: Insufficient documentation

## 2023-11-21 DIAGNOSIS — M6281 Muscle weakness (generalized): Secondary | ICD-10-CM | POA: Diagnosis present

## 2023-11-21 DIAGNOSIS — S46211A Strain of muscle, fascia and tendon of other parts of biceps, right arm, initial encounter: Secondary | ICD-10-CM | POA: Diagnosis present

## 2023-11-21 DIAGNOSIS — G8929 Other chronic pain: Secondary | ICD-10-CM | POA: Insufficient documentation

## 2023-11-21 DIAGNOSIS — R252 Cramp and spasm: Secondary | ICD-10-CM | POA: Insufficient documentation

## 2023-11-21 NOTE — Therapy (Signed)
 OUTPATIENT PHYSICAL THERAPY TREATMENT   Patient Name: Faith Massey MRN: [id number] DOB:[birth year], 78 y.o., female Today's Date: 11/21/2023  END OF SESSION:  PT End of Session - 11/21/23 1151     Visit Number 7    Date for PT Re-Evaluation 01/22/24    Authorization Type [employer]    Progress Note Due on Visit 10    PT Start Time 1151    PT Stop Time 1234    PT Time Calculation (min) 43 min    Activity Tolerance Patient tolerated treatment well    Behavior During Therapy [facility] for tasks assessed/performed               Past Medical History:  Diagnosis Date   Arthritis    Breast CA (HCC)    Chest mass 03/28/2022   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 10/16/2012   Pneumonia    Pre-diabetes    Prediabetes 05/25/2022   Pyelonephritis 11/10/2022   Right knee injury 02/14/2017   S/P radiation therapy 05/09/2023   S/P Robotic Assisted Right Video Thoracoscopy with resection of Thymus 04/25/2022   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   Past Surgical History:  Procedure Laterality Date   ABDOMINAL HYSTERECTOMY  03/22/2003   BACK SURGERY  03/21/1998   BREAST SURGERY     reconstruction   colon susrgery      CYSTOSCOPY WITH STENT PLACEMENT N/A 02/08/2023   Procedure: POSSIBLE CYSTOSCOPY WITH URETERAL STENT PLACEMENT;  Surgeon: Alvaro Ricardo KATHEE Mickey., MD;  Location: [facility];   Service: Urology;  Laterality: N/A;   LEFT HEART CATH AND CORONARY ANGIOGRAPHY N/A 07/20/2023   Procedure: LEFT HEART CATH AND CORONARY ANGIOGRAPHY;  Surgeon: Verlin Lonni BIRCH, MD;  Location: [facility];  Service: Cardiovascular;  Laterality: N/A;   MASTECTOMY Bilateral 03/21/1993   RESECTION OF A THYMOMA     04-25-22   REVISION TOTAL SHOULDER TO REVERSE TOTAL SHOULDER Right 09/07/2023   Procedure: REVISION, REVERSE TOTAL ARTHROPLASTY, SHOULDER;  Surgeon: Melita Drivers, MD;  Location: [facility];  Service: Orthopedics;  Laterality: Right;  HEMI ARTHROPLASTY, BONEGRAFT GLENOID   right total shoulder     Patient Active Problem List   Diagnosis Date Noted   S/P shoulder hemiarthroplasty, 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   Abnormal EKG 07/21/2023   Chest pain 07/20/2023   Preoperative cardiovascular examination 07/19/2023   Arthritis    Breast CA (HCC)    Hyperlipidemia    Laceration of muscle, fascia and tendon of long head of biceps, unspecified arm, initial encounter    Myasthenia gravis (HCC)    Pneumonia    S/P radiation therapy 05/09/2023   Hoarseness of voice 05/09/2023   Diverticular disease 02/08/2023   History of pulmonary embolism 12/22/2022   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   Pyelonephritis 11/10/2022   Prediabetes 05/25/2022   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  S/P Robotic Assisted Right Video Thoracoscopy with resection of Thymus 04/25/2022   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   Myasthenic syndrome (HCC) 04/12/2022   CVA (cerebral vascular accident) (HCC) 03/28/2022   HTN (hypertension) 03/28/2022   Chest mass 03/28/2022   Right knee injury 02/14/2017   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 10/16/2012   Osteoporosis 10/12/2012    PCP: Watt Harlene BROCKS, MD   REFERRING PROVIDER: Melita Drivers, MD   REFERRING DIAG: [phone] (ICD-10-CM) - Presence of right artificial shoulder joint   THERAPY DIAG:  Acute pain of right shoulder  Stiffness of right shoulder, not  elsewhere classified  Muscle weakness (generalized)  Cramp and spasm  RATIONALE FOR EVALUATION AND TREATMENT: Rehabilitation  ONSET DATE: Post-Op R TSA revision/conversion to hemi-arthroplasty on 09/07/2023   NEXT MD VISIT: 11/29/2023    SUBJECTIVE:                                                                                                                                                                                      SUBJECTIVE STATEMENT: Pt thinks she slept funny the last few nights and her R shoulder has been more uncomfortable.  She has been working on her scar mobilization.  Hand dominance: Right  PERTINENT HISTORY: MG, h/o radiation (March and May 2024) for mediastinal mass, Breast CA, HTN, OP, R TSA (09/07/23)  PAIN:  Are you having pain? Yes: NPRS scale: intermittently up to 5/10 Pain location: R anterior glenoid, scapular region  Pain description: sometime sharp, throbbing, achy  Aggravating factors: reach fwd too much Relieving factors: Ice, medication   PRECAUTIONS:  Shoulder: physical therapist referral - Evaluate and treat per protocol status post revision right reverse total shoulder arthroplasty to CTA hemiarthroplasty, DOS 09/07/23 Patient unfortunately had a fracture at the base of the glenosphere making it unstable and requiring removal and conversion to hemiarthroplasty. Please adjust her physical therapy to be done under a very slow and steady and gradual advance of range of motion and gradual use of the right upper extremity within her pain tolerance. Please call me for any additional questions  RED FLAGS: None   WEIGHT BEARING RESTRICTIONS: No  FALLS:  Has patient fallen in last 6 months? Yes. Number of falls a week and a half ago, feel onto buttock while pulling weeds out of yard   LIVING ENVIRONMENT: Lives with: lives alone Lives in: House/apartment Stairs: No Has following equipment at home: Single point cane, shower chair, Grab bars, and  walking stick   OCCUPATION: Retired   PLOF: Independent with household mobility with device and Leisure: watching tv, reading, walking her dog, social gatherings w/ friends to play card  games   PATIENT GOALS: Get my shoulder to functioning, lifting shoulder, bearing weight   OBJECTIVE:  Note: Objective measures were completed at Evaluation unless otherwise noted.  DIAGNOSTIC FINDINGS:  09/20/2022: R shoulder XR  Results: Degenerative changes of the before meals and glenohumeral joints. No other acute findings.   PATIENT SURVEYS :  QuickDASH Score: 79.5 / 100 = 79.5 %  COGNITION: Overall cognitive status: Within functional limits for tasks assessed     SENSATION: WFL  POSTURE: Rounded shoulders, fwd head   UPPER EXTREMITY ROM:  LUE: Grossly WFL   Passive ROM Right eval R 11/02/23 R 11/08/23  Shoulder flexion Limited, p! Less than 90 (around 60) 84 90  Shoulder extension     Shoulder abduction Limited less than 90 74 scaption 80 scaption  Shoulder adduction     Shoulder internal rotation Limited p! 35 at 30 scaption   Shoulder external rotation Limitied p! 45 at 30 scaption 50   (Blank rows = not tested)  Active ROM Right Left  Shoulder flexion    Shoulder extension    Shoulder abduction    Shoulder adduction    Shoulder extension    Shoulder internal rotation    Shoulder external rotation     (Blank rows = not tested)   UPPER EXTREMITY MMT:  MMT Right eval Left eval  Shoulder flexion  4-  Shoulder extension    Shoulder abduction  4+  Shoulder adduction    Shoulder internal rotation  4-  Shoulder external rotation  4-  Middle trapezius    Lower trapezius    Elbow flexion  5  Elbow extension  4+  Wrist flexion    Wrist extension    Wrist ulnar deviation    Wrist radial deviation    Wrist pronation    Wrist supination    Grip strength (lbs)    (Blank rows = not tested)  SHOULDER SPECIAL TESTS: Not Performed   JOINT MOBILITY TESTING:  Not  Performed   PALPATION:  TTP: R ant glenoid, collar bone area (tender, not painful)                                                                                                                              TREATMENT DATE:   11/21/2023  THERAPEUTIC EXERCISE: To improve strength, endurance, ROM, and flexibility.  Demonstration, verbal and tactile cues throughout for technique. Pulleys: Flexion & Scaption x 3 min each  Supine for gentle R shoulder PROM/stretching all planes Supine flexed elbow R shoulder flexion/scaption (uppercut) AA/AROM 2 x 10 - somewhat better tolerance for AROM but still slight guiding from PT initially with motion  MANUAL THERAPY: To promote normalized muscle tension, improved flexibility, improved joint mobility, and reduced pain utilizing connective tissue massage, therapeutic massage, manual TP therapy, and scar mobilization.  STM/XFM to R shoulder surgical incision Supine gentle R shoulder oscillation and grade I-II inferior glide for pain relief/muscle relxation  NEUROMUSCULAR RE-EDUCATION:  To improve coordination, kinesthesia, and posture. S/L R shoulder ER maintaining mini-pillow under axilla and R elbow at 90 degrees 2 x 10 - VC & TC for scapular engagement, avoiding shoulder shrug S/L R shoulder flexion - initially attempted with elbow straight but transitioned to R elbow at 90 degrees x 10  SELF CARE:  Provided education on sleeping position and use of pillow(s) to support R arm.    11/17/2023  THERAPEUTIC EXERCISE: To improve ROM and flexibility.  Demonstration, verbal and tactile cues throughout for technique.  Pulleys: Flexion & Scaption x 3 min each  Supine for gentle R shoulder PROM/stretching all planes Supine flexed elbow R shoulder flexion/scaption (uppercut) AA/AROM 2 x 10 - AAROM d/t increased discomfort with AROM  MANUAL THERAPY: To promote normalized muscle tension, improved flexibility, improved joint mobility, and reduced pain utilizing  connective tissue massage, therapeutic massage, manual TP therapy, and scar mobilization.  STM/XFM to R shoulder surgical incision STM/DTM and manual TPR to R pecs  NEUROMUSCULAR RE-EDUCATION: To improve coordination, kinesthesia, and posture. S/L R shoulder ER maintaining towel roll under axilla and R elbow at 90 degrees 2 x 10 - VC & TC for scapular engagement S/L R shoulder flexion - initially attempted with elbow straight but transitioned to R elbow at 90 degrees x 10 Seated YTB scap retraction + B shoulder row x 10 Seated YTB scap retraction + B shoulder extension stopping shy of neutral x 10   11/14/23: Pulleys 3 min flexion B Supine for gentle stretching R shoulder all planes Supine with R humerus supported in line with trunk for manually resisted R shoulder isometric inv/ev mass practice Side lying L for R shoulder ER maintaining towel roll under axilla and R elbow at 90 degrees Standing with R elbow supported on counter, pulling towel with 6# on towel from flexed position to neutral, then advance to sitting in chair for yellow t band rows, needed frequent cues to avoid R shoulder extension past trunk, had pt slide back in chair so that the seat back blocked her from extending R shoulder    11/08/2023  THERAPEUTIC EXERCISE: To improve ROM and flexibility.  Demonstration, verbal and tactile cues throughout for technique.  Pulleys: Flexion x 3 min, Scaption x 3 min (scaption better tolerated today) Standing counter wash AAROM LUE: Flexion x 20 Scaption x 20 R shoulder PROM within pain tolerance for flexion, scaption, IR and ER Measured PROM Shoulder isometrics: flex, abd, ER, ext 5x5 Standing YTB scap retraction + B shoulder row- clicking clicking at first but after readjusting no clicking  Standing YTB scap retraction + B shoulder extension stopping shy of neutral x 10   11/06/2023  THERAPEUTIC EXERCISE: To improve ROM and flexibility.  Demonstration, verbal and tactile cues  throughout for technique.  Pulleys: Flexion x 3 min, Scaption x 3 min (scaption better tolerated today) Seated R shoulder AAROM with Swiffer: Flexion 2 x 10 Scaption 2 x 10 R shoulder PROM by PT within pain tolerance for flexion, scaption, IR and ER  Hooklying R shoulder AAROM with cane: Flexion 2 x 10 Scaption x 10 - pt needing PT guidance/assistance for proper movement pattern  NEUROMUSCULAR RE-EDUCATION: To improve coordination, kinesthesia, posture, and proprioception. R shoulder rhythmic stabilization at 90 flexion (1-finger perturbations) 2 x 15-20 sec R shoulder protraction AAROM with wand 2 x 10 Standing YTB scap retraction + B shoulder row x 10 Standing YTB scap retraction + B shoulder extension stopping shy of neutral x 10   11/02/2023  THERAPEUTIC EXERCISE: To improve strength, endurance, ROM, and flexibility.  Demonstration, verbal and tactile cues throughout for technique.  Pulleys: Flexion x 3 min, Scaption x 1.5 min (discontinued d/t increasing discomfort with scaption) Table slides standing at counter top:  Flexion 2 x 10 Scaption 2 x 10 Seated R shoulder AAROM with Swiffer: Flexion 2 x 10 Scaption 2 x 10 R shoulder PROM by PT within pain tolerance for flexion, scaption, IR and ER (ER limited to ~45 per protocol) Standing R shoulder submax (~25% effort) isometrics into wall 5 x 5 - flexion, abduction, extension, IR & ER Standing scapular retraction 10 x 5  SELF CARE:  Yellow Theraputty provided for home grip strengthening   10/30/2023 SELF CARE:  Reviewed eval findings and role of PT in addressing identified deficits as well as instruction in initial HEP (see below).    PATIENT EDUCATION:  Education details: HEP review, HEP update - S/L shoulder flexion and YTB scap retraction + rows/extension, and HEP modification - HEP frequency adjustment  Person educated: Patient Education method: Explanation, Demonstration, Verbal cues, Tactile cues, and MedBridgeGO  app updated Education comprehension: verbalized understanding, returned demonstration, verbal cues required, tactile cues required, and needs further education  HOME EXERCISE PROGRAM: Access Code: [email or web address] URL: [email or web address] Date: 11/17/2023 Prepared by: Elijah Hidden  Exercises - Seated Shoulder Flexion Towel Slide at Table Top  - 1 x daily - 7 x weekly - 3 sets - 10 reps - Standing Single Arm Shoulder Flexion Towel Slide at Table Top  - 1 x daily - 7 x weekly - 3 sets - 10 reps - Circular Shoulder Pendulum with Table Support  - 1 x daily - 7 x weekly - 3 sets - 10 reps - Isometric Shoulder Flexion at Wall  - 1 x daily - 3 x weekly - 2 sets - 5 reps - 5 sec hold - Isometric Shoulder Abduction at Wall  - 1 x daily - 3 x weekly - 2 sets - 5 reps - 5 sec hold - Isometric Shoulder Extension at Wall  - 1 x daily - 3 x weekly - 2 sets - 5 reps - 5 sec hold - Standing Isometric Shoulder Internal Rotation at Doorway  - 1 x daily - 3 x weekly - 2 sets - 5 reps - 5 sec hold - Standing Isometric Shoulder External Rotation with Doorway  - 1 x daily - 3 x weekly - 2 sets - 5 reps - 5 sec hold - Standing Scapular Retraction  - 1 x daily - 7 x weekly - 2 sets - 10 reps - 3-5 sec hold - Sidelying Shoulder External Rotation  - 1 x daily - 3 x weekly - 3 sets - 10 reps - Sidelying Shoulder Flexion 15 Degrees  - 1 x daily - 3 x weekly - 2 sets - 10 reps - 3 sec hold - Seated Shoulder Extension and Scapular Retraction with Resistance  - 1 x daily - 3 x weekly - 2 sets - 10 reps - 3-5 sec hold - Seated Shoulder Row with Anchored Resistance  - 1 x daily - 3 x weekly - 2 sets - 10 reps - 3-5 hold hold - Shoulder Scar Massage  - 1-2 x daily - 7 x weekly - 1-2 min hold   ASSESSMENT:  CLINICAL IMPRESSION: Theressa reports continued increased pain recently, potentially related to sleeping position.  Education provided on how to support R UE in preferred sleeping positioning in L S/L using  pillows.  She also continues to potentially overuse her R UE, lifting and moving plants, therefore cautioned her to mindful of lifting with her R UE and trying to use predominantly elbow rather than shoulder.  Reinforced continued use of ice as needed to manage pain/soreness.  R shoulder motion remains limited by pain and tightness as well as incisional scr adhesions.  Shaima will benefit from continued skilled PT to address ongoing ROM and strength deficits to improve functional UE use and activity tolerance with decreased pain interference.   EVAL: Farin Buhman is a 78 y/o F referred to PT for the evaluation and treatment of R shoulder pain s/p R Reverse TSA (08/18/23) and repair of the artificial joint via hemiarthroplasty (09/07/23). Pt reports that she has orders to limit R arm use to waist level only, avoid reaching overhead at this time. She is having difficulty with forward and overhead reaching for items, opening cans w/ R hand, washing hair, and driving. She states that she has learned ways to compensate using her left arm even though she is R hand dominant. Today's assessment was done with all items remaining within patient's tolerance. PROM revealed limitations in flexion, IR, ER, and abd as movement was limited by both pain and muscular guarding. Pt had difficulty fully relaxing the R arm for PROM today and displayed hesitancy to movement beyond waist side. Pt was educated on the importance of taking recovery slow at this point (as advised) and avoid heavy lifting and resistance training after asking to try bicep curls with a small weight at home. Pt is aware that at this stage, it is important to focus of restoring as much ROM as she can and gradually progressing towards more strengthening activities with therapy. Pt scored a 79.5% on the QuickDASH representing severe disability of the UE in day to day activities.  Corianne will benefit from skilled PT intervention to address current deficits to improve  functional ability, mobility, and activity tolerance w/ dec'd pain interference.   OBJECTIVE IMPAIRMENTS: decreased activity tolerance, decreased endurance, decreased knowledge of condition, decreased mobility, decreased ROM, decreased strength, increased fascial restrictions, impaired perceived functional ability, increased muscle spasms, impaired UE functional use, postural dysfunction, and pain.   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   REHAB POTENTIAL: Good  CLINICAL DECISION MAKING: Evolving/moderate complexity  EVALUATION COMPLEXITY: Moderate  GOALS: Goals reviewed with patient? Yes  SHORT TERM GOALS: Target date: 12/11/2023    Patient will be independent with initial HEP.  Baseline: Goal status: MET - 11/16/23  2.  Patient will report at least 25% improvement in R shoulder pain to improve QoL. Baseline: Worst 5-7/10 Goal status: IN PROGRESS - 11/17/23 - pt noting shoulder more irritable over past week  3.  Patient will improve R shoulder flexion PROM to 90 or better to aid in difficulty with forward reaching activities.  Baseline: limited by pain ~60 deg of flexion  Goal status: MET - 11/08/23 - no pain  4.  Patient will decrease her QuickDASH score by at least 15% to decrease severity of disability.  Baseline: 79.5% Goal status:  INITIAL  LONG TERM GOALS:  Target date: 01/22/2024  Patient will be independent with advanced HEP.  Baseline:  Goal status:  INITIAL  2.  Patient will decrease her QuickDASH score to by at least 25-30% to improve function and activity tolerance d/t disability. Baseline: 79.5% Goal status: INITIAL  3.  Patient will be able to perform overhead activities such as washing hair, retrieving items from cabinets to improve independent function at home. Baseline: pt is having difficulty with above activities  Goal status: INITIAL  4.  Patient will report at least 50% improvement in R shoulder pain to improve QoL.  Baseline: 5-7/10 Goal status: INITIAL  5.  Patient will obtain at least 3+/5 MMT of UE for available range to increase activity tolerance.   Baseline: Unable to assess Goal status: INITIAL     PLAN: PT FREQUENCY: 2x/week  PT DURATION: 12 weeks  PLANNED INTERVENTIONS: [id number]- PT Re-evaluation, 97750- Physical Performance Testing, 97110-Therapeutic exercises, 97530- Therapeutic activity, [id number]- Neuromuscular re-education, 97535- Self Care, [id number]- Manual therapy, [phone]- Gait training, [phone]- Electrical stimulation (unattended), [phone]- Ultrasound, 02987- Traction (mechanical), [phone] (1-2 muscles), [id number] (3+ muscles)- Dry Needling, Patient/Family education, Taping, Joint mobilization, Cryotherapy, and Moist heat  PLAN FOR NEXT SESSION: Gentle R shoulder P/AA/AROM within pain free motion; progress isometric strengthening (within pain free levels); review/revise HEP as indicated; may use Reverse Total Shoulder Arthroplasty for Fracture Protocol for guidance (Sx 09/07/23) - post-op week #11 as of 11/23/23   Elijah CHRISTELLA Hidden, PT 11/21/2023, 1:14 PM

## 2023-11-23 ENCOUNTER — Ambulatory Visit: Admitting: Physical Therapy

## 2023-11-23 ENCOUNTER — Ambulatory Visit (HOSPITAL_BASED_OUTPATIENT_CLINIC_OR_DEPARTMENT_OTHER)
Admission: RE | Admit: 2023-11-23 | Discharge: 2023-11-23 | Disposition: A | Discharge status: Home / Self Care | Source: Ambulatory Visit | Attending: Hematology & Oncology | Admitting: Hematology & Oncology

## 2023-11-23 DIAGNOSIS — G709 Myoneural disorder, unspecified: Secondary | ICD-10-CM | POA: Diagnosis not present

## 2023-11-23 DIAGNOSIS — C37 Malignant neoplasm of thymus: Secondary | ICD-10-CM | POA: Diagnosis not present

## 2023-11-23 DIAGNOSIS — G7001 Myasthenia gravis with (acute) exacerbation: Secondary | ICD-10-CM | POA: Diagnosis not present

## 2023-11-23 DIAGNOSIS — M25511 Pain in right shoulder: Secondary | ICD-10-CM | POA: Diagnosis not present

## 2023-11-23 DIAGNOSIS — R252 Cramp and spasm: Secondary | ICD-10-CM

## 2023-11-23 DIAGNOSIS — M25611 Stiffness of right shoulder, not elsewhere classified: Secondary | ICD-10-CM

## 2023-11-23 DIAGNOSIS — I700 Atherosclerosis of aorta: Secondary | ICD-10-CM | POA: Diagnosis not present

## 2023-11-23 DIAGNOSIS — M6281 Muscle weakness (generalized): Secondary | ICD-10-CM

## 2023-11-23 MED ORDER — IOHEXOL 300 MG/ML  SOLN
75.0000 mL | Freq: Once | INTRAMUSCULAR | Status: AC | PRN
  Administered 2023-11-23: 75 mL via INTRAVENOUS

## 2023-11-23 NOTE — Therapy (Signed)
 OUTPATIENT PHYSICAL THERAPY TREATMENT   Patient Name: Faith Massey MRN: [id number] DOB:07-13-1945, 78 y.o., female Today's Date: 11/23/2023  END OF SESSION:  PT End of Session - 11/23/23 1145     Visit Number 8    Date for PT Re-Evaluation 01/22/24    Authorization Type [employer]    Progress Note Due on Visit 10    PT Start Time 1145    PT Stop Time 1231    PT Time Calculation (min) 46 min    Activity Tolerance Patient tolerated treatment well    Behavior During Therapy [facility] for tasks assessed/performed                Past Medical History:  Diagnosis Date   Arthritis    Breast CA (HCC)    Chest mass 03/28/2022   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 10/16/2012   Pneumonia    Pre-diabetes    Prediabetes 05/25/2022   Pyelonephritis 11/10/2022   Right knee injury 02/14/2017   S/P radiation therapy 05/09/2023   S/P Robotic Assisted Right Video Thoracoscopy with resection of Thymus 04/25/2022   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   Past Surgical History:  Procedure Laterality Date   ABDOMINAL HYSTERECTOMY  03/22/2003   BACK SURGERY  03/21/1998   BREAST SURGERY     reconstruction   colon susrgery      CYSTOSCOPY WITH STENT PLACEMENT N/A 02/08/2023   Procedure: POSSIBLE CYSTOSCOPY WITH URETERAL STENT PLACEMENT;  Surgeon: Alvaro Ricardo KATHEE Mickey., MD;  Location: [facility];   Service: Urology;  Laterality: N/A;   LEFT HEART CATH AND CORONARY ANGIOGRAPHY N/A 07/20/2023   Procedure: LEFT HEART CATH AND CORONARY ANGIOGRAPHY;  Surgeon: Verlin Lonni BIRCH, MD;  Location: [facility];  Service: Cardiovascular;  Laterality: N/A;   MASTECTOMY Bilateral 03/21/1993   RESECTION OF A THYMOMA     04-25-22   REVISION TOTAL SHOULDER TO REVERSE TOTAL SHOULDER Right 09/07/2023   Procedure: REVISION, REVERSE TOTAL ARTHROPLASTY, SHOULDER;  Surgeon: Melita Drivers, MD;  Location: [facility];  Service: Orthopedics;  Laterality: Right;  HEMI ARTHROPLASTY, BONEGRAFT GLENOID   right total shoulder     Patient Active Problem List   Diagnosis Date Noted   S/P shoulder hemiarthroplasty, 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   Abnormal EKG 07/21/2023   Chest pain 07/20/2023   Preoperative cardiovascular examination 07/19/2023   Arthritis    Breast CA (HCC)    Hyperlipidemia    Laceration of muscle, fascia and tendon of long head of biceps, unspecified arm, initial encounter    Myasthenia gravis (HCC)    Pneumonia    S/P radiation therapy 05/09/2023   Hoarseness of voice 05/09/2023   Diverticular disease 02/08/2023   History of pulmonary embolism 12/22/2022   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   Pyelonephritis 11/10/2022   Prediabetes 05/25/2022   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  S/P Robotic Assisted Right Video Thoracoscopy with resection of Thymus 04/25/2022   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   Myasthenic syndrome (HCC) 04/12/2022   CVA (cerebral vascular accident) (HCC) 03/28/2022   HTN (hypertension) 03/28/2022   Chest mass 03/28/2022   Right knee injury 02/14/2017   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 10/16/2012   Osteoporosis 10/12/2012    PCP: Watt Harlene BROCKS, MD   REFERRING PROVIDER: Melita Drivers, MD   REFERRING DIAG: [phone] (ICD-10-CM) - Presence of right artificial shoulder joint   THERAPY DIAG:  Acute pain of right shoulder  Stiffness of right shoulder, not  elsewhere classified  Muscle weakness (generalized)  Cramp and spasm  RATIONALE FOR EVALUATION AND TREATMENT: Rehabilitation  ONSET DATE: Post-Op R TSA revision/conversion to hemi-arthroplasty on 09/07/2023   NEXT MD VISIT: 11/29/2023    SUBJECTIVE:                                                                                                                                                                                      SUBJECTIVE STATEMENT: Pt reports she was watering her plants again before coming to PT today.  She feels her shoulder is feeling more stiff lately.  Hand dominance: Right  PERTINENT HISTORY: MG, h/o radiation (March and May 2024) for mediastinal mass, Breast CA, HTN, OP, R TSA (09/07/23)  PAIN:  Are you having pain? Yes: NPRS scale: intermittently up to 5-6/10 Pain location: R anterior glenoid, scapular region  Pain description: mostly aching  Aggravating factors: reach fwd too much Relieving factors: Ice, medication   PRECAUTIONS:  Shoulder: physical therapist referral - Evaluate and treat per protocol status post revision right reverse total shoulder arthroplasty to CTA hemiarthroplasty, DOS 09/07/23 Patient unfortunately had a fracture at the base of the glenosphere making it unstable and requiring removal and conversion to hemiarthroplasty. Please adjust her physical therapy to be done under a very slow and steady and gradual advance of range of motion and gradual use of the right upper extremity within her pain tolerance. Please call me for any additional questions  RED FLAGS: None   WEIGHT BEARING RESTRICTIONS: No  FALLS:  Has patient fallen in last 6 months? Yes. Number of falls a week and a half ago, feel onto buttock while pulling weeds out of yard   LIVING ENVIRONMENT: Lives with: lives alone Lives in: House/apartment Stairs: No Has following equipment at home: Single point cane, shower chair, Grab bars, and walking stick    OCCUPATION: Retired   PLOF: Independent with household mobility with device and Leisure: watching tv, reading, walking her dog, social gatherings w/ friends to play card games   PATIENT GOALS:  Get my shoulder to functioning, lifting shoulder, bearing weight   OBJECTIVE:  Note: Objective measures were completed at Evaluation unless otherwise noted.  DIAGNOSTIC FINDINGS:  09/20/2022: R shoulder XR  Results: Degenerative changes of the before meals and glenohumeral joints. No other acute findings.   PATIENT SURVEYS :  QuickDASH Score: 79.5 / 100 = 79.5 %  COGNITION: Overall cognitive status: Within functional limits for tasks assessed     SENSATION: WFL  POSTURE: Rounded shoulders, fwd head   UPPER EXTREMITY ROM:  LUE: Grossly WFL   Passive ROM Right eval R 11/02/23 R 11/08/23  Shoulder flexion Limited, p! Less than 90 (around 60) 84 90  Shoulder extension     Shoulder abduction Limited less than 90 74 scaption 80 scaption  Shoulder adduction     Shoulder internal rotation Limited p! 35 at 30 scaption   Shoulder external rotation Limitied p! 45 at 30 scaption 50   (Blank rows = not tested)  Active ROM Right  Shoulder flexion   Shoulder extension   Shoulder abduction   Shoulder adduction   Shoulder extension   Shoulder internal rotation   Shoulder external rotation    (Blank rows = not tested)   UPPER EXTREMITY MMT:  MMT Right eval Left eval  Shoulder flexion  4-  Shoulder extension    Shoulder abduction  4+  Shoulder adduction    Shoulder internal rotation  4-  Shoulder external rotation  4-  Middle trapezius    Lower trapezius    Elbow flexion  5  Elbow extension  4+  Wrist flexion    Wrist extension    Wrist ulnar deviation    Wrist radial deviation    Wrist pronation    Wrist supination    Grip strength (lbs)    (Blank rows = not tested)  SHOULDER SPECIAL TESTS: Not Performed   JOINT MOBILITY TESTING:  Not Performed   PALPATION:  TTP:  R ant glenoid, collar bone area (tender, not painful)                                                                                                                              TREATMENT DATE:   11/23/2023 THERAPEUTIC EXERCISE: To improve strength, endurance, ROM, and flexibility.  Demonstration, verbal and tactile cues throughout for technique.  Pulleys: Flexion & Scaption x 3 min each  Standing R shoulder flexion/scaption wall slides x 10 - AAROM required with L hand at elbow Standing R shoulder flexion counter slides with 2# weight on washcloth 2 x 10 Standing R shoulder scaption counter slides with 2# weight on washcloth 2 x 10 Seated B shoulder orange Pball flexion & scaption walk-out x 10 each (attempted initially on wall but deferred d/t excessive R shoulder shrugging  NEUROMUSCULAR RE-EDUCATION: To improve coordination, kinesthesia, and posture. R shoulder flexion YTB reactive isometric step-outs x 5 (neutral shoulder with elbow flexed to 90) R shoulder extension YTB reactive isometric step-outs  x 5 (neutral shoulder with elbow flexed to 90) - cues for scapular retraction activation to stabilize shoulder R shoulder IR YTB reactive isometric step-outs x 5 (neutral shoulder with elbow flexed to 90) R shoulder ER YTB reactive isometric step-outs x 5 (neutral shoulder with elbow flexed to 90) - cues for scapular retraction activation to stabilize shoulder   11/21/2023  THERAPEUTIC EXERCISE: To improve strength, endurance, ROM, and flexibility.  Demonstration, verbal and tactile cues throughout for technique. Pulleys: Flexion & Scaption x 3 min each  Supine for gentle R shoulder PROM/stretching all planes Supine flexed elbow R shoulder flexion/scaption (uppercut) AA/AROM 2 x 10 - somewhat better tolerance for AROM but still slight guiding from PT initially with motion  MANUAL THERAPY: To promote normalized muscle tension, improved flexibility, improved joint mobility, and reduced  pain utilizing connective tissue massage, therapeutic massage, manual TP therapy, and scar mobilization.  STM/XFM to R shoulder surgical incision Supine gentle R shoulder oscillation and grade I-II inferior glide for pain relief/muscle relxation  NEUROMUSCULAR RE-EDUCATION: To improve coordination, kinesthesia, and posture. S/L R shoulder ER maintaining mini-pillow under axilla and R elbow at 90 degrees 2 x 10 - VC & TC for scapular engagement, avoiding shoulder shrug S/L R shoulder flexion - initially attempted with elbow straight but transitioned to R elbow at 90 degrees x 10  SELF CARE:  Provided education on sleeping position and use of pillow(s) to support R arm.    11/17/2023  THERAPEUTIC EXERCISE: To improve ROM and flexibility.  Demonstration, verbal and tactile cues throughout for technique.  Pulleys: Flexion & Scaption x 3 min each  Supine for gentle R shoulder PROM/stretching all planes Supine flexed elbow R shoulder flexion/scaption (uppercut) AA/AROM 2 x 10 - AAROM d/t increased discomfort with AROM  MANUAL THERAPY: To promote normalized muscle tension, improved flexibility, improved joint mobility, and reduced pain utilizing connective tissue massage, therapeutic massage, manual TP therapy, and scar mobilization.  STM/XFM to R shoulder surgical incision STM/DTM and manual TPR to R pecs  NEUROMUSCULAR RE-EDUCATION: To improve coordination, kinesthesia, and posture. S/L R shoulder ER maintaining towel roll under axilla and R elbow at 90 degrees 2 x 10 - VC & TC for scapular engagement S/L R shoulder flexion - initially attempted with elbow straight but transitioned to R elbow at 90 degrees x 10 Seated YTB scap retraction + B shoulder row x 10 Seated YTB scap retraction + B shoulder extension stopping shy of neutral x 10   11/14/23: Pulleys 3 min flexion B Supine for gentle stretching R shoulder all planes Supine with R humerus supported in line with trunk for manually  resisted R shoulder isometric inv/ev mass practice Side lying L for R shoulder ER maintaining towel roll under axilla and R elbow at 90 degrees Standing with R elbow supported on counter, pulling towel with 6# on towel from flexed position to neutral, then advance to sitting in chair for yellow t band rows, needed frequent cues to avoid R shoulder extension past trunk, had pt slide back in chair so that the seat back blocked her from extending R shoulder    11/08/2023  THERAPEUTIC EXERCISE: To improve ROM and flexibility.  Demonstration, verbal and tactile cues throughout for technique.  Pulleys: Flexion x 3 min, Scaption x 3 min (scaption better tolerated today) Standing counter wash AAROM LUE: Flexion x 20 Scaption x 20 R shoulder PROM within pain tolerance for flexion, scaption, IR and ER Measured PROM Shoulder isometrics: flex, abd, ER, ext 5x5  Standing YTB scap retraction + B shoulder row- clicking clicking at first but after readjusting no clicking  Standing YTB scap retraction + B shoulder extension stopping shy of neutral x 10   11/06/2023  THERAPEUTIC EXERCISE: To improve ROM and flexibility.  Demonstration, verbal and tactile cues throughout for technique.  Pulleys: Flexion x 3 min, Scaption x 3 min (scaption better tolerated today) Seated R shoulder AAROM with Swiffer: Flexion 2 x 10 Scaption 2 x 10 R shoulder PROM by PT within pain tolerance for flexion, scaption, IR and ER  Hooklying R shoulder AAROM with cane: Flexion 2 x 10 Scaption x 10 - pt needing PT guidance/assistance for proper movement pattern  NEUROMUSCULAR RE-EDUCATION: To improve coordination, kinesthesia, posture, and proprioception. R shoulder rhythmic stabilization at 90 flexion (1-finger perturbations) 2 x 15-20 sec R shoulder protraction AAROM with wand 2 x 10 Standing YTB scap retraction + B shoulder row x 10 Standing YTB scap retraction + B shoulder extension stopping shy of neutral x  10   11/02/2023  THERAPEUTIC EXERCISE: To improve strength, endurance, ROM, and flexibility.  Demonstration, verbal and tactile cues throughout for technique.  Pulleys: Flexion x 3 min, Scaption x 1.5 min (discontinued d/t increasing discomfort with scaption) Table slides standing at counter top:  Flexion 2 x 10 Scaption 2 x 10 Seated R shoulder AAROM with Swiffer: Flexion 2 x 10 Scaption 2 x 10 R shoulder PROM by PT within pain tolerance for flexion, scaption, IR and ER (ER limited to ~45 per protocol) Standing R shoulder submax (~25% effort) isometrics into wall 5 x 5 - flexion, abduction, extension, IR & ER Standing scapular retraction 10 x 5  SELF CARE:  Yellow Theraputty provided for home grip strengthening   10/30/2023 SELF CARE:  Reviewed eval findings and role of PT in addressing identified deficits as well as instruction in initial HEP (see below).    PATIENT EDUCATION:  Education details: HEP progression - YTB reactive isometrics  Person educated: Patient Education method: Explanation, Demonstration, Verbal cues, Tactile cues, and [email or web address] app updated Education comprehension: verbalized understanding, returned demonstration, verbal cues required, tactile cues required, and needs further education  HOME EXERCISE PROGRAM: Access Code: [email or web address] URL: [email or web address] Date: 11/17/2023 Prepared by: Faith Massey  Exercises - Seated Shoulder Flexion Towel Slide at Table Top  - 1 x daily - 7 x weekly - 3 sets - 10 reps - Standing Single Arm Shoulder Flexion Towel Slide at Table Top  - 1 x daily - 7 x weekly - 3 sets - 10 reps - Circular Shoulder Pendulum with Table Support  - 1 x daily - 7 x weekly - 3 sets - 10 reps - Isometric Shoulder Flexion at Wall  - 1 x daily - 3 x weekly - 2 sets - 5 reps - 5 sec hold - Isometric Shoulder Abduction at Wall  - 1 x daily - 3 x weekly - 2 sets - 5 reps - 5 sec hold - Isometric Shoulder Extension at Wall  - 1 x  daily - 3 x weekly - 2 sets - 5 reps - 5 sec hold - Standing Isometric Shoulder Internal Rotation at Doorway  - 1 x daily - 3 x weekly - 2 sets - 5 reps - 5 sec hold - Standing Isometric Shoulder External Rotation with Doorway  - 1 x daily - 3 x weekly - 2 sets - 5 reps - 5 sec hold - Standing Scapular Retraction  - 1 x daily - 7 x  weekly - 2 sets - 10 reps - 3-5 sec hold - Sidelying Shoulder External Rotation  - 1 x daily - 3 x weekly - 3 sets - 10 reps - Sidelying Shoulder Flexion 15 Degrees  - 1 x daily - 3 x weekly - 2 sets - 10 reps - 3 sec hold - Seated Shoulder Extension and Scapular Retraction with Resistance  - 1 x daily - 3 x weekly - 2 sets - 10 reps - 3-5 sec hold - Seated Shoulder Row with Anchored Resistance  - 1 x daily - 3 x weekly - 2 sets - 10 reps - 3-5 hold hold - Shoulder Scar Massage  - 1-2 x daily - 7 x weekly - 1-2 min hold   ASSESSMENT:  CLINICAL IMPRESSION: Faith Massey reports continued increased pain recently, but is still potentially overutilizing R UE with daily activities such as watering and moving her plants.  She continues to lack AA/AROM and R shoulder overhead planes with gravity resistance, demonstrating significant tendency for increased shoulder shrug/hike, but better tolerance noted in sitting with forward flexion rollouts  Reinforced continued use of ice as needed to manage pain/soreness.  R shoulder motion remains limited by pain and tightness as well as incisional scr adhesions.  Faith Massey will benefit from continued skilled PT to address ongoing ROM and strength deficits to improve functional UE use and activity tolerance with decreased pain interference.   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 Pt reports that she has orders to limit R arm use to waist level only, avoid reaching overhead at this time. She is having  difficulty with forward and overhead reaching for items, opening cans w/ R hand, washing hair, and driving. She states that she has learned ways to compensate using her left arm even though she is R hand dominant. Today's assessment was done with all items remaining within patient's tolerance. PROM revealed limitations in flexion, IR, ER, and abd as movement was limited by both pain and muscular guarding. Pt had difficulty fully relaxing the R arm for PROM today and displayed hesitancy to movement beyond waist side. Pt was educated on the importance of taking recovery slow at this point (as advised) and avoid heavy lifting and resistance training after asking to try bicep curls with a small weight at home. Pt is aware that at this stage, it is important to focus of restoring as much ROM as she can and gradually progressing towards more strengthening activities with therapy. Pt scored a 79.5% on the QuickDASH representing severe disability of the UE in day to day activities.  Faith Massey will benefit from skilled PT intervention to address current deficits to improve functional ability, mobility, and activity tolerance w/ dec'd pain interference.   OBJECTIVE IMPAIRMENTS: decreased activity tolerance, decreased endurance, decreased knowledge of condition, decreased mobility, decreased ROM, decreased strength, increased fascial restrictions, impaired perceived functional ability, increased muscle spasms, impaired UE functional use, postural dysfunction, and pain.   ACTIVITY LIMITATIONS: carrying, lifting, bending, sleeping, transfers, bed mobility, bathing, toileting, dressing, self feeding, reach over head, hygiene/grooming, and locomotion level  PARTICIPATION LIMITATIONS: meal prep, cleaning, laundry, driving, shopping, community activity, and yard work  PERSONAL FACTORS: Age, Past/current experiences, Time since onset of injury/illness/exacerbation, and 3+ comorbidities: MG, h/o radiation (March and May 2024) for  mediastinal mass, Breast CA, HTN, OP, R TSA (09/07/23) are  also affecting patient's functional outcome.   REHAB POTENTIAL: Good  CLINICAL DECISION MAKING: Evolving/moderate complexity  EVALUATION COMPLEXITY: Moderate  GOALS: Goals reviewed with patient? Yes  SHORT TERM GOALS: Target date: 12/11/2023    Patient will be independent with initial HEP.  Baseline: Goal status: MET - 11/16/23  2.  Patient will report at least 25% improvement in R shoulder pain to improve QoL. Baseline: Worst 5-7/10 Goal status: IN PROGRESS - 11/17/23 - pt noting shoulder more irritable over past week  3.  Patient will improve R shoulder flexion PROM to 90 or better to aid in difficulty with forward reaching activities.  Baseline: limited by pain ~60 deg of flexion  Goal status: MET - 11/08/23 - no pain  4.  Patient will decrease her QuickDASH score by at least 15% to decrease severity of disability.  Baseline: 79.5% Goal status:  INITIAL  LONG TERM GOALS: Target date: 01/22/2024  Patient will be independent with advanced HEP.  Baseline:  Goal status: INITIAL  2.  Patient will decrease her QuickDASH score to by at least 25-30% to improve function and activity tolerance d/t disability. Baseline: 79.5% Goal status: INITIAL  3.  Patient will be able to perform overhead activities such as washing hair, retrieving items from cabinets to improve independent function at home. Baseline: pt is having difficulty with above activities  Goal status: INITIAL  4.  Patient will report at least 50% improvement in R shoulder pain to improve QoL.  Baseline: 5-7/10 Goal status: INITIAL  5.  Patient will obtain at least 3+/5 MMT of UE for available range to increase activity tolerance.   Baseline: Unable to assess Goal status: INITIAL     PLAN: PT FREQUENCY: 2x/week  PT DURATION: 12 weeks  PLANNED INTERVENTIONS: 97164- PT Re-evaluation, 97750- Physical Performance Testing, 97110-Therapeutic exercises, 97530-  Therapeutic activity, [id number]- Neuromuscular re-education, 97535- Self Care, 02859- Manual therapy, [phone]- Gait training, [phone]- Electrical stimulation (unattended), [phone]- Ultrasound, 02987- Traction (mechanical), [phone] (1-2 muscles), [id number] (3+ muscles)- Dry Needling, Patient/Family education, Taping, Joint mobilization, Cryotherapy, and Moist heat  PLAN FOR NEXT SESSION: Gentle R shoulder P/AA/AROM within pain free motion; progress isometric strengthening (within pain free levels); review/revise HEP as indicated; may use Reverse Total Shoulder Arthroplasty for Fracture Protocol for guidance (Sx 09/07/23) - post-op week #11 as of 11/23/23   Faith Massey, PT 11/23/2023, 11:24 PM

## 2023-11-24 DIAGNOSIS — G709 Myoneural disorder, unspecified: Secondary | ICD-10-CM | POA: Diagnosis not present

## 2023-11-24 DIAGNOSIS — G7001 Myasthenia gravis with (acute) exacerbation: Secondary | ICD-10-CM | POA: Diagnosis not present

## 2023-11-27 ENCOUNTER — Ambulatory Visit: Payer: Self-pay | Admitting: Hematology & Oncology

## 2023-11-27 ENCOUNTER — Ambulatory Visit

## 2023-11-27 DIAGNOSIS — M25611 Stiffness of right shoulder, not elsewhere classified: Secondary | ICD-10-CM

## 2023-11-27 DIAGNOSIS — M6281 Muscle weakness (generalized): Secondary | ICD-10-CM

## 2023-11-27 DIAGNOSIS — R252 Cramp and spasm: Secondary | ICD-10-CM

## 2023-11-27 DIAGNOSIS — M25511 Pain in right shoulder: Secondary | ICD-10-CM | POA: Diagnosis not present

## 2023-11-27 NOTE — Therapy (Signed)
 OUTPATIENT PHYSICAL THERAPY TREATMENT   Patient Name: Faith Massey MRN: [id number] DOB:1945/08/04, 78 y.o., female Today's Date: 11/27/2023  END OF SESSION:  PT End of Session - 11/27/23 1249     Visit Number 9    Date for PT Re-Evaluation 01/22/24    Authorization Type [employer]    Progress Note Due on Visit 10    PT Start Time 1145    PT Stop Time 1228    PT Time Calculation (min) 43 min    Activity Tolerance Patient tolerated treatment well    Behavior During Therapy [facility] for tasks assessed/performed                 Past Medical History:  Diagnosis Date   Arthritis    Breast CA (HCC)    Chest mass 03/28/2022   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 10/16/2012   Pneumonia    Pre-diabetes    Prediabetes 05/25/2022   Pyelonephritis 11/10/2022   Right knee injury 02/14/2017   S/P radiation therapy 05/09/2023   S/P Robotic Assisted Right Video Thoracoscopy with resection of Thymus 04/25/2022   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   Past Surgical History:  Procedure Laterality Date   ABDOMINAL HYSTERECTOMY  03/22/2003   BACK SURGERY  03/21/1998   BREAST SURGERY     reconstruction   colon susrgery      CYSTOSCOPY WITH STENT PLACEMENT N/A 02/08/2023   Procedure: POSSIBLE CYSTOSCOPY WITH URETERAL STENT PLACEMENT;  Surgeon: Alvaro Ricardo KATHEE Mickey., MD;  Location: [facility];   Service: Urology;  Laterality: N/A;   LEFT HEART CATH AND CORONARY ANGIOGRAPHY N/A 07/20/2023   Procedure: LEFT HEART CATH AND CORONARY ANGIOGRAPHY;  Surgeon: Verlin Lonni BIRCH, MD;  Location: [facility];  Service: Cardiovascular;  Laterality: N/A;   MASTECTOMY Bilateral 03/21/1993   RESECTION OF A THYMOMA     04-25-22   REVISION TOTAL SHOULDER TO REVERSE TOTAL SHOULDER Right 09/07/2023   Procedure: REVISION, REVERSE TOTAL ARTHROPLASTY, SHOULDER;  Surgeon: Melita Drivers, MD;  Location: [facility];  Service: Orthopedics;  Laterality: Right;  HEMI ARTHROPLASTY, BONEGRAFT GLENOID   right total shoulder     Patient Active Problem List   Diagnosis Date Noted   S/P shoulder hemiarthroplasty, 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   Abnormal EKG 07/21/2023   Chest pain 07/20/2023   Preoperative cardiovascular examination 07/19/2023   Arthritis    Breast CA (HCC)    Hyperlipidemia    Laceration of muscle, fascia and tendon of long head of biceps, unspecified arm, initial encounter    Myasthenia gravis (HCC)    Pneumonia    S/P radiation therapy 05/09/2023   Hoarseness of voice 05/09/2023   Diverticular disease 02/08/2023   History of pulmonary embolism 12/22/2022   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   Pyelonephritis 11/10/2022   Prediabetes 05/25/2022   Type B2 thymoma (HCC)  05/12/2022   S/P Robotic Assisted Right Video Thoracoscopy with resection of Thymus 04/25/2022   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   Myasthenic syndrome (HCC) 04/12/2022   CVA (cerebral vascular accident) (HCC) 03/28/2022   HTN (hypertension) 03/28/2022   Chest mass 03/28/2022   Right knee injury 02/14/2017   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 10/16/2012   Osteoporosis 10/12/2012    PCP: Watt Harlene BROCKS, MD   REFERRING PROVIDER: Melita Drivers, MD   REFERRING DIAG: [phone] (ICD-10-CM) - Presence of right artificial shoulder joint   THERAPY DIAG:  Acute pain of right shoulder  Stiffness of right shoulder, not  elsewhere classified  Muscle weakness (generalized)  Cramp and spasm  RATIONALE FOR EVALUATION AND TREATMENT: Rehabilitation  ONSET DATE: Post-Op R TSA revision/conversion to hemi-arthroplasty on 09/07/2023   NEXT MD VISIT: 11/29/2023    SUBJECTIVE:                                                                                                                                                                                      SUBJECTIVE STATEMENT: Shoulder feeling ok  Hand dominance: Right  PERTINENT HISTORY: MG, h/o radiation (March and May 2024) for mediastinal mass, Breast CA, HTN, OP, R TSA (09/07/23)  PAIN:  Are you having pain? Yes: NPRS scale: intermittently up to 5-6/10 Pain location: R anterior glenoid, scapular region  Pain description: mostly aching  Aggravating factors: reach fwd too much Relieving factors: Ice, medication   PRECAUTIONS:  Shoulder: physical therapist referral - Evaluate and treat per protocol status post revision right reverse total shoulder arthroplasty to CTA hemiarthroplasty, DOS 09/07/23 Patient unfortunately had a fracture at the base of the glenosphere making it unstable and requiring removal and conversion to hemiarthroplasty. Please adjust her physical therapy to be done under a very slow and steady and gradual advance of range of motion and gradual use of the right upper extremity within her pain tolerance. Please call me for any additional questions  RED FLAGS: None   WEIGHT BEARING RESTRICTIONS: No  FALLS:  Has patient fallen in last 6 months? Yes. Number of falls a week and a half ago, feel onto buttock while pulling weeds out of yard   LIVING ENVIRONMENT: Lives with: lives alone Lives in: House/apartment Stairs: No Has following equipment at home: Single point cane, shower chair, Grab bars, and walking stick   OCCUPATION: Retired   PLOF: Independent with household mobility with device and Leisure: watching tv, reading, walking  her dog, social gatherings w/ friends to play card games   PATIENT GOALS: Get my shoulder to functioning, lifting shoulder, bearing weight   OBJECTIVE:  Note: Objective measures were  completed at Evaluation unless otherwise noted.  DIAGNOSTIC FINDINGS:  09/20/2022: R shoulder XR  Results: Degenerative changes of the before meals and glenohumeral joints. No other acute findings.   PATIENT SURVEYS :  QuickDASH Score: 79.5 / 100 = 79.5 %  COGNITION: Overall cognitive status: Within functional limits for tasks assessed     SENSATION: WFL  POSTURE: Rounded shoulders, fwd head   UPPER EXTREMITY ROM:  LUE: Grossly WFL   Passive ROM Right eval R 11/02/23 R 11/08/23 11/27/23 R  Shoulder flexion Limited, p! Less than 90 (around 60) 84 90 120  Shoulder extension      Shoulder abduction Limited less than 90 74 scaption 80 scaption 100  Shoulder adduction      Shoulder internal rotation Limited p! 35 at 30 scaption    Shoulder external rotation Limitied p! 45 at 30 scaption 50  64  (Blank rows = not tested)  Active ROM Right  Shoulder flexion   Shoulder extension   Shoulder abduction   Shoulder adduction   Shoulder extension   Shoulder internal rotation   Shoulder external rotation    (Blank rows = not tested)   UPPER EXTREMITY MMT:  MMT Right eval Left eval  Shoulder flexion  4-  Shoulder extension    Shoulder abduction  4+  Shoulder adduction    Shoulder internal rotation  4-  Shoulder external rotation  4-  Middle trapezius    Lower trapezius    Elbow flexion  5  Elbow extension  4+  Wrist flexion    Wrist extension    Wrist ulnar deviation    Wrist radial deviation    Wrist pronation    Wrist supination    Grip strength (lbs)    (Blank rows = not tested)  SHOULDER SPECIAL TESTS: Not Performed   JOINT MOBILITY TESTING:  Not Performed   PALPATION:  TTP: R ant glenoid, collar bone area (tender, not painful)                                                                                                                               TREATMENT DATE:  11/27/2023 THERAPEUTIC EXERCISE: To improve strength, endurance, ROM, and flexibility.  Demonstration, verbal and tactile cues throughout for technique.  Pulleys: Flexion & Scaption x 3 min each  Standing R shoulder flexion/scaption wall slides x 5 - AAROM required with L hand at elbow- limited today Standing R shoulder flexion counter slides with 2# weight on washcloth 2 x 10 Standing R shoulder scaption counter slides with 2# weight on washcloth 2 x 10 Seated hand clasped AAROM shoulder flexion x 5   NEUROMUSCULAR RE-EDUCATION: To improve coordination, kinesthesia, and posture. R shoulder flexion YTB reactive isometric step-outs x 10 (neutral shoulder with elbow flexed to 90) R shoulder extension YTB reactive isometric step-outs x 10 (neutral shoulder with elbow flexed to 90) - cues for scapular retraction activation to stabilize shoulder R shoulder IR YTB  reactive isometric step-outs x 10 (neutral shoulder with elbow flexed to 90) R shoulder ER YTB reactive isometric step-outs x 10 (neutral shoulder with elbow flexed to 90) - cues for scapular retraction activation to stabilize shoulder 11/23/2023 THERAPEUTIC EXERCISE: To improve strength, endurance, ROM, and flexibility.  Demonstration, verbal and tactile cues throughout for technique.  Pulleys: Flexion & Scaption x 3 min each  Standing R shoulder flexion/scaption wall slides x 10 - AAROM required with L hand at elbow Standing R shoulder flexion counter slides with 2# weight on washcloth 2 x 10 Standing R shoulder scaption counter slides with 2# weight on washcloth 2 x 10 Seated B shoulder orange Pball flexion & scaption walk-out x 10 each (attempted initially on wall but deferred d/t excessive R shoulder shrugging  NEUROMUSCULAR RE-EDUCATION: To improve coordination, kinesthesia, and posture. R shoulder flexion YTB reactive isometric step-outs  x 5 (neutral shoulder with elbow flexed to 90) R shoulder extension YTB reactive isometric step-outs x 5 (neutral shoulder with elbow flexed to 90) - cues for scapular retraction activation to stabilize shoulder R shoulder IR YTB reactive isometric step-outs x 5 (neutral shoulder with elbow flexed to 90) R shoulder ER YTB reactive isometric step-outs x 5 (neutral shoulder with elbow flexed to 90) - cues for scapular retraction activation to stabilize shoulder   11/21/2023  THERAPEUTIC EXERCISE: To improve strength, endurance, ROM, and flexibility.  Demonstration, verbal and tactile cues throughout for technique. Pulleys: Flexion & Scaption x 3 min each  Supine for gentle R shoulder PROM/stretching all planes Supine flexed elbow R shoulder flexion/scaption (uppercut) AA/AROM 2 x 10 - somewhat better tolerance for AROM but still slight guiding from PT initially with motion  MANUAL THERAPY: To promote normalized muscle tension, improved flexibility, improved joint mobility, and reduced pain utilizing connective tissue massage, therapeutic massage, manual TP therapy, and scar mobilization.  STM/XFM to R shoulder surgical incision Supine gentle R shoulder oscillation and grade I-II inferior glide for pain relief/muscle relxation  NEUROMUSCULAR RE-EDUCATION: To improve coordination, kinesthesia, and posture. S/L R shoulder ER maintaining mini-pillow under axilla and R elbow at 90 degrees 2 x 10 - VC & TC for scapular engagement, avoiding shoulder shrug S/L R shoulder flexion - initially attempted with elbow straight but transitioned to R elbow at 90 degrees x 10  SELF CARE:  Provided education on sleeping position and use of pillow(s) to support R arm.    11/17/2023  THERAPEUTIC EXERCISE: To improve ROM and flexibility.  Demonstration, verbal and tactile cues throughout for technique.  Pulleys: Flexion & Scaption x 3 min each  Supine for gentle R shoulder PROM/stretching all planes Supine  flexed elbow R shoulder flexion/scaption (uppercut) AA/AROM 2 x 10 - AAROM d/t increased discomfort with AROM  MANUAL THERAPY: To promote normalized muscle tension, improved flexibility, improved joint mobility, and reduced pain utilizing connective tissue massage, therapeutic massage, manual TP therapy, and scar mobilization.  STM/XFM to R shoulder surgical incision STM/DTM and manual TPR to R pecs  NEUROMUSCULAR RE-EDUCATION: To improve coordination, kinesthesia, and posture. S/L R shoulder ER maintaining towel roll under axilla and R elbow at 90 degrees 2 x 10 - VC & TC for scapular engagement S/L R shoulder flexion - initially attempted with elbow straight but transitioned to R elbow at 90 degrees x 10 Seated YTB scap retraction + B shoulder row x 10 Seated YTB scap retraction + B shoulder extension stopping shy of neutral x 10   11/14/23: Pulleys 3 min flexion B Supine for  gentle stretching R shoulder all planes Supine with R humerus supported in line with trunk for manually resisted R shoulder isometric inv/ev mass practice Side lying L for R shoulder ER maintaining towel roll under axilla and R elbow at 90 degrees Standing with R elbow supported on counter, pulling towel with 6# on towel from flexed position to neutral, then advance to sitting in chair for yellow t band rows, needed frequent cues to avoid R shoulder extension past trunk, had pt slide back in chair so that the seat back blocked her from extending R shoulder    11/08/2023  THERAPEUTIC EXERCISE: To improve ROM and flexibility.  Demonstration, verbal and tactile cues throughout for technique.  Pulleys: Flexion x 3 min, Scaption x 3 min (scaption better tolerated today) Standing counter wash AAROM LUE: Flexion x 20 Scaption x 20 R shoulder PROM within pain tolerance for flexion, scaption, IR and ER Measured PROM Shoulder isometrics: flex, abd, ER, ext 5x5 Standing YTB scap retraction + B shoulder row- clicking  clicking at first but after readjusting no clicking  Standing YTB scap retraction + B shoulder extension stopping shy of neutral x 10   11/06/2023  THERAPEUTIC EXERCISE: To improve ROM and flexibility.  Demonstration, verbal and tactile cues throughout for technique.  Pulleys: Flexion x 3 min, Scaption x 3 min (scaption better tolerated today) Seated R shoulder AAROM with Swiffer: Flexion 2 x 10 Scaption 2 x 10 R shoulder PROM by PT within pain tolerance for flexion, scaption, IR and ER  Hooklying R shoulder AAROM with cane: Flexion 2 x 10 Scaption x 10 - pt needing PT guidance/assistance for proper movement pattern  NEUROMUSCULAR RE-EDUCATION: To improve coordination, kinesthesia, posture, and proprioception. R shoulder rhythmic stabilization at 90 flexion (1-finger perturbations) 2 x 15-20 sec R shoulder protraction AAROM with wand 2 x 10 Standing YTB scap retraction + B shoulder row x 10 Standing YTB scap retraction + B shoulder extension stopping shy of neutral x 10   11/02/2023  THERAPEUTIC EXERCISE: To improve strength, endurance, ROM, and flexibility.  Demonstration, verbal and tactile cues throughout for technique.  Pulleys: Flexion x 3 min, Scaption x 1.5 min (discontinued d/t increasing discomfort with scaption) Table slides standing at counter top:  Flexion 2 x 10 Scaption 2 x 10 Seated R shoulder AAROM with Swiffer: Flexion 2 x 10 Scaption 2 x 10 R shoulder PROM by PT within pain tolerance for flexion, scaption, IR and ER (ER limited to ~45 per protocol) Standing R shoulder submax (~25% effort) isometrics into wall 5 x 5 - flexion, abduction, extension, IR & ER Standing scapular retraction 10 x 5  SELF CARE:  Yellow Theraputty provided for home grip strengthening   10/30/2023 SELF CARE:  Reviewed eval findings and role of PT in addressing identified deficits as well as instruction in initial HEP (see below).    PATIENT EDUCATION:  Education details: HEP  progression - YTB reactive isometrics  Person educated: Patient Education method: Explanation, Demonstration, Verbal cues, Tactile cues, and MedBridgeGO app updated Education comprehension: verbalized understanding, returned demonstration, verbal cues required, tactile cues required, and needs further education  HOME EXERCISE PROGRAM: Access Code: [email or web address] URL: [email or web address] Date: 11/17/2023 Prepared by: Elijah Hidden  Exercises - Seated Shoulder Flexion Towel Slide at Table Top  - 1 x daily - 7 x weekly - 3 sets - 10 reps - Standing Single Arm Shoulder Flexion Towel Slide at Table Top  - 1 x daily - 7 x weekly - 3 sets -  10 reps - Circular Shoulder Pendulum with Table Support  - 1 x daily - 7 x weekly - 3 sets - 10 reps - Isometric Shoulder Flexion at Wall  - 1 x daily - 3 x weekly - 2 sets - 5 reps - 5 sec hold - Isometric Shoulder Abduction at Wall  - 1 x daily - 3 x weekly - 2 sets - 5 reps - 5 sec hold - Isometric Shoulder Extension at Wall  - 1 x daily - 3 x weekly - 2 sets - 5 reps - 5 sec hold - Standing Isometric Shoulder Internal Rotation at Doorway  - 1 x daily - 3 x weekly - 2 sets - 5 reps - 5 sec hold - Standing Isometric Shoulder External Rotation with Doorway  - 1 x daily - 3 x weekly - 2 sets - 5 reps - 5 sec hold - Standing Scapular Retraction  - 1 x daily - 7 x weekly - 2 sets - 10 reps - 3-5 sec hold - Sidelying Shoulder External Rotation  - 1 x daily - 3 x weekly - 3 sets - 10 reps - Sidelying Shoulder Flexion 15 Degrees  - 1 x daily - 3 x weekly - 2 sets - 10 reps - 3 sec hold - Seated Shoulder Extension and Scapular Retraction with Resistance  - 1 x daily - 3 x weekly - 2 sets - 10 reps - 3-5 sec hold - Seated Shoulder Row with Anchored Resistance  - 1 x daily - 3 x weekly - 2 sets - 10 reps - 3-5 hold hold - Shoulder Scar Massage  - 1-2 x daily - 7 x weekly - 1-2 min hold   ASSESSMENT:  CLINICAL IMPRESSION: Faith Massey reports continued increased  pain recently, but is still potentially overutilizing R UE with daily activities such as watering and moving her plants.  She continues to lack AA/AROM in R shoulder overhead planes with gravity resistance, demonstrating significant tendency for increased shoulder shrug/hike, but better tolerance noted in sitting with forward flexion rollouts.  Progressed to R shoulder isometrics to reactive isometric step outs, cautioning patient to avoid stepping beyond limit of control and/or increase in pain.  Reinforced continued use of ice as needed to manage pain/soreness.  Faith Massey will benefit from continued skilled PT to address ongoing ROM and strength deficits to improve functional UE use and activity tolerance with decreased pain interference.   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 Pt reports that she has orders to limit R arm use to waist level only, avoid reaching overhead at this time. She is having difficulty with forward and overhead reaching for items, opening cans w/ R hand, washing hair, and driving. She states that she has learned ways to compensate using her left arm even though she is R hand dominant. Today's assessment was done with all items remaining within patient's tolerance. PROM revealed limitations in flexion, IR, ER, and abd as movement was limited by both pain and muscular guarding. Pt had difficulty fully relaxing the R arm for PROM today and displayed hesitancy to movement beyond waist side. Pt was educated on the importance of taking recovery slow at this point (as advised) and avoid heavy lifting and resistance training after asking to try bicep curls with a small weight at home. Pt is aware that at  this stage, it is important to focus of restoring as much ROM as she can and gradually progressing towards more strengthening activities with  therapy. Pt scored a 79.5% on the QuickDASH representing severe disability of the UE in day to day activities.  Faith Massey will benefit from skilled PT intervention to address current deficits to improve functional ability, mobility, and activity tolerance w/ dec'd pain interference.   OBJECTIVE IMPAIRMENTS: decreased activity tolerance, decreased endurance, decreased knowledge of condition, decreased mobility, decreased ROM, decreased strength, increased fascial restrictions, impaired perceived functional ability, increased muscle spasms, impaired UE functional use, postural dysfunction, and pain.   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   REHAB POTENTIAL: Good  CLINICAL DECISION MAKING: Evolving/moderate complexity  EVALUATION COMPLEXITY: Moderate  GOALS: Goals reviewed with patient? Yes  SHORT TERM GOALS: Target date: 12/11/2023    Patient will be independent with initial HEP.  Baseline: Goal status: MET - 11/16/23  2.  Patient will report at least 25% improvement in R shoulder pain to improve QoL. Baseline: Worst 5-7/10 Goal status: IN PROGRESS - 11/17/23 - pt noting shoulder more irritable over past week  3.  Patient will improve R shoulder flexion PROM to 90 or better to aid in difficulty with forward reaching activities.  Baseline: limited by pain ~60 deg of flexion  Goal status: MET - 11/08/23 - no pain  4.  Patient will decrease her QuickDASH score by at least 15% to  decrease severity of disability.  Baseline: 79.5% Goal status:  INITIAL  LONG TERM GOALS: Target date: 01/22/2024  Patient will be independent with advanced HEP.  Baseline:  Goal status: INITIAL  2.  Patient will decrease her QuickDASH score to by at least 25-30% to improve function and activity tolerance d/t disability. Baseline: 79.5% Goal status: INITIAL  3.  Patient will be able to perform overhead activities such as washing hair, retrieving items from cabinets to improve independent function at home. Baseline: pt is having difficulty with above activities  Goal status: INITIAL  4.  Patient will report at least 50% improvement in R shoulder pain to improve QoL.  Baseline: 5-7/10 Goal status: INITIAL  5.  Patient will obtain at least 3+/5 MMT of UE for available range to increase activity tolerance.   Baseline: Unable to assess Goal status: INITIAL     PLAN: PT FREQUENCY: 2x/week  PT DURATION: 12 weeks  PLANNED INTERVENTIONS: 97164- PT Re-evaluation, 97750- Physical Performance Testing, 97110-Therapeutic exercises, 97530- Therapeutic activity, [id number]- Neuromuscular re-education, 97535- Self Care, 02859- Manual therapy, [phone]- Gait training, [phone]- Electrical stimulation (unattended), [phone]- Ultrasound, 02987- Traction (mechanical), [phone] (1-2 muscles), [id number] (3+ muscles)- Dry Needling, Patient/Family education, Taping, Joint mobilization, Cryotherapy, and Moist heat  PLAN FOR NEXT SESSION: Gentle R shoulder P/AA/AROM within pain free motion; progress isometric strengthening (within pain free levels); review/revise HEP as indicated; may use Reverse Total Shoulder Arthroplasty for Fracture Protocol for guidance (Sx 09/07/23) - post-op week #11 as of 11/23/23   Sol LITTIE Gaskins, PTA 11/27/2023, 12:50 PM

## 2023-11-29 DIAGNOSIS — Z96611 Presence of right artificial shoulder joint: Secondary | ICD-10-CM | POA: Diagnosis not present

## 2023-11-30 ENCOUNTER — Ambulatory Visit

## 2023-11-30 ENCOUNTER — Other Ambulatory Visit: Payer: Self-pay

## 2023-11-30 DIAGNOSIS — R252 Cramp and spasm: Secondary | ICD-10-CM

## 2023-11-30 DIAGNOSIS — G8929 Other chronic pain: Secondary | ICD-10-CM

## 2023-11-30 DIAGNOSIS — M6281 Muscle weakness (generalized): Secondary | ICD-10-CM

## 2023-11-30 DIAGNOSIS — M25611 Stiffness of right shoulder, not elsewhere classified: Secondary | ICD-10-CM

## 2023-11-30 DIAGNOSIS — M25511 Pain in right shoulder: Secondary | ICD-10-CM

## 2023-11-30 DIAGNOSIS — S46211A Strain of muscle, fascia and tendon of other parts of biceps, right arm, initial encounter: Secondary | ICD-10-CM

## 2023-11-30 NOTE — Therapy (Signed)
 OUTPATIENT PHYSICAL THERAPY TREATMENT/PROGRESS REPORT Progress Note Reporting Period  10/30/23 to 11/30/23  See note below for Objective Data and Assessment of Progress/Goals.       Patient Name: Faith Massey MRN: [id number] DOB:1945-10-28, 78 y.o., female Today's Date: 11/30/2023  END OF SESSION:  PT End of Session - 11/30/23 1108     Visit Number 10    Date for PT Re-Evaluation 01/22/24    Authorization Type [employer]    Progress Note Due on Visit 20    PT Start Time 1100    PT Stop Time 1145    PT Time Calculation (min) 45 min    Activity Tolerance Patient tolerated treatment well    Behavior During Therapy [facility] for tasks assessed/performed                  Past Medical History:  Diagnosis Date   Arthritis    Breast CA (HCC)    Chest mass 03/28/2022   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 10/16/2012   Pneumonia    Pre-diabetes    Prediabetes 05/25/2022   Pyelonephritis 11/10/2022   Right knee injury 02/14/2017   S/P radiation therapy 05/09/2023   S/P Robotic Assisted Right Video Thoracoscopy with resection of Thymus 04/25/2022   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   Past Surgical History:  Procedure Laterality Date   ABDOMINAL HYSTERECTOMY  03/22/2003   BACK SURGERY  03/21/1998   BREAST SURGERY     reconstruction   colon susrgery      CYSTOSCOPY WITH STENT  PLACEMENT N/A 02/08/2023   Procedure: POSSIBLE CYSTOSCOPY WITH URETERAL STENT PLACEMENT;  Surgeon: Alvaro Ricardo KATHEE Mickey., MD;  Location: [facility];  Service: Urology;  Laterality: N/A;   LEFT HEART CATH AND CORONARY ANGIOGRAPHY N/A 07/20/2023   Procedure: LEFT HEART CATH AND CORONARY ANGIOGRAPHY;  Surgeon: Verlin Lonni BIRCH, MD;  Location: [facility];  Service: Cardiovascular;  Laterality: N/A;   MASTECTOMY Bilateral 03/21/1993   RESECTION OF A THYMOMA     04-25-22   REVISION TOTAL SHOULDER TO REVERSE TOTAL SHOULDER Right 09/07/2023   Procedure: REVISION, REVERSE TOTAL ARTHROPLASTY, SHOULDER;  Surgeon: Melita Drivers, MD;  Location: [facility];  Service: Orthopedics;  Laterality: Right;  HEMI ARTHROPLASTY, BONEGRAFT GLENOID   right total shoulder     Patient Active Problem List   Diagnosis Date Noted   S/P shoulder hemiarthroplasty, 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   Abnormal EKG 07/21/2023   Chest pain 07/20/2023   Preoperative cardiovascular examination 07/19/2023   Arthritis    Breast CA (HCC)    Hyperlipidemia    Laceration of muscle, fascia and tendon of long head of biceps, unspecified arm, initial encounter    Myasthenia gravis (HCC)    Pneumonia    S/P radiation therapy 05/09/2023   Hoarseness of voice 05/09/2023   Diverticular disease 02/08/2023  History of pulmonary embolism 12/22/2022   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   Pyelonephritis 11/10/2022   Prediabetes 05/25/2022   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   S/P Robotic Assisted Right Video Thoracoscopy with resection of Thymus 04/25/2022   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   Myasthenic syndrome (HCC) 04/12/2022   CVA (cerebral vascular accident) (HCC) 03/28/2022   HTN (hypertension) 03/28/2022   Chest mass 03/28/2022   Right knee injury 02/14/2017   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 10/16/2012   Osteoporosis 10/12/2012    PCP: Watt Harlene BROCKS, MD   REFERRING PROVIDER: Melita Drivers, MD   REFERRING DIAG:  [phone] (ICD-10-CM) - Presence of right artificial shoulder joint   THERAPY DIAG:  Acute pain of right shoulder  Stiffness of right shoulder, not elsewhere classified  Muscle weakness (generalized)  Cramp and spasm  Chronic right shoulder pain  Rupture of right proximal biceps tendon, initial encounter  RATIONALE FOR EVALUATION AND TREATMENT: Rehabilitation  ONSET DATE: Post-Op R TSA revision/conversion to hemi-arthroplasty on 09/07/2023   NEXT MD VISIT: 11/29/2023    SUBJECTIVE:                                                                                                                                                                                      SUBJECTIVE STATEMENT: Saw PA at orthopedic surgeon for follow up, the PA recommended shoulder pulley for home, with slow, controlled movements.  Not much pain.    Hand dominance: Right  PERTINENT HISTORY: MG, h/o radiation (March and May 2024) for mediastinal mass, Breast CA, HTN, OP, R TSA (09/07/23)  PAIN:  Are you having pain? Yes: NPRS scale: intermittently up to 5-6/10 Pain location: R anterior glenoid, scapular region  Pain description: mostly aching  Aggravating factors: reach fwd too much Relieving factors: Ice, medication   PRECAUTIONS:  Shoulder: physical therapist referral - Evaluate and treat per protocol status post revision right reverse total shoulder arthroplasty to CTA hemiarthroplasty, DOS 09/07/23 Patient unfortunately had a fracture at the base of the glenosphere making it unstable and requiring removal and conversion to hemiarthroplasty. Please adjust her physical therapy to be done under a very slow and steady and gradual advance of range of motion and gradual use of the right upper extremity within her pain tolerance. Please call me for any additional questions  RED FLAGS: None   WEIGHT BEARING RESTRICTIONS: No  FALLS:  Has patient fallen in last 6 months? Yes. Number of falls a week and a half  ago, feel onto buttock while pulling weeds out of yard   LIVING ENVIRONMENT: Lives with: lives alone Lives in: House/apartment Stairs: No Has following equipment at home:  Single point cane, shower chair, Grab bars, and walking stick   OCCUPATION: Retired   PLOF: Independent with household mobility with device and Leisure: watching tv, reading, walking her dog, social gatherings w/ friends to play card games   PATIENT GOALS: Get my shoulder to functioning, lifting shoulder, bearing weight   OBJECTIVE:  Note: Objective measures were completed at Evaluation unless otherwise noted.  DIAGNOSTIC FINDINGS:  09/20/2022: R shoulder XR  Results: Degenerative changes of the before meals and glenohumeral joints. No other acute findings.   PATIENT SURVEYS :  QuickDASH Score: 79.5 / 100 = 79.5 %  COGNITION: Overall cognitive status: Within functional limits for tasks assessed     SENSATION: WFL  POSTURE: Rounded shoulders, fwd head   UPPER EXTREMITY ROM:  LUE: Grossly WFL   Passive ROM Right eval R 11/02/23 R 11/08/23 11/27/23 R 11/30/23 R  Shoulder flexion Limited, p! Less than 90 (around 60) 84 90 120 120  Shoulder extension       Shoulder abduction Limited less than 90 74 scaption 80 scaption 100 100  Shoulder adduction       Shoulder internal rotation Limited p! 35 at 30 scaption     Shoulder external rotation Limitied p! 45 at 30 scaption 50  64 73  (Blank rows = not tested)  Active ROM Right  Shoulder flexion   Shoulder extension   Shoulder abduction   Shoulder adduction   Shoulder extension   Shoulder internal rotation   Shoulder external rotation    (Blank rows = not tested)   UPPER EXTREMITY MMT:  MMT Right eval Left eval  Shoulder flexion  4-  Shoulder extension    Shoulder abduction  4+  Shoulder adduction    Shoulder internal rotation  4-  Shoulder external rotation  4-  Middle trapezius    Lower trapezius    Elbow flexion  5  Elbow extension  4+   Wrist flexion    Wrist extension    Wrist ulnar deviation    Wrist radial deviation    Wrist pronation    Wrist supination    Grip strength (lbs)    (Blank rows = not tested) 11/30/23:  R shoulder flexion active to 56 degrees R shoulder ER 3-/5  SHOULDER SPECIAL TESTS: Not Performed   JOINT MOBILITY TESTING:  Not Performed   PALPATION:  TTP: R ant glenoid, collar bone area (tender, not painful)                                                                                                                              TREATMENT DATE:  11/30/23:  Reassessed for 10th visit progress report Progressed therex according to pts tolerance and to protocol, now 12 weeks post op Shoulder pulley x 3 min flexion and scaption, slow Supine for serratus punches, initially with therapist supporting R arm and then with yard stick Supine for chest presses with yard stick Supine L shoulder ER  with yellow theraband, stabilizing, static position R shoulder Side lying L for R shoulder ER Side lying L for R shoulder flexion Updated HEP below  11/27/2023 THERAPEUTIC EXERCISE: To improve strength, endurance, ROM, and flexibility.  Demonstration, verbal and tactile cues throughout for technique.  Pulleys: Flexion & Scaption x 3 min each  Standing R shoulder flexion/scaption wall slides x 5 - AAROM required with L hand at elbow- limited today Standing R shoulder flexion counter slides with 2# weight on washcloth 2 x 10 Standing R shoulder scaption counter slides with 2# weight on washcloth 2 x 10 Seated hand clasped AAROM shoulder flexion x 5   NEUROMUSCULAR RE-EDUCATION: To improve coordination, kinesthesia, and posture. R shoulder flexion YTB reactive isometric step-outs x 10 (neutral shoulder with elbow flexed to 90) R shoulder extension YTB reactive isometric step-outs x 10 (neutral shoulder with elbow flexed to 90) - cues for scapular retraction activation to stabilize shoulder R shoulder IR YTB  reactive isometric step-outs x 10 (neutral shoulder with elbow flexed to 90) R shoulder ER YTB reactive isometric step-outs x 10 (neutral shoulder with elbow flexed to 90) - cues for scapular retraction activation to stabilize shoulder 11/23/2023 THERAPEUTIC EXERCISE: To improve strength, endurance, ROM, and flexibility.  Demonstration, verbal and tactile cues throughout for technique.  Pulleys: Flexion & Scaption x 3 min each  Standing R shoulder flexion/scaption wall slides x 10 - AAROM required with L hand at elbow Standing R shoulder flexion counter slides with 2# weight on washcloth 2 x 10 Standing R shoulder scaption counter slides with 2# weight on washcloth 2 x 10 Seated B shoulder orange Pball flexion & scaption walk-out x 10 each (attempted initially on wall but deferred d/t excessive R shoulder shrugging  NEUROMUSCULAR RE-EDUCATION: To improve coordination, kinesthesia, and posture. R shoulder flexion YTB reactive isometric step-outs x 5 (neutral shoulder with elbow flexed to 90) R shoulder extension YTB reactive isometric step-outs x 5 (neutral shoulder with elbow flexed to 90) - cues for scapular retraction activation to stabilize shoulder R shoulder IR YTB reactive isometric step-outs x 5 (neutral shoulder with elbow flexed to 90) R shoulder ER YTB reactive isometric step-outs x 5 (neutral shoulder with elbow flexed to 90) - cues for scapular retraction activation to stabilize shoulder   11/21/2023  THERAPEUTIC EXERCISE: To improve strength, endurance, ROM, and flexibility.  Demonstration, verbal and tactile cues throughout for technique. Pulleys: Flexion & Scaption x 3 min each  Supine for gentle R shoulder PROM/stretching all planes Supine flexed elbow R shoulder flexion/scaption (uppercut) AA/AROM 2 x 10 - somewhat better tolerance for AROM but still slight guiding from PT initially with motion  MANUAL THERAPY: To promote normalized muscle tension, improved flexibility,  improved joint mobility, and reduced pain utilizing connective tissue massage, therapeutic massage, manual TP therapy, and scar mobilization.  STM/XFM to R shoulder surgical incision Supine gentle R shoulder oscillation and grade I-II inferior glide for pain relief/muscle relxation  NEUROMUSCULAR RE-EDUCATION: To improve coordination, kinesthesia, and posture. S/L R shoulder ER maintaining mini-pillow under axilla and R elbow at 90 degrees 2 x 10 - VC & TC for scapular engagement, avoiding shoulder shrug S/L R shoulder flexion - initially attempted with elbow straight but transitioned to R elbow at 90 degrees x 10  SELF CARE:  Provided education on sleeping position and use of pillow(s) to support R arm.    11/17/2023  THERAPEUTIC EXERCISE: To improve ROM and flexibility.  Demonstration, verbal and tactile cues throughout for technique.  Pulleys:  Flexion & Scaption x 3 min each  Supine for gentle R shoulder PROM/stretching all planes Supine flexed elbow R shoulder flexion/scaption (uppercut) AA/AROM 2 x 10 - AAROM d/t increased discomfort with AROM  MANUAL THERAPY: To promote normalized muscle tension, improved flexibility, improved joint mobility, and reduced pain utilizing connective tissue massage, therapeutic massage, manual TP therapy, and scar mobilization.  STM/XFM to R shoulder surgical incision STM/DTM and manual TPR to R pecs  NEUROMUSCULAR RE-EDUCATION: To improve coordination, kinesthesia, and posture. S/L R shoulder ER maintaining towel roll under axilla and R elbow at 90 degrees 2 x 10 - VC & TC for scapular engagement S/L R shoulder flexion - initially attempted with elbow straight but transitioned to R elbow at 90 degrees x 10 Seated YTB scap retraction + B shoulder row x 10 Seated YTB scap retraction + B shoulder extension stopping shy of neutral x 10   11/14/23: Pulleys 3 min flexion B Supine for gentle stretching R shoulder all planes Supine with R humerus supported  in line with trunk for manually resisted R shoulder isometric inv/ev mass practice Side lying L for R shoulder ER maintaining towel roll under axilla and R elbow at 90 degrees Standing with R elbow supported on counter, pulling towel with 6# on towel from flexed position to neutral, then advance to sitting in chair for yellow t band rows, needed frequent cues to avoid R shoulder extension past trunk, had pt slide back in chair so that the seat back blocked her from extending R shoulder    11/08/2023  THERAPEUTIC EXERCISE: To improve ROM and flexibility.  Demonstration, verbal and tactile cues throughout for technique.  Pulleys: Flexion x 3 min, Scaption x 3 min (scaption better tolerated today) Standing counter wash AAROM LUE: Flexion x 20 Scaption x 20 R shoulder PROM within pain tolerance for flexion, scaption, IR and ER Measured PROM Shoulder isometrics: flex, abd, ER, ext 5x5 Standing YTB scap retraction + B shoulder row- clicking clicking at first but after readjusting no clicking  Standing YTB scap retraction + B shoulder extension stopping shy of neutral x 10   11/06/2023  THERAPEUTIC EXERCISE: To improve ROM and flexibility.  Demonstration, verbal and tactile cues throughout for technique.  Pulleys: Flexion x 3 min, Scaption x 3 min (scaption better tolerated today) Seated R shoulder AAROM with Swiffer: Flexion 2 x 10 Scaption 2 x 10 R shoulder PROM by PT within pain tolerance for flexion, scaption, IR and ER  Hooklying R shoulder AAROM with cane: Flexion 2 x 10 Scaption x 10 - pt needing PT guidance/assistance for proper movement pattern  NEUROMUSCULAR RE-EDUCATION: To improve coordination, kinesthesia, posture, and proprioception. R shoulder rhythmic stabilization at 90 flexion (1-finger perturbations) 2 x 15-20 sec R shoulder protraction AAROM with wand 2 x 10 Standing YTB scap retraction + B shoulder row x 10 Standing YTB scap retraction + B shoulder extension stopping  shy of neutral x 10   11/02/2023  THERAPEUTIC EXERCISE: To improve strength, endurance, ROM, and flexibility.  Demonstration, verbal and tactile cues throughout for technique.  Pulleys: Flexion x 3 min, Scaption x 1.5 min (discontinued d/t increasing discomfort with scaption) Table slides standing at counter top:  Flexion 2 x 10 Scaption 2 x 10 Seated R shoulder AAROM with Swiffer: Flexion 2 x 10 Scaption 2 x 10 R shoulder PROM by PT within pain tolerance for flexion, scaption, IR and ER (ER limited to ~45 per protocol) Standing R shoulder submax (~25% effort) isometrics into wall  5 x 5 - flexion, abduction, extension, IR & ER Standing scapular retraction 10 x 5  SELF CARE:  Yellow Theraputty provided for home grip strengthening   10/30/2023 SELF CARE:  Reviewed eval findings and role of PT in addressing identified deficits as well as instruction in initial HEP (see below).    PATIENT EDUCATION:  Education details: HEP progression - YTB reactive isometrics  Person educated: Patient Education method: Explanation, Demonstration, Verbal cues, Tactile cues, and MedBridgeGO app updated Education comprehension: verbalized understanding, returned demonstration, verbal cues required, tactile cues required, and needs further education  HOME EXERCISE PROGRAM: Access Code: [email or web address] URL: [email or web address] Date: 11/30/2023 Prepared by: Greig Ailed Defibaugh  Exercises - Sidelying Shoulder External Rotation  - 1 x daily - 3 x weekly - 3 sets - 10 reps - Sidelying Shoulder Flexion 15 Degrees  - 1 x daily - 3 x weekly - 2 sets - 10 reps - 3 sec hold - Seated Shoulder Extension and Scapular Retraction with Resistance  - 1 x daily - 3 x weekly - 2 sets - 10 reps - 3-5 sec hold - Seated Shoulder Row with Anchored Resistance  - 1 x daily - 3 x weekly - 2 sets - 10 reps - 3-5 hold hold - Shoulder Scar Massage  - 1-2 x daily - 7 x weekly - 1-2 min hold - Supine Shoulder Protraction with  Dowel  - 1 x daily - 7 x weekly - 3 sets - 10 reps - Supine Shoulder Press with Dowel  - 1 x daily - 7 x weekly - 3 sets - 10 reps - Supine Shoulder External Rotation with Resistance  - 1 x daily - 7 x weekly - 3 sets - 10 reps Access Code: [email or web address] URL: [email or web address] Date: 11/17/2023 Prepared by: Elijah Hidden  Exercises - Seated Shoulder Flexion Towel Slide at Table Top  - 1 x daily - 7 x weekly - 3 sets - 10 reps - Standing Single Arm Shoulder Flexion Towel Slide at Table Top  - 1 x daily - 7 x weekly - 3 sets - 10 reps - Circular Shoulder Pendulum with Table Support  - 1 x daily - 7 x weekly - 3 sets - 10 reps - Isometric Shoulder Flexion at Wall  - 1 x daily - 3 x weekly - 2 sets - 5 reps - 5 sec hold - Isometric Shoulder Abduction at Wall  - 1 x daily - 3 x weekly - 2 sets - 5 reps - 5 sec hold - Isometric Shoulder Extension at Wall  - 1 x daily - 3 x weekly - 2 sets - 5 reps - 5 sec hold - Standing Isometric Shoulder Internal Rotation at Doorway  - 1 x daily - 3 x weekly - 2 sets - 5 reps - 5 sec hold - Standing Isometric Shoulder External Rotation with Doorway  - 1 x daily - 3 x weekly - 2 sets - 5 reps - 5 sec hold - Standing Scapular Retraction  - 1 x daily - 7 x weekly - 2 sets - 10 reps - 3-5 sec hold - Sidelying Shoulder External Rotation  - 1 x daily - 3 x weekly - 3 sets - 10 reps - Sidelying Shoulder Flexion 15 Degrees  - 1 x daily - 3 x weekly - 2 sets - 10 reps - 3 sec hold - Seated Shoulder Extension and Scapular Retraction with Resistance  - 1 x daily - 3 x weekly - 2 sets -  10 reps - 3-5 sec hold - Seated Shoulder Row with Anchored Resistance  - 1 x daily - 3 x weekly - 2 sets - 10 reps - 3-5 hold hold - Shoulder Scar Massage  - 1-2 x daily - 7 x weekly - 1-2 min hold   ASSESSMENT:  CLINICAL IMPRESSION: Sonnie reports some improvement in her R shoulder flexion for functional activities.  Today we advanced, updated her HEP to engage scapular  musculature and for strengthening ant R shoulder.  She tolerated well. Still needs cues to avoid over exertion/ use.    Bebe will benefit from continued skilled PT to address ongoing ROM and strength deficits to improve functional UE use and activity tolerance with decreased pain interference.   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 Pt reports that she has orders to limit R arm use to waist level only, avoid reaching overhead at this time. She is having difficulty with forward and overhead reaching for items, opening cans w/ R hand, washing hair, and driving. She states that she has learned ways to compensate using her left arm even though she is R hand dominant. Today's assessment was done with all items remaining within patient's tolerance. PROM revealed limitations in flexion, IR, ER, and abd as movement was limited by both pain and muscular guarding. Pt had difficulty fully relaxing the R arm for PROM today and displayed hesitancy to movement beyond waist side. Pt was educated on the importance of taking recovery slow at this point (as advised) and avoid heavy lifting and resistance training after asking to try bicep curls with a small weight at home. Pt is aware that at this stage, it is important to focus of restoring as much ROM as she can and gradually progressing towards more strengthening activities with therapy. Pt scored a 79.5% on the QuickDASH representing severe disability of the UE in day to day activities.  Jaxsyn will benefit from skilled PT intervention to address current deficits to improve functional ability, mobility, and activity tolerance w/ dec'd pain interference.   OBJECTIVE IMPAIRMENTS: decreased activity tolerance, decreased endurance, decreased knowledge of condition, decreased mobility, decreased ROM, decreased strength, increased  fascial restrictions, impaired perceived functional ability, increased muscle spasms, impaired UE functional use, postural dysfunction, and pain.   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   REHAB POTENTIAL: Good  CLINICAL DECISION MAKING: Evolving/moderate complexity  EVALUATION COMPLEXITY: Moderate  GOALS: Goals reviewed with patient? Yes  SHORT TERM GOALS: Target date: 12/11/2023    Patient will be independent with initial HEP.  Baseline: Goal status: MET - 11/16/23  2.  Patient will report at least 25% improvement in R shoulder pain to improve QoL. Baseline: Worst 5-7/10 Goal status: IN PROGRESS - 11/17/23 - pt noting shoulder more irritable over past week  3.  Patient will improve R shoulder flexion PROM to 90 or better to aid in difficulty with forward reaching activities.  Baseline: limited by pain ~60 deg of  flexion  Goal status: MET - 11/08/23 - no pain  4.  Patient will decrease her QuickDASH score by at least 15% to decrease severity of disability.  Baseline: 79.5% Goal status:  INITIAL  LONG TERM GOALS: Target date: 01/22/2024  Patient will be independent with advanced HEP.  Baseline:  Goal status: 11/30/23: progressing  2.  Patient will decrease her QuickDASH score to by at least 25-30% to improve function and activity tolerance d/t disability. Baseline: 79.5% Goal status: 11/30/23: QuickDASH Score: 45.5 / 100 = 45.5 %  3.  Patient will be able to  perform overhead activities such as washing hair, retrieving items from cabinets to improve independent function at home. Baseline: pt is having difficulty with above activities  Goal status:11/30/23 progressing not met  4.  Patient will report at least 50% improvement in R shoulder pain to improve QoL.  Baseline: 5-7/10 Goal status: 11/30/23:  0 at rest, up to 3 with use , met so far  5.  Patient will obtain at least 3+/5 MMT of UE for available range to increase activity tolerance.   Baseline: Unable to assess Goal status: 11/30/23: progressing  PLAN: PT FREQUENCY: 2x/week  PT DURATION: 12 weeks  PLANNED INTERVENTIONS: [id number]- PT Re-evaluation, 97750- Physical Performance Testing, 97110-Therapeutic exercises, 97530- Therapeutic activity, [id number]- Neuromuscular re-education, 97535- Self Care, [id number]- Manual therapy, [id number]- Gait training, [phone]- Electrical stimulation (unattended), [id number]- Ultrasound, 02987- Traction (mechanical), 20560 (1-2 muscles), 20561 (3+ muscles)- Dry Needling, Patient/Family education, Taping, Joint mobilization, Cryotherapy, and Moist heat  PLAN FOR NEXT SESSION: Gentle R shoulder P/AA/AROM within pain free motion; ; review/revise HEP as indicated; may use Reverse Total Shoulder Arthroplasty for Fracture Protocol for guidance (Sx 09/07/23) - post-op week #12 as of 11/23/23   Dawood Spitler L Shamecca Whitebread, PT, DPT, OCS 11/30/2023, 11:09 AM

## 2023-12-05 ENCOUNTER — Encounter

## 2023-12-07 ENCOUNTER — Other Ambulatory Visit: Payer: Self-pay

## 2023-12-07 ENCOUNTER — Ambulatory Visit

## 2023-12-07 DIAGNOSIS — M25511 Pain in right shoulder: Secondary | ICD-10-CM | POA: Diagnosis not present

## 2023-12-07 DIAGNOSIS — M6281 Muscle weakness (generalized): Secondary | ICD-10-CM

## 2023-12-07 DIAGNOSIS — M25611 Stiffness of right shoulder, not elsewhere classified: Secondary | ICD-10-CM

## 2023-12-07 NOTE — Therapy (Signed)
 OUTPATIENT PHYSICAL THERAPY TREATMENT   See note below for Objective Data and Assessment of Progress/Goals.       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 10/16/2012   Pneumonia    Pre-diabetes    Prediabetes 05/25/2022   Pyelonephritis 11/10/2022   Right knee injury 02/14/2017   S/P radiation therapy 05/09/2023   S/P Robotic Assisted Right Video Thoracoscopy with resection of Thymus 04/25/2022   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   Past Surgical History:  Procedure Laterality Date   ABDOMINAL HYSTERECTOMY  03/22/2003   BACK SURGERY  03/21/1998   BREAST SURGERY     reconstruction   colon susrgery      CYSTOSCOPY WITH STENT PLACEMENT N/A 02/08/2023   Procedure: POSSIBLE CYSTOSCOPY WITH URETERAL STENT PLACEMENT;  Surgeon: Alvaro Ricardo KATHEE Mickey., MD;  Location: [facility];  Service: Urology;  Laterality: N/A;   LEFT HEART CATH  AND CORONARY ANGIOGRAPHY N/A 07/20/2023   Procedure: LEFT HEART CATH AND CORONARY ANGIOGRAPHY;  Surgeon: Verlin Lonni BIRCH, MD;  Location: [facility];  Service: Cardiovascular;  Laterality: N/A;   MASTECTOMY Bilateral 03/21/1993   RESECTION OF A THYMOMA     04-25-22   REVISION TOTAL SHOULDER TO REVERSE TOTAL SHOULDER Right 09/07/2023   Procedure: REVISION, REVERSE TOTAL ARTHROPLASTY, SHOULDER;  Surgeon: Melita Drivers, MD;  Location: [facility];  Service: Orthopedics;  Laterality: Right;  HEMI ARTHROPLASTY, BONEGRAFT GLENOID   right total shoulder     Patient Active Problem List   Diagnosis Date Noted   S/P shoulder hemiarthroplasty, 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   Abnormal EKG 07/21/2023   Chest pain 07/20/2023   Preoperative cardiovascular examination 07/19/2023   Arthritis    Breast CA (HCC)    Hyperlipidemia    Laceration of muscle, fascia and tendon of long head of biceps, unspecified arm, initial encounter    Myasthenia gravis (HCC)    Pneumonia    S/P radiation therapy 05/09/2023   Hoarseness of voice 05/09/2023   Diverticular disease 02/08/2023   History of pulmonary embolism 12/22/2022   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   Pyelonephritis 11/10/2022   Prediabetes 05/25/2022   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   S/P Robotic Assisted Right Video Thoracoscopy with resection of  Thymus 04/25/2022   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   Myasthenic syndrome (HCC) 04/12/2022   CVA (cerebral vascular accident) (HCC) 03/28/2022   HTN (hypertension) 03/28/2022   Chest mass 03/28/2022   Right knee injury 02/14/2017   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 10/16/2012   Osteoporosis 10/12/2012    PCP: Watt Harlene BROCKS, MD   REFERRING PROVIDER: Melita Drivers, MD   REFERRING DIAG: [phone] (ICD-10-CM) - Presence of right artificial shoulder joint   THERAPY DIAG:  Muscle weakness (generalized)  Acute pain of right shoulder  Stiffness of right shoulder, not elsewhere  classified  RATIONALE FOR EVALUATION AND TREATMENT: Rehabilitation  ONSET DATE: Post-Op R TSA revision/conversion to hemi-arthroplasty on 09/07/2023   NEXT MD VISIT: 11/29/2023    SUBJECTIVE:                                                                                                                                                                                      SUBJECTIVE STATEMENT: I've gotten the shoulder pulley, I've been trying to use like they told me in the orthopedists office, to lift with my L hand, and then control lowering it with my R hand, I'm really sore   Hand dominance: Right  PERTINENT HISTORY: MG, h/o radiation (March and May 2024) for mediastinal mass, Breast CA, HTN, OP, R TSA (09/07/23)  PAIN:  Are you having pain? Yes: NPRS scale: intermittently up to 5-6/10 Pain location: R anterior glenoid, scapular region  Pain description: mostly aching  Aggravating factors: reach fwd too much Relieving factors: Ice, medication   PRECAUTIONS:  Shoulder: physical therapist referral - Evaluate and treat per protocol status post revision right reverse total shoulder arthroplasty to CTA hemiarthroplasty, DOS 09/07/23 Patient unfortunately had a fracture at the base of the glenosphere making it unstable and requiring removal and conversion to hemiarthroplasty. Please adjust her physical therapy to be done under a very slow and steady and gradual advance of range of motion and gradual use of the right upper extremity within her pain tolerance. Please call me for any additional questions  RED FLAGS: None   WEIGHT BEARING RESTRICTIONS: No  FALLS:  Has patient fallen in last 6 months? Yes. Number of falls a week and a half ago, feel onto buttock while pulling weeds out of yard   LIVING ENVIRONMENT: Lives with: lives alone Lives in: House/apartment Stairs: No Has following equipment at home: Single point cane, shower chair, Grab bars, and walking stick    OCCUPATION: Retired   PLOF: Independent with household mobility with device and Leisure: watching tv, reading, walking her dog, social gatherings w/ friends to play card games   PATIENT  GOALS: Get my shoulder to functioning, lifting shoulder, bearing weight   OBJECTIVE:  Note: Objective measures were completed at Evaluation unless otherwise noted.  DIAGNOSTIC FINDINGS:  09/20/2022: R shoulder XR  Results: Degenerative changes of the before meals and glenohumeral joints. No other acute findings.   PATIENT SURVEYS :  QuickDASH Score: 79.5 / 100 = 79.5 %  COGNITION: Overall cognitive status: Within functional limits for tasks assessed     SENSATION: WFL  POSTURE: Rounded shoulders, fwd head   UPPER EXTREMITY ROM:  LUE: Grossly WFL   Passive ROM Right eval R 11/02/23 R 11/08/23 11/27/23 R 11/30/23 R  Shoulder flexion Limited, p! Less than 90 (around 60) 84 90 120 120  Shoulder extension       Shoulder abduction Limited less than 90 74 scaption 80 scaption 100 100  Shoulder adduction       Shoulder internal rotation Limited p! 35 at 30 scaption     Shoulder external rotation Limitied p! 45 at 30 scaption 50  64 73  (Blank rows = not tested)  Active ROM Right  Shoulder flexion   Shoulder extension   Shoulder abduction   Shoulder adduction   Shoulder extension   Shoulder internal rotation   Shoulder external rotation    (Blank rows = not tested)   UPPER EXTREMITY MMT:  MMT Right eval Left eval  Shoulder flexion  4-  Shoulder extension    Shoulder abduction  4+  Shoulder adduction    Shoulder internal rotation  4-  Shoulder external rotation  4-  Middle trapezius    Lower trapezius    Elbow flexion  5  Elbow extension  4+  Wrist flexion    Wrist extension    Wrist ulnar deviation    Wrist radial deviation    Wrist pronation    Wrist supination    Grip strength (lbs)    (Blank rows = not tested) 11/30/23:  R shoulder flexion active to 56 degrees R  shoulder ER 3-/5  SHOULDER SPECIAL TESTS: Not Performed   JOINT MOBILITY TESTING:  Not Performed   PALPATION:  TTP: R ant glenoid, collar bone area (tender, not painful)                                                                                                                              TREATMENT DATE:  12/07/23:  Shoulder pulley flex, scaption, 2 min each.  The patient has been eccentrically lowering her R shoulder with the pulleys, advised her to reduce her engagement of musculature with eccentric lowering to 25% vs a maximal contraction that she has been utilizing. Supine with towel roll supporting R post humerus, for gentle ROM R shoulder, with intermittent retrograde massage.  Assisted R scapular/serratus punches 10x Supine chest press with yard stick 10x Supine manually resisted isometrics for R shoulder ER 10 reps, 5 sec holds Side lying L for R shoulder isometric adduction, towel roll in axilla,  5 sec holds, 10 reps  Side lying L for R shoulder ER 10 reps , pt reported soreness so stopped Side lying for assisted R shoulder flexion 10 reps, therapist supported under R hand    11/30/23:  Reassessed for 10th visit progress report Progressed therex according to pts tolerance and to protocol, now 12 weeks post op Shoulder pulley x 3 min flexion and scaption, slow Supine for serratus punches, initially with therapist supporting R arm and then with yard stick Supine for chest presses with yard stick Supine L shoulder ER with yellow theraband, stabilizing, static position R shoulder Side lying L for R shoulder ER Side lying L for R shoulder flexion Updated HEP below  11/27/2023 THERAPEUTIC EXERCISE: To improve strength, endurance, ROM, and flexibility.  Demonstration, verbal and tactile cues throughout for technique.  Pulleys: Flexion & Scaption x 3 min each  Standing R shoulder flexion/scaption wall slides x 5 - AAROM required with L hand at elbow- limited today Standing R  shoulder flexion counter slides with 2# weight on washcloth 2 x 10 Standing R shoulder scaption counter slides with 2# weight on washcloth 2 x 10 Seated hand clasped AAROM shoulder flexion x 5   NEUROMUSCULAR RE-EDUCATION: To improve coordination, kinesthesia, and posture. R shoulder flexion YTB reactive isometric step-outs x 10 (neutral shoulder with elbow flexed to 90) R shoulder extension YTB reactive isometric step-outs x 10 (neutral shoulder with elbow flexed to 90) - cues for scapular retraction activation to stabilize shoulder R shoulder IR YTB reactive isometric step-outs x 10 (neutral shoulder with elbow flexed to 90) R shoulder ER YTB reactive isometric step-outs x 10 (neutral shoulder with elbow flexed to 90) - cues for scapular retraction activation to stabilize shoulder 11/23/2023 THERAPEUTIC EXERCISE: To improve strength, endurance, ROM, and flexibility.  Demonstration, verbal and tactile cues throughout for technique.  Pulleys: Flexion & Scaption x 3 min each  Standing R shoulder flexion/scaption wall slides x 10 - AAROM required with L hand at elbow Standing R shoulder flexion counter slides with 2# weight on washcloth 2 x 10 Standing R shoulder scaption counter slides with 2# weight on washcloth 2 x 10 Seated B shoulder orange Pball flexion & scaption walk-out x 10 each (attempted initially on wall but deferred d/t excessive R shoulder shrugging  NEUROMUSCULAR RE-EDUCATION: To improve coordination, kinesthesia, and posture. R shoulder flexion YTB reactive isometric step-outs x 5 (neutral shoulder with elbow flexed to 90) R shoulder extension YTB reactive isometric step-outs x 5 (neutral shoulder with elbow flexed to 90) - cues for scapular retraction activation to stabilize shoulder R shoulder IR YTB reactive isometric step-outs x 5 (neutral shoulder with elbow flexed to 90) R shoulder ER YTB reactive isometric step-outs x 5 (neutral shoulder with elbow flexed to 90) -  cues for scapular retraction activation to stabilize shoulder   11/21/2023  THERAPEUTIC EXERCISE: To improve strength, endurance, ROM, and flexibility.  Demonstration, verbal and tactile cues throughout for technique. Pulleys: Flexion & Scaption x 3 min each  Supine for gentle R shoulder PROM/stretching all planes Supine flexed elbow R shoulder flexion/scaption (uppercut) AA/AROM 2 x 10 - somewhat better tolerance for AROM but still slight guiding from PT initially with motion  MANUAL THERAPY: To promote normalized muscle tension, improved flexibility, improved joint mobility, and reduced pain utilizing connective tissue massage, therapeutic massage, manual TP therapy, and scar mobilization.  STM/XFM to R shoulder surgical incision Supine gentle R shoulder oscillation and grade I-II inferior glide for pain relief/muscle relxation  NEUROMUSCULAR RE-EDUCATION: To improve coordination, kinesthesia, and posture. S/L R shoulder ER maintaining mini-pillow under axilla and R elbow at 90 degrees 2 x 10 - VC & TC for scapular engagement, avoiding shoulder shrug S/L R shoulder flexion - initially attempted with elbow straight but transitioned to R elbow at 90 degrees x 10  SELF CARE:  Provided education on sleeping position and use of pillow(s) to support R arm.    11/17/2023  THERAPEUTIC EXERCISE: To improve ROM and flexibility.  Demonstration, verbal and tactile cues throughout for technique.  Pulleys: Flexion & Scaption x 3 min each  Supine for gentle R shoulder PROM/stretching all planes Supine flexed elbow R shoulder flexion/scaption (uppercut) AA/AROM 2 x 10 - AAROM d/t increased discomfort with AROM  MANUAL THERAPY: To promote normalized muscle tension, improved flexibility, improved joint mobility, and reduced pain utilizing connective tissue massage, therapeutic massage, manual TP therapy, and scar mobilization.  STM/XFM to R shoulder surgical incision STM/DTM and manual TPR to R  pecs  NEUROMUSCULAR RE-EDUCATION: To improve coordination, kinesthesia, and posture. S/L R shoulder ER maintaining towel roll under axilla and R elbow at 90 degrees 2 x 10 - VC & TC for scapular engagement S/L R shoulder flexion - initially attempted with elbow straight but transitioned to R elbow at 90 degrees x 10 Seated YTB scap retraction + B shoulder row x 10 Seated YTB scap retraction + B shoulder extension stopping shy of neutral x 10   11/14/23: Pulleys 3 min flexion B Supine for gentle stretching R shoulder all planes Supine with R humerus supported in line with trunk for manually resisted R shoulder isometric inv/ev mass practice Side lying L for R shoulder ER maintaining towel roll under axilla and R elbow at 90 degrees Standing with R elbow supported on counter, pulling towel with 6# on towel from flexed position to neutral, then advance to sitting in chair for yellow t band rows, needed frequent cues to avoid R shoulder extension past trunk, had pt slide back in chair so that the seat back blocked her from extending R shoulder    11/08/2023  THERAPEUTIC EXERCISE: To improve ROM and flexibility.  Demonstration, verbal and tactile cues throughout for technique.  Pulleys: Flexion x 3 min, Scaption x 3 min (scaption better tolerated today) Standing counter wash AAROM LUE: Flexion x 20 Scaption x 20 R shoulder PROM within pain tolerance for flexion, scaption, IR and ER Measured PROM Shoulder isometrics: flex, abd, ER, ext 5x5 Standing YTB scap retraction + B shoulder row- clicking clicking at first but after readjusting no clicking  Standing YTB scap retraction + B shoulder extension stopping shy of neutral x 10   11/06/2023  THERAPEUTIC EXERCISE: To improve ROM and flexibility.  Demonstration, verbal and tactile cues throughout for technique.  Pulleys: Flexion x 3 min, Scaption x 3 min (scaption better tolerated today) Seated R shoulder AAROM with Swiffer: Flexion 2 x  10 Scaption 2 x 10 R shoulder PROM by PT within pain tolerance for flexion, scaption, IR and ER  Hooklying R shoulder AAROM with cane: Flexion 2 x 10 Scaption x 10 - pt needing PT guidance/assistance for proper movement pattern  NEUROMUSCULAR RE-EDUCATION: To improve coordination, kinesthesia, posture, and proprioception. R shoulder rhythmic stabilization at 90 flexion (1-finger perturbations) 2 x 15-20 sec R shoulder protraction AAROM with wand 2 x 10 Standing YTB scap retraction + B shoulder row x 10 Standing YTB scap retraction + B shoulder extension stopping shy of neutral x 10  11/02/2023  THERAPEUTIC EXERCISE: To improve strength, endurance, ROM, and flexibility.  Demonstration, verbal and tactile cues throughout for technique.  Pulleys: Flexion x 3 min, Scaption x 1.5 min (discontinued d/t increasing discomfort with scaption) Table slides standing at counter top:  Flexion 2 x 10 Scaption 2 x 10 Seated R shoulder AAROM with Swiffer: Flexion 2 x 10 Scaption 2 x 10 R shoulder PROM by PT within pain tolerance for flexion, scaption, IR and ER (ER limited to ~45 per protocol) Standing R shoulder submax (~25% effort) isometrics into wall 5 x 5 - flexion, abduction, extension, IR & ER Standing scapular retraction 10 x 5  SELF CARE:  Yellow Theraputty provided for home grip strengthening   10/30/2023 SELF CARE:  Reviewed eval findings and role of PT in addressing identified deficits as well as instruction in initial HEP (see below).    PATIENT EDUCATION:  Education details: HEP progression - YTB reactive isometrics  Person educated: Patient Education method: Explanation, Demonstration, Verbal cues, Tactile cues, and MedBridge[employer] app updated Education comprehension: verbalized understanding, returned demonstration, verbal cues required, tactile cues required, and needs further education  HOME EXERCISE PROGRAM: Access Code: [email or web address] URL:  [email or web address] Date: 11/30/2023 Prepared by: Greig Erminio Nygard  Exercises - Sidelying Shoulder External Rotation  - 1 x daily - 3 x weekly - 3 sets - 10 reps - Sidelying Shoulder Flexion 15 Degrees  - 1 x daily - 3 x weekly - 2 sets - 10 reps - 3 sec hold - Seated Shoulder Extension and Scapular Retraction with Resistance  - 1 x daily - 3 x weekly - 2 sets - 10 reps - 3-5 sec hold - Seated Shoulder Row with Anchored Resistance  - 1 x daily - 3 x weekly - 2 sets - 10 reps - 3-5 hold hold - Shoulder Scar Massage  - 1-2 x daily - 7 x weekly - 1-2 min hold - Supine Shoulder Protraction with Dowel  - 1 x daily - 7 x weekly - 3 sets - 10 reps - Supine Shoulder Press with Dowel  - 1 x daily - 7 x weekly - 3 sets - 10 reps - Supine Shoulder External Rotation with Resistance  - 1 x daily - 7 x weekly - 3 sets - 10 reps Access Code: [email or web address] URL: [email or web address] Date: 11/17/2023 Prepared by: Elijah Hidden  Exercises - Seated Shoulder Flexion Towel Slide at Table Top  - 1 x daily - 7 x weekly - 3 sets - 10 reps - Standing Single Arm Shoulder Flexion Towel Slide at Table Top  - 1 x daily - 7 x weekly - 3 sets - 10 reps - Circular Shoulder Pendulum with Table Support  - 1 x daily - 7 x weekly - 3 sets - 10 reps - Isometric Shoulder Flexion at Wall  - 1 x daily - 3 x weekly - 2 sets - 5 reps - 5 sec hold - Isometric Shoulder Abduction at Wall  - 1 x daily - 3 x weekly - 2 sets - 5 reps - 5 sec hold - Isometric Shoulder Extension at Wall  - 1 x daily - 3 x weekly - 2 sets - 5 reps - 5 sec hold - Standing Isometric Shoulder Internal Rotation at Doorway  - 1 x daily - 3 x weekly - 2 sets - 5 reps - 5 sec hold - Standing Isometric Shoulder External Rotation with Doorway  - 1 x daily - 3 x weekly - 2 sets - 5  reps - 5 sec hold - Standing Scapular Retraction  - 1 x daily - 7 x weekly - 2 sets - 10 reps - 3-5 sec hold - Sidelying Shoulder External Rotation  - 1 x daily - 3 x  weekly - 3 sets - 10 reps - Sidelying Shoulder Flexion 15 Degrees  - 1 x daily - 3 x weekly - 2 sets - 10 reps - 3 sec hold - Seated Shoulder Extension and Scapular Retraction with Resistance  - 1 x daily - 3 x weekly - 2 sets - 10 reps - 3-5 sec hold - Seated Shoulder Row with Anchored Resistance  - 1 x daily - 3 x weekly - 2 sets - 10 reps - 3-5 hold hold - Shoulder Scar Massage  - 1-2 x daily - 7 x weekly - 1-2 min hold   ASSESSMENT:  CLINICAL IMPRESSION: Margean reports increased soreness, did obtain shoulder pulley for home, much more sore today so educated her regarding  reducing her effort with the shoulder pulleys as well as advised her to take some rest days, particularly advised her to take tomorrow as a rest day to allow for normal healing of the musculature after strengthening.  Amana Faith benefit from continued skilled PT to address ongoing ROM and strength deficits to improve functional UE use and activity tolerance with decreased pain interference.   EVAL: Takyla Kuchera is a 78 y/o F referred to PT for the evaluation and treatment of R shoulder pain s/p R Reverse TSA (08/18/23) and repair of the artificial joint via hemiarthroplasty (09/07/23). Pt reports that she has orders to limit R arm use to waist level only, avoid reaching overhead at this time. She is having difficulty with forward and overhead reaching for items, opening cans w/ R hand, washing hair, and driving. She states that she has learned ways to compensate using her left arm even though she is R hand dominant. Today's assessment was done with all items remaining within patient's tolerance. PROM revealed limitations in flexion, IR, ER, and abd as movement was limited by both pain and muscular guarding. Pt had difficulty fully relaxing the R arm for PROM today and displayed hesitancy to movement beyond waist side. Pt was educated on the importance of taking recovery slow at this point (as advised) and avoid heavy lifting and resistance  training after asking to try bicep curls with a small weight at home. Pt is aware that at this stage, it is important to focus of restoring as much ROM as she can and gradually progressing towards more strengthening activities with therapy. Pt scored a 79.5% on the QuickDASH representing severe disability of the UE in day to day activities.  Brianda Faith benefit from skilled PT intervention to address current deficits to improve functional ability, mobility, and activity tolerance w/ dec'd pain interference.   OBJECTIVE IMPAIRMENTS: decreased activity tolerance, decreased endurance, decreased knowledge of condition, decreased mobility, decreased ROM, decreased strength, increased fascial restrictions, impaired perceived functional ability, increased muscle spasms, impaired UE functional use, postural dysfunction, and pain.   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   REHAB  POTENTIAL: Good  CLINICAL DECISION MAKING: Evolving/moderate complexity  EVALUATION COMPLEXITY: Moderate  GOALS: Goals reviewed with patient? Yes  SHORT TERM GOALS: Target date: 12/11/2023    Patient Faith be independent with initial HEP.  Baseline: Goal status: MET - 11/16/23  2.  Patient Faith report at least 25% improvement in R shoulder pain to improve QoL. Baseline: Worst 5-7/10 Goal status: IN PROGRESS - 11/17/23 - pt noting shoulder more irritable over past week  3.  Patient Faith improve R shoulder flexion  PROM to 90 or better to aid in difficulty with forward reaching activities.  Baseline: limited by pain ~60 deg of flexion  Goal status: MET - 11/08/23 - no pain  4.  Patient Faith decrease her QuickDASH score by at least 15% to decrease severity of disability.  Baseline: 79.5% Goal status:  INITIAL  LONG TERM GOALS: Target date: 01/22/2024  Patient Faith be independent with advanced HEP.  Baseline:  Goal status: 11/30/23: progressing  2.  Patient Faith decrease her QuickDASH score to by at least 25-30% to improve function and activity tolerance d/t disability. Baseline: 79.5% Goal status: 11/30/23: QuickDASH Score: 45.5 / 100 = 45.5 %  3.  Patient Faith be able to perform overhead activities such as washing hair, retrieving items from cabinets to improve independent function at home. Baseline: pt is having difficulty with above activities  Goal status:11/30/23 progressing not met  4.  Patient Faith report at least 50% improvement in R shoulder pain to improve QoL.  Baseline: 5-7/10 Goal status: 11/30/23:  0 at rest, up to 3 with use , met so far  5.  Patient Faith obtain at least 3+/5 MMT of UE for available range to increase activity tolerance.   Baseline: Unable to assess Goal status: 11/30/23: progressing  PLAN: PT FREQUENCY: 2x/week  PT DURATION: 12 weeks  PLANNED INTERVENTIONS: 97164- PT Re-evaluation, 97750- Physical Performance Testing, 97110-Therapeutic exercises, 97530- Therapeutic activity, [id number]- Neuromuscular re-education, 97535- Self Care, 02859- Manual therapy, [id number]- Gait training, [phone]- Electrical stimulation (unattended), [id number]- Ultrasound, 02987- Traction (mechanical), 20560 (1-2 muscles), 20561 (3+ muscles)- Dry Needling, Patient/Family education, Taping, Joint mobilization, Cryotherapy, and Moist heat  PLAN FOR NEXT SESSION: Gentle R shoulder P/AA/AROM within pain free motion; ; review/revise HEP as indicated; may use Reverse Total Shoulder Arthroplasty for Fracture  Protocol for guidance (Sx 09/07/23) - post-op week #13 as of 12/07/23   Harleyquinn Gasser L Margretta Zamorano, PT, DPT, OCS 12/07/2023, 12:49 PM

## 2023-12-12 ENCOUNTER — Encounter: Payer: Self-pay | Admitting: Physical Therapy

## 2023-12-12 ENCOUNTER — Ambulatory Visit: Admitting: Physical Therapy

## 2023-12-12 DIAGNOSIS — M25611 Stiffness of right shoulder, not elsewhere classified: Secondary | ICD-10-CM

## 2023-12-12 DIAGNOSIS — M25511 Pain in right shoulder: Secondary | ICD-10-CM | POA: Diagnosis not present

## 2023-12-12 DIAGNOSIS — R252 Cramp and spasm: Secondary | ICD-10-CM

## 2023-12-12 DIAGNOSIS — M6281 Muscle weakness (generalized): Secondary | ICD-10-CM

## 2023-12-12 NOTE — Therapy (Signed)
 OUTPATIENT PHYSICAL THERAPY TREATMENT    Patient Name: Faith Massey MRN: [id number] DOB:[birth year], 78 y.o., female Today's Date: 12/12/2023  END OF SESSION:  PT End of Session - 12/12/23 1141     Visit Number 12    Date for Recertification  01/22/24    Authorization Type [employer]    Progress Note Due on Visit 20    PT Start Time 1145    PT Stop Time 1256    PT Time Calculation (min) 71 min    Activity Tolerance Patient limited by pain    Behavior During Therapy [facility] for tasks assessed/performed                    Past Medical History:  Diagnosis Date   Arthritis    Breast CA (HCC)    Chest mass 03/28/2022   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 10/16/2012   Pneumonia    Pre-diabetes    Prediabetes 05/25/2022   Pyelonephritis 11/10/2022   Right knee injury 02/14/2017   S/P radiation therapy 05/09/2023   S/P Robotic Assisted Right Video Thoracoscopy with resection of Thymus 04/25/2022   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   Past Surgical History:  Procedure Laterality Date   ABDOMINAL HYSTERECTOMY  03/22/2003   BACK SURGERY  03/21/1998   BREAST SURGERY     reconstruction   colon susrgery      CYSTOSCOPY WITH STENT PLACEMENT N/A 02/08/2023   Procedure: POSSIBLE CYSTOSCOPY WITH URETERAL STENT PLACEMENT;  Surgeon: Alvaro Ricardo KATHEE Mickey., MD;  Location: [facility];   Service: Urology;  Laterality: N/A;   LEFT HEART CATH AND CORONARY ANGIOGRAPHY N/A 07/20/2023   Procedure: LEFT HEART CATH AND CORONARY ANGIOGRAPHY;  Surgeon: Verlin Lonni BIRCH, MD;  Location: [facility];  Service: Cardiovascular;  Laterality: N/A;   MASTECTOMY Bilateral 03/21/1993   RESECTION OF A THYMOMA     04-25-22   REVISION TOTAL SHOULDER TO REVERSE TOTAL SHOULDER Right 09/07/2023   Procedure: REVISION, REVERSE TOTAL ARTHROPLASTY, SHOULDER;  Surgeon: Melita Drivers, MD;  Location: [facility];  Service: Orthopedics;  Laterality: Right;  HEMI ARTHROPLASTY, BONEGRAFT GLENOID   right total shoulder     Patient Active Problem List   Diagnosis Date Noted   S/P shoulder hemiarthroplasty, 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   Abnormal EKG 07/21/2023   Chest pain 07/20/2023   Preoperative cardiovascular examination 07/19/2023   Arthritis    Breast CA (HCC)    Hyperlipidemia    Laceration of muscle, fascia and tendon of long head of biceps, unspecified arm, initial encounter    Myasthenia gravis (HCC)    Pneumonia    S/P radiation therapy 05/09/2023   Hoarseness of voice 05/09/2023   Diverticular disease 02/08/2023   History of pulmonary embolism 12/22/2022   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   Pyelonephritis 11/10/2022   Prediabetes 05/25/2022  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   S/P Robotic Assisted Right Video Thoracoscopy with resection of Thymus 04/25/2022   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   Myasthenic syndrome (HCC) 04/12/2022   CVA (cerebral vascular accident) (HCC) 03/28/2022   HTN (hypertension) 03/28/2022   Chest mass 03/28/2022   Right knee injury 02/14/2017   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 10/16/2012   Osteoporosis 10/12/2012    PCP: Watt Harlene BROCKS, MD   REFERRING PROVIDER: Melita Drivers, MD   REFERRING DIAG: [phone] (ICD-10-CM) - Presence of right artificial shoulder joint   THERAPY DIAG:  Acute pain of right shoulder  Stiffness of right shoulder, not  elsewhere classified  Muscle weakness (generalized)  Cramp and spasm  RATIONALE FOR EVALUATION AND TREATMENT: Rehabilitation  ONSET DATE: Post-Op R TSA revision/conversion to hemi-arthroplasty on 09/07/2023   NEXT MD VISIT:  ~01/10/24 (~6 wks from 11/29/23)   SUBJECTIVE:                                                                                                                                                                                      SUBJECTIVE STATEMENT:  Pt reports she may have overdone things with her pulleys at home and her shoulder is more irritable recently.   Hand dominance: Right  PERTINENT HISTORY: MG, h/o radiation (March and May 2024) for mediastinal mass, Breast CA, HTN, OP, R TSA (09/07/23)  PAIN:  Are you having pain? Yes: NPRS scale: 3-4/10 Pain location: R anterior upper shoulder, scapular region upon waking in the morning  Pain description: mostly aching  Aggravating factors: pulleys at home  Relieving factors: Ice, medication   PRECAUTIONS:  Shoulder: physical therapist referral - Evaluate and treat per protocol status post revision right reverse total shoulder arthroplasty to CTA hemiarthroplasty, DOS 09/07/23 Patient unfortunately had a fracture at the base of the glenosphere making it unstable and requiring removal and conversion to hemiarthroplasty. Please adjust her physical therapy to be done under a very slow and steady and gradual advance of range of motion and gradual use of the right upper extremity within her pain tolerance. Please call me for any additional questions  RED FLAGS: None   WEIGHT BEARING RESTRICTIONS: No  FALLS:  Has patient fallen in last 6 months? Yes. Number of falls a week and a half ago, feel onto buttock while pulling weeds out of yard   LIVING ENVIRONMENT: Lives with: lives alone Lives in: House/apartment Stairs: No Has following equipment at home: Single point cane, shower chair, Grab bars, and walking stick    OCCUPATION: Retired   PLOF: Independent with household mobility with device and Leisure: watching tv, reading, walking her dog,  social gatherings w/ friends to play card games   PATIENT GOALS: Get my shoulder to functioning, lifting shoulder, bearing weight   OBJECTIVE:  Note: Objective measures were completed at Evaluation unless otherwise noted.  DIAGNOSTIC FINDINGS:  09/20/2022: R shoulder XR  Results: Degenerative changes of the before meals and glenohumeral joints. No other acute findings.   PATIENT SURVEYS :  QuickDASH Score: 79.5 / 100 = 79.5 %  COGNITION: Overall cognitive status: Within functional limits for tasks assessed     SENSATION: WFL  POSTURE: Rounded shoulders, fwd head   UPPER EXTREMITY ROM:  LUE: Grossly WFL   Passive ROM Right eval R 11/02/23 R 11/08/23 R 11/27/23  R 11/30/23   Shoulder flexion Limited, p! Less than 90 (around 60) 84 90 120 120  Shoulder extension       Shoulder abduction Limited less than 90 74 scaption 80 scaption 100 100  Shoulder adduction       Shoulder internal rotation Limited p! 35 at 30 scaption     Shoulder external rotation Limitied p! 45 at 30 scaption 50  64 73  (Blank rows = not tested)  Active ROM R 11/30/23  Shoulder flexion 56  Shoulder extension   Shoulder abduction   Shoulder adduction   Shoulder extension   Shoulder internal rotation   Shoulder external rotation    (Blank rows = not tested)   UPPER EXTREMITY MMT:  MMT Right eval Left eval R 11/30/23  Shoulder flexion  4- 2  Shoulder extension     Shoulder abduction  4+   Shoulder adduction     Shoulder internal rotation  4-   Shoulder external rotation  4- 3-  Middle trapezius     Lower trapezius     Elbow flexion  5   Elbow extension  4+   Wrist flexion     Wrist extension     Wrist ulnar deviation     Wrist radial deviation     Wrist pronation     Wrist supination     Grip strength (lbs)     (Blank rows = not tested)  11/30/23:  R  shoulder flexion active to 56 degrees R shoulder ER 3-/5  SHOULDER SPECIAL TESTS: Not Performed   JOINT MOBILITY TESTING:  Not Performed   PALPATION:  TTP: R ant glenoid, collar bone area (tender, not painful)                                                                                                                              TREATMENT DATE:   12/12/2023  THERAPEUTIC EXERCISE: To improve ROM and flexibility.  Demonstration, verbal and tactile cues throughout for technique.  Pulleys: Flexion & Scaption x 3 min each - reviewed submax effort at ~25% effort with eccentric lowering  Supine R shoulder flexion and scaption P/AAROM with PT inhibiting UT/shoulder shrug   MANUAL THERAPY: To promote normalized muscle tension, improved flexibility, improved joint mobility,  increased ROM, and pain modulation utilizing joint mobilization, connective tissue massage, therapeutic massage, and manual TP therapy.  Supine gentle R shoulder oscillation and grade I-II inferior glide for pain relief/muscle relxation Supine with towel roll supporting R post humerus, for gentle ROM R shoulder, with intermittent retrograde massage, STM/DTM and manual TPR to R UT, LS, pecs, subscapularis, teres group and infrapinatus  L S/L R scapular mobilization  MODALITIES:  TENS unit to R shoulder complex - intensity to pt tolerance x 20 min + moist heat pack for pain relief and decreased muscle guarding  SELF CARE: Provided education on pain management options.  Provided education on home TENS unit options including proper set up, precautions and recommended home frequency for use.   12/07/23:  Shoulder pulley flex, scaption, 2 min each.  The patient has been eccentrically lowering her R shoulder with the pulleys, advised her to reduce her engagement of musculature with eccentric lowering to 25% vs a maximal contraction that she has been utilizing. Supine with towel roll supporting R post humerus, for gentle ROM R  shoulder, with intermittent retrograde massage.  Assisted R scapular/serratus punches 10x Supine chest press with yard stick 10x Supine manually resisted isometrics for R shoulder ER 10 reps, 5 sec holds Side lying L for R shoulder isometric adduction, towel roll in axilla,  5 sec holds, 10 reps  Side lying L for R shoulder ER 10 reps , pt reported soreness so stopped Side lying for assisted R shoulder flexion 10 reps, therapist supported under R hand    11/30/23:  Reassessed for 10th visit progress report Progressed therex according to pts tolerance and to protocol, now 12 weeks post op Shoulder pulley x 3 min flexion and scaption, slow Supine for serratus punches, initially with therapist supporting R arm and then with yard stick Supine for chest presses with yard stick Supine L shoulder ER with yellow theraband, stabilizing, static position R shoulder Side lying L for R shoulder ER Side lying L for R shoulder flexion Updated HEP below   11/27/2023 THERAPEUTIC EXERCISE: To improve strength, endurance, ROM, and flexibility.  Demonstration, verbal and tactile cues throughout for technique.  Pulleys: Flexion & Scaption x 3 min each  Standing R shoulder flexion/scaption wall slides x 5 - AAROM required with L hand at elbow- limited today Standing R shoulder flexion counter slides with 2# weight on washcloth 2 x 10 Standing R shoulder scaption counter slides with 2# weight on washcloth 2 x 10 Seated hand clasped AAROM shoulder flexion x 5  NEUROMUSCULAR RE-EDUCATION: To improve coordination, kinesthesia, and posture. R shoulder flexion YTB reactive isometric step-outs x 10 (neutral shoulder with elbow flexed to 90) R shoulder extension YTB reactive isometric step-outs x 10 (neutral shoulder with elbow flexed to 90) - cues for scapular retraction activation to stabilize shoulder R shoulder IR YTB reactive isometric step-outs x 10 (neutral shoulder with elbow flexed to 90) R shoulder ER  YTB reactive isometric step-outs x 10 (neutral shoulder with elbow flexed to 90) - cues for scapular retraction activation to stabilize shoulder   11/23/2023 THERAPEUTIC EXERCISE: To improve strength, endurance, ROM, and flexibility.  Demonstration, verbal and tactile cues throughout for technique.  Pulleys: Flexion & Scaption x 3 min each  Standing R shoulder flexion/scaption wall slides x 10 - AAROM required with L hand at elbow Standing R shoulder flexion counter slides with 2# weight on washcloth 2 x 10 Standing R shoulder scaption counter slides with 2# weight on washcloth 2 x 10  Seated B shoulder orange Pball flexion & scaption walk-out x 10 each (attempted initially on wall but deferred d/t excessive R shoulder shrugging  NEUROMUSCULAR RE-EDUCATION: To improve coordination, kinesthesia, and posture. R shoulder flexion YTB reactive isometric step-outs x 5 (neutral shoulder with elbow flexed to 90) R shoulder extension YTB reactive isometric step-outs x 5 (neutral shoulder with elbow flexed to 90) - cues for scapular retraction activation to stabilize shoulder R shoulder IR YTB reactive isometric step-outs x 5 (neutral shoulder with elbow flexed to 90) R shoulder ER YTB reactive isometric step-outs x 5 (neutral shoulder with elbow flexed to 90) - cues for scapular retraction activation to stabilize shoulder   11/21/2023  THERAPEUTIC EXERCISE: To improve strength, endurance, ROM, and flexibility.  Demonstration, verbal and tactile cues throughout for technique. Pulleys: Flexion & Scaption x 3 min each  Supine for gentle R shoulder PROM/stretching all planes Supine flexed elbow R shoulder flexion/scaption (uppercut) AA/AROM 2 x 10 - somewhat better tolerance for AROM but still slight guiding from PT initially with motion  MANUAL THERAPY: To promote normalized muscle tension, improved flexibility, improved joint mobility, and reduced pain utilizing connective tissue massage, therapeutic  massage, manual TP therapy, and scar mobilization.  STM/XFM to R shoulder surgical incision Supine gentle R shoulder oscillation and grade I-II inferior glide for pain relief/muscle relxation  NEUROMUSCULAR RE-EDUCATION: To improve coordination, kinesthesia, and posture. S/L R shoulder ER maintaining mini-pillow under axilla and R elbow at 90 degrees 2 x 10 - VC & TC for scapular engagement, avoiding shoulder shrug S/L R shoulder flexion - initially attempted with elbow straight but transitioned to R elbow at 90 degrees x 10  SELF CARE:  Provided education on sleeping position and use of pillow(s) to support R arm.    11/17/2023  THERAPEUTIC EXERCISE: To improve ROM and flexibility.  Demonstration, verbal and tactile cues throughout for technique.  Pulleys: Flexion & Scaption x 3 min each  Supine for gentle R shoulder PROM/stretching all planes Supine flexed elbow R shoulder flexion/scaption (uppercut) AA/AROM 2 x 10 - AAROM d/t increased discomfort with AROM  MANUAL THERAPY: To promote normalized muscle tension, improved flexibility, improved joint mobility, and reduced pain utilizing connective tissue massage, therapeutic massage, manual TP therapy, and scar mobilization.  STM/XFM to R shoulder surgical incision STM/DTM and manual TPR to R pecs  NEUROMUSCULAR RE-EDUCATION: To improve coordination, kinesthesia, and posture. S/L R shoulder ER maintaining towel roll under axilla and R elbow at 90 degrees 2 x 10 - VC & TC for scapular engagement S/L R shoulder flexion - initially attempted with elbow straight but transitioned to R elbow at 90 degrees x 10 Seated YTB scap retraction + B shoulder row x 10 Seated YTB scap retraction + B shoulder extension stopping shy of neutral x 10   11/14/23: Pulleys 3 min flexion B Supine for gentle stretching R shoulder all planes Supine with R humerus supported in line with trunk for manually resisted R shoulder isometric inv/ev mass practice Side  lying L for R shoulder ER maintaining towel roll under axilla and R elbow at 90 degrees Standing with R elbow supported on counter, pulling towel with 6# on towel from flexed position to neutral, then advance to sitting in chair for yellow t band rows, needed frequent cues to avoid R shoulder extension past trunk, had pt slide back in chair so that the seat back blocked her from extending R shoulder    11/08/2023  THERAPEUTIC EXERCISE: To improve ROM and flexibility.  Demonstration, verbal and tactile cues throughout for technique.  Pulleys: Flexion x 3 min, Scaption x 3 min (scaption better tolerated today) Standing counter wash AAROM LUE: Flexion x 20 Scaption x 20 R shoulder PROM within pain tolerance for flexion, scaption, IR and ER Measured PROM Shoulder isometrics: flex, abd, ER, ext 5x5 Standing YTB scap retraction + B shoulder row- clicking clicking at first but after readjusting no clicking  Standing YTB scap retraction + B shoulder extension stopping shy of neutral x 10   11/06/2023  THERAPEUTIC EXERCISE: To improve ROM and flexibility.  Demonstration, verbal and tactile cues throughout for technique.  Pulleys: Flexion x 3 min, Scaption x 3 min (scaption better tolerated today) Seated R shoulder AAROM with Swiffer: Flexion 2 x 10 Scaption 2 x 10 R shoulder PROM by PT within pain tolerance for flexion, scaption, IR and ER  Hooklying R shoulder AAROM with cane: Flexion 2 x 10 Scaption x 10 - pt needing PT guidance/assistance for proper movement pattern  NEUROMUSCULAR RE-EDUCATION: To improve coordination, kinesthesia, posture, and proprioception. R shoulder rhythmic stabilization at 90 flexion (1-finger perturbations) 2 x 15-20 sec R shoulder protraction AAROM with wand 2 x 10 Standing YTB scap retraction + B shoulder row x 10 Standing YTB scap retraction + B shoulder extension stopping shy of neutral x 10   11/02/2023  THERAPEUTIC EXERCISE: To improve strength,  endurance, ROM, and flexibility.  Demonstration, verbal and tactile cues throughout for technique.  Pulleys: Flexion x 3 min, Scaption x 1.5 min (discontinued d/t increasing discomfort with scaption) Table slides standing at counter top:  Flexion 2 x 10 Scaption 2 x 10 Seated R shoulder AAROM with Swiffer: Flexion 2 x 10 Scaption 2 x 10 R shoulder PROM by PT within pain tolerance for flexion, scaption, IR and ER (ER limited to ~45 per protocol) Standing R shoulder submax (~25% effort) isometrics into wall 5 x 5 - flexion, abduction, extension, IR & ER Standing scapular retraction 10 x 5  SELF CARE:  Yellow Theraputty provided for home grip strengthening   10/30/2023 SELF CARE:  Reviewed eval findings and role of PT in addressing identified deficits as well as instruction in initial HEP (see below).    PATIENT EDUCATION:  Education details: home TENS unit options and home TENS unit set up, including recommended electrode placement and parameters adjustment  Person educated: Patient Education method: Explanation, Demonstration, Verbal cues, and Handouts Education comprehension: verbalized understanding and needs further education  HOME EXERCISE PROGRAM: Access Code: [email or web address] URL: [email or web address] Date: 11/30/2023 Prepared by: Greig Speaks  Exercises - Sidelying Shoulder External Rotation  - 1 x daily - 3 x weekly - 3 sets - 10 reps - Sidelying Shoulder Flexion 15 Degrees  - 1 x daily - 3 x weekly - 2 sets - 10 reps - 3 sec hold - Seated Shoulder Extension and Scapular Retraction with Resistance  - 1 x daily - 3 x weekly - 2 sets - 10 reps - 3-5 sec hold - Seated Shoulder Row with Anchored Resistance  - 1 x daily - 3 x weekly - 2 sets - 10 reps - 3-5 hold hold - Shoulder Scar Massage  - 1-2 x daily - 7 x weekly - 1-2 min hold - Supine Shoulder Protraction with Dowel  - 1 x daily - 7 x weekly - 3 sets - 10 reps - Supine Shoulder Press with Dowel  - 1 x daily -  7 x weekly - 3 sets - 10 reps - Supine  Shoulder External Rotation with Resistance  - 1 x daily - 7 x weekly - 3 sets - 10 reps Access Code: [email or web address] URL: [email or web address] Date: 11/17/2023 Prepared by: Elijah Hidden  Exercises - Seated Shoulder Flexion Towel Slide at Table Top  - 1 x daily - 7 x weekly - 3 sets - 10 reps - Standing Single Arm Shoulder Flexion Towel Slide at Table Top  - 1 x daily - 7 x weekly - 3 sets - 10 reps - Circular Shoulder Pendulum with Table Support  - 1 x daily - 7 x weekly - 3 sets - 10 reps - Isometric Shoulder Flexion at Wall  - 1 x daily - 3 x weekly - 2 sets - 5 reps - 5 sec hold - Isometric Shoulder Abduction at Wall  - 1 x daily - 3 x weekly - 2 sets - 5 reps - 5 sec hold - Isometric Shoulder Extension at Wall  - 1 x daily - 3 x weekly - 2 sets - 5 reps - 5 sec hold - Standing Isometric Shoulder Internal Rotation at Doorway  - 1 x daily - 3 x weekly - 2 sets - 5 reps - 5 sec hold - Standing Isometric Shoulder External Rotation with Doorway  - 1 x daily - 3 x weekly - 2 sets - 5 reps - 5 sec hold - Standing Scapular Retraction  - 1 x daily - 7 x weekly - 2 sets - 10 reps - 3-5 sec hold - Sidelying Shoulder External Rotation  - 1 x daily - 3 x weekly - 3 sets - 10 reps - Sidelying Shoulder Flexion 15 Degrees  - 1 x daily - 3 x weekly - 2 sets - 10 reps - 3 sec hold - Seated Shoulder Extension and Scapular Retraction with Resistance  - 1 x daily - 3 x weekly - 2 sets - 10 reps - 3-5 sec hold - Seated Shoulder Row with Anchored Resistance  - 1 x daily - 3 x weekly - 2 sets - 10 reps - 3-5 hold hold - Shoulder Scar Massage  - 1-2 x daily - 7 x weekly - 1-2 min hold   ASSESSMENT:  CLINICAL IMPRESSION: Anjannette reports increased R shoulder soreness, potentially from overuse with home pulley set-up.  Significant guarding and accessory movement from UT noted with attempts at R shoulder P/AAROM with several TrPs identified t/o R shoulder complex,  therefore majority of session focused on reducing muscle guarding and promoting muscle relaxation for pain relief.  Encouraged her to rest with her HEP until her next PT visit this week at which time the PT will reintroduce exercises based on pain tolerance and proper muscle activation.  Session conclude with trial of TENS to further reduce pain and muscle guarding with information provided on home TENS unit.  Meiah will benefit from continued skilled PT to address ongoing ROM and strength deficits to improve functional UE use and activity tolerance with decreased pain interference.   EVAL: Uniqua Kihn is a 78 y/o F referred to PT for the evaluation and treatment of R shoulder pain s/p R Reverse TSA (08/18/23) and repair of the artificial joint via hemiarthroplasty (09/07/23). Pt reports that she has orders to limit R arm use to waist level only, avoid reaching overhead at this time. She is having difficulty with forward and overhead reaching for items, opening cans w/ R hand, washing hair, and driving. She states that she has learned ways to compensate using her left  arm even though she is R hand dominant. Today's assessment was done with all items remaining within patient's tolerance. PROM revealed limitations in flexion, IR, ER, and abd as movement was limited by both pain and muscular guarding. Pt had difficulty fully relaxing the R arm for PROM today and displayed hesitancy to movement beyond waist side. Pt was educated on the importance of taking recovery slow at this point (as advised) and avoid heavy lifting and resistance training after asking to try bicep curls with a small weight at home. Pt is aware that at this stage, it is important to focus of restoring as much ROM as she can and gradually progressing towards more strengthening activities with therapy. Pt scored a 79.5% on the QuickDASH representing severe disability of the UE in day to day activities.  Henriette will benefit from skilled PT intervention to  address current deficits to improve functional ability, mobility, and activity tolerance w/ dec'd pain interference.   OBJECTIVE IMPAIRMENTS: decreased activity tolerance, decreased endurance, decreased knowledge of condition, decreased mobility, decreased ROM, decreased strength, increased fascial restrictions, impaired perceived functional ability, increased muscle spasms, impaired UE functional use, postural dysfunction, and pain.   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   REHAB POTENTIAL: Good  CLINICAL DECISION MAKING: Evolving/moderate complexity  EVALUATION COMPLEXITY: Moderate  GOALS: Goals reviewed with patient? Yes  SHORT TERM GOALS: Target date: 12/11/2023    Patient will be independent with initial HEP.  Baseline: Goal status: MET - 11/16/23  2.  Patient will report at least 25% improvement in R shoulder pain to improve QoL. Baseline: Worst 5-7/10 Goal status: IN PROGRESS - 11/17/23 - pt noting shoulder more irritable over past week  3.  Patient will improve R shoulder flexion PROM to 90 or better to aid in difficulty with forward reaching activities.  Baseline: limited by pain ~60 deg of flexion  Goal status: MET - 11/08/23 - no pain  4.  Patient will decrease her QuickDASH score by at least 15% to decrease severity of disability.  Baseline: 79.5% Goal status:  MET - 11/30/23 - QuickDASH Score: 45.5 / 100 = 45.5 %  LONG TERM GOALS: Target date:  01/22/2024  Patient will be independent with advanced HEP.  Baseline:  Goal status: IN PROGRESS - 11/30/23  2.  Patient will decrease her QuickDASH score to by at least 25-30% to improve function and activity tolerance d/t disability. Baseline: 79.5% Goal status: IN PROGRESS - 11/30/23: QuickDASH Score: 45.5 / 100 = 45.5 %  3.  Patient will be able to perform overhead activities such as washing hair, retrieving items from cabinets to improve independent function at home. Baseline: pt is having difficulty with above activities  Goal status: IN PROGRESS - 11/30/23: progressing not met  4.  Patient will report at least 50% improvement in R shoulder pain to improve QoL.  Baseline: 5-7/10 11/30/23:  0 at rest, up to 3 with use , met so far Goal status: IN PROGRESS - 12/12/23 -  R shoulder more irritable (3-4/10)  5.  Patient will obtain at least 3+/5 MMT of UE for available range to  increase activity tolerance.   Baseline: Unable to assess Goal status: IN PROGRESS - 11/30/23: progressing  PLAN: PT FREQUENCY: 2x/week  PT DURATION: 12 weeks  PLANNED INTERVENTIONS: 97164- PT Re-evaluation, 97750- Physical Performance Testing, 97110-Therapeutic exercises, 97530- Therapeutic activity, [id number]- Neuromuscular re-education, 97535- Self Care, 02859- Manual therapy, [phone]- Gait training, [phone]- Electrical stimulation (unattended), 97035- Ultrasound, 02987- Traction (mechanical), 20560 (1-2 muscles), 20561 (3+ muscles)- Dry Needling, Patient/Family education, Taping, Joint mobilization, Cryotherapy, and Moist heat  PLAN FOR NEXT SESSION: Assess response to TENS; Gentle R shoulder P/AA/AROM within pain free motion; review/revise HEP as indicated; may use Reverse Total Shoulder Arthroplasty for Fracture Protocol for guidance (Sx 09/07/23) - post-op week #14 as of 12/14/23   Elijah CHRISTELLA Hidden, PT 12/12/2023, 1:25 PM

## 2023-12-13 ENCOUNTER — Inpatient Hospital Stay: Admitting: Hematology & Oncology

## 2023-12-13 ENCOUNTER — Encounter: Payer: Self-pay | Admitting: Hematology & Oncology

## 2023-12-13 ENCOUNTER — Inpatient Hospital Stay: Attending: Hematology & Oncology

## 2023-12-13 VITALS — BP 131/66 | HR 86 | Temp 97.6°F | Resp 20 | Ht <= 58 in | Wt 113.0 lb

## 2023-12-13 DIAGNOSIS — Z7901 Long term (current) use of anticoagulants: Secondary | ICD-10-CM | POA: Insufficient documentation

## 2023-12-13 DIAGNOSIS — Z170 Estrogen receptor positive status [ER+]: Secondary | ICD-10-CM

## 2023-12-13 DIAGNOSIS — Z86000 Personal history of in-situ neoplasm of breast: Secondary | ICD-10-CM | POA: Insufficient documentation

## 2023-12-13 DIAGNOSIS — I2699 Other pulmonary embolism without acute cor pulmonale: Secondary | ICD-10-CM | POA: Diagnosis not present

## 2023-12-13 DIAGNOSIS — D4989 Neoplasm of unspecified behavior of other specified sites: Secondary | ICD-10-CM

## 2023-12-13 DIAGNOSIS — C37 Malignant neoplasm of thymus: Secondary | ICD-10-CM

## 2023-12-13 DIAGNOSIS — M810 Age-related osteoporosis without current pathological fracture: Secondary | ICD-10-CM

## 2023-12-13 DIAGNOSIS — G709 Myoneural disorder, unspecified: Secondary | ICD-10-CM

## 2023-12-13 DIAGNOSIS — Z9013 Acquired absence of bilateral breasts and nipples: Secondary | ICD-10-CM | POA: Diagnosis not present

## 2023-12-13 LAB — CBC WITH DIFFERENTIAL (CANCER CENTER ONLY)
Abs Immature Granulocytes: 0.04 K/uL (ref 0.00–0.07)
Basophils Absolute: 0 K/uL (ref 0.0–0.1)
Basophils Relative: 0 %
Eosinophils Absolute: 0.1 K/uL (ref 0.0–0.5)
Eosinophils Relative: 1 %
HCT: 39.7 % (ref 36.0–46.0)
Hemoglobin: 12.8 g/dL (ref 12.0–15.0)
Immature Granulocytes: 0 %
Lymphocytes Relative: 19 %
Lymphs Abs: 2 K/uL (ref 0.7–4.0)
MCH: 31 pg (ref 26.0–34.0)
MCHC: 32.2 g/dL (ref 30.0–36.0)
MCV: 96.1 fL (ref 80.0–100.0)
Monocytes Absolute: 1 K/uL (ref 0.1–1.0)
Monocytes Relative: 10 %
Neutro Abs: 7 K/uL (ref 1.7–7.7)
Neutrophils Relative %: 70 %
Platelet Count: 280 K/uL (ref 150–400)
RBC: 4.13 MIL/uL (ref 3.87–5.11)
RDW: 15.9 % — ABNORMAL HIGH (ref 11.5–15.5)
WBC Count: 10.1 K/uL (ref 4.0–10.5)
nRBC: 0 % (ref 0.0–0.2)

## 2023-12-13 LAB — CMP (CANCER CENTER ONLY)
ALT: 22 U/L (ref 0–44)
AST: 22 U/L (ref 15–41)
Albumin: 4.4 g/dL (ref 3.5–5.0)
Alkaline Phosphatase: 83 U/L (ref 38–126)
Anion gap: 11 (ref 5–15)
BUN: 26 mg/dL — ABNORMAL HIGH (ref 8–23)
CO2: 24 mmol/L (ref 22–32)
Calcium: 9.5 mg/dL (ref 8.9–10.3)
Chloride: 106 mmol/L (ref 98–111)
Creatinine: 0.82 mg/dL (ref 0.44–1.00)
GFR, Estimated: >60 mL/min (ref >60)
Glucose, Bld: 91 mg/dL (ref 70–99)
Potassium: 4.7 mmol/L (ref 3.5–5.1)
Sodium: 141 mmol/L (ref 135–145)
Total Bilirubin: 0.4 mg/dL (ref 0.0–1.2)
Total Protein: 7.3 g/dL (ref 6.5–8.1)

## 2023-12-13 LAB — MAGNESIUM: Magnesium: 2.1 mg/dL (ref 1.7–2.4)

## 2023-12-13 NOTE — Progress Notes (Signed)
 Hematology and Oncology Follow Up Visit  Faith Massey [id number] 06/30/1945 78 y.o. 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 10/16/2012   Pneumonia    Pre-diabetes    Prediabetes 05/25/2022   Pyelonephritis 11/10/2022   Right knee injury 02/14/2017   S/P radiation therapy 05/09/2023   S/P Robotic Assisted Right Video Thoracoscopy with resection of Thymus 04/25/2022   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  She is doing okay right now.  Unfortunately, she had problems with her shoulder surgery.  She needed to have a second shoulder surgery to help fix it.  She has limited range of motion of her right shoulder right now.  I think she is doing some physical therapy to try to improve the shoulder functioning.  She did have a CAT scan that was done.  This was done on 11/23/2023.  The CT scan did not show any evidence of recurrent thymoma.  She continues on Eliquis .  She is doing well on the Eliquis .  She has had no  chest wall pain.  She has had no leg pain.  She has had no change in bowel or bladder habits.  She has had no rashes.  She has had no swollen lymph nodes..  Overall, I will have to say that her performance status is probably ECOG 0.     Wt Readings from Last 3 Encounters:  12/13/23 113 lb (51.3 kg)  11/09/23 115 lb 8 oz (52.4 kg)  10/09/23 107 lb (48.5 kg)     Medications:   Current Outpatient Medications:    acetaminophen  (TYLENOL ) 650 MG CR tablet, Take 1,300 mg by mouth every 8 (eight) hours as needed for pain., Disp: , Rfl:    BLACK ELDERBERRY PO, Take 1 capsule by mouth daily as needed (immune support)., Disp: , Rfl:    COLLAGEN PO, Take 1 Scoop by mouth daily., Disp: , Rfl:    Desiccated Beef Liver POWD, 4 capsules by Does not apply route daily., Disp: , Rfl:    ELIQUIS  2.5 MG TABS tablet, TAKE ONE TABLET BY MOUTH TWICE A DAY, Disp: 180 tablet, Rfl: 2   lisinopril  (ZESTRIL ) 20 MG tablet, Take 1.5 tablets (30 mg total) by mouth daily. Take one tablet and a half, total of 30 mg daily., Disp: 135 tablet, Rfl: 1   Melatonin 10 MG CAPS, Take 10 mg by mouth at bedtime., Disp: , Rfl:    Multiple Vitamins-Minerals (MULTIVITAMIN WITH MINERALS)  tablet, Take 1 tablet by mouth daily., Disp: , Rfl:    OCTAGAM 20 GM/200ML SOLN, Inject 50 g into the vein. 03/29/2023 Takes 50 grams for 2 days every 3 weeks., Disp: , Rfl:    OCTAGAM 5 GM/50ML SOLN, , Disp: , Rfl:    OVER THE COUNTER MEDICATION, Take 1 tablet by mouth in the morning and at bedtime. Candidase, Disp: , Rfl:    Probiotic Product (PROBIOTIC PO), Take 1-2 capsules by mouth daily., Disp: , Rfl:    pyridostigmine  (MESTINON ) 60 MG tablet, Take 1 tablet (60 mg total) by mouth 4 (four) times daily as needed., Disp: 90 tablet, Rfl: 6   Spirulina POWD, 4 capsules by Does not apply route daily., Disp: , Rfl:    UNABLE TO FIND, Take 0.25 tablets by mouth at bedtime. Med Name: A quarter of a CBD gummy daily at bedtime., Disp: , Rfl:     Cholecalciferol (VITAMIN D -3) 5000 UNITS TABS, Take 5,000 Units by mouth daily. (Patient not taking: Reported on 11/09/2023), Disp: , Rfl:    traMADol  (ULTRAM ) 50 MG tablet, Take 1 tablet (50 mg total) by mouth every 6 (six) hours as needed. (Patient not taking: Reported on 12/13/2023), Disp: 20 tablet, Rfl: 0  Allergies:  Allergies  Allergen Reactions   Prednisone  Shortness Of Breath, Swelling, Palpitations, Dermatitis and Hypertension     Reports medication caused swelling and metallic taste in mouth.    Past Medical History, Surgical history, Social history, and Family History were reviewed and updated.  Review of Systems: Review of Systems  Constitutional: Negative.   HENT:   Positive for voice change.   Eyes: Negative.   Respiratory: Negative.    Cardiovascular: Negative.   Gastrointestinal: Negative.   Endocrine: Negative.   Genitourinary: Negative.    Musculoskeletal: Negative.   Skin: Negative.   Neurological: Negative.   Hematological: Negative.   Psychiatric/Behavioral: Negative.      Physical Exam:  height is 4' 10 (1.473 m) and weight is 113 lb (51.3 kg). Her oral temperature is 97.6 F (36.4 C). Her blood pressure is 131/66 and her pulse is 86. Her respiration is 20 and oxygen saturation is 100%.   Physical Exam Vitals reviewed.  HENT:     Head: Normocephalic and atraumatic.  Eyes:     Pupils: Pupils are equal, round, and reactive to light.  Cardiovascular:     Rate and Rhythm: Normal rate and regular rhythm.     Heart sounds: Normal heart sounds.  Pulmonary:     Effort: Pulmonary effort is normal.     Breath sounds: Normal breath sounds.  Abdominal:     General: Bowel sounds are normal.     Palpations: Abdomen is soft.  Musculoskeletal:        General: No tenderness or deformity. Normal range of motion.     Cervical back: Normal range of motion.     Comments: She has limited range of motion of the right arm.  Lymphadenopathy:     Cervical: No cervical  adenopathy.  Skin:    General: Skin is warm and dry.     Findings: No erythema or rash.  Neurological:     Mental Status: She is alert and oriented to person, place, and time.  Psychiatric:        Behavior: Behavior normal.        Thought Content: Thought content normal.        Judgment: Judgment normal.     Lab Results  Component  Value Date   WBC 10.1 12/13/2023   HGB 12.8 12/13/2023   HCT 39.7 12/13/2023   MCV 96.1 12/13/2023   PLT 280 12/13/2023     Chemistry      Component Value Date/Time   NA 141 12/13/2023 1158   NA 142 09/12/2022 1204   K 4.7 12/13/2023 1158   CL 106 12/13/2023 1158   CO2 24 12/13/2023 1158   BUN 26 (H) 12/13/2023 1158   BUN 17 09/12/2022 1204   CREATININE 0.82 12/13/2023 1158   CREATININE 0.61 05/06/2013 1003      Component Value Date/Time   CALCIUM  9.5 12/13/2023 1158   ALKPHOS 83 12/13/2023 1158   AST 22 12/13/2023 1158   ALT 22 12/13/2023 1158   BILITOT 0.4 12/13/2023 1158      Assessment and Plan-   Ms. Tedder is a very charming 79 year old white female.  She is followed up for her thymoma and for the pulmonary embolism.  Everything looks fantastic from my point of view.  At this point time, we will get her through the Holiday season.  I will plan to have her come back probably in February.  We would like to do another CT scan on her.  I get 1 done about 2 or 3 weeks before I see her back.  I think this would be realistic.  I am happy that her shoulder is doing better.  I know it will require quite a bit of physical therapy for her to have better range of motion.

## 2023-12-14 ENCOUNTER — Encounter: Payer: Self-pay | Admitting: Physical Therapy

## 2023-12-14 ENCOUNTER — Ambulatory Visit: Admitting: Physical Therapy

## 2023-12-14 DIAGNOSIS — G7001 Myasthenia gravis with (acute) exacerbation: Secondary | ICD-10-CM | POA: Diagnosis not present

## 2023-12-14 DIAGNOSIS — M25511 Pain in right shoulder: Secondary | ICD-10-CM

## 2023-12-14 DIAGNOSIS — Z7689 Persons encountering health services in other specified circumstances: Secondary | ICD-10-CM | POA: Diagnosis not present

## 2023-12-14 DIAGNOSIS — M6281 Muscle weakness (generalized): Secondary | ICD-10-CM

## 2023-12-14 DIAGNOSIS — G709 Myoneural disorder, unspecified: Secondary | ICD-10-CM | POA: Diagnosis not present

## 2023-12-14 DIAGNOSIS — R252 Cramp and spasm: Secondary | ICD-10-CM

## 2023-12-14 DIAGNOSIS — M25611 Stiffness of right shoulder, not elsewhere classified: Secondary | ICD-10-CM

## 2023-12-14 NOTE — Therapy (Signed)
 OUTPATIENT PHYSICAL THERAPY TREATMENT    Patient Name: Faith Massey MRN: [id number] DOB:January 29, 1946, 78 y.o., female Today's Date: 12/14/2023  END OF SESSION:  PT End of Session - 12/14/23 1145     Visit Number 13    Date for Recertification  01/22/24    Authorization Type [employer]    Progress Note Due on Visit 20    PT Start Time 1145    PT Stop Time 1243    PT Time Calculation (min) 58 min    Activity Tolerance Patient limited by pain    Behavior During Therapy [facility] for tasks assessed/performed                     Past Medical History:  Diagnosis Date   Arthritis    Breast CA (HCC)    Chest mass 03/28/2022   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 10/16/2012   Pneumonia    Pre-diabetes    Prediabetes 05/25/2022   Pyelonephritis 11/10/2022   Right knee injury 02/14/2017   S/P radiation therapy 05/09/2023   S/P Robotic Assisted Right Video Thoracoscopy with resection of Thymus 04/25/2022   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   Past Surgical History:  Procedure Laterality Date   ABDOMINAL HYSTERECTOMY  03/22/2003   BACK SURGERY  03/21/1998   BREAST SURGERY     reconstruction   colon susrgery      CYSTOSCOPY WITH STENT PLACEMENT N/A 02/08/2023   Procedure: POSSIBLE CYSTOSCOPY WITH URETERAL STENT PLACEMENT;  Surgeon: Alvaro Ricardo KATHEE Mickey., MD;  Location: [facility];   Service: Urology;  Laterality: N/A;   LEFT HEART CATH AND CORONARY ANGIOGRAPHY N/A 07/20/2023   Procedure: LEFT HEART CATH AND CORONARY ANGIOGRAPHY;  Surgeon: Verlin Lonni BIRCH, MD;  Location: [facility];  Service: Cardiovascular;  Laterality: N/A;   MASTECTOMY Bilateral 03/21/1993   RESECTION OF A THYMOMA     04-25-22   REVISION TOTAL SHOULDER TO REVERSE TOTAL SHOULDER Right 09/07/2023   Procedure: REVISION, REVERSE TOTAL ARTHROPLASTY, SHOULDER;  Surgeon: Melita Drivers, MD;  Location: [facility];  Service: Orthopedics;  Laterality: Right;  HEMI ARTHROPLASTY, BONEGRAFT GLENOID   right total shoulder     Patient Active Problem List   Diagnosis Date Noted   S/P shoulder hemiarthroplasty, 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   Abnormal EKG 07/21/2023   Chest pain 07/20/2023   Preoperative cardiovascular examination 07/19/2023   Arthritis    Breast CA (HCC)    Hyperlipidemia    Laceration of muscle, fascia and tendon of long head of biceps, unspecified arm, initial encounter    Myasthenia gravis (HCC)    Pneumonia    S/P radiation therapy 05/09/2023   Hoarseness of voice 05/09/2023   Diverticular disease 02/08/2023   History of pulmonary embolism 12/22/2022   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   Pyelonephritis 11/10/2022   Prediabetes 05/25/2022  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   S/P Robotic Assisted Right Video Thoracoscopy with resection of Thymus 04/25/2022   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   Myasthenic syndrome (HCC) 04/12/2022   CVA (cerebral vascular accident) (HCC) 03/28/2022   HTN (hypertension) 03/28/2022   Chest mass 03/28/2022   Right knee injury 02/14/2017   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 10/16/2012   Osteoporosis 10/12/2012    PCP: Watt Harlene BROCKS, MD   REFERRING PROVIDER: Melita Drivers, MD   REFERRING DIAG: [phone] (ICD-10-CM) - Presence of right artificial shoulder joint   THERAPY DIAG:  Acute pain of right shoulder  Stiffness of right shoulder, not  elsewhere classified  Muscle weakness (generalized)  Cramp and spasm  RATIONALE FOR EVALUATION AND TREATMENT: Rehabilitation  ONSET DATE: Post-Op R TSA revision/conversion to hemi-arthroplasty on 09/07/2023   NEXT MD VISIT:  ~01/10/24 (~6 wks from 11/29/23)   SUBJECTIVE:                                                                                                                                                                                      SUBJECTIVE STATEMENT:  Pt reports the TENS unit seemed to help with her pain so she did order a unit for home.  She reports her pain has been better but seemed to aggravate reaching for the steering wheel while driving to PT.  She got good news from her oncologist yesterday.  Hand dominance: Right  PERTINENT HISTORY: MG, h/o radiation (March and May 2024) for mediastinal mass, Breast CA, HTN, OP, R TSA (09/07/23)  PAIN:  Are you having pain? Yes: NPRS scale: 3-4/10 Pain location: R anterior upper shoulder, scapular region upon waking in the morning  Pain description: mostly aching  Aggravating factors: reaching for the steering wheel  Relieving factors: Ice, medication   PRECAUTIONS:  Shoulder: physical therapist referral - Evaluate and treat per protocol status post revision right reverse total shoulder arthroplasty to CTA hemiarthroplasty, DOS 09/07/23 Patient unfortunately had a fracture at the base of the glenosphere making it unstable and requiring removal and conversion to hemiarthroplasty. Please adjust her physical therapy to be done under a very slow and steady and gradual advance of range of motion and gradual use of the right upper extremity within her pain tolerance. Please call me for any additional questions  RED FLAGS: None   WEIGHT BEARING RESTRICTIONS: No  FALLS:  Has patient fallen in last 6 months? Yes. Number of falls a week and a half ago, feel onto buttock while pulling weeds out of yard   LIVING  ENVIRONMENT: Lives with: lives alone Lives in: House/apartment Stairs: No Has following equipment at home:  Single point cane, shower chair, Grab bars, and walking stick   OCCUPATION: Retired   PLOF: Independent with household mobility with device and Leisure: watching tv, reading, walking her dog, social gatherings w/ friends to play card games   PATIENT GOALS: Get my shoulder to functioning, lifting shoulder, bearing weight   OBJECTIVE:  Note: Objective measures were completed at Evaluation unless otherwise noted.  DIAGNOSTIC FINDINGS:  09/20/2022: R shoulder XR  Results: Degenerative changes of the before meals and glenohumeral joints. No other acute findings.   PATIENT SURVEYS :  QuickDASH Score: 79.5 / 100 = 79.5 %  COGNITION: Overall cognitive status: Within functional limits for tasks assessed     SENSATION: WFL  POSTURE: Rounded shoulders, fwd head   UPPER EXTREMITY ROM:  LUE: Grossly WFL   Passive ROM Right eval R 11/02/23 R 11/08/23 R 11/27/23  R 11/30/23   Shoulder flexion Limited, p! Less than 90 (around 60) 84 90 120 120  Shoulder extension       Shoulder abduction Limited less than 90 74 scaption 80 scaption 100 100  Shoulder adduction       Shoulder internal rotation Limited p! 35 at 30 scaption     Shoulder external rotation Limitied p! 45 at 30 scaption 50  64 73  (Blank rows = not tested)  Active ROM R 11/30/23  Shoulder flexion 56  Shoulder extension   Shoulder abduction   Shoulder adduction   Shoulder extension   Shoulder internal rotation   Shoulder external rotation    (Blank rows = not tested)   UPPER EXTREMITY MMT:  MMT Right eval Left eval R 11/30/23  Shoulder flexion  4- 2  Shoulder extension     Shoulder abduction  4+   Shoulder adduction     Shoulder internal rotation  4-   Shoulder external rotation  4- 3-  Middle trapezius     Lower trapezius     Elbow flexion  5   Elbow extension  4+   Wrist flexion     Wrist extension      Wrist ulnar deviation     Wrist radial deviation     Wrist pronation     Wrist supination     Grip strength (lbs)     (Blank rows = not tested)  11/30/23:  R shoulder flexion active to 56 degrees R shoulder ER 3-/5  SHOULDER SPECIAL TESTS: Not Performed   JOINT MOBILITY TESTING:  Not Performed   PALPATION:  TTP: R ant glenoid, collar bone area (tender, not painful)                                                                                                                              TREATMENT DATE:   12/14/2023 THERAPEUTIC EXERCISE: To improve ROM and flexibility.  Demonstration, verbal and tactile cues throughout for technique.  Pulleys: Flexion & Scaption x 3 min each - emphasizing submax effort at ~25% effort  with eccentric lowering  Supine R shoulder flexion PROM with PT inhibiting UT/shoulder shrug and cues to minimize muscle activation/guarding - pain increased when pt attempting AAROM Supine R shoulder scaption P/AAROM - better tolerance today Supine R shoulder IR/ER P/AAROM in scapular plane S/L R shoulder flexion PROM progressing to AAROM within pain free ROM  MANUAL THERAPY: To promote normalized muscle tension, improved flexibility, improved joint mobility, increased ROM, pain modulation, and reduced pain utilizing joint mobilization, connective tissue massage, therapeutic massage, manual TP therapy, and myofascial release. Supine gentle R shoulder oscillation and grade I-II inferior glide for pain relief/muscle relxation R shoulder incisional scar massage Supine and S/L STM/DTM and gentle manual TPR to R infraspinatus and teres group  L S/L R scapular mobilization  MODALITIES:  TENS unit to R shoulder complex - intensity to pt tolerance x 20 min + moist heat pack for pain relief and decreased muscle guarding  SELF CARE: Provided education on home TENS unit set-up and use and continued limited active R UE use until pain better controlled.    12/12/2023   THERAPEUTIC EXERCISE: To improve ROM and flexibility.  Demonstration, verbal and tactile cues throughout for technique.  Pulleys: Flexion & Scaption x 3 min each - reviewed submax effort at ~25% effort with eccentric lowering  Supine R shoulder flexion and scaption P/AAROM with PT inhibiting UT/shoulder shrug   MANUAL THERAPY: To promote normalized muscle tension, improved flexibility, improved joint mobility, increased ROM, and pain modulation utilizing joint mobilization, connective tissue massage, therapeutic massage, and manual TP therapy.  Supine gentle R shoulder oscillation and grade I-II inferior glide for pain relief/muscle relxation Supine with towel roll supporting R post humerus, for gentle ROM R shoulder, with intermittent retrograde massage, STM/DTM and manual TPR to R UT, LS, pecs, subscapularis, teres group and infrapinatus  L S/L R scapular mobilization  MODALITIES:  TENS unit to R shoulder complex - intensity to pt tolerance x 20 min + moist heat pack for pain relief and decreased muscle guarding  SELF CARE: Provided education on pain management options.  Provided education on home TENS unit options including proper set up, precautions and recommended home frequency for use.   12/07/23:  Shoulder pulley flex, scaption, 2 min each.  The patient has been eccentrically lowering her R shoulder with the pulleys, advised her to reduce her engagement of musculature with eccentric lowering to 25% vs a maximal contraction that she has been utilizing. Supine with towel roll supporting R post humerus, for gentle ROM R shoulder, with intermittent retrograde massage.  Assisted R scapular/serratus punches 10x Supine chest press with yard stick 10x Supine manually resisted isometrics for R shoulder ER 10 reps, 5 sec holds Side lying L for R shoulder isometric adduction, towel roll in axilla,  5 sec holds, 10 reps  Side lying L for R shoulder ER 10 reps , pt reported soreness so  stopped Side lying for assisted R shoulder flexion 10 reps, therapist supported under R hand    11/30/23:  Reassessed for 10th visit progress report Progressed therex according to pts tolerance and to protocol, now 12 weeks post op Shoulder pulley x 3 min flexion and scaption, slow Supine for serratus punches, initially with therapist supporting R arm and then with yard stick Supine for chest presses with yard stick Supine L shoulder ER with yellow theraband, stabilizing, static position R shoulder Side lying L for R shoulder ER Side lying L for R shoulder flexion Updated HEP below   11/27/2023 THERAPEUTIC  EXERCISE: To improve strength, endurance, ROM, and flexibility.  Demonstration, verbal and tactile cues throughout for technique.  Pulleys: Flexion & Scaption x 3 min each  Standing R shoulder flexion/scaption wall slides x 5 - AAROM required with L hand at elbow- limited today Standing R shoulder flexion counter slides with 2# weight on washcloth 2 x 10 Standing R shoulder scaption counter slides with 2# weight on washcloth 2 x 10 Seated hand clasped AAROM shoulder flexion x 5  NEUROMUSCULAR RE-EDUCATION: To improve coordination, kinesthesia, and posture. R shoulder flexion YTB reactive isometric step-outs x 10 (neutral shoulder with elbow flexed to 90) R shoulder extension YTB reactive isometric step-outs x 10 (neutral shoulder with elbow flexed to 90) - cues for scapular retraction activation to stabilize shoulder R shoulder IR YTB reactive isometric step-outs x 10 (neutral shoulder with elbow flexed to 90) R shoulder ER YTB reactive isometric step-outs x 10 (neutral shoulder with elbow flexed to 90) - cues for scapular retraction activation to stabilize shoulder   11/23/2023 THERAPEUTIC EXERCISE: To improve strength, endurance, ROM, and flexibility.  Demonstration, verbal and tactile cues throughout for technique.  Pulleys: Flexion & Scaption x 3 min each  Standing R shoulder  flexion/scaption wall slides x 10 - AAROM required with L hand at elbow Standing R shoulder flexion counter slides with 2# weight on washcloth 2 x 10 Standing R shoulder scaption counter slides with 2# weight on washcloth 2 x 10 Seated B shoulder orange Pball flexion & scaption walk-out x 10 each (attempted initially on wall but deferred d/t excessive R shoulder shrugging  NEUROMUSCULAR RE-EDUCATION: To improve coordination, kinesthesia, and posture. R shoulder flexion YTB reactive isometric step-outs x 5 (neutral shoulder with elbow flexed to 90) R shoulder extension YTB reactive isometric step-outs x 5 (neutral shoulder with elbow flexed to 90) - cues for scapular retraction activation to stabilize shoulder R shoulder IR YTB reactive isometric step-outs x 5 (neutral shoulder with elbow flexed to 90) R shoulder ER YTB reactive isometric step-outs x 5 (neutral shoulder with elbow flexed to 90) - cues for scapular retraction activation to stabilize shoulder   11/21/2023  THERAPEUTIC EXERCISE: To improve strength, endurance, ROM, and flexibility.  Demonstration, verbal and tactile cues throughout for technique. Pulleys: Flexion & Scaption x 3 min each  Supine for gentle R shoulder PROM/stretching all planes Supine flexed elbow R shoulder flexion/scaption (uppercut) AA/AROM 2 x 10 - somewhat better tolerance for AROM but still slight guiding from PT initially with motion  MANUAL THERAPY: To promote normalized muscle tension, improved flexibility, improved joint mobility, and reduced pain utilizing connective tissue massage, therapeutic massage, manual TP therapy, and scar mobilization.  STM/XFM to R shoulder surgical incision Supine gentle R shoulder oscillation and grade I-II inferior glide for pain relief/muscle relxation  NEUROMUSCULAR RE-EDUCATION: To improve coordination, kinesthesia, and posture. S/L R shoulder ER maintaining mini-pillow under axilla and R elbow at 90 degrees 2 x 10 -  VC & TC for scapular engagement, avoiding shoulder shrug S/L R shoulder flexion - initially attempted with elbow straight but transitioned to R elbow at 90 degrees x 10  SELF CARE:  Provided education on sleeping position and use of pillow(s) to support R arm.    11/17/2023  THERAPEUTIC EXERCISE: To improve ROM and flexibility.  Demonstration, verbal and tactile cues throughout for technique.  Pulleys: Flexion & Scaption x 3 min each  Supine for gentle R shoulder PROM/stretching all planes Supine flexed elbow R shoulder flexion/scaption (uppercut) AA/AROM 2 x 10 -  AAROM d/t increased discomfort with AROM  MANUAL THERAPY: To promote normalized muscle tension, improved flexibility, improved joint mobility, and reduced pain utilizing connective tissue massage, therapeutic massage, manual TP therapy, and scar mobilization.  STM/XFM to R shoulder surgical incision STM/DTM and manual TPR to R pecs  NEUROMUSCULAR RE-EDUCATION: To improve coordination, kinesthesia, and posture. S/L R shoulder ER maintaining towel roll under axilla and R elbow at 90 degrees 2 x 10 - VC & TC for scapular engagement S/L R shoulder flexion - initially attempted with elbow straight but transitioned to R elbow at 90 degrees x 10 Seated YTB scap retraction + B shoulder row x 10 Seated YTB scap retraction + B shoulder extension stopping shy of neutral x 10   11/14/23: Pulleys 3 min flexion B Supine for gentle stretching R shoulder all planes Supine with R humerus supported in line with trunk for manually resisted R shoulder isometric inv/ev mass practice Side lying L for R shoulder ER maintaining towel roll under axilla and R elbow at 90 degrees Standing with R elbow supported on counter, pulling towel with 6# on towel from flexed position to neutral, then advance to sitting in chair for yellow t band rows, needed frequent cues to avoid R shoulder extension past trunk, had pt slide back in chair so that the seat back  blocked her from extending R shoulder    11/08/2023  THERAPEUTIC EXERCISE: To improve ROM and flexibility.  Demonstration, verbal and tactile cues throughout for technique.  Pulleys: Flexion x 3 min, Scaption x 3 min (scaption better tolerated today) Standing counter wash AAROM LUE: Flexion x 20 Scaption x 20 R shoulder PROM within pain tolerance for flexion, scaption, IR and ER Measured PROM Shoulder isometrics: flex, abd, ER, ext 5x5 Standing YTB scap retraction + B shoulder row- clicking clicking at first but after readjusting no clicking  Standing YTB scap retraction + B shoulder extension stopping shy of neutral x 10   11/06/2023  THERAPEUTIC EXERCISE: To improve ROM and flexibility.  Demonstration, verbal and tactile cues throughout for technique.  Pulleys: Flexion x 3 min, Scaption x 3 min (scaption better tolerated today) Seated R shoulder AAROM with Swiffer: Flexion 2 x 10 Scaption 2 x 10 R shoulder PROM by PT within pain tolerance for flexion, scaption, IR and ER  Hooklying R shoulder AAROM with cane: Flexion 2 x 10 Scaption x 10 - pt needing PT guidance/assistance for proper movement pattern  NEUROMUSCULAR RE-EDUCATION: To improve coordination, kinesthesia, posture, and proprioception. R shoulder rhythmic stabilization at 90 flexion (1-finger perturbations) 2 x 15-20 sec R shoulder protraction AAROM with wand 2 x 10 Standing YTB scap retraction + B shoulder row x 10 Standing YTB scap retraction + B shoulder extension stopping shy of neutral x 10   11/02/2023  THERAPEUTIC EXERCISE: To improve strength, endurance, ROM, and flexibility.  Demonstration, verbal and tactile cues throughout for technique.  Pulleys: Flexion x 3 min, Scaption x 1.5 min (discontinued d/t increasing discomfort with scaption) Table slides standing at counter top:  Flexion 2 x 10 Scaption 2 x 10 Seated R shoulder AAROM with Swiffer: Flexion 2 x 10 Scaption 2 x 10 R shoulder PROM by PT  within pain tolerance for flexion, scaption, IR and ER (ER limited to ~45 per protocol) Standing R shoulder submax (~25% effort) isometrics into wall 5 x 5 - flexion, abduction, extension, IR & ER Standing scapular retraction 10 x 5  SELF CARE:  Yellow Theraputty provided for home grip strengthening  10/30/2023 SELF CARE:  Reviewed eval findings and role of PT in addressing identified deficits as well as instruction in initial HEP (see below).    PATIENT EDUCATION:  Education details: home TENS unit options and home TENS unit set up, including recommended electrode placement and parameters adjustment  Person educated: Patient Education method: Explanation, Demonstration, Verbal cues, and Handouts Education comprehension: verbalized understanding and needs further education  HOME EXERCISE PROGRAM: Access Code: [email or web address] URL: [email or web address] Date: 11/30/2023 Prepared by: Greig Speaks  Exercises - Sidelying Shoulder External Rotation  - 1 x daily - 3 x weekly - 3 sets - 10 reps - Sidelying Shoulder Flexion 15 Degrees  - 1 x daily - 3 x weekly - 2 sets - 10 reps - 3 sec hold - Seated Shoulder Extension and Scapular Retraction with Resistance  - 1 x daily - 3 x weekly - 2 sets - 10 reps - 3-5 sec hold - Seated Shoulder Row with Anchored Resistance  - 1 x daily - 3 x weekly - 2 sets - 10 reps - 3-5 hold hold - Shoulder Scar Massage  - 1-2 x daily - 7 x weekly - 1-2 min hold - Supine Shoulder Protraction with Dowel  - 1 x daily - 7 x weekly - 3 sets - 10 reps - Supine Shoulder Press with Dowel  - 1 x daily - 7 x weekly - 3 sets - 10 reps - Supine Shoulder External Rotation with Resistance  - 1 x daily - 7 x weekly - 3 sets - 10 reps Access Code: [email or web address] URL: [email or web address] Date: 11/17/2023 Prepared by: Elijah Hidden  Exercises - Seated Shoulder Flexion Towel Slide at Table Top  - 1 x daily - 7 x weekly - 3 sets - 10 reps - Standing Single Arm  Shoulder Flexion Towel Slide at Table Top  - 1 x daily - 7 x weekly - 3 sets - 10 reps - Circular Shoulder Pendulum with Table Support  - 1 x daily - 7 x weekly - 3 sets - 10 reps - Isometric Shoulder Flexion at Wall  - 1 x daily - 3 x weekly - 2 sets - 5 reps - 5 sec hold - Isometric Shoulder Abduction at Wall  - 1 x daily - 3 x weekly - 2 sets - 5 reps - 5 sec hold - Isometric Shoulder Extension at Wall  - 1 x daily - 3 x weekly - 2 sets - 5 reps - 5 sec hold - Standing Isometric Shoulder Internal Rotation at Doorway  - 1 x daily - 3 x weekly - 2 sets - 5 reps - 5 sec hold - Standing Isometric Shoulder External Rotation with Doorway  - 1 x daily - 3 x weekly - 2 sets - 5 reps - 5 sec hold - Standing Scapular Retraction  - 1 x daily - 7 x weekly - 2 sets - 10 reps - 3-5 sec hold - Sidelying Shoulder External Rotation  - 1 x daily - 3 x weekly - 3 sets - 10 reps - Sidelying Shoulder Flexion 15 Degrees  - 1 x daily - 3 x weekly - 2 sets - 10 reps - 3 sec hold - Seated Shoulder Extension and Scapular Retraction with Resistance  - 1 x daily - 3 x weekly - 2 sets - 10 reps - 3-5 sec hold - Seated Shoulder Row with Anchored Resistance  - 1 x daily - 3 x weekly - 2 sets - 10 reps -  3-5 hold hold - Shoulder Scar Massage  - 1-2 x daily - 7 x weekly - 1-2 min hold   ASSESSMENT:  CLINICAL IMPRESSION: Tayra reports some relief from TENS unit last visit but pain increased today when attempting to lift her R hand onto the steering wheel while driving.  She remains very irritable in the posterior shoulder musculature, especially her infraspinatus - this was addressed with further MT with some relief noted.  Better tolerance for scaption motion today but still very guarded with flexion in supine, although better tolerated in side lying.  Reinforced relative rest for R shoulder until pain better controlled - focusing only on PROM w/o pain.  Session concluded with TENS estim again today as benefit noted from initial  attempt.  Provided review and further instruction on use of home TENS unit as patient reports she purchased one for home use.  Kierstyn will benefit from continued skilled PT to address ongoing ROM and strength deficits to improve functional UE use and activity tolerance with decreased pain interference.   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 Pt reports that she has orders to limit R arm use to waist level only, avoid reaching overhead at this time. She is having difficulty with forward and overhead reaching for items, opening cans w/ R hand, washing hair, and driving. She states that she has learned ways to compensate using her left arm even though she is R hand dominant. Today's assessment was done with all items remaining within patient's tolerance. PROM revealed limitations in flexion, IR, ER, and abd as movement was limited by both pain and muscular guarding. Pt had difficulty fully relaxing the R arm for PROM today and displayed hesitancy to movement beyond waist side. Pt was educated on the importance of taking recovery slow at this point (as advised) and avoid heavy lifting and resistance training after asking to try bicep curls with a small weight at home. Pt is aware that at this stage, it is important to focus of restoring as much ROM as she can and gradually progressing towards more strengthening activities with therapy. Pt scored a 79.5% on the QuickDASH representing severe disability of the UE in day to day activities.  Grisela will benefit from skilled PT intervention to address current deficits to improve functional ability, mobility, and activity tolerance w/ dec'd pain interference.   OBJECTIVE IMPAIRMENTS: decreased activity tolerance, decreased endurance, decreased knowledge of condition, decreased mobility, decreased ROM, decreased strength,  increased fascial restrictions, impaired perceived functional ability, increased muscle spasms, impaired UE functional use, postural dysfunction, and pain.   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   REHAB POTENTIAL: Good  CLINICAL DECISION MAKING: Evolving/moderate complexity  EVALUATION COMPLEXITY: Moderate  GOALS: Goals reviewed with patient? Yes  SHORT TERM GOALS: Target date: 12/11/2023    Patient will be independent with initial HEP.  Baseline: Goal status: MET - 11/16/23  2.  Patient will report at least 25% improvement in R shoulder  pain to improve QoL. Baseline: Worst 5-7/10 11/17/23 - pt noting shoulder more irritable over past week Goal status: IN PROGRESS - 12/12/23 -  R shoulder more irritable (3-4/10)  3.  Patient will improve R shoulder flexion PROM to 90 or better to aid in difficulty with forward reaching activities.  Baseline: limited by pain ~60 deg of flexion  Goal status: MET - 11/08/23 - no pain  4.  Patient will decrease her QuickDASH score by at least 15% to decrease severity of disability.  Baseline: 79.5% Goal status:  MET - 11/30/23 - QuickDASH Score: 45.5 / 100 = 45.5 %  LONG TERM GOALS: Target date: 01/22/2024  Patient will be independent with advanced HEP.  Baseline:  Goal status: IN PROGRESS - 11/30/23  2.  Patient will decrease her QuickDASH score to by at least 25-30% to improve function and activity tolerance d/t disability. Baseline:  79.5% Goal status: IN PROGRESS - 11/30/23: QuickDASH Score: 45.5 / 100 = 45.5 %  3.  Patient will be able to perform overhead activities such as washing hair, retrieving items from cabinets to improve independent function at home. Baseline: pt is having difficulty with above activities  Goal status: IN PROGRESS - 11/30/23: progressing not met  4.  Patient will report at least 50% improvement in R shoulder pain to improve QoL.  Baseline: 5-7/10 11/30/23:  0 at rest, up to 3 with use , met so far Goal status: IN PROGRESS - 12/14/23 - pain has been increased over past few visits (3-4/10 today)  5.  Patient will obtain at least 3+/5 MMT of UE for available range to increase activity tolerance.   Baseline: Unable to assess Goal status: IN PROGRESS - 11/30/23: progressing  PLAN: PT FREQUENCY: 2x/week  PT DURATION: 12 weeks  PLANNED INTERVENTIONS: 97164- PT Re-evaluation, 97750- Physical Performance Testing, 97110-Therapeutic exercises, 97530- Therapeutic activity, [id number]- Neuromuscular re-education, 97535- Self Care, [id number]- Manual therapy, [phone]- Gait training, [phone]- Electrical stimulation (unattended), 97035- Ultrasound, 02987- Traction (mechanical), 20560 (1-2 muscles), 20561 (3+ muscles)- Dry Needling, Patient/Family education, Taping, Joint mobilization, Cryotherapy, and Moist heat  PLAN FOR NEXT SESSION: Resume gentle R shoulder P/AA/AROM within pain free motion; review/revise HEP as indicated; may use Reverse Total Shoulder Arthroplasty for Fracture Protocol for guidance (Sx 09/07/23) - post-op week #14 as of 12/14/23   Elijah CHRISTELLA Hidden, PT 12/14/2023, 1:13 PM

## 2023-12-15 DIAGNOSIS — G7001 Myasthenia gravis with (acute) exacerbation: Secondary | ICD-10-CM | POA: Diagnosis not present

## 2023-12-15 DIAGNOSIS — G709 Myoneural disorder, unspecified: Secondary | ICD-10-CM | POA: Diagnosis not present

## 2023-12-21 ENCOUNTER — Ambulatory Visit: Attending: Orthopedic Surgery

## 2023-12-21 ENCOUNTER — Other Ambulatory Visit: Payer: Self-pay

## 2023-12-21 DIAGNOSIS — G8929 Other chronic pain: Secondary | ICD-10-CM | POA: Diagnosis not present

## 2023-12-21 DIAGNOSIS — R252 Cramp and spasm: Secondary | ICD-10-CM | POA: Insufficient documentation

## 2023-12-21 DIAGNOSIS — M25611 Stiffness of right shoulder, not elsewhere classified: Secondary | ICD-10-CM | POA: Diagnosis not present

## 2023-12-21 DIAGNOSIS — M6281 Muscle weakness (generalized): Secondary | ICD-10-CM | POA: Insufficient documentation

## 2023-12-21 DIAGNOSIS — M25511 Pain in right shoulder: Secondary | ICD-10-CM | POA: Insufficient documentation

## 2023-12-21 DIAGNOSIS — S46211A Strain of muscle, fascia and tendon of other parts of biceps, right arm, initial encounter: Secondary | ICD-10-CM | POA: Insufficient documentation

## 2023-12-21 NOTE — Therapy (Signed)
 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 10/16/2012   Pneumonia    Pre-diabetes    Prediabetes 05/25/2022   Pyelonephritis 11/10/2022   Right knee injury 02/14/2017   S/P radiation therapy 05/09/2023   S/P Robotic Assisted Right Video Thoracoscopy with resection of Thymus 04/25/2022   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   Past Surgical History:  Procedure Laterality Date   ABDOMINAL HYSTERECTOMY  03/22/2003   BACK SURGERY  03/21/1998   BREAST SURGERY     reconstruction   colon susrgery      CYSTOSCOPY WITH STENT PLACEMENT N/A 02/08/2023   Procedure: POSSIBLE CYSTOSCOPY WITH URETERAL STENT PLACEMENT;  Surgeon: Alvaro Ricardo KATHEE Mickey., MD;  Location: [facility];  Service: Urology;  Laterality: N/A;   LEFT HEART CATH AND CORONARY ANGIOGRAPHY N/A 07/20/2023   Procedure: LEFT HEART CATH AND CORONARY ANGIOGRAPHY;  Surgeon: Verlin Lonni BIRCH, MD;  Location: [facility]  INVASIVE CV LAB;  Service: Cardiovascular;  Laterality: N/A;   MASTECTOMY Bilateral 03/21/1993   RESECTION OF A THYMOMA     04-25-22   REVISION TOTAL SHOULDER TO REVERSE TOTAL SHOULDER Right 09/07/2023   Procedure: REVISION, REVERSE TOTAL ARTHROPLASTY, SHOULDER;  Surgeon: Melita Drivers, MD;  Location: [facility];  Service: Orthopedics;  Laterality: Right;  HEMI ARTHROPLASTY, BONEGRAFT GLENOID   right total shoulder     Patient Active Problem List   Diagnosis Date Noted   S/P shoulder hemiarthroplasty, 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   Abnormal EKG 07/21/2023   Chest pain 07/20/2023   Preoperative cardiovascular examination 07/19/2023   Arthritis    Breast CA (HCC)    Hyperlipidemia    Laceration of muscle, fascia and tendon of long head of biceps, unspecified arm, initial encounter    Myasthenia gravis (HCC)    Pneumonia    S/P radiation therapy 05/09/2023   Hoarseness of voice 05/09/2023   Diverticular disease 02/08/2023   History of pulmonary embolism 12/22/2022   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   Pyelonephritis 11/10/2022   Prediabetes 05/25/2022   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   S/P Robotic Assisted Right Video Thoracoscopy with resection of Thymus 04/25/2022   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   Myasthenic syndrome (HCC) 04/12/2022   CVA (cerebral vascular accident) (HCC) 03/28/2022   HTN (hypertension) 03/28/2022   Chest mass 03/28/2022   Right knee  injury 02/14/2017   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 10/16/2012   Osteoporosis 10/12/2012    PCP: Watt Harlene BROCKS, MD   REFERRING PROVIDER: Melita Drivers, MD   REFERRING DIAG: [phone] (ICD-10-CM) - Presence of right artificial shoulder joint   THERAPY DIAG:  Acute pain of right shoulder  Stiffness of right shoulder, not elsewhere classified  Muscle weakness (generalized)  Cramp and spasm  Chronic right shoulder pain  Rupture of right proximal biceps tendon, initial encounter  RATIONALE FOR EVALUATION AND  TREATMENT: Rehabilitation  ONSET DATE: Post-Op R TSA revision/conversion to hemi-arthroplasty on 09/07/2023   NEXT MD VISIT:  ~01/10/24 (~6 wks from 11/29/23)   SUBJECTIVE:                                                                                                                                                                                      SUBJECTIVE STATEMENT: sore R ant/sup shoulder.  I have to change and refill Brita filter bottle several times a day and have to reach up to the counter, I step up onto a stool but have to reach a lot  Hand dominance: Right  PERTINENT HISTORY: MG, h/o radiation (March and May 2024) for mediastinal mass, Breast CA, HTN, OP, R TSA (09/07/23)  PAIN:  Are you having pain? Yes: NPRS scale: 3-4/10 Pain location: R anterior upper shoulder, scapular region upon waking in the morning  Pain description: mostly aching  Aggravating factors: reaching for the steering wheel  Relieving factors: Ice, medication   PRECAUTIONS:  Shoulder: physical therapist referral - Evaluate and treat per protocol status post revision right reverse total shoulder arthroplasty to CTA hemiarthroplasty, DOS 09/07/23 Patient unfortunately had a fracture at the base of the glenosphere making it unstable and requiring removal and conversion to hemiarthroplasty. Please adjust her physical therapy to be done under a very slow and steady and gradual advance of range of motion and gradual use of the right upper extremity within her pain tolerance. Please call me for any additional questions  RED FLAGS: None   WEIGHT BEARING RESTRICTIONS: No  FALLS:  Has patient fallen in last 6 months? Yes. Number of falls a week and a half ago, feel onto buttock while pulling weeds out of yard   LIVING ENVIRONMENT: Lives with: lives alone Lives in: House/apartment Stairs: No Has following equipment at home: Single point cane, shower chair, Grab bars, and walking stick   OCCUPATION: Retired    PLOF: Independent with household mobility with device and Leisure: watching tv, reading, walking her dog, social gatherings w/ friends to play card games   PATIENT GOALS: Get my shoulder to functioning, lifting  shoulder, bearing weight   OBJECTIVE:  Note: Objective measures were completed at Evaluation unless otherwise noted.  DIAGNOSTIC FINDINGS:  09/20/2022: R shoulder XR  Results: Degenerative changes of the before meals and glenohumeral joints. No other acute findings.   PATIENT SURVEYS :  QuickDASH Score: 79.5 / 100 = 79.5 %  COGNITION: Overall cognitive status: Within functional limits for tasks assessed     SENSATION: WFL  POSTURE: Rounded shoulders, fwd head   UPPER EXTREMITY ROM:  LUE: Grossly WFL   Passive ROM Right eval R 11/02/23 R 11/08/23 R 11/27/23  R 11/30/23   Shoulder flexion Limited, p! Less than 90 (around 60) 84 90 120 120  Shoulder extension       Shoulder abduction Limited less than 90 74 scaption 80 scaption 100 100  Shoulder adduction       Shoulder internal rotation Limited p! 35 at 30 scaption     Shoulder external rotation Limitied p! 45 at 30 scaption 50  64 73  (Blank rows = not tested)  Active ROM R 11/30/23  Shoulder flexion 56  Shoulder extension   Shoulder abduction   Shoulder adduction   Shoulder extension   Shoulder internal rotation   Shoulder external rotation    (Blank rows = not tested)   UPPER EXTREMITY MMT:  MMT Right eval Left eval R 11/30/23  Shoulder flexion  4- 2  Shoulder extension     Shoulder abduction  4+   Shoulder adduction     Shoulder internal rotation  4-   Shoulder external rotation  4- 3-  Middle trapezius     Lower trapezius     Elbow flexion  5   Elbow extension  4+   Wrist flexion     Wrist extension     Wrist ulnar deviation     Wrist radial deviation     Wrist pronation     Wrist supination     Grip strength (lbs)     (Blank rows = not tested)  11/30/23:  R shoulder flexion active to 56  degrees R shoulder ER 3-/5  SHOULDER SPECIAL TESTS: Not Performed   JOINT MOBILITY TESTING:  Not Performed   PALPATION:  TTP: R ant glenoid, collar bone area (tender, not painful)                                                                                                                              TREATMENT DATE:  12/21/23:  UBE 3 min F, attempted backward but unable to avoid shrugging R shoulder so discontinued Supine for gentle PROM all planes R shoulder  Supine with towel roll under humerus, yellow t band ER L with static positioning R shoulder, with B humerus against trunk, mass practice Side lying L for R shoulder ER with towel under axilla, mass practice, really cued verbally and manually for R shoulder depression to avoid impinging ant/superior GH jt Side lying L for R shoulder  flexion, therapist assisted, manually depressed R scapula to achieve better movement pattern Standing for scaption , pt grasping sink and leaning posteriorly to distract R shoulder and flexing trunk to achieve gentle stretch into scaption Standing L forearm on counter for R shoulder extension, again emphasis on avoiding shrugging, engaging proper scapular retraction while moving R shoulder, advised pt to trial at home some this week.   12/14/2023 THERAPEUTIC EXERCISE: To improve ROM and flexibility.  Demonstration, verbal and tactile cues throughout for technique.  Pulleys: Flexion & Scaption x 3 min each - emphasizing submax effort at ~25% effort with eccentric lowering  Supine R should er flexion PROM with PT inhibiting UT/shoulder shrug and cues to minimize muscle activation/guarding - pain increased when pt attempting AAROM Supine R shoulder scaption P/AAROM - better tolerance today Supine R shoulder IR/ER P/AAROM in scapular plane S/L R shoulder flexion PROM progressing to AAROM within pain free ROM  MANUAL THERAPY: To promote normalized muscle tension, improved flexibility, improved joint  mobility, increased ROM, pain modulation, and reduced pain utilizing joint mobilization, connective tissue massage, therapeutic massage, manual TP therapy, and myofascial release. Supine gentle R shoulder oscillation and grade I-II inferior glide for pain relief/muscle relxation R shoulder incisional scar massage Supine and S/L STM/DTM and gentle manual TPR to R infraspinatus and teres group  L S/L R scapular mobilization  MODALITIES:  TENS unit to R shoulder complex - intensity to pt tolerance x 20 min + moist heat pack for pain relief and decreased muscle guarding  SELF CARE: Provided education on home TENS unit set-up and use and continued limited active R UE use until pain better controlled.    12/12/2023  THERAPEUTIC EXERCISE: To improve ROM and flexibility.  Demonstration, verbal and tactile cues throughout for technique.  Pulleys: Flexion & Scaption x 3 min each - reviewed submax effort at ~25% effort with eccentric lowering  Supine R shoulder flexion and scaption P/AAROM with PT inhibiting UT/shoulder shrug   MANUAL THERAPY: To promote normalized muscle tension, improved flexibility, improved joint mobility, increased ROM, and pain modulation utilizing joint mobilization, connective tissue massage, therapeutic massage, and manual TP therapy.  Supine gentle R shoulder oscillation and grade I-II inferior glide for pain relief/muscle relxation Supine with towel roll supporting R post humerus, for gentle ROM R shoulder, with intermittent retrograde massage, STM/DTM and manual TPR to R UT, LS, pecs, subscapularis, teres group and infrapinatus  L S/L R scapular mobilization  MODALITIES:  TENS unit to R shoulder complex - intensity to pt tolerance x 20 min + moist heat pack for pain relief and decreased muscle guarding  SELF CARE: Provided education on pain management options.  Provided education on home TENS unit options including proper set up, precautions and recommended home frequency  for use.   12/07/23:  Shoulder pulley flex, scaption, 2 min each.  The patient has been eccentrically lowering her R shoulder with the pulleys, advised her to reduce her engagement of musculature with eccentric lowering to 25% vs a maximal contraction that she has been utilizing. Supine with towel roll supporting R post humerus, for gentle ROM R shoulder, with intermittent retrograde massage.  Assisted R scapular/serratus punches 10x Supine chest press with yard stick 10x Supine manually resisted isometrics for R shoulder ER 10 reps, 5 sec holds Side lying L for R shoulder isometric adduction, towel roll in axilla,  5 sec holds, 10 reps  Side lying L for R shoulder ER 10 reps , pt reported soreness so stopped Side  lying for assisted R shoulder flexion 10 reps, therapist supported under R hand    11/30/23:  Reassessed for 10th visit progress report Progressed therex according to pts tolerance and to protocol, now 12 weeks post op Shoulder pulley x 3 min flexion and scaption, slow Supine for serratus punches, initially with therapist supporting R arm and then with yard stick Supine for chest presses with yard stick Supine L shoulder ER with yellow theraband, stabilizing, static position R shoulder Side lying L for R shoulder ER Side lying L for R shoulder flexion Updated HEP below   11/27/2023 THERAPEUTIC EXERCISE: To improve strength, endurance, ROM, and flexibility.  Demonstration, verbal and tactile cues throughout for technique.  Pulleys: Flexion & Scaption x 3 min each  Standing R shoulder flexion/scaption wall slides x 5 - AAROM required with L hand at elbow- limited today Standing R shoulder flexion counter slides with 2# weight on washcloth 2 x 10 Standing R shoulder scaption counter slides with 2# weight on washcloth 2 x 10 Seated hand clasped AAROM shoulder flexion x 5  NEUROMUSCULAR RE-EDUCATION: To improve coordination, kinesthesia, and posture. R shoulder flexion YTB  reactive isometric step-outs x 10 (neutral shoulder with elbow flexed to 90) R shoulder extension YTB reactive isometric step-outs x 10 (neutral shoulder with elbow flexed to 90) - cues for scapular retraction activation to stabilize shoulder R shoulder IR YTB reactive isometric step-outs x 10 (neutral shoulder with elbow flexed to 90) R shoulder ER YTB reactive isometric step-outs x 10 (neutral shoulder with elbow flexed to 90) - cues for scapular retraction activation to stabilize shoulder   11/23/2023 THERAPEUTIC EXERCISE: To improve strength, endurance, ROM, and flexibility.  Demonstration, verbal and tactile cues throughout for technique.  Pulleys: Flexion & Scaption x 3 min each  Standing R shoulder flexion/scaption wall slides x 10 - AAROM required with L hand at elbow Standing R shoulder flexion counter slides with 2# weight on washcloth 2 x 10 Standing R shoulder scaption counter slides with 2# weight on washcloth 2 x 10 Seated B shoulder orange Pball flexion & scaption walk-out x 10 each (attempted initially on wall but deferred d/t excessive R shoulder shrugging  NEUROMUSCULAR RE-EDUCATION: To improve coordination, kinesthesia, and posture. R shoulder flexion YTB reactive isometric step-outs x 5 (neutral shoulder with elbow flexed to 90) R shoulder extension YTB reactive isometric step-outs x 5 (neutral shoulder with elbow flexed to 90) - cues for scapular retraction activation to stabilize shoulder R shoulder IR YTB reactive isometric step-outs x 5 (neutral shoulder with elbow flexed to 90) R shoulder ER YTB reactive isometric step-outs x 5 (neutral shoulder with elbow flexed to 90) - cues for scapular retraction activation to stabilize shoulder   11/21/2023  THERAPEUTIC EXERCISE: To improve strength, endurance, ROM, and flexibility.  Demonstration, verbal and tactile cues throughout for technique. Pulleys: Flexion & Scaption x 3 min each  Supine for gentle R shoulder  PROM/stretching all planes Supine flexed elbow R shoulder flexion/scaption (uppercut) AA/AROM 2 x 10 - somewhat better tolerance for AROM but still slight guiding from PT initially with motion  MANUAL THERAPY: To promote normalized muscle tension, improved flexibility, improved joint mobility, and reduced pain utilizing connective tissue massage, therapeutic massage, manual TP therapy, and scar mobilization.  STM/XFM to R shoulder surgical incision Supine gentle R shoulder oscillation and grade I-II inferior glide for pain relief/muscle relxation  NEUROMUSCULAR RE-EDUCATION: To improve coordination, kinesthesia, and posture. S/L R shoulder ER maintaining mini-pillow under axilla and R elbow at  90 degrees 2 x 10 - VC & TC for scapular engagement, avoiding shoulder shrug S/L R shoulder flexion - initially attempted with elbow straight but transitioned to R elbow at 90 degrees x 10  SELF CARE:  Provided education on sleeping position and use of pillow(s) to support R arm.    11/17/2023  THERAPEUTIC EXERCISE: To improve ROM and flexibility.  Demonstration, verbal and tactile cues throughout for technique.  Pulleys: Flexion & Scaption x 3 min each  Supine for gentle R shoulder PROM/stretching all planes Supine flexed elbow R shoulder flexion/scaption (uppercut) AA/AROM 2 x 10 - AAROM d/t increased discomfort with AROM  MANUAL THERAPY: To promote normalized muscle tension, improved flexibility, improved joint mobility, and reduced pain utilizing connective tissue massage, therapeutic massage, manual TP therapy, and scar mobilization.  STM/XFM to R shoulder surgical incision STM/DTM and manual TPR to R pecs  NEUROMUSCULAR RE-EDUCATION: To improve coordination, kinesthesia, and posture. S/L R shoulder ER maintaining towel roll under axilla and R elbow at 90 degrees 2 x 10 - VC & TC for scapular engagement S/L R shoulder flexion - initially attempted with elbow straight but transitioned to R  elbow at 90 degrees x 10 Seated YTB scap retraction + B shoulder row x 10 Seated YTB scap retraction + B shoulder extension stopping shy of neutral x 10   11/14/23: Pulleys 3 min flexion B Supine for gentle stretching R shoulder all planes Supine with R humerus supported in line with trunk for manually resisted R shoulder isometric inv/ev mass practice Side lying L for R shoulder ER maintaining towel roll under axilla and R elbow at 90 degrees Standing with R elbow supported on counter, pulling towel with 6# on towel from flexed position to neutral, then advance to sitting in chair for yellow t band rows, needed frequent cues to avoid R shoulder extension past trunk, had pt slide back in chair so that the seat back blocked her from extending R shoulder    11/08/2023  THERAPEUTIC EXERCISE: To improve ROM and flexibility.  Demonstration, verbal and tactile cues throughout for technique.  Pulleys: Flexion x 3 min, Scaption x 3 min (scaption better tolerated today) Standing counter wash AAROM LUE: Flexion x 20 Scaption x 20 R shoulder PROM within pain tolerance for flexion, scaption, IR and ER Measured PROM Shoulder isometrics: flex, abd, ER, ext 5x5 Standing YTB scap retraction + B shoulder row- clicking clicking at first but after readjusting no clicking  Standing YTB scap retraction + B shoulder extension stopping shy of neutral x 10   11/06/2023  THERAPEUTIC EXERCISE: To improve ROM and flexibility.  Demonstration, verbal and tactile cues throughout for technique.  Pulleys: Flexion x 3 min, Scaption x 3 min (scaption better tolerated today) Seated R shoulder AAROM with Swiffer: Flexion 2 x 10 Scaption 2 x 10 R shoulder PROM by PT within pain tolerance for flexion, scaption, IR and ER  Hooklying R shoulder AAROM with cane: Flexion 2 x 10 Scaption x 10 - pt needing PT guidance/assistance for proper movement pattern  NEUROMUSCULAR RE-EDUCATION: To improve coordination, kinesthesia,  posture, and proprioception. R shoulder rhythmic stabilization at 90 flexion (1-finger perturbations) 2 x 15-20 sec R shoulder protraction AAROM with wand 2 x 10 Standing YTB scap retraction + B shoulder row x 10 Standing YTB scap retraction + B shoulder extension stopping shy of neutral x 10   11/02/2023  THERAPEUTIC EXERCISE: To improve strength, endurance, ROM, and flexibility.  Demonstration, verbal and tactile cues throughout for  technique.  Pulleys: Flexion x 3 min, Scaption x 1.5 min (discontinued d/t increasing discomfort with scaption) Table slides standing at counter top:  Flexion 2 x 10 Scaption 2 x 10 Seated R shoulder AAROM with Swiffer: Flexion 2 x 10 Scaption 2 x 10 R shoulder PROM by PT within pain tolerance for flexion, scaption, IR and ER (ER limited to ~45 per protocol) Standing R shoulder submax (~25% effort) isometrics into wall 5 x 5 - flexion, abduction, extension, IR & ER Standing scapular retraction 10 x 5  SELF CARE:  Yellow Theraputty provided for home grip strengthening   10/30/2023 SELF CARE:  Reviewed eval findings and role of PT in addressing identified deficits as well as instruction in initial HEP (see below).    PATIENT EDUCATION:  Education details: home TENS unit options and home TENS unit set up, including recommended electrode placement and parameters adjustment  Person educated: Patient Education method: Explanation, Demonstration, Verbal cues, and Handouts Education comprehension: verbalized understanding and needs further education  HOME EXERCISE PROGRAM: Access Code: [email or web address] URL: [email or web address] Date: 11/30/2023 Prepared by: Greig Halee Glynn  Exercises - Sidelying Shoulder External Rotation  - 1 x daily - 3 x weekly - 3 sets - 10 reps - Sidelying Shoulder Flexion 15 Degrees  - 1 x daily - 3 x weekly - 2 sets - 10 reps - 3 sec hold - Seated Shoulder Extension and Scapular Retraction with Resistance  - 1 x daily - 3  x weekly - 2 sets - 10 reps - 3-5 sec hold - Seated Shoulder Row with Anchored Resistance  - 1 x daily - 3 x weekly - 2 sets - 10 reps - 3-5 hold hold - Shoulder Scar Massage  - 1-2 x daily - 7 x weekly - 1-2 min hold - Supine Shoulder Protraction with Dowel  - 1 x daily - 7 x weekly - 3 sets - 10 reps - Supine Shoulder Press with Dowel  - 1 x daily - 7 x weekly - 3 sets - 10 reps - Supine Shoulder External Rotation with Resistance  - 1 x daily - 7 x weekly - 3 sets - 10 reps Access Code: [email or web address] URL: [email or web address] Date: 11/17/2023 Prepared by: Elijah Hidden  Exercises - Seated Shoulder Flexion Towel Slide at Table Top  - 1 x daily - 7 x weekly - 3 sets - 10 reps - Standing Single Arm Shoulder Flexion Towel Slide at Table Top  - 1 x daily - 7 x weekly - 3 sets - 10 reps - Circular Shoulder Pendulum with Table Support  - 1 x daily - 7 x weekly - 3 sets - 10 reps - Isometric Shoulder Flexion at Wall  - 1 x daily - 3 x weekly - 2 sets - 5 reps - 5 sec hold - Isometric Shoulder Abduction at Wall  - 1 x daily - 3 x weekly - 2 sets - 5 reps - 5 sec hold - Isometric Shoulder Extension at Wall  - 1 x daily - 3 x weekly - 2 sets - 5 reps - 5 sec hold - Standing Isometric Shoulder Internal Rotation at Doorway  - 1 x daily - 3 x weekly - 2 sets - 5 reps - 5 sec hold - Standing Isometric Shoulder External Rotation with Doorway  - 1 x daily - 3 x weekly - 2 sets - 5 reps - 5 sec hold - Standing Scapular Retraction  - 1 x daily - 7 x weekly - 2  sets - 10 reps - 3-5 sec hold - Sidelying Shoulder External Rotation  - 1 x daily - 3 x weekly - 3 sets - 10 reps - Sidelying Shoulder Flexion 15 Degrees  - 1 x daily - 3 x weekly - 2 sets - 10 reps - 3 sec hold - Seated Shoulder Extension and Scapular Retraction with Resistance  - 1 x daily - 3 x weekly - 2 sets - 10 reps - 3-5 sec hold - Seated Shoulder Row with Anchored Resistance  - 1 x daily - 3 x weekly - 2 sets - 10 reps - 3-5 hold  hold - Shoulder Scar Massage  - 1-2 x daily - 7 x weekly - 1-2 min hold   ASSESSMENT:  CLINICAL IMPRESSION: Sheranda reports some relief from TENS unit at home.  Has been resting from shoulder pulley .  Consistent R shoulder anterior and superior jt pain , exacerbating by reaching activities such as driving and refilling her water  filter . She is going to consider adding a filter under her kitchen sink instead of last visit but pain increased today when attempting to lift her R hand onto the steering wheel while driving.we continued to emphasize measured, gentle, light exercise.   Baudelia will benefit from continued skilled PT to address ongoing ROM and strength deficits to improve functional UE use and activity tolerance with decreased pain interference.   EVAL: Javon Hupfer is a 78 y/o F referred to PT for the evaluation and treatment of R shoulder pain s/p R Reverse TSA (08/18/23) and repair of the artificial joint via hemiarthroplasty (09/07/23). Pt reports that she has orders to limit R arm use to waist level only, avoid reaching overhead at this time. She is having difficulty with forward and overhead reaching for items, opening cans w/ R hand, washing hair, and driving. She states that she has learned ways to compensate using her left arm even though she is R hand dominant. Today's assessment was done with all items remaining within patient's tolerance. PROM revealed limitations in flexion, IR, ER, and abd as movement was limited by both pain and muscular guarding. Pt had difficulty fully relaxing the R arm for PROM today and displayed hesitancy to movement beyond waist side. Pt was educated on the importance of taking recovery slow at this point (as advised) and avoid heavy lifting and resistance training after asking to try bicep curls with a small weight at home. Pt is aware that at this stage, it is important to focus of restoring as much ROM as she can and gradually progressing towards more strengthening  activities with therapy. Pt scored a 79.5% on the QuickDASH representing severe disability of the UE in day to day activities.  Safire will benefit from skilled PT intervention to address current deficits to improve functional ability, mobility, and activity tolerance w/ dec'd pain interference.   OBJECTIVE IMPAIRMENTS: decreased activity tolerance, decreased endurance, decreased knowledge of condition, decreased mobility, decreased ROM, decreased strength, increased fascial restrictions, impaired perceived functional ability, increased muscle spasms, impaired UE functional use, postural dysfunction, and pain.   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   REHAB POTENTIAL: Good  CLINICAL DECISION MAKING: Evolving/moderate complexity  EVALUATION COMPLEXITY: Moderate  GOALS: Goals reviewed with patient? Yes  SHORT TERM GOALS: Target date: 12/11/2023    Patient will be independent with initial HEP.  Baseline: Goal status: MET - 11/16/23  2.  Patient will report at least 25% improvement in R shoulder pain to improve QoL. Baseline: Worst 5-7/10 11/17/23 - pt noting shoulder more irritable over past week Goal status: IN PROGRESS - 12/12/23 -  R shoulder more irritable (3-4/10)  3.  Patient will improve R shoulder flexion PROM to 90 or better to aid in difficulty with forward reaching activities.  Baseline: limited by pain ~60 deg of flexion  Goal status: MET - 11/08/23 - no pain  4.   Patient will decrease her QuickDASH score by at least 15% to decrease severity of disability.  Baseline: 79.5% Goal status:  MET - 11/30/23 - QuickDASH Score: 45.5 / 100 = 45.5 %  LONG TERM GOALS: Target date: 01/22/2024  Patient will be independent with advanced HEP.  Baseline:  Goal status: IN PROGRESS - 11/30/23  2.  Patient will decrease her QuickDASH score to by at least 25-30% to improve function and activity tolerance d/t disability. Baseline: 79.5% Goal status: IN PROGRESS - 11/30/23: QuickDASH Score: 45.5 / 100 = 45.5 %  3.  Patient will be able to perform overhead activities such as washing hair, retrieving items from cabinets to improve independent function at home. Baseline: pt is having difficulty with above activities  Goal status: IN PROGRESS - 11/30/23: progressing not met  4.  Patient will report at least 50% improvement in R shoulder pain to improve QoL.  Baseline: 5-7/10 11/30/23:  0 at rest, up to 3 with use , met so far Goal status: IN PROGRESS - 12/14/23 - pain has been increased over past few visits (3-4/10 today)  5.  Patient will obtain at least 3+/5 MMT of UE for available range to increase activity tolerance.   Baseline: Unable to assess Goal status: IN PROGRESS - 11/30/23: progressing  PLAN: PT FREQUENCY: 2x/week  PT DURATION: 12 weeks  PLANNED INTERVENTIONS: 97164- PT Re-evaluation, 97750- Physical Performance Testing, 97110-Therapeutic exercises, 97530- Therapeutic activity, [id number]- Neuromuscular re-education, 97535- Self Care, [id number]- Manual therapy, [id number]- Gait training, [phone]- Electrical stimulation (unattended), [id number]- Ultrasound, [id number]- Traction (mechanical), 20560 (1-2 muscles), 20561 (3+ muscles)- Dry Needling, Patient/Family education, Taping, Joint mobilization, Cryotherapy, and Moist heat  PLAN FOR NEXT SESSION: Resume gentle R shoulder P/AA/AROM within pain free motion; review/revise HEP as indicated; may use Reverse Total Shoulder Arthroplasty for  Fracture Protocol for guidance (Sx 09/07/23) - post-op week #15 as of 12/21/23   Rafeef Lau L Jodeen Mclin, PT 12/21/2023, 1:28 PM

## 2023-12-26 ENCOUNTER — Encounter: Payer: Self-pay | Admitting: Physical Therapy

## 2023-12-26 ENCOUNTER — Ambulatory Visit: Admitting: Physical Therapy

## 2023-12-26 DIAGNOSIS — M6281 Muscle weakness (generalized): Secondary | ICD-10-CM | POA: Diagnosis not present

## 2023-12-26 DIAGNOSIS — M25611 Stiffness of right shoulder, not elsewhere classified: Secondary | ICD-10-CM | POA: Diagnosis not present

## 2023-12-26 DIAGNOSIS — G8929 Other chronic pain: Secondary | ICD-10-CM | POA: Diagnosis not present

## 2023-12-26 DIAGNOSIS — R252 Cramp and spasm: Secondary | ICD-10-CM | POA: Diagnosis not present

## 2023-12-26 DIAGNOSIS — S46211A Strain of muscle, fascia and tendon of other parts of biceps, right arm, initial encounter: Secondary | ICD-10-CM | POA: Diagnosis not present

## 2023-12-26 DIAGNOSIS — M25511 Pain in right shoulder: Secondary | ICD-10-CM

## 2023-12-26 NOTE — Therapy (Signed)
 OUTPATIENT PHYSICAL THERAPY TREATMENT    Patient Name: Faith Massey MRN: [id number] DOB:04-May-1945, 78 y.o., female Today's Date: 12/26/2023  END OF SESSION:  PT End of Session - 12/26/23 1150     Visit Number 15    Date for Recertification  01/22/24    Authorization Type [employer]    Progress Note Due on Visit 20    PT Start Time 1150    PT Stop Time 1236    PT Time Calculation (min) 46 min    Activity Tolerance Patient tolerated treatment well;Patient limited by fatigue    Behavior During Therapy [facility] for tasks assessed/performed                       Past Medical History:  Diagnosis Date   Arthritis    Breast CA (HCC)    Chest mass 03/28/2022   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 10/16/2012   Pneumonia    Pre-diabetes    Prediabetes 05/25/2022   Pyelonephritis 11/10/2022   Right knee injury 02/14/2017   S/P radiation therapy 05/09/2023   S/P Robotic Assisted Right Video Thoracoscopy with resection of Thymus 04/25/2022   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   Past Surgical History:  Procedure Laterality Date   ABDOMINAL HYSTERECTOMY  03/22/2003   BACK SURGERY  03/21/1998   BREAST SURGERY     reconstruction   colon susrgery      CYSTOSCOPY WITH STENT PLACEMENT N/A 02/08/2023   Procedure: POSSIBLE CYSTOSCOPY WITH URETERAL STENT PLACEMENT;  Surgeon: Alvaro Ricardo KATHEE Mickey., MD;  Location: [facility];  Service: Urology;  Laterality: N/A;   LEFT HEART CATH AND CORONARY ANGIOGRAPHY N/A 07/20/2023   Procedure: LEFT HEART CATH AND CORONARY ANGIOGRAPHY;  Surgeon: Verlin Lonni BIRCH, MD;  Location: [facility];  Service: Cardiovascular;  Laterality: N/A;   MASTECTOMY Bilateral 03/21/1993   RESECTION OF A THYMOMA     04-25-22   REVISION TOTAL SHOULDER TO REVERSE TOTAL SHOULDER Right 09/07/2023   Procedure: REVISION, REVERSE TOTAL ARTHROPLASTY, SHOULDER;  Surgeon: Melita Drivers, MD;  Location: [facility];  Service: Orthopedics;  Laterality: Right;  HEMI ARTHROPLASTY, BONEGRAFT GLENOID   right total shoulder     Patient Active Problem List   Diagnosis Date Noted   S/P shoulder hemiarthroplasty, 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   Abnormal EKG 07/21/2023   Chest pain 07/20/2023   Preoperative cardiovascular examination 07/19/2023   Arthritis    Breast CA (HCC)    Hyperlipidemia    Laceration of muscle, fascia and tendon of long head of biceps, unspecified arm, initial encounter    Myasthenia gravis (HCC)    Pneumonia    S/P radiation therapy 05/09/2023   Hoarseness of voice 05/09/2023   Diverticular disease 02/08/2023   History of pulmonary embolism 12/22/2022   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   Pyelonephritis 11/10/2022  Prediabetes 05/25/2022   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   S/P Robotic Assisted Right Video Thoracoscopy with resection of Thymus 04/25/2022   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   Myasthenic syndrome (HCC) 04/12/2022   CVA (cerebral vascular accident) (HCC) 03/28/2022   HTN (hypertension) 03/28/2022   Chest mass 03/28/2022   Right knee injury 02/14/2017   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 10/16/2012   Osteoporosis 10/12/2012    PCP: Watt Harlene BROCKS, MD   REFERRING PROVIDER: Melita Drivers, MD   REFERRING DIAG: [phone] (ICD-10-CM) - Presence of right artificial shoulder joint   THERAPY DIAG:  Acute pain of right  shoulder  Stiffness of right shoulder, not elsewhere classified  Muscle weakness (generalized)  Cramp and spasm  RATIONALE FOR EVALUATION AND TREATMENT: Rehabilitation  ONSET DATE: Post-Op R TSA revision/conversion to hemi-arthroplasty on 09/07/2023   NEXT MD VISIT:  ~01/10/24 (~6 wks from 11/29/23)   SUBJECTIVE:                                                                                                                                                                                      SUBJECTIVE STATEMENT: sore R ant/sup shoulder.  I have to change and refill Brita filter bottle several times a day and have to reach up to the counter, I step up onto a stool but have to reach a lot  Hand dominance: Right  PERTINENT HISTORY: MG, h/o radiation (March and May 2024) for mediastinal mass, Breast CA, HTN, OP, R TSA (09/07/23)  PAIN:  Are you having pain? Yes: NPRS scale: 0-1/10 Pain location: R anterior upper shoulder, scapular region upon waking in the morning  Pain description: mostly aching  Aggravating factors: reaching for the steering wheel  Relieving factors: Ice, medication   PRECAUTIONS:  Shoulder: physical therapist referral - Evaluate and treat per protocol status post revision right reverse total shoulder arthroplasty to CTA hemiarthroplasty, DOS 09/07/23 Patient unfortunately had a fracture at the base of the glenosphere making it unstable and requiring removal and conversion to hemiarthroplasty. Please adjust her physical therapy to be done under a very slow and steady and gradual advance of range of motion and gradual use of the right upper extremity within her pain tolerance. Please call me for any additional questions  RED FLAGS: None   WEIGHT BEARING RESTRICTIONS: No  FALLS:  Has patient fallen in last 6 months? Yes. Number of falls a week and a half ago, feel onto buttock while pulling weeds out of yard   LIVING ENVIRONMENT: Lives with: lives alone Lives in:  House/apartment Stairs: No Has following equipment at home: Single point cane, shower chair, Grab bars, and  walking stick   OCCUPATION: Retired   PLOF: Independent with household mobility with device and Leisure: watching tv, reading, walking her dog, social gatherings w/ friends to play card games   PATIENT GOALS: Get my shoulder to functioning, lifting shoulder, bearing weight   OBJECTIVE:  Note: Objective measures were completed at Evaluation unless otherwise noted.  DIAGNOSTIC FINDINGS:  09/20/2022: R shoulder XR  Results: Degenerative changes of the before meals and glenohumeral joints. No other acute findings.   PATIENT SURVEYS :  QuickDASH Score: 79.5 / 100 = 79.5 %  COGNITION: Overall cognitive status: Within functional limits for tasks assessed     SENSATION: WFL  POSTURE: Rounded shoulders, fwd head   UPPER EXTREMITY ROM:  LUE: Grossly WFL   Passive ROM Right eval R 11/02/23 R 11/08/23 R 11/27/23  R 11/30/23   Shoulder flexion Limited, p! Less than 90 (around 60) 84 90 120 120  Shoulder extension       Shoulder abduction Limited less than 90 74 scaption 80 scaption 100 100  Shoulder adduction       Shoulder internal rotation Limited p! 35 at 30 scaption     Shoulder external rotation Limitied p! 45 at 30 scaption 50  64 73  (Blank rows = not tested)  Active ROM R 11/30/23  Shoulder flexion 56  Shoulder extension   Shoulder abduction   Shoulder adduction   Shoulder extension   Shoulder internal rotation   Shoulder external rotation    (Blank rows = not tested)   UPPER EXTREMITY MMT:  MMT Right eval Left eval R 11/30/23  Shoulder flexion  4- 2  Shoulder extension     Shoulder abduction  4+   Shoulder adduction     Shoulder internal rotation  4-   Shoulder external rotation  4- 3-  Middle trapezius     Lower trapezius     Elbow flexion  5   Elbow extension  4+   Wrist flexion     Wrist extension     Wrist ulnar deviation     Wrist radial  deviation     Wrist pronation     Wrist supination     Grip strength (lbs)     (Blank rows = not tested)  11/30/23:  R shoulder flexion active to 56 degrees R shoulder ER 3-/5  SHOULDER SPECIAL TESTS: Not Performed   JOINT MOBILITY TESTING:  Not Performed   PALPATION:  TTP: R ant glenoid, collar bone area (tender, not painful)                                                                                                                              TREATMENT DATE:   12/26/2023 THERAPEUTIC EXERCISE: To improve strength, endurance, ROM, and flexibility.  Demonstration, verbal and tactile cues throughout for technique.  Pulleys: Flexion & Scaption x 3 min each - emphasizing submax effort at ~25% effort with eccentric lowering  Gentle R  UT and LS stretches x 30 each  MANUAL THERAPY: To promote normalized muscle tension, improved flexibility, improved joint mobility, increased ROM, and pain modulation utilizing connective tissue massage, therapeutic massage, and manual TP therapy.  STM/DTM and manual TPR to R UT and LS  THERAPEUTIC ACTIVITIES: To improve functional performance.  Demonstration, verbal and tactile cues throughout for technique.  R shoulder AAROM wall slides with slight support from L hand - cues to avoid shoulder shrug - fatigues quickly Functional reaches R shoulder flexion with hand to 14 box placed on counter - increased shoulder shrug observed, therefore reduced height to 9 stool on counter and performed in flexion and scaption to point of fatigue/increased substitution  NEUROMUSCULAR RE-EDUCATION: To improve coordination, kinesthesia, posture, proprioception, and amplitude of movement.  PT providing tactile cues with fingertips on medial border of scapula. Scap retraction + small ROM B shoulder row (emphasis on scapular movement/activation) 2 x 5 Scap retraction + small ROM B shoulder extension (emphasis on scapular movement/activation) 2 x 5   12/21/23  UBE 3  min F, attempted backward but unable to avoid shrugging R shoulder so discontinued Supine for gentle PROM all planes R shoulder  Supine with towel roll under humerus, yellow t band ER L with static positioning R shoulder, with B humerus against trunk, mass practice Side lying L for R shoulder ER with towel under axilla, mass practice, really cued verbally and manually for R shoulder depression to avoid impinging ant/superior GH jt Side lying L for R shoulder flexion, therapist assisted, manually depressed R scapula to achieve better movement pattern Standing for scaption , pt grasping sink and leaning posteriorly to distract R shoulder and flexing trunk to achieve gentle stretch into scaption Standing L forearm on counter for R shoulder extension, again emphasis on avoiding shrugging, engaging proper scapular retraction while moving R shoulder, advised pt to trial at home some this week.   12/14/2023 THERAPEUTIC EXERCISE: To improve ROM and flexibility.  Demonstration, verbal and tactile cues throughout for technique.  Pulleys: Flexion & Scaption x 3 min each - emphasizing submax effort at ~25% effort with eccentric lowering  Supine R should er flexion PROM with PT inhibiting UT/shoulder shrug and cues to minimize muscle activation/guarding - pain increased when pt attempting AAROM Supine R shoulder scaption P/AAROM - better tolerance today Supine R shoulder IR/ER P/AAROM in scapular plane S/L R shoulder flexion PROM progressing to AAROM within pain free ROM  MANUAL THERAPY: To promote normalized muscle tension, improved flexibility, improved joint mobility, increased ROM, pain modulation, and reduced pain utilizing joint mobilization, connective tissue massage, therapeutic massage, manual TP therapy, and myofascial release. Supine gentle R shoulder oscillation and grade I-II inferior glide for pain relief/muscle relxation R shoulder incisional scar massage Supine and S/L STM/DTM and gentle manual  TPR to R infraspinatus and teres group  L S/L R scapular mobilization  MODALITIES:  TENS unit to R shoulder complex - intensity to pt tolerance x 20 min + moist heat pack for pain relief and decreased muscle guarding  SELF CARE: Provided education on home TENS unit set-up and use and continued limited active R UE use until pain better controlled.    12/12/2023  THERAPEUTIC EXERCISE: To improve ROM and flexibility.  Demonstration, verbal and tactile cues throughout for technique.  Pulleys: Flexion & Scaption x 3 min each - reviewed submax effort at ~25% effort with eccentric lowering  Supine R shoulder flexion and scaption P/AAROM with PT inhibiting UT/shoulder shrug   MANUAL THERAPY:  To promote normalized muscle tension, improved flexibility, improved joint mobility, increased ROM, and pain modulation utilizing joint mobilization, connective tissue massage, therapeutic massage, and manual TP therapy.  Supine gentle R shoulder oscillation and grade I-II inferior glide for pain relief/muscle relxation Supine with towel roll supporting R post humerus, for gentle ROM R shoulder, with intermittent retrograde massage, STM/DTM and manual TPR to R UT, LS, pecs, subscapularis, teres group and infrapinatus  L S/L R scapular mobilization  MODALITIES:  TENS unit to R shoulder complex - intensity to pt tolerance x 20 min + moist heat pack for pain relief and decreased muscle guarding  SELF CARE: Provided education on pain management options.  Provided education on home TENS unit options including proper set up, precautions and recommended home frequency for use.   12/07/23:  Shoulder pulley flex, scaption, 2 min each.  The patient has been eccentrically lowering her R shoulder with the pulleys, advised her to reduce her engagement of musculature with eccentric lowering to 25% vs a maximal contraction that she has been utilizing. Supine with towel roll supporting R post humerus, for gentle ROM R  shoulder, with intermittent retrograde massage.  Assisted R scapular/serratus punches 10x Supine chest press with yard stick 10x Supine manually resisted isometrics for R shoulder ER 10 reps, 5 sec holds Side lying L for R shoulder isometric adduction, towel roll in axilla,  5 sec holds, 10 reps  Side lying L for R shoulder ER 10 reps , pt reported soreness so stopped Side lying for assisted R shoulder flexion 10 reps, therapist supported under R hand    11/30/23:  Reassessed for 10th visit progress report Progressed therex according to pts tolerance and to protocol, now 12 weeks post op Shoulder pulley x 3 min flexion and scaption, slow Supine for serratus punches, initially with therapist supporting R arm and then with yard stick Supine for chest presses with yard stick Supine L shoulder ER with yellow theraband, stabilizing, static position R shoulder Side lying L for R shoulder ER Side lying L for R shoulder flexion Updated HEP below   11/27/2023 THERAPEUTIC EXERCISE: To improve strength, endurance, ROM, and flexibility.  Demonstration, verbal and tactile cues throughout for technique.  Pulleys: Flexion & Scaption x 3 min each  Standing R shoulder flexion/scaption wall slides x 5 - AAROM required with L hand at elbow- limited today Standing R shoulder flexion counter slides with 2# weight on washcloth 2 x 10 Standing R shoulder scaption counter slides with 2# weight on washcloth 2 x 10 Seated hand clasped AAROM shoulder flexion x 5  NEUROMUSCULAR RE-EDUCATION: To improve coordination, kinesthesia, and posture. R shoulder flexion YTB reactive isometric step-outs x 10 (neutral shoulder with elbow flexed to 90) R shoulder extension YTB reactive isometric step-outs x 10 (neutral shoulder with elbow flexed to 90) - cues for scapular retraction activation to stabilize shoulder R shoulder IR YTB reactive isometric step-outs x 10 (neutral shoulder with elbow flexed to 90) R shoulder ER  YTB reactive isometric step-outs x 10 (neutral shoulder with elbow flexed to 90) - cues for scapular retraction activation to stabilize shoulder   11/23/2023 THERAPEUTIC EXERCISE: To improve strength, endurance, ROM, and flexibility.  Demonstration, verbal and tactile cues throughout for technique.  Pulleys: Flexion & Scaption x 3 min each  Standing R shoulder flexion/scaption wall slides x 10 - AAROM required with L hand at elbow Standing R shoulder flexion counter slides with 2# weight on washcloth 2 x 10 Standing R shoulder scaption  counter slides with 2# weight on washcloth 2 x 10 Seated B shoulder orange Pball flexion & scaption walk-out x 10 each (attempted initially on wall but deferred d/t excessive R shoulder shrugging  NEUROMUSCULAR RE-EDUCATION: To improve coordination, kinesthesia, and posture. R shoulder flexion YTB reactive isometric step-outs x 5 (neutral shoulder with elbow flexed to 90) R shoulder extension YTB reactive isometric step-outs x 5 (neutral shoulder with elbow flexed to 90) - cues for scapular retraction activation to stabilize shoulder R shoulder IR YTB reactive isometric step-outs x 5 (neutral shoulder with elbow flexed to 90) R shoulder ER YTB reactive isometric step-outs x 5 (neutral shoulder with elbow flexed to 90) - cues for scapular retraction activation to stabilize shoulder   11/21/2023  THERAPEUTIC EXERCISE: To improve strength, endurance, ROM, and flexibility.  Demonstration, verbal and tactile cues throughout for technique. Pulleys: Flexion & Scaption x 3 min each  Supine for gentle R shoulder PROM/stretching all planes Supine flexed elbow R shoulder flexion/scaption (uppercut) AA/AROM 2 x 10 - somewhat better tolerance for AROM but still slight guiding from PT initially with motion  MANUAL THERAPY: To promote normalized muscle tension, improved flexibility, improved joint mobility, and reduced pain utilizing connective tissue massage, therapeutic  massage, manual TP therapy, and scar mobilization.  STM/XFM to R shoulder surgical incision Supine gentle R shoulder oscillation and grade I-II inferior glide for pain relief/muscle relxation  NEUROMUSCULAR RE-EDUCATION: To improve coordination, kinesthesia, and posture. S/L R shoulder ER maintaining mini-pillow under axilla and R elbow at 90 degrees 2 x 10 - VC & TC for scapular engagement, avoiding shoulder shrug S/L R shoulder flexion - initially attempted with elbow straight but transitioned to R elbow at 90 degrees x 10  SELF CARE:  Provided education on sleeping position and use of pillow(s) to support R arm.    11/17/2023  THERAPEUTIC EXERCISE: To improve ROM and flexibility.  Demonstration, verbal and tactile cues throughout for technique.  Pulleys: Flexion & Scaption x 3 min each  Supine for gentle R shoulder PROM/stretching all planes Supine flexed elbow R shoulder flexion/scaption (uppercut) AA/AROM 2 x 10 - AAROM d/t increased discomfort with AROM  MANUAL THERAPY: To promote normalized muscle tension, improved flexibility, improved joint mobility, and reduced pain utilizing connective tissue massage, therapeutic massage, manual TP therapy, and scar mobilization.  STM/XFM to R shoulder surgical incision STM/DTM and manual TPR to R pecs  NEUROMUSCULAR RE-EDUCATION: To improve coordination, kinesthesia, and posture. S/L R shoulder ER maintaining towel roll under axilla and R elbow at 90 degrees 2 x 10 - VC & TC for scapular engagement S/L R shoulder flexion - initially attempted with elbow straight but transitioned to R elbow at 90 degrees x 10 Seated YTB scap retraction + B shoulder row x 10 Seated YTB scap retraction + B shoulder extension stopping shy of neutral x 10   11/14/23: Pulleys 3 min flexion B Supine for gentle stretching R shoulder all planes Supine with R humerus supported in line with trunk for manually resisted R shoulder isometric inv/ev mass practice Side  lying L for R shoulder ER maintaining towel roll under axilla and R elbow at 90 degrees Standing with R elbow supported on counter, pulling towel with 6# on towel from flexed position to neutral, then advance to sitting in chair for yellow t band rows, needed frequent cues to avoid R shoulder extension past trunk, had pt slide back in chair so that the seat back blocked her from extending R shoulder  11/08/2023  THERAPEUTIC EXERCISE: To improve ROM and flexibility.  Demonstration, verbal and tactile cues throughout for technique.  Pulleys: Flexion x 3 min, Scaption x 3 min (scaption better tolerated today) Standing counter wash AAROM LUE: Flexion x 20 Scaption x 20 R shoulder PROM within pain tolerance for flexion, scaption, IR and ER Measured PROM Shoulder isometrics: flex, abd, ER, ext 5x5 Standing YTB scap retraction + B shoulder row- clicking clicking at first but after readjusting no clicking  Standing YTB scap retraction + B shoulder extension stopping shy of neutral x 10   11/06/2023  THERAPEUTIC EXERCISE: To improve ROM and flexibility.  Demonstration, verbal and tactile cues throughout for technique.  Pulleys: Flexion x 3 min, Scaption x 3 min (scaption better tolerated today) Seated R shoulder AAROM with Swiffer: Flexion 2 x 10 Scaption 2 x 10 R shoulder PROM by PT within pain tolerance for flexion, scaption, IR and ER  Hooklying R shoulder AAROM with cane: Flexion 2 x 10 Scaption x 10 - pt needing PT guidance/assistance for proper movement pattern  NEUROMUSCULAR RE-EDUCATION: To improve coordination, kinesthesia, posture, and proprioception. R shoulder rhythmic stabilization at 90 flexion (1-finger perturbations) 2 x 15-20 sec R shoulder protraction AAROM with wand 2 x 10 Standing YTB scap retraction + B shoulder row x 10 Standing YTB scap retraction + B shoulder extension stopping shy of neutral x 10   11/02/2023  THERAPEUTIC EXERCISE: To improve strength,  endurance, ROM, and flexibility.  Demonstration, verbal and tactile cues throughout for technique.  Pulleys: Flexion x 3 min, Scaption x 1.5 min (discontinued d/t increasing discomfort with scaption) Table slides standing at counter top:  Flexion 2 x 10 Scaption 2 x 10 Seated R shoulder AAROM with Swiffer: Flexion 2 x 10 Scaption 2 x 10 R shoulder PROM by PT within pain tolerance for flexion, scaption, IR and ER (ER limited to ~45 per protocol) Standing R shoulder submax (~25% effort) isometrics into wall 5 x 5 - flexion, abduction, extension, IR & ER Standing scapular retraction 10 x 5  SELF CARE:  Yellow Theraputty provided for home grip strengthening   10/30/2023 SELF CARE:  Reviewed eval findings and role of PT in addressing identified deficits as well as instruction in initial HEP (see below).    PATIENT EDUCATION:  Education details: HEP review and cautioned patient to adjust reps/set to avoid increased pain and pushing past point of fatigue/loss of control  Person educated: Patient Education method: Explanation, Demonstration, Verbal cues, and Tactile cues Education comprehension: verbalized understanding, returned demonstration, verbal cues required, tactile cues required, and needs further education  HOME EXERCISE PROGRAM: Access Code: [email or web address] URL: [email or web address] Date: 11/30/2023 Prepared by: Greig Speaks  Exercises - Sidelying Shoulder External Rotation  - 1 x daily - 3 x weekly - 3 sets - 10 reps - Sidelying Shoulder Flexion 15 Degrees  - 1 x daily - 3 x weekly - 2 sets - 10 reps - 3 sec hold - Seated Shoulder Extension and Scapular Retraction with Resistance  - 1 x daily - 3 x weekly - 2 sets - 10 reps - 3-5 sec hold - Seated Shoulder Row with Anchored Resistance  - 1 x daily - 3 x weekly - 2 sets - 10 reps - 3-5 hold hold - Shoulder Scar Massage  - 1-2 x daily - 7 x weekly - 1-2 min hold - Supine Shoulder Protraction with Dowel  - 1 x daily -  7 x weekly - 3 sets - 10 reps -  Supine Shoulder Press with Dowel  - 1 x daily - 7 x weekly - 3 sets - 10 reps - Supine Shoulder External Rotation with Resistance  - 1 x daily - 7 x weekly - 3 sets - 10 reps   ASSESSMENT:  CLINICAL IMPRESSION: Faith Massey reports she has been resting as instructed and her pain is significantly better today, even with activities such as driving which previously triggered her.  Eased back into AA/AROM in functional patterns, closely monitoring for substitution and loss of control with fatigue, cautioning patient to be aware of not only avoiding forcing painful motions, but also recognizing fatigue/loss of good control and stopping exercises even if full set not completed.  Promoted improved scapular activation/engagement with tactile cues during scapular strengthening but patient still demonstrating tendency for R scapular winging with fatigue.  Faith Massey will benefit from continued skilled PT to address ongoing ROM and strength deficits to improve functional UE use and activity tolerance with decreased pain interference.   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 Pt reports that she has orders to limit R arm use to waist level only, avoid reaching overhead at this time. She is having difficulty with forward and overhead reaching for items, opening cans w/ R hand, washing hair, and driving. She states that she has learned ways to compensate using her left arm even though she is R hand dominant. Today's assessment was done with all items remaining within patient's tolerance. PROM revealed limitations in flexion, IR, ER, and abd as movement was limited by both pain and muscular guarding. Pt had difficulty fully relaxing the R arm for PROM today and displayed hesitancy to movement beyond waist side. Pt was educated on the importance of  taking recovery slow at this point (as advised) and avoid heavy lifting and resistance training after asking to try bicep curls with a small weight at home. Pt is aware that at this stage, it is important to focus of restoring as much ROM as she can and gradually progressing towards more strengthening activities with therapy. Pt scored a 79.5% on the QuickDASH representing severe disability of the UE in day to day activities.  Faith Massey will benefit from skilled PT intervention to address current deficits to improve functional ability, mobility, and activity tolerance w/ dec'd pain interference.   OBJECTIVE IMPAIRMENTS: decreased activity tolerance, decreased endurance, decreased knowledge of condition, decreased mobility, decreased ROM, decreased strength, increased fascial restrictions, impaired perceived functional ability, increased muscle spasms, impaired UE functional use, postural dysfunction, and pain.   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   REHAB POTENTIAL: Good  CLINICAL DECISION MAKING: Evolving/moderate complexity  EVALUATION COMPLEXITY: Moderate  GOALS: Goals reviewed with patient? Yes  SHORT TERM GOALS: Target date: 12/11/2023    Patient will be independent with initial HEP.  Baseline: Goal status: MET - 11/16/23  2.  Patient will report at least 25% improvement in R shoulder pain to improve QoL. Baseline: Worst 5-7/10 11/17/23 - pt  noting shoulder more irritable  over past week Goal status: IN PROGRESS - 12/12/23 -  R shoulder more irritable (3-4/10)  3.  Patient will improve R shoulder flexion PROM to 90 or better to aid in difficulty with forward reaching activities.  Baseline: limited by pain ~60 deg of flexion  Goal status: MET - 11/08/23 - no pain  4.  Patient will decrease her QuickDASH score by at least 15% to decrease severity of disability.  Baseline: 79.5% Goal status:  MET - 11/30/23 - QuickDASH Score: 45.5 / 100 = 45.5 %  LONG TERM GOALS: Target date: 01/22/2024  Patient will be independent with advanced HEP.  Baseline:  Goal status: IN PROGRESS - 12/26/23 - cautioned pt to ease back into shoulder pulleys & scapular exercises with HEP, avoiding increased pain and being aware of fatigue and adjusting reps/sets accordingly  2.  Patient will decrease her QuickDASH score to by at least 25-30% to improve function and activity tolerance d/t disability. Baseline: 79.5% Goal status: IN PROGRESS - 11/30/23 - QuickDASH Score: 45.5 / 100 = 45.5 %  3.  Patient will be able to perform overhead activities such as washing hair, retrieving items from cabinets to improve independent function at home. Baseline: pt is having difficulty with above activities  Goal status: IN PROGRESS - 11/30/23: progressing not met  4.  Patient will report at least 50% improvement in R shoulder pain to improve QoL.  Baseline: 5-7/10 11/30/23:  0 at rest, up to 3 with use , met so far Goal status: IN PROGRESS - 12/14/23 - pain has been increased over past few visits (3-4/10 today)  5.  Patient will obtain at least 3+/5 MMT of UE for available range to increase activity tolerance.   Baseline: Unable to assess Goal status: IN PROGRESS - 11/30/23: progressing   PLAN: PT FREQUENCY: 2x/week  PT DURATION: 12 weeks  PLANNED INTERVENTIONS: 97164- PT Re-evaluation, 97750- Physical Performance Testing, 97110-Therapeutic exercises, 97530- Therapeutic activity, [id number]- Neuromuscular  re-education, 97535- Self Care, 02859- Manual therapy, [id number]- Gait training, [phone]- Electrical stimulation (unattended), 97035- Ultrasound, 02987- Traction (mechanical), 20560 (1-2 muscles), 20561 (3+ muscles)- Dry Needling, Patient/Family education, Taping, Joint mobilization, Cryotherapy, and Moist heat  PLAN FOR NEXT SESSION: Resume gentle R shoulder P/AA/AROM within pain free motion; review/revise HEP as indicated; may use Reverse Total Shoulder Arthroplasty for Fracture Protocol for guidance (Sx 09/07/23) - post-op week #16 as of 12/28/23   Elijah CHRISTELLA Hidden, PT 12/26/2023, 1:09 PM

## 2023-12-28 ENCOUNTER — Ambulatory Visit

## 2023-12-28 ENCOUNTER — Other Ambulatory Visit: Payer: Self-pay

## 2023-12-28 DIAGNOSIS — M6281 Muscle weakness (generalized): Secondary | ICD-10-CM

## 2023-12-28 DIAGNOSIS — R252 Cramp and spasm: Secondary | ICD-10-CM | POA: Diagnosis not present

## 2023-12-28 DIAGNOSIS — M25511 Pain in right shoulder: Secondary | ICD-10-CM | POA: Diagnosis not present

## 2023-12-28 DIAGNOSIS — S46211A Strain of muscle, fascia and tendon of other parts of biceps, right arm, initial encounter: Secondary | ICD-10-CM | POA: Diagnosis not present

## 2023-12-28 DIAGNOSIS — M25611 Stiffness of right shoulder, not elsewhere classified: Secondary | ICD-10-CM | POA: Diagnosis not present

## 2023-12-28 DIAGNOSIS — G8929 Other chronic pain: Secondary | ICD-10-CM | POA: Diagnosis not present

## 2023-12-28 NOTE — Therapy (Signed)
 OUTPATIENT PHYSICAL THERAPY TREATMENT    Patient Name: Faith Massey MRN: [id number] DOB:1945-08-24, 78 y.o., female Today's Date: 12/28/2023  END OF SESSION:  PT End of Session - 12/28/23 1149     Visit Number 16    Date for Recertification  01/22/24    Authorization Type [employer]    Progress Note Due on Visit 20    PT Start Time 1147    PT Stop Time 1230    PT Time Calculation (min) 43 min    Activity Tolerance Patient tolerated treatment well;Patient limited by fatigue    Behavior During Therapy [facility] for tasks assessed/performed           Past Medical History:  Diagnosis Date   Arthritis    Breast CA (HCC)    Chest mass 03/28/2022   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 10/16/2012   Pneumonia    Pre-diabetes    Prediabetes 05/25/2022   Pyelonephritis 11/10/2022   Right knee injury 02/14/2017   S/P radiation therapy 05/09/2023   S/P Robotic Assisted Right Video Thoracoscopy with resection of Thymus 04/25/2022   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   Past Surgical History:  Procedure Laterality Date   ABDOMINAL HYSTERECTOMY  03/22/2003   BACK SURGERY  03/21/1998   BREAST SURGERY     reconstruction   colon susrgery      CYSTOSCOPY WITH STENT PLACEMENT N/A 02/08/2023   Procedure: POSSIBLE CYSTOSCOPY WITH URETERAL STENT PLACEMENT;  Surgeon: Alvaro Ricardo KATHEE Mickey., MD;   Location: [facility];  Service: Urology;  Laterality: N/A;   LEFT HEART CATH AND CORONARY ANGIOGRAPHY N/A 07/20/2023   Procedure: LEFT HEART CATH AND CORONARY ANGIOGRAPHY;  Surgeon: Verlin Lonni BIRCH, MD;  Location: [facility];  Service: Cardiovascular;  Laterality: N/A;   MASTECTOMY Bilateral 03/21/1993   RESECTION OF A THYMOMA     04-25-22   REVISION TOTAL SHOULDER TO REVERSE TOTAL SHOULDER Right 09/07/2023   Procedure: REVISION, REVERSE TOTAL ARTHROPLASTY, SHOULDER;  Surgeon: Melita Drivers, MD;  Location: [facility];  Service: Orthopedics;  Laterality: Right;  HEMI ARTHROPLASTY, BONEGRAFT GLENOID   right total shoulder     Patient Active Problem List   Diagnosis Date Noted   S/P shoulder hemiarthroplasty, 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   Abnormal EKG 07/21/2023   Chest pain 07/20/2023   Preoperative cardiovascular examination 07/19/2023   Arthritis    Breast CA (HCC)    Hyperlipidemia    Laceration of muscle, fascia and tendon of long head of biceps, unspecified arm, initial encounter    Myasthenia gravis (HCC)    Pneumonia    S/P radiation therapy 05/09/2023   Hoarseness of voice 05/09/2023   Diverticular disease 02/08/2023   History of pulmonary embolism 12/22/2022   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   Pyelonephritis 11/10/2022   Prediabetes 05/25/2022   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  S/P Robotic Assisted Right Video Thoracoscopy with resection of Thymus 04/25/2022   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   Myasthenic syndrome (HCC) 04/12/2022   CVA (cerebral vascular accident) (HCC) 03/28/2022   HTN (hypertension) 03/28/2022   Chest mass 03/28/2022   Right knee injury 02/14/2017   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 10/16/2012   Osteoporosis 10/12/2012    PCP: Watt Harlene BROCKS, MD   REFERRING PROVIDER: Melita Drivers, MD   REFERRING DIAG: [id number] (ICD-10-CM) - Presence of right artificial shoulder joint   THERAPY DIAG:  Acute pain of right shoulder  Stiffness of  right shoulder, not elsewhere classified  Muscle weakness (generalized)  RATIONALE FOR EVALUATION AND TREATMENT: Rehabilitation  ONSET DATE: Post-Op R TSA revision/conversion to hemi-arthroplasty on 09/07/2023   NEXT MD VISIT:  ~01/10/24 (~6 wks from 11/29/23)   SUBJECTIVE:                                                                                                                                                                                      SUBJECTIVE STATEMENT: Still feeling good, haven't been using the shoulder pulley yet, may resume on Saturday if still ok.  Hand dominance: Right  PERTINENT HISTORY: MG, h/o radiation (March and May 2024) for mediastinal mass, Breast CA, HTN, OP, R TSA (09/07/23)  PAIN:  Are you having pain? Yes: NPRS scale: 0-1/10 Pain location: R anterior upper shoulder, scapular region upon waking in the morning  Pain description: mostly aching  Aggravating factors: reaching for the steering wheel  Relieving factors: Ice, medication   PRECAUTIONS:  Shoulder: physical therapist referral - Evaluate and treat per protocol status post revision right reverse total shoulder arthroplasty to CTA hemiarthroplasty, DOS 09/07/23 Patient unfortunately had a fracture at the base of the glenosphere making it unstable and requiring removal and conversion to hemiarthroplasty. Please adjust her physical therapy to be done under a very slow and steady and gradual advance of range of motion and gradual use of the right upper extremity within her pain tolerance. Please call me for any additional questions  RED FLAGS: None   WEIGHT BEARING RESTRICTIONS: No  FALLS:  Has patient fallen in last 6 months? Yes. Number of falls a week and a half ago, feel onto buttock while pulling weeds out of yard   LIVING ENVIRONMENT: Lives with: lives alone Lives in: House/apartment Stairs: No Has following equipment at home: Single point cane, shower chair, Grab bars, and walking stick    OCCUPATION: Retired   PLOF: Independent with household mobility with device and Leisure: watching tv, reading, walking her dog, social gatherings w/ friends to play card games   PATIENT GOALS: Get  my shoulder to functioning, lifting shoulder, bearing weight   OBJECTIVE:  Note: Objective measures were completed at Evaluation unless otherwise noted.  DIAGNOSTIC FINDINGS:  09/20/2022: R shoulder XR  Results: Degenerative changes of the before meals and glenohumeral joints. No other acute findings.   PATIENT SURVEYS :  QuickDASH Score: 79.5 / 100 = 79.5 %  COGNITION: Overall cognitive status: Within functional limits for tasks assessed     SENSATION: WFL  POSTURE: Rounded shoulders, fwd head   UPPER EXTREMITY ROM:  LUE: Grossly WFL   Passive ROM Right eval R 11/02/23 R 11/08/23 R 11/27/23  R 11/30/23   Shoulder flexion Limited, p! Less than 90 (around 60) 84 90 120 120  Shoulder extension       Shoulder abduction Limited less than 90 74 scaption 80 scaption 100 100  Shoulder adduction       Shoulder internal rotation Limited p! 35 at 30 scaption     Shoulder external rotation Limitied p! 45 at 30 scaption 50  64 73  (Blank rows = not tested)  Active ROM R 11/30/23  Shoulder flexion 56  Shoulder extension   Shoulder abduction   Shoulder adduction   Shoulder extension   Shoulder internal rotation   Shoulder external rotation    (Blank rows = not tested)   UPPER EXTREMITY MMT:  MMT Right eval Left eval R 11/30/23  Shoulder flexion  4- 2  Shoulder extension     Shoulder abduction  4+   Shoulder adduction     Shoulder internal rotation  4-   Shoulder external rotation  4- 3-  Middle trapezius     Lower trapezius     Elbow flexion  5   Elbow extension  4+   Wrist flexion     Wrist extension     Wrist ulnar deviation     Wrist radial deviation     Wrist pronation     Wrist supination     Grip strength (lbs)     (Blank rows = not tested)  11/30/23:  R  shoulder flexion active to 56 degrees R shoulder ER 3-/5  SHOULDER SPECIAL TESTS: Not Performed   JOINT MOBILITY TESTING:  Not Performed   PALPATION:  TTP: R ant glenoid, collar bone area (tender, not painful)                                                                                                                              TREATMENT DATE:  12/28/23:  Seated for shoulder pulley, PROM on R, flex and scaption 2-3 min each Supine for retrograde massage, and gentle AAROM  Supine for various rhythmic stabilization ex: With towel roll under R elbow for shoulder IR/ER elbow 90 degrees, shoulder 30 degrees from trunk Scapular punches with therapist supporting arm lightly in supine PNF sword pulls from L ant hip to R shoulder scaption position, therapist providing manual support and resistance.   Seated for playground  ball squeeze, engaging B shoulder depression and small forward shoulder flexion Seated for manually assisted R UE horizontal adduction/ abduction , gentle, small range Seated rows with yellow theraband, small movements, pt able to maintain B shoulder depression with the rows Seated R triceps extension therapist providing manual resistance    12/26/2023 THERAPEUTIC EXERCISE: To improve strength, endurance, ROM, and flexibility.  Demonstration, verbal and tactile cues throughout for technique.  Pulleys: Flexion & Scaption x 3 min each - emphasizing submax effort at ~25% effort with eccentric lowering  Gentle R UT and LS stretches x 30 each  MANUAL THERAPY: To promote normalized muscle tension, improved flexibility, improved joint mobility, increased ROM, and pain modulation utilizing connective tissue massage, therapeutic massage, and manual TP therapy.  STM/DTM and manual TPR to R UT and LS  THERAPEUTIC ACTIVITIES: To improve functional performance.  Demonstration, verbal and tactile cues throughout for technique.  R shoulder AAROM wall slides with slight support from  L hand - cues to avoid shoulder shrug - fatigues quickly Functional reaches R shoulder flexion with hand to 14 box placed on counter - increased shoulder shrug observed, therefore reduced height to 9 stool on counter and performed in flexion and scaption to point of fatigue/increased substitution  NEUROMUSCULAR RE-EDUCATION: To improve coordination, kinesthesia, posture, proprioception, and amplitude of movement.  PT providing tactile cues with fingertips on medial border of scapula. Scap retraction + small ROM B shoulder row (emphasis on scapular movement/activation) 2 x 5 Scap retraction + small ROM B shoulder extension +emphasis on scapular movement/activation) 2 x 5   12/21/23  Therapeutic activity: UBE 3 min F, attempted backward but unable to avoid shrugging R shoulder so discontinued Supine for gentle PROM all planes R shoulder  Supine with towel roll under humerus, yellow t band ER L with static positioning R shoulder, with B humerus against trunk, mass practice Side lying L for R shoulder ER with towel under axilla, mass practice, really cued verbally and manually for R shoulder depression to avoid impinging ant/superior GH jt Side lying L for R shoulder flexion, therapist assisted, manually depressed R scapula to achieve better movement pattern Standing for scaption , pt grasping sink and leaning posteriorly to distract R shoulder and flexing trunk to achieve gentle stretch into scaption Standing L forearm on counter for R shoulder extension, again emphasis on avoiding shrugging, engaging proper scapular retraction while moving R shoulder, advised pt to trial at home some this week.   12/14/2023 THERAPEUTIC EXERCISE: To improve ROM and flexibility.  Demonstration, verbal and tactile cues throughout for technique.  Pulleys: Flexion & Scaption x 3 min each - emphasizing submax effort at ~25% effort with eccentric lowering  Supine R should er flexion PROM with PT inhibiting UT/shoulder  shrug and cues to minimize muscle activation/guarding - pain increased when pt attempting AAROM Supine R shoulder scaption P/AAROM - better tolerance today Supine R shoulder IR/ER P/AAROM in scapular plane S/L R shoulder flexion PROM progressing to AAROM within pain free ROM  MANUAL THERAPY: To promote normalized muscle tension, improved flexibility, improved joint mobility, increased ROM, pain modulation, and reduced pain utilizing joint mobilization, connective tissue massage, therapeutic massage, manual TP therapy, and myofascial release. Supine gentle R shoulder oscillation and grade I-II inferior glide for pain relief/muscle relxation R shoulder incisional scar massage Supine and S/L STM/DTM and gentle manual TPR to R infraspinatus and teres group  L S/L R scapular mobilization  MODALITIES:  TENS unit to R shoulder complex - intensity to pt  tolerance x 20 min + moist heat pack for pain relief and decreased muscle guarding  SELF CARE: Provided education on home TENS unit set-up and use and continued limited active R UE use until pain better controlled.    12/12/2023  THERAPEUTIC EXERCISE: To improve ROM and flexibility.  Demonstration, verbal and tactile cues throughout for technique.  Pulleys: Flexion & Scaption x 3 min each - reviewed submax effort at ~25% effort with eccentric lowering  Supine R shoulder flexion and scaption P/AAROM with PT inhibiting UT/shoulder shrug   MANUAL THERAPY: To promote normalized muscle tension, improved flexibility, improved joint mobility, increased ROM, and pain modulation utilizing joint mobilization, connective tissue massage, therapeutic massage, and manual TP therapy.  Supine gentle R shoulder oscillation and grade I-II inferior glide for pain relief/muscle relxation Supine with towel roll supporting R post humerus, for gentle ROM R shoulder, with intermittent retrograde massage, STM/DTM and manual TPR to R UT, LS, pecs, subscapularis, teres group and  infrapinatus  L S/L R scapular mobilization  MODALITIES:  TENS unit to R shoulder complex - intensity to pt tolerance x 20 min + moist heat pack for pain relief and decreased muscle guarding  SELF CARE: Provided education on pain management options.  Provided education on home TENS unit options including proper set up, precautions and recommended home frequency for use.   12/07/23:  Shoulder pulley flex, scaption, 2 min each.  The patient has been eccentrically lowering her R shoulder with the pulleys, advised her to reduce her engagement of musculature with eccentric lowering to 25% vs a maximal contraction that she has been utilizing. Supine with towel roll supporting R post humerus, for gentle ROM R shoulder, with intermittent retrograde massage.  Assisted R scapular/serratus punches 10x Supine chest press with yard stick 10x Supine manually resisted isometrics for R shoulder ER 10 reps, 5 sec holds Side lying L for R shoulder isometric adduction, towel roll in axilla,  5 sec holds, 10 reps  Side lying L for R shoulder ER 10 reps , pt reported soreness so stopped Side lying for assisted R shoulder flexion 10 reps, therapist supported under R hand    11/30/23:  Reassessed for 10th visit progress report Progressed therex according to pts tolerance and to protocol, now 12 weeks post op Shoulder pulley x 3 min flexion and scaption, slow Supine for serratus punches, initially with therapist supporting R arm and then with yard stick Supine for chest presses with yard stick Supine L shoulder ER with yellow theraband, stabilizing, static position R shoulder Side lying L for R shoulder ER Side lying L for R shoulder flexion Updated HEP below   11/27/2023 THERAPEUTIC EXERCISE: To improve strength, endurance, ROM, and flexibility.  Demonstration, verbal and tactile cues throughout for technique.  Pulleys: Flexion & Scaption x 3 min each  Standing R shoulder flexion/scaption wall slides x 5 -  AAROM required with L hand at elbow- limited today Standing R shoulder flexion counter slides with 2# weight on washcloth 2 x 10 Standing R shoulder scaption counter slides with 2# weight on washcloth 2 x 10 Seated hand clasped AAROM shoulder flexion x 5  NEUROMUSCULAR RE-EDUCATION: To improve coordination, kinesthesia, and posture. R shoulder flexion YTB reactive isometric step-outs x 10 (neutral shoulder with elbow flexed to 90) R shoulder extension YTB reactive isometric step-outs x 10 (neutral shoulder with elbow flexed to 90) - cues for scapular retraction activation to stabilize shoulder R shoulder IR YTB reactive isometric step-outs x 10 (neutral shoulder  with elbow flexed to 90) R shoulder ER YTB reactive isometric step-outs x 10 (neutral shoulder with elbow flexed to 90) - cues for scapular retraction activation to stabilize shoulder   11/23/2023 THERAPEUTIC EXERCISE: To improve strength, endurance, ROM, and flexibility.  Demonstration, verbal and tactile cues throughout for technique.  Pulleys: Flexion & Scaption x 3 min each  Standing R shoulder flexion/scaption wall slides x 10 - AAROM required with L hand at elbow Standing R shoulder flexion counter slides with 2# weight on washcloth 2 x 10 Standing R shoulder scaption counter slides with 2# weight on washcloth 2 x 10 Seated B shoulder orange Pball flexion & scaption walk-out x 10 each (attempted initially on wall but deferred d/t excessive R shoulder shrugging  NEUROMUSCULAR RE-EDUCATION: To improve coordination, kinesthesia, and posture. R shoulder flexion YTB reactive isometric step-outs x 5 (neutral shoulder with elbow flexed to 90) R shoulder extension YTB reactive isometric step-outs x 5 (neutral shoulder with elbow flexed to 90) - cues for scapular retraction activation to stabilize shoulder R shoulder IR YTB reactive isometric step-outs x 5 (neutral shoulder with elbow flexed to 90) R shoulder ER YTB reactive  isometric step-outs x 5 (neutral shoulder with elbow flexed to 90) - cues for scapular retraction activation to stabilize shoulder   11/21/2023  THERAPEUTIC EXERCISE: To improve strength, endurance, ROM, and flexibility.  Demonstration, verbal and tactile cues throughout for technique. Pulleys: Flexion & Scaption x 3 min each  Supine for gentle R shoulder PROM/stretching all planes Supine flexed elbow R shoulder flexion/scaption (uppercut) AA/AROM 2 x 10 - somewhat better tolerance for AROM but still slight guiding from PT initially with motion  MANUAL THERAPY: To promote normalized muscle tension, improved flexibility, improved joint mobility, and reduced pain utilizing connective tissue massage, therapeutic massage, manual TP therapy, and scar mobilization.  STM/XFM to R shoulder surgical incision Supine gentle R shoulder oscillation and grade I-II inferior glide for pain relief/muscle relxation  NEUROMUSCULAR RE-EDUCATION: To improve coordination, kinesthesia, and posture. S/L R shoulder ER maintaining mini-pillow under axilla and R elbow at 90 degrees 2 x 10 - VC & TC for scapular engagement, avoiding shoulder shrug S/L R shoulder flexion - initially attempted with elbow straight but transitioned to R elbow at 90 degrees x 10  SELF CARE:  Provided education on sleeping position and use of pillow(s) to support R arm.    11/17/2023  THERAPEUTIC EXERCISE: To improve ROM and flexibility.  Demonstration, verbal and tactile cues throughout for technique.  Pulleys: Flexion & Scaption x 3 min each  Supine for gentle R shoulder PROM/stretching all planes Supine flexed elbow R shoulder flexion/scaption (uppercut) AA/AROM 2 x 10 - AAROM d/t increased discomfort with AROM  MANUAL THERAPY: To promote normalized muscle tension, improved flexibility, improved joint mobility, and reduced pain utilizing connective tissue massage, therapeutic massage, manual TP therapy, and scar mobilization.   STM/XFM to R shoulder surgical incision STM/DTM and manual TPR to R pecs  NEUROMUSCULAR RE-EDUCATION: To improve coordination, kinesthesia, and posture. S/L R shoulder ER maintaining towel roll under axilla and R elbow at 90 degrees 2 x 10 - VC & TC for scapular engagement S/L R shoulder flexion - initially attempted with elbow straight but transitioned to R elbow at 90 degrees x 10 Seated YTB scap retraction + B shoulder row x 10 Seated YTB scap retraction + B shoulder extension stopping shy of neutral x 10   11/14/23: Pulleys 3 min flexion B Supine for gentle stretching R shoulder all  planes Supine with R humerus supported in line with trunk for manually resisted R shoulder isometric inv/ev mass practice Side lying L for R shoulder ER maintaining towel roll under axilla and R elbow at 90 degrees Standing with R elbow supported on counter, pulling towel with 6# on towel from flexed position to neutral, then advance to sitting in chair for yellow t band rows, needed frequent cues to avoid R shoulder extension past trunk, had pt slide back in chair so that the seat back blocked her from extending R shoulder    11/08/2023  THERAPEUTIC EXERCISE: To improve ROM and flexibility.  Demonstration, verbal and tactile cues throughout for technique.  Pulleys: Flexion x 3 min, Scaption x 3 min (scaption better tolerated today) Standing counter wash AAROM LUE: Flexion x 20 Scaption x 20 R shoulder PROM within pain tolerance for flexion, scaption, IR and ER Measured PROM Shoulder isometrics: flex, abd, ER, ext 5x5 Standing YTB scap retraction + B shoulder row- clicking clicking at first but after readjusting no clicking  Standing YTB scap retraction + B shoulder extension stopping shy of neutral x 10   11/06/2023  THERAPEUTIC EXERCISE: To improve ROM and flexibility.  Demonstration, verbal and tactile cues throughout for technique.  Pulleys: Flexion x 3 min, Scaption x 3 min (scaption better  tolerated today) Seated R shoulder AAROM with Swiffer: Flexion 2 x 10 Scaption 2 x 10 R shoulder PROM by PT within pain tolerance for flexion, scaption, IR and ER  Hooklying R shoulder AAROM with cane: Flexion 2 x 10 Scaption x 10 - pt needing PT guidance/assistance for proper movement pattern  NEUROMUSCULAR RE-EDUCATION: To improve coordination, kinesthesia, posture, and proprioception. R shoulder rhythmic stabilization at 90 flexion (1-finger perturbations) 2 x 15-20 sec R shoulder protraction AAROM with wand 2 x 10 Standing YTB scap retraction + B shoulder row x 10 Standing YTB scap retraction + B shoulder extension stopping shy of neutral x 10   11/02/2023  THERAPEUTIC EXERCISE: To improve strength, endurance, ROM, and flexibility.  Demonstration, verbal and tactile cues throughout for technique.  Pulleys: Flexion x 3 min, Scaption x 1.5 min (discontinued d/t increasing discomfort with scaption) Table slides standing at counter top:  Flexion 2 x 10 Scaption 2 x 10 Seated R shoulder AAROM with Swiffer: Flexion 2 x 10 Scaption 2 x 10 R shoulder PROM by PT within pain tolerance for flexion, scaption, IR and ER (ER limited to ~45 per protocol) Standing R shoulder submax (~25% effort) isometrics into wall 5 x 5 - flexion, abduction, extension, IR & ER Standing scapular retraction 10 x 5  SELF CARE:  Yellow Theraputty provided for home grip strengthening   10/30/2023 SELF CARE:  Reviewed eval findings and role of PT in addressing identified deficits as well as instruction in initial HEP (see below).    PATIENT EDUCATION:  Education details: HEP review and cautioned patient to adjust reps/set to avoid increased pain and pushing past point of fatigue/loss of control  Person educated: Patient Education method: Explanation, Demonstration, Verbal cues, and Tactile cues Education comprehension: verbalized understanding, returned demonstration, verbal cues required, tactile cues  required, and needs further education  HOME EXERCISE PROGRAM: Access Code: [email or web address] URL: [email or web address] Date: 11/30/2023 Prepared by: Trany Chernick  Exercises - Sidelying Shoulder External Rotation  - 1 x daily - 3 x weekly - 3 sets - 10 reps - Sidelying Shoulder Flexion 15 Degrees  - 1 x daily - 3 x weekly - 2 sets - 10 reps -  3 sec hold - Seated Shoulder Extension and Scapular Retraction with Resistance  - 1 x daily - 3 x weekly - 2 sets - 10 reps - 3-5 sec hold - Seated Shoulder Row with Anchored Resistance  - 1 x daily - 3 x weekly - 2 sets - 10 reps - 3-5 hold hold - Shoulder Scar Massage  - 1-2 x daily - 7 x weekly - 1-2 min hold - Supine Shoulder Protraction with Dowel  - 1 x daily - 7 x weekly - 3 sets - 10 reps - Supine Shoulder Press with Dowel  - 1 x daily - 7 x weekly - 3 sets - 10 reps - Supine Shoulder External Rotation with Resistance  - 1 x daily - 7 x weekly - 3 sets - 10 reps   ASSESSMENT:  CLINICAL IMPRESSION: Ghadeer reports she has been resting as instructed and her pain continues to be improved.  Today we advanced to very small amplitude stabilization ex for R shoulder, with emphasis on maintaining depressed position with R shoulder, she was able to control and tolerate well.  [place] will benefit from continued skilled PT to address ongoing ROM and strength deficits to improve functional UE use and activity tolerance with decreased pain interference.   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 Pt reports that she has orders to limit R arm use to waist level only, avoid reaching overhead at this time. She is having difficulty with forward and overhead reaching for items, opening cans w/ R hand, washing hair, and driving. She states that she has learned ways to compensate using her left arm even though she is R  hand dominant. Today's assessment was done with all items remaining within patient's tolerance. PROM revealed limitations in flexion, IR, ER, and abd as movement was limited by both pain and muscular guarding. Pt had difficulty fully relaxing the R arm for PROM today and displayed hesitancy to movement beyond waist side. Pt was educated on the importance of taking recovery slow at this point (as advised) and avoid heavy lifting and resistance training after asking to try bicep curls with a small weight at home. Pt is aware that at this stage, it is important to focus of restoring as much ROM as she can and gradually progressing towards more strengthening activities with therapy. Pt scored a 79.5% on the QuickDASH representing severe disability of the UE in day to day activities.  [place] will benefit from skilled PT intervention to address current deficits to improve functional ability, mobility, and activity tolerance w/ dec'd pain interference.   OBJECTIVE IMPAIRMENTS: decreased activity tolerance, decreased endurance, decreased knowledge of condition, decreased mobility, decreased ROM, decreased strength, increased fascial restrictions, impaired perceived functional ability, increased muscle spasms, impaired UE functional use, postural dysfunction, and pain.   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   REHAB POTENTIAL: Good  CLINICAL DECISION MAKING: Evolving/moderate complexity  EVALUATION  COMPLEXITY: Moderate  GOALS: Goals reviewed with  patient? Yes  SHORT TERM GOALS: Target date: 12/11/2023    Patient will be independent with initial HEP.  Baseline: Goal status: MET - 11/16/23  2.  Patient will report at least 25% improvement in R shoulder pain to improve QoL. Baseline: Worst 5-7/10 11/17/23 - pt noting shoulder more irritable over past week Goal status: IN PROGRESS - 12/12/23 -  R shoulder more irritable (3-4/10)  3.  Patient will improve R shoulder flexion PROM to 90 or better to aid in difficulty with forward reaching activities.  Baseline: limited by pain ~60 deg of flexion  Goal status: MET - 11/08/23 - no pain  4.  Patient will decrease her QuickDASH score by at least 15% to decrease severity of disability.  Baseline: 79.5% Goal status:  MET - 11/30/23 - QuickDASH Score: 45.5 / 100 = 45.5 %  LONG TERM GOALS: Target date: 01/22/2024  Patient will be independent with advanced HEP.  Baseline:  Goal status: IN PROGRESS - 12/26/23 - cautioned pt to ease back into shoulder pulleys & scapular exercises with HEP, avoiding increased pain and being aware of fatigue and adjusting reps/sets accordingly  2.  Patient will decrease her QuickDASH score to by at least 25-30% to improve function and activity tolerance d/t disability. Baseline: 79.5% Goal status: IN PROGRESS - 11/30/23 - QuickDASH Score: 45.5 / 100 = 45.5 %  3.  Patient will be able to perform overhead activities such as washing hair, retrieving items from cabinets to improve independent function at home. Baseline: pt is having difficulty with above activities  Goal status: IN PROGRESS - 11/30/23: progressing not met  4.  Patient will report at least 50% improvement in R shoulder pain to improve QoL.  Baseline: 5-7/10 11/30/23:  0 at rest, up to 3 with use , met so far Goal status: IN PROGRESS - 12/14/23 - pain has been increased over past few visits (3-4/10 today)  5.  Patient will obtain at least 3+/5 MMT of UE  for available range to increase activity tolerance.   Baseline: Unable to assess Goal status: IN PROGRESS - 11/30/23: progressing   PLAN: PT FREQUENCY: 2x/week  PT DURATION: 12 weeks  PLANNED INTERVENTIONS: 97164- PT Re-evaluation, 97750- Physical Performance Testing, 97110-Therapeutic exercises, 97530- Therapeutic activity, [id number]- Neuromuscular re-education, 97535- Self Care, [id number]- Manual therapy, [id number]- Gait training, [phone]- Electrical stimulation (unattended), [id number]- Ultrasound, 02987- Traction (mechanical), 20560 (1-2 muscles), 20561 (3+ muscles)- Dry Needling, Patient/Family education, Taping, Joint mobilization, Cryotherapy, and Moist heat  PLAN FOR NEXT SESSION: Resume gentle R shoulder P/AA/AROM within pain free motion; review/revise HEP as indicated; may use Reverse Total Shoulder Arthroplasty for Fracture Protocol for guidance (Sx 09/07/23) - post-op week #16 as of 12/28/23   Landan Fedie L Noa Galvao, PT, DPT, OCS 12/28/2023, 1:02 PM

## 2024-01-02 ENCOUNTER — Ambulatory Visit: Admitting: Physical Therapy

## 2024-01-02 ENCOUNTER — Encounter: Payer: Self-pay | Admitting: Physical Therapy

## 2024-01-02 DIAGNOSIS — R252 Cramp and spasm: Secondary | ICD-10-CM

## 2024-01-02 DIAGNOSIS — M25511 Pain in right shoulder: Secondary | ICD-10-CM

## 2024-01-02 DIAGNOSIS — M25611 Stiffness of right shoulder, not elsewhere classified: Secondary | ICD-10-CM

## 2024-01-02 DIAGNOSIS — M6281 Muscle weakness (generalized): Secondary | ICD-10-CM

## 2024-01-02 DIAGNOSIS — G8929 Other chronic pain: Secondary | ICD-10-CM | POA: Diagnosis not present

## 2024-01-02 DIAGNOSIS — S46211A Strain of muscle, fascia and tendon of other parts of biceps, right arm, initial encounter: Secondary | ICD-10-CM | POA: Diagnosis not present

## 2024-01-02 NOTE — Therapy (Signed)
 OUTPATIENT PHYSICAL THERAPY TREATMENT    Patient Name: Faith Massey MRN: [id number] DOB:1946-01-16, 78 y.o., female Today's Date: 01/02/2024  END OF SESSION:  PT End of Session - 01/02/24 1152     Visit Number 17    Date for Recertification  01/22/24    Authorization Type [employer]    Progress Note Due on Visit 20    PT Start Time 1152    PT Stop Time 1240    PT Time Calculation (min) 48 min    Activity Tolerance Patient tolerated treatment well;Patient limited by fatigue    Behavior During Therapy [facility] for tasks assessed/performed            Past Medical History:  Diagnosis Date   Arthritis    Breast CA (HCC)    Chest mass 03/28/2022   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 10/16/2012   Pneumonia    Pre-diabetes    Prediabetes 05/25/2022   Pyelonephritis 11/10/2022   Right knee injury 02/14/2017   S/P radiation therapy 05/09/2023   S/P Robotic Assisted Right Video Thoracoscopy with resection of Thymus 04/25/2022   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   Past Surgical History:  Procedure Laterality Date   ABDOMINAL HYSTERECTOMY  03/22/2003   BACK SURGERY  03/21/1998   BREAST SURGERY     reconstruction   colon susrgery      CYSTOSCOPY WITH STENT PLACEMENT N/A 02/08/2023   Procedure: POSSIBLE CYSTOSCOPY WITH URETERAL STENT PLACEMENT;  Surgeon: Alvaro Ricardo KATHEE Mickey., MD;   Location: [facility];  Service: Urology;  Laterality: N/A;   LEFT HEART CATH AND CORONARY ANGIOGRAPHY N/A 07/20/2023   Procedure: LEFT HEART CATH AND CORONARY ANGIOGRAPHY;  Surgeon: Verlin Lonni BIRCH, MD;  Location: [facility];  Service: Cardiovascular;  Laterality: N/A;   MASTECTOMY Bilateral 03/21/1993   RESECTION OF A THYMOMA     04-25-22   REVISION TOTAL SHOULDER TO REVERSE TOTAL SHOULDER Right 09/07/2023   Procedure: REVISION, REVERSE TOTAL ARTHROPLASTY, SHOULDER;  Surgeon: Melita Drivers, MD;  Location: [facility];  Service: Orthopedics;  Laterality: Right;  HEMI ARTHROPLASTY, BONEGRAFT GLENOID   right total shoulder     Patient Active Problem List   Diagnosis Date Noted   S/P shoulder hemiarthroplasty, 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   Abnormal EKG 07/21/2023   Chest pain 07/20/2023   Preoperative cardiovascular examination 07/19/2023   Arthritis    Breast CA (HCC)    Hyperlipidemia    Laceration of muscle, fascia and tendon of long head of biceps, unspecified arm, initial encounter    Myasthenia gravis (HCC)    Pneumonia    S/P radiation therapy 05/09/2023   Hoarseness of voice 05/09/2023   Diverticular disease 02/08/2023   History of pulmonary embolism 12/22/2022   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   Pyelonephritis 11/10/2022   Prediabetes 05/25/2022   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  S/P Robotic Assisted Right Video Thoracoscopy with resection of Thymus 04/25/2022   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   Myasthenic syndrome (HCC) 04/12/2022   CVA (cerebral vascular accident) (HCC) 03/28/2022   HTN (hypertension) 03/28/2022   Chest mass 03/28/2022   Right knee injury 02/14/2017   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 10/16/2012   Osteoporosis 10/12/2012    PCP: Watt Harlene BROCKS, MD   REFERRING PROVIDER: Melita Drivers, MD   REFERRING DIAG: [phone] (ICD-10-CM) - Presence of right artificial shoulder joint   THERAPY DIAG:  Acute pain of right shoulder  Stiffness of  right shoulder, not elsewhere classified  Muscle weakness (generalized)  Cramp and spasm  RATIONALE FOR EVALUATION AND TREATMENT: Rehabilitation  ONSET DATE: Post-Op R TSA revision/conversion to hemi-arthroplasty on 09/07/2023   NEXT MD VISIT:  01/10/24 (~6 wks from 11/29/23)   SUBJECTIVE:                                                                                                                                                                                      SUBJECTIVE STATEMENT:  Still feeling good but did have a rough night a few days ago - better today.  She has started back with pulleys but has been very careful not to overdo things.   Hand dominance: Right  PERTINENT HISTORY: MG, h/o radiation (March and May 2024) for mediastinal mass, Breast CA, HTN, OP, R TSA (09/07/23)  PAIN:  Are you having pain? Yes: NPRS scale: 0-1/10 Pain location: R anterior upper shoulder, scapular region upon waking in the morning  Pain description: mostly aching  Aggravating factors: reaching for the steering wheel  Relieving factors: Ice, medication   PRECAUTIONS:  Shoulder: physical therapist referral - Evaluate and treat per protocol status post revision right reverse total shoulder arthroplasty to CTA hemiarthroplasty, DOS 09/07/23 Patient unfortunately had a fracture at the base of the glenosphere making it unstable and requiring removal and conversion to hemiarthroplasty. Please adjust her physical therapy to be done under a very slow and steady and gradual advance of range of motion and gradual use of the right upper extremity within her pain tolerance. Please call me for any additional questions  RED FLAGS: None   WEIGHT BEARING RESTRICTIONS: No  FALLS:  Has patient fallen in last 6 months? Yes. Number of falls a week and a half ago, feel onto buttock while pulling weeds out of yard   LIVING ENVIRONMENT: Lives with: lives alone Lives in: House/apartment Stairs: No Has  following equipment at home: Single point cane, shower chair, Grab bars, and walking stick   OCCUPATION: Retired   PLOF: Independent with household mobility with device  and Leisure: watching tv, reading, walking her dog, social gatherings w/ friends to play card games   PATIENT GOALS: Get my shoulder to functioning, lifting shoulder, bearing weight   OBJECTIVE:  Note: Objective measures were completed at Evaluation unless otherwise noted.  DIAGNOSTIC FINDINGS:  09/20/2022: R shoulder XR  Results: Degenerative changes of the before meals and glenohumeral joints. No other acute findings.   PATIENT SURVEYS :  QuickDASH Score: 79.5 / 100 = 79.5 %  COGNITION: Overall cognitive status: Within functional limits for tasks assessed     SENSATION: WFL  POSTURE: Rounded shoulders, fwd head   UPPER EXTREMITY ROM:  LUE: Grossly WFL   Passive ROM Right eval R 11/02/23 R 11/08/23 R 11/27/23  R 11/30/23   Shoulder flexion Limited, p! Less than 90 (around 60) 84 90 120 120  Shoulder extension       Shoulder abduction Limited less than 90 74 scaption 80 scaption 100 100  Shoulder adduction       Shoulder internal rotation Limited p! 35 at 30 scaption     Shoulder external rotation Limitied p! 45 at 30 scaption 50  64 73  (Blank rows = not tested)  Active ROM R 11/30/23  Shoulder flexion 56  Shoulder extension   Shoulder abduction   Shoulder adduction   Shoulder extension   Shoulder internal rotation   Shoulder external rotation    (Blank rows = not tested)   UPPER EXTREMITY MMT:  MMT Right eval Left eval R 11/30/23  Shoulder flexion  4- 2  Shoulder extension     Shoulder abduction  4+   Shoulder adduction     Shoulder internal rotation  4-   Shoulder external rotation  4- 3-  Middle trapezius     Lower trapezius     Elbow flexion  5   Elbow extension  4+   Wrist flexion     Wrist extension     Wrist ulnar deviation     Wrist radial deviation     Wrist pronation      Wrist supination     Grip strength (lbs)     (Blank rows = not tested)  11/30/23:  R shoulder flexion active to 56 degrees R shoulder ER 3-/5  SHOULDER SPECIAL TESTS: Not Performed   JOINT MOBILITY TESTING:  Not Performed   PALPATION:  TTP: R ant glenoid, collar bone area (tender, not painful)                                                                                                                              TREATMENT DATE:   12/26/2023 THERAPEUTIC EXERCISE: To improve strength, endurance, ROM, and flexibility.  Demonstration, verbal and tactile cues throughout for technique.  Seated pulleys: Flexion & Scaption x 3 min each - emphasizing submax effort at ~25% effort with eccentric lowering  S/L R shoulder ER AROM x 5, lightly resisted AROM with 3 oz putty tub to  onset of fatigue (~7 reps) S/L R shoulder flexion AA/AROM to onset of fatigue (~8 reps) but gradually able to increase flexion ROM S/L R shoulder scaption AROM to ~90 - reps to onset of fatigue - cues for shoulder depression, avoiding shoulder hike  THERAPEUTIC ACTIVITIES: To improve functional performance.  Demonstration, verbal and tactile cues throughout for technique.  Functional reaches R shoulder flexion with hand to 9 yoga block lifting 3 oz putty tub - performed to onset of fatigue/increased substitution (shoulder hike) Functional reaches R shoulder scaption with hand to 9 yoga block lifting 3 oz putty tub - performed to onset of fatigue (feeling of clunking in shoulder)  NEUROMUSCULAR RE-EDUCATION: To improve coordination, kinesthesia, posture, and proprioception. Supine R shoulder rhythmic stabilization IR/ER with shoulder 30 degrees from trunk and elbow at 90 degrees on towel roll Supine R scapular punches with PT supporting arm lightly  Supine R shoulder CW/CCW circles x 10 each with PT supporting arm lightly Standing YTB scap retraction/depression + B shoulder extension stopping shy of neutral x  10 Standing YTB scap retraction/depression + B shoulder row x 10 Standing scapular press-ups leaning lightly on elevated hi/low table with elbows extended        12/28/23:  Seated for shoulder pulley, PROM on R, flex and scaption 2-3 min each Supine for retrograde massage, and gentle AAROM  Supine for various rhythmic stabilization ex: With towel roll under R elbow for shoulder IR/ER elbow 90 degrees, shoulder 30 degrees from trunk Scapular punches with therapist supporting arm lightly in supine PNF sword pulls from L ant hip to R shoulder scaption position, therapist providing manual support and resistance.   Seated for playground ball squeeze, engaging B shoulder depression and small forward shoulder flexion Seated for manually assisted R UE horizontal adduction/ abduction , gentle, small range Seated rows with yellow theraband, small movements, pt able to maintain B shoulder depression with the rows Seated R triceps extension therapist providing manual resistance   12/26/2023 THERAPEUTIC EXERCISE: To improve strength, endurance, ROM, and flexibility.  Demonstration, verbal and tactile cues throughout for technique.  Pulleys: Flexion & Scaption x 3 min each - emphasizing submax effort at ~25% effort with eccentric lowering  Gentle R UT and LS stretches x 30 each  MANUAL THERAPY: To promote normalized muscle tension, improved flexibility, improved joint mobility, increased ROM, and pain modulation utilizing connective tissue massage, therapeutic massage, and manual TP therapy.  STM/DTM and manual TPR to R UT and LS  THERAPEUTIC ACTIVITIES: To improve functional performance.  Demonstration, verbal and tactile cues throughout for technique.  R shoulder AAROM wall slides with slight support from L hand - cues to avoid shoulder shrug - fatigues quickly Functional reaches R shoulder flexion with hand to 14 box placed on counter - increased shoulder shrug observed, therefore reduced height to  9 stool on counter and performed in flexion and scaption to point of fatigue/increased substitution  NEUROMUSCULAR RE-EDUCATION: To improve coordination, kinesthesia, posture, proprioception, and amplitude of movement.  PT providing tactile cues with fingertips on medial border of scapula. Scap retraction + small ROM B shoulder row (emphasis on scapular movement/activation) 2 x 5 Scap retraction + small ROM B shoulder extension +emphasis on scapular movement/activation) 2 x 5   12/21/23  Therapeutic activity: UBE 3 min F, attempted backward but unable to avoid shrugging R shoulder so discontinued Supine for gentle PROM all planes R shoulder  Supine with towel roll under humerus, yellow t band ER L with static positioning R  shoulder, with B humerus against trunk, mass practice Side lying L for R shoulder ER with towel under axilla, mass practice, really cued verbally and manually for R shoulder depression to avoid impinging ant/superior GH jt Side lying L for R shoulder flexion, therapist assisted, manually depressed R scapula to achieve better movement pattern Standing for scaption , pt grasping sink and leaning posteriorly to distract R shoulder and flexing trunk to achieve gentle stretch into scaption Standing L forearm on counter for R shoulder extension, again emphasis on avoiding shrugging, engaging proper scapular retraction while moving R shoulder, advised pt to trial at home some this week.   12/14/2023 THERAPEUTIC EXERCISE: To improve ROM and flexibility.  Demonstration, verbal and tactile cues throughout for technique.  Pulleys: Flexion & Scaption x 3 min each - emphasizing submax effort at ~25% effort with eccentric lowering  Supine R should er flexion PROM with PT inhibiting UT/shoulder shrug and cues to minimize muscle activation/guarding - pain increased when pt attempting AAROM Supine R shoulder scaption P/AAROM - better tolerance today Supine R shoulder IR/ER P/AAROM in scapular  plane S/L R shoulder flexion PROM progressing to AAROM within pain free ROM  MANUAL THERAPY: To promote normalized muscle tension, improved flexibility, improved joint mobility, increased ROM, pain modulation, and reduced pain utilizing joint mobilization, connective tissue massage, therapeutic massage, manual TP therapy, and myofascial release. Supine gentle R shoulder oscillation and grade I-II inferior glide for pain relief/muscle relxation R shoulder incisional scar massage Supine and S/L STM/DTM and gentle manual TPR to R infraspinatus and teres group  L S/L R scapular mobilization  MODALITIES:  TENS unit to R shoulder complex - intensity to pt tolerance x 20 min + moist heat pack for pain relief and decreased muscle guarding  SELF CARE: Provided education on home TENS unit set-up and use and continued limited active R UE use until pain better controlled.    12/12/2023  THERAPEUTIC EXERCISE: To improve ROM and flexibility.  Demonstration, verbal and tactile cues throughout for technique.  Pulleys: Flexion & Scaption x 3 min each - reviewed submax effort at ~25% effort with eccentric lowering  Supine R shoulder flexion and scaption P/AAROM with PT inhibiting UT/shoulder shrug   MANUAL THERAPY: To promote normalized muscle tension, improved flexibility, improved joint mobility, increased ROM, and pain modulation utilizing joint mobilization, connective tissue massage, therapeutic massage, and manual TP therapy.  Supine gentle R shoulder oscillation and grade I-II inferior glide for pain relief/muscle relxation Supine with towel roll supporting R post humerus, for gentle ROM R shoulder, with intermittent retrograde massage, STM/DTM and manual TPR to R UT, LS, pecs, subscapularis, teres group and infrapinatus  L S/L R scapular mobilization  MODALITIES:  TENS unit to R shoulder complex - intensity to pt tolerance x 20 min + moist heat pack for pain relief and decreased muscle  guarding  SELF CARE: Provided education on pain management options.  Provided education on home TENS unit options including proper set up, precautions and recommended home frequency for use.   12/07/23:  Shoulder pulley flex, scaption, 2 min each.  The patient has been eccentrically lowering her R shoulder with the pulleys, advised her to reduce her engagement of musculature with eccentric lowering to 25% vs a maximal contraction that she has been utilizing. Supine with towel roll supporting R post humerus, for gentle ROM R shoulder, with intermittent retrograde massage.  Assisted R scapular/serratus punches 10x Supine chest press with yard stick 10x Supine manually resisted isometrics for R  shoulder ER 10 reps, 5 sec holds Side lying L for R shoulder isometric adduction, towel roll in axilla,  5 sec holds, 10 reps  Side lying L for R shoulder ER 10 reps , pt reported soreness so stopped Side lying for assisted R shoulder flexion 10 reps, therapist supported under R hand    11/30/23:  Reassessed for 10th visit progress report Progressed therex according to pts tolerance and to protocol, now 12 weeks post op Shoulder pulley x 3 min flexion and scaption, slow Supine for serratus punches, initially with therapist supporting R arm and then with yard stick Supine for chest presses with yard stick Supine L shoulder ER with yellow theraband, stabilizing, static position R shoulder Side lying L for R shoulder ER Side lying L for R shoulder flexion Updated HEP below   11/27/2023 THERAPEUTIC EXERCISE: To improve strength, endurance, ROM, and flexibility.  Demonstration, verbal and tactile cues throughout for technique.  Pulleys: Flexion & Scaption x 3 min each  Standing R shoulder flexion/scaption wall slides x 5 - AAROM required with L hand at elbow- limited today Standing R shoulder flexion counter slides with 2# weight on washcloth 2 x 10 Standing R shoulder scaption counter slides with 2#  weight on washcloth 2 x 10 Seated hand clasped AAROM shoulder flexion x 5  NEUROMUSCULAR RE-EDUCATION: To improve coordination, kinesthesia, and posture. R shoulder flexion YTB reactive isometric step-outs x 10 (neutral shoulder with elbow flexed to 90) R shoulder extension YTB reactive isometric step-outs x 10 (neutral shoulder with elbow flexed to 90) - cues for scapular retraction activation to stabilize shoulder R shoulder IR YTB reactive isometric step-outs x 10 (neutral shoulder with elbow flexed to 90) R shoulder ER YTB reactive isometric step-outs x 10 (neutral shoulder with elbow flexed to 90) - cues for scapular retraction activation to stabilize shoulder   11/23/2023 THERAPEUTIC EXERCISE: To improve strength, endurance, ROM, and flexibility.  Demonstration, verbal and tactile cues throughout for technique.  Pulleys: Flexion & Scaption x 3 min each  Standing R shoulder flexion/scaption wall slides x 10 - AAROM required with L hand at elbow Standing R shoulder flexion counter slides with 2# weight on washcloth 2 x 10 Standing R shoulder scaption counter slides with 2# weight on washcloth 2 x 10 Seated B shoulder orange Pball flexion & scaption walk-out x 10 each (attempted initially on wall but deferred d/t excessive R shoulder shrugging  NEUROMUSCULAR RE-EDUCATION: To improve coordination, kinesthesia, and posture. R shoulder flexion YTB reactive isometric step-outs x 5 (neutral shoulder with elbow flexed to 90) R shoulder extension YTB reactive isometric step-outs x 5 (neutral shoulder with elbow flexed to 90) - cues for scapular retraction activation to stabilize shoulder R shoulder IR YTB reactive isometric step-outs x 5 (neutral shoulder with elbow flexed to 90) R shoulder ER YTB reactive isometric step-outs x 5 (neutral shoulder with elbow flexed to 90) - cues for scapular retraction activation to stabilize shoulder   11/21/2023  THERAPEUTIC EXERCISE: To improve strength,  endurance, ROM, and flexibility.  Demonstration, verbal and tactile cues throughout for technique. Pulleys: Flexion & Scaption x 3 min each  Supine for gentle R shoulder PROM/stretching all planes Supine flexed elbow R shoulder flexion/scaption (uppercut) AA/AROM 2 x 10 - somewhat better tolerance for AROM but still slight guiding from PT initially with motion  MANUAL THERAPY: To promote normalized muscle tension, improved flexibility, improved joint mobility, and reduced pain utilizing connective tissue massage, therapeutic massage, manual TP therapy, and scar mobilization.  STM/XFM to R shoulder surgical incision Supine gentle R shoulder oscillation and grade I-II inferior glide for pain relief/muscle relxation  NEUROMUSCULAR RE-EDUCATION: To improve coordination, kinesthesia, and posture. S/L R shoulder ER maintaining mini-pillow under axilla and R elbow at 90 degrees 2 x 10 - VC & TC for scapular engagement, avoiding shoulder shrug S/L R shoulder flexion - initially attempted with elbow straight but transitioned to R elbow at 90 degrees x 10  SELF CARE:  Provided education on sleeping position and use of pillow(s) to support R arm.    11/17/2023  THERAPEUTIC EXERCISE: To improve ROM and flexibility.  Demonstration, verbal and tactile cues throughout for technique.  Pulleys: Flexion & Scaption x 3 min each  Supine for gentle R shoulder PROM/stretching all planes Supine flexed elbow R shoulder flexion/scaption (uppercut) AA/AROM 2 x 10 - AAROM d/t increased discomfort with AROM  MANUAL THERAPY: To promote normalized muscle tension, improved flexibility, improved joint mobility, and reduced pain utilizing connective tissue massage, therapeutic massage, manual TP therapy, and scar mobilization.  STM/XFM to R shoulder surgical incision STM/DTM and manual TPR to R pecs  NEUROMUSCULAR RE-EDUCATION: To improve coordination, kinesthesia, and posture. S/L R shoulder ER maintaining towel roll  under axilla and R elbow at 90 degrees 2 x 10 - VC & TC for scapular engagement S/L R shoulder flexion - initially attempted with elbow straight but transitioned to R elbow at 90 degrees x 10 Seated YTB scap retraction + B shoulder row x 10 Seated YTB scap retraction + B shoulder extension stopping shy of neutral x 10   11/14/23: Pulleys 3 min flexion B Supine for gentle stretching R shoulder all planes Supine with R humerus supported in line with trunk for manually resisted R shoulder isometric inv/ev mass practice Side lying L for R shoulder ER maintaining towel roll under axilla and R elbow at 90 degrees Standing with R elbow supported on counter, pulling towel with 6# on towel from flexed position to neutral, then advance to sitting in chair for yellow t band rows, needed frequent cues to avoid R shoulder extension past trunk, had pt slide back in chair so that the seat back blocked her from extending R shoulder    11/08/2023  THERAPEUTIC EXERCISE: To improve ROM and flexibility.  Demonstration, verbal and tactile cues throughout for technique.  Pulleys: Flexion x 3 min, Scaption x 3 min (scaption better tolerated today) Standing counter wash AAROM LUE: Flexion x 20 Scaption x 20 R shoulder PROM within pain tolerance for flexion, scaption, IR and ER Measured PROM Shoulder isometrics: flex, abd, ER, ext 5x5 Standing YTB scap retraction + B shoulder row- clicking clicking at first but after readjusting no clicking  Standing YTB scap retraction + B shoulder extension stopping shy of neutral x 10   11/06/2023  THERAPEUTIC EXERCISE: To improve ROM and flexibility.  Demonstration, verbal and tactile cues throughout for technique.  Pulleys: Flexion x 3 min, Scaption x 3 min (scaption better tolerated today) Seated R shoulder AAROM with Swiffer: Flexion 2 x 10 Scaption 2 x 10 R shoulder PROM by PT within pain tolerance for flexion, scaption, IR and ER  Hooklying R shoulder AAROM with  cane: Flexion 2 x 10 Scaption x 10 - pt needing PT guidance/assistance for proper movement pattern  NEUROMUSCULAR RE-EDUCATION: To improve coordination, kinesthesia, posture, and proprioception. R shoulder rhythmic stabilization at 90 flexion (1-finger perturbations) 2 x 15-20 sec R shoulder protraction AAROM with wand 2 x 10 Standing YTB scap retraction +  B shoulder row x 10 Standing YTB scap retraction + B shoulder extension stopping shy of neutral x 10   11/02/2023  THERAPEUTIC EXERCISE: To improve strength, endurance, ROM, and flexibility.  Demonstration, verbal and tactile cues throughout for technique.  Pulleys: Flexion x 3 min, Scaption x 1.5 min (discontinued d/t increasing discomfort with scaption) Table slides standing at counter top:  Flexion 2 x 10 Scaption 2 x 10 Seated R shoulder AAROM with Swiffer: Flexion 2 x 10 Scaption 2 x 10 R shoulder PROM by PT within pain tolerance for flexion, scaption, IR and ER (ER limited to ~45 per protocol) Standing R shoulder submax (~25% effort) isometrics into wall 5 x 5 - flexion, abduction, extension, IR & ER Standing scapular retraction 10 x 5  SELF CARE:  Yellow Theraputty provided for home grip strengthening   10/30/2023 SELF CARE:  Reviewed eval findings and role of PT in addressing identified deficits as well as instruction in initial HEP (see below).    PATIENT EDUCATION:  Education details: HEP review and cautioned patient to adjust reps/set to avoid increased pain and pushing past point of fatigue/loss of control  Person educated: Patient Education method: Explanation, Demonstration, Verbal cues, and Tactile cues Education comprehension: verbalized understanding, returned demonstration, verbal cues required, tactile cues required, and needs further education  HOME EXERCISE PROGRAM: Access Code: [email or web address] URL: [email or web address] Date: 11/30/2023 Prepared by: Greig Speaks  Exercises - Sidelying  Shoulder External Rotation  - 1 x daily - 3 x weekly - 3 sets - 10 reps - Sidelying Shoulder Flexion 15 Degrees  - 1 x daily - 3 x weekly - 2 sets - 10 reps - 3 sec hold - Seated Shoulder Extension and Scapular Retraction with Resistance  - 1 x daily - 3 x weekly - 2 sets - 10 reps - 3-5 sec hold - Seated Shoulder Row with Anchored Resistance  - 1 x daily - 3 x weekly - 2 sets - 10 reps - 3-5 hold hold - Shoulder Scar Massage  - 1-2 x daily - 7 x weekly - 1-2 min hold - Supine Shoulder Protraction with Dowel  - 1 x daily - 7 x weekly - 3 sets - 10 reps - Supine Shoulder Press with Dowel  - 1 x daily - 7 x weekly - 3 sets - 10 reps - Supine Shoulder External Rotation with Resistance  - 1 x daily - 7 x weekly - 3 sets - 10 reps   ASSESSMENT:  CLINICAL IMPRESSION: Faith Massey reports her pain has remained better controlled over the past 2 weeks other than 1 bad night over the weekend.  She has resumed the pulleys at home but has tried to be more aware of her movement patterns avoiding the shoulder shrug/substitution.  Therapeutic interventions continue to focus on awareness of proper glenohumeral movement patterns avoiding substitution while making sure not to push to point of increased pain or loss of control related to fatigue.  She continues to require significant hands on facilitation/guidance but is demonstrating improving movement patterns without increased pain.  Faith Massey will benefit from continued skilled PT to address ongoing R shoulder ROM, glenohumeral control and strength deficits to functional UE use and activity tolerance with decreased pain interference.   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 Pt reports that she has orders to limit R arm  use to waist level only, avoid reaching overhead at this time. She is having difficulty with forward and  overhead reaching for items, opening cans w/ R hand, washing hair, and driving. She states that she has learned ways to compensate using her left arm even though she is R hand dominant. Today's assessment was done with all items remaining within patient's tolerance. PROM revealed limitations in flexion, IR, ER, and abd as movement was limited by both pain and muscular guarding. Pt had difficulty fully relaxing the R arm for PROM today and displayed hesitancy to movement beyond waist side. Pt was educated on the importance of taking recovery slow at this point (as advised) and avoid heavy lifting and resistance training after asking to try bicep curls with a small weight at home. Pt is aware that at this stage, it is important to focus of restoring as much ROM as she can and gradually progressing towards more strengthening activities with therapy. Pt scored a 79.5% on the QuickDASH representing severe disability of the UE in day to day activities.  Faith Massey will benefit from skilled PT intervention to address current deficits to improve functional ability, mobility, and activity tolerance w/ dec'd pain interference.   OBJECTIVE IMPAIRMENTS: decreased activity tolerance, decreased endurance, decreased knowledge of condition, decreased mobility, decreased ROM, decreased strength, increased fascial restrictions, impaired perceived functional ability, increased muscle spasms, impaired UE functional use, postural dysfunction, and pain.   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   REHAB POTENTIAL: Good  CLINICAL DECISION MAKING: Evolving/moderate complexity  EVALUATION COMPLEXITY: Moderate  GOALS: Goals reviewed with patient? Yes  SHORT TERM GOALS: Target date: 12/11/2023    Patient will be independent with initial HEP.  Baseline: Goal status: MET - 11/16/23  2.  Patient will report at least 25% improvement in R shoulder pain to improve QoL. Baseline: Worst 5-7/10 11/17/23 - pt noting shoulder more irritable over past week 12/12/23 - R shoulder more irritable (3-4/10) Goal status: MET - 01/02/24 - pain better controlled with minimal to no pain over past 2 weeks  3.  Patient will improve R shoulder flexion PROM to 90 or better to aid in difficulty with forward reaching activities.  Baseline: limited by pain ~60 deg of flexion  Goal status: MET - 11/08/23 - no pain  4.  Patient will decrease her QuickDASH score by at least 15% to decrease severity of disability.  Baseline: 79.5% Goal status:  MET - 11/30/23 - QuickDASH Score: 45.5 / 100 = 45.5 %  LONG TERM GOALS: Target date: 01/22/2024  Patient will be independent with advanced HEP.  Baseline:  Goal status: IN PROGRESS - 12/26/23 - cautioned pt to ease back into shoulder pulleys & scapular exercises with HEP, avoiding increased pain and being aware of fatigue and adjusting reps/sets accordingly  2.  Patient will decrease her QuickDASH score to by at least 25-30% to improve function and activity tolerance d/t disability. Baseline: 79.5% Goal status: IN PROGRESS - 11/30/23 - QuickDASH Score: 45.5 / 100 = 45.5 %  3.  Patient will be able to perform overhead activities such as washing hair, retrieving  items from cabinets to improve independent function at home. Baseline: pt is having difficulty with above activities  Goal status: IN PROGRESS - 11/30/23: progressing not met  4.  Patient will report at least 50% improvement in R shoulder  pain to improve QoL.  Baseline: 5-7/10 11/30/23:  0 at rest, up to 3 with use , met so far 12/14/23 - pain has been increased over past few visits (3-4/10 today) Goal status: IN PROGRESS - 01/02/24 - pain better controlled with minimal to no pain over past 2 weeks  5.  Patient will obtain at least 3+/5 MMT of UE for available range to increase activity tolerance.   Baseline: Unable to assess Goal status: IN PROGRESS - 11/30/23: progressing   PLAN: PT FREQUENCY: 2x/week  PT DURATION: 12 weeks  PLANNED INTERVENTIONS: 02835- PT Re-evaluation, 97750- Physical Performance Testing, 97110-Therapeutic exercises, 97530- Therapeutic activity, [id number]- Neuromuscular re-education, 97535- Self Care, 02859- Manual therapy, [phone]- Gait training, [phone]- Electrical stimulation (unattended), 97035- Ultrasound, 02987- Traction (mechanical), 20560 (1-2 muscles), 20561 (3+ muscles)- Dry Needling, Patient/Family education, Taping, Joint mobilization, Cryotherapy, and Moist heat  PLAN FOR NEXT SESSION: Resume gentle R shoulder P/AA/AROM within pain free motion and watching closely for substitution related to fatigue; carefully review/revise HEP as indicated with only minimal changes at a time to avoid re-irritation of R shoulder; may use Reverse Total Shoulder Arthroplasty for Fracture Protocol for guidance (Sx 09/07/23) - post-op week #17 as of 01/04/24   Faith Massey CHRISTELLA Hidden, PT 01/02/2024, 12:49 PM

## 2024-01-04 ENCOUNTER — Ambulatory Visit

## 2024-01-04 DIAGNOSIS — M25511 Pain in right shoulder: Secondary | ICD-10-CM

## 2024-01-04 DIAGNOSIS — M25611 Stiffness of right shoulder, not elsewhere classified: Secondary | ICD-10-CM | POA: Diagnosis not present

## 2024-01-04 DIAGNOSIS — M6281 Muscle weakness (generalized): Secondary | ICD-10-CM

## 2024-01-04 DIAGNOSIS — G709 Myoneural disorder, unspecified: Secondary | ICD-10-CM | POA: Diagnosis not present

## 2024-01-04 DIAGNOSIS — R252 Cramp and spasm: Secondary | ICD-10-CM

## 2024-01-04 DIAGNOSIS — S46211A Strain of muscle, fascia and tendon of other parts of biceps, right arm, initial encounter: Secondary | ICD-10-CM | POA: Diagnosis not present

## 2024-01-04 DIAGNOSIS — G7001 Myasthenia gravis with (acute) exacerbation: Secondary | ICD-10-CM | POA: Diagnosis not present

## 2024-01-04 DIAGNOSIS — G8929 Other chronic pain: Secondary | ICD-10-CM | POA: Diagnosis not present

## 2024-01-04 NOTE — Therapy (Signed)
 OUTPATIENT PHYSICAL THERAPY TREATMENT    Patient Name: Faith Massey MRN: [id number] DOB:01/23/1946, 78 y.o., female Today's Date: 01/04/2024  END OF SESSION:  PT End of Session - 01/04/24 1235     Visit Number 18    Date for Recertification  01/22/24    Authorization Type [employer]    Progress Note Due on Visit 20    PT Start Time 1151    PT Stop Time 1232    PT Time Calculation (min) 41 min    Activity Tolerance Patient tolerated treatment well;Patient limited by fatigue    Behavior During Therapy [facility] for tasks assessed/performed             Past Medical History:  Diagnosis Date   Arthritis    Breast CA (HCC)    Chest mass 03/28/2022   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 10/16/2012   Pneumonia    Pre-diabetes    Prediabetes 05/25/2022   Pyelonephritis 11/10/2022   Right knee injury 02/14/2017   S/P radiation therapy 05/09/2023   S/P Robotic Assisted Right Video Thoracoscopy with resection of Thymus 04/25/2022   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   Past Surgical History:  Procedure Laterality Date   ABDOMINAL HYSTERECTOMY  03/22/2003   BACK SURGERY  03/21/1998   BREAST SURGERY     reconstruction   colon susrgery      CYSTOSCOPY WITH STENT PLACEMENT N/A 02/08/2023   Procedure: POSSIBLE CYSTOSCOPY WITH URETERAL STENT PLACEMENT;  Surgeon: Alvaro Ricardo KATHEE Mickey.,  MD;  Location: [facility];  Service: Urology;  Laterality: N/A;   LEFT HEART CATH AND CORONARY ANGIOGRAPHY N/A 07/20/2023   Procedure: LEFT HEART CATH AND CORONARY ANGIOGRAPHY;  Surgeon: Verlin Lonni BIRCH, MD;  Location: [facility];  Service: Cardiovascular;  Laterality: N/A;   MASTECTOMY Bilateral 03/21/1993   RESECTION OF A THYMOMA     04-25-22   REVISION TOTAL SHOULDER TO REVERSE TOTAL SHOULDER Right 09/07/2023   Procedure: REVISION, REVERSE TOTAL ARTHROPLASTY, SHOULDER;  Surgeon: Melita Drivers, MD;  Location: [facility];  Service: Orthopedics;  Laterality: Right;  HEMI ARTHROPLASTY, BONEGRAFT GLENOID   right total shoulder     Patient Active Problem List   Diagnosis Date Noted   S/P shoulder hemiarthroplasty, 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   Abnormal EKG 07/21/2023   Chest pain 07/20/2023   Preoperative cardiovascular examination 07/19/2023   Arthritis    Breast CA (HCC)    Hyperlipidemia    Laceration of muscle, fascia and tendon of long head of biceps, unspecified arm, initial encounter    Myasthenia gravis (HCC)    Pneumonia    S/P radiation therapy 05/09/2023   Hoarseness of voice 05/09/2023   Diverticular disease 02/08/2023   History of pulmonary embolism 12/22/2022   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   Pyelonephritis 11/10/2022   Prediabetes 05/25/2022   Type B2 thymoma (HCC)  05/12/2022   S/P Robotic Assisted Right Video Thoracoscopy with resection of Thymus 04/25/2022   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   Myasthenic syndrome (HCC) 04/12/2022   CVA (cerebral vascular accident) (HCC) 03/28/2022   HTN (hypertension) 03/28/2022   Chest mass 03/28/2022   Right knee injury 02/14/2017   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 10/16/2012   Osteoporosis 10/12/2012    PCP: Watt Harlene BROCKS, MD   REFERRING PROVIDER: Melita Drivers, MD   REFERRING DIAG: [phone] (ICD-10-CM) - Presence of right artificial shoulder joint   THERAPY DIAG:  Acute pain of right shoulder  Stiffness  of right shoulder, not elsewhere classified  Muscle weakness (generalized)  Cramp and spasm  RATIONALE FOR EVALUATION AND TREATMENT: Rehabilitation  ONSET DATE: Post-Op R TSA revision/conversion to hemi-arthroplasty on 09/07/2023   NEXT MD VISIT:  01/10/24 (~6 wks from 11/29/23)   SUBJECTIVE:                                                                                                                                                                                      SUBJECTIVE STATEMENT:  Trying not to overdo pulleys  Hand dominance: Right  PERTINENT HISTORY: MG, h/o radiation (March and May 2024) for mediastinal mass, Breast CA, HTN, OP, R TSA (09/07/23)  PAIN:  Are you having pain? Yes: NPRS scale: 2/10 Pain location: R anterior upper shoulder, scapular region upon waking in the morning  Pain description: mostly aching  Aggravating factors: reaching for the steering wheel  Relieving factors: Ice, medication   PRECAUTIONS:  Shoulder: physical therapist referral - Evaluate and treat per protocol status post revision right reverse total shoulder arthroplasty to CTA hemiarthroplasty, DOS 09/07/23 Patient unfortunately had a fracture at the base of the glenosphere making it unstable and requiring removal and conversion to hemiarthroplasty. Please adjust her physical therapy to be done under a very slow and steady and gradual advance of range of motion and gradual use of the right upper extremity within her pain tolerance. Please call me for any additional questions  RED FLAGS: None   WEIGHT BEARING RESTRICTIONS: No  FALLS:  Has patient fallen in last 6 months? Yes. Number of falls a week and a half ago, feel onto buttock while pulling weeds out of yard   LIVING ENVIRONMENT: Lives with: lives alone Lives in: House/apartment Stairs: No Has following equipment at home: Single point cane, shower chair, Grab bars, and walking stick   OCCUPATION: Retired   PLOF: Independent  with household mobility with device and Leisure: watching tv, reading, walking her dog, social gatherings w/ friends to play card games   PATIENT GOALS: Get my shoulder to functioning,  lifting shoulder, bearing weight   OBJECTIVE:  Note: Objective measures were completed at Evaluation unless otherwise noted.  DIAGNOSTIC FINDINGS:  09/20/2022: R shoulder XR  Results: Degenerative changes of the before meals and glenohumeral joints. No other acute findings.   PATIENT SURVEYS :  QuickDASH Score: 79.5 / 100 = 79.5 %  COGNITION: Overall cognitive status: Within functional limits for tasks assessed     SENSATION: WFL  POSTURE: Rounded shoulders, fwd head   UPPER EXTREMITY ROM:  LUE: Grossly WFL   Passive ROM Right eval R 11/02/23 R 11/08/23 R 11/27/23  R 11/30/23   Shoulder flexion Limited, p! Less than 90 (around 60) 84 90 120 120  Shoulder extension       Shoulder abduction Limited less than 90 74 scaption 80 scaption 100 100  Shoulder adduction       Shoulder internal rotation Limited p! 35 at 30 scaption     Shoulder external rotation Limitied p! 45 at 30 scaption 50  64 73  (Blank rows = not tested)  Active ROM R 11/30/23  Shoulder flexion 56  Shoulder extension   Shoulder abduction   Shoulder adduction   Shoulder extension   Shoulder internal rotation   Shoulder external rotation    (Blank rows = not tested)   UPPER EXTREMITY MMT:  MMT Right eval Left eval R 11/30/23  Shoulder flexion  4- 2  Shoulder extension     Shoulder abduction  4+   Shoulder adduction     Shoulder internal rotation  4-   Shoulder external rotation  4- 3-  Middle trapezius     Lower trapezius     Elbow flexion  5   Elbow extension  4+   Wrist flexion     Wrist extension     Wrist ulnar deviation     Wrist radial deviation     Wrist pronation     Wrist supination     Grip strength (lbs)     (Blank rows = not tested)  11/30/23:  R shoulder flexion active to 56 degrees R shoulder ER  3-/5  SHOULDER SPECIAL TESTS: Not Performed   JOINT MOBILITY TESTING:  Not Performed   PALPATION:  TTP: R ant glenoid, collar bone area (tender, not painful)                                                                                                                              TREATMENT DATE:  01/04/24 THERAPEUTIC EXERCISE: To improve strength, endurance, ROM, and flexibility.  Demonstration, verbal and tactile cues throughout for technique.  Seated pulleys: Flexion & Scaption x 3 min each - emphasizing submax effort at ~25% effort with eccentric lowering  S/L R shoulder ER AROM x 10 with 3 oz putty S/L R shoulder flexion AAROM x 10; AROM x 5- fatigued  THERAPEUTIC ACTIVITIES: To improve functional performance.  Demonstration, verbal and tactile cues throughout for technique.  Functional reaches R  shoulder flexion with hand to 9 yoga block lifting 3 oz putty tub - performed to onset of fatigue/increased substitution (shoulder hike) Functional reaches R shoulder scaption with hand to 9 yoga block lifting 3 oz putty tub x 5 - fatigued  NEUROMUSCULAR RE-EDUCATION: To improve coordination and proprioception Standing YTB scap retraction/depression + B shoulder row x 10    12/26/2023 THERAPEUTIC EXERCISE: To improve strength, endurance, ROM, and flexibility.  Demonstration, verbal and tactile cues throughout for technique.  Seated pulleys: Flexion & Scaption x 3 min each - emphasizing submax effort at ~25% effort with eccentric lowering  S/L R shoulder ER AROM x 5, lightly resisted AROM with 3 oz putty tub to onset of fatigue (~7 reps) S/L R shoulder flexion AA/AROM x 10   THERAPEUTIC ACTIVITIES: To improve functional performance.  Demonstration, verbal and tactile cues throughout for technique.  Functional reaches R shoulder flexion with hand to 9 yoga block lifting 3 oz putty tub - performed to onset of fatigue/increased substitution (shoulder hike) Functional reaches R  shoulder scaption with hand to 9 yoga block lifting 3 oz putty tub - performed to onset of fatigue (feeling of clunking in shoulder)  NEUROMUSCULAR RE-EDUCATION: To improve coordination, kinesthesia, posture, and proprioception. Supine R shoulder rhythmic stabilization IR/ER with shoulder 30 degrees from trunk and elbow at 90 degrees on towel roll Supine R scapular punches with PT supporting arm lightly  Supine R shoulder CW/CCW circles x 10 each with PT supporting arm lightly Standing YTB scap retraction/depression + B shoulder extension stopping shy of neutral x 10 Standing YTB scap retraction/depression + B shoulder row x 10 Standing scapular press-ups leaning lightly on elevated hi/low table with elbows extended        12/28/23:  Seated for shoulder pulley, PROM on R, flex and scaption 2-3 min each Supine for retrograde massage, and gentle AAROM  Supine for various rhythmic stabilization ex: With towel roll under R elbow for shoulder IR/ER elbow 90 degrees, shoulder 30 degrees from trunk Scapular punches with therapist supporting arm lightly in supine PNF sword pulls from L ant hip to R shoulder scaption position, therapist providing manual support and resistance.   Seated for playground ball squeeze, engaging B shoulder depression and small forward shoulder flexion Seated for manually assisted R UE horizontal adduction/ abduction , gentle, small range Seated rows with yellow theraband, small movements, pt able to maintain B shoulder depression with the rows Seated R triceps extension therapist providing manual resistance   12/26/2023 THERAPEUTIC EXERCISE: To improve strength, endurance, ROM, and flexibility.  Demonstration, verbal and tactile cues throughout for technique.  Pulleys: Flexion & Scaption x 3 min each - emphasizing submax effort at ~25% effort with eccentric lowering  Gentle R UT and LS stretches x 30 each  MANUAL THERAPY: To promote normalized muscle tension,  improved flexibility, improved joint mobility, increased ROM, and pain modulation utilizing connective tissue massage, therapeutic massage, and manual TP therapy.  STM/DTM and manual TPR to R UT and LS  THERAPEUTIC ACTIVITIES: To improve functional performance.  Demonstration, verbal and tactile cues throughout for technique.  R shoulder AAROM wall slides with slight support from L hand - cues to avoid shoulder shrug - fatigues quickly Functional reaches R shoulder flexion with hand to 14 box placed on counter - increased shoulder shrug observed, therefore reduced height to 9 stool on counter and performed in flexion and scaption to point of fatigue/increased substitution  NEUROMUSCULAR RE-EDUCATION: To improve coordination, kinesthesia, posture, proprioception, and amplitude of  movement.  PT providing tactile cues with fingertips on medial border of scapula. Scap retraction + small ROM B shoulder row (emphasis on scapular movement/activation) 2 x 5 Scap retraction + small ROM B shoulder extension +emphasis on scapular movement/activation) 2 x 5   12/21/23  Therapeutic activity: UBE 3 min F, attempted backward but unable to avoid shrugging R shoulder so discontinued Supine for gentle PROM all planes R shoulder  Supine with towel roll under humerus, yellow t band ER L with static positioning R shoulder, with B humerus against trunk, mass practice Side lying L for R shoulder ER with towel under axilla, mass practice, really cued verbally and manually for R shoulder depression to avoid impinging ant/superior GH jt Side lying L for R shoulder flexion, therapist assisted, manually depressed R scapula to achieve better movement pattern Standing for scaption , pt grasping sink and leaning posteriorly to distract R shoulder and flexing trunk to achieve gentle stretch into scaption Standing L forearm on counter for R shoulder extension, again emphasis on avoiding shrugging, engaging proper scapular  retraction while moving R shoulder, advised pt to trial at home some this week.   12/14/2023 THERAPEUTIC EXERCISE: To improve ROM and flexibility.  Demonstration, verbal and tactile cues throughout for technique.  Pulleys: Flexion & Scaption x 3 min each - emphasizing submax effort at ~25% effort with eccentric lowering  Supine R should er flexion PROM with PT inhibiting UT/shoulder shrug and cues to minimize muscle activation/guarding - pain increased when pt attempting AAROM Supine R shoulder scaption P/AAROM - better tolerance today Supine R shoulder IR/ER P/AAROM in scapular plane S/L R shoulder flexion PROM progressing to AAROM within pain free ROM  MANUAL THERAPY: To promote normalized muscle tension, improved flexibility, improved joint mobility, increased ROM, pain modulation, and reduced pain utilizing joint mobilization, connective tissue massage, therapeutic massage, manual TP therapy, and myofascial release. Supine gentle R shoulder oscillation and grade I-II inferior glide for pain relief/muscle relxation R shoulder incisional scar massage Supine and S/L STM/DTM and gentle manual TPR to R infraspinatus and teres group  L S/L R scapular mobilization  MODALITIES:  TENS unit to R shoulder complex - intensity to pt tolerance x 20 min + moist heat pack for pain relief and decreased muscle guarding  SELF CARE: Provided education on home TENS unit set-up and use and continued limited active R UE use until pain better controlled.    12/12/2023  THERAPEUTIC EXERCISE: To improve ROM and flexibility.  Demonstration, verbal and tactile cues throughout for technique.  Pulleys: Flexion & Scaption x 3 min each - reviewed submax effort at ~25% effort with eccentric lowering  Supine R shoulder flexion and scaption P/AAROM with PT inhibiting UT/shoulder shrug   MANUAL THERAPY: To promote normalized muscle tension, improved flexibility, improved joint mobility, increased ROM, and pain modulation  utilizing joint mobilization, connective tissue massage, therapeutic massage, and manual TP therapy.  Supine gentle R shoulder oscillation and grade I-II inferior glide for pain relief/muscle relxation Supine with towel roll supporting R post humerus, for gentle ROM R shoulder, with intermittent retrograde massage, STM/DTM and manual TPR to R UT, LS, pecs, subscapularis, teres group and infrapinatus  L S/L R scapular mobilization  MODALITIES:  TENS unit to R shoulder complex - intensity to pt tolerance x 20 min + moist heat pack for pain relief and decreased muscle guarding  SELF CARE: Provided education on pain management options.  Provided education on home TENS unit options including proper set up, precautions  and recommended home frequency for use.   12/07/23:  Shoulder pulley flex, scaption, 2 min each.  The patient has been eccentrically lowering her R shoulder with the pulleys, advised her to reduce her engagement of musculature with eccentric lowering to 25% vs a maximal contraction that she has been utilizing. Supine with towel roll supporting R post humerus, for gentle ROM R shoulder, with intermittent retrograde massage.  Assisted R scapular/serratus punches 10x Supine chest press with yard stick 10x Supine manually resisted isometrics for R shoulder ER 10 reps, 5 sec holds Side lying L for R shoulder isometric adduction, towel roll in axilla,  5 sec holds, 10 reps  Side lying L for R shoulder ER 10 reps , pt reported soreness so stopped Side lying for assisted R shoulder flexion 10 reps, therapist supported under R hand    11/30/23:  Reassessed for 10th visit progress report Progressed therex according to pts tolerance and to protocol, now 12 weeks post op Shoulder pulley x 3 min flexion and scaption, slow Supine for serratus punches, initially with therapist supporting R arm and then with yard stick Supine for chest presses with yard stick Supine L shoulder ER with yellow  theraband, stabilizing, static position R shoulder Side lying L for R shoulder ER Side lying L for R shoulder flexion Updated HEP below   11/27/2023 THERAPEUTIC EXERCISE: To improve strength, endurance, ROM, and flexibility.  Demonstration, verbal and tactile cues throughout for technique.  Pulleys: Flexion & Scaption x 3 min each  Standing R shoulder flexion/scaption wall slides x 5 - AAROM required with L hand at elbow- limited today Standing R shoulder flexion counter slides with 2# weight on washcloth 2 x 10 Standing R shoulder scaption counter slides with 2# weight on washcloth 2 x 10 Seated hand clasped AAROM shoulder flexion x 5  NEUROMUSCULAR RE-EDUCATION: To improve coordination, kinesthesia, and posture. R shoulder flexion YTB reactive isometric step-outs x 10 (neutral shoulder with elbow flexed to 90) R shoulder extension YTB reactive isometric step-outs x 10 (neutral shoulder with elbow flexed to 90) - cues for scapular retraction activation to stabilize shoulder R shoulder IR YTB reactive isometric step-outs x 10 (neutral shoulder with elbow flexed to 90) R shoulder ER YTB reactive isometric step-outs x 10 (neutral shoulder with elbow flexed to 90) - cues for scapular retraction activation to stabilize shoulder   11/23/2023 THERAPEUTIC EXERCISE: To improve strength, endurance, ROM, and flexibility.  Demonstration, verbal and tactile cues throughout for technique.  Pulleys: Flexion & Scaption x 3 min each  Standing R shoulder flexion/scaption wall slides x 10 - AAROM required with L hand at elbow Standing R shoulder flexion counter slides with 2# weight on washcloth 2 x 10 Standing R shoulder scaption counter slides with 2# weight on washcloth 2 x 10 Seated B shoulder orange Pball flexion & scaption walk-out x 10 each (attempted initially on wall but deferred d/t excessive R shoulder shrugging  NEUROMUSCULAR RE-EDUCATION: To improve coordination, kinesthesia, and posture. R  shoulder flexion YTB reactive isometric step-outs x 5 (neutral shoulder with elbow flexed to 90) R shoulder extension YTB reactive isometric step-outs x 5 (neutral shoulder with elbow flexed to 90) - cues for scapular retraction activation to stabilize shoulder R shoulder IR YTB reactive isometric step-outs x 5 (neutral shoulder with elbow flexed to 90) R shoulder ER YTB reactive isometric step-outs x 5 (neutral shoulder with elbow flexed to 90) - cues for scapular retraction activation to stabilize shoulder   11/21/2023  THERAPEUTIC  EXERCISE: To improve strength, endurance, ROM, and flexibility.  Demonstration, verbal and tactile cues throughout for technique. Pulleys: Flexion & Scaption x 3 min each  Supine for gentle R shoulder PROM/stretching all planes Supine flexed elbow R shoulder flexion/scaption (uppercut) AA/AROM 2 x 10 - somewhat better tolerance for AROM but still slight guiding from PT initially with motion  MANUAL THERAPY: To promote normalized muscle tension, improved flexibility, improved joint mobility, and reduced pain utilizing connective tissue massage, therapeutic massage, manual TP therapy, and scar mobilization.  STM/XFM to R shoulder surgical incision Supine gentle R shoulder oscillation and grade I-II inferior glide for pain relief/muscle relxation  NEUROMUSCULAR RE-EDUCATION: To improve coordination, kinesthesia, and posture. S/L R shoulder ER maintaining mini-pillow under axilla and R elbow at 90 degrees 2 x 10 - VC & TC for scapular engagement, avoiding shoulder shrug S/L R shoulder flexion - initially attempted with elbow straight but transitioned to R elbow at 90 degrees x 10  SELF CARE:  Provided education on sleeping position and use of pillow(s) to support R arm.    11/17/2023  THERAPEUTIC EXERCISE: To improve ROM and flexibility.  Demonstration, verbal and tactile cues throughout for technique.  Pulleys: Flexion & Scaption x 3 min each  Supine for  gentle R shoulder PROM/stretching all planes Supine flexed elbow R shoulder flexion/scaption (uppercut) AA/AROM 2 x 10 - AAROM d/t increased discomfort with AROM  MANUAL THERAPY: To promote normalized muscle tension, improved flexibility, improved joint mobility, and reduced pain utilizing connective tissue massage, therapeutic massage, manual TP therapy, and scar mobilization.  STM/XFM to R shoulder surgical incision STM/DTM and manual TPR to R pecs  NEUROMUSCULAR RE-EDUCATION: To improve coordination, kinesthesia, and posture. S/L R shoulder ER maintaining towel roll under axilla and R elbow at 90 degrees 2 x 10 - VC & TC for scapular engagement S/L R shoulder flexion - initially attempted with elbow straight but transitioned to R elbow at 90 degrees x 10 Seated YTB scap retraction + B shoulder row x 10 Seated YTB scap retraction + B shoulder extension stopping shy of neutral x 10   11/14/23: Pulleys 3 min flexion B Supine for gentle stretching R shoulder all planes Supine with R humerus supported in line with trunk for manually resisted R shoulder isometric inv/ev mass practice Side lying L for R shoulder ER maintaining towel roll under axilla and R elbow at 90 degrees Standing with R elbow supported on counter, pulling towel with 6# on towel from flexed position to neutral, then advance to sitting in chair for yellow t band rows, needed frequent cues to avoid R shoulder extension past trunk, had pt slide back in chair so that the seat back blocked her from extending R shoulder    11/08/2023  THERAPEUTIC EXERCISE: To improve ROM and flexibility.  Demonstration, verbal and tactile cues throughout for technique.  Pulleys: Flexion x 3 min, Scaption x 3 min (scaption better tolerated today) Standing counter wash AAROM LUE: Flexion x 20 Scaption x 20 R shoulder PROM within pain tolerance for flexion, scaption, IR and ER Measured PROM Shoulder isometrics: flex, abd, ER, ext 5x5 Standing  YTB scap retraction + B shoulder row- clicking clicking at first but after readjusting no clicking  Standing YTB scap retraction + B shoulder extension stopping shy of neutral x 10   11/06/2023  THERAPEUTIC EXERCISE: To improve ROM and flexibility.  Demonstration, verbal and tactile cues throughout for technique.  Pulleys: Flexion x 3 min, Scaption x 3 min (scaption better  tolerated today) Seated R shoulder AAROM with Swiffer: Flexion 2 x 10 Scaption 2 x 10 R shoulder PROM by PT within pain tolerance for flexion, scaption, IR and ER  Hooklying R shoulder AAROM with cane: Flexion 2 x 10 Scaption x 10 - pt needing PT guidance/assistance for proper movement pattern  NEUROMUSCULAR RE-EDUCATION: To improve coordination, kinesthesia, posture, and proprioception. R shoulder rhythmic stabilization at 90 flexion (1-finger perturbations) 2 x 15-20 sec R shoulder protraction AAROM with wand 2 x 10 Standing YTB scap retraction + B shoulder row x 10 Standing YTB scap retraction + B shoulder extension stopping shy of neutral x 10   11/02/2023  THERAPEUTIC EXERCISE: To improve strength, endurance, ROM, and flexibility.  Demonstration, verbal and tactile cues throughout for technique.  Pulleys: Flexion x 3 min, Scaption x 1.5 min (discontinued d/t increasing discomfort with scaption) Table slides standing at counter top:  Flexion 2 x 10 Scaption 2 x 10 Seated R shoulder AAROM with Swiffer: Flexion 2 x 10 Scaption 2 x 10 R shoulder PROM by PT within pain tolerance for flexion, scaption, IR and ER (ER limited to ~45 per protocol) Standing R shoulder submax (~25% effort) isometrics into wall 5 x 5 - flexion, abduction, extension, IR & ER Standing scapular retraction 10 x 5  SELF CARE:  Yellow Theraputty provided for home grip strengthening   10/30/2023 SELF CARE:  Reviewed eval findings and role of PT in addressing identified deficits as well as instruction in initial HEP (see below).     PATIENT EDUCATION:  Education details: HEP review and cautioned patient to adjust reps/set to avoid increased pain and pushing past point of fatigue/loss of control  Person educated: Patient Education method: Explanation, Demonstration, Verbal cues, and Tactile cues Education comprehension: verbalized understanding, returned demonstration, verbal cues required, tactile cues required, and needs further education  HOME EXERCISE PROGRAM: Access Code: [email or web address] URL: [email or web address] Date: 11/30/2023 Prepared by: Greig Speaks  Exercises - Sidelying Shoulder External Rotation  - 1 x daily - 3 x weekly - 3 sets - 10 reps - Sidelying Shoulder Flexion 15 Degrees  - 1 x daily - 3 x weekly - 2 sets - 10 reps - 3 sec hold - Seated Shoulder Extension and Scapular Retraction with Resistance  - 1 x daily - 3 x weekly - 2 sets - 10 reps - 3-5 sec hold - Seated Shoulder Row with Anchored Resistance  - 1 x daily - 3 x weekly - 2 sets - 10 reps - 3-5 hold hold - Shoulder Scar Massage  - 1-2 x daily - 7 x weekly - 1-2 min hold - Supine Shoulder Protraction with Dowel  - 1 x daily - 7 x weekly - 3 sets - 10 reps - Supine Shoulder Press with Dowel  - 1 x daily - 7 x weekly - 3 sets - 10 reps - Supine Shoulder External Rotation with Resistance  - 1 x daily - 7 x weekly - 3 sets - 10 reps   ASSESSMENT:  CLINICAL IMPRESSION:   Therapeutic interventions continue to focus on awareness of proper glenohumeral movement minimizing compensatory movements. She continues to require significant cues for facilitation/guidance for good shoulder mechanics. Shaeley will benefit from continued skilled PT to address ongoing R shoulder ROM, glenohumeral control and strength deficits to functional UE use and activity tolerance with decreased pain interference.   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 Pt reports that she has orders to limit R arm use to waist level only, avoid reaching overhead at this time. She is having difficulty with forward and overhead reaching for items, opening cans w/ R hand, washing hair, and driving. She states that she has learned ways to compensate using her left arm even though she is R hand dominant. Today's assessment was done with all items remaining within patient's tolerance. PROM revealed limitations in flexion, IR, ER, and abd as movement was limited by both pain and muscular guarding. Pt had difficulty fully relaxing the R arm for PROM today and displayed hesitancy to movement beyond waist side. Pt was educated on the importance of taking recovery slow at this point (as advised) and avoid heavy lifting and resistance training after asking to try bicep curls with a small weight at home. Pt is aware that at this stage, it is important to focus of restoring as much ROM as she can and gradually progressing towards more strengthening activities with therapy. Pt scored a 79.5% on the QuickDASH representing severe disability of the UE in day to day activities.  Zaelyn will benefit from skilled PT intervention to address current deficits to improve functional ability, mobility, and activity tolerance w/ dec'd pain interference.   OBJECTIVE IMPAIRMENTS: decreased activity tolerance, decreased endurance, decreased knowledge of condition, decreased mobility, decreased ROM, decreased strength, increased fascial restrictions, impaired perceived functional ability, increased muscle spasms, impaired UE functional use, postural dysfunction, and pain.   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   REHAB POTENTIAL: Good  CLINICAL DECISION MAKING: Evolving/moderate complexity  EVALUATION COMPLEXITY: Moderate  GOALS: Goals reviewed with patient? Yes  SHORT TERM GOALS: Target date: 12/11/2023    Patient will be independent with initial HEP.  Baseline: Goal status: MET - 11/16/23  2.  Patient will report at least 25% improvement in R shoulder pain to improve QoL. Baseline: Worst 5-7/10 11/17/23 - pt noting shoulder more irritable over past week 12/12/23 - R shoulder more irritable (3-4/10) Goal status: MET - 01/02/24 - pain better controlled with minimal to no pain over past 2 weeks  3.  Patient will improve R shoulder flexion PROM to 90 or better to aid in difficulty with forward reaching activities.  Baseline: limited by pain ~60 deg of flexion  Goal status: MET - 11/08/23 - no pain  4.  Patient will decrease her QuickDASH score by at least 15% to decrease severity of disability.  Baseline: 79.5% Goal status:  MET - 11/30/23 - QuickDASH Score: 45.5 / 100 = 45.5 %  LONG TERM GOALS: Target date: 01/22/2024  Patient will be independent with advanced HEP.  Baseline:  Goal status: IN PROGRESS - 12/26/23 - cautioned pt to ease back into shoulder pulleys & scapular exercises with HEP, avoiding increased pain and being aware of fatigue and adjusting reps/sets accordingly  2.  Patient will decrease her QuickDASH score to by at least 25-30% to improve function and activity tolerance d/t disability. Baseline: 79.5% Goal status: IN PROGRESS - 11/30/23 - QuickDASH Score:  45.5 / 100 = 45.5 %  3.  Patient will be able to perform overhead activities such as washing hair, retrieving items from cabinets to improve independent  function at home. Baseline: pt is having difficulty with above activities  Goal status: IN PROGRESS - 11/30/23: progressing not met  4.  Patient will report at least 50% improvement in R shoulder pain to improve QoL.  Baseline: 5-7/10 11/30/23:  0 at rest, up to 3 with use , met so far 12/14/23 - pain has been increased over past few visits (3-4/10 today) Goal status: IN PROGRESS - 01/02/24 - pain better controlled with minimal to no pain over past 2 weeks  5.  Patient will obtain at least 3+/5 MMT of UE for available range to increase activity tolerance.   Baseline: Unable to assess Goal status: IN PROGRESS - 11/30/23: progressing   PLAN: PT FREQUENCY: 2x/week  PT DURATION: 12 weeks  PLANNED INTERVENTIONS: 02835- PT Re-evaluation, 97750- Physical Performance Testing, 97110-Therapeutic exercises, 97530- Therapeutic activity, [id number]- Neuromuscular re-education, 97535- Self Care, 02859- Manual therapy, [phone]- Gait training, [phone]- Electrical stimulation (unattended), 97035- Ultrasound, 02987- Traction (mechanical), 20560 (1-2 muscles), 20561 (3+ muscles)- Dry Needling, Patient/Family education, Taping, Joint mobilization, Cryotherapy, and Moist heat  PLAN FOR NEXT SESSION: Resume gentle R shoulder P/AA/AROM within pain free motion and watching closely for substitution related to fatigue; carefully review/revise HEP as indicated with only minimal changes at a time to avoid re-irritation of R shoulder; may use Reverse Total Shoulder Arthroplasty for Fracture Protocol for guidance (Sx 09/07/23) - post-op week #17 as of 01/04/24   Sol LITTIE Gaskins, PTA 01/04/2024, 12:51 PM

## 2024-01-05 DIAGNOSIS — G7001 Myasthenia gravis with (acute) exacerbation: Secondary | ICD-10-CM | POA: Diagnosis not present

## 2024-01-05 DIAGNOSIS — G709 Myoneural disorder, unspecified: Secondary | ICD-10-CM | POA: Diagnosis not present

## 2024-01-09 ENCOUNTER — Encounter: Payer: Self-pay | Admitting: Physical Therapy

## 2024-01-09 ENCOUNTER — Ambulatory Visit: Admitting: Physical Therapy

## 2024-01-09 DIAGNOSIS — M25611 Stiffness of right shoulder, not elsewhere classified: Secondary | ICD-10-CM | POA: Diagnosis not present

## 2024-01-09 DIAGNOSIS — M6281 Muscle weakness (generalized): Secondary | ICD-10-CM | POA: Diagnosis not present

## 2024-01-09 DIAGNOSIS — G8929 Other chronic pain: Secondary | ICD-10-CM | POA: Diagnosis not present

## 2024-01-09 DIAGNOSIS — R252 Cramp and spasm: Secondary | ICD-10-CM

## 2024-01-09 DIAGNOSIS — S46211A Strain of muscle, fascia and tendon of other parts of biceps, right arm, initial encounter: Secondary | ICD-10-CM | POA: Diagnosis not present

## 2024-01-09 DIAGNOSIS — M25511 Pain in right shoulder: Secondary | ICD-10-CM | POA: Diagnosis not present

## 2024-01-09 NOTE — Therapy (Signed)
 OUTPATIENT PHYSICAL THERAPY TREATMENT  Progress Note  Reporting Period 11/30/2023 to 01/09/2024   See note below for Objective Data and Assessment of Progress/Goals.      Patient Name: Faith Massey MRN: [id number] DOB:12-17-1945, 78 y.o., female Today's Date: 01/09/2024  END OF SESSION:  PT End of Session - 01/09/24 1146     Visit Number 19    Date for Recertification  01/22/24    Authorization Type [employer]    Progress Note Due on Visit 20    PT Start Time 1146    PT Stop Time 1241    PT Time Calculation (min) 55 min    Activity Tolerance Patient tolerated treatment well;Patient limited by fatigue    Behavior During Therapy [facility] for tasks assessed/performed              Past Medical History:  Diagnosis Date   Arthritis    Breast CA (HCC)    Chest mass 03/28/2022   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 10/16/2012   Pneumonia    Pre-diabetes    Prediabetes 05/25/2022   Pyelonephritis 11/10/2022   Right knee injury 02/14/2017   S/P radiation therapy 05/09/2023   S/P Robotic Assisted Right Video Thoracoscopy with resection of Thymus 04/25/2022   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   Past Surgical History:  Procedure Laterality Date   ABDOMINAL HYSTERECTOMY  03/22/2003   BACK SURGERY  03/21/1998   BREAST SURGERY     reconstruction   colon susrgery      CYSTOSCOPY  WITH STENT PLACEMENT N/A 02/08/2023   Procedure: POSSIBLE CYSTOSCOPY WITH URETERAL STENT PLACEMENT;  Surgeon: Alvaro Ricardo KATHEE Mickey., MD;  Location: [facility];  Service: Urology;  Laterality: N/A;   LEFT HEART CATH AND CORONARY ANGIOGRAPHY N/A 07/20/2023   Procedure: LEFT HEART CATH AND CORONARY ANGIOGRAPHY;  Surgeon: Verlin Lonni BIRCH, MD;  Location: [facility];  Service: Cardiovascular;  Laterality: N/A;   MASTECTOMY Bilateral 03/21/1993   RESECTION OF A THYMOMA     04-25-22   REVISION TOTAL SHOULDER TO REVERSE TOTAL SHOULDER Right 09/07/2023   Procedure: REVISION, REVERSE TOTAL ARTHROPLASTY, SHOULDER;  Surgeon: Melita Drivers, MD;  Location: [facility];  Service: Orthopedics;  Laterality: Right;  HEMI ARTHROPLASTY, BONEGRAFT GLENOID   right total shoulder     Patient Active Problem List   Diagnosis Date Noted   S/P shoulder hemiarthroplasty, 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   Abnormal EKG 07/21/2023   Chest pain 07/20/2023   Preoperative cardiovascular examination 07/19/2023   Arthritis    Breast CA (HCC)    Hyperlipidemia    Laceration of muscle, fascia and tendon of long head of biceps, unspecified arm, initial encounter    Myasthenia gravis (HCC)    Pneumonia    S/P radiation therapy 05/09/2023   Hoarseness of voice 05/09/2023   Diverticular disease 02/08/2023  History of pulmonary embolism 12/22/2022   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   Pyelonephritis 11/10/2022   Prediabetes 05/25/2022   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   S/P Robotic Assisted Right Video Thoracoscopy with resection of Thymus 04/25/2022   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   Myasthenic syndrome (HCC) 04/12/2022   CVA (cerebral vascular accident) (HCC) 03/28/2022   HTN (hypertension) 03/28/2022   Chest mass 03/28/2022   Right knee injury 02/14/2017   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 10/16/2012   Osteoporosis 10/12/2012    PCP: Watt Harlene BROCKS, MD   REFERRING PROVIDER: Melita Drivers, MD   REFERRING  DIAG: [phone] (ICD-10-CM) - Presence of right artificial shoulder joint   THERAPY DIAG:  Acute pain of right shoulder  Stiffness of right shoulder, not elsewhere classified  Muscle weakness (generalized)  Cramp and spasm  RATIONALE FOR EVALUATION AND TREATMENT: Rehabilitation  ONSET DATE: Post-Op R TSA revision/conversion to hemi-arthroplasty on 09/07/2023   NEXT MD VISIT:  01/10/24 (~6 wks from 11/29/23)   SUBJECTIVE:                                                                                                                                                                                      SUBJECTIVE STATEMENT:  Pt reporting some increased some increased pain for the last day or two w/o known trigger, although she did have company over the over the weekend that required extra house prep and cooking.  Hand dominance: Right  PERTINENT HISTORY: MG, h/o radiation (March and May 2024) for mediastinal mass, Breast CA, HTN, OP, R TSA (09/07/23)  PAIN:  Are you having pain? Yes: NPRS scale: 3-4/10 Pain location: R anterior upper shoulder, scapular region upon waking in the morning  Pain description: mostly aching  Aggravating factors: reaching for the steering wheel  Relieving factors: Ice, medication   PRECAUTIONS:  Shoulder: physical therapist referral - Evaluate and treat per protocol status post revision right reverse total shoulder arthroplasty to CTA hemiarthroplasty, DOS 09/07/23 Patient unfortunately had a fracture at the base of the glenosphere making it unstable and requiring removal and conversion to hemiarthroplasty. Please adjust her physical therapy to be done under a very slow and steady and gradual advance of range of motion and gradual use of the right upper extremity within her pain tolerance. Please call me for any additional questions  RED FLAGS: None   WEIGHT BEARING RESTRICTIONS: No  FALLS:  Has patient fallen in last 6 months? Yes. Number of falls a week  and a half ago, feel onto buttock while pulling weeds out of yard   LIVING ENVIRONMENT: Lives with: lives alone Lives in: House/apartment Stairs: No Has  following equipment at home: Single point cane, shower chair, Grab bars, and walking stick   OCCUPATION: Retired   PLOF: Independent with household mobility with device and Leisure: watching tv, reading, walking her dog, social gatherings w/ friends to play card games   PATIENT GOALS: Get my shoulder to functioning, lifting shoulder, bearing weight   OBJECTIVE:  Note: Objective measures were completed at Evaluation unless otherwise noted.  DIAGNOSTIC FINDINGS:  09/20/2022: R shoulder XR  Results: Degenerative changes of the before meals and glenohumeral joints. No other acute findings.   PATIENT SURVEYS :  QuickDASH Score: 79.5 / 100 = 79.5 %  COGNITION: Overall cognitive status: Within functional limits for tasks assessed     SENSATION: WFL  POSTURE: Rounded shoulders, fwd head   UPPER EXTREMITY ROM:  LUE: Grossly WFL   Passive ROM Right eval R 11/02/23 R 11/08/23 R 11/27/23  R 11/30/23   Shoulder flexion Limited, p! Less than 90 (around 60) 84 90 120 120  Shoulder extension       Shoulder abduction Limited less than 90 74 scaption 80 scaption 100 100  Shoulder adduction       Shoulder internal rotation Limited p! 35 at 30 scaption     Shoulder external rotation Limitied p! 45 at 30 scaption 50  64 73  (Blank rows = not tested)  Active ROM R 11/30/23 R 01/09/24  Shoulder flexion 56 62  Shoulder extension  38  Shoulder abduction  60  Shoulder adduction    Shoulder internal rotation  61 - FIR to buttock  Shoulder external rotation  51   (Blank rows = not tested)   UPPER EXTREMITY MMT:  MMT Right eval Left eval R 11/30/23 R 01/09/24 L 01/09/24  Shoulder flexion  4- 2 2 4+  Shoulder extension    4 4+  Shoulder abduction  4+  2 4+  Shoulder adduction       Shoulder internal rotation  4-  4- 4  Shoulder external  rotation  4- 3- 3+ 4-  Middle trapezius       Lower trapezius       Elbow flexion  5     Elbow extension  4+     Wrist flexion       Wrist extension       Wrist ulnar deviation       Wrist radial deviation       Wrist pronation       Wrist supination       Grip strength (lbs)       (Blank rows = not tested)  11/30/23:  R shoulder flexion active to 56 degrees R shoulder ER 3-/5  SHOULDER SPECIAL TESTS: Not Performed   JOINT MOBILITY TESTING:  Not Performed   PALPATION:  TTP: R ant glenoid, collar bone area (tender, not painful)  TREATMENT DATE:   01/08/24 THERAPEUTIC EXERCISE: To improve ROM and flexibility.  Demonstration, verbal and tactile cues throughout for technique.  Seated pulleys: Flexion & Scaption x 3 min each - emphasizing submax effort at ~25% effort with eccentric lowering, avoiding shoulder shrug and forcing painful ROM  MANUAL THERAPY: To promote normalized muscle tension, improved flexibility, improved joint mobility, increased ROM, and reduced pain utilizing joint mobilization, connective tissue massage, therapeutic massage, manual TP therapy, and myofascial release.  Supine gentle R shoulder oscillation and grade I-II inferior glide for pain relief/muscle relxation STM/DTM and manual TPR to R UT, LS, infraspinatus, teres group and subscapularis  THERAPEUTIC ACTIVITIES: To improve functional performance.  Demonstration, verbal and tactile cues throughout for technique. R shoulder ROM assessment UE MMT QuickDASH: 47.7 / 100 = 47.7 %  NEUROMUSCULAR RE-EDUCATION: To improve coordination, kinesthesia, and posture. Seated PT assisted AAROM/MWM in R shoulder flexion and scaption to reduce shoulder shrug and promote improved scapular and GH coordination   01/04/24 THERAPEUTIC EXERCISE: To improve strength, endurance, ROM, and flexibility.   Demonstration, verbal and tactile cues throughout for technique.  Seated pulleys: Flexion & Scaption x 3 min each - emphasizing submax effort at ~25% effort with eccentric lowering  S/L R shoulder ER AROM x 10 with 3 oz putty S/L R shoulder flexion AAROM x 10; AROM x 5- fatigued  THERAPEUTIC ACTIVITIES: To improve functional performance.  Demonstration, verbal and tactile cues throughout for technique.  Functional reaches R shoulder flexion with hand to 9 yoga block lifting 3 oz putty tub - performed to onset of fatigue/increased substitution (shoulder hike) Functional reaches R shoulder scaption with hand to 9 yoga block lifting 3 oz putty tub x 5 - fatigued  NEUROMUSCULAR RE-EDUCATION: To improve coordination and proprioception Standing YTB scap retraction/depression + B shoulder row x 10    12/26/2023 THERAPEUTIC EXERCISE: To improve strength, endurance, ROM, and flexibility.  Demonstration, verbal and tactile cues throughout for technique.  Seated pulleys: Flexion & Scaption x 3 min each - emphasizing submax effort at ~25% effort with eccentric lowering  S/L R shoulder ER AROM x 5, lightly resisted AROM with 3 oz putty tub to onset of fatigue (~7 reps) S/L R shoulder flexion AA/AROM x 10   THERAPEUTIC ACTIVITIES: To improve functional performance.  Demonstration, verbal and tactile cues throughout for technique.  Functional reaches R shoulder flexion with hand to 9 yoga block lifting 3 oz putty tub - performed to onset of fatigue/increased substitution (shoulder hike) Functional reaches R shoulder scaption with hand to 9 yoga block lifting 3 oz putty tub - performed to onset of fatigue (feeling of clunking in shoulder)  NEUROMUSCULAR RE-EDUCATION: To improve coordination, kinesthesia, posture, and proprioception. Supine R shoulder rhythmic stabilization IR/ER with shoulder 30 degrees from trunk and elbow at 90 degrees on towel roll Supine R scapular punches with PT supporting arm  lightly  Supine R shoulder CW/CCW circles x 10 each with PT supporting arm lightly Standing YTB scap retraction/depression + B shoulder extension stopping shy of neutral x 10 Standing YTB scap retraction/depression + B shoulder row x 10 Standing scapular press-ups leaning lightly on elevated hi/low table with elbows extended        12/28/23:  Seated for shoulder pulley, PROM on R, flex and scaption 2-3 min each Supine for retrograde massage, and gentle AAROM  Supine for various rhythmic stabilization ex: With towel roll under R elbow for shoulder IR/ER elbow 90 degrees, shoulder 30 degrees from trunk Scapular punches with therapist  supporting arm lightly in supine PNF sword pulls from L ant hip to R shoulder scaption position, therapist providing manual support and resistance.   Seated for playground ball squeeze, engaging B shoulder depression and small forward shoulder flexion Seated for manually assisted R UE horizontal adduction/ abduction , gentle, small range Seated rows with yellow theraband, small movements, pt able to maintain B shoulder depression with the rows Seated R triceps extension therapist providing manual resistance   12/26/2023 THERAPEUTIC EXERCISE: To improve strength, endurance, ROM, and flexibility.  Demonstration, verbal and tactile cues throughout for technique.  Pulleys: Flexion & Scaption x 3 min each - emphasizing submax effort at ~25% effort with eccentric lowering  Gentle R UT and LS stretches x 30 each  MANUAL THERAPY: To promote normalized muscle tension, improved flexibility, improved joint mobility, increased ROM, and pain modulation utilizing connective tissue massage, therapeutic massage, and manual TP therapy.  STM/DTM and manual TPR to R UT and LS  THERAPEUTIC ACTIVITIES: To improve functional performance.  Demonstration, verbal and tactile cues throughout for technique.  R shoulder AAROM wall slides with slight support from L hand - cues to avoid  shoulder shrug - fatigues quickly Functional reaches R shoulder flexion with hand to 14 box placed on counter - increased shoulder shrug observed, therefore reduced height to 9 stool on counter and performed in flexion and scaption to point of fatigue/increased substitution  NEUROMUSCULAR RE-EDUCATION: To improve coordination, kinesthesia, posture, proprioception, and amplitude of movement.  PT providing tactile cues with fingertips on medial border of scapula. Scap retraction + small ROM B shoulder row (emphasis on scapular movement/activation) 2 x 5 Scap retraction + small ROM B shoulder extension +emphasis on scapular movement/activation) 2 x 5   12/21/23  Therapeutic activity: UBE 3 min F, attempted backward but unable to avoid shrugging R shoulder so discontinued Supine for gentle PROM all planes R shoulder  Supine with towel roll under humerus, yellow t band ER L with static positioning R shoulder, with B humerus against trunk, mass practice Side lying L for R shoulder ER with towel under axilla, mass practice, really cued verbally and manually for R shoulder depression to avoid impinging ant/superior GH jt Side lying L for R shoulder flexion, therapist assisted, manually depressed R scapula to achieve better movement pattern Standing for scaption , pt grasping sink and leaning posteriorly to distract R shoulder and flexing trunk to achieve gentle stretch into scaption Standing L forearm on counter for R shoulder extension, again emphasis on avoiding shrugging, engaging proper scapular retraction while moving R shoulder, advised pt to trial at home some this week.   12/14/2023 THERAPEUTIC EXERCISE: To improve ROM and flexibility.  Demonstration, verbal and tactile cues throughout for technique.  Pulleys: Flexion & Scaption x 3 min each - emphasizing submax effort at ~25% effort with eccentric lowering  Supine R should er flexion PROM with PT inhibiting UT/shoulder shrug and cues to  minimize muscle activation/guarding - pain increased when pt attempting AAROM Supine R shoulder scaption P/AAROM - better tolerance today Supine R shoulder IR/ER P/AAROM in scapular plane S/L R shoulder flexion PROM progressing to AAROM within pain free ROM  MANUAL THERAPY: To promote normalized muscle tension, improved flexibility, improved joint mobility, increased ROM, pain modulation, and reduced pain utilizing joint mobilization, connective tissue massage, therapeutic massage, manual TP therapy, and myofascial release. Supine gentle R shoulder oscillation and grade I-II inferior glide for pain relief/muscle relxation R shoulder incisional scar massage Supine and S/L STM/DTM and gentle  manual TPR to R infraspinatus and teres group  L S/L R scapular mobilization  MODALITIES:  TENS unit to R shoulder complex - intensity to pt tolerance x 20 min + moist heat pack for pain relief and decreased muscle guarding  SELF CARE: Provided education on home TENS unit set-up and use and continued limited active R UE use until pain better controlled.    12/12/2023  THERAPEUTIC EXERCISE: To improve ROM and flexibility.  Demonstration, verbal and tactile cues throughout for technique.  Pulleys: Flexion & Scaption x 3 min each - reviewed submax effort at ~25% effort with eccentric lowering  Supine R shoulder flexion and scaption P/AAROM with PT inhibiting UT/shoulder shrug   MANUAL THERAPY: To promote normalized muscle tension, improved flexibility, improved joint mobility, increased ROM, and pain modulation utilizing joint mobilization, connective tissue massage, therapeutic massage, and manual TP therapy.  Supine gentle R shoulder oscillation and grade I-II inferior glide for pain relief/muscle relxation Supine with towel roll supporting R post humerus, for gentle ROM R shoulder, with intermittent retrograde massage, STM/DTM and manual TPR to R UT, LS, pecs, subscapularis, teres group and infrapinatus  L  S/L R scapular mobilization  MODALITIES:  TENS unit to R shoulder complex - intensity to pt tolerance x 20 min + moist heat pack for pain relief and decreased muscle guarding  SELF CARE: Provided education on pain management options.  Provided education on home TENS unit options including proper set up, precautions and recommended home frequency for use.   12/07/23:  Shoulder pulley flex, scaption, 2 min each.  The patient has been eccentrically lowering her R shoulder with the pulleys, advised her to reduce her engagement of musculature with eccentric lowering to 25% vs a maximal contraction that she has been utilizing. Supine with towel roll supporting R post humerus, for gentle ROM R shoulder, with intermittent retrograde massage.  Assisted R scapular/serratus punches 10x Supine chest press with yard stick 10x Supine manually resisted isometrics for R shoulder ER 10 reps, 5 sec holds Side lying L for R shoulder isometric adduction, towel roll in axilla,  5 sec holds, 10 reps  Side lying L for R shoulder ER 10 reps , pt reported soreness so stopped Side lying for assisted R shoulder flexion 10 reps, therapist supported under R hand    11/30/23:  Reassessed for 10th visit progress report Progressed therex according to pts tolerance and to protocol, now 12 weeks post op Shoulder pulley x 3 min flexion and scaption, slow Supine for serratus punches, initially with therapist supporting R arm and then with yard stick Supine for chest presses with yard stick Supine L shoulder ER with yellow theraband, stabilizing, static position R shoulder Side lying L for R shoulder ER Side lying L for R shoulder flexion Updated HEP below   11/27/2023 THERAPEUTIC EXERCISE: To improve strength, endurance, ROM, and flexibility.  Demonstration, verbal and tactile cues throughout for technique.  Pulleys: Flexion & Scaption x 3 min each  Standing R shoulder flexion/scaption wall slides x 5 - AAROM required  with L hand at elbow- limited today Standing R shoulder flexion counter slides with 2# weight on washcloth 2 x 10 Standing R shoulder scaption counter slides with 2# weight on washcloth 2 x 10 Seated hand clasped AAROM shoulder flexion x 5  NEUROMUSCULAR RE-EDUCATION: To improve coordination, kinesthesia, and posture. R shoulder flexion YTB reactive isometric step-outs x 10 (neutral shoulder with elbow flexed to 90) R shoulder extension YTB reactive isometric step-outs x 10 (  neutral shoulder with elbow flexed to 90) - cues for scapular retraction activation to stabilize shoulder R shoulder IR YTB reactive isometric step-outs x 10 (neutral shoulder with elbow flexed to 90) R shoulder ER YTB reactive isometric step-outs x 10 (neutral shoulder with elbow flexed to 90) - cues for scapular retraction activation to stabilize shoulder   11/23/2023 THERAPEUTIC EXERCISE: To improve strength, endurance, ROM, and flexibility.  Demonstration, verbal and tactile cues throughout for technique.  Pulleys: Flexion & Scaption x 3 min each  Standing R shoulder flexion/scaption wall slides x 10 - AAROM required with L hand at elbow Standing R shoulder flexion counter slides with 2# weight on washcloth 2 x 10 Standing R shoulder scaption counter slides with 2# weight on washcloth 2 x 10 Seated B shoulder orange Pball flexion & scaption walk-out x 10 each (attempted initially on wall but deferred d/t excessive R shoulder shrugging  NEUROMUSCULAR RE-EDUCATION: To improve coordination, kinesthesia, and posture. R shoulder flexion YTB reactive isometric step-outs x 5 (neutral shoulder with elbow flexed to 90) R shoulder extension YTB reactive isometric step-outs x 5 (neutral shoulder with elbow flexed to 90) - cues for scapular retraction activation to stabilize shoulder R shoulder IR YTB reactive isometric step-outs x 5 (neutral shoulder with elbow flexed to 90) R shoulder ER YTB reactive isometric step-outs x  5 (neutral shoulder with elbow flexed to 90) - cues for scapular retraction activation to stabilize shoulder   11/21/2023  THERAPEUTIC EXERCISE: To improve strength, endurance, ROM, and flexibility.  Demonstration, verbal and tactile cues throughout for technique. Pulleys: Flexion & Scaption x 3 min each  Supine for gentle R shoulder PROM/stretching all planes Supine flexed elbow R shoulder flexion/scaption (uppercut) AA/AROM 2 x 10 - somewhat better tolerance for AROM but still slight guiding from PT initially with motion  MANUAL THERAPY: To promote normalized muscle tension, improved flexibility, improved joint mobility, and reduced pain utilizing connective tissue massage, therapeutic massage, manual TP therapy, and scar mobilization.  STM/XFM to R shoulder surgical incision Supine gentle R shoulder oscillation and grade I-II inferior glide for pain relief/muscle relxation  NEUROMUSCULAR RE-EDUCATION: To improve coordination, kinesthesia, and posture. S/L R shoulder ER maintaining mini-pillow under axilla and R elbow at 90 degrees 2 x 10 - VC & TC for scapular engagement, avoiding shoulder shrug S/L R shoulder flexion - initially attempted with elbow straight but transitioned to R elbow at 90 degrees x 10  SELF CARE:  Provided education on sleeping position and use of pillow(s) to support R arm.    11/17/2023  THERAPEUTIC EXERCISE: To improve ROM and flexibility.  Demonstration, verbal and tactile cues throughout for technique.  Pulleys: Flexion & Scaption x 3 min each  Supine for gentle R shoulder PROM/stretching all planes Supine flexed elbow R shoulder flexion/scaption (uppercut) AA/AROM 2 x 10 - AAROM d/t increased discomfort with AROM  MANUAL THERAPY: To promote normalized muscle tension, improved flexibility, improved joint mobility, and reduced pain utilizing connective tissue massage, therapeutic massage, manual TP therapy, and scar mobilization.  STM/XFM to R shoulder  surgical incision STM/DTM and manual TPR to R pecs  NEUROMUSCULAR RE-EDUCATION: To improve coordination, kinesthesia, and posture. S/L R shoulder ER maintaining towel roll under axilla and R elbow at 90 degrees 2 x 10 - VC & TC for scapular engagement S/L R shoulder flexion - initially attempted with elbow straight but transitioned to R elbow at 90 degrees x 10 Seated YTB scap retraction + B shoulder row x 10 Seated YTB  scap retraction + B shoulder extension stopping shy of neutral x 10   11/14/23: Pulleys 3 min flexion B Supine for gentle stretching R shoulder all planes Supine with R humerus supported in line with trunk for manually resisted R shoulder isometric inv/ev mass practice Side lying L for R shoulder ER maintaining towel roll under axilla and R elbow at 90 degrees Standing with R elbow supported on counter, pulling towel with 6# on towel from flexed position to neutral, then advance to sitting in chair for yellow t band rows, needed frequent cues to avoid R shoulder extension past trunk, had pt slide back in chair so that the seat back blocked her from extending R shoulder    11/08/2023  THERAPEUTIC EXERCISE: To improve ROM and flexibility.  Demonstration, verbal and tactile cues throughout for technique.  Pulleys: Flexion x 3 min, Scaption x 3 min (scaption better tolerated today) Standing counter wash AAROM LUE: Flexion x 20 Scaption x 20 R shoulder PROM within pain tolerance for flexion, scaption, IR and ER Measured PROM Shoulder isometrics: flex, abd, ER, ext 5x5 Standing YTB scap retraction + B shoulder row- clicking clicking at first but after readjusting no clicking  Standing YTB scap retraction + B shoulder extension stopping shy of neutral x 10   11/06/2023  THERAPEUTIC EXERCISE: To improve ROM and flexibility.  Demonstration, verbal and tactile cues throughout for technique.  Pulleys: Flexion x 3 min, Scaption x 3 min (scaption better tolerated today) Seated R  shoulder AAROM with Swiffer: Flexion 2 x 10 Scaption 2 x 10 R shoulder PROM by PT within pain tolerance for flexion, scaption, IR and ER  Hooklying R shoulder AAROM with cane: Flexion 2 x 10 Scaption x 10 - pt needing PT guidance/assistance for proper movement pattern  NEUROMUSCULAR RE-EDUCATION: To improve coordination, kinesthesia, posture, and proprioception. R shoulder rhythmic stabilization at 90 flexion (1-finger perturbations) 2 x 15-20 sec R shoulder protraction AAROM with wand 2 x 10 Standing YTB scap retraction + B shoulder row x 10 Standing YTB scap retraction + B shoulder extension stopping shy of neutral x 10   11/02/2023  THERAPEUTIC EXERCISE: To improve strength, endurance, ROM, and flexibility.  Demonstration, verbal and tactile cues throughout for technique.  Pulleys: Flexion x 3 min, Scaption x 1.5 min (discontinued d/t increasing discomfort with scaption) Table slides standing at counter top:  Flexion 2 x 10 Scaption 2 x 10 Seated R shoulder AAROM with Swiffer: Flexion 2 x 10 Scaption 2 x 10 R shoulder PROM by PT within pain tolerance for flexion, scaption, IR and ER (ER limited to ~45 per protocol) Standing R shoulder submax (~25% effort) isometrics into wall 5 x 5 - flexion, abduction, extension, IR & ER Standing scapular retraction 10 x 5  SELF CARE:  Yellow Theraputty provided for home grip strengthening   10/30/2023 SELF CARE:  Reviewed eval findings and role of PT in addressing identified deficits as well as instruction in initial HEP (see below).    PATIENT EDUCATION:  Education details: HEP review and cautioned patient to adjust reps/set to avoid increased pain and pushing past point of fatigue/loss of control  Person educated: Patient Education method: Explanation, Demonstration, Verbal cues, and Tactile cues Education comprehension: verbalized understanding, returned demonstration, verbal cues required, tactile cues required, and needs  further education  HOME EXERCISE PROGRAM: Access Code: [email or web address] URL: [email or web address] Date: 11/30/2023 Prepared by: Amy Speaks  Exercises - Sidelying Shoulder External Rotation  - 1 x daily - 3 x weekly -  3 sets - 10 reps - Sidelying Shoulder Flexion 15 Degrees  - 1 x daily - 3 x weekly - 2 sets - 10 reps - 3 sec hold - Seated Shoulder Extension and Scapular Retraction with Resistance  - 1 x daily - 3 x weekly - 2 sets - 10 reps - 3-5 sec hold - Seated Shoulder Row with Anchored Resistance  - 1 x daily - 3 x weekly - 2 sets - 10 reps - 3-5 hold hold - Shoulder Scar Massage  - 1-2 x daily - 7 x weekly - 1-2 min hold - Supine Shoulder Protraction with Dowel  - 1 x daily - 7 x weekly - 3 sets - 10 reps - Supine Shoulder Press with Dowel  - 1 x daily - 7 x weekly - 3 sets - 10 reps - Supine Shoulder External Rotation with Resistance  - 1 x daily - 7 x weekly - 3 sets - 10 reps   ASSESSMENT:  CLINICAL IMPRESSION: Montina has been reporting pain has been much better controlled since 12/26/23 visit and has been able to more actively participate with therapy exercises and activities over the past few weeks w/o increased pain, however she arrives to PT today reporting increased pain over the past 2 days with unknown trigger, but potentially due to increased activity when having guests over the weekend.  Several TPs identified throughout her R shoulder musculature which were addressed with MT but still with pain limiting ROM.  Amyra also noted to be utilizing increased shoulder shrug/substitution with ROM efforts today which she had been better able to avoid in recent visits, although she required significant cues and facilitation/guidance for good shoulder mechanics.  Due to current exacerbation of pain, ROM and strength more limited today with QuickDASH also demonstrating slight decline from most recent assessment.  Nene will benefit from continued skilled PT to address ongoing R shoulder  ROM, glenohumeral control and strength deficits to functional UE use and activity tolerance with decreased pain interference.  Given current re-exacerbation of pain and continued limited functional use of R arm, anticipate Beulah will require recertification at the end of her current certification period on 01/22/24.  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 Pt reports that she has orders to limit R arm use to waist level only, avoid reaching overhead at this time. She is having difficulty with forward and overhead reaching for items, opening cans w/ R hand, washing hair, and driving. She states that she has learned ways to compensate using her left arm even though she is R hand dominant. Today's assessment was done with all items remaining within patient's tolerance. PROM revealed limitations in flexion, IR, ER, and abd as movement was limited by both pain and muscular guarding. Pt had difficulty fully relaxing the R arm for PROM today and displayed hesitancy to movement beyond waist side. Pt was educated on the importance of taking recovery slow at this point (as advised) and avoid heavy lifting and resistance training after asking to try bicep curls with a small weight at home. Pt is aware that at this stage, it is important to focus of restoring as much ROM as she can and gradually progressing towards more strengthening activities with therapy. Pt scored a 79.5% on the QuickDASH representing severe disability of the UE in day to day activities.  Gloria will benefit from skilled PT intervention to address current deficits to improve functional ability, mobility, and activity tolerance w/ dec'd pain interference.   OBJECTIVE IMPAIRMENTS: decreased activity tolerance, decreased endurance, decreased knowledge of condition, decreased mobility, decreased ROM, decreased  strength, increased fascial restrictions, impaired perceived functional ability, increased muscle spasms, impaired UE functional use, postural dysfunction, and pain.   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   REHAB POTENTIAL: Good  CLINICAL DECISION MAKING: Evolving/moderate complexity  EVALUATION COMPLEXITY: Moderate  GOALS: Goals reviewed with patient? Yes  SHORT TERM GOALS: Target date: 12/11/2023    Patient will be independent with initial HEP.  Baseline: Goal status: MET - 11/16/23  2.  Patient will report at least 25% improvement in R shoulder pain to improve QoL. Baseline: Worst 5-7/10 11/17/23 - pt noting shoulder more irritable over past week 12/12/23 - R shoulder more irritable (3-4/10) Goal status: MET - 01/02/24 - pain better controlled with minimal to no pain over past 2 weeks  3.  Patient will improve R shoulder flexion PROM to 90 or better to aid in difficulty with forward reaching activities.  Baseline: limited by pain ~60 deg of flexion  Goal status: MET - 11/08/23 - no pain  4.  Patient will decrease her QuickDASH score by at least 15% to decrease severity of disability.  Baseline: 79.5% Goal status:  MET - 11/30/23 - QuickDASH Score: 45.5 / 100 = 45.5 %  LONG TERM GOALS: Target date: 01/22/2024  Patient will be independent with advanced HEP.  Baseline:  12/26/23 - cautioned pt to ease back into shoulder pulleys & scapular exercises with HEP, avoiding  increased pain and being aware of fatigue and adjusting reps/sets accordingly Goal status: IN PROGRESS - 01/09/24 - cautioned pt to back off on HEP until current pain exacerbation subsides, avoiding increased pain and being aware of fatigue and adjusting reps/sets accordingly  2.  Patient will decrease her QuickDASH score to by at least 25-30% to improve function and activity tolerance d/t disability. Baseline: 79.5% 11/30/23: 45.5 / 100 = 45.5 % Goal status: MET - 01/09/24 - 47.7 / 100 = 47.7 %  3.  Patient will be able to perform overhead activities such as washing hair, retrieving items from cabinets to improve independent function at home. Baseline: pt is having difficulty with above activities  11/30/23: progressing not met Goal status: IN PROGRESS - 01/09/24 - was progressing better until flare-up today  4.  Patient will report at least 50% improvement in R shoulder pain to improve QoL.  Baseline: 5-7/10 11/30/23:  0 at rest, up to 3 with use , met so far 12/14/23: pain has been increased over past few visits (3-4/10 today) 01/02/24: pain better controlled with minimal to no pain over past 2 weeks Goal status: IN PROGRESS - 01/09/24 - pt reports 50% improvement overall but currently experiencing another flare-up of unknown cause  5.  Patient will obtain at least 3+/5 MMT of UE for available range to increase activity tolerance.   Baseline: Unable to assess 11/30/23: progressing Goal status: PARTIALLY MET - 01/09/24 - met for IR, ER and  extension   PLAN: PT FREQUENCY: 2x/week  PT DURATION: 12 weeks  PLANNED INTERVENTIONS: 02835- PT Re-evaluation, 97750- Physical Performance Testing, 97110-Therapeutic exercises, 97530- Therapeutic activity, [id number]- Neuromuscular re-education, 97535- Self Care, 02859- Manual therapy, [phone]- Gait training, [phone]- Electrical stimulation (unattended), [phone]- Ultrasound, 02987- Traction (mechanical), [phone] (1-2 muscles), [id number] (3+ muscles)- Dry Needling,  Patient/Family education, Taping, Joint mobilization, Cryotherapy, and Moist heat  PLAN FOR NEXT SESSION: Resume gentle R shoulder P/AA/AROM within pain free motion and watching closely for substitution related to fatigue; carefully review/revise HEP as indicated with only minimal changes at a time to avoid re-irritation of R shoulder; may use Reverse Total Shoulder Arthroplasty for Fracture Protocol for guidance (Sx 09/07/23) - post-op week #18 as of 01/11/24   Elijah CHRISTELLA Hidden, PT 01/09/2024, 12:59 PM

## 2024-01-10 DIAGNOSIS — Z96611 Presence of right artificial shoulder joint: Secondary | ICD-10-CM | POA: Diagnosis not present

## 2024-01-11 ENCOUNTER — Ambulatory Visit

## 2024-01-11 DIAGNOSIS — M25511 Pain in right shoulder: Secondary | ICD-10-CM | POA: Diagnosis not present

## 2024-01-11 DIAGNOSIS — M25611 Stiffness of right shoulder, not elsewhere classified: Secondary | ICD-10-CM

## 2024-01-11 DIAGNOSIS — M6281 Muscle weakness (generalized): Secondary | ICD-10-CM | POA: Diagnosis not present

## 2024-01-11 DIAGNOSIS — R252 Cramp and spasm: Secondary | ICD-10-CM

## 2024-01-11 DIAGNOSIS — G8929 Other chronic pain: Secondary | ICD-10-CM | POA: Diagnosis not present

## 2024-01-11 DIAGNOSIS — S46211A Strain of muscle, fascia and tendon of other parts of biceps, right arm, initial encounter: Secondary | ICD-10-CM | POA: Diagnosis not present

## 2024-01-11 NOTE — Therapy (Signed)
 OUTPATIENT PHYSICAL THERAPY TREATMENT     Patient Name: Faith Massey MRN: [id number] DOB:08/15/1945, 78 y.o., female Today's Date: 01/11/2024  END OF SESSION:  PT End of Session - 01/11/24 1150     Visit Number 20    Date for Recertification  01/22/24    Authorization Type [employer]    Progress Note Due on Visit 20    PT Start Time 1150    Activity Tolerance Patient tolerated treatment well;Patient limited by fatigue    Behavior During Therapy [facility] for tasks assessed/performed              Past Medical History:  Diagnosis Date   Arthritis    Breast CA (HCC)    Chest mass 03/28/2022   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 10/16/2012   Pneumonia    Pre-diabetes    Prediabetes 05/25/2022   Pyelonephritis 11/10/2022   Right knee injury 02/14/2017   S/P radiation therapy 05/09/2023   S/P Robotic Assisted Right Video Thoracoscopy with resection of Thymus 04/25/2022   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   Past Surgical History:  Procedure Laterality Date   ABDOMINAL HYSTERECTOMY  03/22/2003   BACK SURGERY  03/21/1998   BREAST SURGERY     reconstruction   colon susrgery      CYSTOSCOPY WITH STENT PLACEMENT N/A 02/08/2023   Procedure: POSSIBLE CYSTOSCOPY WITH URETERAL STENT PLACEMENT;  Surgeon: Alvaro Ricardo KATHEE Mickey., MD;  Location: [facility];  Service: Urology;  Laterality:  N/A;   LEFT HEART CATH AND CORONARY ANGIOGRAPHY N/A 07/20/2023   Procedure: LEFT HEART CATH AND CORONARY ANGIOGRAPHY;  Surgeon: Verlin Lonni BIRCH, MD;  Location: [facility];  Service: Cardiovascular;  Laterality: N/A;   MASTECTOMY Bilateral 03/21/1993   RESECTION OF A THYMOMA     04-25-22   REVISION TOTAL SHOULDER TO REVERSE TOTAL SHOULDER Right 09/07/2023   Procedure: REVISION, REVERSE TOTAL ARTHROPLASTY, SHOULDER;  Surgeon: Melita Drivers, MD;  Location: [facility];  Service: Orthopedics;  Laterality: Right;  HEMI ARTHROPLASTY, BONEGRAFT GLENOID   right total shoulder     Patient Active Problem List   Diagnosis Date Noted   S/P shoulder hemiarthroplasty, 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   Abnormal EKG 07/21/2023   Chest pain 07/20/2023   Preoperative cardiovascular examination 07/19/2023   Arthritis    Breast CA (HCC)    Hyperlipidemia    Laceration of muscle, fascia and tendon of long head of biceps, unspecified arm, initial encounter    Myasthenia gravis (HCC)    Pneumonia    S/P radiation therapy 05/09/2023   Hoarseness of voice 05/09/2023   Diverticular disease 02/08/2023   History of pulmonary embolism 12/22/2022   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   Pyelonephritis 11/10/2022   Prediabetes 05/25/2022   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   S/P Robotic Assisted Right Video Thoracoscopy with resection of Thymus 04/25/2022  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   Myasthenic syndrome (HCC) 04/12/2022   CVA (cerebral vascular accident) (HCC) 03/28/2022   HTN (hypertension) 03/28/2022   Chest mass 03/28/2022   Right knee injury 02/14/2017   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 10/16/2012   Osteoporosis 10/12/2012    PCP: Watt Harlene BROCKS, MD   REFERRING PROVIDER: Melita Drivers, MD   REFERRING DIAG: [phone] (ICD-10-CM) - Presence of right artificial shoulder joint   THERAPY DIAG:  Acute pain of right shoulder  Stiffness of right shoulder, not elsewhere classified  Muscle  weakness (generalized)  Cramp and spasm  RATIONALE FOR EVALUATION AND TREATMENT: Rehabilitation  ONSET DATE: Post-Op R TSA revision/conversion to hemi-arthroplasty on 09/07/2023   NEXT MD VISIT:  ~June 2026   SUBJECTIVE:                                                                                                                                                                                      SUBJECTIVE STATEMENT:  Patient reports MD wants her to work on wrapping up with PT over the remaining visit in her POC with focus on education in establishing an appropriate progression of home exercises.  She will f/u with MD at her 1 year surgical anniversary or sooner if there is a change in her status. She states she is feeling good with no real pain today.  Hand dominance: Right  PERTINENT HISTORY: MG, h/o radiation (March and May 2024) for mediastinal mass, Breast CA, HTN, OP, R TSA (09/07/23)  PAIN:  Are you having pain? No and Yes: NPRS scale: 0/10 Pain location: R anterior upper shoulder, scapular region upon waking in the morning   PRECAUTIONS:  Shoulder: physical therapist referral - Evaluate and treat per protocol status post revision right reverse total shoulder arthroplasty to CTA hemiarthroplasty, DOS 09/07/23 Patient unfortunately had a fracture at the base of the glenosphere making it unstable and requiring removal and conversion to hemiarthroplasty. Please adjust her physical therapy to be done under a very slow and steady and gradual advance of range of motion and gradual use of the right upper extremity within her pain tolerance. Please call me for any additional questions  RED FLAGS: None   WEIGHT BEARING RESTRICTIONS: No  FALLS:  Has patient fallen in last 6 months? Yes. Number of falls a week and a half ago, feel onto buttock while pulling weeds out of yard   LIVING ENVIRONMENT: Lives with: lives alone Lives in: House/apartment Stairs: No Has following  equipment at home: Single point cane, shower chair, Grab bars, and walking stick   OCCUPATION: Retired   PLOF: Independent with household mobility with device and  Leisure: watching tv, reading, walking her dog, social gatherings w/ friends to play card games   PATIENT GOALS: Get my shoulder to functioning, lifting shoulder, bearing weight   OBJECTIVE:  Note: Objective measures were completed at Evaluation unless otherwise noted.  DIAGNOSTIC FINDINGS:  09/20/2022: R shoulder XR  Results: Degenerative changes of the before meals and glenohumeral joints. No other acute findings.   PATIENT SURVEYS :  QuickDASH Score: 79.5 / 100 = 79.5 %  COGNITION: Overall cognitive status: Within functional limits for tasks assessed     SENSATION: WFL  POSTURE: Rounded shoulders, fwd head   UPPER EXTREMITY ROM:  LUE: Grossly WFL   Passive ROM Right eval R 11/02/23 R 11/08/23 R 11/27/23  R 11/30/23   Shoulder flexion Limited, p! Less than 90 (around 60) 84 90 120 120  Shoulder extension       Shoulder abduction Limited less than 90 74 scaption 80 scaption 100 100  Shoulder adduction       Shoulder internal rotation Limited p! 35 at 30 scaption     Shoulder external rotation Limitied p! 45 at 30 scaption 50  64 73  (Blank rows = not tested)  Active ROM R 11/30/23 R 01/09/24  Shoulder flexion 56 62  Shoulder extension  38  Shoulder abduction  60  Shoulder adduction    Shoulder internal rotation  61 - FIR to buttock  Shoulder external rotation  51   (Blank rows = not tested)   UPPER EXTREMITY MMT:  MMT Right eval Left eval R 11/30/23 R 01/09/24 L 01/09/24  Shoulder flexion  4- 2 2 4+  Shoulder extension    4 4+  Shoulder abduction  4+  2 4+  Shoulder adduction       Shoulder internal rotation  4-  4- 4  Shoulder external rotation  4- 3- 3+ 4-  Middle trapezius       Lower trapezius       Elbow flexion  5     Elbow extension  4+     Wrist flexion       Wrist extension       Wrist  ulnar deviation       Wrist radial deviation       Wrist pronation       Wrist supination       Grip strength (lbs)       (Blank rows = not tested)  11/30/23:  R shoulder flexion active to 56 degrees R shoulder ER 3-/5  SHOULDER SPECIAL TESTS: Not Performed   JOINT MOBILITY TESTING:  Not Performed   PALPATION:  TTP: R ant glenoid, collar bone area (tender, not painful)                                                                                                                              TREATMENT DATE:   01/11/24 THERAPEUTIC EXERCISE: To improve strength, endurance, ROM, and flexibility.  Demonstration, verbal and tactile cues throughout for technique.  Seated pulleys: Flexion & Scaption x 3 min each - emphasizing submax effort at ~25% effort with eccentric lowering, avoiding shoulder shrug and forcing painful ROM THERAPEUTIC ACTIVITIES: To improve functional performance.  Demonstration, verbal and tactile cues throughout for technique. R UE reach to 6 inch tall yoga ball x 10 flexion 1lb  R UE reach to 6 inch tall yoga ball x 10 scaption 1lb Standing scap retraction YTB x 10 Standing shoulder ext YTB x 10 S/L R shoulder ER no resistnce towel at side x 10     01/08/24 THERAPEUTIC EXERCISE: To improve ROM and flexibility.  Demonstration, verbal and tactile cues throughout for technique.  Seated pulleys: Flexion & Scaption x 3 min each - emphasizing submax effort at ~25% effort with eccentric lowering, avoiding shoulder shrug and forcing painful ROM  MANUAL THERAPY: To promote normalized muscle tension, improved flexibility, improved joint mobility, increased ROM, and reduced pain utilizing joint mobilization, connective tissue massage, therapeutic massage, manual TP therapy, and myofascial release.  Supine gentle R shoulder oscillation and grade I-II inferior glide for pain relief/muscle relxation STM/DTM and manual TPR to R UT, LS, infraspinatus, teres group and  subscapularis  THERAPEUTIC ACTIVITIES: To improve functional performance.  Demonstration, verbal and tactile cues throughout for technique. R shoulder ROM assessment UE MMT QuickDASH: 47.7 / 100 = 47.7 %  NEUROMUSCULAR RE-EDUCATION: To improve coordination, kinesthesia, and posture. Seated PT assisted AAROM/MWM in R shoulder flexion and scaption to reduce shoulder shrug and promote improved scapular and GH coordination   01/04/24 THERAPEUTIC EXERCISE: To improve strength, endurance, ROM, and flexibility.  Demonstration, verbal and tactile cues throughout for technique.  Seated pulleys: Flexion & Scaption x 3 min each - emphasizing submax effort at ~25% effort with eccentric lowering  S/L R shoulder ER AROM x 10 with 3 oz putty S/L R shoulder flexion AAROM x 10; AROM x 5- fatigued  THERAPEUTIC ACTIVITIES: To improve functional performance.  Demonstration, verbal and tactile cues throughout for technique.  Functional reaches R shoulder flexion with hand to 9 yoga block lifting 3 oz putty tub - performed to onset of fatigue/increased substitution (shoulder hike) Functional reaches R shoulder scaption with hand to 9 yoga block lifting 3 oz putty tub x 5 - fatigued  NEUROMUSCULAR RE-EDUCATION: To improve coordination and proprioception Standing YTB scap retraction/depression + B shoulder row x 10    12/26/2023 THERAPEUTIC EXERCISE: To improve strength, endurance, ROM, and flexibility.  Demonstration, verbal and tactile cues throughout for technique.  Seated pulleys: Flexion & Scaption x 3 min each - emphasizing submax effort at ~25% effort with eccentric lowering  S/L R shoulder ER AROM x 5, lightly resisted AROM with 3 oz putty tub to onset of fatigue (~7 reps) S/L R shoulder flexion AA/AROM x 10   THERAPEUTIC ACTIVITIES: To improve functional performance.  Demonstration, verbal and tactile cues throughout for technique.  Functional reaches R shoulder flexion with hand to 9 yoga block  lifting 3 oz putty tub - performed to onset of fatigue/increased substitution (shoulder hike) Functional reaches R shoulder scaption with hand to 9 yoga block lifting 3 oz putty tub - performed to onset of fatigue (feeling of clunking in shoulder)  NEUROMUSCULAR RE-EDUCATION: To improve coordination, kinesthesia, posture, and proprioception. Supine R shoulder rhythmic stabilization IR/ER with shoulder 30 degrees from trunk and elbow at 90 degrees on towel roll Supine R scapular punches with PT supporting arm lightly  Supine R shoulder CW/CCW circles x  10 each with PT supporting arm lightly Standing YTB scap retraction/depression + B shoulder extension stopping shy of neutral x 10 Standing YTB scap retraction/depression + B shoulder row x 10 Standing scapular press-ups leaning lightly on elevated hi/low table with elbows extended        12/28/23:  Seated for shoulder pulley, PROM on R, flex and scaption 2-3 min each Supine for retrograde massage, and gentle AAROM  Supine for various rhythmic stabilization ex: With towel roll under R elbow for shoulder IR/ER elbow 90 degrees, shoulder 30 degrees from trunk Scapular punches with therapist supporting arm lightly in supine PNF sword pulls from L ant hip to R shoulder scaption position, therapist providing manual support and resistance.   Seated for playground ball squeeze, engaging B shoulder depression and small forward shoulder flexion Seated for manually assisted R UE horizontal adduction/ abduction , gentle, small range Seated rows with yellow theraband, small movements, pt able to maintain B shoulder depression with the rows Seated R triceps extension therapist providing manual resistance   12/26/2023 THERAPEUTIC EXERCISE: To improve strength, endurance, ROM, and flexibility.  Demonstration, verbal and tactile cues throughout for technique.  Pulleys: Flexion & Scaption x 3 min each - emphasizing submax effort at ~25% effort with eccentric  lowering  Gentle R UT and LS stretches x 30 each  MANUAL THERAPY: To promote normalized muscle tension, improved flexibility, improved joint mobility, increased ROM, and pain modulation utilizing connective tissue massage, therapeutic massage, and manual TP therapy.  STM/DTM and manual TPR to R UT and LS  THERAPEUTIC ACTIVITIES: To improve functional performance.  Demonstration, verbal and tactile cues throughout for technique.  R shoulder AAROM wall slides with slight support from L hand - cues to avoid shoulder shrug - fatigues quickly Functional reaches R shoulder flexion with hand to 14 box placed on counter - increased shoulder shrug observed, therefore reduced height to 9 stool on counter and performed in flexion and scaption to point of fatigue/increased substitution  NEUROMUSCULAR RE-EDUCATION: To improve coordination, kinesthesia, posture, proprioception, and amplitude of movement.  PT providing tactile cues with fingertips on medial border of scapula. Scap retraction + small ROM B shoulder row (emphasis on scapular movement/activation) 2 x 5 Scap retraction + small ROM B shoulder extension +emphasis on scapular movement/activation) 2 x 5   12/21/23  Therapeutic activity: UBE 3 min F, attempted backward but unable to avoid shrugging R shoulder so discontinued Supine for gentle PROM all planes R shoulder  Supine with towel roll under humerus, yellow t band ER L with static positioning R shoulder, with B humerus against trunk, mass practice Side lying L for R shoulder ER with towel under axilla, mass practice, really cued verbally and manually for R shoulder depression to avoid impinging ant/superior GH jt Side lying L for R shoulder flexion, therapist assisted, manually depressed R scapula to achieve better movement pattern Standing for scaption , pt grasping sink and leaning posteriorly to distract R shoulder and flexing trunk to achieve gentle stretch into scaption Standing L  forearm on counter for R shoulder extension, again emphasis on avoiding shrugging, engaging proper scapular retraction while moving R shoulder, advised pt to trial at home some this week.   12/14/2023 THERAPEUTIC EXERCISE: To improve ROM and flexibility.  Demonstration, verbal and tactile cues throughout for technique.  Pulleys: Flexion & Scaption x 3 min each - emphasizing submax effort at ~25% effort with eccentric lowering  Supine R should er flexion PROM with PT inhibiting UT/shoulder shrug and  cues to minimize muscle activation/guarding - pain increased when pt attempting AAROM Supine R shoulder scaption P/AAROM - better tolerance today Supine R shoulder IR/ER P/AAROM in scapular plane S/L R shoulder flexion PROM progressing to AAROM within pain free ROM  MANUAL THERAPY: To promote normalized muscle tension, improved flexibility, improved joint mobility, increased ROM, pain modulation, and reduced pain utilizing joint mobilization, connective tissue massage, therapeutic massage, manual TP therapy, and myofascial release. Supine gentle R shoulder oscillation and grade I-II inferior glide for pain relief/muscle relxation R shoulder incisional scar massage Supine and S/L STM/DTM and gentle manual TPR to R infraspinatus and teres group  L S/L R scapular mobilization  MODALITIES:  TENS unit to R shoulder complex - intensity to pt tolerance x 20 min + moist heat pack for pain relief and decreased muscle guarding  SELF CARE: Provided education on home TENS unit set-up and use and continued limited active R UE use until pain better controlled.    12/12/2023  THERAPEUTIC EXERCISE: To improve ROM and flexibility.  Demonstration, verbal and tactile cues throughout for technique.  Pulleys: Flexion & Scaption x 3 min each - reviewed submax effort at ~25% effort with eccentric lowering  Supine R shoulder flexion and scaption P/AAROM with PT inhibiting UT/shoulder shrug   MANUAL THERAPY: To promote  normalized muscle tension, improved flexibility, improved joint mobility, increased ROM, and pain modulation utilizing joint mobilization, connective tissue massage, therapeutic massage, and manual TP therapy.  Supine gentle R shoulder oscillation and grade I-II inferior glide for pain relief/muscle relxation Supine with towel roll supporting R post humerus, for gentle ROM R shoulder, with intermittent retrograde massage, STM/DTM and manual TPR to R UT, LS, pecs, subscapularis, teres group and infrapinatus  L S/L R scapular mobilization  MODALITIES:  TENS unit to R shoulder complex - intensity to pt tolerance x 20 min + moist heat pack for pain relief and decreased muscle guarding  SELF CARE: Provided education on pain management options.  Provided education on home TENS unit options including proper set up, precautions and recommended home frequency for use.   12/07/23:  Shoulder pulley flex, scaption, 2 min each.  The patient has been eccentrically lowering her R shoulder with the pulleys, advised her to reduce her engagement of musculature with eccentric lowering to 25% vs a maximal contraction that she has been utilizing. Supine with towel roll supporting R post humerus, for gentle ROM R shoulder, with intermittent retrograde massage.  Assisted R scapular/serratus punches 10x Supine chest press with yard stick 10x Supine manually resisted isometrics for R shoulder ER 10 reps, 5 sec holds Side lying L for R shoulder isometric adduction, towel roll in axilla,  5 sec holds, 10 reps  Side lying L for R shoulder ER 10 reps , pt reported soreness so stopped Side lying for assisted R shoulder flexion 10 reps, therapist supported under R hand    11/30/23:  Reassessed for 10th visit progress report Progressed therex according to pts tolerance and to protocol, now 12 weeks post op Shoulder pulley x 3 min flexion and scaption, slow Supine for serratus punches, initially with therapist supporting R  arm and then with yard stick Supine for chest presses with yard stick Supine L shoulder ER with yellow theraband, stabilizing, static position R shoulder Side lying L for R shoulder ER Side lying L for R shoulder flexion Updated HEP below   11/27/2023 THERAPEUTIC EXERCISE: To improve strength, endurance, ROM, and flexibility.  Demonstration, verbal and tactile cues throughout  for technique.  Pulleys: Flexion & Scaption x 3 min each  Standing R shoulder flexion/scaption wall slides x 5 - AAROM required with L hand at elbow- limited today Standing R shoulder flexion counter slides with 2# weight on washcloth 2 x 10 Standing R shoulder scaption counter slides with 2# weight on washcloth 2 x 10 Seated hand clasped AAROM shoulder flexion x 5  NEUROMUSCULAR RE-EDUCATION: To improve coordination, kinesthesia, and posture. R shoulder flexion YTB reactive isometric step-outs x 10 (neutral shoulder with elbow flexed to 90) R shoulder extension YTB reactive isometric step-outs x 10 (neutral shoulder with elbow flexed to 90) - cues for scapular retraction activation to stabilize shoulder R shoulder IR YTB reactive isometric step-outs x 10 (neutral shoulder with elbow flexed to 90) R shoulder ER YTB reactive isometric step-outs x 10 (neutral shoulder with elbow flexed to 90) - cues for scapular retraction activation to stabilize shoulder   11/23/2023 THERAPEUTIC EXERCISE: To improve strength, endurance, ROM, and flexibility.  Demonstration, verbal and tactile cues throughout for technique.  Pulleys: Flexion & Scaption x 3 min each  Standing R shoulder flexion/scaption wall slides x 10 - AAROM required with L hand at elbow Standing R shoulder flexion counter slides with 2# weight on washcloth 2 x 10 Standing R shoulder scaption counter slides with 2# weight on washcloth 2 x 10 Seated B shoulder orange Pball flexion & scaption walk-out x 10 each (attempted initially on wall but deferred d/t excessive R  shoulder shrugging  NEUROMUSCULAR RE-EDUCATION: To improve coordination, kinesthesia, and posture. R shoulder flexion YTB reactive isometric step-outs x 5 (neutral shoulder with elbow flexed to 90) R shoulder extension YTB reactive isometric step-outs x 5 (neutral shoulder with elbow flexed to 90) - cues for scapular retraction activation to stabilize shoulder R shoulder IR YTB reactive isometric step-outs x 5 (neutral shoulder with elbow flexed to 90) R shoulder ER YTB reactive isometric step-outs x 5 (neutral shoulder with elbow flexed to 90) - cues for scapular retraction activation to stabilize shoulder   11/21/2023  THERAPEUTIC EXERCISE: To improve strength, endurance, ROM, and flexibility.  Demonstration, verbal and tactile cues throughout for technique. Pulleys: Flexion & Scaption x 3 min each  Supine for gentle R shoulder PROM/stretching all planes Supine flexed elbow R shoulder flexion/scaption (uppercut) AA/AROM 2 x 10 - somewhat better tolerance for AROM but still slight guiding from PT initially with motion  MANUAL THERAPY: To promote normalized muscle tension, improved flexibility, improved joint mobility, and reduced pain utilizing connective tissue massage, therapeutic massage, manual TP therapy, and scar mobilization.  STM/XFM to R shoulder surgical incision Supine gentle R shoulder oscillation and grade I-II inferior glide for pain relief/muscle relxation  NEUROMUSCULAR RE-EDUCATION: To improve coordination, kinesthesia, and posture. S/L R shoulder ER maintaining mini-pillow under axilla and R elbow at 90 degrees 2 x 10 - VC & TC for scapular engagement, avoiding shoulder shrug S/L R shoulder flexion - initially attempted with elbow straight but transitioned to R elbow at 90 degrees x 10  SELF CARE:  Provided education on sleeping position and use of pillow(s) to support R arm.    11/17/2023  THERAPEUTIC EXERCISE: To improve ROM and flexibility.  Demonstration, verbal  and tactile cues throughout for technique.  Pulleys: Flexion & Scaption x 3 min each  Supine for gentle R shoulder PROM/stretching all planes Supine flexed elbow R shoulder flexion/scaption (uppercut) AA/AROM 2 x 10 - AAROM d/t increased discomfort with AROM  MANUAL THERAPY: To promote normalized muscle tension,  improved flexibility, improved joint mobility, and reduced pain utilizing connective tissue massage, therapeutic massage, manual TP therapy, and scar mobilization.  STM/XFM to R shoulder surgical incision STM/DTM and manual TPR to R pecs  NEUROMUSCULAR RE-EDUCATION: To improve coordination, kinesthesia, and posture. S/L R shoulder ER maintaining towel roll under axilla and R elbow at 90 degrees 2 x 10 - VC & TC for scapular engagement S/L R shoulder flexion - initially attempted with elbow straight but transitioned to R elbow at 90 degrees x 10 Seated YTB scap retraction + B shoulder row x 10 Seated YTB scap retraction + B shoulder extension stopping shy of neutral x 10   11/14/23: Pulleys 3 min flexion B Supine for gentle stretching R shoulder all planes Supine with R humerus supported in line with trunk for manually resisted R shoulder isometric inv/ev mass practice Side lying L for R shoulder ER maintaining towel roll under axilla and R elbow at 90 degrees Standing with R elbow supported on counter, pulling towel with 6# on towel from flexed position to neutral, then advance to sitting in chair for yellow t band rows, needed frequent cues to avoid R shoulder extension past trunk, had pt slide back in chair so that the seat back blocked her from extending R shoulder    11/08/2023  THERAPEUTIC EXERCISE: To improve ROM and flexibility.  Demonstration, verbal and tactile cues throughout for technique.  Pulleys: Flexion x 3 min, Scaption x 3 min (scaption better tolerated today) Standing counter wash AAROM LUE: Flexion x 20 Scaption x 20 R shoulder PROM within pain tolerance for  flexion, scaption, IR and ER Measured PROM Shoulder isometrics: flex, abd, ER, ext 5x5 Standing YTB scap retraction + B shoulder row- clicking clicking at first but after readjusting no clicking  Standing YTB scap retraction + B shoulder extension stopping shy of neutral x 10   11/06/2023  THERAPEUTIC EXERCISE: To improve ROM and flexibility.  Demonstration, verbal and tactile cues throughout for technique.  Pulleys: Flexion x 3 min, Scaption x 3 min (scaption better tolerated today) Seated R shoulder AAROM with Swiffer: Flexion 2 x 10 Scaption 2 x 10 R shoulder PROM by PT within pain tolerance for flexion, scaption, IR and ER  Hooklying R shoulder AAROM with cane: Flexion 2 x 10 Scaption x 10 - pt needing PT guidance/assistance for proper movement pattern  NEUROMUSCULAR RE-EDUCATION: To improve coordination, kinesthesia, posture, and proprioception. R shoulder rhythmic stabilization at 90 flexion (1-finger perturbations) 2 x 15-20 sec R shoulder protraction AAROM with wand 2 x 10 Standing YTB scap retraction + B shoulder row x 10 Standing YTB scap retraction + B shoulder extension stopping shy of neutral x 10   11/02/2023  THERAPEUTIC EXERCISE: To improve strength, endurance, ROM, and flexibility.  Demonstration, verbal and tactile cues throughout for technique.  Pulleys: Flexion x 3 min, Scaption x 1.5 min (discontinued d/t increasing discomfort with scaption) Table slides standing at counter top:  Flexion 2 x 10 Scaption 2 x 10 Seated R shoulder AAROM with Swiffer: Flexion 2 x 10 Scaption 2 x 10 R shoulder PROM by PT within pain tolerance for flexion, scaption, IR and ER (ER limited to ~45 per protocol) Standing R shoulder submax (~25% effort) isometrics into wall 5 x 5 - flexion, abduction, extension, IR & ER Standing scapular retraction 10 x 5  SELF CARE:  Yellow Theraputty provided for home grip strengthening   10/30/2023 SELF CARE:  Reviewed eval findings and  role of PT in addressing  identified deficits as well as instruction in initial HEP (see below).    PATIENT EDUCATION:  Education details: HEP review and cautioned patient to adjust reps/set to avoid increased pain and pushing past point of fatigue/loss of control  Person educated: Patient Education method: Explanation, Demonstration, Verbal cues, and Tactile cues Education comprehension: verbalized understanding, returned demonstration, verbal cues required, tactile cues required, and needs further education  HOME EXERCISE PROGRAM: Access Code: [email or web address] URL: [email or web address] Date: 11/30/2023 Prepared by: Greig Speaks  Exercises - Sidelying Shoulder External Rotation  - 1 x daily - 3 x weekly - 3 sets - 10 reps - Sidelying Shoulder Flexion 15 Degrees  - 1 x daily - 3 x weekly - 2 sets - 10 reps - 3 sec hold - Seated Shoulder Extension and Scapular Retraction with Resistance  - 1 x daily - 3 x weekly - 2 sets - 10 reps - 3-5 sec hold - Seated Shoulder Row with Anchored Resistance  - 1 x daily - 3 x weekly - 2 sets - 10 reps - 3-5 hold hold - Shoulder Scar Massage  - 1-2 x daily - 7 x weekly - 1-2 min hold - Supine Shoulder Protraction with Dowel  - 1 x daily - 7 x weekly - 3 sets - 10 reps - Supine Shoulder Press with Dowel  - 1 x daily - 7 x weekly - 3 sets - 10 reps - Supine Shoulder External Rotation with Resistance  - 1 x daily - 7 x weekly - 3 sets - 10 reps   ASSESSMENT:  CLINICAL IMPRESSION: Reviewed HEP continuing to work on functional use of L shoulder. Provided a lot of education on rest intervals and not overdoing her HEP and functional activities at home. Will continue to consolidate HEP per MD request.  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 Pt reports that she has orders to limit R arm use to waist level  only, avoid reaching overhead at this time. She is having difficulty with forward and overhead reaching for items, opening cans w/ R hand, washing hair, and driving. She states that she has learned ways to compensate using her left arm even though she is R hand dominant. Today's assessment was done with all items remaining within patient's tolerance. PROM revealed limitations in flexion, IR, ER, and abd as movement was limited by both pain and muscular guarding. Pt had difficulty fully relaxing the R arm for PROM today and displayed hesitancy to movement beyond waist side. Pt was educated on the importance of taking recovery slow at this point (as advised) and avoid heavy lifting and resistance training after asking to try bicep curls with a small weight at home. Pt is aware that at this stage, it is important to focus of restoring as much ROM as she can and gradually progressing towards more strengthening activities with therapy. Pt scored a 79.5% on the QuickDASH representing severe disability of the UE in day to day activities.  Faith Massey will benefit from skilled PT intervention to address current deficits to improve functional ability, mobility, and activity tolerance w/ dec'd pain interference.   OBJECTIVE IMPAIRMENTS: decreased activity tolerance, decreased endurance, decreased knowledge of condition, decreased mobility, decreased ROM, decreased strength, increased fascial restrictions, impaired perceived functional ability, increased muscle spasms, impaired UE functional use, postural dysfunction, and pain.   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   REHAB POTENTIAL: Good  CLINICAL DECISION MAKING: Evolving/moderate complexity  EVALUATION COMPLEXITY: Moderate  GOALS: Goals reviewed with patient? Yes  SHORT TERM GOALS: Target date: 12/11/2023    Patient will be independent with initial HEP.  Baseline: Goal status: MET - 11/16/23  2.  Patient will report at least 25% improvement in R shoulder pain to improve QoL. Baseline: Worst 5-7/10 11/17/23 - pt noting shoulder more irritable over past week 12/12/23 - R shoulder more irritable (3-4/10) Goal status: MET - 01/02/24 - pain better controlled with minimal to no pain over past 2 weeks  3.  Patient will improve R shoulder flexion PROM to 90 or better to aid in difficulty with forward reaching activities.  Baseline: limited by pain ~60 deg of flexion  Goal status: MET - 11/08/23 - no pain  4.  Patient will decrease her QuickDASH score by at least 15% to decrease severity of disability.  Baseline: 79.5% Goal status:  MET - 11/30/23 - QuickDASH Score: 45.5 / 100 = 45.5 %  LONG TERM GOALS: Target date: 01/22/2024  Patient will be independent with advanced HEP.  Baseline:  12/26/23 - cautioned pt to ease back into shoulder pulleys & scapular exercises with HEP, avoiding increased pain and being aware of fatigue and adjusting reps/sets accordingly Goal status: IN PROGRESS - 01/09/24 - cautioned pt to back off on HEP until current pain exacerbation subsides, avoiding increased pain and being aware of fatigue and adjusting reps/sets accordingly  2.  Patient will decrease her QuickDASH score to by at least 25-30% to improve function and activity tolerance d/t disability. Baseline: 79.5% 11/30/23: 45.5 / 100 = 45.5 % Goal status: MET - 01/09/24 - 47.7 / 100 = 47.7 %  3.  Patient will be able to perform overhead activities such as  washing hair, retrieving items from cabinets to improve independent function at home. Baseline: pt is having difficulty with above activities  11/30/23: progressing not met Goal status: IN PROGRESS - 01/09/24 - was progressing better until flare-up today  4.  Patient will report at least 50% improvement in R shoulder pain to improve QoL.  Baseline: 5-7/10 11/30/23:  0 at rest, up to 3 with use , met so far 12/14/23: pain has been increased over past few visits (3-4/10 today) 01/02/24: pain better controlled with minimal to no pain over past 2 weeks Goal status: IN PROGRESS - 01/09/24 - pt reports 50% improvement overall but currently experiencing another flare-up of unknown cause  5.  Patient will obtain at least 3+/5 MMT of UE for available range to increase activity tolerance.   Baseline: Unable to assess 11/30/23: progressing Goal status: PARTIALLY MET - 01/09/24 - met for IR, ER and extension   PLAN: PT FREQUENCY: 2x/week  PT DURATION: 12 weeks  PLANNED INTERVENTIONS: [id number]- PT Re-evaluation, 97750- Physical Performance Testing, 97110-Therapeutic exercises, 97530- Therapeutic activity, [id number]- Neuromuscular re-education, 97535- Self Care, [id number]- Manual therapy, [id number]- Gait training, [phone]- Electrical stimulation (unattended), [id number]- Ultrasound, 02987- Traction (mechanical), 20560 (1-2 muscles), 20561 (3+ muscles)- Dry Needling, Patient/Family education, Taping, Joint mobilization, Cryotherapy, and Moist  heat  PLAN FOR NEXT SESSION: Resume gentle R shoulder P/AA/AROM within pain free motion and watching closely for substitution related to fatigue; carefully review/revise HEP as indicated with only minimal changes at a time to avoid re-irritation of R shoulder; may use Reverse Total Shoulder Arthroplasty for Fracture Protocol for guidance (Sx 09/07/23) - post-op week #18 as of 01/11/24   Elijah CHRISTELLA Hidden, PT 01/11/2024, 11:54 AM

## 2024-01-15 ENCOUNTER — Ambulatory Visit: Payer: Self-pay

## 2024-01-15 DIAGNOSIS — M6281 Muscle weakness (generalized): Secondary | ICD-10-CM | POA: Diagnosis not present

## 2024-01-15 DIAGNOSIS — R252 Cramp and spasm: Secondary | ICD-10-CM

## 2024-01-15 DIAGNOSIS — M25611 Stiffness of right shoulder, not elsewhere classified: Secondary | ICD-10-CM

## 2024-01-15 DIAGNOSIS — G8929 Other chronic pain: Secondary | ICD-10-CM | POA: Diagnosis not present

## 2024-01-15 DIAGNOSIS — S46211A Strain of muscle, fascia and tendon of other parts of biceps, right arm, initial encounter: Secondary | ICD-10-CM | POA: Diagnosis not present

## 2024-01-15 DIAGNOSIS — M25511 Pain in right shoulder: Secondary | ICD-10-CM | POA: Diagnosis not present

## 2024-01-15 NOTE — Therapy (Signed)
 OUTPATIENT PHYSICAL THERAPY TREATMENT     Patient Name: Faith Massey MRN: [id number] DOB:Feb 03, 1946, 78 y.o., female Today's Date: 01/15/2024  END OF SESSION:  PT End of Session - 01/15/24 1311     Visit Number 21    Date for Recertification  01/22/24    Authorization Type [employer]    Progress Note Due on Visit 29   MD PN on visit #19 (01/08/24)   PT Start Time 1148    PT Stop Time 1231    PT Time Calculation (min) 43 min    Activity Tolerance Patient tolerated treatment well;Patient limited by fatigue    Behavior During Therapy [facility] for tasks assessed/performed               Past Medical History:  Diagnosis Date   Arthritis    Breast CA (HCC)    Chest mass 03/28/2022   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 10/16/2012   Pneumonia    Pre-diabetes    Prediabetes 05/25/2022   Pyelonephritis 11/10/2022   Right knee injury 02/14/2017   S/P radiation therapy 05/09/2023   S/P Robotic Assisted Right Video Thoracoscopy with resection of Thymus 04/25/2022   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   Past Surgical History:  Procedure Laterality Date   ABDOMINAL HYSTERECTOMY  03/22/2003   BACK SURGERY  03/21/1998   BREAST SURGERY     reconstruction   colon susrgery      CYSTOSCOPY WITH STENT PLACEMENT N/A 02/08/2023   Procedure: POSSIBLE CYSTOSCOPY WITH URETERAL STENT PLACEMENT;   Surgeon: Alvaro Ricardo KATHEE Mickey., MD;  Location: [facility];  Service: Urology;  Laterality: N/A;   LEFT HEART CATH AND CORONARY ANGIOGRAPHY N/A 07/20/2023   Procedure: LEFT HEART CATH AND CORONARY ANGIOGRAPHY;  Surgeon: Verlin Lonni BIRCH, MD;  Location: [facility];  Service: Cardiovascular;  Laterality: N/A;   MASTECTOMY Bilateral 03/21/1993   RESECTION OF A THYMOMA     04-25-22   REVISION TOTAL SHOULDER TO REVERSE TOTAL SHOULDER Right 09/07/2023   Procedure: REVISION, REVERSE TOTAL ARTHROPLASTY, SHOULDER;  Surgeon: Melita Drivers, MD;  Location: [facility];  Service: Orthopedics;  Laterality: Right;  HEMI ARTHROPLASTY, BONEGRAFT GLENOID   right total shoulder     Patient Active Problem List   Diagnosis Date Noted   S/P shoulder hemiarthroplasty, 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   Abnormal EKG 07/21/2023   Chest pain 07/20/2023   Preoperative cardiovascular examination 07/19/2023   Arthritis    Breast CA (HCC)    Hyperlipidemia    Laceration of muscle, fascia and tendon of long head of biceps, unspecified arm, initial encounter    Myasthenia gravis (HCC)    Pneumonia    S/P radiation therapy 05/09/2023   Hoarseness of voice 05/09/2023   Diverticular disease 02/08/2023   History of pulmonary embolism 12/22/2022   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   Pyelonephritis 11/10/2022  Prediabetes 05/25/2022   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   S/P Robotic Assisted Right Video Thoracoscopy with resection of Thymus 04/25/2022   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   Myasthenic syndrome (HCC) 04/12/2022   CVA (cerebral vascular accident) (HCC) 03/28/2022   HTN (hypertension) 03/28/2022   Chest mass 03/28/2022   Right knee injury 02/14/2017   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 10/16/2012   Osteoporosis 10/12/2012    PCP: Watt Harlene BROCKS, MD   REFERRING PROVIDER: Melita Drivers, MD   REFERRING DIAG: [id number] (ICD-10-CM) - Presence of right artificial shoulder joint   THERAPY DIAG:  Acute  pain of right shoulder  Stiffness of right shoulder, not elsewhere classified  Muscle weakness (generalized)  Cramp and spasm  RATIONALE FOR EVALUATION AND TREATMENT: Rehabilitation  ONSET DATE: Post-Op R TSA revision/conversion to hemi-arthroplasty on 09/07/2023   NEXT MD VISIT:  ~June 2026   SUBJECTIVE:                                                                                                                                                                                      SUBJECTIVE STATEMENT:  Pt reports she has been having to use TENS unit for pain, R shoulder feels pretty good today   Hand dominance: Right  PERTINENT HISTORY: MG, h/o radiation (March and May 2024) for mediastinal mass, Breast CA, HTN, OP, R TSA (09/07/23)  PAIN:  Are you having pain? No and Yes: NPRS scale: 0/10 Pain location: R anterior upper shoulder, scapular region upon waking in the morning   PRECAUTIONS:  Shoulder: physical therapist referral - Evaluate and treat per protocol status post revision right reverse total shoulder arthroplasty to CTA hemiarthroplasty, DOS 09/07/23 Patient unfortunately had a fracture at the base of the glenosphere making it unstable and requiring removal and conversion to hemiarthroplasty. Please adjust her physical therapy to be done under a very slow and steady and gradual advance of range of motion and gradual use of the right upper extremity within her pain tolerance. Please call me for any additional questions  RED FLAGS: None   WEIGHT BEARING RESTRICTIONS: No  FALLS:  Has patient fallen in last 6 months? Yes. Number of falls a week and a half ago, feel onto buttock while pulling weeds out of yard   LIVING ENVIRONMENT: Lives with: lives alone Lives in: House/apartment Stairs: No Has following equipment at home: Single point cane, shower chair, Grab bars, and walking stick   OCCUPATION: Retired   PLOF: Independent with household mobility with device and  Leisure: watching tv, reading, walking her dog, social gatherings w/ friends to play card games   PATIENT GOALS: Get my  shoulder to functioning, lifting shoulder, bearing weight   OBJECTIVE:  Note: Objective measures were completed at Evaluation unless otherwise noted.  DIAGNOSTIC FINDINGS:  09/20/2022: R shoulder XR  Results: Degenerative changes of the before meals and glenohumeral joints. No other acute findings.   PATIENT SURVEYS :  QuickDASH Score: 79.5 / 100 = 79.5 %  COGNITION: Overall cognitive status: Within functional limits for tasks assessed     SENSATION: WFL  POSTURE: Rounded shoulders, fwd head   UPPER EXTREMITY ROM:  LUE: Grossly WFL   Passive ROM Right eval R 11/02/23 R 11/08/23 R 11/27/23  R 11/30/23   Shoulder flexion Limited, p! Less than 90 (around 60) 84 90 120 120  Shoulder extension       Shoulder abduction Limited less than 90 74 scaption 80 scaption 100 100  Shoulder adduction       Shoulder internal rotation Limited p! 35 at 30 scaption     Shoulder external rotation Limitied p! 45 at 30 scaption 50  64 73  (Blank rows = not tested)  Active ROM R 11/30/23 R 01/09/24  Shoulder flexion 56 62  Shoulder extension  38  Shoulder abduction  60  Shoulder adduction    Shoulder internal rotation  61 - FIR to buttock  Shoulder external rotation  51   (Blank rows = not tested)   UPPER EXTREMITY MMT:  MMT Right eval Left eval R 11/30/23 R 01/09/24 L 01/09/24  Shoulder flexion  4- 2 2 4+  Shoulder extension    4 4+  Shoulder abduction  4+  2 4+  Shoulder adduction       Shoulder internal rotation  4-  4- 4  Shoulder external rotation  4- 3- 3+ 4-  Middle trapezius       Lower trapezius       Elbow flexion  5     Elbow extension  4+     Wrist flexion       Wrist extension       Wrist ulnar deviation       Wrist radial deviation       Wrist pronation       Wrist supination       Grip strength (lbs)       (Blank rows = not tested)  11/30/23:  R  shoulder flexion active to 56 degrees R shoulder ER 3-/5  SHOULDER SPECIAL TESTS: Not Performed   JOINT MOBILITY TESTING:  Not Performed   PALPATION:  TTP: R ant glenoid, collar bone area (tender, not painful)                                                                                                                              TREATMENT DATE:  01/15/24 THERAPEUTIC EXERCISE: To improve strength, endurance, ROM, and flexibility.  Demonstration, verbal and tactile cues throughout for technique.  Seated pulleys: Flexion & Scaption x 3 min each - emphasizing submax effort  at ~25% effort with eccentric lowering, avoiding shoulder shrug and forcing painful ROM Seated R shoulder orange pball slides flexion, scaption   THERAPEUTIC ACTIVITIES: To improve functional performance.  Demonstration, verbal and tactile cues throughout for technique. R UE reach to 6 inch tall yoga block x 10 flexion (about the length of her faucet)   R UE reach to 6 inch tall yoga block x 10 scaption  Standing scap retraction YTB x 10 S/L R shoulder ER no resistnce towel at side x 10 S/L R shoulder flexion no resistance x 10  01/11/24 THERAPEUTIC EXERCISE: To improve strength, endurance, ROM, and flexibility.  Demonstration, verbal and tactile cues throughout for technique.  Seated pulleys: Flexion & Scaption x 3 min each - emphasizing submax effort at ~25% effort with eccentric lowering, avoiding shoulder shrug and forcing painful ROM THERAPEUTIC ACTIVITIES: To improve functional performance.  Demonstration, verbal and tactile cues throughout for technique. R UE reach to 6 inch tall yoga ball x 10 flexion 1lb  R UE reach to 6 inch tall yoga ball x 10 scaption 1lb Standing scap retraction YTB x 10 Standing shoulder ext YTB x 10 S/L R shoulder ER no resistnce towel at side x 10     01/08/24 THERAPEUTIC EXERCISE: To improve ROM and flexibility.  Demonstration, verbal and tactile cues throughout for  technique.  Seated pulleys: Flexion & Scaption x 3 min each - emphasizing submax effort at ~25% effort with eccentric lowering, avoiding shoulder shrug and forcing painful ROM  MANUAL THERAPY: To promote normalized muscle tension, improved flexibility, improved joint mobility, increased ROM, and reduced pain utilizing joint mobilization, connective tissue massage, therapeutic massage, manual TP therapy, and myofascial release.  Supine gentle R shoulder oscillation and grade I-II inferior glide for pain relief/muscle relxation STM/DTM and manual TPR to R UT, LS, infraspinatus, teres group and subscapularis  THERAPEUTIC ACTIVITIES: To improve functional performance.  Demonstration, verbal and tactile cues throughout for technique. R shoulder ROM assessment UE MMT QuickDASH: 47.7 / 100 = 47.7 %  NEUROMUSCULAR RE-EDUCATION: To improve coordination, kinesthesia, and posture. Seated PT assisted AAROM/MWM in R shoulder flexion and scaption to reduce shoulder shrug and promote improved scapular and GH coordination   01/04/24 THERAPEUTIC EXERCISE: To improve strength, endurance, ROM, and flexibility.  Demonstration, verbal and tactile cues throughout for technique.  Seated pulleys: Flexion & Scaption x 3 min each - emphasizing submax effort at ~25% effort with eccentric lowering  S/L R shoulder ER AROM x 10 with 3 oz putty S/L R shoulder flexion AAROM x 10; AROM x 5- fatigued  THERAPEUTIC ACTIVITIES: To improve functional performance.  Demonstration, verbal and tactile cues throughout for technique.  Functional reaches R shoulder flexion with hand to 9 yoga block lifting 3 oz putty tub - performed to onset of fatigue/increased substitution (shoulder hike) Functional reaches R shoulder scaption with hand to 9 yoga block lifting 3 oz putty tub x 5 - fatigued  NEUROMUSCULAR RE-EDUCATION: To improve coordination and proprioception Standing YTB scap retraction/depression + B shoulder row x  10    12/26/2023 THERAPEUTIC EXERCISE: To improve strength, endurance, ROM, and flexibility.  Demonstration, verbal and tactile cues throughout for technique.  Seated pulleys: Flexion & Scaption x 3 min each - emphasizing submax effort at ~25% effort with eccentric lowering  S/L R shoulder ER AROM x 5, lightly resisted AROM with 3 oz putty tub to onset of fatigue (~7 reps) S/L R shoulder flexion AA/AROM x 10   THERAPEUTIC ACTIVITIES: To improve functional performance.  Demonstration, verbal and tactile cues throughout for technique.  Functional reaches R shoulder flexion with hand to 9 yoga block lifting 3 oz putty tub - performed to onset of fatigue/increased substitution (shoulder hike) Functional reaches R shoulder scaption with hand to 9 yoga block lifting 3 oz putty tub - performed to onset of fatigue (feeling of clunking in shoulder)  NEUROMUSCULAR RE-EDUCATION: To improve coordination, kinesthesia, posture, and proprioception. Supine R shoulder rhythmic stabilization IR/ER with shoulder 30 degrees from trunk and elbow at 90 degrees on towel roll Supine R scapular punches with PT supporting arm lightly  Supine R shoulder CW/CCW circles x 10 each with PT supporting arm lightly Standing YTB scap retraction/depression + B shoulder extension stopping shy of neutral x 10 Standing YTB scap retraction/depression + B shoulder row x 10 Standing scapular press-ups leaning lightly on elevated hi/low table with elbows extended        12/28/23:  Seated for shoulder pulley, PROM on R, flex and scaption 2-3 min each Supine for retrograde massage, and gentle AAROM  Supine for various rhythmic stabilization ex: With towel roll under R elbow for shoulder IR/ER elbow 90 degrees, shoulder 30 degrees from trunk Scapular punches with therapist supporting arm lightly in supine PNF sword pulls from L ant hip to R shoulder scaption position, therapist providing manual support and resistance.   Seated  for playground ball squeeze, engaging B shoulder depression and small forward shoulder flexion Seated for manually assisted R UE horizontal adduction/ abduction , gentle, small range Seated rows with yellow theraband, small movements, pt able to maintain B shoulder depression with the rows Seated R triceps extension therapist providing manual resistance   12/26/2023 THERAPEUTIC EXERCISE: To improve strength, endurance, ROM, and flexibility.  Demonstration, verbal and tactile cues throughout for technique.  Pulleys: Flexion & Scaption x 3 min each - emphasizing submax effort at ~25% effort with eccentric lowering  Gentle R UT and LS stretches x 30 each  MANUAL THERAPY: To promote normalized muscle tension, improved flexibility, improved joint mobility, increased ROM, and pain modulation utilizing connective tissue massage, therapeutic massage, and manual TP therapy.  STM/DTM and manual TPR to R UT and LS  THERAPEUTIC ACTIVITIES: To improve functional performance.  Demonstration, verbal and tactile cues throughout for technique.  R shoulder AAROM wall slides with slight support from L hand - cues to avoid shoulder shrug - fatigues quickly Functional reaches R shoulder flexion with hand to 14 box placed on counter - increased shoulder shrug observed, therefore reduced height to 9 stool on counter and performed in flexion and scaption to point of fatigue/increased substitution  NEUROMUSCULAR RE-EDUCATION: To improve coordination, kinesthesia, posture, proprioception, and amplitude of movement.  PT providing tactile cues with fingertips on medial border of scapula. Scap retraction + small ROM B shoulder row (emphasis on scapular movement/activation) 2 x 5 Scap retraction + small ROM B shoulder extension +emphasis on scapular movement/activation) 2 x 5   12/21/23  Therapeutic activity: UBE 3 min F, attempted backward but unable to avoid shrugging R shoulder so discontinued Supine for gentle PROM  all planes R shoulder  Supine with towel roll under humerus, yellow t band ER L with static positioning R shoulder, with B humerus against trunk, mass practice Side lying L for R shoulder ER with towel under axilla, mass practice, really cued verbally and manually for R shoulder depression to avoid impinging ant/superior GH jt Side lying L for R shoulder flexion, therapist assisted, manually depressed R scapula to achieve better  movement pattern Standing for scaption , pt grasping sink and leaning posteriorly to distract R shoulder and flexing trunk to achieve gentle stretch into scaption Standing L forearm on counter for R shoulder extension, again emphasis on avoiding shrugging, engaging proper scapular retraction while moving R shoulder, advised pt to trial at home some this week.   12/14/2023 THERAPEUTIC EXERCISE: To improve ROM and flexibility.  Demonstration, verbal and tactile cues throughout for technique.  Pulleys: Flexion & Scaption x 3 min each - emphasizing submax effort at ~25% effort with eccentric lowering  Supine R should er flexion PROM with PT inhibiting UT/shoulder shrug and cues to minimize muscle activation/guarding - pain increased when pt attempting AAROM Supine R shoulder scaption P/AAROM - better tolerance today Supine R shoulder IR/ER P/AAROM in scapular plane S/L R shoulder flexion PROM progressing to AAROM within pain free ROM  MANUAL THERAPY: To promote normalized muscle tension, improved flexibility, improved joint mobility, increased ROM, pain modulation, and reduced pain utilizing joint mobilization, connective tissue massage, therapeutic massage, manual TP therapy, and myofascial release. Supine gentle R shoulder oscillation and grade I-II inferior glide for pain relief/muscle relxation R shoulder incisional scar massage Supine and S/L STM/DTM and gentle manual TPR to R infraspinatus and teres group  L S/L R scapular mobilization  MODALITIES:  TENS unit to R  shoulder complex - intensity to pt tolerance x 20 min + moist heat pack for pain relief and decreased muscle guarding  SELF CARE: Provided education on home TENS unit set-up and use and continued limited active R UE use until pain better controlled.    12/12/2023  THERAPEUTIC EXERCISE: To improve ROM and flexibility.  Demonstration, verbal and tactile cues throughout for technique.  Pulleys: Flexion & Scaption x 3 min each - reviewed submax effort at ~25% effort with eccentric lowering  Supine R shoulder flexion and scaption P/AAROM with PT inhibiting UT/shoulder shrug   MANUAL THERAPY: To promote normalized muscle tension, improved flexibility, improved joint mobility, increased ROM, and pain modulation utilizing joint mobilization, connective tissue massage, therapeutic massage, and manual TP therapy.  Supine gentle R shoulder oscillation and grade I-II inferior glide for pain relief/muscle relxation Supine with towel roll supporting R post humerus, for gentle ROM R shoulder, with intermittent retrograde massage, STM/DTM and manual TPR to R UT, LS, pecs, subscapularis, teres group and infrapinatus  L S/L R scapular mobilization  MODALITIES:  TENS unit to R shoulder complex - intensity to pt tolerance x 20 min + moist heat pack for pain relief and decreased muscle guarding  SELF CARE: Provided education on pain management options.  Provided education on home TENS unit options including proper set up, precautions and recommended home frequency for use.   12/07/23:  Shoulder pulley flex, scaption, 2 min each.  The patient has been eccentrically lowering her R shoulder with the pulleys, advised her to reduce her engagement of musculature with eccentric lowering to 25% vs a maximal contraction that she has been utilizing. Supine with towel roll supporting R post humerus, for gentle ROM R shoulder, with intermittent retrograde massage.  Assisted R scapular/serratus punches 10x Supine chest press  with yard stick 10x Supine manually resisted isometrics for R shoulder ER 10 reps, 5 sec holds Side lying L for R shoulder isometric adduction, towel roll in axilla,  5 sec holds, 10 reps  Side lying L for R shoulder ER 10 reps , pt reported soreness so stopped Side lying for assisted R shoulder flexion 10 reps, therapist supported  under R hand    11/30/23:  Reassessed for 10th visit progress report Progressed therex according to pts tolerance and to protocol, now 12 weeks post op Shoulder pulley x 3 min flexion and scaption, slow Supine for serratus punches, initially with therapist supporting R arm and then with yard stick Supine for chest presses with yard stick Supine L shoulder ER with yellow theraband, stabilizing, static position R shoulder Side lying L for R shoulder ER Side lying L for R shoulder flexion Updated HEP below   11/27/2023 THERAPEUTIC EXERCISE: To improve strength, endurance, ROM, and flexibility.  Demonstration, verbal and tactile cues throughout for technique.  Pulleys: Flexion & Scaption x 3 min each  Standing R shoulder flexion/scaption wall slides x 5 - AAROM required with L hand at elbow- limited today Standing R shoulder flexion counter slides with 2# weight on washcloth 2 x 10 Standing R shoulder scaption counter slides with 2# weight on washcloth 2 x 10 Seated hand clasped AAROM shoulder flexion x 5  NEUROMUSCULAR RE-EDUCATION: To improve coordination, kinesthesia, and posture. R shoulder flexion YTB reactive isometric step-outs x 10 (neutral shoulder with elbow flexed to 90) R shoulder extension YTB reactive isometric step-outs x 10 (neutral shoulder with elbow flexed to 90) - cues for scapular retraction activation to stabilize shoulder R shoulder IR YTB reactive isometric step-outs x 10 (neutral shoulder with elbow flexed to 90) R shoulder ER YTB reactive isometric step-outs x 10 (neutral shoulder with elbow flexed to 90) - cues for scapular retraction  activation to stabilize shoulder   11/23/2023 THERAPEUTIC EXERCISE: To improve strength, endurance, ROM, and flexibility.  Demonstration, verbal and tactile cues throughout for technique.  Pulleys: Flexion & Scaption x 3 min each  Standing R shoulder flexion/scaption wall slides x 10 - AAROM required with L hand at elbow Standing R shoulder flexion counter slides with 2# weight on washcloth 2 x 10 Standing R shoulder scaption counter slides with 2# weight on washcloth 2 x 10 Seated B shoulder orange Pball flexion & scaption walk-out x 10 each (attempted initially on wall but deferred d/t excessive R shoulder shrugging  NEUROMUSCULAR RE-EDUCATION: To improve coordination, kinesthesia, and posture. R shoulder flexion YTB reactive isometric step-outs x 5 (neutral shoulder with elbow flexed to 90) R shoulder extension YTB reactive isometric step-outs x 5 (neutral shoulder with elbow flexed to 90) - cues for scapular retraction activation to stabilize shoulder R shoulder IR YTB reactive isometric step-outs x 5 (neutral shoulder with elbow flexed to 90) R shoulder ER YTB reactive isometric step-outs x 5 (neutral shoulder with elbow flexed to 90) - cues for scapular retraction activation to stabilize shoulder   11/21/2023  THERAPEUTIC EXERCISE: To improve strength, endurance, ROM, and flexibility.  Demonstration, verbal and tactile cues throughout for technique. Pulleys: Flexion & Scaption x 3 min each  Supine for gentle R shoulder PROM/stretching all planes Supine flexed elbow R shoulder flexion/scaption (uppercut) AA/AROM 2 x 10 - somewhat better tolerance for AROM but still slight guiding from PT initially with motion  MANUAL THERAPY: To promote normalized muscle tension, improved flexibility, improved joint mobility, and reduced pain utilizing connective tissue massage, therapeutic massage, manual TP therapy, and scar mobilization.  STM/XFM to R shoulder surgical incision Supine gentle R  shoulder oscillation and grade I-II inferior glide for pain relief/muscle relxation  NEUROMUSCULAR RE-EDUCATION: To improve coordination, kinesthesia, and posture. S/L R shoulder ER maintaining mini-pillow under axilla and R elbow at 90 degrees 2 x 10 - VC & TC for  scapular engagement, avoiding shoulder shrug S/L R shoulder flexion - initially attempted with elbow straight but transitioned to R elbow at 90 degrees x 10  SELF CARE:  Provided education on sleeping position and use of pillow(s) to support R arm.    11/17/2023  THERAPEUTIC EXERCISE: To improve ROM and flexibility.  Demonstration, verbal and tactile cues throughout for technique.  Pulleys: Flexion & Scaption x 3 min each  Supine for gentle R shoulder PROM/stretching all planes Supine flexed elbow R shoulder flexion/scaption (uppercut) AA/AROM 2 x 10 - AAROM d/t increased discomfort with AROM  MANUAL THERAPY: To promote normalized muscle tension, improved flexibility, improved joint mobility, and reduced pain utilizing connective tissue massage, therapeutic massage, manual TP therapy, and scar mobilization.  STM/XFM to R shoulder surgical incision STM/DTM and manual TPR to R pecs  NEUROMUSCULAR RE-EDUCATION: To improve coordination, kinesthesia, and posture. S/L R shoulder ER maintaining towel roll under axilla and R elbow at 90 degrees 2 x 10 - VC & TC for scapular engagement S/L R shoulder flexion - initially attempted with elbow straight but transitioned to R elbow at 90 degrees x 10 Seated YTB scap retraction + B shoulder row x 10 Seated YTB scap retraction + B shoulder extension stopping shy of neutral x 10   11/14/23: Pulleys 3 min flexion B Supine for gentle stretching R shoulder all planes Supine with R humerus supported in line with trunk for manually resisted R shoulder isometric inv/ev mass practice Side lying L for R shoulder ER maintaining towel roll under axilla and R elbow at 90 degrees Standing with R elbow  supported on counter, pulling towel with 6# on towel from flexed position to neutral, then advance to sitting in chair for yellow t band rows, needed frequent cues to avoid R shoulder extension past trunk, had pt slide back in chair so that the seat back blocked her from extending R shoulder    11/08/2023  THERAPEUTIC EXERCISE: To improve ROM and flexibility.  Demonstration, verbal and tactile cues throughout for technique.  Pulleys: Flexion x 3 min, Scaption x 3 min (scaption better tolerated today) Standing counter wash AAROM LUE: Flexion x 20 Scaption x 20 R shoulder PROM within pain tolerance for flexion, scaption, IR and ER Measured PROM Shoulder isometrics: flex, abd, ER, ext 5x5 Standing YTB scap retraction + B shoulder row- clicking clicking at first but after readjusting no clicking  Standing YTB scap retraction + B shoulder extension stopping shy of neutral x 10   11/06/2023  THERAPEUTIC EXERCISE: To improve ROM and flexibility.  Demonstration, verbal and tactile cues throughout for technique.  Pulleys: Flexion x 3 min, Scaption x 3 min (scaption better tolerated today) Seated R shoulder AAROM with Swiffer: Flexion 2 x 10 Scaption 2 x 10 R shoulder PROM by PT within pain tolerance for flexion, scaption, IR and ER  Hooklying R shoulder AAROM with cane: Flexion 2 x 10 Scaption x 10 - pt needing PT guidance/assistance for proper movement pattern  NEUROMUSCULAR RE-EDUCATION: To improve coordination, kinesthesia, posture, and proprioception. R shoulder rhythmic stabilization at 90 flexion (1-finger perturbations) 2 x 15-20 sec R shoulder protraction AAROM with wand 2 x 10 Standing YTB scap retraction + B shoulder row x 10 Standing YTB scap retraction + B shoulder extension stopping shy of neutral x 10   11/02/2023  THERAPEUTIC EXERCISE: To improve strength, endurance, ROM, and flexibility.  Demonstration, verbal and tactile cues throughout for technique.  Pulleys: Flexion x  3 min, Scaption x 1.5  min (discontinued d/t increasing discomfort with scaption) Table slides standing at counter top:  Flexion 2 x 10 Scaption 2 x 10 Seated R shoulder AAROM with Swiffer: Flexion 2 x 10 Scaption 2 x 10 R shoulder PROM by PT within pain tolerance for flexion, scaption, IR and ER (ER limited to ~45 per protocol) Standing R shoulder submax (~25% effort) isometrics into wall 5 x 5 - flexion, abduction, extension, IR & ER Standing scapular retraction 10 x 5  SELF CARE:  Yellow Theraputty provided for home grip strengthening   10/30/2023 SELF CARE:  Reviewed eval findings and role of PT in addressing identified deficits as well as instruction in initial HEP (see below).    PATIENT EDUCATION:  Education details: HEP review and cautioned patient to adjust reps/set to avoid increased pain and pushing past point of fatigue/loss of control  Person educated: Patient Education method: Explanation, Demonstration, Verbal cues, and Tactile cues Education comprehension: verbalized understanding, returned demonstration, verbal cues required, tactile cues required, and needs further education  HOME EXERCISE PROGRAM: Access Code: [email or web address] URL: [email or web address] Date: 11/30/2023 Prepared by: Greig Speaks  Exercises - Sidelying Shoulder External Rotation  - 1 x daily - 3 x weekly - 3 sets - 10 reps - Sidelying Shoulder Flexion 15 Degrees  - 1 x daily - 3 x weekly - 2 sets - 10 reps - 3 sec hold - Seated Shoulder Extension and Scapular Retraction with Resistance  - 1 x daily - 3 x weekly - 2 sets - 10 reps - 3-5 sec hold - Seated Shoulder Row with Anchored Resistance  - 1 x daily - 3 x weekly - 2 sets - 10 reps - 3-5 hold hold - Shoulder Scar Massage  - 1-2 x daily - 7 x weekly - 1-2 min hold - Supine Shoulder Protraction with Dowel  - 1 x daily - 7 x weekly - 3 sets - 10 reps - Supine Shoulder Press with Dowel  - 1 x daily - 7 x weekly - 3 sets - 10 reps -  Supine Shoulder External Rotation with Resistance  - 1 x daily - 7 x weekly - 3 sets - 10 reps   ASSESSMENT:  CLINICAL IMPRESSION: Reviewed HEP continuing to work on functional use of L shoulder. Progressed to interventions working on reaching to her faucet. 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 Pt reports that she has orders to limit R arm use to waist level only, avoid reaching overhead at this time. She is having difficulty with forward and overhead reaching for items, opening cans w/ R hand, washing hair, and driving. She states that she has learned ways to compensate using her left arm even though she is R hand dominant. Today's assessment was done with all items remaining within patient's tolerance. PROM revealed limitations in flexion, IR, ER, and abd as movement was limited by both pain and muscular guarding. Pt had difficulty fully relaxing the R arm for PROM today and displayed hesitancy to movement beyond waist side. Pt was educated on the importance of taking recovery slow at this point (as advised) and avoid heavy lifting and resistance training after asking to try bicep curls with a small weight at home. Pt is aware that at this stage, it  is important to focus of restoring as much ROM as she can and gradually progressing towards more strengthening activities with therapy. Pt scored a 79.5% on the QuickDASH representing severe disability of the UE in day to day activities.  Faith Massey will benefit from skilled PT intervention to address current deficits to improve functional ability, mobility, and activity tolerance w/ dec'd pain interference.   OBJECTIVE IMPAIRMENTS: decreased activity tolerance, decreased endurance, decreased knowledge of condition, decreased  mobility, decreased ROM, decreased strength, increased fascial restrictions, impaired perceived functional ability, increased muscle spasms, impaired UE functional use, postural dysfunction, and pain.   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   REHAB POTENTIAL: Good  CLINICAL DECISION MAKING: Evolving/moderate complexity  EVALUATION COMPLEXITY: Moderate  GOALS: Goals reviewed with patient? Yes  SHORT TERM GOALS: Target date: 12/11/2023    Patient will be independent with initial HEP.  Baseline: Goal status: MET - 11/16/23  2.  Patient will report at least 25% improvement in R shoulder pain to improve QoL. Baseline: Worst 5-7/10 11/17/23 - pt noting shoulder more irritable over past week 12/12/23 - R shoulder more irritable (3-4/10) Goal status: MET - 01/02/24 - pain better controlled with minimal to no pain over past 2 weeks  3.  Patient will improve R shoulder flexion PROM to 90 or better to aid in difficulty with forward reaching activities.  Baseline: limited by pain ~60 deg of flexion  Goal status: MET - 11/08/23 - no pain  4.  Patient will decrease her QuickDASH score by at least 15% to decrease severity of disability.  Baseline: 79.5% Goal status:  MET - 11/30/23 - QuickDASH Score: 45.5 / 100 = 45.5 %  LONG TERM GOALS: Target date: 01/22/2024  Patient will be independent with advanced HEP.  Baseline:  12/26/23 - cautioned pt to ease back into shoulder pulleys &  scapular exercises with HEP, avoiding increased pain and being aware of fatigue and adjusting reps/sets accordingly Goal status: IN PROGRESS - 01/09/24 - cautioned pt to back off on HEP until current pain exacerbation subsides, avoiding increased pain and being aware of fatigue and adjusting reps/sets accordingly  2.  Patient will decrease her QuickDASH score to by at least 25-30% to improve function and activity tolerance d/t disability. Baseline: 79.5% 11/30/23: 45.5 / 100 = 45.5 % Goal status: MET - 01/09/24 - 47.7 / 100 = 47.7 %  3.  Patient will be able to perform overhead activities such as washing hair, retrieving items from cabinets to improve independent function at home. Baseline: pt is having difficulty with above activities  11/30/23: progressing not met Goal status: IN PROGRESS - 01/09/24 - was progressing better until flare-up today  4.  Patient will report at least 50% improvement in R shoulder pain to improve QoL.  Baseline: 5-7/10 11/30/23:  0 at rest, up to 3 with use , met so far 12/14/23: pain has been increased over past few visits (3-4/10 today) 01/02/24: pain better controlled with minimal to no pain over past 2 weeks Goal status: IN PROGRESS - 01/09/24 - pt reports 50% improvement overall but currently experiencing another flare-up of unknown  cause  5.  Patient will obtain at least 3+/5 MMT of UE for available range to increase activity tolerance.   Baseline: Unable to assess 11/30/23: progressing Goal status: PARTIALLY MET - 01/09/24 - met for IR, ER and extension   PLAN: PT FREQUENCY: 2x/week  PT DURATION: 12 weeks  PLANNED INTERVENTIONS: 97164- PT Re-evaluation, 97750- Physical Performance Testing, 97110-Therapeutic exercises, 97530- Therapeutic activity, [id number]- Neuromuscular re-education, 97535- Self Care, [id number]- Manual therapy, [phone]- Gait training, [phone]- Electrical stimulation (unattended), 97035- Ultrasound, 02987- Traction (mechanical), 20560 (1-2 muscles),  20561 (3+ muscles)- Dry Needling, Patient/Family education, Taping, Joint mobilization, Cryotherapy, and Moist heat  PLAN FOR NEXT SESSION: Resume gentle R shoulder P/AA/AROM within pain free motion and watching closely for substitution related to fatigue; carefully review/revise HEP as indicated with only minimal changes at a time to avoid re-irritation of R shoulder; may use Reverse Total Shoulder Arthroplasty for Fracture Protocol for guidance (Sx 09/07/23) - post-op week #18 as of 01/11/24   Sol LITTIE Gaskins, PTA 01/15/2024, 1:12 PM

## 2024-01-18 ENCOUNTER — Ambulatory Visit: Payer: Self-pay

## 2024-01-18 DIAGNOSIS — R252 Cramp and spasm: Secondary | ICD-10-CM

## 2024-01-18 DIAGNOSIS — M6281 Muscle weakness (generalized): Secondary | ICD-10-CM

## 2024-01-18 DIAGNOSIS — M25511 Pain in right shoulder: Secondary | ICD-10-CM

## 2024-01-18 DIAGNOSIS — M25611 Stiffness of right shoulder, not elsewhere classified: Secondary | ICD-10-CM | POA: Diagnosis not present

## 2024-01-18 DIAGNOSIS — G8929 Other chronic pain: Secondary | ICD-10-CM | POA: Diagnosis not present

## 2024-01-18 DIAGNOSIS — S46211A Strain of muscle, fascia and tendon of other parts of biceps, right arm, initial encounter: Secondary | ICD-10-CM | POA: Diagnosis not present

## 2024-01-18 NOTE — Therapy (Signed)
 OUTPATIENT PHYSICAL THERAPY TREATMENT     Patient Name: Faith Massey MRN: [id number] DOB:1945/06/19, 78 y.o., female Today's Date: 01/18/2024  END OF SESSION:  PT End of Session - 01/18/24 1257     Visit Number 22    Date for Recertification  01/22/24    Authorization Type [employer]    Progress Note Due on Visit 29   MD PN on visit #19 (01/08/24)   PT Start Time 1151    PT Stop Time 1232    PT Time Calculation (min) 41 min    Activity Tolerance Patient tolerated treatment well;Patient limited by fatigue    Behavior During Therapy [facility] for tasks assessed/performed                Past Medical History:  Diagnosis Date   Arthritis    Breast CA (HCC)    Chest mass 03/28/2022   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 10/16/2012   Pneumonia    Pre-diabetes    Prediabetes 05/25/2022   Pyelonephritis 11/10/2022   Right knee injury 02/14/2017   S/P radiation therapy 05/09/2023   S/P Robotic Assisted Right Video Thoracoscopy with resection of Thymus 04/25/2022   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   Past Surgical History:  Procedure Laterality Date   ABDOMINAL HYSTERECTOMY  03/22/2003   BACK SURGERY  03/21/1998   BREAST SURGERY     reconstruction   colon susrgery      CYSTOSCOPY WITH STENT PLACEMENT N/A 02/08/2023   Procedure: POSSIBLE CYSTOSCOPY WITH URETERAL STENT  PLACEMENT;  Surgeon: Alvaro Ricardo KATHEE Mickey., MD;  Location: [facility];  Service: Urology;  Laterality: N/A;   LEFT HEART CATH AND CORONARY ANGIOGRAPHY N/A 07/20/2023   Procedure: LEFT HEART CATH AND CORONARY ANGIOGRAPHY;  Surgeon: Verlin Lonni BIRCH, MD;  Location: [facility];  Service: Cardiovascular;  Laterality: N/A;   MASTECTOMY Bilateral 03/21/1993   RESECTION OF A THYMOMA     04-25-22   REVISION TOTAL SHOULDER TO REVERSE TOTAL SHOULDER Right 09/07/2023   Procedure: REVISION, REVERSE TOTAL ARTHROPLASTY, SHOULDER;  Surgeon: Melita Drivers, MD;  Location: [facility];  Service: Orthopedics;  Laterality: Right;  HEMI ARTHROPLASTY, BONEGRAFT GLENOID   right total shoulder     Patient Active Problem List   Diagnosis Date Noted   S/P shoulder hemiarthroplasty, 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   Abnormal EKG 07/21/2023   Chest pain 07/20/2023   Preoperative cardiovascular examination 07/19/2023   Arthritis    Breast CA (HCC)    Hyperlipidemia    Laceration of muscle, fascia and tendon of long head of biceps, unspecified arm, initial encounter    Myasthenia gravis (HCC)    Pneumonia    S/P radiation therapy 05/09/2023   Hoarseness of voice 05/09/2023   Diverticular disease 02/08/2023   History of pulmonary embolism 12/22/2022   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   Pyelonephritis  11/10/2022   Prediabetes 05/25/2022   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   S/P Robotic Assisted Right Video Thoracoscopy with resection of Thymus 04/25/2022   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   Myasthenic syndrome (HCC) 04/12/2022   CVA (cerebral vascular accident) (HCC) 03/28/2022   HTN (hypertension) 03/28/2022   Chest mass 03/28/2022   Right knee injury 02/14/2017   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 10/16/2012   Osteoporosis 10/12/2012    PCP: Watt Harlene BROCKS, MD   REFERRING PROVIDER: Melita Drivers, MD   REFERRING DIAG: [phone] (ICD-10-CM) - Presence of right artificial shoulder joint   THERAPY DIAG:   Acute pain of right shoulder  Stiffness of right shoulder, not elsewhere classified  Muscle weakness (generalized)  Cramp and spasm  RATIONALE FOR EVALUATION AND TREATMENT: Rehabilitation  ONSET DATE: Post-Op R TSA revision/conversion to hemi-arthroplasty on 09/07/2023   NEXT MD VISIT:  ~June 2026   SUBJECTIVE:                                                                                                                                                                                      SUBJECTIVE STATEMENT:  Pt reports she was able to turn on facet finally. Denies any pain today   Hand dominance: Right  PERTINENT HISTORY: MG, h/o radiation (March and May 2024) for mediastinal mass, Breast CA, HTN, OP, R TSA (09/07/23)  PAIN:  Are you having pain? No and Yes: NPRS scale: 0/10 Pain location: R anterior upper shoulder, scapular region upon waking in the morning   PRECAUTIONS:  Shoulder: physical therapist referral - Evaluate and treat per protocol status post revision right reverse total shoulder arthroplasty to CTA hemiarthroplasty, DOS 09/07/23 Patient unfortunately had a fracture at the base of the glenosphere making it unstable and requiring removal and conversion to hemiarthroplasty. Please adjust her physical therapy to be done under a very slow and steady and gradual advance of range of motion and gradual use of the right upper extremity within her pain tolerance. Please call me for any additional questions  RED FLAGS: None   WEIGHT BEARING RESTRICTIONS: No  FALLS:  Has patient fallen in last 6 months? Yes. Number of falls a week and a half ago, feel onto buttock while pulling weeds out of yard   LIVING ENVIRONMENT: Lives with: lives alone Lives in: House/apartment Stairs: No Has following equipment at home: Single point cane, shower chair, Grab bars, and walking stick   OCCUPATION: Retired   PLOF: Independent with household mobility with device and Leisure:  watching tv, reading, walking her dog, social gatherings w/ friends to play card games   PATIENT GOALS: Get my shoulder  to functioning, lifting shoulder, bearing weight   OBJECTIVE:  Note: Objective measures were completed at Evaluation unless otherwise noted.  DIAGNOSTIC FINDINGS:  09/20/2022: R shoulder XR  Results: Degenerative changes of the before meals and glenohumeral joints. No other acute findings.   PATIENT SURVEYS :  QuickDASH Score: 79.5 / 100 = 79.5 %  COGNITION: Overall cognitive status: Within functional limits for tasks assessed     SENSATION: WFL  POSTURE: Rounded shoulders, fwd head   UPPER EXTREMITY ROM:  LUE: Grossly WFL   Passive ROM Right eval R 11/02/23 R 11/08/23 R 11/27/23  R 11/30/23   Shoulder flexion Limited, p! Less than 90 (around 60) 84 90 120 120  Shoulder extension       Shoulder abduction Limited less than 90 74 scaption 80 scaption 100 100  Shoulder adduction       Shoulder internal rotation Limited p! 35 at 30 scaption     Shoulder external rotation Limitied p! 45 at 30 scaption 50  64 73  (Blank rows = not tested)  Active ROM R 11/30/23 R 01/09/24  Shoulder flexion 56 62  Shoulder extension  38  Shoulder abduction  60  Shoulder adduction    Shoulder internal rotation  61 - FIR to buttock  Shoulder external rotation  51   (Blank rows = not tested)   UPPER EXTREMITY MMT:  MMT Right eval Left eval R 11/30/23 R 01/09/24 L 01/09/24  Shoulder flexion  4- 2 2 4+  Shoulder extension    4 4+  Shoulder abduction  4+  2 4+  Shoulder adduction       Shoulder internal rotation  4-  4- 4  Shoulder external rotation  4- 3- 3+ 4-  Middle trapezius       Lower trapezius       Elbow flexion  5     Elbow extension  4+     Wrist flexion       Wrist extension       Wrist ulnar deviation       Wrist radial deviation       Wrist pronation       Wrist supination       Grip strength (lbs)       (Blank rows = not tested)  11/30/23:  R shoulder  flexion active to 56 degrees R shoulder ER 3-/5  SHOULDER SPECIAL TESTS: Not Performed   JOINT MOBILITY TESTING:  Not Performed   PALPATION:  TTP: R ant glenoid, collar bone area (tender, not painful)                                                                                                                              TREATMENT DATE:  01/18/24 THERAPEUTIC EXERCISE: To improve strength, endurance, ROM, and flexibility.  Demonstration, verbal and tactile cues throughout for technique.  Seated pulleys: Flexion & Scaption x 3 min each - emphasizing submax effort at ~  25% effort with eccentric lowering, avoiding shoulder shrug and forcing painful ROM   THERAPEUTIC ACTIVITIES: To improve functional performance.  Demonstration, verbal and tactile cues throughout for technique. R UE reach to 8 inch tall yoga block on counter x 10 flexion (about the length of her faucet)   R UE reach to 8 inch tall yoga block on counter x 10 scaption (about the length of her faucet) Standing scap retraction YTB x 10 Standing shld ext YTB x 10 S/L R shoulder ER no resistnce towel at side x 15 S/L R shoulder flexion no resistance x 10 Wall slide flexion AAROM not going beyond shoulder height x 5  01/15/24 THERAPEUTIC EXERCISE: To improve strength, endurance, ROM, and flexibility.  Demonstration, verbal and tactile cues throughout for technique.  Seated pulleys: Flexion & Scaption x 3 min each - emphasizing submax effort at ~25% effort with eccentric lowering, avoiding shoulder shrug and forcing painful ROM Seated R shoulder orange pball slides flexion, scaption   THERAPEUTIC ACTIVITIES: To improve functional performance.  Demonstration, verbal and tactile cues throughout for technique. R UE reach to 6 inch tall yoga block x 10 flexion (about the length of her faucet)   R UE reach to 6 inch tall yoga block x 10 scaption  Standing scap retraction YTB x 10 S/L R shoulder ER no resistnce towel at side x  10 S/L R shoulder flexion no resistance x 10  01/11/24 THERAPEUTIC EXERCISE: To improve strength, endurance, ROM, and flexibility.  Demonstration, verbal and tactile cues throughout for technique.  Seated pulleys: Flexion & Scaption x 3 min each - emphasizing submax effort at ~25% effort with eccentric lowering, avoiding shoulder shrug and forcing painful ROM THERAPEUTIC ACTIVITIES: To improve functional performance.  Demonstration, verbal and tactile cues throughout for technique. R UE reach to 6 inch tall yoga ball x 10 flexion 1lb  R UE reach to 6 inch tall yoga ball x 10 scaption 1lb Standing scap retraction YTB x 10 Standing shoulder ext YTB x 10 S/L R shoulder ER no resistnce towel at side x 10     01/08/24 THERAPEUTIC EXERCISE: To improve ROM and flexibility.  Demonstration, verbal and tactile cues throughout for technique.  Seated pulleys: Flexion & Scaption x 3 min each - emphasizing submax effort at ~25% effort with eccentric lowering, avoiding shoulder shrug and forcing painful ROM  MANUAL THERAPY: To promote normalized muscle tension, improved flexibility, improved joint mobility, increased ROM, and reduced pain utilizing joint mobilization, connective tissue massage, therapeutic massage, manual TP therapy, and myofascial release.  Supine gentle R shoulder oscillation and grade I-II inferior glide for pain relief/muscle relxation STM/DTM and manual TPR to R UT, LS, infraspinatus, teres group and subscapularis  THERAPEUTIC ACTIVITIES: To improve functional performance.  Demonstration, verbal and tactile cues throughout for technique. R shoulder ROM assessment UE MMT QuickDASH: 47.7 / 100 = 47.7 %  NEUROMUSCULAR RE-EDUCATION: To improve coordination, kinesthesia, and posture. Seated PT assisted AAROM/MWM in R shoulder flexion and scaption to reduce shoulder shrug and promote improved scapular and GH coordination   01/04/24 THERAPEUTIC EXERCISE: To improve strength,  endurance, ROM, and flexibility.  Demonstration, verbal and tactile cues throughout for technique.  Seated pulleys: Flexion & Scaption x 3 min each - emphasizing submax effort at ~25% effort with eccentric lowering  S/L R shoulder ER AROM x 10 with 3 oz putty S/L R shoulder flexion AAROM x 10; AROM x 5- fatigued  THERAPEUTIC ACTIVITIES: To improve functional performance.  Demonstration, verbal and tactile  cues throughout for technique.  Functional reaches R shoulder flexion with hand to 9 yoga block lifting 3 oz putty tub - performed to onset of fatigue/increased substitution (shoulder hike) Functional reaches R shoulder scaption with hand to 9 yoga block lifting 3 oz putty tub x 5 - fatigued  NEUROMUSCULAR RE-EDUCATION: To improve coordination and proprioception Standing YTB scap retraction/depression + B shoulder row x 10    12/26/2023 THERAPEUTIC EXERCISE: To improve strength, endurance, ROM, and flexibility.  Demonstration, verbal and tactile cues throughout for technique.  Seated pulleys: Flexion & Scaption x 3 min each - emphasizing submax effort at ~25% effort with eccentric lowering  S/L R shoulder ER AROM x 5, lightly resisted AROM with 3 oz putty tub to onset of fatigue (~7 reps) S/L R shoulder flexion AA/AROM x 10   THERAPEUTIC ACTIVITIES: To improve functional performance.  Demonstration, verbal and tactile cues throughout for technique.  Functional reaches R shoulder flexion with hand to 9 yoga block lifting 3 oz putty tub - performed to onset of fatigue/increased substitution (shoulder hike) Functional reaches R shoulder scaption with hand to 9 yoga block lifting 3 oz putty tub - performed to onset of fatigue (feeling of clunking in shoulder)  NEUROMUSCULAR RE-EDUCATION: To improve coordination, kinesthesia, posture, and proprioception. Supine R shoulder rhythmic stabilization IR/ER with shoulder 30 degrees from trunk and elbow at 90 degrees on towel roll Supine R  scapular punches with PT supporting arm lightly  Supine R shoulder CW/CCW circles x 10 each with PT supporting arm lightly Standing YTB scap retraction/depression + B shoulder extension stopping shy of neutral x 10 Standing YTB scap retraction/depression + B shoulder row x 10 Standing scapular press-ups leaning lightly on elevated hi/low table with elbows extended        12/28/23:  Seated for shoulder pulley, PROM on R, flex and scaption 2-3 min each Supine for retrograde massage, and gentle AAROM  Supine for various rhythmic stabilization ex: With towel roll under R elbow for shoulder IR/ER elbow 90 degrees, shoulder 30 degrees from trunk Scapular punches with therapist supporting arm lightly in supine PNF sword pulls from L ant hip to R shoulder scaption position, therapist providing manual support and resistance.   Seated for playground ball squeeze, engaging B shoulder depression and small forward shoulder flexion Seated for manually assisted R UE horizontal adduction/ abduction , gentle, small range Seated rows with yellow theraband, small movements, pt able to maintain B shoulder depression with the rows Seated R triceps extension therapist providing manual resistance   12/26/2023 THERAPEUTIC EXERCISE: To improve strength, endurance, ROM, and flexibility.  Demonstration, verbal and tactile cues throughout for technique.  Pulleys: Flexion & Scaption x 3 min each - emphasizing submax effort at ~25% effort with eccentric lowering  Gentle R UT and LS stretches x 30 each  MANUAL THERAPY: To promote normalized muscle tension, improved flexibility, improved joint mobility, increased ROM, and pain modulation utilizing connective tissue massage, therapeutic massage, and manual TP therapy.  STM/DTM and manual TPR to R UT and LS  THERAPEUTIC ACTIVITIES: To improve functional performance.  Demonstration, verbal and tactile cues throughout for technique.  R shoulder AAROM wall slides with slight  support from L hand - cues to avoid shoulder shrug - fatigues quickly Functional reaches R shoulder flexion with hand to 14 box placed on counter - increased shoulder shrug observed, therefore reduced height to 9 stool on counter and performed in flexion and scaption to point of fatigue/increased substitution  NEUROMUSCULAR RE-EDUCATION: To  improve coordination, kinesthesia, posture, proprioception, and amplitude of movement.  PT providing tactile cues with fingertips on medial border of scapula. Scap retraction + small ROM B shoulder row (emphasis on scapular movement/activation) 2 x 5 Scap retraction + small ROM B shoulder extension +emphasis on scapular movement/activation) 2 x 5   12/21/23  Therapeutic activity: UBE 3 min F, attempted backward but unable to avoid shrugging R shoulder so discontinued Supine for gentle PROM all planes R shoulder  Supine with towel roll under humerus, yellow t band ER L with static positioning R shoulder, with B humerus against trunk, mass practice Side lying L for R shoulder ER with towel under axilla, mass practice, really cued verbally and manually for R shoulder depression to avoid impinging ant/superior GH jt Side lying L for R shoulder flexion, therapist assisted, manually depressed R scapula to achieve better movement pattern Standing for scaption , pt grasping sink and leaning posteriorly to distract R shoulder and flexing trunk to achieve gentle stretch into scaption Standing L forearm on counter for R shoulder extension, again emphasis on avoiding shrugging, engaging proper scapular retraction while moving R shoulder, advised pt to trial at home some this week.   12/14/2023 THERAPEUTIC EXERCISE: To improve ROM and flexibility.  Demonstration, verbal and tactile cues throughout for technique.  Pulleys: Flexion & Scaption x 3 min each - emphasizing submax effort at ~25% effort with eccentric lowering  Supine R should er flexion PROM with PT inhibiting  UT/shoulder shrug and cues to minimize muscle activation/guarding - pain increased when pt attempting AAROM Supine R shoulder scaption P/AAROM - better tolerance today Supine R shoulder IR/ER P/AAROM in scapular plane S/L R shoulder flexion PROM progressing to AAROM within pain free ROM  MANUAL THERAPY: To promote normalized muscle tension, improved flexibility, improved joint mobility, increased ROM, pain modulation, and reduced pain utilizing joint mobilization, connective tissue massage, therapeutic massage, manual TP therapy, and myofascial release. Supine gentle R shoulder oscillation and grade I-II inferior glide for pain relief/muscle relxation R shoulder incisional scar massage Supine and S/L STM/DTM and gentle manual TPR to R infraspinatus and teres group  L S/L R scapular mobilization  MODALITIES:  TENS unit to R shoulder complex - intensity to pt tolerance x 20 min + moist heat pack for pain relief and decreased muscle guarding  SELF CARE: Provided education on home TENS unit set-up and use and continued limited active R UE use until pain better controlled.    12/12/2023  THERAPEUTIC EXERCISE: To improve ROM and flexibility.  Demonstration, verbal and tactile cues throughout for technique.  Pulleys: Flexion & Scaption x 3 min each - reviewed submax effort at ~25% effort with eccentric lowering  Supine R shoulder flexion and scaption P/AAROM with PT inhibiting UT/shoulder shrug   MANUAL THERAPY: To promote normalized muscle tension, improved flexibility, improved joint mobility, increased ROM, and pain modulation utilizing joint mobilization, connective tissue massage, therapeutic massage, and manual TP therapy.  Supine gentle R shoulder oscillation and grade I-II inferior glide for pain relief/muscle relxation Supine with towel roll supporting R post humerus, for gentle ROM R shoulder, with intermittent retrograde massage, STM/DTM and manual TPR to R UT, LS, pecs, subscapularis,  teres group and infrapinatus  L S/L R scapular mobilization  MODALITIES:  TENS unit to R shoulder complex - intensity to pt tolerance x 20 min + moist heat pack for pain relief and decreased muscle guarding  SELF CARE: Provided education on pain management options.  Provided education on home  TENS unit options including proper set up, precautions and recommended home frequency for use.   12/07/23:  Shoulder pulley flex, scaption, 2 min each.  The patient has been eccentrically lowering her R shoulder with the pulleys, advised her to reduce her engagement of musculature with eccentric lowering to 25% vs a maximal contraction that she has been utilizing. Supine with towel roll supporting R post humerus, for gentle ROM R shoulder, with intermittent retrograde massage.  Assisted R scapular/serratus punches 10x Supine chest press with yard stick 10x Supine manually resisted isometrics for R shoulder ER 10 reps, 5 sec holds Side lying L for R shoulder isometric adduction, towel roll in axilla,  5 sec holds, 10 reps  Side lying L for R shoulder ER 10 reps , pt reported soreness so stopped Side lying for assisted R shoulder flexion 10 reps, therapist supported under R hand    11/30/23:  Reassessed for 10th visit progress report Progressed therex according to pts tolerance and to protocol, now 12 weeks post op Shoulder pulley x 3 min flexion and scaption, slow Supine for serratus punches, initially with therapist supporting R arm and then with yard stick Supine for chest presses with yard stick Supine L shoulder ER with yellow theraband, stabilizing, static position R shoulder Side lying L for R shoulder ER Side lying L for R shoulder flexion Updated HEP below   11/27/2023 THERAPEUTIC EXERCISE: To improve strength, endurance, ROM, and flexibility.  Demonstration, verbal and tactile cues throughout for technique.  Pulleys: Flexion & Scaption x 3 min each  Standing R shoulder flexion/scaption  wall slides x 5 - AAROM required with L hand at elbow- limited today Standing R shoulder flexion counter slides with 2# weight on washcloth 2 x 10 Standing R shoulder scaption counter slides with 2# weight on washcloth 2 x 10 Seated hand clasped AAROM shoulder flexion x 5  NEUROMUSCULAR RE-EDUCATION: To improve coordination, kinesthesia, and posture. R shoulder flexion YTB reactive isometric step-outs x 10 (neutral shoulder with elbow flexed to 90) R shoulder extension YTB reactive isometric step-outs x 10 (neutral shoulder with elbow flexed to 90) - cues for scapular retraction activation to stabilize shoulder R shoulder IR YTB reactive isometric step-outs x 10 (neutral shoulder with elbow flexed to 90) R shoulder ER YTB reactive isometric step-outs x 10 (neutral shoulder with elbow flexed to 90) - cues for scapular retraction activation to stabilize shoulder   11/23/2023 THERAPEUTIC EXERCISE: To improve strength, endurance, ROM, and flexibility.  Demonstration, verbal and tactile cues throughout for technique.  Pulleys: Flexion & Scaption x 3 min each  Standing R shoulder flexion/scaption wall slides x 10 - AAROM required with L hand at elbow Standing R shoulder flexion counter slides with 2# weight on washcloth 2 x 10 Standing R shoulder scaption counter slides with 2# weight on washcloth 2 x 10 Seated B shoulder orange Pball flexion & scaption walk-out x 10 each (attempted initially on wall but deferred d/t excessive R shoulder shrugging  NEUROMUSCULAR RE-EDUCATION: To improve coordination, kinesthesia, and posture. R shoulder flexion YTB reactive isometric step-outs x 5 (neutral shoulder with elbow flexed to 90) R shoulder extension YTB reactive isometric step-outs x 5 (neutral shoulder with elbow flexed to 90) - cues for scapular retraction activation to stabilize shoulder R shoulder IR YTB reactive isometric step-outs x 5 (neutral shoulder with elbow flexed to 90) R shoulder ER  YTB reactive isometric step-outs x 5 (neutral shoulder with elbow flexed to 90) - cues for scapular retraction activation  to stabilize shoulder   11/21/2023  THERAPEUTIC EXERCISE: To improve strength, endurance, ROM, and flexibility.  Demonstration, verbal and tactile cues throughout for technique. Pulleys: Flexion & Scaption x 3 min each  Supine for gentle R shoulder PROM/stretching all planes Supine flexed elbow R shoulder flexion/scaption (uppercut) AA/AROM 2 x 10 - somewhat better tolerance for AROM but still slight guiding from PT initially with motion  MANUAL THERAPY: To promote normalized muscle tension, improved flexibility, improved joint mobility, and reduced pain utilizing connective tissue massage, therapeutic massage, manual TP therapy, and scar mobilization.  STM/XFM to R shoulder surgical incision Supine gentle R shoulder oscillation and grade I-II inferior glide for pain relief/muscle relxation  NEUROMUSCULAR RE-EDUCATION: To improve coordination, kinesthesia, and posture. S/L R shoulder ER maintaining mini-pillow under axilla and R elbow at 90 degrees 2 x 10 - VC & TC for scapular engagement, avoiding shoulder shrug S/L R shoulder flexion - initially attempted with elbow straight but transitioned to R elbow at 90 degrees x 10  SELF CARE:  Provided education on sleeping position and use of pillow(s) to support R arm.    11/17/2023  THERAPEUTIC EXERCISE: To improve ROM and flexibility.  Demonstration, verbal and tactile cues throughout for technique.  Pulleys: Flexion & Scaption x 3 min each  Supine for gentle R shoulder PROM/stretching all planes Supine flexed elbow R shoulder flexion/scaption (uppercut) AA/AROM 2 x 10 - AAROM d/t increased discomfort with AROM  MANUAL THERAPY: To promote normalized muscle tension, improved flexibility, improved joint mobility, and reduced pain utilizing connective tissue massage, therapeutic massage, manual TP therapy, and scar  mobilization.  STM/XFM to R shoulder surgical incision STM/DTM and manual TPR to R pecs  NEUROMUSCULAR RE-EDUCATION: To improve coordination, kinesthesia, and posture. S/L R shoulder ER maintaining towel roll under axilla and R elbow at 90 degrees 2 x 10 - VC & TC for scapular engagement S/L R shoulder flexion - initially attempted with elbow straight but transitioned to R elbow at 90 degrees x 10 Seated YTB scap retraction + B shoulder row x 10 Seated YTB scap retraction + B shoulder extension stopping shy of neutral x 10   11/14/23: Pulleys 3 min flexion B Supine for gentle stretching R shoulder all planes Supine with R humerus supported in line with trunk for manually resisted R shoulder isometric inv/ev mass practice Side lying L for R shoulder ER maintaining towel roll under axilla and R elbow at 90 degrees Standing with R elbow supported on counter, pulling towel with 6# on towel from flexed position to neutral, then advance to sitting in chair for yellow t band rows, needed frequent cues to avoid R shoulder extension past trunk, had pt slide back in chair so that the seat back blocked her from extending R shoulder    11/08/2023  THERAPEUTIC EXERCISE: To improve ROM and flexibility.  Demonstration, verbal and tactile cues throughout for technique.  Pulleys: Flexion x 3 min, Scaption x 3 min (scaption better tolerated today) Standing counter wash AAROM LUE: Flexion x 20 Scaption x 20 R shoulder PROM within pain tolerance for flexion, scaption, IR and ER Measured PROM Shoulder isometrics: flex, abd, ER, ext 5x5 Standing YTB scap retraction + B shoulder row- clicking clicking at first but after readjusting no clicking  Standing YTB scap retraction + B shoulder extension stopping shy of neutral x 10   11/06/2023  THERAPEUTIC EXERCISE: To improve ROM and flexibility.  Demonstration, verbal and tactile cues throughout for technique.  Pulleys: Flexion x 3  min, Scaption x 3 min  (scaption better tolerated today) Seated R shoulder AAROM with Swiffer: Flexion 2 x 10 Scaption 2 x 10 R shoulder PROM by PT within pain tolerance for flexion, scaption, IR and ER  Hooklying R shoulder AAROM with cane: Flexion 2 x 10 Scaption x 10 - pt needing PT guidance/assistance for proper movement pattern  NEUROMUSCULAR RE-EDUCATION: To improve coordination, kinesthesia, posture, and proprioception. R shoulder rhythmic stabilization at 90 flexion (1-finger perturbations) 2 x 15-20 sec R shoulder protraction AAROM with wand 2 x 10 Standing YTB scap retraction + B shoulder row x 10 Standing YTB scap retraction + B shoulder extension stopping shy of neutral x 10   11/02/2023  THERAPEUTIC EXERCISE: To improve strength, endurance, ROM, and flexibility.  Demonstration, verbal and tactile cues throughout for technique.  Pulleys: Flexion x 3 min, Scaption x 1.5 min (discontinued d/t increasing discomfort with scaption) Table slides standing at counter top:  Flexion 2 x 10 Scaption 2 x 10 Seated R shoulder AAROM with Swiffer: Flexion 2 x 10 Scaption 2 x 10 R shoulder PROM by PT within pain tolerance for flexion, scaption, IR and ER (ER limited to ~45 per protocol) Standing R shoulder submax (~25% effort) isometrics into wall 5 x 5 - flexion, abduction, extension, IR & ER Standing scapular retraction 10 x 5  SELF CARE:  Yellow Theraputty provided for home grip strengthening   10/30/2023 SELF CARE:  Reviewed eval findings and role of PT in addressing identified deficits as well as instruction in initial HEP (see below).    PATIENT EDUCATION:  Education details: HEP review and cautioned patient to adjust reps/set to avoid increased pain and pushing past point of fatigue/loss of control  Person educated: Patient Education method: Explanation, Demonstration, Verbal cues, and Tactile cues Education comprehension: verbalized understanding, returned demonstration, verbal cues  required, tactile cues required, and needs further education  HOME EXERCISE PROGRAM: Access Code: [email or web address] URL: [email or web address] Date: 01/18/2024 Prepared by: Sol Gaskins  Exercises - Sidelying Shoulder External Rotation  - 1 x daily - 3 x weekly - 3 sets - 10 reps - Standing Bilateral Low Shoulder Row with Anchored Resistance  - 1 x daily - 7 x weekly - 2 sets - 10 reps - Shoulder Extension with Anchored Resistance  - 1 x daily - 7 x weekly - 2 sets - 10 reps - Shoulder Flexion Wall Slide with Towel  - 1 x daily - 7 x weekly - 2 sets - 5 reps ** helping with LUE and not going beyond shoulder height - Shoulder Scar Massage  - 1-2 x daily - 7 x weekly - 1-2 min hold ** includes forward reaching exercises into flexion, scaption about the length of her faucet   ASSESSMENT:  CLINICAL IMPRESSION: Reviewed HEP continuing to work on functional use of L shoulder. She did note being able to reach to her faucet yesterday with L arm with no pain. Noticed a catch with the S/L shoulder flexion exercises which is unusual, so deferred this exercise. Added gentle wall slide and advised her to avoid going beyond shoulder height and with help of L UE. 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 Pt reports that she has orders to limit R arm use to waist  level only, avoid reaching overhead at this time. She is having difficulty with forward and overhead reaching for items, opening cans w/ R hand, washing hair, and driving. She states that she has learned ways to compensate using her left arm even though she is R hand dominant. Today's assessment was done with all items remaining within patient's tolerance. PROM revealed limitations in flexion, IR, ER, and abd as  movement was limited by both pain and muscular guarding. Pt had difficulty fully relaxing the R arm for PROM today and displayed hesitancy to movement beyond waist side. Pt was educated on the importance of taking recovery slow at this point (as advised) and avoid heavy lifting and resistance training after asking to try bicep curls with a small weight at home. Pt is aware that at this stage, it is important to focus of restoring as much ROM as she can and gradually progressing towards more strengthening activities with therapy. Pt scored a 79.5% on the QuickDASH representing severe disability of the UE in day to day activities.  Kyanna will benefit from skilled PT intervention to address current deficits to improve functional ability, mobility, and activity tolerance w/ dec'd pain interference.   OBJECTIVE IMPAIRMENTS: decreased activity tolerance, decreased endurance, decreased knowledge of condition, decreased mobility, decreased ROM, decreased strength, increased fascial restrictions, impaired perceived functional ability, increased muscle spasms, impaired UE functional use, postural dysfunction, and pain.   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   REHAB POTENTIAL: Good  CLINICAL DECISION MAKING: Evolving/moderate complexity  EVALUATION COMPLEXITY: Moderate  GOALS: Goals reviewed with patient? Yes  SHORT TERM GOALS: Target date: 12/11/2023    Patient will be independent with  initial HEP.  Baseline: Goal status: MET - 11/16/23  2.  Patient will report at least 25% improvement in R shoulder pain to improve QoL. Baseline: Worst 5-7/10 11/17/23 - pt noting shoulder more irritable over past week 12/12/23 - R shoulder more irritable (3-4/10) Goal status: MET - 01/02/24 - pain better controlled with minimal to no pain over past 2 weeks  3.  Patient will improve R shoulder flexion PROM to 90 or better to aid in difficulty with forward reaching activities.  Baseline: limited by pain ~60 deg of flexion  Goal status: MET - 11/08/23 - no pain  4.  Patient will decrease her QuickDASH score by at least 15% to decrease severity of disability.  Baseline: 79.5% Goal status:  MET - 11/30/23 - QuickDASH Score: 45.5 / 100 = 45.5 %  LONG TERM GOALS: Target date: 01/22/2024  Patient will be independent with advanced HEP.  Baseline:  12/26/23 - cautioned pt to ease back into shoulder pulleys & scapular exercises with HEP, avoiding increased pain and being aware of fatigue and adjusting reps/sets accordingly Goal status: IN PROGRESS - 01/18/24-refined HEP, patient doing better, not flaring up as before  2.  Patient will decrease her QuickDASH score to by at least 25-30% to improve function and activity tolerance d/t disability. Baseline: 79.5% 11/30/23: 45.5 / 100 = 45.5 % Goal status: MET - 01/09/24 - 47.7 / 100 = 47.7 %  3.  Patient will  be able to perform overhead activities such as washing hair, retrieving items from cabinets to improve independent function at home. Baseline: pt is having difficulty with above activities  11/30/23: progressing not met Goal status: IN PROGRESS - 01/18/24- progressed but still difficult to reach into cabinet  4.  Patient will report at least 50% improvement in R shoulder pain to improve QoL.  Baseline: 5-7/10 11/30/23:  0 at rest, up to 3 with use , met so far 12/14/23: pain has been increased over past few visits (3-4/10 today) 01/02/24: pain  better controlled with minimal to no pain over past 2 weeks Goal status: IN PROGRESS - 01/09/24 - pt reports 50% improvement overall but currently experiencing another flare-up of unknown cause  5.  Patient will obtain at least 3+/5 MMT of UE for available range to increase activity tolerance.   Baseline: Unable to assess 11/30/23: progressing Goal status: PARTIALLY MET - 01/09/24 - met for IR, ER and extension   PLAN: PT FREQUENCY: 2x/week  PT DURATION: 12 weeks  PLANNED INTERVENTIONS: [id number]- PT Re-evaluation, 97750- Physical Performance Testing, 97110-Therapeutic exercises, 97530- Therapeutic activity, [id number]- Neuromuscular re-education, 97535- Self Care, [id number]- Manual therapy, [phone]- Gait training, [phone]- Electrical stimulation (unattended), 97035- Ultrasound, 02987- Traction (mechanical), 20560 (1-2 muscles), 20561 (3+ muscles)- Dry Needling, Patient/Family education, Taping, Joint mobilization, Cryotherapy, and Moist heat  PLAN FOR NEXT SESSION: carefully review/revise HEP as indicated with only minimal changes at a time to avoid re-irritation of R shoulder; may use Reverse Total Shoulder Arthroplasty for Fracture Protocol for guidance (Sx 09/07/23) - post-op week #19 as of 01/18/24   Sol LITTIE Gaskins, PTA 01/18/2024, 1:00 PM

## 2024-01-22 ENCOUNTER — Encounter: Payer: Self-pay | Admitting: Physical Therapy

## 2024-01-22 ENCOUNTER — Encounter: Payer: Self-pay | Admitting: Radiology

## 2024-01-22 ENCOUNTER — Ambulatory Visit: Payer: Self-pay | Attending: Orthopedic Surgery | Admitting: Physical Therapy

## 2024-01-22 DIAGNOSIS — M25511 Pain in right shoulder: Secondary | ICD-10-CM | POA: Diagnosis not present

## 2024-01-22 DIAGNOSIS — M6281 Muscle weakness (generalized): Secondary | ICD-10-CM | POA: Diagnosis not present

## 2024-01-22 DIAGNOSIS — M25611 Stiffness of right shoulder, not elsewhere classified: Secondary | ICD-10-CM | POA: Diagnosis not present

## 2024-01-22 DIAGNOSIS — R252 Cramp and spasm: Secondary | ICD-10-CM | POA: Insufficient documentation

## 2024-01-22 NOTE — Therapy (Signed)
 OUTPATIENT PHYSICAL THERAPY TREATMENT   Progress Note  Reporting Period 01/09/2024 to 01/22/2024   See note below for Objective Data and Assessment of Progress/Goals.     Patient Name: Faith Massey MRN: [id number] DOB:06/17/1945, 78 y.o., female Today's Date: 01/22/2024  END OF SESSION:  PT End of Session - 01/22/24 1446     Visit Number 23    Date for Recertification  01/22/24    Authorization Type [employer]    Progress Note Due on Visit 29   MD PN on visit #19 (01/08/24)   PT Start Time 1446    PT Stop Time 1529    PT Time Calculation (min) 43 min    Activity Tolerance Patient tolerated treatment well;Patient limited by fatigue    Behavior During Therapy [facility] for tasks assessed/performed                 Past Medical History:  Diagnosis Date   Arthritis    Breast CA (HCC)    Chest mass 03/28/2022   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 10/16/2012   Pneumonia    Pre-diabetes    Prediabetes 05/25/2022   Pyelonephritis 11/10/2022   Right knee injury 02/14/2017   S/P radiation therapy 05/09/2023   S/P Robotic Assisted Right Video Thoracoscopy with resection of Thymus 04/25/2022   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   Past Surgical History:  Procedure Laterality Date   ABDOMINAL HYSTERECTOMY  03/22/2003   BACK SURGERY  03/21/1998   BREAST SURGERY     reconstruction    colon susrgery      CYSTOSCOPY WITH STENT PLACEMENT N/A 02/08/2023   Procedure: POSSIBLE CYSTOSCOPY WITH URETERAL STENT PLACEMENT;  Surgeon: Alvaro Ricardo KATHEE Mickey., MD;  Location: [facility];  Service: Urology;  Laterality: N/A;   LEFT HEART CATH AND CORONARY ANGIOGRAPHY N/A 07/20/2023   Procedure: LEFT HEART CATH AND CORONARY ANGIOGRAPHY;  Surgeon: Verlin Lonni BIRCH, MD;  Location: [facility];  Service: Cardiovascular;  Laterality: N/A;   MASTECTOMY Bilateral 03/21/1993   RESECTION OF A THYMOMA     04-25-22   REVISION TOTAL SHOULDER TO REVERSE TOTAL SHOULDER Right 09/07/2023   Procedure: REVISION, REVERSE TOTAL ARTHROPLASTY, SHOULDER;  Surgeon: Melita Drivers, MD;  Location: [facility];  Service: Orthopedics;  Laterality: Right;  HEMI ARTHROPLASTY, BONEGRAFT GLENOID   right total shoulder     Patient Active Problem List   Diagnosis Date Noted   S/P shoulder hemiarthroplasty, 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   Abnormal EKG 07/21/2023   Chest pain 07/20/2023   Preoperative cardiovascular examination 07/19/2023   Arthritis    Breast CA (HCC)    Hyperlipidemia    Laceration of muscle, fascia and tendon of long head of biceps, unspecified arm, initial encounter    Myasthenia gravis (HCC)    Pneumonia    S/P radiation therapy 05/09/2023   Hoarseness  of voice 05/09/2023   Diverticular disease 02/08/2023   History of pulmonary embolism 12/22/2022   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   Pyelonephritis 11/10/2022   Prediabetes 05/25/2022   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   S/P Robotic Assisted Right Video Thoracoscopy with resection of Thymus 04/25/2022   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   Myasthenic syndrome (HCC) 04/12/2022   CVA (cerebral vascular accident) (HCC) 03/28/2022   HTN (hypertension) 03/28/2022   Chest mass 03/28/2022   Right knee injury 02/14/2017   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 10/16/2012   Osteoporosis 10/12/2012    PCP: Watt Harlene BROCKS, MD   REFERRING  PROVIDER: Melita Drivers, MD   REFERRING DIAG: [phone] (ICD-10-CM) - Presence of right artificial shoulder joint   THERAPY DIAG:  Acute pain of right shoulder  Stiffness of right shoulder, not elsewhere classified  Muscle weakness (generalized)  Cramp and spasm  RATIONALE FOR EVALUATION AND TREATMENT: Rehabilitation  ONSET DATE: Post-Op R TSA revision/conversion to hemi-arthroplasty on 09/07/2023   NEXT MD VISIT:  ~June 2026   SUBJECTIVE:                                                                                                                                                                                      SUBJECTIVE STATEMENT:  Pt reports she has been more consistently able to turn on facet. Denies any pain today.   Hand dominance: Right  PERTINENT HISTORY: MG, h/o radiation (March and May 2024) for mediastinal mass, Breast CA, HTN, OP, R TSA (09/07/23)  PAIN:  Are you having pain? No and Yes: NPRS scale: 0/10 Pain location: R anterior upper shoulder, scapular region upon waking in the morning   PRECAUTIONS:  Shoulder: physical therapist referral - Evaluate and treat per protocol status post revision right reverse total shoulder arthroplasty to CTA hemiarthroplasty, DOS 09/07/23 Patient unfortunately had a fracture at the base of the glenosphere making it unstable and requiring removal and conversion to hemiarthroplasty. Please adjust her physical therapy to be done under a very slow and steady and gradual advance of range of motion and gradual use of the right upper extremity within her pain tolerance. Please call me for any additional questions  RED FLAGS: None   WEIGHT BEARING RESTRICTIONS: No  FALLS:  Has patient fallen in last 6 months? Yes. Number of falls a week and a half ago, feel onto buttock while pulling weeds out of yard   LIVING ENVIRONMENT: Lives with: lives alone Lives in: House/apartment Stairs: No Has following equipment at home: Single point  cane, shower chair, Grab bars, and walking stick   OCCUPATION: Retired   PLOF: Independent with household mobility  with device and Leisure: watching tv, reading, walking her dog, social gatherings w/ friends to play card games   PATIENT GOALS: Get my shoulder to functioning, lifting shoulder, bearing weight   OBJECTIVE:  Note: Objective measures were completed at Evaluation unless otherwise noted.  DIAGNOSTIC FINDINGS:  09/20/2022: R shoulder XR  Results: Degenerative changes of the before meals and glenohumeral joints. No other acute findings.   PATIENT SURVEYS :  QuickDASH Score: 79.5 / 100 = 79.5 %  COGNITION: Overall cognitive status: Within functional limits for tasks assessed     SENSATION: WFL  POSTURE: Rounded shoulders, fwd head   UPPER EXTREMITY ROM:  LUE: Grossly WFL   Passive ROM Right eval R 11/02/23 R 11/08/23 R 11/27/23  R 11/30/23  R 01/22/24  Shoulder flexion Limited, p! Less than 90 (around 60) 84 90 120 120 128  Shoulder extension        Shoulder abduction Limited less than 90 74 scaption 80 scaption 100 100 110  Shoulder adduction        Shoulder internal rotation Limited p! 35 at 30 scaption    44  Shoulder external rotation Limitied p! 45 at 30 scaption 50  64 73 78  (Blank rows = not tested)  Active ROM R 11/30/23 R 01/09/24 R 01/22/24  Shoulder flexion 56 62 74  Shoulder extension  38 44  Shoulder abduction  60 67 scaption  Shoulder adduction     Shoulder internal rotation  61 - FIR to buttock 61 - FIR to 1 from SIJ  Shoulder external rotation  51 76   (Blank rows = not tested)   UPPER EXTREMITY MMT:  MMT Right eval Left eval R 11/30/23 R 01/09/24 L 01/09/24 R 01/22/24 L 01/22/24  Shoulder flexion  4- 2 2 4+ 2+ 5  Shoulder extension    4 4+ 4+ 5  Shoulder abduction  4+  2 4+ 2+ 4+  Shoulder adduction         Shoulder internal rotation  4-  4- 4 4+ 5  Shoulder external rotation  4- 3- 3+ 4- 4- 4  Middle trapezius         Lower trapezius          Elbow flexion  5       Elbow extension  4+       Wrist flexion         Wrist extension         Wrist ulnar deviation         Wrist radial deviation         Wrist pronation         Wrist supination         Grip strength (lbs)         (Blank rows = not tested)  11/30/23:  R shoulder flexion active to 56 degrees R shoulder ER 3-/5  SHOULDER SPECIAL TESTS: Not Performed   JOINT MOBILITY TESTING:  Not Performed   PALPATION:  TTP: R ant glenoid, collar bone area (tender, not painful)  TREATMENT DATE:   01/22/2024  THERAPEUTIC EXERCISE: To improve ROM and flexibility.  Demonstration, verbal and tactile cues throughout for technique.  Seated pulleys: Flexion & Scaption x 3 min each - emphasizing submax effort at ~25% effort with eccentric lowering, avoiding shoulder shrug and forcing painful ROM  PT assisted AAROM reinforcing avoidance of shoulder shrug and increase scapular activation for improved glenohumeral coordination of motion  THERAPEUTIC ACTIVITIES: To improve functional performance.  Demonstration, verbal and tactile cues throughout for technique.  R shoulder P/AAROM assessment UE MMT Goal assessment  SELF CARE:  Verbally reviewed current HEP clarifying recommended frequency for ongoing performance to maintain gains achieved with PT, continue to improve functional use of RUE, and prevent future decline in function.    01/18/24 THERAPEUTIC EXERCISE: To improve strength, endurance, ROM, and flexibility.  Demonstration, verbal and tactile cues throughout for technique.  Seated pulleys: Flexion & Scaption x 3 min each - emphasizing submax effort at ~25% effort with eccentric lowering, avoiding shoulder shrug and forcing painful ROM   THERAPEUTIC ACTIVITIES: To improve functional performance.  Demonstration, verbal and tactile cues throughout for  technique. R UE reach to 8 inch tall yoga block on counter x 10 flexion (about the length of her faucet)   R UE reach to 8 inch tall yoga block on counter x 10 scaption (about the length of her faucet) Standing scap retraction YTB x 10 Standing shld ext YTB x 10 S/L R shoulder ER no resistnce towel at side x 15 S/L R shoulder flexion no resistance x 10 Wall slide flexion AAROM not going beyond shoulder height x 5   01/15/24 THERAPEUTIC EXERCISE: To improve strength, endurance, ROM, and flexibility.  Demonstration, verbal and tactile cues throughout for technique.  Seated pulleys: Flexion & Scaption x 3 min each - emphasizing submax effort at ~25% effort with eccentric lowering, avoiding shoulder shrug and forcing painful ROM Seated R shoulder orange pball slides flexion, scaption   THERAPEUTIC ACTIVITIES: To improve functional performance.  Demonstration, verbal and tactile cues throughout for technique. R UE reach to 6 inch tall yoga block x 10 flexion (about the length of her faucet)   R UE reach to 6 inch tall yoga block x 10 scaption  Standing scap retraction YTB x 10 S/L R shoulder ER no resistnce towel at side x 10 S/L R shoulder flexion no resistance x 10   01/11/24 THERAPEUTIC EXERCISE: To improve strength, endurance, ROM, and flexibility.  Demonstration, verbal and tactile cues throughout for technique.  Seated pulleys: Flexion & Scaption x 3 min each - emphasizing submax effort at ~25% effort with eccentric lowering, avoiding shoulder shrug and forcing painful ROM THERAPEUTIC ACTIVITIES: To improve functional performance.  Demonstration, verbal and tactile cues throughout for technique. R UE reach to 6 inch tall yoga ball x 10 flexion 1lb  R UE reach to 6 inch tall yoga ball x 10 scaption 1lb Standing scap retraction YTB x 10 Standing shoulder ext YTB x 10 S/L R shoulder ER no resistnce towel at side x 10   01/08/24 THERAPEUTIC EXERCISE: To improve ROM and flexibility.   Demonstration, verbal and tactile cues throughout for technique.  Seated pulleys: Flexion & Scaption x 3 min each - emphasizing submax effort at ~25% effort with eccentric lowering, avoiding shoulder shrug and forcing painful ROM  MANUAL THERAPY: To promote normalized muscle tension, improved flexibility, improved joint mobility, increased ROM, and reduced pain utilizing joint mobilization, connective tissue massage, therapeutic massage, manual TP therapy, and myofascial release.  Supine gentle R shoulder oscillation and grade I-II inferior glide for pain relief/muscle relxation STM/DTM and manual TPR to R UT, LS, infraspinatus, teres group and subscapularis  THERAPEUTIC ACTIVITIES: To improve functional performance.  Demonstration, verbal and tactile cues throughout for technique. R shoulder ROM assessment UE MMT QuickDASH: 47.7 / 100 = 47.7 %  NEUROMUSCULAR RE-EDUCATION: To improve coordination, kinesthesia, and posture. Seated PT assisted AAROM/MWM in R shoulder flexion and scaption to reduce shoulder shrug and promote improved scapular and GH coordination   01/04/24 THERAPEUTIC EXERCISE: To improve strength, endurance, ROM, and flexibility.  Demonstration, verbal and tactile cues throughout for technique.  Seated pulleys: Flexion & Scaption x 3 min each - emphasizing submax effort at ~25% effort with eccentric lowering  S/L R shoulder ER AROM x 10 with 3 oz putty S/L R shoulder flexion AAROM x 10; AROM x 5- fatigued  THERAPEUTIC ACTIVITIES: To improve functional performance.  Demonstration, verbal and tactile cues throughout for technique.  Functional reaches R shoulder flexion with hand to 9 yoga block lifting 3 oz putty tub - performed to onset of fatigue/increased substitution (shoulder hike) Functional reaches R shoulder scaption with hand to 9 yoga block lifting 3 oz putty tub x 5 - fatigued  NEUROMUSCULAR RE-EDUCATION: To improve coordination and proprioception Standing YTB  scap retraction/depression + B shoulder row x 10   12/26/2023 THERAPEUTIC EXERCISE: To improve strength, endurance, ROM, and flexibility.  Demonstration, verbal and tactile cues throughout for technique.  Seated pulleys: Flexion & Scaption x 3 min each - emphasizing submax effort at ~25% effort with eccentric lowering  S/L R shoulder ER AROM x 5, lightly resisted AROM with 3 oz putty tub to onset of fatigue (~7 reps) S/L R shoulder flexion AA/AROM x 10  THERAPEUTIC ACTIVITIES: To improve functional performance.  Demonstration, verbal and tactile cues throughout for technique.  Functional reaches R shoulder flexion with hand to 9 yoga block lifting 3 oz putty tub - performed to onset of fatigue/increased substitution (shoulder hike) Functional reaches R shoulder scaption with hand to 9 yoga block lifting 3 oz putty tub - performed to onset of fatigue (feeling of clunking in shoulder)  NEUROMUSCULAR RE-EDUCATION: To improve coordination, kinesthesia, posture, and proprioception. Supine R shoulder rhythmic stabilization IR/ER with shoulder 30 degrees from trunk and elbow at 90 degrees on towel roll Supine R scapular punches with PT supporting arm lightly  Supine R shoulder CW/CCW circles x 10 each with PT supporting arm lightly Standing YTB scap retraction/depression + B shoulder extension stopping shy of neutral x 10 Standing YTB scap retraction/depression + B shoulder row x 10 Standing scapular press-ups leaning lightly on elevated hi/low table with elbows extended        12/28/23:  Seated for shoulder pulley, PROM on R, flex and scaption 2-3 min each Supine for retrograde massage, and gentle AAROM  Supine for various rhythmic stabilization ex: With towel roll under R elbow for shoulder IR/ER elbow 90 degrees, shoulder 30 degrees from trunk Scapular punches with therapist supporting arm lightly in supine PNF sword pulls from L ant hip to R shoulder scaption position, therapist providing  manual support and resistance.   Seated for playground ball squeeze, engaging B shoulder depression and small forward shoulder flexion Seated for manually assisted R UE horizontal adduction/ abduction , gentle, small range Seated rows with yellow theraband, small movements, pt able to maintain B shoulder depression with the rows Seated R triceps extension therapist providing manual resistance   12/26/2023 THERAPEUTIC EXERCISE: To  improve strength, endurance, ROM, and flexibility.  Demonstration, verbal and tactile cues throughout for technique.  Pulleys: Flexion & Scaption x 3 min each - emphasizing submax effort at ~25% effort with eccentric lowering  Gentle R UT and LS stretches x 30 each  MANUAL THERAPY: To promote normalized muscle tension, improved flexibility, improved joint mobility, increased ROM, and pain modulation utilizing connective tissue massage, therapeutic massage, and manual TP therapy.  STM/DTM and manual TPR to R UT and LS  THERAPEUTIC ACTIVITIES: To improve functional performance.  Demonstration, verbal and tactile cues throughout for technique.  R shoulder AAROM wall slides with slight support from L hand - cues to avoid shoulder shrug - fatigues quickly Functional reaches R shoulder flexion with hand to 14 box placed on counter - increased shoulder shrug observed, therefore reduced height to 9 stool on counter and performed in flexion and scaption to point of fatigue/increased substitution  NEUROMUSCULAR RE-EDUCATION: To improve coordination, kinesthesia, posture, proprioception, and amplitude of movement.  PT providing tactile cues with fingertips on medial border of scapula. Scap retraction + small ROM B shoulder row (emphasis on scapular movement/activation) 2 x 5 Scap retraction + small ROM B shoulder extension +emphasis on scapular movement/activation) 2 x 5   12/21/23  Therapeutic activity: UBE 3 min F, attempted backward but unable to avoid shrugging R  shoulder so discontinued Supine for gentle PROM all planes R shoulder  Supine with towel roll under humerus, yellow t band ER L with static positioning R shoulder, with B humerus against trunk, mass practice Side lying L for R shoulder ER with towel under axilla, mass practice, really cued verbally and manually for R shoulder depression to avoid impinging ant/superior GH jt Side lying L for R shoulder flexion, therapist assisted, manually depressed R scapula to achieve better movement pattern Standing for scaption , pt grasping sink and leaning posteriorly to distract R shoulder and flexing trunk to achieve gentle stretch into scaption Standing L forearm on counter for R shoulder extension, again emphasis on avoiding shrugging, engaging proper scapular retraction while moving R shoulder, advised pt to trial at home some this week.   12/14/2023 THERAPEUTIC EXERCISE: To improve ROM and flexibility.  Demonstration, verbal and tactile cues throughout for technique.  Pulleys: Flexion & Scaption x 3 min each - emphasizing submax effort at ~25% effort with eccentric lowering  Supine R should er flexion PROM with PT inhibiting UT/shoulder shrug and cues to minimize muscle activation/guarding - pain increased when pt attempting AAROM Supine R shoulder scaption P/AAROM - better tolerance today Supine R shoulder IR/ER P/AAROM in scapular plane S/L R shoulder flexion PROM progressing to AAROM within pain free ROM  MANUAL THERAPY: To promote normalized muscle tension, improved flexibility, improved joint mobility, increased ROM, pain modulation, and reduced pain utilizing joint mobilization, connective tissue massage, therapeutic massage, manual TP therapy, and myofascial release. Supine gentle R shoulder oscillation and grade I-II inferior glide for pain relief/muscle relxation R shoulder incisional scar massage Supine and S/L STM/DTM and gentle manual TPR to R infraspinatus and teres group  L S/L R scapular  mobilization  MODALITIES:  TENS unit to R shoulder complex - intensity to pt tolerance x 20 min + moist heat pack for pain relief and decreased muscle guarding  SELF CARE: Provided education on home TENS unit set-up and use and continued limited active R UE use until pain better controlled.    12/12/2023  THERAPEUTIC EXERCISE: To improve ROM and flexibility.  Demonstration, verbal and tactile cues  throughout for technique.  Pulleys: Flexion & Scaption x 3 min each - reviewed submax effort at ~25% effort with eccentric lowering  Supine R shoulder flexion and scaption P/AAROM with PT inhibiting UT/shoulder shrug   MANUAL THERAPY: To promote normalized muscle tension, improved flexibility, improved joint mobility, increased ROM, and pain modulation utilizing joint mobilization, connective tissue massage, therapeutic massage, and manual TP therapy.  Supine gentle R shoulder oscillation and grade I-II inferior glide for pain relief/muscle relxation Supine with towel roll supporting R post humerus, for gentle ROM R shoulder, with intermittent retrograde massage, STM/DTM and manual TPR to R UT, LS, pecs, subscapularis, teres group and infrapinatus  L S/L R scapular mobilization  MODALITIES:  TENS unit to R shoulder complex - intensity to pt tolerance x 20 min + moist heat pack for pain relief and decreased muscle guarding  SELF CARE: Provided education on pain management options.  Provided education on home TENS unit options including proper set up, precautions and recommended home frequency for use.   12/07/23:  Shoulder pulley flex, scaption, 2 min each.  The patient has been eccentrically lowering her R shoulder with the pulleys, advised her to reduce her engagement of musculature with eccentric lowering to 25% vs a maximal contraction that she has been utilizing. Supine with towel roll supporting R post humerus, for gentle ROM R shoulder, with intermittent retrograde massage.  Assisted R  scapular/serratus punches 10x Supine chest press with yard stick 10x Supine manually resisted isometrics for R shoulder ER 10 reps, 5 sec holds Side lying L for R shoulder isometric adduction, towel roll in axilla,  5 sec holds, 10 reps  Side lying L for R shoulder ER 10 reps , pt reported soreness so stopped Side lying for assisted R shoulder flexion 10 reps, therapist supported under R hand    11/30/23:  Reassessed for 10th visit progress report Progressed therex according to pts tolerance and to protocol, now 12 weeks post op Shoulder pulley x 3 min flexion and scaption, slow Supine for serratus punches, initially with therapist supporting R arm and then with yard stick Supine for chest presses with yard stick Supine L shoulder ER with yellow theraband, stabilizing, static position R shoulder Side lying L for R shoulder ER Side lying L for R shoulder flexion Updated HEP below   11/27/2023 THERAPEUTIC EXERCISE: To improve strength, endurance, ROM, and flexibility.  Demonstration, verbal and tactile cues throughout for technique.  Pulleys: Flexion & Scaption x 3 min each  Standing R shoulder flexion/scaption wall slides x 5 - AAROM required with L hand at elbow- limited today Standing R shoulder flexion counter slides with 2# weight on washcloth 2 x 10 Standing R shoulder scaption counter slides with 2# weight on washcloth 2 x 10 Seated hand clasped AAROM shoulder flexion x 5  NEUROMUSCULAR RE-EDUCATION: To improve coordination, kinesthesia, and posture. R shoulder flexion YTB reactive isometric step-outs x 10 (neutral shoulder with elbow flexed to 90) R shoulder extension YTB reactive isometric step-outs x 10 (neutral shoulder with elbow flexed to 90) - cues for scapular retraction activation to stabilize shoulder R shoulder IR YTB reactive isometric step-outs x 10 (neutral shoulder with elbow flexed to 90) R shoulder ER YTB reactive isometric step-outs x 10 (neutral shoulder with  elbow flexed to 90) - cues for scapular retraction activation to stabilize shoulder   11/23/2023 THERAPEUTIC EXERCISE: To improve strength, endurance, ROM, and flexibility.  Demonstration, verbal and tactile cues throughout for technique.  Pulleys: Flexion & Scaption  x 3 min each  Standing R shoulder flexion/scaption wall slides x 10 - AAROM required with L hand at elbow Standing R shoulder flexion counter slides with 2# weight on washcloth 2 x 10 Standing R shoulder scaption counter slides with 2# weight on washcloth 2 x 10 Seated B shoulder orange Pball flexion & scaption walk-out x 10 each (attempted initially on wall but deferred d/t excessive R shoulder shrugging  NEUROMUSCULAR RE-EDUCATION: To improve coordination, kinesthesia, and posture. R shoulder flexion YTB reactive isometric step-outs x 5 (neutral shoulder with elbow flexed to 90) R shoulder extension YTB reactive isometric step-outs x 5 (neutral shoulder with elbow flexed to 90) - cues for scapular retraction activation to stabilize shoulder R shoulder IR YTB reactive isometric step-outs x 5 (neutral shoulder with elbow flexed to 90) R shoulder ER YTB reactive isometric step-outs x 5 (neutral shoulder with elbow flexed to 90) - cues for scapular retraction activation to stabilize shoulder   11/21/2023  THERAPEUTIC EXERCISE: To improve strength, endurance, ROM, and flexibility.  Demonstration, verbal and tactile cues throughout for technique. Pulleys: Flexion & Scaption x 3 min each  Supine for gentle R shoulder PROM/stretching all planes Supine flexed elbow R shoulder flexion/scaption (uppercut) AA/AROM 2 x 10 - somewhat better tolerance for AROM but still slight guiding from PT initially with motion  MANUAL THERAPY: To promote normalized muscle tension, improved flexibility, improved joint mobility, and reduced pain utilizing connective tissue massage, therapeutic massage, manual TP therapy, and scar mobilization.  STM/XFM  to R shoulder surgical incision Supine gentle R shoulder oscillation and grade I-II inferior glide for pain relief/muscle relxation  NEUROMUSCULAR RE-EDUCATION: To improve coordination, kinesthesia, and posture. S/L R shoulder ER maintaining mini-pillow under axilla and R elbow at 90 degrees 2 x 10 - VC & TC for scapular engagement, avoiding shoulder shrug S/L R shoulder flexion - initially attempted with elbow straight but transitioned to R elbow at 90 degrees x 10  SELF CARE:  Provided education on sleeping position and use of pillow(s) to support R arm.    11/17/2023  THERAPEUTIC EXERCISE: To improve ROM and flexibility.  Demonstration, verbal and tactile cues throughout for technique.  Pulleys: Flexion & Scaption x 3 min each  Supine for gentle R shoulder PROM/stretching all planes Supine flexed elbow R shoulder flexion/scaption (uppercut) AA/AROM 2 x 10 - AAROM d/t increased discomfort with AROM  MANUAL THERAPY: To promote normalized muscle tension, improved flexibility, improved joint mobility, and reduced pain utilizing connective tissue massage, therapeutic massage, manual TP therapy, and scar mobilization.  STM/XFM to R shoulder surgical incision STM/DTM and manual TPR to R pecs  NEUROMUSCULAR RE-EDUCATION: To improve coordination, kinesthesia, and posture. S/L R shoulder ER maintaining towel roll under axilla and R elbow at 90 degrees 2 x 10 - VC & TC for scapular engagement S/L R shoulder flexion - initially attempted with elbow straight but transitioned to R elbow at 90 degrees x 10 Seated YTB scap retraction + B shoulder row x 10 Seated YTB scap retraction + B shoulder extension stopping shy of neutral x 10   11/14/23: Pulleys 3 min flexion B Supine for gentle stretching R shoulder all planes Supine with R humerus supported in line with trunk for manually resisted R shoulder isometric inv/ev mass practice Side lying L for R shoulder ER maintaining towel roll under axilla  and R elbow at 90 degrees Standing with R elbow supported on counter, pulling towel with 6# on towel from flexed position to neutral, then  advance to sitting in chair for yellow t band rows, needed frequent cues to avoid R shoulder extension past trunk, had pt slide back in chair so that the seat back blocked her from extending R shoulder    11/08/2023  THERAPEUTIC EXERCISE: To improve ROM and flexibility.  Demonstration, verbal and tactile cues throughout for technique.  Pulleys: Flexion x 3 min, Scaption x 3 min (scaption better tolerated today) Standing counter wash AAROM LUE: Flexion x 20 Scaption x 20 R shoulder PROM within pain tolerance for flexion, scaption, IR and ER Measured PROM Shoulder isometrics: flex, abd, ER, ext 5x5 Standing YTB scap retraction + B shoulder row- clicking clicking at first but after readjusting no clicking  Standing YTB scap retraction + B shoulder extension stopping shy of neutral x 10   11/06/2023  THERAPEUTIC EXERCISE: To improve ROM and flexibility.  Demonstration, verbal and tactile cues throughout for technique.  Pulleys: Flexion x 3 min, Scaption x 3 min (scaption better tolerated today) Seated R shoulder AAROM with Swiffer: Flexion 2 x 10 Scaption 2 x 10 R shoulder PROM by PT within pain tolerance for flexion, scaption, IR and ER  Hooklying R shoulder AAROM with cane: Flexion 2 x 10 Scaption x 10 - pt needing PT guidance/assistance for proper movement pattern  NEUROMUSCULAR RE-EDUCATION: To improve coordination, kinesthesia, posture, and proprioception. R shoulder rhythmic stabilization at 90 flexion (1-finger perturbations) 2 x 15-20 sec R shoulder protraction AAROM with wand 2 x 10 Standing YTB scap retraction + B shoulder row x 10 Standing YTB scap retraction + B shoulder extension stopping shy of neutral x 10   11/02/2023  THERAPEUTIC EXERCISE: To improve strength, endurance, ROM, and flexibility.  Demonstration, verbal and tactile  cues throughout for technique.  Pulleys: Flexion x 3 min, Scaption x 1.5 min (discontinued d/t increasing discomfort with scaption) Table slides standing at counter top:  Flexion 2 x 10 Scaption 2 x 10 Seated R shoulder AAROM with Swiffer: Flexion 2 x 10 Scaption 2 x 10 R shoulder PROM by PT within pain tolerance for flexion, scaption, IR and ER (ER limited to ~45 per protocol) Standing R shoulder submax (~25% effort) isometrics into wall 5 x 5 - flexion, abduction, extension, IR & ER Standing scapular retraction 10 x 5  SELF CARE:  Yellow Theraputty provided for home grip strengthening   10/30/2023 SELF CARE:  Reviewed eval findings and role of PT in addressing identified deficits as well as instruction in initial HEP (see below).    PATIENT EDUCATION:  Education details: HEP review and cautioned patient to adjust reps/set to avoid increased pain and pushing past point of fatigue/loss of control  Person educated: Patient Education method: Explanation, Demonstration, Verbal cues, and Tactile cues Education comprehension: verbalized understanding, returned demonstration, verbal cues required, tactile cues required, and needs further education  HOME EXERCISE PROGRAM: Access Code: [email or web address] URL: [email or web address] Date: 01/18/2024 Prepared by: Braylin Clark  Exercises - Sidelying Shoulder External Rotation  - 1 x daily - 3 x weekly - 3 sets - 10 reps - Standing Bilateral Low Shoulder Row with Anchored Resistance  - 1 x daily - 7 x weekly - 2 sets - 10 reps - Shoulder Extension with Anchored Resistance  - 1 x daily - 7 x weekly - 2 sets - 10 reps - Shoulder Flexion Wall Slide with Towel  - 1 x daily - 7 x weekly - 2 sets - 5 reps ** helping with LUE and not going beyond shoulder height - Shoulder  Scar Massage  - 1-2 x daily - 7 x weekly - 1-2 min hold ** includes forward reaching exercises into flexion, scaption about the length of her  faucet   ASSESSMENT:  CLINICAL IMPRESSION: Jurline reports no issues with the revised/consolidated HEP established last visit and we discussed continued self progression reinforcing avoiding over performance of exercise or overuse of RUE with daily activities to prevent recurrence of painful flareups and loss of function.  She notes slow improvement with functional use of RUE noting increased ability to wash her hair as well as more consistent ability to reach her faucet without AAROM.  Full expected PROM following reverse TSA available, however patient continues to have limited AROM control for available ROM.  MMT limited by lack of full AROM, however patient able to demonstrate >/= 3+/5 strength or better for available ROM, although officially shoulder flexion and scaption 2+/5 due to limited AROM.  All PT goals now at least met or partially met and Megann feels ready to try transitioning to her HEP but would like to remain on hold for 30 days in the event that issues arise that would necessitate a return to PT.   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 Pt reports that she has orders to limit R arm use to waist level only, avoid reaching overhead at this time. She is having difficulty with forward and overhead reaching for items, opening cans w/ R hand, washing hair, and driving. She states that she has learned ways to compensate using her left arm even though she is R hand dominant. Today's assessment was done with all items remaining within patient's tolerance. PROM revealed limitations in flexion, IR, ER, and abd as movement was limited by both pain and muscular guarding. Pt had difficulty fully relaxing the R arm for PROM today and displayed hesitancy to movement beyond waist side. Pt was educated on the importance of taking recovery slow at this point (as  advised) and avoid heavy lifting and resistance training after asking to try bicep curls with a small weight at home. Pt is aware that at this stage, it is important to focus of restoring as much ROM as she can and gradually progressing towards more strengthening activities with therapy. Pt scored a 79.5% on the QuickDASH representing severe disability of the UE in day to day activities.  Venisha will benefit from skilled PT intervention to address current deficits to improve functional ability, mobility, and activity tolerance w/ dec'd pain interference.   OBJECTIVE IMPAIRMENTS: decreased activity tolerance, decreased endurance, decreased knowledge of condition, decreased mobility, decreased ROM, decreased strength, increased fascial restrictions, impaired perceived functional ability, increased muscle spasms, impaired UE functional use, postural dysfunction, and pain.   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   REHAB POTENTIAL: Good  CLINICAL DECISION MAKING: Evolving/moderate complexity  EVALUATION COMPLEXITY: Moderate  GOALS: Goals reviewed with patient? Yes  SHORT TERM GOALS: Target date: 12/11/2023    Patient will be independent with  initial HEP.  Baseline: Goal status: MET - 11/16/23  2.  Patient will report at least 25% improvement in R shoulder pain to improve QoL. Baseline: Worst 5-7/10 11/17/23 - pt noting shoulder more irritable over past week 12/12/23 - R shoulder more  irritable (3-4/10) Goal status: MET - 01/02/24 - pain better controlled with minimal to no pain over past 2 weeks  3.  Patient will improve R shoulder flexion PROM to 90 or better to aid in difficulty with forward reaching activities.  Baseline: limited by pain ~60 deg of flexion  Goal status: MET - 11/08/23 - no pain  4.  Patient will decrease her QuickDASH score by at least 15% to decrease severity of disability.  Baseline: 79.5% Goal status:  MET - 11/30/23 - QuickDASH Score: 45.5 / 100 = 45.5 %  LONG TERM GOALS: Target date: 01/22/2024  Patient will be independent with advanced HEP.  Baseline:  12/26/23 - cautioned pt to ease back into shoulder pulleys & scapular exercises with HEP, avoiding increased pain and being aware of fatigue and adjusting reps/sets accordingly 01/18/24 - refined HEP, patient doing better, not flaring up as before Goal status: MET - 01/22/24  2.  Patient will decrease her QuickDASH score to by at least 25-30% to improve function and activity tolerance d/t disability. Baseline: 79.5% 11/30/23: 45.5 / 100 = 45.5 % Goal status: MET - 01/09/24 - 47.7 / 100 = 47.7 %  3.  Patient will be able to perform overhead activities such as washing hair, retrieving items from cabinets to improve independent function at home. Baseline: pt is having difficulty with above activities  11/30/23: progressing not met 01/18/24: progressed but still difficult to reach into cabinet Goal status: PARTIALLY MET - Pt reports improved ability to reach the faucet at her sink and reach up to wash her hair; still limited reaching to cabinets  4.  Patient will report at least 50% improvement in R shoulder pain to improve QoL.  Baseline: 5-7/10 11/30/23:  0 at rest, up to 3 with use , met so far 12/14/23: pain has been increased over past few visits (3-4/10 today) 01/02/24: pain better controlled with minimal to no pain over past 2 weeks 01/09/24: pt reports 50% improvement overall but currently  experiencing another flare-up of unknown cause Goal status: MET - 01/22/24 - Pt reports 70-80% improvement in pain  5.  Patient will obtain at least 3+/5 MMT of UE for available range to increase activity tolerance.   Baseline: Unable to assess 11/30/23: progressing 01/09/24 - met for IR, ER and extension Goal status: PARTIALLY MET - 01/22/24 - met for L shoulder & R shoulder extension, IR & ER; R shoulder flexion and scaption/abduction 3+/5 for available ROM but 2+/5 based on limited ROM   PLAN: PT FREQUENCY: 2x/week  PT DURATION: 12 weeks  PLANNED INTERVENTIONS: 97164- PT Re-evaluation, 97750- Physical Performance Testing, 97110-Therapeutic exercises, 97530- Therapeutic activity, 97112- Neuromuscular re-education, 97535- Self Care, 02859- Manual therapy, [id number]- Gait training, [phone]- Electrical stimulation (unattended), [id number]- Ultrasound, 02987- Traction (mechanical), 20560 (1-2 muscles), 20561 (3+ muscles)- Dry Needling, Patient/Family education, Taping, Joint mobilization, Cryotherapy, and Moist heat  PLAN FOR NEXT SESSION: transition to HEP + 30-day hold    Elijah CHRISTELLA Hidden, PT 01/22/2024, 11:16 PM

## 2024-01-25 DIAGNOSIS — G709 Myoneural disorder, unspecified: Secondary | ICD-10-CM | POA: Diagnosis not present

## 2024-01-25 DIAGNOSIS — G7001 Myasthenia gravis with (acute) exacerbation: Secondary | ICD-10-CM | POA: Diagnosis not present

## 2024-01-26 DIAGNOSIS — G7001 Myasthenia gravis with (acute) exacerbation: Secondary | ICD-10-CM | POA: Diagnosis not present

## 2024-01-26 DIAGNOSIS — G709 Myoneural disorder, unspecified: Secondary | ICD-10-CM | POA: Diagnosis not present

## 2024-03-01 ENCOUNTER — Other Ambulatory Visit: Payer: Self-pay | Admitting: Family Medicine

## 2024-03-01 ENCOUNTER — Encounter: Payer: Self-pay | Admitting: Family Medicine

## 2024-03-04 ENCOUNTER — Encounter: Payer: Self-pay | Admitting: Hematology & Oncology

## 2024-03-05 NOTE — Progress Notes (Unsigned)
  Healthcare at [facility] [address] [place], [place] [address] [phone]  Date:  03/07/2024   Name:  Faith Massey   DOB:  [birth year]   MRN:  [id number]  PCP:  Watt Harlene BROCKS, MD    Chief Complaint: No chief complaint on file.   History of Present Illness:  Faith Massey is a 78 y.o. very pleasant female patient who presents with the following:  Patient seen today with concern of a bump on her arm that she want me to look at. She has had some health issues over the last several years and wondered if this could be due to mold exposure in her home History of breast cancer, thymoma, pulmonary embolism, myasthenia gravis  Discussed the use of AI scribe software for clinical note transcription with the patient, who gave verbal consent to proceed.  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   Pyelonephritis 11/10/2022   Prediabetes 05/25/2022   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   S/P Robotic Assisted Right Video Thoracoscopy with resection of Thymus 04/25/2022   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 10/16/2012   Pneumonia    Pre-diabetes    Prediabetes 05/25/2022   Pyelonephritis 11/10/2022   Right knee injury 02/14/2017   S/P radiation therapy 05/09/2023   S/P Robotic Assisted Right Video Thoracoscopy with resection of Thymus 04/25/2022   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    Past Surgical History:  Procedure Laterality Date   ABDOMINAL HYSTERECTOMY  03/22/2003   BACK SURGERY  03/21/1998   BREAST SURGERY     reconstruction   colon susrgery      CYSTOSCOPY WITH STENT PLACEMENT N/A 02/08/2023   Procedure: POSSIBLE CYSTOSCOPY WITH URETERAL STENT PLACEMENT;  Surgeon: Alvaro Ricardo KATHEE Mickey.,  MD;  Location: [facility];  Service: Urology;  Laterality: N/A;   LEFT HEART CATH AND CORONARY ANGIOGRAPHY N/A 07/20/2023   Procedure: LEFT HEART CATH AND CORONARY ANGIOGRAPHY;  Surgeon: Verlin Lonni BIRCH, MD;  Location: [facility];  Service: Cardiovascular;  Laterality: N/A;   MASTECTOMY Bilateral 03/21/1993   RESECTION OF A THYMOMA     04-25-22   REVISION TOTAL SHOULDER TO REVERSE TOTAL SHOULDER Right 09/07/2023   Procedure: REVISION, REVERSE TOTAL ARTHROPLASTY, SHOULDER;  Surgeon: Melita Drivers, MD;  Location: [facility];  Service: Orthopedics;   Laterality: Right;  HEMI ARTHROPLASTY, BONEGRAFT GLENOID   right total shoulder      Social History[1]  Family History  Problem Relation Age of Onset   Stroke Mother    Hypertension Mother    Myasthenia gravis Mother    Hypertension Father    Colon cancer Neg Hx    Esophageal cancer Neg Hx     Allergies[2]  Medication list has been reviewed and updated.  Medications Ordered Prior to Encounter[3]  Review of Systems:  As per HPI- otherwise negative.   Physical Examination: There were no vitals filed for this visit. There were no vitals filed for this visit. There is no height or weight on file to calculate BMI. Ideal Body Weight:    ***  Assessment and Plan: No diagnosis found.  Assessment & Plan   Signed Harlene Schroeder, MD    [1]  Social History Tobacco Use   Smoking status: Never    Passive exposure: Never   Smokeless tobacco: Never  Vaping Use   Vaping status: Never Used  Substance Use Topics   Alcohol use: Not Currently    Alcohol/week: 1.0 standard drink of alcohol    Types: 1 Glasses of wine per week    Comment: occasional   very rare   Drug use: No  [2]  Allergies Allergen Reactions   Prednisone  Shortness Of Breath, Swelling, Palpitations, Dermatitis and Hypertension     Reports medication caused swelling and metallic taste in mouth.  [3]  Current Outpatient Medications on File Prior to Visit  Medication Sig Dispense Refill   acetaminophen  (TYLENOL ) 650 MG CR tablet Take 1,300 mg by mouth every 8 (eight) hours as needed for pain.     BLACK ELDERBERRY PO Take 1 capsule by mouth daily as needed (immune support).     Cholecalciferol (VITAMIN D -3) 5000 UNITS TABS Take 5,000 Units by mouth daily. (Patient not taking: Reported on 11/09/2023)     COLLAGEN PO Take 1 Scoop by mouth daily.     Desiccated Beef Liver POWD 4 capsules by Does not apply route daily.     ELIQUIS  2.5 MG TABS tablet TAKE ONE TABLET BY MOUTH TWICE A DAY 180 tablet 2    lisinopril  (ZESTRIL ) 20 MG tablet Take 1.5 tablets (30 mg total) by mouth daily. Take one tablet and a half, total of 30 mg daily. 135 tablet 1   Melatonin 10 MG CAPS Take 10 mg by mouth at bedtime.     Multiple Vitamins-Minerals (MULTIVITAMIN WITH MINERALS) tablet Take 1 tablet by mouth daily.     OCTAGAM 20 GM/200ML SOLN Inject 50 g into the vein. 03/29/2023 Takes 50 grams for 2 days every 3 weeks.     OCTAGAM 5 GM/50ML SOLN      OVER THE COUNTER MEDICATION Take 1 tablet by mouth in the morning and at bedtime. Candidase     Probiotic Product (PROBIOTIC PO) Take 1-2 capsules by mouth  daily.     pyridostigmine  (MESTINON ) 60 MG tablet Take 1 tablet (60 mg total) by mouth 4 (four) times daily as needed. 90 tablet 6   Spirulina POWD 4 capsules by Does not apply route daily.     traMADol  (ULTRAM ) 50 MG tablet Take 1 tablet (50 mg total) by mouth every 6 (six) hours as needed. (Patient not taking: Reported on 12/13/2023) 20 tablet 0   UNABLE TO FIND Take 0.25 tablets by mouth at bedtime. Med Name: A quarter of a CBD gummy daily at bedtime.     No current facility-administered medications on file prior to visit.

## 2024-03-07 ENCOUNTER — Encounter: Payer: Self-pay | Admitting: Family Medicine

## 2024-03-07 ENCOUNTER — Ambulatory Visit: Admitting: Family Medicine

## 2024-03-07 ENCOUNTER — Telehealth: Payer: Self-pay | Admitting: *Deleted

## 2024-03-07 VITALS — BP 110/74 | HR 100 | Ht <= 58 in | Wt 118.2 lb

## 2024-03-07 DIAGNOSIS — I10 Essential (primary) hypertension: Secondary | ICD-10-CM | POA: Diagnosis not present

## 2024-03-07 DIAGNOSIS — Z77120 Contact with and (suspected) exposure to mold (toxic): Secondary | ICD-10-CM | POA: Diagnosis not present

## 2024-03-07 DIAGNOSIS — R7989 Other specified abnormal findings of blood chemistry: Secondary | ICD-10-CM

## 2024-03-07 DIAGNOSIS — L821 Other seborrheic keratosis: Secondary | ICD-10-CM | POA: Diagnosis not present

## 2024-03-07 LAB — VITAMIN D 25 HYDROXY (VIT D DEFICIENCY, FRACTURES): VITD: 58.49 ng/mL (ref 30.00–100.00)

## 2024-03-07 NOTE — Patient Instructions (Addendum)
 I will be in touch with your mold testing results asap We froze off 2 benign skin lesions for you today- Seborrheic keratosis

## 2024-03-07 NOTE — Telephone Encounter (Signed)
 Order faxed to [phone], confirmation received.

## 2024-03-08 ENCOUNTER — Encounter: Payer: Self-pay | Admitting: Family Medicine

## 2024-03-08 LAB — ALLERGY PANEL 11, MOLD GROUP
Allergen, A. alternata, m6: <0.1 kU/L
Allergen, Mucor Racemosus, M4: <0.1 kU/L
Aspergillus fumigatus, m3: <0.1 kU/L
CLADOSPORIUM HERBARUM (M2) IGE: <0.1 kU/L
CLASS: 0
CLASS: 0
Candida Albicans: <0.1 kU/L
Class: 0
Class: 0
Class: 0

## 2024-03-08 LAB — INTERPRETATION:

## 2024-03-11 ENCOUNTER — Telehealth: Payer: Self-pay | Admitting: Neurology

## 2024-03-11 ENCOUNTER — Other Ambulatory Visit: Payer: Self-pay | Admitting: Neurology

## 2024-03-11 NOTE — Telephone Encounter (Signed)
 New Insurance  [employer]  Effective date  Jan 1  Member ID # [id number]    Pt also stated that her VM is full so if you call just send My chart message

## 2024-03-12 NOTE — Telephone Encounter (Signed)
 Received.  I called [employer] and they did have card via text.  He will try to send to us  for future reference.  If not will mychart pt to see if she can upload to [employer].

## 2024-03-12 NOTE — Telephone Encounter (Signed)
 Received.  To check in to scan in.

## 2024-03-12 NOTE — Telephone Encounter (Signed)
 Done per Valery at check in.

## 2024-03-26 ENCOUNTER — Encounter: Payer: Self-pay | Admitting: Hematology & Oncology

## 2024-03-28 NOTE — Telephone Encounter (Signed)
 [employer] handles this. I am calling them to inquire.

## 2024-03-28 NOTE — Telephone Encounter (Signed)
 Pt states she has been told that her new insurance company([employer] provider [phone]) they don't have an authorization for her scheduled IVIG this afternoon at 4 at her home.  Pt is asking for RN to look into and resolve.  Pt asked it be noted that she does not get voice mails.

## 2024-03-28 NOTE — Telephone Encounter (Signed)
 Spoke with [employer] [phone]. Was told their PA specialist is working on it now. They do have pt's new insurance on file.   I called the patient back. She had been on the phone with Francis ([employer]). She appreciated the call back and her questions were answered.

## 2024-04-09 NOTE — Telephone Encounter (Addendum)
 Received fax from Wilmore @ [employer] asking for us  to submit PA on Cover My Meds for Octagam. Key submitted to plan. I sent a fax back to [employer] confirming submission of PA. [phone]. Received a receipt of confirmation.

## 2024-04-09 NOTE — Telephone Encounter (Addendum)
 Approval received from insurance. Faxed letter to [employer] [phone]. Received a receipt of confirmation.

## 2024-04-16 ENCOUNTER — Ambulatory Visit (HOSPITAL_BASED_OUTPATIENT_CLINIC_OR_DEPARTMENT_OTHER)

## 2024-04-24 ENCOUNTER — Ambulatory Visit (HOSPITAL_BASED_OUTPATIENT_CLINIC_OR_DEPARTMENT_OTHER)

## 2024-05-01 ENCOUNTER — Ambulatory Visit (HOSPITAL_BASED_OUTPATIENT_CLINIC_OR_DEPARTMENT_OTHER)

## 2024-05-13 ENCOUNTER — Inpatient Hospital Stay: Admitting: Hematology & Oncology

## 2024-05-13 ENCOUNTER — Inpatient Hospital Stay

## 2024-05-14 ENCOUNTER — Inpatient Hospital Stay

## 2024-05-14 ENCOUNTER — Ambulatory Visit: Admitting: Hematology & Oncology

## 2024-05-20 ENCOUNTER — Ambulatory Visit: Admitting: Neurology

## 2024-06-05 ENCOUNTER — Ambulatory Visit: Admitting: Family Medicine

## 2024-07-16 ENCOUNTER — Ambulatory Visit
